# Patient Record
Sex: Female | Born: 1955 | Race: Black or African American | Hispanic: No | Smoking: Former smoker
Health system: Southern US, Community
[De-identification: ages and names within clinical notes are randomized; demographics above are authoritative.]

## PROBLEM LIST (undated history)

## (undated) DIAGNOSIS — Z9289 Personal history of other medical treatment: Secondary | ICD-10-CM

## (undated) DIAGNOSIS — H269 Unspecified cataract: Secondary | ICD-10-CM

## (undated) DIAGNOSIS — Z803 Family history of malignant neoplasm of breast: Secondary | ICD-10-CM

## (undated) DIAGNOSIS — I10 Essential (primary) hypertension: Secondary | ICD-10-CM

## (undated) DIAGNOSIS — Z972 Presence of dental prosthetic device (complete) (partial): Secondary | ICD-10-CM

## (undated) DIAGNOSIS — H409 Unspecified glaucoma: Secondary | ICD-10-CM

## (undated) DIAGNOSIS — Z923 Personal history of irradiation: Secondary | ICD-10-CM

## (undated) DIAGNOSIS — D649 Anemia, unspecified: Secondary | ICD-10-CM

## (undated) DIAGNOSIS — E7800 Pure hypercholesterolemia, unspecified: Secondary | ICD-10-CM

## (undated) DIAGNOSIS — D352 Benign neoplasm of pituitary gland: Secondary | ICD-10-CM

## (undated) DIAGNOSIS — Z9221 Personal history of antineoplastic chemotherapy: Secondary | ICD-10-CM

## (undated) DIAGNOSIS — C50919 Malignant neoplasm of unspecified site of unspecified female breast: Secondary | ICD-10-CM

## (undated) DIAGNOSIS — Z973 Presence of spectacles and contact lenses: Secondary | ICD-10-CM

## (undated) HISTORY — PX: TONSILLECTOMY: SUR1361

## (undated) HISTORY — DX: Malignant neoplasm of unspecified site of unspecified female breast: C50.919

## (undated) HISTORY — PX: MULTIPLE TOOTH EXTRACTIONS: SHX2053

## (undated) HISTORY — DX: Personal history of irradiation: Z92.3

## (undated) HISTORY — DX: Family history of malignant neoplasm of breast: Z80.3

## (undated) HISTORY — DX: Pure hypercholesterolemia, unspecified: E78.00

## (undated) HISTORY — PX: WISDOM TOOTH EXTRACTION: SHX21

## (undated) HISTORY — PX: PORT-A-CATH REMOVAL: SHX5289

## (undated) HISTORY — PX: BREAST EXCISIONAL BIOPSY: SUR124

## (undated) HISTORY — DX: Essential (primary) hypertension: I10

---
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
## 1991-09-09 HISTORY — PX: ABDOMINAL HYSTERECTOMY: SHX81

## 1999-08-16 ENCOUNTER — Encounter (INDEPENDENT_AMBULATORY_CARE_PROVIDER_SITE_OTHER): Payer: Self-pay | Admitting: *Deleted

## 1999-08-16 ENCOUNTER — Ambulatory Visit (HOSPITAL_BASED_OUTPATIENT_CLINIC_OR_DEPARTMENT_OTHER): Admission: RE | Admit: 1999-08-16 | Discharge: 1999-08-16 | Payer: Self-pay | Admitting: General Surgery

## 2000-03-16 ENCOUNTER — Encounter: Admission: RE | Admit: 2000-03-16 | Discharge: 2000-06-14 | Payer: Self-pay | Admitting: Family Medicine

## 2002-12-23 ENCOUNTER — Encounter: Admission: RE | Admit: 2002-12-23 | Discharge: 2002-12-23 | Payer: Self-pay | Admitting: Family Medicine

## 2002-12-23 ENCOUNTER — Encounter: Payer: Self-pay | Admitting: Family Medicine

## 2003-01-09 ENCOUNTER — Encounter: Admission: RE | Admit: 2003-01-09 | Discharge: 2003-01-09 | Payer: Self-pay | Admitting: Family Medicine

## 2003-01-09 ENCOUNTER — Encounter: Payer: Self-pay | Admitting: Family Medicine

## 2004-04-05 ENCOUNTER — Encounter: Admission: RE | Admit: 2004-04-05 | Discharge: 2004-04-05 | Payer: Self-pay | Admitting: Family Medicine

## 2004-09-08 HISTORY — PX: TRANSPHENOIDAL PITUITARY RESECTION: SHX2572

## 2005-06-26 ENCOUNTER — Encounter: Payer: Self-pay | Admitting: *Deleted

## 2005-06-27 ENCOUNTER — Encounter: Payer: Self-pay | Admitting: *Deleted

## 2005-09-05 ENCOUNTER — Encounter (INDEPENDENT_AMBULATORY_CARE_PROVIDER_SITE_OTHER): Payer: Self-pay | Admitting: *Deleted

## 2005-09-05 ENCOUNTER — Inpatient Hospital Stay (HOSPITAL_COMMUNITY): Admission: RE | Admit: 2005-09-05 | Discharge: 2005-09-10 | Payer: Self-pay | Admitting: Neurosurgery

## 2005-12-09 ENCOUNTER — Emergency Department (HOSPITAL_COMMUNITY): Admission: EM | Admit: 2005-12-09 | Discharge: 2005-12-10 | Payer: Self-pay | Admitting: Emergency Medicine

## 2007-06-02 ENCOUNTER — Encounter: Admission: RE | Admit: 2007-06-02 | Discharge: 2007-06-02 | Payer: Self-pay | Admitting: Family Medicine

## 2008-07-31 ENCOUNTER — Encounter: Admission: RE | Admit: 2008-07-31 | Discharge: 2008-07-31 | Payer: Self-pay | Admitting: Family Medicine

## 2009-08-07 ENCOUNTER — Encounter: Admission: RE | Admit: 2009-08-07 | Discharge: 2009-08-07 | Payer: Self-pay | Admitting: Family Medicine

## 2009-09-20 ENCOUNTER — Encounter: Admission: RE | Admit: 2009-09-20 | Discharge: 2009-09-20 | Payer: Self-pay | Admitting: Family Medicine

## 2010-08-08 ENCOUNTER — Encounter: Admission: RE | Admit: 2010-08-08 | Discharge: 2010-08-08 | Payer: Self-pay | Admitting: Family Medicine

## 2010-09-29 ENCOUNTER — Encounter: Payer: Self-pay | Admitting: Neurosurgery

## 2010-10-28 ENCOUNTER — Encounter: Payer: Self-pay | Admitting: Cardiovascular Disease

## 2010-10-28 DIAGNOSIS — E119 Type 2 diabetes mellitus without complications: Secondary | ICD-10-CM | POA: Insufficient documentation

## 2010-10-28 DIAGNOSIS — E785 Hyperlipidemia, unspecified: Secondary | ICD-10-CM | POA: Insufficient documentation

## 2010-10-28 DIAGNOSIS — I10 Essential (primary) hypertension: Secondary | ICD-10-CM | POA: Insufficient documentation

## 2010-12-05 ENCOUNTER — Institutional Professional Consult (permissible substitution): Payer: Self-pay | Admitting: Cardiovascular Disease

## 2011-01-24 NOTE — Op Note (Signed)
[facility]  Patient:    [name]                     MRN: [id number] Proc. Date: 08/16/99 Adm. Date:  [id number] Attending:  Glenna Fellows Tappan                           Operative Report  PREOPERATIVE DIAGNOSIS: Left breast mass.  POSTOPERATIVE DIAGNOSIS: Left breast mass.  PROCEDURE: Excisional biopsy, left breast mass.  SURGEON: Lorne Skeens. Hoxworth, M.D.  ANESTHESIA: Local with IV sedation.  INDICATIONS: Ms. [name] is a 55 year old black female who recently noted a mass n the medial left breast.  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  Excisional biopsy has been recommended and accepted.  The nature of the procedure, its indications and risks of bleeding and infection were discussed and understood preoperatively.  She is now brought to the operating room for this procedure.  DESCRIPTION OF PROCEDURE:  The patient was brought to the operating room and placed in supine position on the operating table.  IV sedation was administered.  The eft breast was sterilely prepped and draped.  Local anesthesia was used to infiltrate the skin and underlying soft tissue.  A curvilinear incision was made directly ver the mass and dissection was carried down through the subcutaneous tissue.  The ass was then completely sharply excised and grossly appeared to be a fibroadenoma.  Hemostasis was obtained with cautery and the wound was closed with a running subcuticular 4-0 monocryl and Steri-Strips.  Sponge, needle and instrument counts were correct.  A dry sterile dressing was applied and the patient was taken to he recovery room in good condition. DD:  08/16/99 TD:  08/18/99 Job: [id number]

## 2011-01-24 NOTE — H&P (Signed)
NAMEMAYLEY, [name]             ACCOUNT NO.:  [id number]   MEDICAL RECORD NO.:  [id number]          PATIENT TYPE:  INP   LOCATION:  2857                         FACILITY:  [facility]   PHYSICIAN:  Payton Doughty, M.D.      DATE OF BIRTH:  [birth year]   DATE OF ADMISSION:  09/05/2005  DATE OF DISCHARGE:                                HISTORY & PHYSICAL   ADMISSION DIAGNOSIS:  Pituitary tumor.   HISTORY:  This is a very nice 55 year old, right-handed black girl,  experiencing some blurriness of the left eye.  Intraocular pressure is up.  An MR demonstrated a fairly large pituitary mass eccentric to the left.  She  has not had an endocrine evaluation.  She did not have any endocrine  complaints.  She is admitted now for a trans-sphenoidal pituitary resection.  Her prolactin was about 35.   PAST MEDICAL HISTORY:  Is otherwise benign.  She is controlling early  diabetes with diet.   MEDICATIONS:  1.  Zestoretic daily.  2.  Lipitor.   ALLERGIES:  No known drug allergies.   PAST SURGICAL HISTORY:  A hysterectomy 10 years ago.   SOCIAL HISTORY:  She does not smoke.  Drinks on a limited social basis.  She  is a Librarian, academic.   FAMILY HISTORY:  Mom is age 29, in good health with hypertension and type 2  diabetes.  Dad is ae 56, with hypertension.   REVIEW OF SYSTEMS:  Remarkable for glasses.   PHYSICAL EXAMINATION:  HEENT:  Within normal limits.  NECK:  She has a reasonable range of motion in her neck.  CHEST:  Clear.  HEART:  A regular rate and rhythm.  ABDOMEN:  Nontender.  No hepatosplenomegaly.  EXTREMITIES:  Without clubbing or cyanosis.  Peripheral pulses are good.  GENITOURINARY:  Exam is deferred.  NEUROLOGIC:  She is awake, alert and oriented and very pleasant.  Her visual  fields are full to confrontational testing.  Pupils equal, round, reactive  to light.  Extraocular movements are intact.  Facial movement and sensation  intact.  Tongue protrudes in the midline.   Shoulder shrug is normal.  Describes some swallowing difficulties.  Motor exam is full with no pronator  drift.  Finger-to-nose-to-finger and finger apposition are performed  satisfactorily.  Gait is normal.   MR shows a somewhat heterogeneously-enhancing mass centered in the sella and  extending to the supra-sellar region off to the left side.   CLINICAL IMPRESSION:  Likely a pituitary tumor.   PLAN:  A trans-sphenoidal resection of the pituitary tumor.  The risks and  benefits of this approach have been discussed with her and she wishes to  proceed.           ______________________________  Payton Doughty, M.D.     MWR/MEDQ  D:  09/05/2005  T:  09/05/2005  Job:  [id number]

## 2011-01-24 NOTE — Op Note (Signed)
[name], [name]             ACCOUNT NO.:  [id number]   MEDICAL RECORD NO.:  [id number]          PATIENT TYPE:  INP   LOCATION:  2857                         FACILITY:  [facility]   PHYSICIAN:  Payton Doughty, M.D.      DATE OF BIRTH:  [birth year]   DATE OF PROCEDURE:  09/05/2005  DATE OF DISCHARGE:                                 OPERATIVE REPORT   PREOPERATIVE DIAGNOSIS:  Pituitary tumor.   POSTOPERATIVE DIAGNOSIS:  Pituitary tumor.   OPERATIVE PROCEDURE:  Trans-sphenoidal pituitary tumor resection.   SURGEON:  Payton Doughty, M.D.   SERVICE:  Neurosurgery.   ANESTHESIA:  General endotracheal anesthesia.   PREPARATION:  Betadine scrub and alcohol wipe.   BODY OF TEXT:  This is a 55 year old black female with a pituitary tumor,  taken to the operating room, smoothly anesthetized and intubated, placed in  the supine position with the head in the [place] head holder.  Dr. Pollyann Kennedy of  ENT performed the trans-nasal approach to the sella and he will dictate that  under separate cover.  The operating microscope was brought in.  The sella  was immediately obvious.  The bone in the sella was removed with a curet.  This exposed the dura of the pituitary.  It was opened after coagulation and  immediately obvious was a very large soft tumor.  It was suctioned without  difficulty.  Numerous pieces were sent for permanent section.  Gentle  curettage along the surface loosened the tumor and it was removed.  The  diaphragmatic sella was seen in apposition and the final bit was removed.  The normal gland was not seen.  The wound was irrigated, hemostasis was  assured, and closed.           ______________________________  Payton Doughty, M.D.     MWR/MEDQ  D:  09/05/2005  T:  09/05/2005  Job:  [id number]

## 2011-01-24 NOTE — Op Note (Signed)
[name], [name]             ACCOUNT NO.:  [id number]   MEDICAL RECORD NO.:  [id number]          PATIENT TYPE:  INP   LOCATION:  2857                         FACILITY:  [facility]   PHYSICIAN:  Jefry H. Pollyann Kennedy, MD     DATE OF BIRTH:  1956-02-11   DATE OF PROCEDURE:  09/05/2005  DATE OF DISCHARGE:                                 OPERATIVE REPORT   PREOPERATIVE DIAGNOSIS:  Pituitary tumor.   POSTOPERATIVE DIAGNOSIS:  Pituitary tumor.   PROCEDURE:  Transsphenoidal approach to the pituitary in conjunction with  Dr. Trey Sailors of neurosurgery for resection of tumor.   HISTORY:  A 55 year old lady who was found recently on imaging to have an  incidental pituitary tumor that was fairly large. Her main complaint at the  time was visual blurriness. Risks, benefits, alternatives and complications  of this portion of the procedure were explained to the patient who seemed to  understand and agreed to surgery.   PROCEDURE:  The patient was taken to the operating room, placed on the  operating table in spine position. Following induction of general  endotracheal anesthesia, the head was positioned with neurosurgical halo  pins and prepped and draped in standard fashion. Oxymetazoline spray was  used preoperatively in nasal cavities. Xylocaine 1% with epinephrine was  infiltrated into the septum and the columella. Afrin soaked pledgets were  placed in the nasal cavities bilaterally. A columellar incision was created  with a #11 scalpel and connected into the left nasal cavity to a  hemitransfixion incision. Mucoperichondrial flap was then developed  posteriorly to the sphenoid rostrum. The bony cartilaginous junction was  divided and the cartilaginous septum was dislocated from the maxillary crest  and brought over to the right side in conjunction with the columella. The  mucosal flap was then developed on the right side of the septal bone. The  entire septal bone was resected back to the space  of the sphenoid. A 4 mm  osteotome was used to open into the center of the sphenoid sinus. Up-biting  and down-biting Kerrison rongeurs were then used to enlarge the sphenoid  sinus to give good visualization of the base of the sella. The intrasinus  septum was deviated severely over to the right side and this was followed  all the way back to the posterior wall of the sinus. The C-arm was brought  in and used to evaluate the adequacy of the exposure which was felt to be  good. Microscope was brought into view and Dr. Channing Mutters performed his portion of  the procedure. Following that, a fragment of septal bone was used to  reconstruct the defect in the posterior wall of the sphenoid. The sphenoid  was then packed with abdominal fat that was harvested through a separate  incision. The septal incisions were reapproximated with 3-0 chromic suture.  The columellar incision was reapproximated with interrupted 6-0 nylon. The  septal flaps were quilted with plain gut 4-0. Nasal cavities were packed  with rolled up Telfa coated with  bacitracin ointment. Pharynx was suctioned of blood and secretions. The  abdominal incision was reapproximated  with subcuticular Vicryl sutures and  then Steri-Strips were applied on Benzoin. The patient was then awakened  from anesthesia, extubated and transferred to recovery in stable condition.      Jefry H. Pollyann Kennedy, MD  Electronically Signed     JHR/MEDQ  D:  09/05/2005  T:  09/05/2005  Job:  [id number]   cc:   Talmadge Coventry, M.D.  Fax: [phone]

## 2011-01-24 NOTE — Discharge Summary (Signed)
[name], KE             ACCOUNT NO.:  [id number]   MEDICAL RECORD NO.:  [id number]          PATIENT TYPE:  INP   LOCATION:  3013                         FACILITY:  [facility]   PHYSICIAN:  Payton Doughty, M.D.      DATE OF BIRTH:  02-May-1956   DATE OF ADMISSION:  09/05/2005  DATE OF DISCHARGE:                                 DISCHARGE SUMMARY   ADMITTING DIAGNOSIS:  Pituitary tumor.   DISCHARGE DIAGNOSIS:  Pituitary tumor.   OPERATIVE PROCEDURE:  Transsphenoidal pituitary tumor resection.   SERVICE:  Neurosurgery.   COMPLICATIONS:  None.   DISCHARGING STATUS:  Alive and well.   The 56 year old right-handed black lady's history and physical is recounted  in the chart. She had a pituitary tumor discovered after intraocular  pressures were observed to be elevated. General exam is intact, neurologic  exam is intact, visual fields are full. She was admitted after ascertaining  normal laboratory values and underwent a transsphenoidal pituitary tumor  resection. Postoperatively she has been hypertensive and was controlled with  labetalol and hydralazine. She got one dose of hydrochlorothiazide which did  result in lowering of her blood pressure. However, because of concerns of  confusing the diuresis with diabetes insipidus, it was stopped. Currently  her blood pressure is slightly elevated. She is awake, alert, and oriented.  Her cranial nerves are intact, vision is full. She is going to be discharged  home on hydrocortisone 10 mg b.i.d., Vicodin on a p.r.n. basis, and  hydrochlorothiazide as well as lisinopril for her blood pressure. She is  supposed to follow up with Dr. Talmage Nap on January 19. She will be back in my  office in about 2 weeks.           ______________________________  Payton Doughty, M.D.     MWR/MEDQ  D:  09/10/2005  T:  09/10/2005  Job:  [id number]

## 2011-08-13 ENCOUNTER — Other Ambulatory Visit: Payer: Self-pay | Admitting: Family Medicine

## 2011-08-13 DIAGNOSIS — Z1231 Encounter for screening mammogram for malignant neoplasm of breast: Secondary | ICD-10-CM

## 2011-08-27 ENCOUNTER — Ambulatory Visit
Admission: RE | Admit: 2011-08-27 | Discharge: 2011-08-27 | Disposition: A | Discharge status: Home / Self Care | Payer: BC Managed Care – PPO | Source: Ambulatory Visit | Attending: Family Medicine | Admitting: Family Medicine

## 2011-08-27 DIAGNOSIS — Z1231 Encounter for screening mammogram for malignant neoplasm of breast: Secondary | ICD-10-CM

## 2011-10-22 DIAGNOSIS — E119 Type 2 diabetes mellitus without complications: Secondary | ICD-10-CM | POA: Insufficient documentation

## 2011-10-22 DIAGNOSIS — E785 Hyperlipidemia, unspecified: Secondary | ICD-10-CM | POA: Insufficient documentation

## 2011-10-22 DIAGNOSIS — I10 Essential (primary) hypertension: Secondary | ICD-10-CM | POA: Insufficient documentation

## 2012-08-26 ENCOUNTER — Other Ambulatory Visit: Payer: Self-pay | Admitting: Family Medicine

## 2012-08-26 DIAGNOSIS — Z1231 Encounter for screening mammogram for malignant neoplasm of breast: Secondary | ICD-10-CM

## 2012-10-01 ENCOUNTER — Ambulatory Visit
Admission: RE | Admit: 2012-10-01 | Discharge: 2012-10-01 | Disposition: A | Discharge status: Home / Self Care | Payer: BC Managed Care – PPO | Source: Ambulatory Visit | Attending: Family Medicine | Admitting: Family Medicine

## 2012-10-01 DIAGNOSIS — Z1231 Encounter for screening mammogram for malignant neoplasm of breast: Secondary | ICD-10-CM

## 2013-09-08 DIAGNOSIS — Z9289 Personal history of other medical treatment: Secondary | ICD-10-CM

## 2013-09-08 HISTORY — DX: Personal history of other medical treatment: Z92.89

## 2013-10-07 ENCOUNTER — Other Ambulatory Visit: Payer: Self-pay

## 2013-10-07 DIAGNOSIS — Z1231 Encounter for screening mammogram for malignant neoplasm of breast: Secondary | ICD-10-CM

## 2013-10-21 ENCOUNTER — Ambulatory Visit: Payer: BC Managed Care – PPO

## 2013-11-04 ENCOUNTER — Ambulatory Visit: Payer: BC Managed Care – PPO

## 2013-11-22 ENCOUNTER — Ambulatory Visit: Payer: BC Managed Care – PPO

## 2013-12-08 ENCOUNTER — Ambulatory Visit
Admission: RE | Admit: 2013-12-08 | Discharge: 2013-12-08 | Disposition: A | Discharge status: Home / Self Care | Payer: BC Managed Care – PPO | Source: Ambulatory Visit

## 2013-12-08 DIAGNOSIS — Z1231 Encounter for screening mammogram for malignant neoplasm of breast: Secondary | ICD-10-CM

## 2013-12-09 ENCOUNTER — Other Ambulatory Visit: Payer: Self-pay | Admitting: Family Medicine

## 2013-12-09 DIAGNOSIS — R928 Other abnormal and inconclusive findings on diagnostic imaging of breast: Secondary | ICD-10-CM

## 2013-12-20 ENCOUNTER — Other Ambulatory Visit: Payer: Self-pay | Admitting: Family Medicine

## 2013-12-20 ENCOUNTER — Ambulatory Visit
Admission: RE | Admit: 2013-12-20 | Discharge: 2013-12-20 | Disposition: A | Discharge status: Home / Self Care | Payer: BC Managed Care – PPO | Source: Ambulatory Visit | Attending: Family Medicine | Admitting: Family Medicine

## 2013-12-20 DIAGNOSIS — R928 Other abnormal and inconclusive findings on diagnostic imaging of breast: Secondary | ICD-10-CM

## 2013-12-29 ENCOUNTER — Other Ambulatory Visit: Payer: Self-pay | Admitting: Family Medicine

## 2013-12-29 ENCOUNTER — Encounter (INDEPENDENT_AMBULATORY_CARE_PROVIDER_SITE_OTHER): Payer: Self-pay

## 2013-12-29 ENCOUNTER — Ambulatory Visit
Admission: RE | Admit: 2013-12-29 | Discharge: 2013-12-29 | Disposition: A | Discharge status: Home / Self Care | Payer: BC Managed Care – PPO | Source: Ambulatory Visit | Attending: Family Medicine | Admitting: Family Medicine

## 2013-12-29 ENCOUNTER — Other Ambulatory Visit: Payer: BC Managed Care – PPO

## 2013-12-29 DIAGNOSIS — C50919 Malignant neoplasm of unspecified site of unspecified female breast: Secondary | ICD-10-CM

## 2013-12-29 DIAGNOSIS — R928 Other abnormal and inconclusive findings on diagnostic imaging of breast: Secondary | ICD-10-CM

## 2013-12-29 HISTORY — PX: BREAST BIOPSY: SHX20

## 2013-12-29 HISTORY — DX: Malignant neoplasm of unspecified site of unspecified female breast: C50.919

## 2013-12-30 ENCOUNTER — Ambulatory Visit
Admission: RE | Admit: 2013-12-30 | Discharge: 2013-12-30 | Disposition: A | Discharge status: Home / Self Care | Payer: BC Managed Care – PPO | Source: Ambulatory Visit | Attending: Family Medicine | Admitting: Family Medicine

## 2013-12-30 DIAGNOSIS — R928 Other abnormal and inconclusive findings on diagnostic imaging of breast: Secondary | ICD-10-CM

## 2014-01-10 ENCOUNTER — Encounter (INDEPENDENT_AMBULATORY_CARE_PROVIDER_SITE_OTHER): Payer: Self-pay | Admitting: General Surgery

## 2014-01-10 ENCOUNTER — Ambulatory Visit (INDEPENDENT_AMBULATORY_CARE_PROVIDER_SITE_OTHER): Payer: BC Managed Care – PPO | Admitting: General Surgery

## 2014-01-10 VITALS — BP 138/82 | HR 80 | Temp 98.3°F | Resp 16 | Ht 65.0 in | Wt 241.0 lb

## 2014-01-10 DIAGNOSIS — C50519 Malignant neoplasm of lower-outer quadrant of unspecified female breast: Secondary | ICD-10-CM

## 2014-01-10 DIAGNOSIS — C50511 Malignant neoplasm of lower-outer quadrant of right female breast: Secondary | ICD-10-CM

## 2014-01-10 NOTE — Progress Notes (Signed)
Patient ID: [name], female   DOB: 27-Oct-1955, 58 y.o.   MRN: [id number]  Chief Complaint  Patient presents with  . eval rigth breast    HPI [name] is a 58 y.o. female.  Referred by Dr Lovey Newcomer HPI 51 yof who is has no prior breast history and underwent routine screening mammogram with right breast abnormality. She has also undergone ultrasound now and there is a 1.4 x 1.3 x 1.2 cm mass with no right axillary adenopathy.  She has undergone biopsy that shows grade III invasive ductal carcinoma, triple negative.  She reports no breast complaints.  She has family history in her mother at age 42 and an aunt in her 52s.    Past Medical History  Diagnosis Date  . HTN (hypertension)   . Diabetes mellitus   . High cholesterol     Past Surgical History  Procedure Laterality Date  . Abdominal hysterectomy    . Breast biopsy      LEFT  . Transphenoidal pituitary resection      Family History  Problem Relation Age of Onset  . Hypertension Mother   . Diabetes type II Mother   . Hypertension Father     Social History History  Substance Use Topics  . Smoking status: Former Smoker    Types: Cigarettes  . Smokeless tobacco: Not on file  . Alcohol Use: Yes    No Known Allergies  Current Outpatient Prescriptions  Medication Sig Dispense Refill  . amLODipine (NORVASC) 5 MG tablet       . Atorvastatin Calcium (LIPITOR PO) Take by mouth.        . glyBURIDE (DIABETA) 5 MG tablet Take 5 mg by mouth 2 (two) times daily.        Marland Kitchen lisinopril-hydrochlorothiazide (PRINZIDE,ZESTORETIC) 20-12.5 MG per tablet       . metFORMIN (GLUCOPHAGE-XR) 500 MG 24 hr tablet        No current facility-administered medications for this visit.    Review of Systems Review of Systems  Constitutional: Negative for fever, chills and unexpected weight change.  HENT: Negative for congestion, hearing loss, sore throat, trouble swallowing and voice change.   Eyes: Negative for visual  disturbance.  Respiratory: Negative for cough and wheezing.   Cardiovascular: Negative for chest pain, palpitations and leg swelling.  Gastrointestinal: Negative for nausea, vomiting, abdominal pain, diarrhea, constipation, blood in stool, abdominal distention and anal bleeding.  Genitourinary: Negative for hematuria, vaginal bleeding and difficulty urinating.  Musculoskeletal: Negative for arthralgias.  Skin: Negative for rash and wound.  Neurological: Negative for seizures, syncope and headaches.  Hematological: Negative for adenopathy. Does not bruise/bleed easily.  Psychiatric/Behavioral: Negative for confusion.    Blood pressure 138/82, pulse 80, temperature 98.3 F (36.8 C), resp. rate 16, height 5\' 5"  (1.651 m), weight 241 lb (109.317 kg).  Physical Exam Physical Exam  Vitals reviewed. Constitutional: She appears well-developed and well-nourished.  HENT:  Head: Normocephalic and atraumatic.  Eyes: No scleral icterus.  Cardiovascular: Normal rate, regular rhythm and normal heart sounds.   Pulmonary/Chest: Effort normal and breath sounds normal. She has no wheezes. She has no rales. Right breast exhibits no inverted nipple, no mass, no nipple discharge, no skin change and no tenderness. Left breast exhibits no inverted nipple, no mass, no nipple discharge, no skin change and no tenderness.    Lymphadenopathy:    She has no cervical adenopathy.    She has no axillary adenopathy.  Right: No supraclavicular adenopathy present.       Left: No supraclavicular adenopathy present.    Data Reviewed EXAM:  DIGITAL DIAGNOSTIC right MAMMOGRAM  ULTRASOUND right BREAST  COMPARISON: Prior exams  ACR Breast Density Category d: The breast tissue is extremely dense,  which lowers the sensitivity of mammography.  FINDINGS:  Additional views confirm the presence of a mildly irregular oval 1.3  cm right breast mass 6 o'clock location. This corresponds to the  questioned finding at  screening mammography.  On physical exam, I palpate no abnormality in the left breast 6  o'clock location.  Ultrasound is performed, showing an irregular shadowing hypoechoic  mass in the right breast 6 o'clock location 8 cm from the nipple  measuring 1.4 x 1.3 x 1.2 cm. No right axillary lymphadenopathy is  identified. This corresponds to the mammographic finding.  IMPRESSION:  Suspicious right breast mass, 6 o'clock location. Ultrasound-guided  biopsy will be scheduled at the patient's convenience and dictated  separately.   Assessment    Clinical stage I TNBC     Plan    Genetic testing, MRI ( I think reasonable given tnbc and possible primary systemic therapy), med onc appt  We discussed the staging and pathophysiology of breast cancer. We discussed all of the different options for treatment for breast cancer including surgery, chemotherapy, radiation therapy, Herceptin, and antiestrogen therapy. We discussed her pathology at length.  We discussed possibility of chemo first.  We discussed pros and cons of breast MR and have decided to proceed. Will also get her to see med onc up front and discuss at conference  We discussed a sentinel lymph node biopsy as she does not appear to having lymph node involvement right now. We discussed the performance of that with injection of radioactive tracer and blue dye. We discussed that she would have an incision underneath her axillary hairline if she undergoes lumpectomy. We discussed that there is a chance of having a positive node with a sentinel lymph node biopsy and we will await the permanent pathology to make any other first further decisions in terms of her treatment. One of these options might be to return to the operating room to perform an axillary lymph node dissection. We discussed up to a 5% risk lifetime of chronic shoulder pain as well as lymphedema associated with a sentinel lymph node biopsy.  We discussed the options for treatment of  the breast cancer which included lumpectomy versus a mastectomy. We discussed the performance of the lumpectomy with seed placement. We discussed a 10% chance of a positive margin requiring reexcision in the operating room. We also discussed that she may need radiation therapy or antiestrogen therapy or both if she undergoes lumpectomy. We discussed the mastectomy and the postoperative care for that as well. We discussed that there is no difference in her survival whether she undergoes lumpectomy with radiation therapy or antiestrogen therapy versus a mastectomy.  I do think she will need chemo so will wait for her to see med onc to discuss port placement.  Will see her back after above.         Rolm Bookbinder 01/10/2014, 3:56 PM

## 2014-01-11 ENCOUNTER — Telehealth: Payer: Self-pay | Admitting: *Deleted

## 2014-01-11 NOTE — Telephone Encounter (Signed)
Called pt and confirmed 01/12/14 genetic appt & 01/18/14 med onc appt.  Mailed before appt letter, welcoming packet & intake form to pt.  Emailed Anderson Malta, Lars Mage & Dr. Donne Hazel at [place] to make them aware.

## 2014-01-12 ENCOUNTER — Ambulatory Visit
Admission: RE | Admit: 2014-01-12 | Discharge: 2014-01-12 | Disposition: A | Discharge status: Home / Self Care | Payer: BC Managed Care – PPO | Source: Ambulatory Visit | Attending: General Surgery | Admitting: General Surgery

## 2014-01-12 ENCOUNTER — Encounter: Payer: Self-pay | Admitting: Genetic Counselor

## 2014-01-12 ENCOUNTER — Other Ambulatory Visit: Payer: BC Managed Care – PPO

## 2014-01-12 ENCOUNTER — Ambulatory Visit (HOSPITAL_BASED_OUTPATIENT_CLINIC_OR_DEPARTMENT_OTHER): Payer: BC Managed Care – PPO | Admitting: Genetic Counselor

## 2014-01-12 DIAGNOSIS — C50919 Malignant neoplasm of unspecified site of unspecified female breast: Secondary | ICD-10-CM

## 2014-01-12 DIAGNOSIS — Z809 Family history of malignant neoplasm, unspecified: Secondary | ICD-10-CM

## 2014-01-12 DIAGNOSIS — C50511 Malignant neoplasm of lower-outer quadrant of right female breast: Secondary | ICD-10-CM

## 2014-01-12 DIAGNOSIS — Z8041 Family history of malignant neoplasm of ovary: Secondary | ICD-10-CM

## 2014-01-12 DIAGNOSIS — Z803 Family history of malignant neoplasm of breast: Secondary | ICD-10-CM | POA: Insufficient documentation

## 2014-01-12 MED ORDER — GADOBENATE DIMEGLUMINE 529 MG/ML IV SOLN
20.0000 mL | Freq: Once | INTRAVENOUS | Status: AC | PRN
  Administered 2014-01-12: 20 mL via INTRAVENOUS

## 2014-01-12 NOTE — Progress Notes (Signed)
Patient Name: [name] Patient Age: 58 y.o. Encounter Date: 01/12/2014  Referring Physician: MATTHEW WAKEFIELD, MD  Primary Care Provider: BADGER,MICHAEL C, MD   Ms. [name], a 58 y.o. female, [facility] due to a personal and family history of breast cancer.  She presents to clinic today to discuss the possibility of a hereditary predisposition to cancer and discuss whether genetic testing is warranted.  HISTORY OF PRESENT ILLNESS: [name] was diagnosed with breast cancer (IDC) recently at the age of 58. The breast tumor was ER/PR/HER2 negative. She states she will likely be receiving chemotherapy in the neoadjuvant setting, but has not yet met with the medical oncologist.  She has no other history of cancer. She reports that she has never had a colonoscopy and has not seen a gynecologist for about 9 years. She had a TAH/BSO at age 36 due to endometriosis.  Past Medical History  Diagnosis Date  . HTN (hypertension)   . Diabetes mellitus   . High cholesterol   . Breast cancer   . Family history of malignant neoplasm of breast     History   Social History  . Marital Status: Widowed    Spouse Name: N/A    Number of Children: N/A  . Years of Education: N/A   Social History Main Topics  . Smoking status: Former Smoker    Types: Cigarettes  . Smokeless tobacco: Not on file  . Alcohol Use: Yes  . Drug Use: No  . Sexual Activity: Not on file   Other Topics Concern  . Not on file   Social History Narrative  . No narrative on file     FAMILY HISTORY:   During the visit, a 4-generation pedigree was obtained. Significant diagnoses include the following:  Family History  Problem Relation Age of Onset  . Hypertension Mother   . Diabetes type II Mother   . Breast cancer Mother     dx ~72; deceased 77  . Hypertension Father   . Breast cancer Maternal Aunt     deceased 42  . Cancer Maternal Uncle     unk. type;  deceased 70s  . Cancer Maternal Uncle     unk. type; deceased late 60s  . Ovarian cancer Cousin 49    daughter of an unaffected mat aunt who is 83    Of note, [name]'s mother was one of 12 children. Of her mother's 3 sisters, only one had breast cancer as noted above. She has numerous cousins and the one with reported ovarian cancer is related through an unaffected maternal aunt. [name]'s father was an only child.  [name]'s ancestry is African American. There is no known Jewish ancestry and no consanguinity.  ASSESSMENT AND PLAN: [name] is a 58 y.o. female with a personal and family history of breast cancer. This history is not highly suggestive of a hereditary predisposition to cancer given her very large family, however, genetic testing is recommended due to her triple negative breast cancer and that her father had no sisters. She also had a TAH/BSO at a young age which may play a role in age of onset for her. We reviewed the characteristics, features and inheritance patterns of hereditary cancer syndromes. We also discussed genetic testing, including the appropriate family members to test, the process of testing, insurance coverage and implications of results.   [name] wished to pursue genetic testing and a blood sample will be   sent to [facility] for analysis of 6 genes (BRCA1, BRCA2, CDH1, PALB2, PTEN and p53). We discussed the implications of a positive, negative and/ or Variant of Uncertain Significance (VUS) result. Results should be available in approximately 2 weeks for 5 of the genes and an additional 2 weeks for PALB2. We will call her with each result and address implications for her as well as address genetic testing for at-risk family members, if needed.    We encouraged [name] to remain in contact with [facility] annually so that we can update the family history and inform her of any changes in cancer genetics and testing that may  be of benefit for this family. Ms.  Owens questions were answered to her satisfaction today.   Thank you for the referral and allowing [place] to share in the care of your patient.   The patient was seen for a total of 30 minutes, greater than 50% of which was spent face-to-face counseling. This patient was discussed with the referring provider who agrees with the above.

## 2014-01-16 ENCOUNTER — Other Ambulatory Visit (INDEPENDENT_AMBULATORY_CARE_PROVIDER_SITE_OTHER): Payer: Self-pay | Admitting: General Surgery

## 2014-01-16 ENCOUNTER — Other Ambulatory Visit: Payer: Self-pay | Admitting: *Deleted

## 2014-01-16 DIAGNOSIS — C50519 Malignant neoplasm of lower-outer quadrant of unspecified female breast: Secondary | ICD-10-CM

## 2014-01-16 DIAGNOSIS — R928 Other abnormal and inconclusive findings on diagnostic imaging of breast: Secondary | ICD-10-CM

## 2014-01-17 ENCOUNTER — Encounter: Payer: Self-pay | Admitting: *Deleted

## 2014-01-17 NOTE — Progress Notes (Signed)
Completed chart, labs entered, added to spreadsheet & placed in Dr. Magrinat's box.  

## 2014-01-18 ENCOUNTER — Other Ambulatory Visit: Payer: BC Managed Care – PPO

## 2014-01-18 ENCOUNTER — Encounter: Payer: Self-pay | Admitting: Oncology

## 2014-01-18 ENCOUNTER — Other Ambulatory Visit: Payer: Self-pay | Admitting: *Deleted

## 2014-01-18 ENCOUNTER — Ambulatory Visit (HOSPITAL_BASED_OUTPATIENT_CLINIC_OR_DEPARTMENT_OTHER): Payer: BC Managed Care – PPO | Admitting: Oncology

## 2014-01-18 ENCOUNTER — Ambulatory Visit: Payer: BC Managed Care – PPO

## 2014-01-18 ENCOUNTER — Other Ambulatory Visit (HOSPITAL_BASED_OUTPATIENT_CLINIC_OR_DEPARTMENT_OTHER): Payer: BC Managed Care – PPO

## 2014-01-18 ENCOUNTER — Telehealth: Payer: Self-pay | Admitting: Oncology

## 2014-01-18 VITALS — BP 215/107 | HR 108 | Temp 98.4°F | Resp 20 | Ht 65.0 in | Wt 242.4 lb

## 2014-01-18 DIAGNOSIS — C50919 Malignant neoplasm of unspecified site of unspecified female breast: Secondary | ICD-10-CM

## 2014-01-18 DIAGNOSIS — C50519 Malignant neoplasm of lower-outer quadrant of unspecified female breast: Secondary | ICD-10-CM

## 2014-01-18 DIAGNOSIS — E7800 Pure hypercholesterolemia, unspecified: Secondary | ICD-10-CM

## 2014-01-18 DIAGNOSIS — Z170 Estrogen receptor positive status [ER+]: Secondary | ICD-10-CM

## 2014-01-18 DIAGNOSIS — I10 Essential (primary) hypertension: Secondary | ICD-10-CM

## 2014-01-18 DIAGNOSIS — E119 Type 2 diabetes mellitus without complications: Secondary | ICD-10-CM

## 2014-01-18 DIAGNOSIS — C50511 Malignant neoplasm of lower-outer quadrant of right female breast: Secondary | ICD-10-CM

## 2014-01-18 DIAGNOSIS — Z803 Family history of malignant neoplasm of breast: Secondary | ICD-10-CM

## 2014-01-18 DIAGNOSIS — Z87891 Personal history of nicotine dependence: Secondary | ICD-10-CM

## 2014-01-18 LAB — CBC WITH DIFFERENTIAL/PLATELET
BASO%: 0.3 % (ref 0.0–2.0)
Basophils Absolute: 0 10*3/uL (ref 0.0–0.1)
EOS%: 2.2 % (ref 0.0–7.0)
Eosinophils Absolute: 0.2 10*3/uL (ref 0.0–0.5)
HEMATOCRIT: 35 % (ref 34.8–46.6)
HGB: 11 g/dL — ABNORMAL LOW (ref 11.6–15.9)
LYMPH#: 2.1 10*3/uL (ref 0.9–3.3)
LYMPH%: 19.2 % (ref 14.0–49.7)
MCH: 26 pg (ref 25.1–34.0)
MCHC: 31.4 g/dL — ABNORMAL LOW (ref 31.5–36.0)
MCV: 82.7 fL (ref 79.5–101.0)
MONO#: 0.5 10*3/uL (ref 0.1–0.9)
MONO%: 5 % (ref 0.0–14.0)
NEUT#: 7.9 10*3/uL — ABNORMAL HIGH (ref 1.5–6.5)
NEUT%: 73.3 % (ref 38.4–76.8)
Platelets: 261 10*3/uL (ref 145–400)
RBC: 4.23 10*6/uL (ref 3.70–5.45)
RDW: 16.3 % — AB (ref 11.2–14.5)
WBC: 10.8 10*3/uL — ABNORMAL HIGH (ref 3.9–10.3)

## 2014-01-18 LAB — COMPREHENSIVE METABOLIC PANEL (CC13)
ALBUMIN: 3.7 g/dL (ref 3.5–5.0)
ALT: 14 U/L (ref 0–55)
ANION GAP: 13 meq/L — AB (ref 3–11)
AST: 13 U/L (ref 5–34)
Alkaline Phosphatase: 106 U/L (ref 40–150)
BUN: 21.1 mg/dL (ref 7.0–26.0)
CO2: 23 meq/L (ref 22–29)
Calcium: 9.9 mg/dL (ref 8.4–10.4)
Chloride: 105 mEq/L (ref 98–109)
Creatinine: 1.2 mg/dL — ABNORMAL HIGH (ref 0.6–1.1)
GLUCOSE: 141 mg/dL — AB (ref 70–140)
Potassium: 3.9 mEq/L (ref 3.5–5.1)
Sodium: 142 mEq/L (ref 136–145)
Total Bilirubin: 0.35 mg/dL (ref 0.20–1.20)
Total Protein: 7.5 g/dL (ref 6.4–8.3)

## 2014-01-18 NOTE — Progress Notes (Signed)
Checked in new patient with no financial issues at this time-she has not seen the dr. She has appt card, alight info and has not been out of country. She will call me if any issues.

## 2014-01-18 NOTE — Progress Notes (Signed)
Per Dr. Jana Hakim, ultrasound and biopsy for pt scheduled on 5/14 were cancelled with [facility].

## 2014-01-18 NOTE — Telephone Encounter (Signed)
cld pt to adv of the appt time & date °

## 2014-01-18 NOTE — Progress Notes (Signed)
[place]  [phone] [phone]     ID: KARISHMA UNREIN OB: 1955-12-18  MR#: [id number]  IWO#:[id number]  PCP: Chesley Noon, MD GYN:   SU: Rolm Bookbinder OTHER MD:  CHIEF COMPLAINT: "They found cancer in my mammogram.)  BREAST CANCER HISTORY: The patient had routine bilateral screening mammography at the breast Center for 10/28/2013 showing a possible mass in the right breast. Additional views and right breast ultrasonography 12/20/2013 confirmed an irregular 1.3 cm right breast mass at the 6:00 position. This was not palpable. Ultrasound showed an irregular hypoechoic mass in the right breast measuring 1.4 cm. There was no right axillary adenopathy identified.  On 12/29/2013 the patient underwent right breast biopsy, showing ([id number]) and invasive ductal carcinoma, grade 3, estrogen and progesterone receptor negative, HER-2 not amplified with a signals ratio of 1.35 and a copy number per cell of 2.10, and an MIB-1 of 80%.  On 01/12/2014 the patient underwent bilateral breast MRI. This showed the mass in the right breast lower outer quadrant to measure 2.2 cm maximally. There was a 2 cm well circumscribed mass in the medial right breast which has been stable since 2011 him a and a 1 cm oval mass in the upper midline of the right breast, likely an intramammary lymph node, with benign features. A right axillary lymph node with cortical thickening measured 0.9 cm. The fatty hilum was possibly effaced; note Right axilla had been benign by [place] before biopsy. In the left breast there were no findings of concern.  The patient's subsequent history is as detailed below.  INTERVAL HISTORY: Ashlie presents to the breast clinic 01/18/2014 accompanied by her brother then. Her case was also presented at the multidisciplinary breast cancer conference 01/11/2014.  REVIEW OF SYSTEMS: There were no specific symptoms leading to the original mammogram, which  was routinely scheduled. The patient denies unusual headaches, visual changes, nausea, vomiting, stiff neck, dizziness, or gait imbalance. There has been no cough, phlegm production, or pleurisy, no chest pain or pressure, and no change in bowel or bladder habits. The patient denies fever, rash, bleeding, unexplained fatigue or unexplained weight loss. A detailed review of systems was otherwise entirely negative.  PAST MEDICAL HISTORY: Past Medical History  Diagnosis Date  . HTN (hypertension)   . Diabetes mellitus   . High cholesterol   . Breast cancer   . Family history of malignant neoplasm of breast     PAST SURGICAL HISTORY: Past Surgical History  Procedure Laterality Date  . Abdominal hysterectomy    . Breast biopsy      LEFT  . Transphenoidal pituitary resection      FAMILY HISTORY Family History  Problem Relation Age of Onset  . Hypertension Mother   . Diabetes type II Mother   . Breast cancer Mother     dx ~72; deceased 16  . Hypertension Father   . Breast cancer Maternal Aunt     deceased 31  . Cancer Maternal Uncle     unk. type; deceased 55s  . Cancer Maternal Uncle     unk. type; deceased late 3s  . Ovarian cancer Cousin 16    daughter of an unaffected mat aunt who is 15   the patient's father is in his early 43s. The patient's mother died at the age of 63, from complications of breast cancer which had been diagnosed "years before". The patient had 2 brothers, 2 sisters. The only other breast cancer in the family was a  maternal aunt who died from breast cancer at the age of 10. (This means that 2 of 3 sisters, including the patient's mother, had breast cancer, one of them before the age of 49). There is no history of ovarian cancer in the family.  GYNECOLOGIC HISTORY:  Menarche age 72, first live birth age 35. The patient is GX P1. She underwent total abdominal hysterectomy with bilateral salpingo-oophorectomy at age 89. She took hormone replacement for one or 2  months old.  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 The patient is a widow. She lives by herself, with no pets. Her daughter Kenleigh Toback lives in [place], where she works in a Geneticist, molecular. The patient has no grandchildren. She is a Psychologist, forensic    ADVANCED DIRECTIVES: Not in place; advanced directives were discussed with the patient at her initial visit 01/18/2014. She intends to name her daughter as healthcare power of attorney. [name] can be reached at [phone]   HEALTH MAINTENANCE: History  Substance Use Topics  . Smoking status: Former Smoker    Types: Cigarettes  . Smokeless tobacco: Not on file  . Alcohol Use: Yes     Colonoscopy:  PAP: Status post hysterectomy  Bone density: Remote  Lipid panel:  No Known Allergies  Current Outpatient Prescriptions  Medication Sig Dispense Refill  . amLODipine (NORVASC) 5 MG tablet       . Atorvastatin Calcium (LIPITOR PO) Take by mouth.        . glyBURIDE (DIABETA) 5 MG tablet Take 5 mg by mouth 2 (two) times daily.        Marland Kitchen lisinopril-hydrochlorothiazide (PRINZIDE,ZESTORETIC) 20-12.5 MG per tablet       . metFORMIN (GLUCOPHAGE-XR) 500 MG 24 hr tablet        No current facility-administered medications for this visit.    OBJECTIVE: Middle-aged [place] American woman in no acute distress Filed Vitals:   01/18/14 1520  BP: 215/107  Pulse: 108  Temp: 98.4 F (36.9 C)  Resp: 20     Body mass index is 40.34 kg/(m^2).    ECOG FS:0 - Asymptomatic  Ocular: Sclerae unicteric, pupils equal, round and reactive to light Ear-nose-throat: Oropharynx clear and moist Lymphatic: No cervical or supraclavicular adenopathy Lungs no rales or rhonchi, good excursion bilaterally Heart regular rate and rhythm, no murmur  appreciated Abd soft, obese, nontender, positive bowel sounds MSK no focal spinal tenderness, no joint edema Neuro: non-focal, well-oriented, positive affect Breasts: I do not palpate a mass in the right breast there there are no skin or nipple changes of concern. The right axilla is benign. The left breast is unremarkable.   LAB RESULTS:  CMP  No results found for this basename: na, k, cl, co2, glucose, bun, creatinine, calcium, prot, albumin, ast, alt, alkphos, bilitot, gfrnonaa, gfraa    I No results found for this basename: SPEP, UPEP,  kappa and lambda light chains    Lab Results  Component Value Date   WBC 10.8* 01/18/2014   NEUTROABS 7.9* 01/18/2014   HGB 11.0 Repeated and Verified* 01/18/2014   HCT 35.0 01/18/2014   MCV 82.7 01/18/2014   PLT 261 01/18/2014      Chemistry   No results found for this basename: NA, K, CL, CO2, BUN, CREATININE, GLU  No results found for this basename: CALCIUM, ALKPHOS, AST, ALT, BILITOT       No results found for this basename: LABCA2    No components found with this basename: LABCA125    No results found for this basename: INR,  in the last 168 hours  Urinalysis No results found for this basename: colorurine, appearanceur, labspec, phurine, glucoseu, hgbur, bilirubinur, ketonesur, proteinur, urobilinogen, nitrite, leukocytesur    STUDIES: Mr Breast Bilateral W Wo Contrast  01/12/2014   CLINICAL DATA:  Patient is a 58 year old female with newly diagnosed right breast cancer on ultrasound-guided core needle biopsy performed 12/29/2013. She has a family history of breast cancer diagnosed her mother at age 90.  LABS:  BUN 16, creatinine 1.1, GFR greater than 60.  EXAM: BILATERAL BREAST MRI WITH AND WITHOUT CONTRAST  TECHNIQUE: Multiplanar, multisequence MR images of both breasts were obtained prior to and following the intravenous administration of 36ml of MultiHance.  THREE-DIMENSIONAL MR IMAGE RENDERING ON INDEPENDENT WORKSTATION:   Three-dimensional MR images were rendered by post-processing of the original MR data on an independent workstation. The three-dimensional MR images were interpreted, and findings are reported in the following complete MRI report for this study. Three dimensional images were evaluated at the independent DynaCad workstation  COMPARISON:  08/08/2010, 08/27/2011, 10/01/2012, 12/08/2013, and 12/20/2013 mammograms. Right breast ultrasound dated 12/20/2013.  FINDINGS: Breast composition: c:  Heterogeneous fibroglandular tissue  Background parenchymal enhancement: Moderate  Right breast: A heterogeneously enhancing round mass with irregular margins and internal focus of metallic clip artifact is identified in the lower slightly outer right breast at posterior depth consistent with biopsy-proven malignancy. Mild adjacent post biopsy sequelae as identified. This measures approximately 2.2 x 1.9 x 2 cm in maximal diameter (AP by transverse by CC dimension) and demonstrates rapid contrast uptake with washout perfusion kinetics.  A nonenhancing 2 cm diameter oval well-circumscribed mass is identified in the medial right breast at anterior depth. This demonstrates benign MR features and has remained mammographically stable dating back to 2011. A 1 cm diameter well-circumscribed oval mass in the upper midline of the right breast at mid depth demonstrates a likely fatty hilum when imaged in the sagittal projection and demonstrates perfusion characteristics consistent most consistent with intramammary lymph node. A few additional scattered intramammary lymph nodes with benign features are identified.  Left breast: Numerous foci of non mass enhancement with benign MR features. Mild motion creates misregistration and slightly degrades the subtraction sequence of the left breast. A nonenhancing 1.5 cm diameter oval well-circumscribed mass in the medial breast at mid depth demonstrates benign MR features and is consistent  mammographically stable involuting fibroadenoma. In addition, an oval well-circumscribed nonenhancing 1 cm diameter mass in the subareolar breast demonstrates benign MR features and has remained mammographically stable dating back to 2011. Also, a homogeneously enhancing oval well-circumscribed mass measuring 9 mm in diameter is identified in the subareolar breast at anterior to mid depth; this also demonstrates benign MR features and has been mammographically stable dating back to 2011.  Lymph nodes: The left axillary and internal mammary lymph node chains are unremarkable. A right axillary lymph node is identified with focal cortical thickening upwards of 9 mm and possible effacement of the fatty hilum.  Ancillary findings:  None.  IMPRESSION: 1. Biopsy-proven malignancy in the lower slightly outer right breast measures approximately 2.2 x 1.9 x 2 cm. 2. No MR findings of multicentric or contralateral disease. 3. Cortical thickening of a right axillary lymph node. 4. Numerous scattered breast  masses with benign MR features, most of which demonstrate mammographic stability dating back to 2011.  RECOMMENDATION: 1. Continued surgical management of biopsy-proven malignancy in the right breast. 2. Cortical thickening of a right axillary lymph node. This may represent reactive change following image guided biopsy versus nodal disease. If indicated, second-look right axillary ultrasound and possible core needle biopsy may be considered.  BI-RADS CATEGORY  4: Suspicious.   Electronically Signed   By: Andres Shad   On: 01/12/2014 11:55   Mm Digital Diagnostic Unilat R  12/29/2013   CLINICAL DATA:  Patient with suspicious right breast mass.  EXAM: DIAGNOSTIC RIGHT MAMMOGRAM POST ULTRASOUND BIOPSY  COMPARISON:  Previous exams  FINDINGS: Mammographic images were obtained following ultrasound guided biopsy of suspicious right breast mass. Ribbon shaped biopsy marking clip in appropriate position.  IMPRESSION: Appropriate  position biopsy marking clip status post ultrasound-guided core needle biopsy.  Final Assessment: Post Procedure Mammograms for Marker Placement   Electronically Signed   By: Lovey Newcomer M.D.   On: 12/29/2013 10:34   Mm Digital Diagnostic Unilat R  12/20/2013   CLINICAL DATA:  Screening callback for questioned right breast mass  EXAM: DIGITAL DIAGNOSTIC  right MAMMOGRAM  ULTRASOUND right BREAST  COMPARISON:  Prior exams  ACR Breast Density Category d: The breast tissue is extremely dense, which lowers the sensitivity of mammography.  FINDINGS: Additional views confirm the presence of a mildly irregular oval 1.3 cm right breast mass 6 o'clock location. This corresponds to the questioned finding at screening mammography.  On physical exam, I palpate no abnormality in the left breast 6 o'clock location.  Ultrasound is performed, showing an irregular shadowing hypoechoic mass in the right breast 6 o'clock location 8 cm from the nipple measuring 1.4 x 1.3 x 1.2 cm. No right axillary lymphadenopathy is identified. This corresponds to the mammographic finding.  IMPRESSION: Suspicious right breast mass, 6 o'clock location. Ultrasound-guided biopsy will be scheduled at the patient's convenience and dictated separately.  RECOMMENDATION: Right ultrasound-guided core biopsy  I have discussed the findings and recommendations with the patient. Results were also provided in writing at the conclusion of the visit. If applicable, a reminder letter will be sent to the patient regarding the next appointment.  BI-RADS CATEGORY  4: Suspicious abnormality - biopsy should be considered.   Electronically Signed   By: Conchita Paris M.D.   On: 12/20/2013 11:33   Mm Radiologist Eval And Mgmt  12/30/2013   EXAM: ESTABLISHED PATIENT OFFICE VISIT -LEVEL II (10932)  CHIEF COMPLAINT: Patient with recent ultrasound-guided core needle biopsy right breast.  HISTORY OF PRESENT ILLNESS: Recent ultrasound-guided core needle biopsy right breast  mass. Pathology results demonstrate invasive ductal carcinoma. This is concordant with imaging findings.  PHYSICAL EXAMINATION: Biopsy site within the 6 o'clock position right breast inferiorly is unremarkable without significant hematoma formation.  ASSESSMENT AND PLAN: Recently diagnosed right breast invasive ductal carcinoma.  Surgical referral with Dr. Donne Hazel for definitive surgical management.   Electronically Signed   By: Lovey Newcomer M.D.   On: 12/30/2013 13:55   US Breast Ltd Uni Right Inc Axilla  12/20/2013   CLINICAL DATA:  Screening callback for questioned right breast mass  EXAM: DIGITAL DIAGNOSTIC  right MAMMOGRAM  ULTRASOUND right BREAST  COMPARISON:  Prior exams  ACR Breast Density Category d: The breast tissue is extremely dense, which lowers the sensitivity of mammography.  FINDINGS: Additional views confirm the presence of a mildly irregular oval 1.3 cm right breast mass 6  o'clock location. This corresponds to the questioned finding at screening mammography.  On physical exam, I palpate no abnormality in the left breast 6 o'clock location.  Ultrasound is performed, showing an irregular shadowing hypoechoic mass in the right breast 6 o'clock location 8 cm from the nipple measuring 1.4 x 1.3 x 1.2 cm. No right axillary lymphadenopathy is identified. This corresponds to the mammographic finding.  IMPRESSION: Suspicious right breast mass, 6 o'clock location. Ultrasound-guided biopsy will be scheduled at the patient's convenience and dictated separately.  RECOMMENDATION: Right ultrasound-guided core biopsy  I have discussed the findings and recommendations with the patient. Results were also provided in writing at the conclusion of the visit. If applicable, a reminder letter will be sent to the patient regarding the next appointment.  BI-RADS CATEGORY  4: Suspicious abnormality - biopsy should be considered.   Electronically Signed   By: Conchita Paris M.D.   On: 12/20/2013 11:33   [place] Rt Breast  Bx W Loc Dev 1st Lesion Img Bx Spec US Guide  01/03/2014   ADDENDUM REPORT: 01/03/2014 08:06  ADDENDUM: Addendum by Dr. Conchita Paris on 01/03/2014 at 8:05 a.m. There is an inadvertent typographical transcription error in the original report. The breast density category should have correctly read as follows:  ACR breast density category B: Scattered fibroglandular densities.  The report is otherwise correct as originally dictated.   Electronically Signed   By: Conchita Paris M.D.   On: 01/03/2014 08:06   01/03/2014   CLINICAL DATA:  Suspicious right breast mass  EXAM: ULTRASOUND GUIDED RIGHT BREAST CORE NEEDLE BIOPSY  COMPARISON:  Previous exams.  PROCEDURE: I met with the patient and we discussed the procedure of ultrasound-guided biopsy, including benefits and alternatives. We discussed the high likelihood of a successful procedure. We discussed the risks of the procedure including infection, bleeding, tissue injury, clip migration, and inadequate sampling. Informed written consent was given. The usual time-out protocol was performed immediately prior to the procedure.  Using sterile technique and 2% Lidocaine as local anesthetic, under direct ultrasound visualization, a 12 gauge vacuum-assisteddevice was used to perform biopsy of suspicious irregular hypoechoic mass within the right breast 6 o'clock position using a medial approach. At the conclusion of the procedure, a ribbon shaped tissue marker clip was deployed into the biopsy cavity. Follow-up 2-view mammogram was performed and dictated separately.  IMPRESSION: Ultrasound-guided biopsy of suspicious right breast mass 6 o'clock position. No apparent complications.  Electronically Signed: By: Lovey Newcomer M.D. On: 12/29/2013 10:35    ASSESSMENT: 58 y.o. [place] woman status post right breast biopsy 12/29/2013 for a clinical T2 N1, stage IIB invasive ductal carcinoma, grade 3, triple negative, with an MIB-1 of 80%.   sample for genetic testing sent  01/12/2014, results pending  Status post of a pituitary adenoma 09/05/2005, no malignancy identified  PLAN: We spent the better part of today's hour-long appointment discussing the biology of breast cancer in general, and the specifics of the patient's tumor in particular. The patient understands there is no survival advantage comparing mastectomy with lumpectomy plus radiation. At the multidisciplinary breast cancer conference it was felt she would be a good candidate for breast conservation and the plan is for breast conservation surgery with sentinel lymph node sampling. Whether or not she will need radiation will depend on the final pathology from that procedure.  With regard to systemic therapy, we discussed the fact that Winefred's breast cancer would not be expected to respond to anti-estrogens or anti-HER-2 treatment. Accordingly  her only option for systemic treatment is chemotherapy and that is what is planned. We will ask Dr. Donne Hazel to place a port at the time of surgery. The patient also will have an echocardiogram prior to her next visit here. She will come to chemotherapy school to learn more about the possible side effects of her chemotherapy, which will consist of cyclophosphamide and doxorubicin in dose dense fashion x4, with Neulasta support, followed by carboplatin and paclitaxel weekly x12.  The patient had been scheduled for biopsy of the small lymph node in the right axilla that showed mild cortical thickening. The patient questioned whether this procedure was necessary. I discussed that with Dr. Donne Hazel, and both of [place] feel that the patient meets Z-11 criteria and that the lymph node is likely to be reactive secondary to her prior biopsy, as it had not been noted on dedicated ultrasonography of the right axilla prior to biopsy. Sentinel lymph node sampling is adequate and presurgical biopsy is not necessary in this case.   Abigail has a good understanding of the overall plan. She  agrees with it. She knows a goal of treatment in her cases cure. She will call with any problems that may develop before her next visit here.   Chauncey Cruel, MD   01/18/2014 3:26 PM

## 2014-01-19 ENCOUNTER — Other Ambulatory Visit: Payer: BC Managed Care – PPO

## 2014-01-19 ENCOUNTER — Other Ambulatory Visit (INDEPENDENT_AMBULATORY_CARE_PROVIDER_SITE_OTHER): Payer: Self-pay | Admitting: General Surgery

## 2014-01-19 DIAGNOSIS — C50511 Malignant neoplasm of lower-outer quadrant of right female breast: Secondary | ICD-10-CM

## 2014-01-23 ENCOUNTER — Other Ambulatory Visit: Payer: BC Managed Care – PPO

## 2014-01-23 ENCOUNTER — Other Ambulatory Visit (HOSPITAL_COMMUNITY): Payer: BC Managed Care – PPO

## 2014-01-24 ENCOUNTER — Encounter: Payer: Self-pay | Admitting: Genetic Counselor

## 2014-01-24 ENCOUNTER — Telehealth (INDEPENDENT_AMBULATORY_CARE_PROVIDER_SITE_OTHER): Payer: Self-pay

## 2014-01-24 NOTE — Progress Notes (Signed)
Referring Physician: Rolm Bookbinder, MD  [name] was called today to discuss the first of her genetic test results. Please see the Genetics note from her visit on 01/12/14.   GENETIC TESTING: At the time of Ms. [name]'s visit, we recommended she pursue genetic testing of multiple genes. Given the use of this information for surgical management, BRCAplus was ordered first. This test includs sequencing and deletion/duplication analysis of BRCA1, BRCA2, CDH1, PTEN, and p53 genes, and was performed at [facility]. Testing was normal and did not reveal a mutation in these genes. Testing for mutations in PALB2 is pending. Once results are obtained, [name] will be called again.    Steele Berg, MS, [place]  Certified Genetic Counseor  phone: [phone]  [email or web address]

## 2014-01-24 NOTE — Telephone Encounter (Signed)
LMOM for pt to call me. I want to talk to her see how she is doing before her sx date 02/06/14. I want to make sure she doesn't need another appt with Dr Donne Hazel before sx date.

## 2014-01-25 NOTE — Telephone Encounter (Signed)
Pt returned my call. I wanted to check on her to make sure she was doing ok and make sure she was fine with the surgery date being in a couple of weeks. The pt said that the surgery date for 02/06/14 is perfect for her b/c she is able to go to the beach this weekend. The pt is doing well and the pt really doesn't have anymore questions or concerns. I will pass the message to Dr Donne Hazel.

## 2014-01-26 ENCOUNTER — Ambulatory Visit (HOSPITAL_COMMUNITY)
Admission: RE | Admit: 2014-01-26 | Discharge: 2014-01-26 | Disposition: A | Discharge status: Home / Self Care | Payer: BC Managed Care – PPO | Source: Ambulatory Visit | Attending: Oncology | Admitting: Oncology

## 2014-01-26 DIAGNOSIS — Z01810 Encounter for preprocedural cardiovascular examination: Secondary | ICD-10-CM | POA: Insufficient documentation

## 2014-01-26 DIAGNOSIS — C50511 Malignant neoplasm of lower-outer quadrant of right female breast: Secondary | ICD-10-CM

## 2014-01-26 DIAGNOSIS — Z803 Family history of malignant neoplasm of breast: Secondary | ICD-10-CM

## 2014-01-26 DIAGNOSIS — E7800 Pure hypercholesterolemia, unspecified: Secondary | ICD-10-CM

## 2014-01-26 DIAGNOSIS — I10 Essential (primary) hypertension: Secondary | ICD-10-CM | POA: Insufficient documentation

## 2014-01-26 DIAGNOSIS — E119 Type 2 diabetes mellitus without complications: Secondary | ICD-10-CM | POA: Insufficient documentation

## 2014-01-26 DIAGNOSIS — E785 Hyperlipidemia, unspecified: Secondary | ICD-10-CM | POA: Insufficient documentation

## 2014-01-26 NOTE — Progress Notes (Signed)
Echocardiogram 2D Echocardiogram has been performed.  [name] [name] 01/26/2014, 9:17 AM

## 2014-01-31 ENCOUNTER — Encounter (HOSPITAL_BASED_OUTPATIENT_CLINIC_OR_DEPARTMENT_OTHER): Payer: Self-pay | Admitting: *Deleted

## 2014-01-31 NOTE — Progress Notes (Signed)
01/31/14 1615  OBSTRUCTIVE SLEEP APNEA  Have you ever been diagnosed with sleep apnea through a sleep study? No  Do you snore loudly (loud enough to be heard through closed doors)?  1  Do you often feel tired, fatigued, or sleepy during the daytime? 0  Has anyone observed you stop breathing during your sleep? 0  Do you have, or are you being treated for high blood pressure? 1  BMI more than 35 kg/m2? 1  Age over 56 years old? 1  Neck circumference greater than 40 cm/16 inches? 1  Gender: 0  Obstructive Sleep Apnea Score 5  Score 4 or greater  Results sent to PCP

## 2014-01-31 NOTE — Progress Notes (Signed)
Pt will come in for ccs labs and ekg-will done cbc cmet-since last abn,

## 2014-02-02 ENCOUNTER — Encounter (INDEPENDENT_AMBULATORY_CARE_PROVIDER_SITE_OTHER): Payer: Self-pay

## 2014-02-02 ENCOUNTER — Ambulatory Visit
Admission: RE | Admit: 2014-02-02 | Discharge: 2014-02-02 | Disposition: A | Discharge status: Home / Self Care | Payer: BC Managed Care – PPO | Source: Ambulatory Visit | Attending: General Surgery | Admitting: General Surgery

## 2014-02-02 DIAGNOSIS — C50511 Malignant neoplasm of lower-outer quadrant of right female breast: Secondary | ICD-10-CM

## 2014-02-03 ENCOUNTER — Encounter (HOSPITAL_BASED_OUTPATIENT_CLINIC_OR_DEPARTMENT_OTHER)
Admission: RE | Admit: 2014-02-03 | Discharge: 2014-02-03 | Disposition: A | Discharge status: Home / Self Care | Payer: BC Managed Care – PPO | Source: Ambulatory Visit | Attending: General Surgery | Admitting: General Surgery

## 2014-02-03 ENCOUNTER — Encounter: Payer: Self-pay | Admitting: Genetic Counselor

## 2014-02-03 LAB — COMPREHENSIVE METABOLIC PANEL
ALBUMIN: 4.1 g/dL (ref 3.5–5.2)
ALK PHOS: 114 U/L (ref 39–117)
ALT: 15 U/L (ref 0–35)
AST: 15 U/L (ref 0–37)
BUN: 20 mg/dL (ref 6–23)
CALCIUM: 10 mg/dL (ref 8.4–10.5)
CO2: 24 mEq/L (ref 19–32)
Chloride: 100 mEq/L (ref 96–112)
Creatinine, Ser: 1.16 mg/dL — ABNORMAL HIGH (ref 0.50–1.10)
GFR calc non Af Amer: 52 mL/min — ABNORMAL LOW (ref >90)
GFR, EST AFRICAN AMERICAN: 60 mL/min — AB (ref >90)
GLUCOSE: 105 mg/dL — AB (ref 70–99)
Potassium: 4.4 mEq/L (ref 3.7–5.3)
SODIUM: 141 meq/L (ref 137–147)
TOTAL PROTEIN: 8.5 g/dL — AB (ref 6.0–8.3)
Total Bilirubin: 0.3 mg/dL (ref 0.3–1.2)

## 2014-02-03 LAB — CBC WITH DIFFERENTIAL/PLATELET
Basophils Absolute: 0 10*3/uL (ref 0.0–0.1)
Basophils Relative: 0 % (ref 0–1)
EOS ABS: 0.2 10*3/uL (ref 0.0–0.7)
EOS PCT: 2 % (ref 0–5)
HCT: 37.8 % (ref 36.0–46.0)
Hemoglobin: 11.6 g/dL — ABNORMAL LOW (ref 12.0–15.0)
Lymphocytes Relative: 24 % (ref 12–46)
Lymphs Abs: 2.3 10*3/uL (ref 0.7–4.0)
MCH: 26 pg (ref 26.0–34.0)
MCHC: 30.7 g/dL (ref 30.0–36.0)
MCV: 84.6 fL (ref 78.0–100.0)
MONOS PCT: 6 % (ref 3–12)
Monocytes Absolute: 0.5 10*3/uL (ref 0.1–1.0)
NEUTROS PCT: 68 % (ref 43–77)
Neutro Abs: 6.5 10*3/uL (ref 1.7–7.7)
PLATELETS: 280 10*3/uL (ref 150–400)
RBC: 4.47 MIL/uL (ref 3.87–5.11)
RDW: 16.4 % — ABNORMAL HIGH (ref 11.5–15.5)
WBC: 9.5 10*3/uL (ref 4.0–10.5)

## 2014-02-03 NOTE — Progress Notes (Signed)
Referring Physician: Rolm Bookbinder, MD  Ms. Coin was called today to discuss genetic test results. Please see the Genetics note from her visit on 01/12/14.  GENETIC TESTING: At the time of Ms. Dublin visit, we recommended she pursue genetic testing of multiple genes. She first had BRCAplus (BRCA1, BRCA2, CDH1, PTEN and TP53), followed by PALB2. Testing, which included sequencing and deletion/duplication analysis of these 6 genes, was performed at [facility]. Testing was normal and did not reveal a mutation in any of these genes.   We discussed with Ms. Mohrmann that since the current tests are not perfect, it is possible there may be a gene mutation that current testing cannot detect, but that chance is small.  We also discussed that it is possible that a different genetic factor, which was not tested or has not yet been discovered, is responsible for the cancer diagnoses in the family. There are other genes that are associated with increased cancer risk that can be analyzed. The laboratories that offer such testing look at these additional genes via a larger hereditary cancer gene panel. Should Ms. Moura wish to discuss this additional testing with [place] or pursue such testing, we are happy to coordinate this at any time, but do not feel that she is at significant risk of harboring a mutation in a different gene.     CANCER SCREENING: This result suggests that Ms. Cavins's cancer was most likely not due to an inherited predisposition. Most cancers happen by chance and this negative test, along with details of her family history, suggests that her cancer falls into this category. We, therefore, recommended she continue to follow the cancer screening guidelines provided by her physician. Of note, she initially reported that a maternal cousin had ovarian cousin. This was now corrected to uterine cancer.  FAMILY MEMBERS: Women in her immediage family are at some increased risk of developing  breast cancer, over the general population risk, simply due to the family history. We recommended they have a yearly mammogram beginning at age 57, a yearly clinical breast exam, and perform monthly breast self-exams. A gynecologic exam is recommended yearly. Colon cancer screening is recommended to begin by age 46.  Lastly, we discussed with Ms. Yeats that cancer genetics is a rapidly advancing field and it is possible that new genetic tests will be appropriate for her in the future. We encouraged her to remain in contact with [place] on an annual basis so we can update her personal and family histories, and let her know of advances in cancer genetics that may benefit the family. Our contact number was provided. Ms. Sandles questions were answered to her satisfaction today, and she knows she is welcome to call anytime with additional questions.    Steele Berg, MS, [place] Certified Genetic Counseor phone: [phone] [email or web address]

## 2014-02-04 LAB — CANCER ANTIGEN 27.29: CA 27.29: 51 U/mL — ABNORMAL HIGH (ref 0–39)

## 2014-02-06 ENCOUNTER — Ambulatory Visit (HOSPITAL_COMMUNITY): Payer: BC Managed Care – PPO

## 2014-02-06 ENCOUNTER — Ambulatory Visit (HOSPITAL_BASED_OUTPATIENT_CLINIC_OR_DEPARTMENT_OTHER)
Admission: RE | Admit: 2014-02-06 | Discharge: 2014-02-06 | Disposition: A | Discharge status: Home / Self Care | Payer: BC Managed Care – PPO | Source: Ambulatory Visit | Attending: General Surgery | Admitting: General Surgery

## 2014-02-06 ENCOUNTER — Encounter (HOSPITAL_BASED_OUTPATIENT_CLINIC_OR_DEPARTMENT_OTHER)
Admission: RE | Disposition: A | Discharge status: Home / Self Care | Payer: Self-pay | Source: Ambulatory Visit | Attending: General Surgery

## 2014-02-06 ENCOUNTER — Telehealth (INDEPENDENT_AMBULATORY_CARE_PROVIDER_SITE_OTHER): Payer: Self-pay | Admitting: General Surgery

## 2014-02-06 DIAGNOSIS — C50919 Malignant neoplasm of unspecified site of unspecified female breast: Secondary | ICD-10-CM | POA: Insufficient documentation

## 2014-02-06 HISTORY — DX: Presence of spectacles and contact lenses: Z97.3

## 2014-02-06 HISTORY — DX: Presence of dental prosthetic device (complete) (partial): Z97.2

## 2014-02-06 HISTORY — DX: Unspecified glaucoma: H40.9

## 2014-02-06 SURGERY — RADIOACTIVE SEED GUIDED PARTIAL MASTECTOMY WITH AXILLARY SENTINEL LYMPH NODE BIOPSY AND AXILLARY LYMPH NODE DISSECTION
Anesthesia: General | Site: Breast | Laterality: Right

## 2014-02-06 MED ORDER — FENTANYL CITRATE 0.05 MG/ML IJ SOLN
INTRAMUSCULAR | Status: AC
  Filled 2014-02-06: qty 6

## 2014-02-06 MED ORDER — BUPIVACAINE-EPINEPHRINE (PF) 0.25% -1:200000 IJ SOLN
INTRAMUSCULAR | Status: AC
  Filled 2014-02-06: qty 30

## 2014-02-06 MED ORDER — METHYLENE BLUE 1 % INJ SOLN
INTRAMUSCULAR | Status: AC
  Filled 2014-02-06: qty 10

## 2014-02-06 MED ORDER — SODIUM CHLORIDE 0.9 % IJ SOLN
INTRAMUSCULAR | Status: AC
  Filled 2014-02-06: qty 10

## 2014-02-06 MED ORDER — MIDAZOLAM HCL 2 MG/2ML IJ SOLN
INTRAMUSCULAR | Status: AC
  Filled 2014-02-06: qty 2

## 2014-02-06 MED ORDER — CEFAZOLIN SODIUM-DEXTROSE 2-3 GM-% IV SOLR
INTRAVENOUS | Status: AC
  Filled 2014-02-06: qty 50

## 2014-02-06 SURGICAL SUPPLY — 58 items
APPLIER CLIP 9.375 MED OPEN (MISCELLANEOUS)
BENZOIN TINCTURE PRP APPL 2/3 (GAUZE/BANDAGES/DRESSINGS) IMPLANT
BINDER BREAST LRG (GAUZE/BANDAGES/DRESSINGS) IMPLANT
BINDER BREAST MEDIUM (GAUZE/BANDAGES/DRESSINGS) IMPLANT
BINDER BREAST XLRG (GAUZE/BANDAGES/DRESSINGS) IMPLANT
BINDER BREAST XXLRG (GAUZE/BANDAGES/DRESSINGS) IMPLANT
BLADE 15 SAFETY STRL DISP (BLADE) IMPLANT
CANISTER SUC SOCK COL 7IN (MISCELLANEOUS) IMPLANT
CANISTER SUCT 1200ML W/VALVE (MISCELLANEOUS) IMPLANT
CHLORAPREP W/TINT 26ML (MISCELLANEOUS) IMPLANT
CLIP APPLIE 9.375 MED OPEN (MISCELLANEOUS) IMPLANT
CLOSURE WOUND 1/2 X4 (GAUZE/BANDAGES/DRESSINGS)
COVER MAYO STAND STRL (DRAPES) IMPLANT
COVER PROBE W GEL 5X96 (DRAPES) IMPLANT
COVER TABLE BACK 60X90 (DRAPES) IMPLANT
DECANTER SPIKE VIAL GLASS SM (MISCELLANEOUS) IMPLANT
DERMABOND ADVANCED (GAUZE/BANDAGES/DRESSINGS)
DERMABOND ADVANCED .7 DNX12 (GAUZE/BANDAGES/DRESSINGS) IMPLANT
DEVICE DUBIN W/COMP PLATE 8390 (MISCELLANEOUS) IMPLANT
DRAPE LAPAROSCOPIC ABDOMINAL (DRAPES) IMPLANT
DRAPE PED LAPAROTOMY (DRAPES) IMPLANT
DRSG TEGADERM 4X4.75 (GAUZE/BANDAGES/DRESSINGS) IMPLANT
ELECT COATED BLADE 2.86 ST (ELECTRODE) IMPLANT
ELECT REM PT RETURN 9FT ADLT (ELECTROSURGICAL)
ELECTRODE REM PT RTRN 9FT ADLT (ELECTROSURGICAL) IMPLANT
GLOVE BIO SURGEON STRL SZ7 (GLOVE) IMPLANT
GLOVE BIOGEL PI IND STRL 7.5 (GLOVE) IMPLANT
GLOVE BIOGEL PI INDICATOR 7.5 (GLOVE)
GOWN STRL REUS W/ TWL LRG LVL3 (GOWN DISPOSABLE) IMPLANT
GOWN STRL REUS W/TWL LRG LVL3 (GOWN DISPOSABLE)
KIT MARKER MARGIN INK (KITS) IMPLANT
NEEDLE HYPO 25X1 1.5 SAFETY (NEEDLE) IMPLANT
NS IRRIG 1000ML POUR BTL (IV SOLUTION) IMPLANT
PACK BASIN DAY SURGERY FS (CUSTOM PROCEDURE TRAY) IMPLANT
PENCIL BUTTON HOLSTER BLD 10FT (ELECTRODE) IMPLANT
SHEET MEDIUM DRAPE 40X70 STRL (DRAPES) IMPLANT
SLEEVE SCD COMPRESS KNEE MED (MISCELLANEOUS) IMPLANT
SPONGE GAUZE 4X4 12PLY STER LF (GAUZE/BANDAGES/DRESSINGS) IMPLANT
SPONGE LAP 4X18 X RAY DECT (DISPOSABLE) IMPLANT
STAPLER VISISTAT 35W (STAPLE) IMPLANT
STOCKINETTE IMPERVIOUS LG (DRAPES) IMPLANT
STRIP CLOSURE SKIN 1/2X4 (GAUZE/BANDAGES/DRESSINGS) IMPLANT
SUT ETHILON 2 0 FS 18 (SUTURE) IMPLANT
SUT MNCRL AB 4-0 PS2 18 (SUTURE) IMPLANT
SUT MON AB 4-0 PC3 18 (SUTURE) IMPLANT
SUT MON AB 5-0 PS2 18 (SUTURE) IMPLANT
SUT SILK 2 0 SH (SUTURE) IMPLANT
SUT VIC AB 2-0 SH 27 (SUTURE)
SUT VIC AB 2-0 SH 27XBRD (SUTURE) IMPLANT
SUT VIC AB 3-0 SH 27 (SUTURE)
SUT VIC AB 3-0 SH 27X BRD (SUTURE) IMPLANT
SUT VIC AB 5-0 PS2 18 (SUTURE) IMPLANT
SYR CONTROL 10ML LL (SYRINGE) IMPLANT
TOWEL OR 17X24 6PK STRL BLUE (TOWEL DISPOSABLE) IMPLANT
TOWEL OR NON WOVEN STRL DISP B (DISPOSABLE) IMPLANT
TUBE CONNECTING 20'X1/4 (TUBING)
TUBE CONNECTING 20X1/4 (TUBING) IMPLANT
YANKAUER SUCT BULB TIP NO VENT (SUCTIONS) IMPLANT

## 2014-02-06 NOTE — Progress Notes (Signed)
Pt's BP continues to be elevated despite many rechecks. Dr. Glennon Mac and Dr. Donne Hazel aware and Dr. Donne Hazel has contacted pt's primary physician and is waiting to hear back before pt discharge. Pt aware of situation. Family present.

## 2014-02-06 NOTE — Telephone Encounter (Signed)
Patient bp very elevated today, checked numerous times and still diastolics over 329. In discussion with Dr Glennon Mac of anesthesia we have decided to cancel case.  She will see her pcp Dr Melford Aase today. She has a radioactive seed placed 5/28.  I have notified radiation safety that seed will remain in place for now until safe to operate on patient.

## 2014-02-07 NOTE — Progress Notes (Signed)
Surgery r/s due to hypertension-pcp doubled lisinopril and added ativan-pt to take all htn meds and ativan in am

## 2014-02-08 ENCOUNTER — Encounter (HOSPITAL_BASED_OUTPATIENT_CLINIC_OR_DEPARTMENT_OTHER)
Admission: RE | Disposition: A | Discharge status: Home / Self Care | Payer: Self-pay | Source: Ambulatory Visit | Attending: General Surgery

## 2014-02-08 ENCOUNTER — Encounter (HOSPITAL_COMMUNITY)
Admission: RE | Admit: 2014-02-08 | Discharge: 2014-02-08 | Disposition: A | Discharge status: Home / Self Care | Payer: BC Managed Care – PPO | Source: Ambulatory Visit | Attending: General Surgery | Admitting: General Surgery

## 2014-02-08 ENCOUNTER — Encounter (HOSPITAL_BASED_OUTPATIENT_CLINIC_OR_DEPARTMENT_OTHER): Payer: BC Managed Care – PPO | Admitting: Anesthesiology

## 2014-02-08 ENCOUNTER — Ambulatory Visit (HOSPITAL_BASED_OUTPATIENT_CLINIC_OR_DEPARTMENT_OTHER): Payer: BC Managed Care – PPO | Admitting: Anesthesiology

## 2014-02-08 ENCOUNTER — Ambulatory Visit (HOSPITAL_COMMUNITY): Payer: BC Managed Care – PPO

## 2014-02-08 ENCOUNTER — Encounter (HOSPITAL_BASED_OUTPATIENT_CLINIC_OR_DEPARTMENT_OTHER): Payer: Self-pay | Admitting: Anesthesiology

## 2014-02-08 ENCOUNTER — Ambulatory Visit (HOSPITAL_BASED_OUTPATIENT_CLINIC_OR_DEPARTMENT_OTHER)
Admission: RE | Admit: 2014-02-08 | Discharge: 2014-02-08 | Disposition: A | Discharge status: Home / Self Care | Payer: BC Managed Care – PPO | Source: Ambulatory Visit | Attending: General Surgery | Admitting: General Surgery

## 2014-02-08 ENCOUNTER — Ambulatory Visit
Admit: 2014-02-08 | Discharge: 2014-02-08 | Disposition: A | Discharge status: Home / Self Care | Payer: BC Managed Care – PPO | Attending: General Surgery | Admitting: General Surgery

## 2014-02-08 DIAGNOSIS — I10 Essential (primary) hypertension: Secondary | ICD-10-CM | POA: Insufficient documentation

## 2014-02-08 DIAGNOSIS — Z79899 Other long term (current) drug therapy: Secondary | ICD-10-CM | POA: Insufficient documentation

## 2014-02-08 DIAGNOSIS — E119 Type 2 diabetes mellitus without complications: Secondary | ICD-10-CM | POA: Insufficient documentation

## 2014-02-08 DIAGNOSIS — C50919 Malignant neoplasm of unspecified site of unspecified female breast: Secondary | ICD-10-CM | POA: Insufficient documentation

## 2014-02-08 DIAGNOSIS — F411 Generalized anxiety disorder: Secondary | ICD-10-CM | POA: Insufficient documentation

## 2014-02-08 DIAGNOSIS — C50511 Malignant neoplasm of lower-outer quadrant of right female breast: Secondary | ICD-10-CM

## 2014-02-08 DIAGNOSIS — C50519 Malignant neoplasm of lower-outer quadrant of unspecified female breast: Secondary | ICD-10-CM | POA: Insufficient documentation

## 2014-02-08 DIAGNOSIS — Z171 Estrogen receptor negative status [ER-]: Secondary | ICD-10-CM | POA: Insufficient documentation

## 2014-02-08 DIAGNOSIS — Z87891 Personal history of nicotine dependence: Secondary | ICD-10-CM | POA: Insufficient documentation

## 2014-02-08 DIAGNOSIS — E7800 Pure hypercholesterolemia, unspecified: Secondary | ICD-10-CM | POA: Insufficient documentation

## 2014-02-08 DIAGNOSIS — Z6841 Body Mass Index (BMI) 40.0 and over, adult: Secondary | ICD-10-CM | POA: Insufficient documentation

## 2014-02-08 DIAGNOSIS — E669 Obesity, unspecified: Secondary | ICD-10-CM | POA: Insufficient documentation

## 2014-02-08 HISTORY — PX: BREAST LUMPECTOMY: SHX2

## 2014-02-08 HISTORY — PX: BREAST LUMPECTOMY WITH AXILLARY LYMPH NODE BIOPSY: SHX5593

## 2014-02-08 HISTORY — PX: PORTACATH PLACEMENT: SHX2246

## 2014-02-08 LAB — GLUCOSE, CAPILLARY: GLUCOSE-CAPILLARY: 138 mg/dL — AB (ref 70–99)

## 2014-02-08 SURGERY — RADIOACTIVE SEED GUIDED PARTIAL MASTECTOMY WITH AXILLARY SENTINEL LYMPH NODE BIOPSY
Anesthesia: General | Site: Chest | Laterality: Right

## 2014-02-08 MED ORDER — BUPIVACAINE HCL (PF) 0.25 % IJ SOLN
INTRAMUSCULAR | Status: DC | PRN
  Administered 2014-02-08: 17 mL

## 2014-02-08 MED ORDER — MIDAZOLAM HCL 5 MG/5ML IJ SOLN
INTRAMUSCULAR | Status: DC | PRN
  Administered 2014-02-08: 2 mg via INTRAVENOUS

## 2014-02-08 MED ORDER — CEFAZOLIN SODIUM-DEXTROSE 2-3 GM-% IV SOLR
INTRAVENOUS | Status: AC
  Filled 2014-02-08: qty 50

## 2014-02-08 MED ORDER — BUPIVACAINE-EPINEPHRINE (PF) 0.25% -1:200000 IJ SOLN
INTRAMUSCULAR | Status: AC
  Filled 2014-02-08: qty 30

## 2014-02-08 MED ORDER — HEPARIN (PORCINE) IN NACL 2-0.9 UNIT/ML-% IJ SOLN
INTRAMUSCULAR | Status: AC
  Filled 2014-02-08: qty 500

## 2014-02-08 MED ORDER — BUPIVACAINE HCL (PF) 0.25 % IJ SOLN
INTRAMUSCULAR | Status: DC | PRN
  Administered 2014-02-08: 25 mL

## 2014-02-08 MED ORDER — METHYLENE BLUE 1 % INJ SOLN
INTRAMUSCULAR | Status: AC
  Filled 2014-02-08: qty 10

## 2014-02-08 MED ORDER — CEFAZOLIN SODIUM 1-5 GM-% IV SOLN
INTRAVENOUS | Status: AC
  Filled 2014-02-08: qty 50

## 2014-02-08 MED ORDER — FENTANYL CITRATE 0.05 MG/ML IJ SOLN
50.0000 ug | INTRAMUSCULAR | Status: DC | PRN
  Administered 2014-02-08: 100 ug via INTRAVENOUS
  Administered 2014-02-08: 50 ug via INTRAVENOUS

## 2014-02-08 MED ORDER — OXYCODONE HCL 5 MG PO TABS
5.0000 mg | ORAL_TABLET | Freq: Once | ORAL | Status: AC | PRN
  Administered 2014-02-08: 5 mg via ORAL

## 2014-02-08 MED ORDER — SODIUM CHLORIDE 0.9 % IJ SOLN
INTRAMUSCULAR | Status: AC
  Filled 2014-02-08: qty 10

## 2014-02-08 MED ORDER — MIDAZOLAM HCL 2 MG/2ML IJ SOLN
INTRAMUSCULAR | Status: AC
  Filled 2014-02-08: qty 2

## 2014-02-08 MED ORDER — OXYCODONE-ACETAMINOPHEN 10-325 MG PO TABS: 1.0000 | ORAL_TABLET | Freq: Four times a day (QID) | ORAL | Status: DC | PRN

## 2014-02-08 MED ORDER — HEPARIN SOD (PORK) LOCK FLUSH 100 UNIT/ML IV SOLN
INTRAVENOUS | Status: DC | PRN
  Administered 2014-02-08: 450 [IU] via INTRAVENOUS

## 2014-02-08 MED ORDER — OXYCODONE HCL 5 MG/5ML PO SOLN: 5.0000 mg | Freq: Once | ORAL | Status: AC | PRN

## 2014-02-08 MED ORDER — BUPIVACAINE HCL (PF) 0.25 % IJ SOLN
INTRAMUSCULAR | Status: AC
  Filled 2014-02-08: qty 30

## 2014-02-08 MED ORDER — FENTANYL CITRATE 0.05 MG/ML IJ SOLN
INTRAMUSCULAR | Status: DC | PRN
  Administered 2014-02-08: 75 ug via INTRAVENOUS
  Administered 2014-02-08: 25 ug via INTRAVENOUS
  Administered 2014-02-08: 100 ug via INTRAVENOUS

## 2014-02-08 MED ORDER — TECHNETIUM TC 99M SULFUR COLLOID FILTERED
1.0000 | Freq: Once | INTRAVENOUS | Status: AC | PRN
  Administered 2014-02-08: 1 via INTRADERMAL

## 2014-02-08 MED ORDER — FENTANYL CITRATE 0.05 MG/ML IJ SOLN
INTRAMUSCULAR | Status: AC
  Filled 2014-02-08: qty 2

## 2014-02-08 MED ORDER — HYDROMORPHONE HCL PF 1 MG/ML IJ SOLN
0.2500 mg | INTRAMUSCULAR | Status: DC | PRN
  Administered 2014-02-08: 0.5 mg via INTRAVENOUS

## 2014-02-08 MED ORDER — MIDAZOLAM HCL 2 MG/2ML IJ SOLN
1.0000 mg | INTRAMUSCULAR | Status: DC | PRN
Start: 2014-02-08 — End: 2014-02-08
  Administered 2014-02-08: 2 mg via INTRAVENOUS

## 2014-02-08 MED ORDER — FENTANYL CITRATE 0.05 MG/ML IJ SOLN
INTRAMUSCULAR | Status: AC
  Filled 2014-02-08: qty 6

## 2014-02-08 MED ORDER — OXYCODONE HCL 5 MG PO TABS
ORAL_TABLET | ORAL | Status: AC
  Filled 2014-02-08: qty 1

## 2014-02-08 MED ORDER — ONDANSETRON HCL 4 MG/2ML IJ SOLN
INTRAMUSCULAR | Status: DC | PRN
Start: 2014-02-08 — End: 2014-02-08
  Administered 2014-02-08: 4 mg via INTRAVENOUS

## 2014-02-08 MED ORDER — HEPARIN SOD (PORK) LOCK FLUSH 100 UNIT/ML IV SOLN
INTRAVENOUS | Status: AC
  Filled 2014-02-08: qty 5

## 2014-02-08 MED ORDER — DEXAMETHASONE SODIUM PHOSPHATE 10 MG/ML IJ SOLN
INTRAMUSCULAR | Status: DC | PRN
  Administered 2014-02-08: 4 mg via INTRAVENOUS

## 2014-02-08 MED ORDER — CEFAZOLIN SODIUM-DEXTROSE 2-3 GM-% IV SOLR
2.0000 g | INTRAVENOUS | Status: AC
  Administered 2014-02-08: 2 g via INTRAVENOUS

## 2014-02-08 MED ORDER — HYDROMORPHONE HCL PF 1 MG/ML IJ SOLN
INTRAMUSCULAR | Status: AC
  Filled 2014-02-08: qty 1

## 2014-02-08 MED ORDER — LACTATED RINGERS IV SOLN
INTRAVENOUS | Status: DC
  Administered 2014-02-08: 13:00:00 via INTRAVENOUS

## 2014-02-08 MED ORDER — HEPARIN (PORCINE) IN NACL 2-0.9 UNIT/ML-% IJ SOLN
INTRAMUSCULAR | Status: DC | PRN
  Administered 2014-02-08: 1 via INTRAVENOUS

## 2014-02-08 MED ORDER — LIDOCAINE HCL (CARDIAC) 20 MG/ML IV SOLN
INTRAVENOUS | Status: DC | PRN
  Administered 2014-02-08: 40 mg via INTRAVENOUS

## 2014-02-08 MED ORDER — METOCLOPRAMIDE HCL 5 MG/ML IJ SOLN: 10.0000 mg | Freq: Once | INTRAMUSCULAR | Status: DC | PRN

## 2014-02-08 MED ORDER — PROPOFOL INFUSION 10 MG/ML OPTIME
INTRAVENOUS | Status: DC | PRN
  Administered 2014-02-08: 200 mL via INTRAVENOUS

## 2014-02-08 SURGICAL SUPPLY — 76 items
APPLIER CLIP 9.375 MED OPEN (MISCELLANEOUS) ×4
BAG DECANTER FOR FLEXI CONT (MISCELLANEOUS) ×4 IMPLANT
BENZOIN TINCTURE PRP APPL 2/3 (GAUZE/BANDAGES/DRESSINGS) ×4 IMPLANT
BINDER BREAST 3XL (BIND) ×4 IMPLANT
BINDER BREAST LRG (GAUZE/BANDAGES/DRESSINGS) IMPLANT
BINDER BREAST MEDIUM (GAUZE/BANDAGES/DRESSINGS) IMPLANT
BINDER BREAST XLRG (GAUZE/BANDAGES/DRESSINGS) IMPLANT
BINDER BREAST XXLRG (GAUZE/BANDAGES/DRESSINGS) IMPLANT
BLADE 11 SAFETY STRL DISP (BLADE) IMPLANT
BLADE 15 SAFETY STRL DISP (BLADE) IMPLANT
BLADE SURG 11 STRL SS (BLADE) ×4 IMPLANT
BLADE SURG 15 STRL LF DISP TIS (BLADE) ×2 IMPLANT
BLADE SURG 15 STRL SS (BLADE) ×2
CANISTER SUC SOCK COL 7IN (MISCELLANEOUS) IMPLANT
CANISTER SUCT 1200ML W/VALVE (MISCELLANEOUS) ×4 IMPLANT
CHLORAPREP W/TINT 26ML (MISCELLANEOUS) ×8 IMPLANT
CLIP APPLIE 9.375 MED OPEN (MISCELLANEOUS) ×2 IMPLANT
CLOSURE WOUND 1/2 X4 (GAUZE/BANDAGES/DRESSINGS) ×2
COVER MAYO STAND STRL (DRAPES) ×8 IMPLANT
COVER PROBE W GEL 5X96 (DRAPES) ×4 IMPLANT
COVER TABLE BACK 60X90 (DRAPES) ×4 IMPLANT
DECANTER SPIKE VIAL GLASS SM (MISCELLANEOUS) IMPLANT
DERMABOND ADVANCED (GAUZE/BANDAGES/DRESSINGS) ×4
DERMABOND ADVANCED .7 DNX12 (GAUZE/BANDAGES/DRESSINGS) ×4 IMPLANT
DEVICE DUBIN W/COMP PLATE 8390 (MISCELLANEOUS) ×4 IMPLANT
DRAPE C-ARM 42X72 X-RAY (DRAPES) ×4 IMPLANT
DRAPE LAPAROSCOPIC ABDOMINAL (DRAPES) ×8 IMPLANT
DRSG TEGADERM 4X4.75 (GAUZE/BANDAGES/DRESSINGS) ×4 IMPLANT
ELECT COATED BLADE 2.86 ST (ELECTRODE) ×8 IMPLANT
ELECT REM PT RETURN 9FT ADLT (ELECTROSURGICAL) ×4
ELECTRODE REM PT RTRN 9FT ADLT (ELECTROSURGICAL) ×2 IMPLANT
GLOVE BIO SURGEON STRL SZ7 (GLOVE) ×12 IMPLANT
GLOVE BIOGEL PI IND STRL 7.0 (GLOVE) ×4 IMPLANT
GLOVE BIOGEL PI IND STRL 7.5 (GLOVE) ×4 IMPLANT
GLOVE BIOGEL PI INDICATOR 7.0 (GLOVE) ×4
GLOVE BIOGEL PI INDICATOR 7.5 (GLOVE) ×4
GLOVE ECLIPSE 7.0 STRL STRAW (GLOVE) ×8 IMPLANT
GLOVE SURG SS PI 7.0 STRL IVOR (GLOVE) ×4 IMPLANT
GOWN STRL REUS W/ TWL LRG LVL3 (GOWN DISPOSABLE) ×8 IMPLANT
GOWN STRL REUS W/TWL LRG LVL3 (GOWN DISPOSABLE) ×8
IV KIT MINILOC 20X1 SAFETY (NEEDLE) IMPLANT
KIT MARKER MARGIN INK (KITS) ×4 IMPLANT
KIT PORT POWER 8FR ISP CVUE (Catheter) ×4 IMPLANT
NDL SAFETY ECLIPSE 18X1.5 (NEEDLE) IMPLANT
NEEDLE HYPO 18GX1.5 SHARP (NEEDLE)
NEEDLE HYPO 25X1 1.5 SAFETY (NEEDLE) ×8 IMPLANT
NS IRRIG 1000ML POUR BTL (IV SOLUTION) ×4 IMPLANT
PACK BASIN DAY SURGERY FS (CUSTOM PROCEDURE TRAY) ×4 IMPLANT
PENCIL BUTTON HOLSTER BLD 10FT (ELECTRODE) ×8 IMPLANT
SHEET MEDIUM DRAPE 40X70 STRL (DRAPES) IMPLANT
SLEEVE SCD COMPRESS KNEE MED (MISCELLANEOUS) ×4 IMPLANT
SPONGE GAUZE 4X4 12PLY STER LF (GAUZE/BANDAGES/DRESSINGS) ×4 IMPLANT
SPONGE LAP 4X18 X RAY DECT (DISPOSABLE) ×8 IMPLANT
STAPLER VISISTAT 35W (STAPLE) ×4 IMPLANT
STOCKINETTE IMPERVIOUS LG (DRAPES) IMPLANT
STRIP CLOSURE SKIN 1/2X4 (GAUZE/BANDAGES/DRESSINGS) ×6 IMPLANT
SUT ETHILON 2 0 FS 18 (SUTURE) IMPLANT
SUT MNCRL AB 4-0 PS2 18 (SUTURE) ×8 IMPLANT
SUT MON AB 4-0 PC3 18 (SUTURE) ×4 IMPLANT
SUT MON AB 5-0 PS2 18 (SUTURE) IMPLANT
SUT PROLENE 2 0 SH DA (SUTURE) ×4 IMPLANT
SUT SILK 2 0 SH (SUTURE) IMPLANT
SUT SILK 2 0 TIES 17X18 (SUTURE)
SUT SILK 2-0 18XBRD TIE BLK (SUTURE) IMPLANT
SUT VIC AB 2-0 SH 27 (SUTURE) ×4
SUT VIC AB 2-0 SH 27XBRD (SUTURE) ×4 IMPLANT
SUT VIC AB 3-0 SH 27 (SUTURE) ×4
SUT VIC AB 3-0 SH 27X BRD (SUTURE) ×4 IMPLANT
SUT VIC AB 5-0 PS2 18 (SUTURE) IMPLANT
SYR 5ML LUER SLIP (SYRINGE) ×4 IMPLANT
SYR CONTROL 10ML LL (SYRINGE) ×8 IMPLANT
TOWEL OR 17X24 6PK STRL BLUE (TOWEL DISPOSABLE) ×12 IMPLANT
TOWEL OR NON WOVEN STRL DISP B (DISPOSABLE) ×4 IMPLANT
TUBE CONNECTING 20'X1/4 (TUBING) ×1
TUBE CONNECTING 20X1/4 (TUBING) ×3 IMPLANT
YANKAUER SUCT BULB TIP NO VENT (SUCTIONS) ×4 IMPLANT

## 2014-02-08 NOTE — Discharge Instructions (Signed)
[place] Office Phone Number [phone]  BREAST BIOPSY/LUMPECTOMY: POST OP INSTRUCTIONS  Always review your discharge instruction sheet given to you by the facility where your surgery was performed.  IF YOU HAVE DISABILITY OR FAMILY LEAVE FORMS, YOU MUST BRING THEM TO THE OFFICE FOR PROCESSING.  DO NOT GIVE THEM TO YOUR DOCTOR.  1. A prescription for pain medication may be given to you upon discharge.  Take your pain medication as prescribed, if needed.  If narcotic pain medicine is not needed, then you may take acetaminophen (Tylenol), naprosyn (Alleve) or ibuprofen (Advil) as needed. 2. Take your usually prescribed medications unless otherwise directed 3. If you need a refill on your pain medication, please contact your pharmacy.  They will contact our office to request authorization.  Prescriptions will not be filled after 5pm or on week-ends. 4. You should eat very light the first 24 hours after surgery, such as soup, crackers, pudding, etc.  Resume your normal diet the day after surgery. 5. Most patients will experience some swelling and bruising in the breast.  Ice packs and a good support bra will help.  Wear the breast binder provided or a sports bra for 72 hours day and night.  After that wear a sports bra during the day until you return to the office. Swelling and bruising can take several days to resolve.  6. It is common to experience some constipation if taking pain medication after surgery.  Increasing fluid intake and taking a stool softener will usually help or prevent this problem from occurring.  A mild laxative (Milk of Magnesia or Miralax) should be taken according to package directions if there are no bowel movements after 48 hours. 7. Unless discharge instructions indicate otherwise, you may remove your bandages 48 hours after surgery and you may shower at that time.  You may have steri-strips (small skin tapes) in place directly over the incision.  These  strips should be left on the skin for 7-10 days and will come off on their own.  If your surgeon used skin glue on the incision, you may shower in 24 hours.  The glue will flake off over the next 2-3 weeks.  Any sutures or staples will be removed at the office during your follow-up visit. 8. ACTIVITIES:  You may resume regular daily activities (gradually increasing) beginning the next day.  Wearing a good support bra or sports bra minimizes pain and swelling.  You may have sexual intercourse when it is comfortable. a. You may drive when you no longer are taking prescription pain medication, you can comfortably wear a seatbelt, and you can safely maneuver your car and apply brakes. b. RETURN TO WORK:  ______________________________________________________________________________________ 9. You should see your doctor in the office for a follow-up appointment approximately two weeks after your surgery.  Your doctors nurse will typically make your follow-up appointment when she calls you with your pathology report.  Expect your pathology report 3-4 business days after your surgery.  You may call to check if you do not hear from [place] after three days. 10. OTHER INSTRUCTIONS: _______________________________________________________________________________________________ _____________________________________________________________________________________________________________________________________ _____________________________________________________________________________________________________________________________________ _____________________________________________________________________________________________________________________________________  WHEN TO CALL DR WAKEFIELD: 1. Fever over 101.0 2. Nausea and/or vomiting. 3. Extreme swelling or bruising. 4. Continued bleeding from incision. 5. Increased pain, redness, or drainage from the incision.  The clinic staff is available to answer  your questions during regular business hours.  Please dont hesitate to call and ask to speak to one of the nurses for clinical concerns.  If you have a medical emergency, go to the nearest emergency room or call 911.  A surgeon from [facility], [facility], [facility] is always on call at the hospital.  For further questions, please visit [email or web address]      PORT-A-CATH: POST OP INSTRUCTIONS  Always review your discharge instruction sheet given to you by the facility where your surgery was performed.   1. A prescription for pain medication may be given to you upon discharge. Take your pain medication as prescribed, if needed. If narcotic pain medicine is not needed, then you make take acetaminophen (Tylenol) or ibuprofen (Advil) as needed.  2. Take your usually prescribed medications unless otherwise directed. 3. If you need a refill on your pain medication, please contact our office. All narcotic pain medicine now requires a paper prescription.  Phoned in and fax refills are no longer allowed by law.  Prescriptions will not be filled after 5 pm or on weekends.  4. You should follow a light diet for the remainder of the day after your procedure. 5. Most patients will experience some mild swelling and/or bruising in the area of the incision. It may take several days to resolve. 6. It is common to experience some constipation if taking pain medication after surgery. Increasing fluid intake and taking a stool softener (such as Colace) will usually help or prevent this problem from occurring. A mild laxative (Milk of Magnesia or Miralax) should be taken according to package directions if there are no bowel movements after 48 hours.  7. Unless discharge instructions indicate otherwise, you may remove your bandages 48 hours after surgery, and you may shower at that time. You may have steri-strips (small white skin tapes) in place directly over the incision.  These strips should be left on the skin  for 7-10 days.  If your surgeon used Dermabond (skin glue) on the incision, you may shower in 24 hours.  The glue will flake off over the next 2-3 weeks.  8. If your port is left accessed at the end of surgery (needle left in port), the dressing cannot get wet and should only by changed by a healthcare professional. When the port is no longer accessed (when the needle has been removed), follow step 7.   9. ACTIVITIES:  Limit activity involving your arms for the next 72 hours. Do no strenuous exercise or activity for 1 week. You may drive when you are no longer taking prescription pain medication, you can comfortably wear a seatbelt, and you can maneuver your car. 10.You may need to see your doctor in the office for a follow-up appointment.  Please       check with your doctor.  11.When you receive a new Port-a-Cath, you will get a product guide and        ID card.  Please keep them in case you need them.  WHEN TO CALL YOUR DOCTOR [phone]): 1. Fever over 101.0 2. Chills 3. Continued bleeding from incision 4. Increased redness and tenderness at the site 5. Shortness of breath, difficulty breathing   The clinic staff is available to answer your questions during regular business hours. Please dont hesitate to call and ask to speak to one of the nurses or medical assistants for clinical concerns. If you have a medical emergency, go to the nearest emergency room or call 911.  A surgeon from [facility] is always on call at the hospital.     For further information, please visit [email or web address]   Regional  Anesthesia Blocks  1. Numbness or the inability to move the "blocked" extremity may last from 3-48 hours after placement. The length of time depends on the medication injected and your individual response to the medication. If the numbness is not going away after 48 hours, call your surgeon.  2. The extremity that is blocked will need to be protected until the  numbness is gone and the  Strength has returned. Because you cannot feel it, you will need to take extra care to avoid injury. Because it may be weak, you may have difficulty moving it or using it. You may not know what position it is in without looking at it while the block is in effect.  3. For blocks in the legs and feet, returning to weight bearing and walking needs to be done carefully. You will need to wait until the numbness is entirely gone and the strength has returned. You should be able to move your leg and foot normally before you try and bear weight or walk. You will need someone to be with you when you first try to ensure you do not fall and possibly risk injury.  4. Bruising and tenderness at the needle site are common side effects and will resolve in a few days.  5. Persistent numbness or new problems with movement should be communicated to the surgeon or the [place] [phone] [place] [phone]).     Post Anesthesia Home Care Instructions  Activity: Get plenty of rest for the remainder of the day. A responsible adult should stay with you for 24 hours following the procedure.  For the next 24 hours, DO NOT: -Drive a car -Paediatric nurse -Drink alcoholic beverages -Take any medication unless instructed by your physician -Make any legal decisions or sign important papers.  Meals: Start with liquid foods such as gelatin or soup. Progress to regular foods as tolerated. Avoid greasy, spicy, heavy foods. If nausea and/or vomiting occur, drink only clear liquids until the nausea and/or vomiting subsides. Call your physician if vomiting continues.  Special Instructions/Symptoms: Your throat may feel dry or sore from the anesthesia or the breathing tube placed in your throat during surgery. If this causes discomfort, gargle with warm salt water. The discomfort should disappear within 24 hours.

## 2014-02-08 NOTE — Anesthesia Postprocedure Evaluation (Signed)
Anesthesia Post Note  Patient: [name]  Procedure(s) Performed: Procedure(s) (LRB): RIGHT RADIOACTIVE SEED GUIDED PARTIAL LUMPECTOMY WITH  RIGHT AXILLARY SENTINEL LYMPH NODE BIOPSY (Right) INSERTION PORT-A-CATH (Left)  Anesthesia type: General  Patient location: PACU  Post pain: Pain level controlled  Post assessment: Patient's Cardiovascular Status Stable  Last Vitals:  Filed Vitals:   02/08/14 1615  BP: 180/94  Pulse: 76  Temp:   Resp: 15    Post vital signs: Reviewed and stable  Level of consciousness: alert  Complications: No apparent anesthesia complications

## 2014-02-08 NOTE — Progress Notes (Signed)
Assisted Dr. Frederick with right, ultrasound guided, pectoralis block. Side rails up, monitors on throughout procedure. See vital signs in flow sheet. Tolerated Procedure well. 

## 2014-02-08 NOTE — H&P (View-Only) (Signed)
Patient ID: [name], female   DOB: [birth year], 58 y.o.   MRN: [id number]  Chief Complaint  Patient presents with  . eval rigth breast    HPI [name] is a 58 y.o. female.  Referred by Dr Lovey Newcomer HPI 50 yof who is has no prior breast history and underwent routine screening mammogram with right breast abnormality. She has also undergone ultrasound now and there is a 1.4 x 1.3 x 1.2 cm mass with no right axillary adenopathy.  She has undergone biopsy that shows grade III invasive ductal carcinoma, triple negative.  She reports no breast complaints.  She has family history in her mother at age 39 and an aunt in her 58s.    Past Medical History  Diagnosis Date  . HTN (hypertension)   . Diabetes mellitus   . High cholesterol     Past Surgical History  Procedure Laterality Date  . Abdominal hysterectomy    . Breast biopsy      LEFT  . Transphenoidal pituitary resection      Family History  Problem Relation Age of Onset  . Hypertension Mother   . Diabetes type II Mother   . Hypertension Father     Social History History  Substance Use Topics  . Smoking status: Former Smoker    Types: Cigarettes  . Smokeless tobacco: Not on file  . Alcohol Use: Yes    No Known Allergies  Current Outpatient Prescriptions  Medication Sig Dispense Refill  . amLODipine (NORVASC) 5 MG tablet       . Atorvastatin Calcium (LIPITOR PO) Take by mouth.        . glyBURIDE (DIABETA) 5 MG tablet Take 5 mg by mouth 2 (two) times daily.        Marland Kitchen lisinopril-hydrochlorothiazide (PRINZIDE,ZESTORETIC) 20-12.5 MG per tablet       . metFORMIN (GLUCOPHAGE-XR) 500 MG 24 hr tablet        No current facility-administered medications for this visit.    Review of Systems Review of Systems  Constitutional: Negative for fever, chills and unexpected weight change.  HENT: Negative for congestion, hearing loss, sore throat, trouble swallowing and voice change.   Eyes: Negative for visual  disturbance.  Respiratory: Negative for cough and wheezing.   Cardiovascular: Negative for chest pain, palpitations and leg swelling.  Gastrointestinal: Negative for nausea, vomiting, abdominal pain, diarrhea, constipation, blood in stool, abdominal distention and anal bleeding.  Genitourinary: Negative for hematuria, vaginal bleeding and difficulty urinating.  Musculoskeletal: Negative for arthralgias.  Skin: Negative for rash and wound.  Neurological: Negative for seizures, syncope and headaches.  Hematological: Negative for adenopathy. Does not bruise/bleed easily.  Psychiatric/Behavioral: Negative for confusion.    Blood pressure 138/82, pulse 80, temperature 98.3 F (36.8 C), resp. rate 16, height 5\' 5"  (1.651 m), weight 241 lb (109.317 kg).  Physical Exam Physical Exam  Vitals reviewed. Constitutional: She appears well-developed and well-nourished.  HENT:  Head: Normocephalic and atraumatic.  Eyes: No scleral icterus.  Cardiovascular: Normal rate, regular rhythm and normal heart sounds.   Pulmonary/Chest: Effort normal and breath sounds normal. She has no wheezes. She has no rales. Right breast exhibits no inverted nipple, no mass, no nipple discharge, no skin change and no tenderness. Left breast exhibits no inverted nipple, no mass, no nipple discharge, no skin change and no tenderness.    Lymphadenopathy:    She has no cervical adenopathy.    She has no axillary adenopathy.  Right: No supraclavicular adenopathy present.       Left: No supraclavicular adenopathy present.    Data Reviewed EXAM:  DIGITAL DIAGNOSTIC right MAMMOGRAM  ULTRASOUND right BREAST  COMPARISON: Prior exams  ACR Breast Density Category d: The breast tissue is extremely dense,  which lowers the sensitivity of mammography.  FINDINGS:  Additional views confirm the presence of a mildly irregular oval 1.3  cm right breast mass 6 o'clock location. This corresponds to the  questioned finding at  screening mammography.  On physical exam, I palpate no abnormality in the left breast 6  o'clock location.  Ultrasound is performed, showing an irregular shadowing hypoechoic  mass in the right breast 6 o'clock location 8 cm from the nipple  measuring 1.4 x 1.3 x 1.2 cm. No right axillary lymphadenopathy is  identified. This corresponds to the mammographic finding.  IMPRESSION:  Suspicious right breast mass, 6 o'clock location. Ultrasound-guided  biopsy will be scheduled at the patient's convenience and dictated  separately.   Assessment    Clinical stage I TNBC     Plan    Genetic testing, MRI ( I think reasonable given tnbc and possible primary systemic therapy), med onc appt  We discussed the staging and pathophysiology of breast cancer. We discussed all of the different options for treatment for breast cancer including surgery, chemotherapy, radiation therapy, Herceptin, and antiestrogen therapy. We discussed her pathology at length.  We discussed possibility of chemo first.  We discussed pros and cons of breast MR and have decided to proceed. Will also get her to see med onc up front and discuss at conference  We discussed a sentinel lymph node biopsy as she does not appear to having lymph node involvement right now. We discussed the performance of that with injection of radioactive tracer and blue dye. We discussed that she would have an incision underneath her axillary hairline if she undergoes lumpectomy. We discussed that there is a chance of having a positive node with a sentinel lymph node biopsy and we will await the permanent pathology to make any other first further decisions in terms of her treatment. One of these options might be to return to the operating room to perform an axillary lymph node dissection. We discussed up to a 5% risk lifetime of chronic shoulder pain as well as lymphedema associated with a sentinel lymph node biopsy.  We discussed the options for treatment of  the breast cancer which included lumpectomy versus a mastectomy. We discussed the performance of the lumpectomy with seed placement. We discussed a 10% chance of a positive margin requiring reexcision in the operating room. We also discussed that she may need radiation therapy or antiestrogen therapy or both if she undergoes lumpectomy. We discussed the mastectomy and the postoperative care for that as well. We discussed that there is no difference in her survival whether she undergoes lumpectomy with radiation therapy or antiestrogen therapy versus a mastectomy.  I do think she will need chemo so will wait for her to see med onc to discuss port placement.  Will see her back after above.         Rolm Bookbinder 01/10/2014, 3:56 PM

## 2014-02-08 NOTE — Anesthesia Procedure Notes (Addendum)
Anesthesia Regional Block:  Pectoralis block  Pre-Anesthetic Checklist: ,, timeout performed, Correct Patient, Correct Site, Correct Laterality, Correct Procedure, Correct Position, site marked, Risks and benefits discussed,  Surgical consent,  Pre-op evaluation,  At surgeon's request and post-op pain management  Laterality: Right  Prep: chloraprep       Needles:   Needle Type: Other     Needle Length: 9cm 9 cm Needle Gauge: 21 and 21 G    Additional Needles:  Procedures: ultrasound guided (picture in chart) Pectoralis block Narrative:  Start time: 02/08/2014 12:49 PM End time: 02/08/2014 12:55 PM Injection made incrementally with aspirations every 5 mL.  Performed by: Personally    Procedure Name: LMA Insertion Date/Time: 02/08/2014 1:52 PM Performed by: Wanita Chamberlain Pre-anesthesia Checklist: Patient identified, Timeout performed, Emergency Drugs available, Suction available and Patient being monitored Patient Re-evaluated:Patient Re-evaluated prior to inductionOxygen Delivery Method: Circle system utilized Preoxygenation: Pre-oxygenation with 100% oxygen Intubation Type: IV induction Ventilation: Mask ventilation without difficulty LMA: LMA inserted LMA Size: 4.0 Number of attempts: 1 Placement Confirmation: breath sounds checked- equal and bilateral Tube secured with: Tape Dental Injury: Teeth and Oropharynx as per pre-operative assessment  Comments: Dentures w/o change from pre-op

## 2014-02-08 NOTE — Interval H&P Note (Signed)
History and Physical Interval Note:  02/08/2014 1:24 PM  [name]  has presented today for surgery, with the diagnosis of right breast cancer  The various methods of treatment have been discussed with the patient and family. After consideration of risks, benefits and other options for treatment, the patient has consented to  Procedure(s): RIGHT RADIOACTIVE SEED GUIDED PARTIAL LUMPECTOMY WITH  RIGHT AXILLARY SENTINEL LYMPH NODE BIOPSY (Right) INSERTION PORT-A-CATH (N/A) as a surgical intervention .  The patient's history has been reviewed, patient examined, no change in status, stable for surgery.  I have reviewed the patient's chart and labs.  Questions were answered to the patient's satisfaction.     Rolm Bookbinder

## 2014-02-08 NOTE — Op Note (Signed)
Preoperative diagnosis: Clinical stage II right breast cancer  Postoperative diagnosis: Same as above  Procedure:  #1 right breast radioactive seed guided lumpectomy  #2 right axillary sentinel lymph node biopsy  #3 left subclavian powerport insertion Surgeon: Dr. Serita Grammes  Anesthesia: Gen. With pectoral block  Estimated blood loss: Minimal  Drains: None  Specimens:  #1 right breast tissue marked with paint  #2 right axillary sentinel lymph node #1 with a count of 425 #3 right axillary tissue Sponge and needle count was correct at completion  Disposition to recovery stable   Indications: This is a 14 yof who had a palpable right breast mass biopsied showing invasive ductal carcinoma. She has been seen in our multidisciplinary clinic and we have decided to proceed with lumpectomy, sentinel node biopsy and port placement.  Procedure: The patient first had a radioactive seed placed in the lesion. I had mammograms in the operating room. After informed consent was obtained she was then injected with technetium in a standard periareolar fashion. She underwent a pectoral block by Dr. Albertina Parr. She was given 2 g of cefazolin. Sequential compression devices were on her legs. She was then placed under general anesthesia without complication. Her right breast was prepped and draped in the standard sterile surgical fashion. A surgical timeout was then performed.I I identified the location of the radioactive seed with the neoprobe. I then made an elliptical incision in the lower portion of the right breast. I then used the neoprobe to guide the excision of the seed and the surrounding tissue with an attempt to get a clear margin. I then removed this. I did a mammogram which confirmed removal of the clip and the seed. This was confirmed by radiology. The seed was present in the specimen by the neoprobe also. This was then sent to pathology. Grossly Dr. Lyndon Code the pathologist thought that my margins were  all clear. I then placed clips in each position around the cavity. 2 clips were placed deep. I then closed the cavity with 2-0 Vicryl. The dermis was closed with 3-0 Vicryl and the skin with 4-0 Monocryl. Dermabond and Steri-Strips were applied. The area was infiltrated with Marcaine.  I was then able to identify the location of the sentinel node. I made a 3 cm incision below the axillary hairline. I carried this through the axillary fascia. I then identified a hot node with the counts as listed above. The background radioactivity was 0. These were passed off the table. Hemostasis was obtained. I then closed the axillary fascia with 2-0 Vicryl. The remainder of the incision was closed with 3-0 Vicryl, 4-0 Monocryl, benzoin and Steri-Strips.  I was able to access her subclavian vein on the first pass. I then placed the wire. This was confirmed to be in position by fluoroscopy. I then made a pocket below this overlying the pectoralis fascia. I then tunneled the line between the sites. I dilated the tract and then placed the peel-away sheath. The line was placed into the peel-away sheath and pulled back into the distal cava.I then attached the port. I secured the port in 2 places with 2-0 Prolene suture. The port flushed easily and aspirated blood. It was packed with heparin. This was then closed with 3-0 Vicryl, 4-0 Monocryl, and Dermabond. She tolerated this well was extubated and transferred to recovery stable.

## 2014-02-08 NOTE — Anesthesia Preprocedure Evaluation (Addendum)
Anesthesia Evaluation  Patient identified by MRN, date of birth, ID band Patient awake    Reviewed: Allergy & Precautions, H&P , NPO status , Patient's Chart, lab work & pertinent test results, reviewed documented beta blocker date and time   Airway Mallampati: II TM Distance: >3 FB Neck ROM: full    Dental  (+) Dental Advisory Given, Teeth Intact, Partial Lower, Upper Dentures   Pulmonary neg pulmonary ROS, former smoker,  breath sounds clear to auscultation  Pulmonary exam normal       Cardiovascular hypertension, On Medications and Pt. on medications Rhythm:regular Rate:Normal     Neuro/Psych Anxiety negative neurological ROS  negative psych ROS   GI/Hepatic negative GI ROS, Neg liver ROS,   Endo/Other  diabetes, Type 2, Oral Hypoglycemic AgentsMorbid obesity  Renal/GU negative Renal ROS  negative genitourinary   Musculoskeletal negative musculoskeletal ROS (+)   Abdominal Normal abdominal exam  (+)   Peds  Hematology negative hematology ROS (+)   Anesthesia Other Findings See surgeon's H&P   Pt requests dentures to remain in place. Dental advisory given  Reproductive/Obstetrics negative OB ROS                       Anesthesia Physical Anesthesia Plan  ASA: III  Anesthesia Plan: General   Post-op Pain Management:    Induction: Intravenous  Airway Management Planned: LMA  Additional Equipment:   Intra-op Plan:   Post-operative Plan:   Informed Consent: I have reviewed the patients History and Physical, chart, labs and discussed the procedure including the risks, benefits and alternatives for the proposed anesthesia with the patient or authorized representative who has indicated his/her understanding and acceptance.   Dental Advisory Given  Plan Discussed with: CRNA and Surgeon  Anesthesia Plan Comments:         Anesthesia Quick Evaluation

## 2014-02-08 NOTE — Progress Notes (Signed)
Nuc med injection done by radiology staff. Pt given fentanyl for sedation and VS remained stable throughout (see doc flowsheets). Family called to bedside (updated/emot support provided)

## 2014-02-08 NOTE — Transfer of Care (Signed)
Immediate Anesthesia Transfer of Care Note  Patient: [name]  Procedure(s) Performed: Procedure(s): RIGHT RADIOACTIVE SEED GUIDED PARTIAL LUMPECTOMY WITH  RIGHT AXILLARY SENTINEL LYMPH NODE BIOPSY (Right) INSERTION PORT-A-CATH (Left)  Patient Location: PACU  Anesthesia Type:General  Level of Consciousness: awake, sedated and patient cooperative  Airway & Oxygen Therapy: Patient Spontanous Breathing and Patient connected to face mask oxygen  Post-op Assessment: Report given to PACU RN and Post -op Vital signs reviewed and stable  Post vital signs: Reviewed and stable  Complications: No apparent anesthesia complications

## 2014-02-09 ENCOUNTER — Encounter (HOSPITAL_BASED_OUTPATIENT_CLINIC_OR_DEPARTMENT_OTHER): Payer: Self-pay | Admitting: General Surgery

## 2014-02-09 LAB — GLUCOSE, CAPILLARY: GLUCOSE-CAPILLARY: 135 mg/dL — AB (ref 70–99)

## 2014-02-17 ENCOUNTER — Telehealth: Payer: Self-pay | Admitting: *Deleted

## 2014-02-17 ENCOUNTER — Other Ambulatory Visit: Payer: Self-pay | Admitting: *Deleted

## 2014-02-17 ENCOUNTER — Telehealth: Payer: Self-pay | Admitting: Oncology

## 2014-02-17 ENCOUNTER — Ambulatory Visit (HOSPITAL_BASED_OUTPATIENT_CLINIC_OR_DEPARTMENT_OTHER): Payer: BC Managed Care – PPO | Admitting: Oncology

## 2014-02-17 VITALS — BP 178/124 | HR 93 | Temp 98.1°F | Resp 18 | Ht 65.0 in | Wt 248.8 lb

## 2014-02-17 DIAGNOSIS — C50919 Malignant neoplasm of unspecified site of unspecified female breast: Secondary | ICD-10-CM

## 2014-02-17 DIAGNOSIS — C50511 Malignant neoplasm of lower-outer quadrant of right female breast: Secondary | ICD-10-CM

## 2014-02-17 DIAGNOSIS — Z171 Estrogen receptor negative status [ER-]: Secondary | ICD-10-CM

## 2014-02-17 DIAGNOSIS — E119 Type 2 diabetes mellitus without complications: Secondary | ICD-10-CM

## 2014-02-17 MED ORDER — LIDOCAINE-PRILOCAINE 2.5-2.5 % EX CREA: 1.0000 "application " | TOPICAL_CREAM | CUTANEOUS | Status: DC | PRN

## 2014-02-17 MED ORDER — LORAZEPAM 0.5 MG PO TABS: 0.5000 mg | ORAL_TABLET | Freq: Every evening | ORAL | Status: DC | PRN

## 2014-02-17 MED ORDER — PROCHLORPERAZINE MALEATE 10 MG PO TABS
10.0000 mg | ORAL_TABLET | Freq: Four times a day (QID) | ORAL | Status: DC | PRN
Start: 2014-02-17 — End: 2014-04-27

## 2014-02-17 MED ORDER — DEXAMETHASONE 4 MG PO TABS: ORAL_TABLET | ORAL | Status: DC

## 2014-02-17 NOTE — Telephone Encounter (Signed)
Per staff message and POF I have scheduled appts. Advised scheduler of appts. JMW  

## 2014-02-17 NOTE — Progress Notes (Signed)
[facility]  [phone] [phone]     ID: [name] OB: [birth year]  MR#: [id number]  IPJ#:[id number]  PCP: Penny Noon, MD GYN:   SU: [name] OTHER MD:  CHIEF COMPLAINT: Triple negative breast cancer CURRENT TREATMENT: Adjuvant chemotherapy  BREAST CANCER HISTORY: From the original intake note 01/18/2014:  The patient had routine bilateral screening mammography at the breast Center for 10/28/2013 showing a possible mass in the right breast. Additional views and right breast ultrasonography 12/20/2013 confirmed an irregular 1.3 cm right breast mass at the 6:00 position. This was not palpable. Ultrasound showed an irregular hypoechoic mass in the right breast measuring 1.4 cm. There was no right axillary adenopathy identified.  On 12/29/2013 the patient underwent right breast biopsy, showing ([id number]) and invasive ductal carcinoma, grade 3, estrogen and progesterone receptor negative, HER-2 not amplified with a signals ratio of 1.35 and a copy number per cell of 2.10, and an MIB-1 of 80%.  On 01/12/2014 the patient underwent bilateral breast MRI. This showed the mass in the right breast lower outer quadrant to measure 2.2 cm maximally. There was a 2 cm well circumscribed mass in the medial right breast which has been stable since 2011 him a and a 1 cm oval mass in the upper midline of the right breast, likely an intramammary lymph node, with benign features. A right axillary lymph node with cortical thickening measured 0.9 cm. The fatty hilum was possibly effaced; note Right axilla had been benign by [place] before biopsy. In the left breast there were no findings of concern.  The patient's subsequent history is as detailed below.  INTERVAL HISTORY: [name] returns today for followup of her triple negative breast cancer. Since her last visit here she underwent right lumpectomy and sentinel lymph node sampling 02/08/2014. The final  pathology from this procedure ([id number]) showed a 2.1 cm invasive ductal carcinoma, grade 3, with the single sentinel lymph node negative. Margins were negative. Repeat prognostic panel was again triple negative, with the HER-2 signals ratio being 1.15 and the number per cell 1.95. A port was placed at the same time as the procedure.  REVIEW OF SYSTEMS:  She did well with the surgery, but has been very anxious and her blood pressure has been "skyhigh". Her blood pressure medications have been adjusted by her primary care physician. She feels a great deal of stress, both personal and financial. She is not going to be able to work on her second job. She is planning to continue to work at her main job, which is a 9-55 days a week. She understands she will need to take some time off to get her treatments and for followup. She is worried that she is binge eating because of her nervousness and she is picking up again and 18 pounds since she had managed to lose before all this started. She is not taking her diabetes as regularly as she should, she says. Otherwise a detailed review of systems today was stable  PAST MEDICAL HISTORY: Past Medical History  Diagnosis Date  . HTN (hypertension)   . Diabetes mellitus   . High cholesterol   . Breast cancer   . Family history of malignant neoplasm of breast   . Glaucoma   . Wears glasses   . Wears dentures     full top-partial bottom    PAST SURGICAL HISTORY: Past Surgical History  Procedure Laterality Date  . Transphenoidal pituitary resection  2006  . Abdominal  hysterectomy  1993  . Breast biopsy  2000    LEFT  . Tonsillectomy    . Portacath placement Left 02/08/2014    Procedure: INSERTION PORT-A-CATH;  Surgeon: Penny Bookbinder, MD;  Location: [facility];  Service: General;  Laterality: Left;    FAMILY HISTORY Family History  Problem Relation Age of Onset  . Hypertension Mother   . Diabetes type II Mother   . Breast cancer  Mother     dx ~69; deceased 74  . Hypertension Father   . Breast cancer Maternal Aunt     deceased 66  . Cancer Maternal Uncle     unk. type; deceased 74s  . Cancer Maternal Uncle     unk. type; deceased late 13s  . Uterine cancer Cousin 52    daughter of an unaffected mat aunt who is 43   the patient's father is in his early 5s. The patient's mother died at the age of 16, from complications of breast cancer which had been diagnosed "years before". The patient had 2 brothers, 2 sisters. The only other breast cancer in the family was a maternal aunt who died from breast cancer at the age of 48. (This means that 2 of 3 sisters, including the patient's mother, had breast cancer, one of them before the age of 37). There is no history of ovarian cancer in the family.  GYNECOLOGIC HISTORY:  Menarche age 81, first live birth age 45. The patient is GX P1. She underwent total abdominal hysterectomy with bilateral salpingo-oophorectomy at age 33. She took hormone replacement for one or 2 months old.  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 The patient is a widow. She lives by herself, with no pets. Her daughter [name] lives in [place], where she works in a Geneticist, molecular. The patient has no grandchildren. She is a Psychologist, forensic    ADVANCED DIRECTIVES: Not in place; advanced directives were discussed with the patient at her initial visit 01/18/2014. She intends to name her daughter as healthcare power of attorney. [name] can be reached at [phone]   HEALTH MAINTENANCE: History  Substance Use Topics  . Smoking status: Former Smoker    Types: Cigarettes    Quit date: 01/31/1994  . Smokeless tobacco: Not on file  . Alcohol Use: Yes     Comment: occ      Colonoscopy:  PAP: Status post hysterectomy  Bone density: Remote  Lipid panel:  No Known Allergies  Current Outpatient Prescriptions  Medication Sig Dispense Refill  . amLODipine (NORVASC) 5 MG tablet Take 5 mg by mouth daily.       . Atorvastatin Calcium (LIPITOR PO) Take 40 mg by mouth daily.       Marland Kitchen dexamethasone (DECADRON) 0.1 % ophthalmic solution Place 1 drop into both eyes daily.      Marland Kitchen glyBURIDE (DIABETA) 5 MG tablet Take 5 mg by mouth daily with breakfast.       . latanoprost (XALATAN) 0.005 % ophthalmic solution Place 1 drop into both eyes at bedtime.      Marland Kitchen lisinopril-hydrochlorothiazide (PRINZIDE,ZESTORETIC) 20-12.5 MG per tablet Take 2 tablets by mouth daily.       Marland Kitchen LORazepam (ATIVAN) 0.5 MG tablet Take 0.5 mg by mouth every 6 (six) hours as  needed for anxiety.      . metFORMIN (GLUCOPHAGE-XR) 500 MG 24 hr tablet Take 500 mg by mouth 2 (two) times daily.       Marland Kitchen oxyCODONE-acetaminophen (PERCOCET) 10-325 MG per tablet Take 1 tablet by mouth every 6 (six) hours as needed for pain.  20 tablet  0  . tiZANidine (ZANAFLEX) 4 MG tablet 4 mg at bedtime. Usually 1/2 tab qhs       No current facility-administered medications for this visit.    OBJECTIVE: Middle-aged [place] American woman who appears stated age  58 Vitals:   02/17/14 1316  BP: 178/124  Pulse: 93  Temp:   Resp: 18     Body mass index is 41.4 kg/(m^2).    ECOG FS:1 - Symptomatic but completely ambulatory  Ocular: Sclerae unicteric,  EOMs intact  Ear-nose-throat: Oropharynx clear and moist Lymphatic: No cervical or supraclavicular adenopathy Lungs no rales or rhonchi Heart regular rate and rhythm Abd soft, obese, nontender, positive bowel sounds MSK no focal spinal tenderness, no upper extremity edema  Neuro: non-focal, well-oriented,  appropriate  affect Breasts:  The right breast is status post lumpectomy throughout inframammary incision. The cosmetic effect is excellent. The wounds are healing  nicely, with no dehiscence, erythema, swelling, or unusual tenderness. The right axilla is benign. The left breast is unremarkable.   LAB RESULTS:  CMP     Component Value Date/Time   NA 141 02/03/2014 1400   NA 142 01/18/2014 1506    I No results found for this basename: SPEP,  UPEP,   kappa and lambda light chains    Lab Results  Component Value Date   WBC 9.5 02/03/2014   NEUTROABS 6.5 02/03/2014   HGB 11.6* 02/03/2014   HCT 37.8 02/03/2014   MCV 84.6 02/03/2014   PLT 280 02/03/2014      Chemistry      Component Value Date/Time   NA 141 02/03/2014 1400   NA 142 01/18/2014 1506      Component Value Date/Time   CALCIUM 10.0 02/03/2014 1400   CALCIUM 9.9 01/18/2014 1506       Lab Results  Component Value Date   LABCA2 51* 02/03/2014    No components found with this basename: [id number]    No results found for this basename: INR,  in the last 168 hours  Urinalysis No results found for this basename: colorurine,  appearanceur,  labspec,  phurine,  glucoseu,  hgbur,  bilirubinur,  ketonesur,  proteinur,  urobilinogen,  nitrite,  leukocytesur    STUDIES: Nm Sentinel Node Inj-no Rpt (breast)  02/08/2014   CLINICAL DATA: right axillary sentinel node biopsy   Sulfur colloid was injected intradermally by the nuclear medicine  technologist for breast cancer sentinel node localization.    Mm Breast Surgical Specimen  02/08/2014   CLINICAL DATA:  Right lumpectomy for invasive mammary carcinoma following radioactive seed localization.  EXAM: SPECIMEN RADIOGRAPH OF THE RIGHT BREAST  COMPARISON:  Previous exam(s)  FINDINGS: Status post excision of the right breast. The radioactive seed, mass and biopsy marker clip are present and are marked for pathology.  IMPRESSION: Specimen radiograph of the right breast.   Electronically Signed   By: Enrique Sack M.D.   On: 02/08/2014 14:54   Dg Chest Port 1 View  02/08/2014   CLINICAL DATA:  Breast carcinoma and status post Port-A-Cath placement.   EXAM: PORTABLE CHEST - 1 VIEW  COMPARISON:  09/02/2005  FINDINGS: Left subclavian Port-A-Cath has been placed with  the catheter tip in the upper SVC. No pneumothorax. The heart is mildly enlarged. No pleural fluid. Bibasilar atelectasis present.  IMPRESSION: No pneumothorax or other acute findings after Port-A-Cath placement. The catheter tip is in the upper SVC.   Electronically Signed   By: Aletta Edouard M.D.   On: 02/08/2014 16:23   Dg Fluoro Guide Cv Line-no Report  02/08/2014   CLINICAL DATA: Port Insertion [facility] OR 8   FLOURO GUIDE CV LINE  Fluoroscopy was utilized by the requesting physician.  No radiographic  interpretation.    Mm Rt Plc Breast Loc Dev   1st Lesion  Inc Mammo Guide  02/02/2014   CLINICAL DATA:  Patient with recent diagnosis right breast invasive carcinoma.  EXAM: MAMMOGRAPHIC GUIDED RADIOACTIVE SEED LOCALIZATION OF THE RIGHT BREAST  COMPARISON:  Previous exam(s)  FINDINGS: Patient presents for radioactive seed localization prior to right breast lumpectomy. I met with the patient and we discussed the procedure of seed localization including benefits and alternatives. We discussed the high likelihood of a successful procedure. We discussed the risks of the procedure including infection, bleeding, tissue injury and further surgery. We discussed the low dose of radioactivity involved in the procedure. Informed, written consent was given.  The usual time-out protocol was performed immediately prior to the procedure.  Using mammographic guidance, sterile technique, 2% lidocaine and an I-125 radioactive seed, biopsy marking clip in right breast mass was localized using a lateral approach. The follow-up mammogram images confirm the seed in the expected location and are marked for Dr. Donne Hazel.  Follow-up survey of the patient confirms presents of radioactive seed, biopsy marking clip and mass.  Order number of I-125 seed:  [id number].  Dose of I-125 seed:  0.250 mCi.  The patient tolerated the  procedure well and was released from the Breast Center. She was given instructions regarding seed removal.  IMPRESSION: Radioactive seed localization right breast. No apparent complications.   Electronically Signed   By: Lovey Newcomer M.D.   On: 02/02/2014 15:11   ASSESSMENT: 58 y.o. BRCA negative [place] woman status post right breast biopsy 12/29/2013 for a clinical T2 N1, stage IIB invasive ductal carcinoma, grade 3, triple negative, with an MIB-1 of 80%.  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 She understands because her cancer was triple negative the only systemic treatment available to her as chemotherapy. We are using the combination of dose dense Adriamycin and Cytoxan with Neulasta support x4, followed by weekly paclitaxel and carboplatin x12. Today we discussed the possible toxicities, side effects and complications of these agents, as well as the possible benefits.  She has a radiated port in place. Her ejection fraction is normal. She is not exercising regularly but I encouraged her to start a walking program, even if only 10 minutes twice a day early and late in the day.  We then went over her supportive medicines and I entered all her antinausea medicines and gave her a prescription for the ones  that could not be mailed in. I also gave her a map of how to take these medications and went over it with her in  detail.  Finally we have scheduled her appointments all the way through August 6, which is when she will see me again to discuss the start of the second part of her chemotherapy, namely the carboplatin/Taxol weekly treatments.  Rosebud has a good understanding of the overall plan. She agrees with it. She knows the goal of treatment in her case is cure She will call with any problems that may develop before the next visit here.   Chauncey Cruel, MD   02/17/2014 1:23 PM

## 2014-02-17 NOTE — Telephone Encounter (Signed)
per pof to sch pt appt-per GM ok to sch in PM-sent emaikl to MW to sch trmts-sent staff message to [place] in reg to 3 appts on pof-adv pt Iwould call with trmt times

## 2014-02-20 NOTE — Addendum Note (Signed)
Addended by: Laureen Abrahams on: 02/20/2014 06:14 PM   Modules accepted: Medications

## 2014-02-21 ENCOUNTER — Other Ambulatory Visit: Payer: Self-pay | Admitting: *Deleted

## 2014-02-21 ENCOUNTER — Encounter (INDEPENDENT_AMBULATORY_CARE_PROVIDER_SITE_OTHER): Payer: BC Managed Care – PPO | Admitting: General Surgery

## 2014-02-22 ENCOUNTER — Telehealth: Payer: Self-pay | Admitting: Oncology

## 2014-02-22 NOTE — Telephone Encounter (Signed)
cld & left pt message for time & date of trmts, appt, lab-adv pt I would mail copy of sch

## 2014-02-27 ENCOUNTER — Telehealth: Payer: Self-pay | Admitting: Hematology and Oncology

## 2014-02-27 ENCOUNTER — Telehealth: Payer: Self-pay

## 2014-02-27 NOTE — Telephone Encounter (Signed)
per pt cld to see what time appt was on 6/25-adv pt 2:00 pm. Pt understood-adv cld 7 left message and mailed copy of sch-pt stated had not recv

## 2014-02-27 NOTE — Telephone Encounter (Signed)
Returned pt call to verify EMLA cream plcmt.  Let pt know EMLA cream needs 1-1.5 hours prior to infusion to work and gave her instructions for application.  Pt states she will probably bring it with her so nurse can verify she is doing it right.  Let pt know that would be acceptable.

## 2014-02-28 ENCOUNTER — Other Ambulatory Visit: Payer: Self-pay | Admitting: Physician Assistant

## 2014-02-28 ENCOUNTER — Other Ambulatory Visit: Payer: Self-pay | Admitting: *Deleted

## 2014-02-28 DIAGNOSIS — C50511 Malignant neoplasm of lower-outer quadrant of right female breast: Secondary | ICD-10-CM

## 2014-03-02 ENCOUNTER — Ambulatory Visit (HOSPITAL_BASED_OUTPATIENT_CLINIC_OR_DEPARTMENT_OTHER): Payer: BC Managed Care – PPO

## 2014-03-02 ENCOUNTER — Other Ambulatory Visit: Payer: Self-pay | Admitting: Hematology and Oncology

## 2014-03-02 ENCOUNTER — Other Ambulatory Visit (HOSPITAL_BASED_OUTPATIENT_CLINIC_OR_DEPARTMENT_OTHER): Payer: BC Managed Care – PPO

## 2014-03-02 ENCOUNTER — Ambulatory Visit (HOSPITAL_BASED_OUTPATIENT_CLINIC_OR_DEPARTMENT_OTHER): Payer: BC Managed Care – PPO | Admitting: Hematology and Oncology

## 2014-03-02 VITALS — BP 123/62 | HR 81

## 2014-03-02 VITALS — BP 190/94 | HR 112 | Temp 98.5°F | Resp 18 | Ht 65.0 in | Wt 250.2 lb

## 2014-03-02 DIAGNOSIS — C50919 Malignant neoplasm of unspecified site of unspecified female breast: Secondary | ICD-10-CM

## 2014-03-02 DIAGNOSIS — Z5111 Encounter for antineoplastic chemotherapy: Secondary | ICD-10-CM

## 2014-03-02 DIAGNOSIS — C50911 Malignant neoplasm of unspecified site of right female breast: Secondary | ICD-10-CM

## 2014-03-02 DIAGNOSIS — C50511 Malignant neoplasm of lower-outer quadrant of right female breast: Secondary | ICD-10-CM

## 2014-03-02 DIAGNOSIS — E119 Type 2 diabetes mellitus without complications: Secondary | ICD-10-CM

## 2014-03-02 DIAGNOSIS — I10 Essential (primary) hypertension: Secondary | ICD-10-CM

## 2014-03-02 DIAGNOSIS — D649 Anemia, unspecified: Secondary | ICD-10-CM

## 2014-03-02 LAB — COMPREHENSIVE METABOLIC PANEL (CC13)
ALBUMIN: 3.8 g/dL (ref 3.5–5.0)
ALK PHOS: 133 U/L (ref 40–150)
ALT: 15 U/L (ref 0–55)
AST: 12 U/L (ref 5–34)
Anion Gap: 12 mEq/L — ABNORMAL HIGH (ref 3–11)
BUN: 18.9 mg/dL (ref 7.0–26.0)
CO2: 24 mEq/L (ref 22–29)
Calcium: 10 mg/dL (ref 8.4–10.4)
Chloride: 103 mEq/L (ref 98–109)
Creatinine: 1.3 mg/dL — ABNORMAL HIGH (ref 0.6–1.1)
Glucose: 182 mg/dl — ABNORMAL HIGH (ref 70–140)
POTASSIUM: 4.4 meq/L (ref 3.5–5.1)
Sodium: 139 mEq/L (ref 136–145)
Total Bilirubin: 0.43 mg/dL (ref 0.20–1.20)
Total Protein: 7.7 g/dL (ref 6.4–8.3)

## 2014-03-02 LAB — CBC WITH DIFFERENTIAL/PLATELET
BASO%: 0.1 % (ref 0.0–2.0)
Basophils Absolute: 0 10*3/uL (ref 0.0–0.1)
EOS%: 3.4 % (ref 0.0–7.0)
Eosinophils Absolute: 0.3 10*3/uL (ref 0.0–0.5)
HEMATOCRIT: 34.3 % — AB (ref 34.8–46.6)
HGB: 10.8 g/dL — ABNORMAL LOW (ref 11.6–15.9)
LYMPH%: 30.4 % (ref 14.0–49.7)
MCH: 26 pg (ref 25.1–34.0)
MCHC: 31.5 g/dL (ref 31.5–36.0)
MCV: 82.7 fL (ref 79.5–101.0)
MONO#: 0.5 10*3/uL (ref 0.1–0.9)
MONO%: 5.9 % (ref 0.0–14.0)
NEUT#: 5.5 10*3/uL (ref 1.5–6.5)
NEUT%: 60.2 % (ref 38.4–76.8)
NRBC: 0 % (ref 0–0)
Platelets: 261 10*3/uL (ref 145–400)
RBC: 4.15 10*6/uL (ref 3.70–5.45)
RDW: 16.1 % — ABNORMAL HIGH (ref 11.2–14.5)
WBC: 9.2 10*3/uL (ref 3.9–10.3)
lymph#: 2.8 10*3/uL (ref 0.9–3.3)

## 2014-03-02 MED ORDER — LORAZEPAM 2 MG/ML IJ SOLN
INTRAMUSCULAR | Status: AC
  Filled 2014-03-02: qty 1

## 2014-03-02 MED ORDER — CLONIDINE HCL 0.1 MG PO TABS
ORAL_TABLET | ORAL | Status: AC
  Filled 2014-03-02: qty 2

## 2014-03-02 MED ORDER — PALONOSETRON HCL INJECTION 0.25 MG/5ML
INTRAVENOUS | Status: AC
  Filled 2014-03-02: qty 5

## 2014-03-02 MED ORDER — DEXAMETHASONE SODIUM PHOSPHATE 20 MG/5ML IJ SOLN
INTRAMUSCULAR | Status: AC
  Filled 2014-03-02: qty 5

## 2014-03-02 MED ORDER — SODIUM CHLORIDE 0.9 % IV SOLN
Freq: Once | INTRAVENOUS | Status: AC
  Administered 2014-03-02: 16:00:00 via INTRAVENOUS

## 2014-03-02 MED ORDER — HEPARIN SOD (PORK) LOCK FLUSH 100 UNIT/ML IV SOLN
500.0000 [IU] | Freq: Once | INTRAVENOUS | Status: AC | PRN
  Administered 2014-03-02: 500 [IU]
  Filled 2014-03-02: qty 5

## 2014-03-02 MED ORDER — SODIUM CHLORIDE 0.9 % IV SOLN
150.0000 mg | Freq: Once | INTRAVENOUS | Status: AC
  Administered 2014-03-02: 150 mg via INTRAVENOUS
  Filled 2014-03-02: qty 5

## 2014-03-02 MED ORDER — PALONOSETRON HCL INJECTION 0.25 MG/5ML
0.2500 mg | Freq: Once | INTRAVENOUS | Status: AC
  Administered 2014-03-02: 0.25 mg via INTRAVENOUS

## 2014-03-02 MED ORDER — DOXORUBICIN HCL CHEMO IV INJECTION 2 MG/ML
60.0000 mg/m2 | Freq: Once | INTRAVENOUS | Status: AC
  Administered 2014-03-02: 136 mg via INTRAVENOUS
  Filled 2014-03-02: qty 68

## 2014-03-02 MED ORDER — DEXAMETHASONE SODIUM PHOSPHATE 20 MG/5ML IJ SOLN
12.0000 mg | Freq: Once | INTRAMUSCULAR | Status: AC
  Administered 2014-03-02: 12 mg via INTRAVENOUS

## 2014-03-02 MED ORDER — LORAZEPAM 2 MG/ML IJ SOLN
0.5000 mg | Freq: Once | INTRAMUSCULAR | Status: AC
  Administered 2014-03-02: 0.5 mg via INTRAVENOUS

## 2014-03-02 MED ORDER — CLONIDINE HCL 0.1 MG PO TABS
0.2000 mg | ORAL_TABLET | Freq: Once | ORAL | Status: AC
  Administered 2014-03-02: 0.2 mg via ORAL

## 2014-03-02 MED ORDER — ONDANSETRON HCL 8 MG PO TABS: 8.0000 mg | ORAL_TABLET | Freq: Three times a day (TID) | ORAL | Status: DC | PRN

## 2014-03-02 MED ORDER — SODIUM CHLORIDE 0.9 % IJ SOLN
10.0000 mL | INTRAMUSCULAR | Status: DC | PRN
  Administered 2014-03-02: 10 mL
  Filled 2014-03-02: qty 10

## 2014-03-02 MED ORDER — SODIUM CHLORIDE 0.9 % IV SOLN
600.0000 mg/m2 | Freq: Once | INTRAVENOUS | Status: AC
  Administered 2014-03-02: 1360 mg via INTRAVENOUS
  Filled 2014-03-02: qty 68

## 2014-03-02 NOTE — Progress Notes (Signed)
1535: Dr. Earnest Conroy notified of pt's BP after 0.2 mg of Clonidine of 182/85. Per MD okay to proceed with treatment.  Verbal order received to give 0.5 mg ativan IVP now for pt's anxiety.

## 2014-03-02 NOTE — Progress Notes (Signed)
[place]  [phone] [phone]) [phone]     ID: [name] OB: [birth year]  MR#: [id number]  XLK#:[id number]  PCP: Chesley Noon, MD GYN:   SU: Rolm Bookbinder OTHER MD:  CHIEF COMPLAINT: Triple negative breast cancer  CURRENT TREATMENT: Adjuvant chemotherapy  BREAST CANCER HISTORY: From the original intake note 01/18/2014:  The patient had routine bilateral screening mammography at the breast Center for 10/28/2013 showing a possible mass in the right breast. Additional views and right breast ultrasonography 12/20/2013 confirmed an irregular 1.3 cm right breast mass at the 6:00 position. This was not palpable. Ultrasound showed an irregular hypoechoic mass in the right breast measuring 1.4 cm. There was no right axillary adenopathy identified.  On 12/29/2013 the patient underwent right breast biopsy, showing ([id number]) and invasive ductal carcinoma, grade 3, estrogen and progesterone receptor negative, HER-2 not amplified with a signals ratio of 1.35 and a copy number per cell of 2.10, and an MIB-1 of 80%.  On 01/12/2014 the patient underwent bilateral breast MRI. This showed the mass in the right breast lower outer quadrant to measure 2.2 cm maximally. There was a 2 cm well circumscribed mass in the medial right breast which has been stable since 2011 him a and a 1 cm oval mass in the upper midline of the right breast, likely an intramammary lymph node, with benign features. A right axillary lymph node with cortical thickening measured 0.9 cm. The fatty hilum was possibly effaced; note Right axilla had been benign by [place] before biopsy. In the left breast there were no findings of concern.  The patient's subsequent history is as detailed below.  INTERVAL HISTORY: [name] returns today for for initiation of cycle 1 chemotherapy with dose dense Adriamycin and Cytoxan for her triple negative breast cancer . She feels anxiety thinking about the  chemotherapy. She denies any shortness of breath, chest pain, palpitations, blood in the stool or blood in the urine. She says she has nausea and vomiting medications. She says that she has been eating chips a lot which is more salt. She says lately she has been eating more and gained little bit of weight. Her blood pressure initially was 199/112 mmHg. Repeat blood pressures are 200/100 mm of mercury. She says that she is on blood pressure medication and she took  As scheduled today  REVIEW OF SYSTEMS: A detailed 10 point review of systems is been assessed and pertinent symptoms as mentioned in interval history    PAST MEDICAL HISTORY: Past Medical History  Diagnosis Date  . HTN (hypertension)   . Diabetes mellitus   . High cholesterol   . Breast cancer   . Family history of malignant neoplasm of breast   . Glaucoma   . Wears glasses   . Wears dentures     full top-partial bottom    PAST SURGICAL HISTORY: Past Surgical History  Procedure Laterality Date  . Transphenoidal pituitary resection  2006  . Abdominal hysterectomy  1993  . Breast biopsy  2000    LEFT  . Tonsillectomy    . Portacath placement Left 02/08/2014    Procedure: INSERTION PORT-A-CATH;  Surgeon: Rolm Bookbinder, MD;  Location: [facility];  Service: General;  Laterality: Left;    FAMILY HISTORY Family History  Problem Relation Age of Onset  . Hypertension Mother   . Diabetes type II Mother   . Breast cancer Mother     dx ~48; deceased 64  . Hypertension Father   .  Breast cancer Maternal Aunt     deceased 17  . Cancer Maternal Uncle     unk. type; deceased 3s  . Cancer Maternal Uncle     unk. type; deceased late 49s  . Uterine cancer Cousin 67    daughter of an unaffected mat aunt who is 63   the patient's father is in his early 40s. The patient's mother died at the age of 73, from complications of breast cancer which had been diagnosed "years before". The patient had 2 brothers, 2  sisters. The only other breast cancer in the family was a maternal aunt who died from breast cancer at the age of 24. (This means that 2 of 3 sisters, including the patient's mother, had breast cancer, one of them before the age of 35). There is no history of ovarian cancer in the family.  GYNECOLOGIC HISTORY:  Menarche age 8, first live birth age 27. The patient is GX P1. She underwent total abdominal hysterectomy with bilateral salpingo-oophorectomy at age 40. She took hormone replacement for one or 2 months old.  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 The patient is a widow. She lives by herself, with no pets. Her daughter [name] lives in [place], where she works in a Geneticist, molecular. The patient has no grandchildren. She is a Psychologist, forensic    ADVANCED DIRECTIVES: Not in place; advanced directives were discussed with the patient at her initial visit 01/18/2014. She intends to name her daughter as healthcare power of attorney. [name] can be reached at [phone]   HEALTH MAINTENANCE: History  Substance Use Topics  . Smoking status: Former Smoker    Types: Cigarettes    Quit date: 01/31/1994  . Smokeless tobacco: Not on file  . Alcohol Use: Yes     Comment: occ     Colonoscopy:  PAP: Status post hysterectomy  Bone density: Remote  Lipid panel:  No Known Allergies  Current Outpatient Prescriptions  Medication Sig Dispense Refill  . amLODipine (NORVASC) 5 MG tablet Take 5 mg by mouth daily.       . Atorvastatin Calcium (LIPITOR PO) Take 40 mg by mouth daily.       Marland Kitchen dexamethasone (DECADRON) 0.1 % ophthalmic solution Place 1 drop into both eyes daily.      Marland Kitchen dexamethasone (DECADRON) 4 MG tablet Take 2 tablets by mouth once a day on the day after  chemotherapy and then take 2 tablets two times a day for 2 days. Take with food.  30 tablet  1  . glyBURIDE (DIABETA) 5 MG tablet Take 5 mg by mouth daily with breakfast.       . latanoprost (XALATAN) 0.005 % ophthalmic solution Place 1 drop into both eyes at bedtime.      . lidocaine-prilocaine (EMLA) cream Apply 1 application topically as needed. Apply over port site 1-2 hours before chemo, then cover with plastic wrap  30 g  0  . lisinopril-hydrochlorothiazide (PRINZIDE,ZESTORETIC) 20-12.5 MG per tablet Take 2 tablets by mouth daily.       Marland Kitchen LORazepam (ATIVAN) 0.5 MG tablet Take 1 tablet (0.5 mg total) by mouth at bedtime as needed (Nausea or vomiting).  30 tablet  0  . metFORMIN (GLUCOPHAGE-XR) 500 MG 24 hr tablet Take 500 mg by  mouth 2 (two) times daily.       Marland Kitchen oxyCODONE-acetaminophen (PERCOCET) 10-325 MG per tablet Take 1 tablet by mouth every 6 (six) hours as needed for pain.  20 tablet  0  . prochlorperazine (COMPAZINE) 10 MG tablet Take 1 tablet (10 mg total) by mouth every 6 (six) hours as needed (Nausea or vomiting).  30 tablet  1  . tiZANidine (ZANAFLEX) 4 MG tablet 4 mg at bedtime. Usually 1/2 tab qhs       No current facility-administered medications for this visit.    OBJECTIVE: Middle-aged [place] American woman who appears stated age. She is obese not in acute distress  Filed Vitals:   03/02/14 1428  BP: 199/112  Pulse: 112  Temp: 98.5 F (36.9 C)  Resp: 18     Body mass index is 41.64 kg/(m^2).    ECOG FS:1 - Symptomatic but completely ambulatory  HEENT PERRLA, sclerae anicteric, conjunctiva no blood, neck supple no JVD no thyromegaly, head atraumatic normocephalic Ear-nose-throat: Oropharynx clear and moist Lymphatic: No cervical or supraclavicular adenopathy Lungs no rales or rhonchi Heart regular rate and rhythm Abd soft, obese, nontender, positive bowel sounds MSK no focal spinal tenderness, no upper extremity edema  Neuro: non-focal, well-oriented,  appropriate   affect Breasts:  The right breast is status post lumpectomy,  in good healing stage. The left breast is unremarkable. No bilateral axillary lymphadenopathy noted Skin: intact, no skin lesions noted  LAB RESULTS:  CMP     Component Value Date/Time   NA 139 03/02/2014 1413   NA 141 02/03/2014 1400    I No results found for this basename: SPEP,  UPEP,   kappa and lambda light chains    Lab Results  Component Value Date   WBC 9.2 03/02/2014   NEUTROABS 5.5 03/02/2014   HGB 10.8* 03/02/2014   HCT 34.3* 03/02/2014   MCV 82.7 03/02/2014   PLT 261 03/02/2014      Chemistry      Component Value Date/Time   NA 139 03/02/2014 1413   NA 141 02/03/2014 1400      Component Value Date/Time   CALCIUM 10.0 03/02/2014 1413   CALCIUM 10.0 02/03/2014 1400       Lab Results  Component Value Date   LABCA2 51* 02/03/2014    No components found with this basename: [id number]    No results found for this basename: INR,  in the last 168 hours  Urinalysis No results found for this basename: colorurine,  appearanceur,  labspec,  phurine,  glucoseu,  hgbur,  bilirubinur,  ketonesur,  proteinur,  urobilinogen,  nitrite,  leukocytesur    STUDIES: Nm Sentinel Node Inj-no Rpt (breast)  02/08/2014   CLINICAL DATA: right axillary sentinel node biopsy   Sulfur colloid was injected intradermally by the nuclear medicine  technologist for breast cancer sentinel node localization.    Mm Breast Surgical Specimen  02/08/2014   CLINICAL DATA:  Right lumpectomy for invasive mammary carcinoma following radioactive seed localization.  EXAM: SPECIMEN RADIOGRAPH OF THE RIGHT BREAST  COMPARISON:  Previous exam(s)  FINDINGS: Status post excision of the right breast. The radioactive seed, mass and biopsy marker clip are present and are marked for pathology.  IMPRESSION: Specimen radiograph of the right breast.   Electronically Signed   By: Enrique Sack M.D.   On: 02/08/2014 14:54   Dg Chest Port 1 View  02/08/2014    CLINICAL DATA:  Breast carcinoma and status post Port-A-Cath placement.  EXAM: PORTABLE  CHEST - 1 VIEW  COMPARISON:  09/02/2005  FINDINGS: Left subclavian Port-A-Cath has been placed with the catheter tip in the upper SVC. No pneumothorax. The heart is mildly enlarged. No pleural fluid. Bibasilar atelectasis present.  IMPRESSION: No pneumothorax or other acute findings after Port-A-Cath placement. The catheter tip is in the upper SVC.   Electronically Signed   By: Irish Lack M.D.   On: 02/08/2014 16:23   Dg Fluoro Guide Cv Line-no Report  02/08/2014   CLINICAL DATA: Port Insertion [facility] OR 8   FLOURO GUIDE CV LINE  Fluoroscopy was utilized by the requesting physician.  No radiographic  interpretation.    Mm Rt Plc Breast Loc Dev   1st Lesion  Inc Mammo Guide  02/02/2014   CLINICAL DATA:  Patient with recent diagnosis right breast invasive carcinoma.  EXAM: MAMMOGRAPHIC GUIDED RADIOACTIVE SEED LOCALIZATION OF THE RIGHT BREAST  COMPARISON:  Previous exam(s)  FINDINGS: Patient presents for radioactive seed localization prior to right breast lumpectomy. I met with the patient and we discussed the procedure of seed localization including benefits and alternatives. We discussed the high likelihood of a successful procedure. We discussed the risks of the procedure including infection, bleeding, tissue injury and further surgery. We discussed the low dose of radioactivity involved in the procedure. Informed, written consent was given.  The usual time-out protocol was performed immediately prior to the procedure.  Using mammographic guidance, sterile technique, 2% lidocaine and an I-125 radioactive seed, biopsy marking clip in right breast mass was localized using a lateral approach. The follow-up mammogram images confirm the seed in the expected location and are marked for Dr. Dwain Sarna.  Follow-up survey of the patient confirms presents of radioactive seed, biopsy marking clip and mass.  Order number of I-125  seed:  [id number].  Dose of I-125 seed:  0.250 mCi.  The patient tolerated the procedure well and was released from the [facility]. She was given instructions regarding seed removal.  IMPRESSION: Radioactive seed localization right breast. No apparent complications.   Electronically Signed   By: Annia Belt M.D.   On: 02/02/2014 15:11   ASSESSMENT: 58 y.o. BRCA negative [place] woman status post right breast biopsy 12/29/2013 for a clinical T2 N1, stage IIB invasive ductal carcinoma, grade 3, triple negative, with an MIB-1 of 80%.  (1) status post right lumpectomy and sentinel lymph node sampling 02/08/2014 for a pT2 pN0, stage IIA invasive ductal carcinoma, grade 3, repeat prognostic panel again triple negative  (2) to start adjuvant chemotherapy 03/02/2014, initially doxorubicin and cyclophosphamide in dose dense fashion x4 with Neulasta day 2, to be followed by carboplatin and paclitaxel weekly x12  (3) adjuvant radiation to follow chemotherapy  (4) status post remote TAH BSO   (5) Status post removal of a pituitary adenoma 09/05/2005, no malignancy identified  (6) Uncontrolled hypertension  (7) Anemia  PLAN: Thekla feels anxiety about her chemotherapy today. Her  blood pressures are uncontrolled even though she is regularly taking her blood pressures medication. Elevation in blood pressures are partly contributed by high salt intake and also anxiety. I have given clonidine 0.2 mg by mouth x1 dose. Her blood pressures decreased to 182/34mmhg followed by 122/68 which are normalized.  I have asked the patient to decrease the salt intake and  to take Ativan 0.5 mg every 12 hours as needed for anxiety. Asked the patient to follow up with primary care physician as soon as possible for adjustment of blood pressure medications. We'll initiate cycle 1  chemotherapy with dose dense AC today followed by Neulasta injection tomorrow. I have once again reiterated the benefits and side effects of  chemotherapy. Patient understood the same and like to proceed with the chemotherapy. She has nausea and vomiting medications prescribed during last visit  In view of uncontrolled hypertension and also diabetes will avoid dexamethasone tablets at this time and will prescribe Zofran 8 mg by mouth every 8 hours as needed for nausea and vomiting   She'll follow up in 1 week for CBC and differential and CMP and for review of chemotoxicities   Aicha has a good understanding of the overall plan. She agrees with it. She will call with any problems that may develop before the next visit here.  Total time spent: 76minutes   Wilmon Arms, MD Medical oncology    03/02/2014 3:01 PM

## 2014-03-02 NOTE — Progress Notes (Signed)
Patient tolerated first time adriamycin and cytoxan without any problems or complications. Patient given copy of AVS and discharged home. Cindi Carbon, RN

## 2014-03-02 NOTE — Patient Instructions (Signed)
[facility] Discharge Instructions for Patients Receiving Chemotherapy  Today you received the following chemotherapy agents Adriamycin and Cytoxan.  To help prevent nausea and vomiting after your treatment, we encourage you to take your nausea medication as prescribed.   If you develop nausea and vomiting that is not controlled by your nausea medication, call the clinic.   BELOW ARE SYMPTOMS THAT SHOULD BE REPORTED IMMEDIATELY:  *FEVER GREATER THAN 100.5 F  *CHILLS WITH OR WITHOUT FEVER  NAUSEA AND VOMITING THAT IS NOT CONTROLLED WITH YOUR NAUSEA MEDICATION  *UNUSUAL SHORTNESS OF BREATH  *UNUSUAL BRUISING OR BLEEDING  TENDERNESS IN MOUTH AND THROAT WITH OR WITHOUT PRESENCE OF ULCERS  *URINARY PROBLEMS  *BOWEL PROBLEMS  UNUSUAL RASH Items with * indicate a potential emergency and should be followed up as soon as possible.  Feel free to call the clinic you have any questions or concerns. The clinic phone number is [phone].   Doxorubicin injection What is this medicine? DOXORUBICIN (dox oh ROO bi sin) is a chemotherapy drug. It is used to treat many kinds of cancer like Hodgkin's disease, leukemia, non-Hodgkin's lymphoma, neuroblastoma, sarcoma, and Wilms' tumor. It is also used to treat bladder cancer, breast cancer, lung cancer, ovarian cancer, stomach cancer, and thyroid cancer. This medicine may be used for other purposes; ask your health care provider or pharmacist if you have questions. COMMON BRAND NAME(S): Adriamycin, Adriamycin PFS, Adriamycin RDF, Rubex What should I tell my health care provider before I take this medicine? They need to know if you have any of these conditions: -blood disorders -heart disease, recent heart attack -infection (especially a virus infection such as chickenpox, cold sores, or herpes) -irregular heartbeat -liver disease -recent or ongoing radiation therapy -an unusual or allergic reaction to doxorubicin, other  chemotherapy agents, other medicines, foods, dyes, or preservatives -pregnant or trying to get pregnant -breast-feeding How should I use this medicine? This drug is given as an infusion into a vein. It is administered in a hospital or clinic by a specially trained health care professional. If you have pain, swelling, burning or any unusual feeling around the site of your injection, tell your health care professional right away. Talk to your pediatrician regarding the use of this medicine in children. Special care may be needed. Overdosage: If you think you have taken too much of this medicine contact a poison control center or emergency room at once. NOTE: This medicine is only for you. Do not share this medicine with others. What if I miss a dose? It is important not to miss your dose. Call your doctor or health care professional if you are unable to keep an appointment. What may interact with this medicine? Do not take this medicine with any of the following medications: -cisapride -droperidol -halofantrine -pimozide -zidovudine This medicine may also interact with the following medications: -chloroquine -chlorpromazine -clarithromycin -cyclophosphamide -cyclosporine -erythromycin -medicines for depression, anxiety, or psychotic disturbances -medicines for irregular heart beat like amiodarone, bepridil, dofetilide, encainide, flecainide, propafenone, quinidine -medicines for seizures like ethotoin, fosphenytoin, phenytoin -medicines for nausea, vomiting like dolasetron, ondansetron, palonosetron -medicines to increase blood counts like filgrastim, pegfilgrastim, sargramostim -methadone -methotrexate -pentamidine -progesterone -vaccines -verapamil Talk to your doctor or health care professional before taking any of these medicines: -acetaminophen -aspirin -ibuprofen -ketoprofen -naproxen This list may not describe all possible interactions. Give your health care provider a  list of all the medicines, herbs, non-prescription drugs, or dietary supplements you use. Also tell them if you smoke, drink alcohol,  or use illegal drugs. Some items may interact with your medicine. What should I watch for while using this medicine? Your condition will be monitored carefully while you are receiving this medicine. You will need important blood work done while you are taking this medicine. This drug may make you feel generally unwell. This is not uncommon, as chemotherapy can affect healthy cells as well as cancer cells. Report any side effects. Continue your course of treatment even though you feel ill unless your doctor tells you to stop. Your urine may turn red for a few days after your dose. This is not blood. If your urine is dark or brown, call your doctor. In some cases, you may be given additional medicines to help with side effects. Follow all directions for their use. Call your doctor or health care professional for advice if you get a fever, chills or sore throat, or other symptoms of a cold or flu. Do not treat yourself. This drug decreases your body's ability to fight infections. Try to avoid being around people who are sick. This medicine may increase your risk to bruise or bleed. Call your doctor or health care professional if you notice any unusual bleeding. Be careful brushing and flossing your teeth or using a toothpick because you may get an infection or bleed more easily. If you have any dental work done, tell your dentist you are receiving this medicine. Avoid taking products that contain aspirin, acetaminophen, ibuprofen, naproxen, or ketoprofen unless instructed by your doctor. These medicines may hide a fever. Men and women of childbearing age should use effective birth control methods while using taking this medicine. Do not become pregnant while taking this medicine. There is a potential for serious side effects to an unborn child. Talk to your health care  professional or pharmacist for more information. Do not breast-feed an infant while taking this medicine. Do not let others touch your urine or other body fluids for 5 days after each treatment with this medicine. Caregivers should wear latex gloves to avoid touching body fluids during this time. There is a maximum amount of this medicine you should receive throughout your life. The amount depends on the medical condition being treated and your overall health. Your doctor will watch how much of this medicine you receive in your lifetime. Tell your doctor if you have taken this medicine before. What side effects may I notice from receiving this medicine? Side effects that you should report to your doctor or health care professional as soon as possible: -allergic reactions like skin rash, itching or hives, swelling of the face, lips, or tongue -low blood counts - this medicine may decrease the number of white blood cells, red blood cells and platelets. You may be at increased risk for infections and bleeding. -signs of infection - fever or chills, cough, sore throat, pain or difficulty passing urine -signs of decreased platelets or bleeding - bruising, pinpoint red spots on the skin, black, tarry stools, blood in the urine -signs of decreased red blood cells - unusually weak or tired, fainting spells, lightheadedness -breathing problems -chest pain -fast, irregular heartbeat -mouth sores -nausea, vomiting -pain, swelling, redness at site where injected -pain, tingling, numbness in the hands or feet -swelling of ankles, feet, or hands -unusual bleeding or bruising Side effects that usually do not require medical attention (report to your doctor or health care professional if they continue or are bothersome): -diarrhea -facial flushing -hair loss -loss of appetite -missed menstrual periods -nail discoloration or  damage -red or watery eyes -red colored urine -stomach upset This list may not  describe all possible side effects. Call your doctor for medical advice about side effects. You may report side effects to FDA at 1-800-FDA-1088. Where should I keep my medicine? This drug is given in a hospital or clinic and will not be stored at home. NOTE: This sheet is a summary. It may not cover all possible information. If you have questions about this medicine, talk to your doctor, pharmacist, or health care provider.  2015, [employer]vier/Gold Standard. (2012-12-21 09:54:34)    Cyclophosphamide injection What is this medicine? CYCLOPHOSPHAMIDE (sye kloe FOSS fa mide) is a chemotherapy drug. It slows the growth of cancer cells. This medicine is used to treat many types of cancer like lymphoma, myeloma, leukemia, breast cancer, and ovarian cancer, to name a few. This medicine may be used for other purposes; ask your health care provider or pharmacist if you have questions. COMMON BRAND NAME(S): Cytoxan, Neosar What should I tell my health care provider before I take this medicine? They need to know if you have any of these conditions: -blood disorders -history of other chemotherapy -infection -kidney disease -liver disease -recent or ongoing radiation therapy -tumors in the bone marrow -an unusual or allergic reaction to cyclophosphamide, other chemotherapy, other medicines, foods, dyes, or preservatives -pregnant or trying to get pregnant -breast-feeding How should I use this medicine? This drug is usually given as an injection into a vein or muscle or by infusion into a vein. It is administered in a hospital or clinic by a specially trained health care professional. Talk to your pediatrician regarding the use of this medicine in children. Special care may be needed. Overdosage: If you think you have taken too much of this medicine contact a poison control center or emergency room at once. NOTE: This medicine is only for you. Do not share this medicine with others. What if I miss a  dose? It is important not to miss your dose. Call your doctor or health care professional if you are unable to keep an appointment. What may interact with this medicine? This medicine may interact with the following medications: -amiodarone -amphotericin B -azathioprine -certain antiviral medicines for HIV or AIDS such as protease inhibitors (e.g., indinavir, ritonavir) and zidovudine -certain blood pressure medications such as benazepril, captopril, enalapril, fosinopril, lisinopril, moexipril, monopril, perindopril, quinapril, ramipril, trandolapril -certain cancer medications such as anthracyclines (e.g., daunorubicin, doxorubicin), busulfan, cytarabine, paclitaxel, pentostatin, tamoxifen, trastuzumab -certain diuretics such as chlorothiazide, chlorthalidone, hydrochlorothiazide, indapamide, metolazone -certain medicines that treat or prevent blood clots like warfarin -certain muscle relaxants such as succinylcholine -cyclosporine -etanercept -indomethacin -medicines to increase blood counts like filgrastim, pegfilgrastim, sargramostim -medicines used as general anesthesia -metronidazole -natalizumab This list may not describe all possible interactions. Give your health care provider a list of all the medicines, herbs, non-prescription drugs, or dietary supplements you use. Also tell them if you smoke, drink alcohol, or use illegal drugs. Some items may interact with your medicine. What should I watch for while using this medicine? Visit your doctor for checks on your progress. This drug may make you feel generally unwell. This is not uncommon, as chemotherapy can affect healthy cells as well as cancer cells. Report any side effects. Continue your course of treatment even though you feel ill unless your doctor tells you to stop. Drink water or other fluids as directed. Urinate often, even at night. In some cases, you may be given additional medicines to help with side  effects. Follow all  directions for their use. Call your doctor or health care professional for advice if you get a fever, chills or sore throat, or other symptoms of a cold or flu. Do not treat yourself. This drug decreases your body's ability to fight infections. Try to avoid being around people who are sick. This medicine may increase your risk to bruise or bleed. Call your doctor or health care professional if you notice any unusual bleeding. Be careful brushing and flossing your teeth or using a toothpick because you may get an infection or bleed more easily. If you have any dental work done, tell your dentist you are receiving this medicine. You may get drowsy or dizzy. Do not drive, use machinery, or do anything that needs mental alertness until you know how this medicine affects you. Do not become pregnant while taking this medicine or for 1 year after stopping it. Women should inform their doctor if they wish to become pregnant or think they might be pregnant. Men should not father a child while taking this medicine and for 4 months after stopping it. There is a potential for serious side effects to an unborn child. Talk to your health care professional or pharmacist for more information. Do not breast-feed an infant while taking this medicine. This medicine may interfere with the ability to have a child. This medicine has caused ovarian failure in some women. This medicine has caused reduced sperm counts in some men. You should talk with your doctor or health care professional if you are concerned about your fertility. If you are going to have surgery, tell your doctor or health care professional that you have taken this medicine. What side effects may I notice from receiving this medicine? Side effects that you should report to your doctor or health care professional as soon as possible: -allergic reactions like skin rash, itching or hives, swelling of the face, lips, or tongue -low blood counts - this medicine may  decrease the number of white blood cells, red blood cells and platelets. You may be at increased risk for infections and bleeding. -signs of infection - fever or chills, cough, sore throat, pain or difficulty passing urine -signs of decreased platelets or bleeding - bruising, pinpoint red spots on the skin, black, tarry stools, blood in the urine -signs of decreased red blood cells - unusually weak or tired, fainting spells, lightheadedness -breathing problems -dark urine -dizziness -palpitations -swelling of the ankles, feet, hands -trouble passing urine or change in the amount of urine -weight gain -yellowing of the eyes or skin Side effects that usually do not require medical attention (report to your doctor or health care professional if they continue or are bothersome): -changes in nail or skin color -hair loss -missed menstrual periods -mouth sores -nausea, vomiting This list may not describe all possible side effects. Call your doctor for medical advice about side effects. You may report side effects to FDA at 1-800-FDA-1088. Where should I keep my medicine? This drug is given in a hospital or clinic and will not be stored at home. NOTE: This sheet is a summary. It may not cover all possible information. If you have questions about this medicine, talk to your doctor, pharmacist, or health care provider.  2015, [employer]/Gold Standard. (2012-07-09 16:22:58)         Hypertension Hypertension, commonly called high blood pressure, is when the force of blood pumping through your arteries is too strong. Your arteries are the blood vessels that carry blood  from your heart throughout your body. A blood pressure reading consists of a higher number over a lower number, such as 110/72. The higher number (systolic) is the pressure inside your arteries when your heart pumps. The lower number (diastolic) is the pressure inside your arteries when your heart relaxes. Ideally you want your  blood pressure below 120/80. Hypertension forces your heart to work harder to pump blood. Your arteries may become narrow or stiff. Having hypertension puts you at risk for heart disease, stroke, and other problems.  RISK FACTORS Some risk factors for high blood pressure are controllable. Others are not.  Risk factors you cannot control include:   Race. You may be at higher risk if you are African American.  Age. Risk increases with age.  Gender. Men are at higher risk than women before age 68 years. After age 42, women are at higher risk than men. Risk factors you can control include:  Not getting enough exercise or physical activity.  Being overweight.  Getting too much fat, sugar, calories, or salt in your diet.  Drinking too much alcohol. SIGNS AND SYMPTOMS Hypertension does not usually cause signs or symptoms. Extremely high blood pressure (hypertensive crisis) may cause headache, anxiety, shortness of breath, and nosebleed. DIAGNOSIS  To check if you have hypertension, your health care provider will measure your blood pressure while you are seated, with your arm held at the level of your heart. It should be measured at least twice using the same arm. Certain conditions can cause a difference in blood pressure between your right and left arms. A blood pressure reading that is higher than normal on one occasion does not mean that you need treatment. If one blood pressure reading is high, ask your health care provider about having it checked again. TREATMENT  Treating high blood pressure includes making lifestyle changes and possibly taking medication. Living a healthy lifestyle can help lower high blood pressure. You may need to change some of your habits. Lifestyle changes may include:  Following the DASH diet. This diet is high in fruits, vegetables, and whole grains. It is low in salt, red meat, and added sugars.  Getting at least 2 1/2 hours of brisk physical activity every  week.  Losing weight if necessary.  Not smoking.  Limiting alcoholic beverages.  Learning ways to reduce stress. If lifestyle changes are not enough to get your blood pressure under control, your health care provider may prescribe medicine. You may need to take more than one. Work closely with your health care provider to understand the risks and benefits. HOME CARE INSTRUCTIONS  Have your blood pressure rechecked as directed by your health care provider.   Only take medicine as directed by your health care provider. Follow the directions carefully. Blood pressure medicines must be taken as prescribed. The medicine does not work as well when you skip doses. Skipping doses also puts you at risk for problems.   Do not smoke.   Monitor your blood pressure at home as directed by your health care provider. SEEK MEDICAL CARE IF:   You think you are having a reaction to medicines taken.  You have recurrent headaches or feel dizzy.  You have swelling in your ankles.  You have trouble with your vision. SEEK IMMEDIATE MEDICAL CARE IF:  You develop a severe headache or confusion.  You have unusual weakness, numbness, or feel faint.  You have severe chest or abdominal pain.  You vomit repeatedly.  You have trouble breathing. MAKE  SURE YOU:   Understand these instructions.  Will watch your condition.  Will get help right away if you are not doing well or get worse. Document Released: 08/25/2005 Document Revised: 08/30/2013 Document Reviewed: 06/17/2013 [facility] Patient Information 2015 [place], [place]. This information is not intended to replace advice given to you by your health care provider. Make sure you discuss any questions you have with your health care provider.

## 2014-03-03 ENCOUNTER — Ambulatory Visit (HOSPITAL_BASED_OUTPATIENT_CLINIC_OR_DEPARTMENT_OTHER): Payer: BC Managed Care – PPO

## 2014-03-03 ENCOUNTER — Telehealth: Payer: Self-pay | Admitting: *Deleted

## 2014-03-03 VITALS — BP 162/89 | HR 99 | Temp 97.8°F

## 2014-03-03 DIAGNOSIS — Z5189 Encounter for other specified aftercare: Secondary | ICD-10-CM

## 2014-03-03 DIAGNOSIS — C50511 Malignant neoplasm of lower-outer quadrant of right female breast: Secondary | ICD-10-CM

## 2014-03-03 DIAGNOSIS — C50919 Malignant neoplasm of unspecified site of unspecified female breast: Secondary | ICD-10-CM

## 2014-03-03 MED ORDER — PEGFILGRASTIM INJECTION 6 MG/0.6ML
6.0000 mg | Freq: Once | SUBCUTANEOUS | Status: AC
  Administered 2014-03-03: 6 mg via SUBCUTANEOUS
  Filled 2014-03-03: qty 0.6

## 2014-03-03 NOTE — Telephone Encounter (Signed)
Called [name] for chemotherapy F/U.  Patient is doing well.  Denies n/v.  Denies any new side effects or symptoms.  Bowel and bladder is functioning well.  Eating and drinking well and I instructed to drink 64 oz minimum daily or at least the day before, of and after treatment.  Denies questions at this time and encouraged to call if needed.  Reviewed how to call after hours in the case of an emergency.

## 2014-03-03 NOTE — Telephone Encounter (Signed)
NO PROBLEMS OR CONCERNS AT THIS TIME. PT. WILL CALL THIS OFFICE IF THE NEED ARISES.

## 2014-03-03 NOTE — Patient Instructions (Signed)

## 2014-03-03 NOTE — Telephone Encounter (Signed)
Message copied by Cherylynn Ridges on Fri Mar 03, 2014  4:07 PM ------      Message from: Oliver Hum      Created: Thu Mar 02, 2014  4:07 PM      Regarding: First chemo follow up call      Contact: [phone]       First time Adriamycin and Cytoxan. Dr. Jana Hakim. ------

## 2014-03-04 ENCOUNTER — Other Ambulatory Visit: Payer: Self-pay | Admitting: Oncology

## 2014-03-07 ENCOUNTER — Encounter (INDEPENDENT_AMBULATORY_CARE_PROVIDER_SITE_OTHER): Payer: Self-pay | Admitting: General Surgery

## 2014-03-07 ENCOUNTER — Ambulatory Visit (INDEPENDENT_AMBULATORY_CARE_PROVIDER_SITE_OTHER): Payer: BC Managed Care – PPO | Admitting: General Surgery

## 2014-03-07 VITALS — BP 200/88 | HR 84 | Resp 18 | Ht 65.5 in | Wt 249.0 lb

## 2014-03-07 DIAGNOSIS — Z09 Encounter for follow-up examination after completed treatment for conditions other than malignant neoplasm: Secondary | ICD-10-CM

## 2014-03-07 DIAGNOSIS — C50919 Malignant neoplasm of unspecified site of unspecified female breast: Secondary | ICD-10-CM

## 2014-03-07 DIAGNOSIS — C50911 Malignant neoplasm of unspecified site of right female breast: Secondary | ICD-10-CM

## 2014-03-07 NOTE — Addendum Note (Signed)
Addended by: Illene Regulus on: 03/07/2014 02:25 PM   Modules accepted: Orders

## 2014-03-07 NOTE — Progress Notes (Addendum)
Subjective:     Patient ID: [name], female   DOB: March 02, 1956, 58 y.o.   MRN: [id number]  HPI This is a 58 six-year-old female who had elevated blood pressure or coronary surgery he moved but eventually she underwent a right breast lumpectomy and sentinel lymph node biopsy with placement of a Port-A-Cath at the same time. Her blood pressure is 200/88 today. She has no symptoms referable to this. 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 A single sentinel lymph node is negative. This is a triple negative for tumor. She has gotten her first cycle of chemotherapy and is doing fairly well with this.  Review of Systems     Objective:   Physical Exam Port site with stitch exposed that I removed, no infection Healing right breast and axillary incisions without infection    Assessment:     Stage II right tnbc     Plan:     I will refer her to see physical therapy. She'll return to full activity. She is going to complete her chemotherapy and radiation I'll plan on seeing her back in 6 months.      She is also going to call Dr Melford Aase today to get in to see him again for her blood pressure.

## 2014-03-08 ENCOUNTER — Other Ambulatory Visit: Payer: Self-pay | Admitting: *Deleted

## 2014-03-08 ENCOUNTER — Ambulatory Visit (HOSPITAL_BASED_OUTPATIENT_CLINIC_OR_DEPARTMENT_OTHER): Payer: BC Managed Care – PPO | Admitting: Oncology

## 2014-03-08 ENCOUNTER — Other Ambulatory Visit (HOSPITAL_BASED_OUTPATIENT_CLINIC_OR_DEPARTMENT_OTHER): Payer: BC Managed Care – PPO

## 2014-03-08 ENCOUNTER — Telehealth: Payer: Self-pay | Admitting: *Deleted

## 2014-03-08 ENCOUNTER — Telehealth: Payer: Self-pay | Admitting: Adult Health

## 2014-03-08 ENCOUNTER — Encounter: Payer: Self-pay | Admitting: Oncology

## 2014-03-08 VITALS — BP 190/106 | HR 114 | Temp 99.0°F | Resp 18 | Ht 65.0 in | Wt 250.1 lb

## 2014-03-08 DIAGNOSIS — R030 Elevated blood-pressure reading, without diagnosis of hypertension: Secondary | ICD-10-CM

## 2014-03-08 DIAGNOSIS — D702 Other drug-induced agranulocytosis: Secondary | ICD-10-CM

## 2014-03-08 DIAGNOSIS — Z6841 Body Mass Index (BMI) 40.0 and over, adult: Secondary | ICD-10-CM

## 2014-03-08 DIAGNOSIS — E119 Type 2 diabetes mellitus without complications: Secondary | ICD-10-CM

## 2014-03-08 DIAGNOSIS — C50919 Malignant neoplasm of unspecified site of unspecified female breast: Secondary | ICD-10-CM

## 2014-03-08 DIAGNOSIS — C50511 Malignant neoplasm of lower-outer quadrant of right female breast: Secondary | ICD-10-CM

## 2014-03-08 DIAGNOSIS — C50911 Malignant neoplasm of unspecified site of right female breast: Secondary | ICD-10-CM

## 2014-03-08 DIAGNOSIS — Z171 Estrogen receptor negative status [ER-]: Secondary | ICD-10-CM

## 2014-03-08 LAB — CBC WITH DIFFERENTIAL/PLATELET
BASO%: 1 % (ref 0.0–2.0)
Basophils Absolute: 0 10*3/uL (ref 0.0–0.1)
EOS%: 11.6 % — ABNORMAL HIGH (ref 0.0–7.0)
Eosinophils Absolute: 0.2 10*3/uL (ref 0.0–0.5)
HCT: 32 % — ABNORMAL LOW (ref 34.8–46.6)
HGB: 10.2 g/dL — ABNORMAL LOW (ref 11.6–15.9)
LYMPH%: 47.8 % (ref 14.0–49.7)
MCH: 26.3 pg (ref 25.1–34.0)
MCHC: 31.8 g/dL (ref 31.5–36.0)
MCV: 82.8 fL (ref 79.5–101.0)
MONO#: 0.1 10*3/uL (ref 0.1–0.9)
MONO%: 4.3 % (ref 0.0–14.0)
NEUT#: 0.7 10*3/uL — ABNORMAL LOW (ref 1.5–6.5)
NEUT%: 35.3 % — ABNORMAL LOW (ref 38.4–76.8)
PLATELETS: 205 10*3/uL (ref 145–400)
RBC: 3.86 10*6/uL (ref 3.70–5.45)
RDW: 16.6 % — ABNORMAL HIGH (ref 11.2–14.5)
WBC: 2 10*3/uL — AB (ref 3.9–10.3)
lymph#: 0.9 10*3/uL (ref 0.9–3.3)

## 2014-03-08 LAB — COMPREHENSIVE METABOLIC PANEL (CC13)
ALK PHOS: 144 U/L (ref 40–150)
ALT: 15 U/L (ref 0–55)
ANION GAP: 12 meq/L — AB (ref 3–11)
AST: 12 U/L (ref 5–34)
Albumin: 3.7 g/dL (ref 3.5–5.0)
BILIRUBIN TOTAL: 0.44 mg/dL (ref 0.20–1.20)
BUN: 27.5 mg/dL — ABNORMAL HIGH (ref 7.0–26.0)
CO2: 23 mEq/L (ref 22–29)
CREATININE: 1.3 mg/dL — AB (ref 0.6–1.1)
Calcium: 9.5 mg/dL (ref 8.4–10.4)
Chloride: 103 mEq/L (ref 98–109)
Glucose: 230 mg/dl — ABNORMAL HIGH (ref 70–140)
Potassium: 4.9 mEq/L (ref 3.5–5.1)
Sodium: 138 mEq/L (ref 136–145)
TOTAL PROTEIN: 7.1 g/dL (ref 6.4–8.3)

## 2014-03-08 MED ORDER — CIPROFLOXACIN HCL 250 MG PO TABS: 250.0000 mg | ORAL_TABLET | Freq: Two times a day (BID) | ORAL | Status: DC

## 2014-03-08 NOTE — Telephone Encounter (Signed)
Per staff message and POF I have scheduled appts. Advised scheduler of appts. JMW  

## 2014-03-08 NOTE — Progress Notes (Signed)
OFFICE PROGRESS NOTE   03/08/2014   Physicians: G.Magrinat, Anastasia Pall (PCP), M.Wakefield  INTERVAL HISTORY:  Patient is seen, alone for visit and for first time by this MD/ in Dr Magrinat's absence, receiving adjuvant dose dense chemotherapy for T2N0 triple negative right breast cancer. She had cycle 1 dose dense adriamycin cytoxan on 03-02-14 with neulasta on 03-03-14 and is neutropenic today (day 7 cycle 1). She has had slight clear rhinorrhea today, but no fever and no other symptoms of infection. She otherwise did well with the first chemotherapy thus far, with no nausea (did use antiemetics as directed after chemo), slight constipation resolved with miralax, no marked fatigue and no extreme aches with neulasta. She is diabetic but is not checking blood sugars at home. She is to see PCP Dr Melford Aase with [facility] on 03-15-14 re elevated blood pressures (phone [phone], fax [phone]); she is on norvasc 5 mg + lisinopril HCTZ 20-12.5 daily since prior to start of chemotherapy. 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  She has PAC.  ONCOLOGIC HISTORY The patient had routine bilateral screening mammography at the [facility] 10/28/2013 showing a possible mass in the right breast. Additional views and right breast ultrasonography 12/20/2013 confirmed an irregular 1.3 cm right breast mass at 6:00,  not palpable. Ultrasound showed an irregular hypoechoic mass in the right breast measuring 1.4 cm. There was no right axillary adenopathy identified.  On 12/29/2013 the patient underwent right breast biopsy, showing ([id number]) and invasive ductal carcinoma, grade 3, estrogen and progesterone receptor negative, HER-2 not amplified with a signals ratio of 1.35 and a copy number per cell of 2.10, and an MIB-1 of 80%.  On 01/12/2014 the patient underwent bilateral breast MRI. This showed the mass in the right breast lower outer quadrant  to measure 2.2 cm maximally. There was a 2 cm well circumscribed mass in the medial right breast which has been stable since 2011 him a and a 1 cm oval mass in the upper midline of the right breast, likely an intramammary lymph node, with benign features. A right axillary lymph node with cortical thickening measured 0.9 cm. The fatty hilum was possibly effaced; note Right axilla had been benign by [place] before biopsy. In the left breast there were no findings of concern. She had right lumpectomy and sentinel lymph node sampling 02/08/2014. The final pathology  ([id number]) showed a 2.1 cm invasive ductal carcinoma, grade 3, with the single sentinel lymph node negative. Margins were negative. Repeat prognostic panel was again triple negative, with the HER-2 signals ratio being 1.15 and the number per cell 1.95. A port was placed at the same time as the procedure. Echocardiogram 01-26-14 had EF 50-55% with normal wall motion. She began adjuvant chemotherapy with dose dense AC on 03-02-14, with neulasta on 03-03-14. She was neutropenic by day 7 cycle 1.  Review of systems as above, also: Appetite and taste ok. No problems with PAC. Able to sleep. No SOB or cough. No new or different pain. No bleeding. No LE swelling.  Remainder of 10 point Review of Systems negative.  Objective:  Vital signs in last 24 hours:  BP 190/106  Pulse 114  Temp(Src) 99 F (37.2 C) (Oral)  Resp 18  Ht $R'5\' 5"'XC$  (1.651 m)  Wt 250 lb 1.6 oz (113.445 kg)  BMI 41.62 kg/m2  Alert, oriented and appropriate. Ambulatory without difficulty. Very pleasant and talkative, looks comfortable, neatly  dressed. No alopecia  HEENT:PERRL, sclerae not icteric. Oral mucosa moist without lesions, posterior pharynx clear. Full dentures.Nasal turbinates not boggy.Neck supple. No JVD.  Lymphatics:no cervical,suraclavicular, axillary  adenopathy Resp: clear to auscultation bilaterally and normal percussion bilaterally Cardio: regular rate and rhythm.  No gallop. GI: abdomen obese, soft, nontender, not distended, no mass or organomegaly. Normally active bowel sounds.  Musculoskeletal/ Extremities: without pitting edema, cords, tenderness Neuro: nonfocal. PSYCH: mood and affect appropriate Skin without rash, ecchymosis, petechiae Breasts: did not undress for repeat breast exam now Portacath-without erythema or tenderness  Lab Results:  Results for orders placed in visit on 03/08/14  CBC WITH DIFFERENTIAL      Result Value Ref Range   WBC 2.0 (*) 3.9 - 10.3 10e3/uL   NEUT# 0.7 (*) 1.5 - 6.5 10e3/uL   HGB 10.2 (*) 11.6 - 15.9 g/dL   HCT 32.0 (*) 34.8 - 46.6 %   Platelets 205  145 - 400 10e3/uL   MCV 82.8  79.5 - 101.0 fL   MCH 26.3  25.1 - 34.0 pg   MCHC 31.8  31.5 - 36.0 g/dL   RBC 3.86  3.70 - 5.45 10e6/uL   RDW 16.6 (*) 11.2 - 14.5 %   lymph# 0.9  0.9 - 3.3 10e3/uL   MONO# 0.1  0.1 - 0.9 10e3/uL   Eosinophils Absolute 0.2  0.0 - 0.5 10e3/uL   Basophils Absolute 0.0  0.0 - 0.1 10e3/uL   NEUT% 35.3 (*) 38.4 - 76.8 %   LYMPH% 47.8  14.0 - 49.7 %   MONO% 4.3  0.0 - 14.0 %   EOS% 11.6 (*) 0.0 - 7.0 %   BASO% 1.0  0.0 - 2.0 %  COMPREHENSIVE METABOLIC PANEL (WU98)      Result Value Ref Range   Sodium 138  136 - 145 mEq/L   Potassium 4.9  3.5 - 5.1 mEq/L   Chloride 103  98 - 109 mEq/L   CO2 23  22 - 29 mEq/L   Glucose 230 (*) 70 - 140 mg/dl   BUN 27.5 (*) 7.0 - 26.0 mg/dL   Creatinine 1.3 (*) 0.6 - 1.1 mg/dL   Total Bilirubin 0.44  0.20 - 1.20 mg/dL   Alkaline Phosphatase 144  40 - 150 U/L   AST 12  5 - 34 U/L   ALT 15  0 - 55 U/L   Total Protein 7.1  6.4 - 8.3 g/dL   Albumin 3.7  3.5 - 5.0 g/dL   Calcium 9.5  8.4 - 10.4 mg/dL   Anion Gap 12 (*) 3 - 11 mEq/L     Studies/Results:  No results found.  Medications: I have reviewed the patient's current medications. WIll use cipro 250 mg bid until [place] out of neutropenic range, sent to [facility] to begin today.  DISCUSSION: reviewed CBC as above and  instructed on neutropenic precautions, reviewed also by RN. Will repeat CBC on 03-13-14, RN to let her know then if counts recovered and if ok to stop cipro.  Encouraged her to check blood sugars especially for few days after each chemo, as steroids may well cause significant elevation in glucose.  I have spoken with RN at Dr Fayrene Fearing office, who was to relay message to APP as Dr Melford Aase away today, re elevated blood pressures, as high as 190/106 with first chemo. That office was to be back in touch with this MD if they preferred I adjust medication now ahead of her visit to Dr  Badger on 03-15-14, or would be in contact directly with patient if they want to give recommendations now.  Assessment/Plan: 1.T2N0 grade 3 triple negative invasive ductal carcinoma of right breast: post right lumpectomy with single sentinel node 02-08-14 and cycle 1 adjuvant dose dense AC given 03-02-14 with neulasta on 03-03-14. Neutropenic but not febrile today, plan as above. Will recheck CBC on 03-13-14 and stop cipro then if [place] >=1.0. She will see APP with repeat labs and cycle 2 AC on 03-16-14 as long as counts have recovered. 2.Diabetes on metformin: glucose 230 today, no Hgb A1c in this system. To see PCP next week. Strongly enourage following blood sugars at home especially with steroids used around each chemo treatment; she may need another glucometer and should discuss this with PCP. 3.elevated blood pressures: Particularly concerning during chemotherapy administration. I spoke directly with primary care office during her visit today, as above. 4.obesity, BMI 41.7 5. Abdominal hysterectomy 1993 6.pituitary resection 2006 7.PAC in    Patient followed discussion well, understood instructions and is in agreement with plan above. Time spent 30 min including >50% counseling and coordination of care.   Gordy Levan, MD   03/08/2014, 4:48 PM

## 2014-03-08 NOTE — Telephone Encounter (Signed)
per pof to sch appt&7 trmt for 7/23-MW added-gave pt copy of updated sch

## 2014-03-11 ENCOUNTER — Other Ambulatory Visit: Payer: Self-pay | Admitting: Oncology

## 2014-03-11 DIAGNOSIS — D701 Agranulocytosis secondary to cancer chemotherapy: Secondary | ICD-10-CM

## 2014-03-11 DIAGNOSIS — T451X5A Adverse effect of antineoplastic and immunosuppressive drugs, initial encounter: Principal | ICD-10-CM

## 2014-03-13 ENCOUNTER — Other Ambulatory Visit (HOSPITAL_BASED_OUTPATIENT_CLINIC_OR_DEPARTMENT_OTHER): Payer: BC Managed Care – PPO

## 2014-03-13 ENCOUNTER — Telehealth: Payer: Self-pay | Admitting: *Deleted

## 2014-03-13 ENCOUNTER — Telehealth: Payer: Self-pay

## 2014-03-13 DIAGNOSIS — C50511 Malignant neoplasm of lower-outer quadrant of right female breast: Secondary | ICD-10-CM

## 2014-03-13 DIAGNOSIS — C50919 Malignant neoplasm of unspecified site of unspecified female breast: Secondary | ICD-10-CM

## 2014-03-13 LAB — CBC WITH DIFFERENTIAL/PLATELET
BASO%: 0.7 % (ref 0.0–2.0)
BASOS ABS: 0.1 10*3/uL (ref 0.0–0.1)
EOS%: 1.1 % (ref 0.0–7.0)
Eosinophils Absolute: 0.1 10*3/uL (ref 0.0–0.5)
HEMATOCRIT: 31.3 % — AB (ref 34.8–46.6)
HEMOGLOBIN: 9.8 g/dL — AB (ref 11.6–15.9)
LYMPH#: 1.4 10*3/uL (ref 0.9–3.3)
LYMPH%: 18.7 % (ref 14.0–49.7)
MCH: 25.8 pg (ref 25.1–34.0)
MCHC: 31.3 g/dL — AB (ref 31.5–36.0)
MCV: 82.4 fL (ref 79.5–101.0)
MONO#: 0.8 10*3/uL (ref 0.1–0.9)
MONO%: 10.9 % (ref 0.0–14.0)
NEUT#: 5.2 10*3/uL (ref 1.5–6.5)
NEUT%: 68.6 % (ref 38.4–76.8)
PLATELETS: 173 10*3/uL (ref 145–400)
RBC: 3.8 10*6/uL (ref 3.70–5.45)
RDW: 15.9 % — ABNORMAL HIGH (ref 11.2–14.5)
WBC: 7.6 10*3/uL (ref 3.9–10.3)

## 2014-03-13 LAB — COMPREHENSIVE METABOLIC PANEL (CC13)
ALT: 20 U/L (ref 0–55)
AST: 17 U/L (ref 5–34)
Albumin: 3.8 g/dL (ref 3.5–5.0)
Alkaline Phosphatase: 124 U/L (ref 40–150)
Anion Gap: 11 meq/L (ref 3–11)
BUN: 20.3 mg/dL (ref 7.0–26.0)
CO2: 21 meq/L — ABNORMAL LOW (ref 22–29)
Calcium: 9.5 mg/dL (ref 8.4–10.4)
Chloride: 103 meq/L (ref 98–109)
Creatinine: 1.3 mg/dL — ABNORMAL HIGH (ref 0.6–1.1)
Glucose: 212 mg/dL — ABNORMAL HIGH (ref 70–140)
Potassium: 4.3 meq/L (ref 3.5–5.1)
Sodium: 135 meq/L — ABNORMAL LOW (ref 136–145)
Total Bilirubin: <0.2 mg/dL (ref 0.20–1.20)
Total Protein: 7.4 g/dL (ref 6.4–8.3)

## 2014-03-13 NOTE — Telephone Encounter (Signed)
No entry 

## 2014-03-13 NOTE — Telephone Encounter (Signed)
Left a message for [name] that her neutrophils are good today at 5.2 so Dr. Marko Plume said that she can stop the Cipro  Antibiotic.  Keep appointment with Enzo Montgomery on 03-16-14 as scheduled.

## 2014-03-15 ENCOUNTER — Other Ambulatory Visit: Payer: Self-pay | Admitting: *Deleted

## 2014-03-15 DIAGNOSIS — C50511 Malignant neoplasm of lower-outer quadrant of right female breast: Secondary | ICD-10-CM

## 2014-03-16 ENCOUNTER — Ambulatory Visit (HOSPITAL_BASED_OUTPATIENT_CLINIC_OR_DEPARTMENT_OTHER): Payer: BC Managed Care – PPO | Admitting: Adult Health

## 2014-03-16 ENCOUNTER — Encounter: Payer: Self-pay | Admitting: Adult Health

## 2014-03-16 ENCOUNTER — Other Ambulatory Visit: Payer: Self-pay | Admitting: Oncology

## 2014-03-16 ENCOUNTER — Other Ambulatory Visit (HOSPITAL_BASED_OUTPATIENT_CLINIC_OR_DEPARTMENT_OTHER): Payer: BC Managed Care – PPO

## 2014-03-16 ENCOUNTER — Ambulatory Visit (HOSPITAL_BASED_OUTPATIENT_CLINIC_OR_DEPARTMENT_OTHER): Payer: BC Managed Care – PPO

## 2014-03-16 VITALS — BP 156/90 | HR 109 | Temp 98.4°F | Resp 18 | Ht 65.0 in | Wt 250.8 lb

## 2014-03-16 DIAGNOSIS — C50919 Malignant neoplasm of unspecified site of unspecified female breast: Secondary | ICD-10-CM

## 2014-03-16 DIAGNOSIS — C50511 Malignant neoplasm of lower-outer quadrant of right female breast: Secondary | ICD-10-CM

## 2014-03-16 DIAGNOSIS — Z5111 Encounter for antineoplastic chemotherapy: Secondary | ICD-10-CM

## 2014-03-16 DIAGNOSIS — I10 Essential (primary) hypertension: Secondary | ICD-10-CM

## 2014-03-16 DIAGNOSIS — D649 Anemia, unspecified: Secondary | ICD-10-CM

## 2014-03-16 DIAGNOSIS — Z171 Estrogen receptor negative status [ER-]: Secondary | ICD-10-CM

## 2014-03-16 LAB — COMPREHENSIVE METABOLIC PANEL (CC13)
ALBUMIN: 3.9 g/dL (ref 3.5–5.0)
ALK PHOS: 150 U/L (ref 40–150)
ALT: 22 U/L (ref 0–55)
AST: 13 U/L (ref 5–34)
Anion Gap: 13 mEq/L — ABNORMAL HIGH (ref 3–11)
BUN: 24.9 mg/dL (ref 7.0–26.0)
CO2: 20 mEq/L — ABNORMAL LOW (ref 22–29)
Calcium: 9.7 mg/dL (ref 8.4–10.4)
Chloride: 105 mEq/L (ref 98–109)
Creatinine: 1.4 mg/dL — ABNORMAL HIGH (ref 0.6–1.1)
GLUCOSE: 288 mg/dL — AB (ref 70–140)
Potassium: 4.8 mEq/L (ref 3.5–5.1)
SODIUM: 139 meq/L (ref 136–145)
TOTAL PROTEIN: 7.7 g/dL (ref 6.4–8.3)
Total Bilirubin: <0.2 mg/dL (ref 0.20–1.20)

## 2014-03-16 LAB — CBC WITH DIFFERENTIAL/PLATELET
BASO%: 0.4 % (ref 0.0–2.0)
Basophils Absolute: 0 10*3/uL (ref 0.0–0.1)
EOS%: 0.5 % (ref 0.0–7.0)
Eosinophils Absolute: 0.1 10*3/uL (ref 0.0–0.5)
HCT: 33.1 % — ABNORMAL LOW (ref 34.8–46.6)
HGB: 10.4 g/dL — ABNORMAL LOW (ref 11.6–15.9)
LYMPH%: 15 % (ref 14.0–49.7)
MCH: 26.1 pg (ref 25.1–34.0)
MCHC: 31.4 g/dL — ABNORMAL LOW (ref 31.5–36.0)
MCV: 83.2 fL (ref 79.5–101.0)
MONO#: 1.1 10*3/uL — ABNORMAL HIGH (ref 0.1–0.9)
MONO%: 10.8 % (ref 0.0–14.0)
NEUT#: 7.5 10*3/uL — ABNORMAL HIGH (ref 1.5–6.5)
NEUT%: 73.3 % (ref 38.4–76.8)
Platelets: 223 10*3/uL (ref 145–400)
RBC: 3.98 10*6/uL (ref 3.70–5.45)
RDW: 16.3 % — AB (ref 11.2–14.5)
WBC: 10.2 10*3/uL (ref 3.9–10.3)
lymph#: 1.5 10*3/uL (ref 0.9–3.3)

## 2014-03-16 MED ORDER — SODIUM CHLORIDE 0.9 % IV SOLN
600.0000 mg/m2 | Freq: Once | INTRAVENOUS | Status: AC
  Administered 2014-03-16: 1360 mg via INTRAVENOUS
  Filled 2014-03-16: qty 68

## 2014-03-16 MED ORDER — SODIUM CHLORIDE 0.9 % IJ SOLN
10.0000 mL | INTRAMUSCULAR | Status: DC | PRN
  Administered 2014-03-16: 10 mL
  Filled 2014-03-16: qty 10

## 2014-03-16 MED ORDER — DEXAMETHASONE SODIUM PHOSPHATE 20 MG/5ML IJ SOLN
12.0000 mg | Freq: Once | INTRAMUSCULAR | Status: AC
  Administered 2014-03-16: 12 mg via INTRAVENOUS

## 2014-03-16 MED ORDER — SODIUM CHLORIDE 0.9 % IV SOLN
Freq: Once | INTRAVENOUS | Status: AC
  Administered 2014-03-16: 16:00:00 via INTRAVENOUS

## 2014-03-16 MED ORDER — PALONOSETRON HCL INJECTION 0.25 MG/5ML
0.2500 mg | Freq: Once | INTRAVENOUS | Status: AC
  Administered 2014-03-16: 0.25 mg via INTRAVENOUS

## 2014-03-16 MED ORDER — HEPARIN SOD (PORK) LOCK FLUSH 100 UNIT/ML IV SOLN
500.0000 [IU] | Freq: Once | INTRAVENOUS | Status: AC | PRN
  Administered 2014-03-16: 500 [IU]
  Filled 2014-03-16: qty 5

## 2014-03-16 MED ORDER — SODIUM CHLORIDE 0.9 % IV SOLN
150.0000 mg | Freq: Once | INTRAVENOUS | Status: AC
  Administered 2014-03-16: 150 mg via INTRAVENOUS
  Filled 2014-03-16: qty 5

## 2014-03-16 MED ORDER — DOXORUBICIN HCL CHEMO IV INJECTION 2 MG/ML
60.0000 mg/m2 | Freq: Once | INTRAVENOUS | Status: AC
  Administered 2014-03-16: 136 mg via INTRAVENOUS
  Filled 2014-03-16: qty 68

## 2014-03-16 MED ORDER — DEXAMETHASONE SODIUM PHOSPHATE 20 MG/5ML IJ SOLN
INTRAMUSCULAR | Status: AC
  Filled 2014-03-16: qty 5

## 2014-03-16 MED ORDER — PALONOSETRON HCL INJECTION 0.25 MG/5ML
INTRAVENOUS | Status: AC
  Filled 2014-03-16: qty 5

## 2014-03-16 NOTE — Progress Notes (Signed)
[facility]  Telephone:(336) [phone] Fax:(336) [phone]     ID: [name] OB: June 19, 1956  MR#: [id number]  KDX#:[id number]  PCP: Chesley Noon, MD GYN:   SU: Rolm Bookbinder OTHER MD:  CHIEF COMPLAINT: Triple negative breast cancer  CURRENT TREATMENT: Adjuvant chemotherapy  BREAST CANCER HISTORY: From the original intake note 01/18/2014:  The patient had routine bilateral screening mammography at the breast Center for 10/28/2013 showing a possible mass in the right breast. Additional views and right breast ultrasonography 12/20/2013 confirmed an irregular 1.3 cm right breast mass at the 6:00 position. This was not palpable. Ultrasound showed an irregular hypoechoic mass in the right breast measuring 1.4 cm. There was no right axillary adenopathy identified.  On 12/29/2013 the patient underwent right breast biopsy, showing ([id number]) and invasive ductal carcinoma, grade 3, estrogen and progesterone receptor negative, HER-2 not amplified with a signals ratio of 1.35 and a copy number per cell of 2.10, and an MIB-1 of 80%.  On 01/12/2014 the patient underwent bilateral breast MRI. This showed the mass in the right breast lower outer quadrant to measure 2.2 cm maximally. There was a 2 cm well circumscribed mass in the medial right breast which has been stable since 2011 him a and a 1 cm oval mass in the upper midline of the right breast, likely an intramammary lymph node, with benign features. A right axillary lymph node with cortical thickening measured 0.9 cm. The fatty hilum was possibly effaced; note Right axilla had been benign by [place] before biopsy. In the left breast there were no findings of concern.  The patient's subsequent history is as detailed below.  INTERVAL HISTORY: [name] is here for cycle 2 of Doxorubicin and Cyclophosphamide.  She receives this treatment on day 1 of a 14 day cycle with Neulasta given on day 2 for granulocyte support.    She is  doing very well today.  She denies fevers, chills, nausea, vomiting, constipation, diarrhea, numbness/tingling, pain, or any further concerns.    REVIEW OF SYSTEMS: A detailed 10 point review of systems is been assessed and pertinent symptoms as mentioned in interval history    PAST MEDICAL HISTORY: Past Medical History  Diagnosis Date  . HTN (hypertension)   . Diabetes mellitus   . High cholesterol   . Breast cancer   . Family history of malignant neoplasm of breast   . Glaucoma   . Wears glasses   . Wears dentures     full top-partial bottom    PAST SURGICAL HISTORY: Past Surgical History  Procedure Laterality Date  . Transphenoidal pituitary resection  2006  . Abdominal hysterectomy  1993  . Breast biopsy  2000    LEFT  . Tonsillectomy    . Portacath placement Left 02/08/2014    Procedure: INSERTION PORT-A-CATH;  Surgeon: Rolm Bookbinder, MD;  Location: [facility];  Service: General;  Laterality: Left;    FAMILY HISTORY Family History  Problem Relation Age of Onset  . Hypertension Mother   . Diabetes type II Mother   . Breast cancer Mother     dx ~37; deceased 53  . Hypertension Father   . Breast cancer Maternal Aunt     deceased 51  . Cancer Maternal Uncle     unk. type; deceased 42s  . Cancer Maternal Uncle     unk. type; deceased late 59s  . Uterine cancer Cousin 32    daughter of an unaffected mat aunt who is 30  the patient's father is in his early 95s. The patient's mother died at the age of 81, from complications of breast cancer which had been diagnosed "years before". The patient had 2 brothers, 2 sisters. The only other breast cancer in the family was a maternal aunt who died from breast cancer at the age of 21. (This means that 2 of 3 sisters, including the patient's mother, had breast cancer, one of them before the age of 53). There is no history of ovarian cancer in the family.  GYNECOLOGIC HISTORY:  Menarche age 40, first live birth  age 64. The patient is GX P1. She underwent total abdominal hysterectomy with bilateral salpingo-oophorectomy at age 52. She took hormone replacement for one or 2 months old.  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 The patient is a widow. She lives by herself, with no pets. Her daughter [name] lives in [place], where she works in a Geneticist, molecular. The patient has no grandchildren. She is a Psychologist, forensic    ADVANCED DIRECTIVES: Not in place; advanced directives were discussed with the patient at her initial visit 01/18/2014. She intends to name her daughter as healthcare power of attorney. [name] can be reached at [phone]   HEALTH MAINTENANCE: History  Substance Use Topics  . Smoking status: Former Smoker    Types: Cigarettes    Quit date: 01/31/1994  . Smokeless tobacco: Not on file  . Alcohol Use: Yes     Comment: occ     Colonoscopy:  PAP: Status post hysterectomy  Bone density: Remote  Lipid panel:  No Known Allergies  Current Outpatient Prescriptions  Medication Sig Dispense Refill  . amLODipine (NORVASC) 10 MG tablet TAKE ONE TABLET BY MOUTH ONCE DAILY      . Atorvastatin Calcium (LIPITOR PO) Take 40 mg by mouth daily.       Marland Kitchen dexamethasone (DECADRON) 0.1 % ophthalmic solution Place 1 drop into both eyes daily.      Marland Kitchen glyBURIDE (DIABETA) 5 MG tablet Take 5 mg by mouth daily with breakfast.       . latanoprost (XALATAN) 0.005 % ophthalmic solution Place 1 drop into both eyes at bedtime.      . lidocaine-prilocaine (EMLA) cream Apply 1 application topically as needed. Apply over port site 1-2 hours before chemo, then cover with plastic wrap  30 g  0  . lisinopril-hydrochlorothiazide (PRINZIDE,ZESTORETIC) 20-12.5 MG per tablet Take 2 tablets by  mouth daily.       . metFORMIN (GLUCOPHAGE-XR) 500 MG 24 hr tablet Take 500 mg by mouth 2 (two) times daily.       Marland Kitchen tiZANidine (ZANAFLEX) 4 MG tablet 4 mg at bedtime. Usually 1/2 tab qhs      . ciprofloxacin (CIPRO) 250 MG tablet Take 1 tablet (250 mg total) by mouth 2 (two) times daily.  14 tablet  0  . LORazepam (ATIVAN) 0.5 MG tablet Take 1 tablet (0.5 mg total) by mouth at bedtime as needed (Nausea or vomiting).  30 tablet  0  . ondansetron (ZOFRAN) 8 MG tablet Take 1 tablet (8 mg total) by mouth every 8 (eight) hours as needed for nausea or vomiting.  60 tablet  2  . oxyCODONE-acetaminophen (PERCOCET) 10-325 MG per tablet Take 1 tablet by mouth every  6 (six) hours as needed for pain.  20 tablet  0  . prochlorperazine (COMPAZINE) 10 MG tablet Take 1 tablet (10 mg total) by mouth every 6 (six) hours as needed (Nausea or vomiting).  30 tablet  1   No current facility-administered medications for this visit.    OBJECTIVE: Middle-aged [place] American woman who appears stated age. She is obese not in acute distress  Filed Vitals:   03/16/14 1358  BP: 156/90  Pulse: 109  Temp: 98.4 F (36.9 C)  Resp: 18     Body mass index is 41.74 kg/(m^2).    ECOG FS:1 - Symptomatic but completely ambulatory  HEENT PERRLA, sclerae anicteric, conjunctiva no blood, neck supple no JVD no thyromegaly, head atraumatic normocephalic Ear-nose-throat: Oropharynx clear and moist Lymphatic: No cervical or supraclavicular adenopathy Lungs no rales or rhonchi Heart regular rate and rhythm Abd soft, obese, nontender, positive bowel sounds MSK no focal spinal tenderness, no upper extremity edema  Neuro: non-focal, well-oriented,  appropriate  affect Breasts:  The right breast is status post lumpectomy,  in good healing stage. The left breast is unremarkable. No bilateral axillary lymphadenopathy noted Skin: intact, no skin lesions noted  LAB RESULTS:  CMP     Component Value Date/Time   NA 139 03/16/2014  1342   NA 141 02/03/2014 1400    I No results found for this basename: SPEP,  UPEP,   kappa and lambda light chains    Lab Results  Component Value Date   WBC 10.2 03/16/2014   NEUTROABS 7.5* 03/16/2014   HGB 10.4* 03/16/2014   HCT 33.1* 03/16/2014   MCV 83.2 03/16/2014   PLT 223 03/16/2014      Chemistry      Component Value Date/Time   NA 139 03/16/2014 1342   NA 141 02/03/2014 1400      Component Value Date/Time   CALCIUM 9.7 03/16/2014 1342   CALCIUM 10.0 02/03/2014 1400       Lab Results  Component Value Date   LABCA2 51* 02/03/2014    No components found with this basename: [id number]    No results found for this basename: INR,  in the last 168 hours  Urinalysis No results found for this basename: colorurine,  appearanceur,  labspec,  phurine,  glucoseu,  hgbur,  bilirubinur,  ketonesur,  proteinur,  urobilinogen,  nitrite,  leukocytesur    STUDIES: Nm Sentinel Node Inj-no Rpt (breast)  02/08/2014   CLINICAL DATA: right axillary sentinel node biopsy   Sulfur colloid was injected intradermally by the nuclear medicine  technologist for breast cancer sentinel node localization.    Mm Breast Surgical Specimen  02/08/2014   CLINICAL DATA:  Right lumpectomy for invasive mammary carcinoma following radioactive seed localization.  EXAM: SPECIMEN RADIOGRAPH OF THE RIGHT BREAST  COMPARISON:  Previous exam(s)  FINDINGS: Status post excision of the right breast. The radioactive seed, mass and biopsy marker clip are present and are marked for pathology.  IMPRESSION: Specimen radiograph of the right breast.   Electronically Signed   By: Enrique Sack M.D.   On: 02/08/2014 14:54   Dg Chest Port 1 View  02/08/2014   CLINICAL DATA:  Breast carcinoma and status post Port-A-Cath placement.  EXAM: PORTABLE CHEST - 1 VIEW  COMPARISON:  09/02/2005  FINDINGS: Left subclavian Port-A-Cath has been placed with the catheter tip in the upper SVC. No pneumothorax. The heart is mildly enlarged. No pleural fluid.  Bibasilar atelectasis present.  IMPRESSION: No pneumothorax or other  acute findings after Port-A-Cath placement. The catheter tip is in the upper SVC.   Electronically Signed   By: Aletta Edouard M.D.   On: 02/08/2014 16:23   Dg Fluoro Guide Cv Line-no Report  02/08/2014   CLINICAL DATA: Port Insertion [facility] OR 8   FLOURO GUIDE CV LINE  Fluoroscopy was utilized by the requesting physician.  No radiographic  interpretation.    Mm Rt Plc Breast Loc Dev   1st Lesion  Inc Mammo Guide  02/02/2014   CLINICAL DATA:  Patient with recent diagnosis right breast invasive carcinoma.  EXAM: MAMMOGRAPHIC GUIDED RADIOACTIVE SEED LOCALIZATION OF THE RIGHT BREAST  COMPARISON:  Previous exam(s)  FINDINGS: Patient presents for radioactive seed localization prior to right breast lumpectomy. I met with the patient and we discussed the procedure of seed localization including benefits and alternatives. We discussed the high likelihood of a successful procedure. We discussed the risks of the procedure including infection, bleeding, tissue injury and further surgery. We discussed the low dose of radioactivity involved in the procedure. Informed, written consent was given.  The usual time-out protocol was performed immediately prior to the procedure.  Using mammographic guidance, sterile technique, 2% lidocaine and an I-125 radioactive seed, biopsy marking clip in right breast mass was localized using a lateral approach. The follow-up mammogram images confirm the seed in the expected location and are marked for Dr. Donne Hazel.  Follow-up survey of the patient confirms presents of radioactive seed, biopsy marking clip and mass.  Order number of I-125 seed:  [id number].  Dose of I-125 seed:  0.250 mCi.  The patient tolerated the procedure well and was released from the [facility]. She was given instructions regarding seed removal.  IMPRESSION: Radioactive seed localization right breast. No apparent complications.   Electronically Signed    By: Lovey Newcomer M.D.   On: 02/02/2014 15:11   ASSESSMENT: 58 y.o. BRCA negative [place] woman status post right breast biopsy 12/29/2013 for a clinical T2 N1, stage IIB invasive ductal carcinoma, grade 3, triple negative, with an MIB-1 of 80%.  (1) status post right lumpectomy and sentinel lymph node sampling 02/08/2014 for a pT2 pN0, stage IIA invasive ductal carcinoma, grade 3, repeat prognostic panel again triple negative  (2) to start adjuvant chemotherapy 03/02/2014, initially doxorubicin and cyclophosphamide in dose dense fashion x4 with Neulasta day 2, to be followed by carboplatin and paclitaxel weekly x12  (3) adjuvant radiation to follow chemotherapy  (4) status post remote TAH BSO   (5) Status post removal of a pituitary adenoma 09/05/2005, no malignancy identified  (6) Uncontrolled hypertension  (7) Anemia  PLAN:  Laiana is doing well today.  Her labs are stable, she does have a blood glucose of 288.  I reviewed these labs with her.  She has no way to check her blood sugar following treatment.  She is tolerating treatment well and will proceed with cycle 2 day 1 of Doxorubicin and Cyclophosphamide today.    She was recommended to take Compazine PRN for nausea, and to avoid Dexamethasone orally.  She will keep a record of her blood sugars once getting her log and will bring them to her next appointment.    [name] will return tomorrow for Neulasta and in one week for labs and evaluation.   She knows to call us in the interim for any questions or concerns.  We can certainly see her sooner if needed.  I spent 25 minutes counseling the patient face to face.  The total  time spent in the appointment was 30 minutes.  Minette Headland, [place] [phone] 03/18/2014 9:12 AM

## 2014-03-16 NOTE — Patient Instructions (Signed)
[facility] Discharge Instructions for Patients Receiving Chemotherapy  Today you received the following chemotherapy agents   adriamycin & Cytoxan  To help prevent nausea and vomiting after your treatment, we encourage you to take your nausea medication.  If you develop nausea and vomiting that is not controlled by your nausea medication, call the clinic.   BELOW ARE SYMPTOMS THAT SHOULD BE REPORTED IMMEDIATELY:  *FEVER GREATER THAN 100.5 F  *CHILLS WITH OR WITHOUT FEVER  NAUSEA AND VOMITING THAT IS NOT CONTROLLED WITH YOUR NAUSEA MEDICATION  *UNUSUAL SHORTNESS OF BREATH  *UNUSUAL BRUISING OR BLEEDING  TENDERNESS IN MOUTH AND THROAT WITH OR WITHOUT PRESENCE OF ULCERS  *URINARY PROBLEMS  *BOWEL PROBLEMS  UNUSUAL RASH Items with * indicate a potential emergency and should be followed up as soon as possible.  Feel free to call the clinic you have any questions or concerns. The clinic phone number is [phone].

## 2014-03-16 NOTE — Progress Notes (Signed)
Called pt's PCP to get a Rx for a glucose monitor and strips so pt can test blood glucose on a regular basis since she is receiving dexamethasone with chemotherapy treatments. Today's blood sugar with labs were 288. Pt's PCP sent Rx for monitor and strips to pt's pharmacy.

## 2014-03-17 ENCOUNTER — Ambulatory Visit (HOSPITAL_BASED_OUTPATIENT_CLINIC_OR_DEPARTMENT_OTHER): Payer: BC Managed Care – PPO

## 2014-03-17 VITALS — BP 174/84 | HR 93 | Temp 98.3°F

## 2014-03-17 DIAGNOSIS — C50511 Malignant neoplasm of lower-outer quadrant of right female breast: Secondary | ICD-10-CM

## 2014-03-17 DIAGNOSIS — Z5189 Encounter for other specified aftercare: Secondary | ICD-10-CM

## 2014-03-17 DIAGNOSIS — C50919 Malignant neoplasm of unspecified site of unspecified female breast: Secondary | ICD-10-CM

## 2014-03-17 MED ORDER — PEGFILGRASTIM INJECTION 6 MG/0.6ML
6.0000 mg | Freq: Once | SUBCUTANEOUS | Status: AC
  Administered 2014-03-17: 6 mg via SUBCUTANEOUS
  Filled 2014-03-17: qty 0.6

## 2014-03-23 ENCOUNTER — Other Ambulatory Visit (HOSPITAL_BASED_OUTPATIENT_CLINIC_OR_DEPARTMENT_OTHER): Payer: BC Managed Care – PPO

## 2014-03-23 ENCOUNTER — Ambulatory Visit (HOSPITAL_BASED_OUTPATIENT_CLINIC_OR_DEPARTMENT_OTHER): Payer: BC Managed Care – PPO | Admitting: Adult Health

## 2014-03-23 VITALS — BP 142/80 | HR 101 | Temp 98.7°F | Resp 20 | Ht 65.0 in | Wt 250.5 lb

## 2014-03-23 DIAGNOSIS — C50919 Malignant neoplasm of unspecified site of unspecified female breast: Secondary | ICD-10-CM

## 2014-03-23 DIAGNOSIS — D649 Anemia, unspecified: Secondary | ICD-10-CM

## 2014-03-23 DIAGNOSIS — C50511 Malignant neoplasm of lower-outer quadrant of right female breast: Secondary | ICD-10-CM

## 2014-03-23 DIAGNOSIS — K089 Disorder of teeth and supporting structures, unspecified: Secondary | ICD-10-CM

## 2014-03-23 DIAGNOSIS — D702 Other drug-induced agranulocytosis: Secondary | ICD-10-CM

## 2014-03-23 DIAGNOSIS — Z171 Estrogen receptor negative status [ER-]: Secondary | ICD-10-CM

## 2014-03-23 DIAGNOSIS — I10 Essential (primary) hypertension: Secondary | ICD-10-CM

## 2014-03-23 LAB — COMPREHENSIVE METABOLIC PANEL (CC13)
ALK PHOS: 157 U/L — AB (ref 40–150)
ALT: 17 U/L (ref 0–55)
AST: 11 U/L (ref 5–34)
Albumin: 3.7 g/dL (ref 3.5–5.0)
Anion Gap: 10 mEq/L (ref 3–11)
BILIRUBIN TOTAL: 0.26 mg/dL (ref 0.20–1.20)
BUN: 37.8 mg/dL — AB (ref 7.0–26.0)
CALCIUM: 9.7 mg/dL (ref 8.4–10.4)
CO2: 20 mEq/L — ABNORMAL LOW (ref 22–29)
CREATININE: 1.4 mg/dL — AB (ref 0.6–1.1)
Chloride: 103 mEq/L (ref 98–109)
Glucose: 284 mg/dl — ABNORMAL HIGH (ref 70–140)
Potassium: 4.7 mEq/L (ref 3.5–5.1)
Sodium: 132 mEq/L — ABNORMAL LOW (ref 136–145)
Total Protein: 7.3 g/dL (ref 6.4–8.3)

## 2014-03-23 LAB — CBC WITH DIFFERENTIAL/PLATELET
BASO%: 2.5 % — ABNORMAL HIGH (ref 0.0–2.0)
Basophils Absolute: 0 10*3/uL (ref 0.0–0.1)
EOS%: 2.5 % (ref 0.0–7.0)
Eosinophils Absolute: 0 10*3/uL (ref 0.0–0.5)
HCT: 29.7 % — ABNORMAL LOW (ref 34.8–46.6)
HGB: 9.6 g/dL — ABNORMAL LOW (ref 11.6–15.9)
LYMPH%: 68.4 % — AB (ref 14.0–49.7)
MCH: 25.7 pg (ref 25.1–34.0)
MCHC: 32.3 g/dL (ref 31.5–36.0)
MCV: 79.6 fL (ref 79.5–101.0)
MONO#: 0.1 10*3/uL (ref 0.1–0.9)
MONO%: 10.1 % (ref 0.0–14.0)
NEUT#: 0.1 10*3/uL — CL (ref 1.5–6.5)
NEUT%: 16.5 % — AB (ref 38.4–76.8)
Platelets: 240 10*3/uL (ref 145–400)
RBC: 3.73 10*6/uL (ref 3.70–5.45)
RDW: 15.7 % — ABNORMAL HIGH (ref 11.2–14.5)
WBC: 0.8 10*3/uL — AB (ref 3.9–10.3)
lymph#: 0.5 10*3/uL — ABNORMAL LOW (ref 0.9–3.3)
nRBC: 0 % (ref 0–0)

## 2014-03-23 MED ORDER — CIPROFLOXACIN HCL 250 MG PO TABS: 250.0000 mg | ORAL_TABLET | Freq: Two times a day (BID) | ORAL | Status: DC

## 2014-03-23 NOTE — Progress Notes (Signed)
[place]  [phone] [phone]     ID: [name] OB: [birth year]  MR#: [id number]  VEH#:[id number]  PCP: Chesley Noon, MD GYN:   SU: Rolm Bookbinder OTHER MD:  CHIEF COMPLAINT: Triple negative breast cancer  CURRENT TREATMENT: Adjuvant chemotherapy  BREAST CANCER HISTORY: From the original intake note 01/18/2014:  The patient had routine bilateral screening mammography at the breast Center for 10/28/2013 showing a possible mass in the right breast. Additional views and right breast ultrasonography 12/20/2013 confirmed an irregular 1.3 cm right breast mass at the 6:00 position. This was not palpable. Ultrasound showed an irregular hypoechoic mass in the right breast measuring 1.4 cm. There was no right axillary adenopathy identified.  On 12/29/2013 the patient underwent right breast biopsy, showing ([id number]) and invasive ductal carcinoma, grade 3, estrogen and progesterone receptor negative, HER-2 not amplified with a signals ratio of 1.35 and a copy number per cell of 2.10, and an MIB-1 of 80%.  On 01/12/2014 the patient underwent bilateral breast MRI. This showed the mass in the right breast lower outer quadrant to measure 2.2 cm maximally. There was a 2 cm well circumscribed mass in the medial right breast which has been stable since 2011 him a and a 1 cm oval mass in the upper midline of the right breast, likely an intramammary lymph node, with benign features. A right axillary lymph node with cortical thickening measured 0.9 cm. The fatty hilum was possibly effaced; note Right axilla had been benign by [place] before biopsy. In the left breast there were no findings of concern.  The patient's subsequent history is as detailed below.  INTERVAL HISTORY: [name] is here for evaluation after receiving her second cycle of adjuvant Doxorubicin and cyclophosphamide.  She receives this treatment on day 1 of a 14 day cycle with Neulasta given  on day 2 for granulocyte support.    [name] is doing well today.  She is neutropenic, but tolerated her treatment well.  She was slightly more fatigued with the second cycle than with the first and is starting to slowly improve.  She is starting to notice hyperpigmentation of her nail bed and that her skin is drier than usual.  She does have some tooth pain and is going to her dentist to be evaluate, she denies any jaw swelling, or drainage from the area.  She denies fevers, chills, nasuea, vomiting, constipation, diarrhea, numbness/tingling, or any further concerns.    REVIEW OF SYSTEMS: A detailed 10 point review of systems is been assessed and pertinent symptoms as mentioned in interval history    PAST MEDICAL HISTORY: Past Medical History  Diagnosis Date  . HTN (hypertension)   . Diabetes mellitus   . High cholesterol   . Breast cancer   . Family history of malignant neoplasm of breast   . Glaucoma   . Wears glasses   . Wears dentures     full top-partial bottom    PAST SURGICAL HISTORY: Past Surgical History  Procedure Laterality Date  . Transphenoidal pituitary resection  2006  . Abdominal hysterectomy  1993  . Breast biopsy  2000    LEFT  . Tonsillectomy    . Portacath placement Left 02/08/2014    Procedure: INSERTION PORT-A-CATH;  Surgeon: Rolm Bookbinder, MD;  Location: [facility];  Service: General;  Laterality: Left;    FAMILY HISTORY Family History  Problem Relation Age of Onset  . Hypertension Mother   . Diabetes type II Mother   .  Breast cancer Mother     dx ~82; deceased 106  . Hypertension Father   . Breast cancer Maternal Aunt     deceased 52  . Cancer Maternal Uncle     unk. type; deceased 39s  . Cancer Maternal Uncle     unk. type; deceased late 76s  . Uterine cancer Cousin 65    daughter of an unaffected mat aunt who is 65   the patient's father is in his early 1s. The patient's mother died at the age of 39, from complications of  breast cancer which had been diagnosed "years before". The patient had 2 brothers, 2 sisters. The only other breast cancer in the family was a maternal aunt who died from breast cancer at the age of 37. (This means that 2 of 3 sisters, including the patient's mother, had breast cancer, one of them before the age of 93). There is no history of ovarian cancer in the family.  GYNECOLOGIC HISTORY:  Menarche age 58, first live birth age 58. The patient is GX P1. She underwent total abdominal hysterectomy with bilateral salpingo-oophorectomy at age 58. She took hormone replacement for one or 2 months old.  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 The patient is a widow. She lives by herself, with no pets. Her daughter [name] lives in [place], where she works in a Geneticist, molecular. The patient has no grandchildren. She is a Psychologist, forensic    ADVANCED DIRECTIVES: Not in place; advanced directives were discussed with the patient at her initial visit 01/18/2014. She intends to name her daughter as healthcare power of attorney. [name] can be reached at [phone]   HEALTH MAINTENANCE: History  Substance Use Topics  . Smoking status: Former Smoker    Types: Cigarettes    Quit date: 01/31/1994  . Smokeless tobacco: Not on file  . Alcohol Use: Yes     Comment: occ     Colonoscopy:  PAP: Status post hysterectomy  Bone density: Remote  Lipid panel:  No Known Allergies  Current Outpatient Prescriptions  Medication Sig Dispense Refill  . amLODipine (NORVASC) 10 MG tablet TAKE ONE TABLET BY MOUTH ONCE DAILY      . Atorvastatin Calcium (LIPITOR PO) Take 40 mg by mouth daily.       Marland Kitchen dexamethasone (DECADRON) 0.1 % ophthalmic solution Place 1 drop into both eyes daily.      Marland Kitchen  glyBURIDE (DIABETA) 5 MG tablet Take 5 mg by mouth daily with breakfast.       . latanoprost (XALATAN) 0.005 % ophthalmic solution Place 1 drop into both eyes at bedtime.      Marland Kitchen lisinopril-hydrochlorothiazide (PRINZIDE,ZESTORETIC) 20-12.5 MG per tablet Take 2 tablets by mouth daily.       . metFORMIN (GLUCOPHAGE-XR) 500 MG 24 hr tablet Take 500 mg by mouth 2 (two) times daily.       . ondansetron (ZOFRAN) 8 MG tablet Take 1 tablet (8 mg total) by mouth every 8 (eight) hours as needed for nausea or vomiting.  60 tablet  2  . tiZANidine (ZANAFLEX) 4 MG tablet 4 mg at bedtime.       . ciprofloxacin (CIPRO) 250 MG tablet Take 1 tablet (250 mg total) by mouth 2 (two) times daily.  14 tablet  0  . lidocaine-prilocaine (EMLA) cream Apply 1 application topically as needed. Apply over port site 1-2 hours before chemo, then cover with plastic wrap  30 g  0  . LORazepam (ATIVAN) 0.5 MG tablet Take 1 tablet (0.5 mg total) by mouth at bedtime as needed (Nausea or vomiting).  30 tablet  0  . oxyCODONE-acetaminophen (PERCOCET) 10-325 MG per tablet Take 1 tablet by mouth every 6 (six) hours as needed for pain.  20 tablet  0  . prochlorperazine (COMPAZINE) 10 MG tablet Take 1 tablet (10 mg total) by mouth every 6 (six) hours as needed (Nausea or vomiting).  30 tablet  1   No current facility-administered medications for this visit.    OBJECTIVE: Middle-aged [place] American woman who appears stated age. She is obese not in acute distress  Filed Vitals:   03/23/14 1411  BP: 142/80  Pulse:   Temp:   Resp:      Body mass index is 41.69 kg/(m^2).    ECOG FS:1 - Symptomatic but completely ambulatory  GENERAL: Patient is a well appearing female in no acute distress HEENT:  Sclerae anicteric.  Oropharynx clear and moist. No ulcerations or evidence of oropharyngeal candidiasis. Neck is supple.  NODES:  No cervical, supraclavicular, or axillary lymphadenopathy palpated.  BREAST EXAM:  Deferred. LUNGS:  Clear to  auscultation bilaterally.  No wheezes or rhonchi. HEART:  Regular rate and rhythm. No murmur appreciated. ABDOMEN:  Soft, nontender.  Positive, normoactive bowel sounds. No organomegaly palpated. MSK:  No focal spinal tenderness to palpation. Full range of motion bilaterally in the upper extremities. EXTREMITIES:  No peripheral edema.   SKIN:  Skin is slightly dry, at the base of her fingernails she does have some hyperpigmentation, but the nails do not appear brittle NEURO:  Nonfocal. Well oriented.  Appropriate affect.    LAB RESULTS:  CMP     Component Value Date/Time   NA 132* 03/23/2014 1318   NA 141 02/03/2014 1400    I No results found for this basename: SPEP,  UPEP,   kappa and lambda light chains    Lab Results  Component Value Date   WBC 0.8* 03/23/2014   NEUTROABS 0.1* 03/23/2014   HGB 9.6* 03/23/2014   HCT 29.7* 03/23/2014   MCV 79.6 03/23/2014   PLT 240 03/23/2014      Chemistry      Component Value Date/Time   NA 132* 03/23/2014 1318   NA 141 02/03/2014 1400      Component Value Date/Time   CALCIUM 9.7 03/23/2014 1318   CALCIUM 10.0 02/03/2014 1400       Lab Results  Component Value Date   LABCA2 51* 02/03/2014    No components found with this basename: [id number]    No results found for this basename: INR,  in the last 168 hours  Urinalysis No results found for this basename: colorurine,  appearanceur,  labspec,  phurine,  glucoseu,  hgbur,  bilirubinur,  ketonesur,  proteinur,  urobilinogen,  nitrite,  leukocytesur    STUDIES: Nm Sentinel Node Inj-no Rpt (breast)  02/08/2014   CLINICAL DATA: right axillary sentinel node biopsy   Sulfur colloid was injected intradermally by the nuclear medicine  technologist for breast cancer sentinel node localization.    Mm Breast Surgical Specimen  02/08/2014   CLINICAL DATA:  Right lumpectomy for invasive mammary carcinoma following radioactive seed localization.  EXAM: SPECIMEN RADIOGRAPH OF THE RIGHT BREAST   COMPARISON:  Previous exam(s)  FINDINGS: Status  post excision of the right breast. The radioactive seed, mass and biopsy marker clip are present and are marked for pathology.  IMPRESSION: Specimen radiograph of the right breast.   Electronically Signed   By: Enrique Sack M.D.   On: 02/08/2014 14:54   Dg Chest Port 1 View  02/08/2014   CLINICAL DATA:  Breast carcinoma and status post Port-A-Cath placement.  EXAM: PORTABLE CHEST - 1 VIEW  COMPARISON:  09/02/2005  FINDINGS: Left subclavian Port-A-Cath has been placed with the catheter tip in the upper SVC. No pneumothorax. The heart is mildly enlarged. No pleural fluid. Bibasilar atelectasis present.  IMPRESSION: No pneumothorax or other acute findings after Port-A-Cath placement. The catheter tip is in the upper SVC.   Electronically Signed   By: Aletta Edouard M.D.   On: 02/08/2014 16:23   Dg Fluoro Guide Cv Line-no Report  02/08/2014   CLINICAL DATA: Port Insertion [facility] OR 8   FLOURO GUIDE CV LINE  Fluoroscopy was utilized by the requesting physician.  No radiographic  interpretation.    Mm Rt Plc Breast Loc Dev   1st Lesion  Inc Mammo Guide  02/02/2014   CLINICAL DATA:  Patient with recent diagnosis right breast invasive carcinoma.  EXAM: MAMMOGRAPHIC GUIDED RADIOACTIVE SEED LOCALIZATION OF THE RIGHT BREAST  COMPARISON:  Previous exam(s)  FINDINGS: Patient presents for radioactive seed localization prior to right breast lumpectomy. I met with the patient and we discussed the procedure of seed localization including benefits and alternatives. We discussed the high likelihood of a successful procedure. We discussed the risks of the procedure including infection, bleeding, tissue injury and further surgery. We discussed the low dose of radioactivity involved in the procedure. Informed, written consent was given.  The usual time-out protocol was performed immediately prior to the procedure.  Using mammographic guidance, sterile technique, 2% lidocaine and an  I-125 radioactive seed, biopsy marking clip in right breast mass was localized using a lateral approach. The follow-up mammogram images confirm the seed in the expected location and are marked for Dr. Donne Hazel.  Follow-up survey of the patient confirms presents of radioactive seed, biopsy marking clip and mass.  Order number of I-125 seed:  [id number].  Dose of I-125 seed:  0.250 mCi.  The patient tolerated the procedure well and was released from the [facility]. She was given instructions regarding seed removal.  IMPRESSION: Radioactive seed localization right breast. No apparent complications.   Electronically Signed   By: Lovey Newcomer M.D.   On: 02/02/2014 15:11   ASSESSMENT: 58 y.o. BRCA negative [place] woman status post right breast biopsy 12/29/2013 for a clinical T2 N1, stage IIB invasive ductal carcinoma, grade 3, triple negative, with an MIB-1 of 80%.  (1) status post right lumpectomy and sentinel lymph node sampling 02/08/2014 for a pT2 pN0, stage IIA invasive ductal carcinoma, grade 3, repeat prognostic panel again triple negative  (2) to start adjuvant chemotherapy 03/02/2014, initially doxorubicin and cyclophosphamide in dose dense fashion x4 with Neulasta day 2, to be followed by carboplatin and paclitaxel weekly x12  (3) adjuvant radiation to follow chemotherapy  (4) status post remote TAH BSO   (5) Status post removal of a pituitary adenoma 09/05/2005, no malignancy identified  (6) Uncontrolled hypertension  (7) Anemia  PLAN:  [name] is doing well today.  She tolerated Doxorubicin and cylophosphamide  Relatively well.  She is subsequently neutropenic from treatment.  She will start taking Cipro 229m po bid and I reviewed neutroopenic instructions with her in  detail.    I reviewed her blood glucose with her and it is 284.  We reviewed her hyperglycemia in detail.  She does have a glucometer however has been unable to get it to work.  I requested she get this fixed or call  her PCP so we can have a record of her at home blood glucoses.  She only receives Dexamethasone on day 1, however this may need to be slightly altered. I reviewed the ADA diet with her along with recommended exercising when possible.  She may need one of her diabetic medications increased.    In regards to her tooth, she will be evaluated by her dentist.  Should they indicate she has any abscess I instructed her to call our office, so that I could prescribe Augmentin.    For dry skin I recommended she use Cetaphil lotion, and for her nail hyperpigmentation I recommended she apply tea tree oil daily.     Johna will return in one week for labs, evaluation, and cycle 3 of doxorubicin and Cylophosphamide.  She knows to call us in the interim for any questions or concerns.  We can certainly see her sooner if needed.  I spent 25 minutes counseling the patient face to face.  The total time spent in the appointment was 30 minutes.  Minette Headland, [place] [phone] 03/23/2014 2:27 PM

## 2014-03-23 NOTE — Patient Instructions (Signed)
Patient Neutropenia Instruction Sheet  Diagnosis: Breast Cancer      Treating Physician: Dr. Jana Hakim  Treatment: 1. Type of chemotherapy: Doxorubicin and Cyclophosphamide 2. Date of last treatment: 03/16/14  Last Blood Counts: Lab Results  Component Value Date   WBC 0.8* 03/23/2014   HGB 9.6* 03/23/2014   HCT 29.7* 03/23/2014   MCV 79.6 03/23/2014   PLT 240 03/23/2014   ANC 100     Prophylactic Antibiotics: Cipro 250 mg by mouth twice a day Instructions: 1. Monitor temperature and call if fever  greater than 100.5, chills, shaking chills (rigors) 2. Call Physician on-call at [phone] 3. Give him/her symptoms and list of medications that you are taking and your last blood count.  Apply Cetaphil lotion to your skin daily.  Also, tea tree oil to your fingernails and toenails.    Diets for Diabetes, Food Labeling Look at food labels to help you decide how much of a product you can eat. You will want to check the amount of total carbohydrate in a serving to see how the food fits into your meal plan. In the list of ingredients, the ingredient present in the largest amount by weight must be listed first, followed by the other ingredients in descending order. STANDARD OF IDENTITY Most products have a list of ingredients. However, foods that the Food and Drug Administration (FDA) has given a standard of identity do not need a list of ingredients. A standard of identity means that a food must contain certain ingredients if it is called a particular name. Examples are mayonnaise, peanut butter, ketchup, jelly, and cheese. LABELING TERMS There are many terms found on food labels. Some of these terms have specific definitions. Some terms are regulated by the FDA, and the FDA has clearly specified how they can be used. Others are not regulated or well-defined and can be misleading and confusing. SPECIFICALLY DEFINED TERMS Nutritive Sweetener.  A sweetener that contains calories,such as table  sugar or honey. Nonnutritive Sweetener.  A sweetener with few or no calories,such as saccharin, aspartame, sucralose, and cyclamate. LABELING TERMS REGULATED BY THE FDA Free.  The product contains only a tiny or small amount of fat, cholesterol, sodium, sugar, or calories. For example, a "fat-free" product will contain less than 0.5 g of fat per serving. Low.  A food described as "low" in fat, saturated fat, cholesterol, sodium, or calories could be eaten fairly often without exceeding dietary guidelines. For example, "low in fat" means no more than 3 g of fat per serving. Lean.  "Lean" and "extra lean" are U.S. Department of Agriculture Scientist, research (physical sciences)) terms for use on meat and poultry products. "Lean" means the product contains less than 10 g of fat, 4 g of saturated fat, and 95 mg of cholesterol per serving. "Lean" is not as low in fat as a product labeled "low." Extra Lean.  "Extra lean" means the product contains less than 5 g of fat, 2 g of saturated fat, and 95 mg of cholesterol per serving. While "extra lean" has less fat than "lean," it is still higher in fat than a product labeled "low." Reduced, Less, Fewer.  A diet product that contains 25% less of a nutrient or calories than the regular version. For example, hot dogs might be labeled "25% less fat than our regular hot dogs." Light/Lite.  A diet product that contains  fewer calories or  the fat of the original. For example, "light in sodium" means a product with  the usual sodium. More.  One serving contains at least 10% more of the daily value of a vitamin, mineral, or fiber than usual. Good Source Of.  One serving contains 10% to 19% of the daily value for a particular vitamin, mineral, or fiber. Excellent Source Of.  One serving contains 20% or more of the daily value for a particular nutrient. Other terms used might be "high in" or "rich in." Enriched or Fortified.  The product contains added vitamins, minerals, or protein.  Nutrition labeling must be used on enriched or fortified foods. Imitation.  The product has been altered so that it is lower in protein, vitamins, or minerals than the usual food,such as imitation peanut butter. Total Fat.  The number listed is the total of all fat found in a serving of the product. Under total fat, food labels must list saturated fat and trans fat, which are associated with raising bad cholesterol and an increased risk of heart blood vessel disease. Saturated Fat.  Mainly fats from animal-based sources. Some examples are red meat, cheese, cream, whole milk, and coconut oil. Trans Fat.  Found in some fried snack foods, packaged foods, and fried restaurant foods. It is recommended you eat as close to 0 g of trans fat as possible, since it raises bad cholesterol and lowers good cholesterol. Polyunsaturated and Monounsaturated Fats.  More healthful fats. These fats are from plant sources. Total Carbohydrate.  The number of carbohydrate grams in a serving of the product. Under total carbohydrate are listed the other carbohydrate sources, such as dietary fiber and sugars. Dietary Fiber.  A carbohydrate from plant sources. Sugars.  Sugars listed on the label contain all naturally occurring sugars as well as added sugars. LABELING TERMS NOT REGULATED BY THE FDA Sugarless.  Table sugar (sucrose) has not been added. However, the manufacturer may use another form of sugar in place of sucrose to sweeten the product. For example, sugar alcohols are used to sweeten foods. Sugar alcohols are a form of sugar but are not table sugar. If a product contains sugar alcohols in place of sucrose, it can still be labeled "sugarless." Low Salt, Salt-Free, Unsalted, No Salt, No Salt Added, Without Added Salt.  Food that is usually processed with salt has been made without salt. However, the food may contain sodium-containing additives, such as preservatives, leavening agents, or  flavorings. Natural.  This term has no legal meaning. Organic.  Foods that are certified as organic have been inspected and approved by the USDA to ensure they are produced without pesticides, fertilizers containing synthetic ingredients, bioengineering, or ionizing radiation. Document Released: 08/28/2003 Document Revised: 11/17/2011 Document Reviewed: 03/15/2009 [facility] Patient Information 2015 [place], [place]. This information is not intended to replace advice given to you by your health care provider. Make sure you discuss any questions you have with your health care provider.  Diabetes Mellitus and Food It is important for you to manage your blood sugar (glucose) level. Your blood glucose level can be greatly affected by what you eat. Eating healthier foods in the appropriate amounts throughout the day at about the same time each day will help you control your blood glucose level. It can also help slow or prevent worsening of your diabetes mellitus. Healthy eating may even help you improve the level of your blood pressure and reach or maintain a healthy weight.  HOW CAN FOOD AFFECT ME? Carbohydrates Carbohydrates affect your blood glucose level more than any other type of food. Your dietitian will help you determine how many carbohydrates to eat  at each meal and teach you how to count carbohydrates. Counting carbohydrates is important to keep your blood glucose at a healthy level, especially if you are using insulin or taking certain medicines for diabetes mellitus. Alcohol Alcohol can cause sudden decreases in blood glucose (hypoglycemia), especially if you use insulin or take certain medicines for diabetes mellitus. Hypoglycemia can be a life-threatening condition. Symptoms of hypoglycemia (sleepiness, dizziness, and disorientation) are similar to symptoms of having too much alcohol.  If your health care provider has given you approval to drink alcohol, do so in moderation and use the  following guidelines:  Women should not have more than one drink per day, and men should not have more than two drinks per day. One drink is equal to:  12 oz of beer.  5 oz of wine.  1 oz of hard liquor.  Do not drink on an empty stomach.  Keep yourself hydrated. Have water, diet soda, or unsweetened iced tea.  Regular soda, juice, and other mixers might contain a lot of carbohydrates and should be counted. WHAT FOODS ARE NOT RECOMMENDED? As you make food choices, it is important to remember that all foods are not the same. Some foods have fewer nutrients per serving than other foods, even though they might have the same number of calories or carbohydrates. It is difficult to get your body what it needs when you eat foods with fewer nutrients. Examples of foods that you should avoid that are high in calories and carbohydrates but low in nutrients include:  Trans fats (most processed foods list trans fats on the Nutrition Facts label).  Regular soda.  Juice.  Candy.  Sweets, such as cake, pie, doughnuts, and cookies.  Fried foods. WHAT FOODS CAN I EAT? Have nutrient-rich foods, which will nourish your body and keep you healthy. The food you should eat also will depend on several factors, including:  The calories you need.  The medicines you take.  Your weight.  Your blood glucose level.  Your blood pressure level.  Your cholesterol level. You also should eat a variety of foods, including:  Protein, such as meat, poultry, fish, tofu, nuts, and seeds (lean animal proteins are best).  Fruits.  Vegetables.  Dairy products, such as milk, cheese, and yogurt (low fat is best).  Breads, grains, pasta, cereal, rice, and beans.  Fats such as olive oil, trans fat-free margarine, canola oil, avocado, and olives. DOES EVERYONE WITH DIABETES MELLITUS HAVE THE SAME MEAL PLAN? Because every person with diabetes mellitus is different, there is not one meal plan that works for  everyone. It is very important that you meet with a dietitian who will help you create a meal plan that is just right for you. Document Released: 05/22/2005 Document Revised: 08/30/2013 Document Reviewed: 07/22/2013 [facility] Patient Information 2015 [place], [place]. This information is not intended to replace advice given to you by your health care provider. Make sure you discuss any questions you have with your health care provider.

## 2014-03-24 ENCOUNTER — Telehealth: Payer: Self-pay | Admitting: *Deleted

## 2014-03-24 NOTE — Telephone Encounter (Signed)
Returned pt's phone call to inform her a Rx for Augmentin will be sent to [facility] to treat tooth abscess per Charlestine Massed, NP. Message to be forwarded to Charlestine Massed, NP.

## 2014-03-25 ENCOUNTER — Encounter: Payer: Self-pay | Admitting: Adult Health

## 2014-03-30 ENCOUNTER — Ambulatory Visit (HOSPITAL_BASED_OUTPATIENT_CLINIC_OR_DEPARTMENT_OTHER): Payer: BC Managed Care – PPO | Admitting: Adult Health

## 2014-03-30 ENCOUNTER — Other Ambulatory Visit: Payer: Self-pay | Admitting: Oncology

## 2014-03-30 ENCOUNTER — Other Ambulatory Visit (HOSPITAL_BASED_OUTPATIENT_CLINIC_OR_DEPARTMENT_OTHER): Payer: BC Managed Care – PPO

## 2014-03-30 ENCOUNTER — Ambulatory Visit (HOSPITAL_BASED_OUTPATIENT_CLINIC_OR_DEPARTMENT_OTHER): Payer: BC Managed Care – PPO

## 2014-03-30 ENCOUNTER — Encounter: Payer: Self-pay | Admitting: Adult Health

## 2014-03-30 VITALS — BP 174/84 | HR 94 | Temp 98.3°F | Resp 18 | Ht 65.0 in | Wt 250.0 lb

## 2014-03-30 DIAGNOSIS — Z171 Estrogen receptor negative status [ER-]: Secondary | ICD-10-CM

## 2014-03-30 DIAGNOSIS — I10 Essential (primary) hypertension: Secondary | ICD-10-CM

## 2014-03-30 DIAGNOSIS — C50511 Malignant neoplasm of lower-outer quadrant of right female breast: Secondary | ICD-10-CM

## 2014-03-30 DIAGNOSIS — D649 Anemia, unspecified: Secondary | ICD-10-CM

## 2014-03-30 DIAGNOSIS — C50919 Malignant neoplasm of unspecified site of unspecified female breast: Secondary | ICD-10-CM

## 2014-03-30 DIAGNOSIS — Z5111 Encounter for antineoplastic chemotherapy: Secondary | ICD-10-CM

## 2014-03-30 LAB — COMPREHENSIVE METABOLIC PANEL (CC13)
ALK PHOS: 168 U/L — AB (ref 40–150)
ALT: 21 U/L (ref 0–55)
AST: 18 U/L (ref 5–34)
Albumin: 3.6 g/dL (ref 3.5–5.0)
Anion Gap: 10 mEq/L (ref 3–11)
BUN: 15.8 mg/dL (ref 7.0–26.0)
CO2: 22 mEq/L (ref 22–29)
Calcium: 9.2 mg/dL (ref 8.4–10.4)
Chloride: 107 mEq/L (ref 98–109)
Creatinine: 1.2 mg/dL — ABNORMAL HIGH (ref 0.6–1.1)
Glucose: 240 mg/dl — ABNORMAL HIGH (ref 70–140)
Potassium: 4.4 mEq/L (ref 3.5–5.1)
SODIUM: 139 meq/L (ref 136–145)
TOTAL PROTEIN: 7 g/dL (ref 6.4–8.3)
Total Bilirubin: 0.23 mg/dL (ref 0.20–1.20)

## 2014-03-30 LAB — CBC WITH DIFFERENTIAL/PLATELET
BASO%: 0.5 % (ref 0.0–2.0)
Basophils Absolute: 0.1 10*3/uL (ref 0.0–0.1)
EOS%: 0.2 % (ref 0.0–7.0)
Eosinophils Absolute: 0 10*3/uL (ref 0.0–0.5)
HCT: 29.1 % — ABNORMAL LOW (ref 34.8–46.6)
HGB: 9 g/dL — ABNORMAL LOW (ref 11.6–15.9)
LYMPH%: 9.1 % — ABNORMAL LOW (ref 14.0–49.7)
MCH: 25.4 pg (ref 25.1–34.0)
MCHC: 30.8 g/dL — AB (ref 31.5–36.0)
MCV: 82.5 fL (ref 79.5–101.0)
MONO#: 1.4 10*3/uL — AB (ref 0.1–0.9)
MONO%: 11.6 % (ref 0.0–14.0)
NEUT#: 9.5 10*3/uL — ABNORMAL HIGH (ref 1.5–6.5)
NEUT%: 78.6 % — ABNORMAL HIGH (ref 38.4–76.8)
PLATELETS: 151 10*3/uL (ref 145–400)
RBC: 3.53 10*6/uL — ABNORMAL LOW (ref 3.70–5.45)
RDW: 16.6 % — ABNORMAL HIGH (ref 11.2–14.5)
WBC: 12.1 10*3/uL — AB (ref 3.9–10.3)
lymph#: 1.1 10*3/uL (ref 0.9–3.3)

## 2014-03-30 MED ORDER — HEPARIN SOD (PORK) LOCK FLUSH 100 UNIT/ML IV SOLN
500.0000 [IU] | Freq: Once | INTRAVENOUS | Status: AC | PRN
  Administered 2014-03-30: 500 [IU]
  Filled 2014-03-30: qty 5

## 2014-03-30 MED ORDER — PALONOSETRON HCL INJECTION 0.25 MG/5ML
INTRAVENOUS | Status: AC
  Filled 2014-03-30: qty 5

## 2014-03-30 MED ORDER — DOXORUBICIN HCL CHEMO IV INJECTION 2 MG/ML
60.0000 mg/m2 | Freq: Once | INTRAVENOUS | Status: AC
  Administered 2014-03-30: 136 mg via INTRAVENOUS
  Filled 2014-03-30: qty 68

## 2014-03-30 MED ORDER — PROCHLORPERAZINE EDISYLATE 5 MG/ML IJ SOLN: 10.0000 mg | Freq: Four times a day (QID) | INTRAMUSCULAR | Status: DC | PRN

## 2014-03-30 MED ORDER — PALONOSETRON HCL INJECTION 0.25 MG/5ML
0.2500 mg | Freq: Once | INTRAVENOUS | Status: AC
  Administered 2014-03-30: 0.25 mg via INTRAVENOUS

## 2014-03-30 MED ORDER — SODIUM CHLORIDE 0.9 % IV SOLN
150.0000 mg | Freq: Once | INTRAVENOUS | Status: AC
  Administered 2014-03-30: 150 mg via INTRAVENOUS
  Filled 2014-03-30: qty 5

## 2014-03-30 MED ORDER — SODIUM CHLORIDE 0.9 % IJ SOLN
10.0000 mL | INTRAMUSCULAR | Status: DC | PRN
  Administered 2014-03-30: 10 mL
  Filled 2014-03-30: qty 10

## 2014-03-30 MED ORDER — SODIUM CHLORIDE 0.9 % IV SOLN
600.0000 mg/m2 | Freq: Once | INTRAVENOUS | Status: AC
  Administered 2014-03-30: 1360 mg via INTRAVENOUS
  Filled 2014-03-30: qty 68

## 2014-03-30 MED ORDER — SODIUM CHLORIDE 0.9 % IV SOLN
Freq: Once | INTRAVENOUS | Status: AC
  Administered 2014-03-30: 15:00:00 via INTRAVENOUS

## 2014-03-30 NOTE — Patient Instructions (Signed)
[facility] Discharge Instructions for Patients Receiving Chemotherapy  Today you received the following chemotherapy agents doxorubicin/cytoxan.    To help prevent nausea and vomiting after your treatment, we encourage you to take your nausea medication as directed   If you develop nausea and vomiting that is not controlled by your nausea medication, call the clinic.   BELOW ARE SYMPTOMS THAT SHOULD BE REPORTED IMMEDIATELY:  *FEVER GREATER THAN 100.5 F  *CHILLS WITH OR WITHOUT FEVER  NAUSEA AND VOMITING THAT IS NOT CONTROLLED WITH YOUR NAUSEA MEDICATION  *UNUSUAL SHORTNESS OF BREATH  *UNUSUAL BRUISING OR BLEEDING  TENDERNESS IN MOUTH AND THROAT WITH OR WITHOUT PRESENCE OF ULCERS  *URINARY PROBLEMS  *BOWEL PROBLEMS  UNUSUAL RASH Items with * indicate a potential emergency and should be followed up as soon as possible.  Feel free to call the clinic you have any questions or concerns. The clinic phone number is [phone].  

## 2014-03-30 NOTE — Progress Notes (Signed)
[facility]  Telephone:([phone]) [phone] Fax:(336) [phone]     ID: [id number] HOUPT OB: November 12, 1955  MR#: [id number]  MZR#:[id number]  PCP: Eartha Inch, MD GYN:   SU: Emelia Loron OTHER MD:  CHIEF COMPLAINT: Triple negative breast cancer  CURRENT TREATMENT: Adjuvant chemotherapy  BREAST CANCER HISTORY: From the original intake note 01/18/2014:  The patient had routine bilateral screening mammography at the breast Center for 10/28/2013 showing a possible mass in the right breast. Additional views and right breast ultrasonography 12/20/2013 confirmed an irregular 1.3 cm right breast mass at the 6:00 position. This was not palpable. Ultrasound showed an irregular hypoechoic mass in the right breast measuring 1.4 cm. There was no right axillary adenopathy identified.  On 12/29/2013 the patient underwent right breast biopsy, showing ([id number]) and invasive ductal carcinoma, grade 3, estrogen and progesterone receptor negative, HER-2 not amplified with a signals ratio of 1.35 and a copy number per cell of 2.10, and an MIB-1 of 80%.  On 01/12/2014 the patient underwent bilateral breast MRI. This showed the mass in the right breast lower outer quadrant to measure 2.2 cm maximally. There was a 2 cm well circumscribed mass in the medial right breast which has been stable since 2011 him a and a 1 cm oval mass in the upper midline of the right breast, likely an intramammary lymph node, with benign features. A right axillary lymph node with cortical thickening measured 0.9 cm. The fatty hilum was possibly effaced; note Right axilla had been benign by [place] before biopsy. In the left breast there were no findings of concern.  The patient's subsequent history is as detailed below.  INTERVAL HISTORY: Deajah is here for evaluation after receiving her second cycle of adjuvant Doxorubicin and cyclophosphamide.  She receives this treatment on day 1 of a 14 day cycle with Neulasta given  on day 2 for granulocyte support.    Annaleah is doing well today. She is here for follow up and evaluation prior to receiving her third cycle of Doxorubicin and Cyclophosphamide.  She has not yet gotten lotion for her dry skin or tea tree oil for her fingernails.  She has yet to have gotten her glucometer fixed.  She is taking her oral anti-hyperglycemics as prescribed by her PCP.  She denies fevers, chills, nausea, vomiting, constipation, diarrhea, numbness/tingling, or any further concerns.    REVIEW OF SYSTEMS: A detailed 10 point review of systems is been assessed and pertinent symptoms as mentioned in interval history    PAST MEDICAL HISTORY: Past Medical History  Diagnosis Date  . HTN (hypertension)   . Diabetes mellitus   . High cholesterol   . Breast cancer   . Family history of malignant neoplasm of breast   . Glaucoma   . Wears glasses   . Wears dentures     full top-partial bottom    PAST SURGICAL HISTORY: Past Surgical History  Procedure Laterality Date  . Transphenoidal pituitary resection  2006  . Abdominal hysterectomy  1993  . Breast biopsy  2000    LEFT  . Tonsillectomy    . Portacath placement Left 02/08/2014    Procedure: INSERTION PORT-A-CATH;  Surgeon: Emelia Loron, MD;  Location: [facility];  Service: General;  Laterality: Left;    FAMILY HISTORY Family History  Problem Relation Age of Onset  . Hypertension Mother   . Diabetes type II Mother   . Breast cancer Mother     dx ~36; deceased 56  .  Hypertension Father   . Breast cancer Maternal Aunt     deceased 46  . Cancer Maternal Uncle     unk. type; deceased 47s  . Cancer Maternal Uncle     unk. type; deceased late 45s  . Uterine cancer Cousin 56    daughter of an unaffected mat aunt who is 27   the patient's father is in his early 58s. The patient's mother died at the age of 56, from complications of breast cancer which had been diagnosed "years before". The patient had 2  brothers, 2 sisters. The only other breast cancer in the family was a maternal aunt who died from breast cancer at the age of 85. (This means that 2 of 3 sisters, including the patient's mother, had breast cancer, one of them before the age of 74). There is no history of ovarian cancer in the family.  GYNECOLOGIC HISTORY:  Menarche age 41, first live birth age 37. The patient is GX P1. She underwent total abdominal hysterectomy with bilateral salpingo-oophorectomy at age 31. She took hormone replacement for one or 2 months old.  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 The patient is a widow. She lives by herself, with no pets. Her daughter Kialee Kham lives in [place], where she works in a Geneticist, molecular. The patient has no grandchildren. She is a Psychologist, forensic    ADVANCED DIRECTIVES: Not in place; advanced directives were discussed with the patient at her initial visit 01/18/2014. She intends to name her daughter as healthcare power of attorney. [name] can be reached at [phone]   HEALTH MAINTENANCE: History  Substance Use Topics  . Smoking status: Former Smoker    Types: Cigarettes    Quit date: 01/31/1994  . Smokeless tobacco: Not on file  . Alcohol Use: Yes     Comment: occ     Colonoscopy:  PAP: Status post hysterectomy  Bone density: Remote  Lipid panel:  No Known Allergies  Current Outpatient Prescriptions  Medication Sig Dispense Refill  . amLODipine (NORVASC) 10 MG tablet TAKE ONE TABLET BY MOUTH ONCE DAILY      . Atorvastatin Calcium (LIPITOR PO) Take 40 mg by mouth daily.       . ciprofloxacin (CIPRO) 250 MG tablet Take 1 tablet (250 mg total) by mouth 2 (two) times daily.  14 tablet  0  . dexamethasone (DECADRON) 0.1 % ophthalmic solution Place 1  drop into both eyes daily.      Marland Kitchen glyBURIDE (DIABETA) 5 MG tablet Take 5 mg by mouth daily with breakfast.       . latanoprost (XALATAN) 0.005 % ophthalmic solution Place 1 drop into both eyes at bedtime.      . lidocaine-prilocaine (EMLA) cream Apply 1 application topically as needed. Apply over port site 1-2 hours before chemo, then cover with plastic wrap  30 g  0  . lisinopril-hydrochlorothiazide (PRINZIDE,ZESTORETIC) 20-12.5 MG per tablet Take 2 tablets by mouth daily.       Marland Kitchen LORazepam (ATIVAN) 0.5 MG tablet Take 1 tablet (0.5 mg total) by mouth at bedtime as needed (Nausea or vomiting).  30 tablet  0  . metFORMIN (GLUCOPHAGE-XR) 500 MG 24 hr tablet Take 500 mg by mouth 2 (two) times daily.       Marland Kitchen  ondansetron (ZOFRAN) 8 MG tablet Take 1 tablet (8 mg total) by mouth every 8 (eight) hours as needed for nausea or vomiting.  60 tablet  2  . tiZANidine (ZANAFLEX) 4 MG tablet 4 mg at bedtime.       Marland Kitchen oxyCODONE-acetaminophen (PERCOCET) 10-325 MG per tablet Take 1 tablet by mouth every 6 (six) hours as needed for pain.  20 tablet  0  . prochlorperazine (COMPAZINE) 10 MG tablet Take 1 tablet (10 mg total) by mouth every 6 (six) hours as needed (Nausea or vomiting).  30 tablet  1   No current facility-administered medications for this visit.    OBJECTIVE: Middle-aged [place] American woman who appears stated age. She is obese not in acute distress  Filed Vitals:   03/30/14 1341  BP: 174/84  Pulse: 94  Temp: 98.3 F (36.8 C)  Resp: 18     Body mass index is 41.6 kg/(m^2).    ECOG FS:1 - Symptomatic but completely ambulatory  GENERAL: Patient is a well appearing female in no acute distress HEENT:  Sclerae anicteric.  Oropharynx clear and moist. No ulcerations or evidence of oropharyngeal candidiasis. Neck is supple.  NODES:  No cervical, supraclavicular, or axillary lymphadenopathy palpated.  BREAST EXAM:  Deferred. LUNGS:  Clear to auscultation bilaterally.  No wheezes or rhonchi. HEART:   Regular rate and rhythm. No murmur appreciated. ABDOMEN:  Soft, nontender.  Positive, normoactive bowel sounds. No organomegaly palpated. MSK:  No focal spinal tenderness to palpation. Full range of motion bilaterally in the upper extremities. EXTREMITIES:  No peripheral edema.   SKIN:  Skin is slightly dry, at the base of her fingernails she does have some hyperpigmentation, but the nails do not appear brittle NEURO:  Nonfocal. Well oriented.  Appropriate affect.    LAB RESULTS:  CMP     Component Value Date/Time   NA 132* 03/23/2014 1318   NA 141 02/03/2014 1400    I No results found for this basename: SPEP,  UPEP,   kappa and lambda light chains    Lab Results  Component Value Date   WBC 12.1* 03/30/2014   NEUTROABS 9.5* 03/30/2014   HGB 9.0* 03/30/2014   HCT 29.1* 03/30/2014   MCV 82.5 03/30/2014   PLT 151 03/30/2014      Chemistry      Component Value Date/Time   NA 132* 03/23/2014 1318   NA 141 02/03/2014 1400      Component Value Date/Time   CALCIUM 9.7 03/23/2014 1318   CALCIUM 10.0 02/03/2014 1400       Lab Results  Component Value Date   LABCA2 51* 02/03/2014    No components found with this basename: [id number]    No results found for this basename: INR,  in the last 168 hours  Urinalysis No results found for this basename: colorurine,  appearanceur,  labspec,  phurine,  glucoseu,  hgbur,  bilirubinur,  ketonesur,  proteinur,  urobilinogen,  nitrite,  leukocytesur    STUDIES: Nm Sentinel Node Inj-no Rpt (breast)  02/08/2014   CLINICAL DATA: right axillary sentinel node biopsy   Sulfur colloid was injected intradermally by the nuclear medicine  technologist for breast cancer sentinel node localization.    Mm Breast Surgical Specimen  02/08/2014   CLINICAL DATA:  Right lumpectomy for invasive mammary carcinoma following radioactive seed localization.  EXAM: SPECIMEN RADIOGRAPH OF THE RIGHT BREAST  COMPARISON:  Previous exam(s)  FINDINGS: Status post excision of  the right breast. The radioactive seed, mass  and biopsy marker clip are present and are marked for pathology.  IMPRESSION: Specimen radiograph of the right breast.   Electronically Signed   By: Enrique Sack M.D.   On: 02/08/2014 14:54   Dg Chest Port 1 View  02/08/2014   CLINICAL DATA:  Breast carcinoma and status post Port-A-Cath placement.  EXAM: PORTABLE CHEST - 1 VIEW  COMPARISON:  09/02/2005  FINDINGS: Left subclavian Port-A-Cath has been placed with the catheter tip in the upper SVC. No pneumothorax. The heart is mildly enlarged. No pleural fluid. Bibasilar atelectasis present.  IMPRESSION: No pneumothorax or other acute findings after Port-A-Cath placement. The catheter tip is in the upper SVC.   Electronically Signed   By: Aletta Edouard M.D.   On: 02/08/2014 16:23   Dg Fluoro Guide Cv Line-no Report  02/08/2014   CLINICAL DATA: Port Insertion [facility] OR 8   FLOURO GUIDE CV LINE  Fluoroscopy was utilized by the requesting physician.  No radiographic  interpretation.    Mm Rt Plc Breast Loc Dev   1st Lesion  Inc Mammo Guide  02/02/2014   CLINICAL DATA:  Patient with recent diagnosis right breast invasive carcinoma.  EXAM: MAMMOGRAPHIC GUIDED RADIOACTIVE SEED LOCALIZATION OF THE RIGHT BREAST  COMPARISON:  Previous exam(s)  FINDINGS: Patient presents for radioactive seed localization prior to right breast lumpectomy. I met with the patient and we discussed the procedure of seed localization including benefits and alternatives. We discussed the high likelihood of a successful procedure. We discussed the risks of the procedure including infection, bleeding, tissue injury and further surgery. We discussed the low dose of radioactivity involved in the procedure. Informed, written consent was given.  The usual time-out protocol was performed immediately prior to the procedure.  Using mammographic guidance, sterile technique, 2% lidocaine and an I-125 radioactive seed, biopsy marking clip in right breast mass was  localized using a lateral approach. The follow-up mammogram images confirm the seed in the expected location and are marked for Dr. Donne Hazel.  Follow-up survey of the patient confirms presents of radioactive seed, biopsy marking clip and mass.  Order number of I-125 seed:  [id number].  Dose of I-125 seed:  0.250 mCi.  The patient tolerated the procedure well and was released from the [facility]. She was given instructions regarding seed removal.  IMPRESSION: Radioactive seed localization right breast. No apparent complications.   Electronically Signed   By: Lovey Newcomer M.D.   On: 02/02/2014 15:11   ASSESSMENT: 58 y.o. BRCA negative [place] woman status post right breast biopsy 12/29/2013 for a clinical T2 N1, stage IIB invasive ductal carcinoma, grade 3, triple negative, with an MIB-1 of 80%.  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  Her labs are stable and I reviewed them with her in detail.  She has not yet gotten a working glucometer and we have discussed this with her PCPs office.  However, she will not receive Dexamethasone prior to her chemotherapy today,  and I reminded her not to take Dexamethasone following treatment as well.  She tells me that she will go and get the tea tree oil and cetaphil lotion this week.    Vinaya will proceed with cycle 3 of Doxorubicin and Cyclophosphamide today.  She will return  tomorrow for Neulasta and in one week for labs and evaluation of chemotoxicities. She knows to call us in the interim for any questions or concerns.  We can certainly see her sooner if needed.  This plan was reviewed in detail with Dr. Jana Hakim.  I spent 25 minutes counseling the patient face to face.  The total time spent in the appointment was 30 minutes.  Minette Headland, [place] [phone] 03/30/2014 2:01 PM

## 2014-03-31 ENCOUNTER — Ambulatory Visit (HOSPITAL_BASED_OUTPATIENT_CLINIC_OR_DEPARTMENT_OTHER): Payer: BC Managed Care – PPO

## 2014-03-31 VITALS — BP 179/82 | HR 105

## 2014-03-31 DIAGNOSIS — C50511 Malignant neoplasm of lower-outer quadrant of right female breast: Secondary | ICD-10-CM

## 2014-03-31 DIAGNOSIS — Z5189 Encounter for other specified aftercare: Secondary | ICD-10-CM

## 2014-03-31 DIAGNOSIS — C50919 Malignant neoplasm of unspecified site of unspecified female breast: Secondary | ICD-10-CM

## 2014-03-31 MED ORDER — PEGFILGRASTIM INJECTION 6 MG/0.6ML
6.0000 mg | Freq: Once | SUBCUTANEOUS | Status: AC
  Administered 2014-03-31: 6 mg via SUBCUTANEOUS
  Filled 2014-03-31: qty 0.6

## 2014-04-05 ENCOUNTER — Other Ambulatory Visit: Payer: Self-pay | Admitting: *Deleted

## 2014-04-05 ENCOUNTER — Telehealth: Payer: Self-pay | Admitting: Adult Health

## 2014-04-05 DIAGNOSIS — C50511 Malignant neoplasm of lower-outer quadrant of right female breast: Secondary | ICD-10-CM

## 2014-04-05 NOTE — Telephone Encounter (Signed)
per LC to r/s pt to 7/31-pt agreed to come @ 8:45-labs @ 8:15. pT WANTED THE TIME FOR NEXT TRMT-ADV 8/6-1:15 LABS-cld Christian in trmt room-will sch trmt and call pt. Adv pt and pt understood

## 2014-04-06 ENCOUNTER — Ambulatory Visit: Payer: BC Managed Care – PPO | Admitting: Adult Health

## 2014-04-06 ENCOUNTER — Other Ambulatory Visit: Payer: BC Managed Care – PPO

## 2014-04-07 ENCOUNTER — Ambulatory Visit (HOSPITAL_BASED_OUTPATIENT_CLINIC_OR_DEPARTMENT_OTHER): Payer: BC Managed Care – PPO

## 2014-04-07 ENCOUNTER — Ambulatory Visit (HOSPITAL_BASED_OUTPATIENT_CLINIC_OR_DEPARTMENT_OTHER): Payer: BC Managed Care – PPO | Admitting: Adult Health

## 2014-04-07 ENCOUNTER — Encounter: Payer: Self-pay | Admitting: Adult Health

## 2014-04-07 ENCOUNTER — Telehealth: Payer: Self-pay | Admitting: *Deleted

## 2014-04-07 ENCOUNTER — Other Ambulatory Visit (HOSPITAL_BASED_OUTPATIENT_CLINIC_OR_DEPARTMENT_OTHER): Payer: BC Managed Care – PPO

## 2014-04-07 ENCOUNTER — Telehealth: Payer: Self-pay | Admitting: Adult Health

## 2014-04-07 VITALS — BP 151/83 | HR 102 | Temp 99.4°F | Resp 18 | Ht 65.0 in | Wt 248.9 lb

## 2014-04-07 VITALS — BP 136/78 | HR 96

## 2014-04-07 DIAGNOSIS — C50511 Malignant neoplasm of lower-outer quadrant of right female breast: Secondary | ICD-10-CM

## 2014-04-07 DIAGNOSIS — D702 Other drug-induced agranulocytosis: Secondary | ICD-10-CM

## 2014-04-07 DIAGNOSIS — B3731 Acute candidiasis of vulva and vagina: Secondary | ICD-10-CM

## 2014-04-07 DIAGNOSIS — C50919 Malignant neoplasm of unspecified site of unspecified female breast: Secondary | ICD-10-CM

## 2014-04-07 DIAGNOSIS — R7309 Other abnormal glucose: Secondary | ICD-10-CM

## 2014-04-07 DIAGNOSIS — R110 Nausea: Secondary | ICD-10-CM

## 2014-04-07 DIAGNOSIS — D649 Anemia, unspecified: Secondary | ICD-10-CM

## 2014-04-07 DIAGNOSIS — Z803 Family history of malignant neoplasm of breast: Secondary | ICD-10-CM

## 2014-04-07 DIAGNOSIS — R739 Hyperglycemia, unspecified: Secondary | ICD-10-CM

## 2014-04-07 DIAGNOSIS — B373 Candidiasis of vulva and vagina: Secondary | ICD-10-CM

## 2014-04-07 DIAGNOSIS — Z171 Estrogen receptor negative status [ER-]: Secondary | ICD-10-CM

## 2014-04-07 DIAGNOSIS — D709 Neutropenia, unspecified: Secondary | ICD-10-CM

## 2014-04-07 DIAGNOSIS — I10 Essential (primary) hypertension: Secondary | ICD-10-CM

## 2014-04-07 LAB — WHOLE BLOOD GLUCOSE
Glucose: 353 mg/dL — ABNORMAL HIGH (ref 70–100)
HRS PC: 1 Hours

## 2014-04-07 LAB — CBC WITH DIFFERENTIAL/PLATELET
BASO%: 2.4 % — ABNORMAL HIGH (ref 0.0–2.0)
BASOS ABS: 0 10*3/uL (ref 0.0–0.1)
EOS ABS: 0 10*3/uL (ref 0.0–0.5)
EOS%: 2.4 % (ref 0.0–7.0)
HEMATOCRIT: 25.9 % — AB (ref 34.8–46.6)
HEMOGLOBIN: 8.4 g/dL — AB (ref 11.6–15.9)
LYMPH#: 0.3 10*3/uL — AB (ref 0.9–3.3)
LYMPH%: 66.7 % — ABNORMAL HIGH (ref 14.0–49.7)
MCH: 25.8 pg (ref 25.1–34.0)
MCHC: 32.4 g/dL (ref 31.5–36.0)
MCV: 79.7 fL (ref 79.5–101.0)
MONO#: 0.1 10*3/uL (ref 0.1–0.9)
MONO%: 26.2 % — ABNORMAL HIGH (ref 0.0–14.0)
NEUT#: 0 10*3/uL — CL (ref 1.5–6.5)
NEUT%: 2.3 % — ABNORMAL LOW (ref 38.4–76.8)
Platelets: 148 10*3/uL (ref 145–400)
RBC: 3.25 10*6/uL — ABNORMAL LOW (ref 3.70–5.45)
RDW: 15.7 % — AB (ref 11.2–14.5)
WBC: 0.4 10*3/uL — CL (ref 3.9–10.3)
nRBC: 0 % (ref 0–0)

## 2014-04-07 LAB — COMPREHENSIVE METABOLIC PANEL (CC13)
ALBUMIN: 3.4 g/dL — AB (ref 3.5–5.0)
ALK PHOS: 143 U/L (ref 40–150)
ALT: 10 U/L (ref 0–55)
ANION GAP: 11 meq/L (ref 3–11)
AST: 8 U/L (ref 5–34)
BUN: 24.6 mg/dL (ref 7.0–26.0)
CALCIUM: 9.5 mg/dL (ref 8.4–10.4)
CO2: 20 meq/L — AB (ref 22–29)
Chloride: 103 mEq/L (ref 98–109)
Creatinine: 1.3 mg/dL — ABNORMAL HIGH (ref 0.6–1.1)
GLUCOSE: 348 mg/dL — AB (ref 70–140)
POTASSIUM: 4.5 meq/L (ref 3.5–5.1)
Sodium: 134 mEq/L — ABNORMAL LOW (ref 136–145)
Total Bilirubin: 0.31 mg/dL (ref 0.20–1.20)
Total Protein: 7 g/dL (ref 6.4–8.3)

## 2014-04-07 MED ORDER — INSULIN REGULAR HUMAN 100 UNIT/ML IJ SOLN
6.0000 [IU] | Freq: Once | INTRAMUSCULAR | Status: AC
Start: 2014-04-07 — End: 2014-04-07
  Administered 2014-04-07: 6 [IU] via SUBCUTANEOUS
  Filled 2014-04-07: qty 0.06

## 2014-04-07 MED ORDER — FLUCONAZOLE 200 MG PO TABS: 200.0000 mg | ORAL_TABLET | Freq: Every day | ORAL | Status: DC

## 2014-04-07 MED ORDER — CIPROFLOXACIN HCL 250 MG PO TABS: 250.0000 mg | ORAL_TABLET | Freq: Two times a day (BID) | ORAL | Status: DC

## 2014-04-07 MED ORDER — SODIUM CHLORIDE 0.9 % IV SOLN
Freq: Once | INTRAVENOUS | Status: AC
  Administered 2014-04-07: 10:00:00 via INTRAVENOUS

## 2014-04-07 NOTE — Patient Instructions (Signed)
Neutropenia Neutropenia is a condition that occurs when the level of a certain type of white blood cell (neutrophil) in your body becomes lower than normal. Neutrophils are made in the bone marrow and fight infections. These cells protect against bacteria and viruses. The fewer neutrophils you have, and the longer your body remains without them, the greater your risk of getting a severe infection becomes. CAUSES  The cause of neutropenia may be hard to determine. However, it is usually due to 3 main problems:   Decreased production of neutrophils. This may be due to:  Certain medicines such as chemotherapy.  Genetic problems.  Cancer.  Radiation treatments.  Vitamin deficiency.  Some pesticides.  Increased destruction of neutrophils. This may be due to:  Overwhelming infections.  Hemolytic anemia. This is when the body destroys its own blood cells.  Chemotherapy.  Neutrophils moving to areas of the body where they cannot fight infections. This may be due to:  Dialysis procedures.  Conditions where the spleen becomes enlarged. Neutrophils are held in the spleen and are not available to the rest of the body.  Overwhelming infections. The neutrophils are held in the area of the infection and are not available to the rest of the body. SYMPTOMS  There are no specific symptoms of neutropenia. The lack of neutrophils can result in an infection, and an infection can cause various problems. DIAGNOSIS  Diagnosis is made by a blood test. A complete blood count is performed. The normal level of neutrophils in human blood differs with age and race. Infants have lower counts than older children and adults. African Americans have lower counts than Caucasians or Asians. The average adult level is 1500 cells/mm3 of blood. Neutrophil counts are interpreted as follows:  Greater than 1000 cells/mm3 gives normal protection against infection.  500 to 1000 cells/mm3 gives an increased risk for  infection.  200 to 500 cells/mm3 is a greater risk for severe infection.  Lower than 200 cells/mm3 is a marked risk of infection. This may require hospitalization and treatment with antibiotic medicines. TREATMENT  Treatment depends on the underlying cause, severity, and presence of infections or symptoms. It also depends on your health. Your caregiver will discuss the treatment plan with you. Mild cases are often easily treated and have a good outcome. Preventative measures may also be started to limit your risk of infections. Treatment can include:  Taking antibiotics.  Stopping medicines that are known to cause neutropenia.  Correcting nutritional deficiencies by eating green vegetables to supply folic acid and taking vitamin B supplements.  Stopping exposure to pesticides if your neutropenia is related to pesticide exposure.  Taking a blood growth factor called sargramostim, pegfilgrastim, or filgrastim if you are undergoing chemotherapy for cancer. This stimulates white blood cell production.  Removal of the spleen if you have Felty's syndrome and have repeated infections. HOME CARE INSTRUCTIONS   Follow your caregiver's instructions about when you need to have blood work done.  Wash your hands often. Make sure others who come in contact with you also wash their hands.  Wash raw fruits and vegetables before eating them. They can carry bacteria and fungi.  Avoid people with colds or spreadable (contagious) diseases (chickenpox, herpes zoster, influenza).  Avoid large crowds.  Avoid construction areas. The dust can release fungus into the air.  Be cautious around children in daycare or school environments.  Take care of your respiratory system by coughing and deep breathing.  Bathe daily.  Protect your skin from cuts and   burns.  Do not work in the garden or with flowers and plants.  Care for the mouth before and after meals by brushing with a soft toothbrush. If you have  mucositis, do not use mouthwash. Mouthwash contains alcohol and can dry out the mouth even more.  Clean the area between the genitals and the anus (perineal area) after urination and bowel movements. Women need to wipe from front to back.  Use a water soluble lubricant during sexual intercourse and practice good hygiene after. Do not have intercourse if you are severely neutropenic. Check with your caregiver for guidelines.  Exercise daily as tolerated.  Avoid people who were vaccinated with a live vaccine in the past 30 days. You should not receive live vaccines (polio, typhoid).  Do not provide direct care for pets. Avoid animal droppings. Do not clean litter boxes and bird cages.  Do not share food utensils.  Do not use tampons, enemas, or rectal suppositories unless directed by your caregiver.  Use an electric razor to remove hair.  Wash your hands after handling magazines, letters, and newspapers. SEEK IMMEDIATE MEDICAL CARE IF:   You have a fever.  You have chills or start to shake.  You feel nauseous or vomit.  You develop mouth sores.  You develop aches and pains.  You have redness and swelling around open wounds.  Your skin is warm to the touch.  You have pus coming from your wounds.  You develop swollen lymph nodes.  You feel weak or fatigued.  You develop red streaks on the skin. MAKE SURE YOU:  Understand these instructions.  Will watch your condition.  Will get help right away if you are not doing well or get worse. Document Released: 02/14/2002 Document Revised: 11/17/2011 Document Reviewed: 03/14/2011 Exit[employer] Patient Information 2015 [employer]. This information is not intended to replace advice given to you by your health care provider. Make sure you discuss any questions you have with your health care provider.  

## 2014-04-07 NOTE — Progress Notes (Signed)
[name]  Telephone:([phone]) [phone] Fax:(336) [phone]     ID: [name] OB: 1956-02-11  MR#: [id number]  MHD#:[id number]  PCP: Penny Noon, MD GYN:   SU: [name] OTHER MD:  CHIEF COMPLAINT: Triple negative breast cancer  CURRENT TREATMENT: Adjuvant chemotherapy  BREAST CANCER HISTORY: From the original intake note 01/18/2014:  The patient had routine bilateral screening mammography at the breast Center for 10/28/2013 showing a possible mass in the right breast. Additional views and right breast ultrasonography 12/20/2013 confirmed an irregular 1.3 cm right breast mass at the 6:00 position. This was not palpable. Ultrasound showed an irregular hypoechoic mass in the right breast measuring 1.4 cm. There was no right axillary adenopathy identified.  On 12/29/2013 the patient underwent right breast biopsy, showing ([id number]) and invasive ductal carcinoma, grade 3, estrogen and progesterone receptor negative, HER-2 not amplified with a signals ratio of 1.35 and a copy number per cell of 2.10, and an MIB-1 of 80%.  On 01/12/2014 the patient underwent bilateral breast MRI. This showed the mass in the right breast lower outer quadrant to measure 2.2 cm maximally. There was a 2 cm well circumscribed mass in the medial right breast which has been stable since 2011 him a and a 1 cm oval mass in the upper midline of the right breast, likely an intramammary lymph node, with benign features. A right axillary lymph node with cortical thickening measured 0.9 cm. The fatty hilum was possibly effaced; note Right axilla had been benign by [place] before biopsy. In the left breast there were no findings of concern.  The patient's subsequent history is as detailed below.  INTERVAL HISTORY: [name] is here for evaluation after receiving her second cycle of adjuvant Doxorubicin and cyclophosphamide.  She receives this treatment on day 1 of a 14 day cycle with Neulasta given  on day 2 for granulocyte support.    [name] is here today for evaluation after receiving her third cycle of Doxorubicin and Cyclophosphamide.  She is currently cycle 3 day 8 of treatment.  She is feeling increasingly nauseated today.  She has been taking Compazine as needed.  She hasn't taken it every day, maybe every other day.  She has been on Augmentin for a possible dental abscess.  She has been tolerating it well, however feels like she is getting vaginal candida due to this.  She denies fevers, chills, vomiting, constipation, diarrhea, numbness/tingling, skin changes, or any further concerns.    REVIEW OF SYSTEMS: A detailed 10 point review of systems is been assessed and pertinent symptoms as mentioned in interval history    PAST MEDICAL HISTORY: Past Medical History  Diagnosis Date  . HTN (hypertension)   . Diabetes mellitus   . High cholesterol   . Breast cancer   . Family history of malignant neoplasm of breast   . Glaucoma   . Wears glasses   . Wears dentures     full top-partial bottom    PAST SURGICAL HISTORY: Past Surgical History  Procedure Laterality Date  . Transphenoidal pituitary resection  2006  . Abdominal hysterectomy  1993  . Breast biopsy  2000    LEFT  . Tonsillectomy    . Portacath placement Left 02/08/2014    Procedure: INSERTION PORT-A-CATH;  Surgeon: Penny Bookbinder, MD;  Location: [facility];  Service: General;  Laterality: Left;    FAMILY HISTORY Family History  Problem Relation Age of Onset  . Hypertension Mother   . Diabetes type  II Mother   . Breast cancer Mother     dx ~80; deceased 71  . Hypertension Father   . Breast cancer Maternal Aunt     deceased 83  . Cancer Maternal Uncle     unk. type; deceased 19s  . Cancer Maternal Uncle     unk. type; deceased late 66s  . Uterine cancer Cousin 91    daughter of an unaffected mat aunt who is 37   the patient's father is in his early 56s. The patient's mother died at the age  of 71, from complications of breast cancer which had been diagnosed "years before". The patient had 2 brothers, 2 sisters. The only other breast cancer in the family was a maternal aunt who died from breast cancer at the age of 23. (This means that 2 of 3 sisters, including the patient's mother, had breast cancer, one of them before the age of 20). There is no history of ovarian cancer in the family.  GYNECOLOGIC HISTORY:  Menarche age 77, first live birth age 65. The patient is GX P1. She underwent total abdominal hysterectomy with bilateral salpingo-oophorectomy at age 69. She took hormone replacement for one or 2 months old.  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 The patient is a widow. She lives by herself, with no pets. Her daughter [name] lives in [place], where she works in a Geneticist, molecular. The patient has no grandchildren. She is a Psychologist, forensic    ADVANCED DIRECTIVES: Not in place; advanced directives were discussed with the patient at her initial visit 01/18/2014. She intends to name her daughter as healthcare power of attorney. [name] can be reached at [phone]   HEALTH MAINTENANCE: History  Substance Use Topics  . Smoking status: Former Smoker    Types: Cigarettes    Quit date: 01/31/1994  . Smokeless tobacco: Not on file  . Alcohol Use: Yes     Comment: occ     Colonoscopy:  PAP: Status post hysterectomy  Bone density: Remote  Lipid panel:  No Known Allergies  Current Outpatient Prescriptions  Medication Sig Dispense Refill  . amLODipine (NORVASC) 10 MG tablet TAKE ONE TABLET BY MOUTH ONCE DAILY      . Atorvastatin Calcium (LIPITOR PO) Take 40 mg by mouth daily.       Marland Kitchen dexamethasone (DECADRON) 0.1 % ophthalmic solution Place 1 drop into  both eyes daily.      Marland Kitchen glyBURIDE (DIABETA) 5 MG tablet Take 5 mg by mouth daily with breakfast.       . latanoprost (XALATAN) 0.005 % ophthalmic solution Place 1 drop into both eyes at bedtime.      Marland Kitchen lisinopril-hydrochlorothiazide (PRINZIDE,ZESTORETIC) 20-12.5 MG per tablet Take 2 tablets by mouth daily.       . metFORMIN (GLUCOPHAGE-XR) 500 MG 24 hr tablet Take 500 mg by mouth 2 (two) times daily.       . prochlorperazine (COMPAZINE) 10 MG tablet Take 1 tablet (10 mg total) by mouth every 6 (six) hours as needed (Nausea or vomiting).  30 tablet  1  . tiZANidine (ZANAFLEX) 4 MG tablet 4 mg at bedtime.       . ciprofloxacin (CIPRO) 250 MG tablet Take 1 tablet (250 mg total) by mouth 2 (two) times  daily.  14 tablet  0  . lidocaine-prilocaine (EMLA) cream Apply 1 application topically as needed. Apply over port site 1-2 hours before chemo, then cover with plastic wrap  30 g  0  . LORazepam (ATIVAN) 0.5 MG tablet Take 1 tablet (0.5 mg total) by mouth at bedtime as needed (Nausea or vomiting).  30 tablet  0  . ondansetron (ZOFRAN) 8 MG tablet Take 1 tablet (8 mg total) by mouth every 8 (eight) hours as needed for nausea or vomiting.  60 tablet  2  . oxyCODONE-acetaminophen (PERCOCET) 10-325 MG per tablet Take 1 tablet by mouth every 6 (six) hours as needed for pain.  20 tablet  0   No current facility-administered medications for this visit.    OBJECTIVE: Middle-aged [place] American woman who appears stated age. She is obese not in acute distress  Filed Vitals:   04/07/14 0834  BP: 151/83  Pulse: 102  Temp: 99.4 F (37.4 C)  Resp: 18     Body mass index is 41.42 kg/(m^2).    ECOG FS:1 - Symptomatic but completely ambulatory  GENERAL: Patient is a well appearing female in no acute distress HEENT:  Sclerae anicteric.  Oropharynx clear and moist. No ulcerations or evidence of oropharyngeal candidiasis. Neck is supple.  NODES:  No cervical, supraclavicular, or axillary lymphadenopathy palpated.   BREAST EXAM:  Deferred. LUNGS:  Clear to auscultation bilaterally.  No wheezes or rhonchi. HEART:  Regular rate and rhythm. No murmur appreciated. ABDOMEN:  Soft, nontender.  Positive, normoactive bowel sounds. No organomegaly palpated. MSK:  No focal spinal tenderness to palpation. Full range of motion bilaterally in the upper extremities. EXTREMITIES:  No peripheral edema.   SKIN:  Skin is slightly dry, at the base of her fingernails she does have some hyperpigmentation, but the nails do not appear brittle NEURO:  Nonfocal. Well oriented.  Appropriate affect.    LAB RESULTS:  CMP     Component Value Date/Time   NA 134* 04/07/2014 0815   NA 141 02/03/2014 1400    I No results found for this basename: SPEP,  UPEP,   kappa and lambda light chains    Lab Results  Component Value Date   WBC 0.4* 04/07/2014   NEUTROABS 0.0* 04/07/2014   HGB 8.4* 04/07/2014   HCT 25.9* 04/07/2014   MCV 79.7 04/07/2014   PLT 148 04/07/2014      Chemistry      Component Value Date/Time   NA 134* 04/07/2014 0815   NA 141 02/03/2014 1400      Component Value Date/Time   CALCIUM 9.5 04/07/2014 0815   CALCIUM 10.0 02/03/2014 1400       Lab Results  Component Value Date   LABCA2 51* 02/03/2014    No components found with this basename: [id number]    No results found for this basename: INR,  in the last 168 hours  Urinalysis No results found for this basename: colorurine,  appearanceur,  labspec,  phurine,  glucoseu,  hgbur,  bilirubinur,  ketonesur,  proteinur,  urobilinogen,  nitrite,  leukocytesur    STUDIES: Nm Sentinel Node Inj-no Rpt (breast)  02/08/2014   CLINICAL DATA: right axillary sentinel node biopsy   Sulfur colloid was injected intradermally by the nuclear medicine  technologist for breast cancer sentinel node localization.    Mm Breast Surgical Specimen  02/08/2014   CLINICAL DATA:  Right lumpectomy for invasive mammary carcinoma following radioactive seed localization.  EXAM:  SPECIMEN RADIOGRAPH OF THE RIGHT  BREAST  COMPARISON:  Previous exam(s)  FINDINGS: Status post excision of the right breast. The radioactive seed, mass and biopsy marker clip are present and are marked for pathology.  IMPRESSION: Specimen radiograph of the right breast.   Electronically Signed   By: Enrique Sack M.D.   On: 02/08/2014 14:54   Dg Chest Port 1 View  02/08/2014   CLINICAL DATA:  Breast carcinoma and status post Port-A-Cath placement.  EXAM: PORTABLE CHEST - 1 VIEW  COMPARISON:  09/02/2005  FINDINGS: Left subclavian Port-A-Cath has been placed with the catheter tip in the upper SVC. No pneumothorax. The heart is mildly enlarged. No pleural fluid. Bibasilar atelectasis present.  IMPRESSION: No pneumothorax or other acute findings after Port-A-Cath placement. The catheter tip is in the upper SVC.   Electronically Signed   By: Aletta Edouard M.D.   On: 02/08/2014 16:23   Dg Fluoro Guide Cv Line-no Report  02/08/2014   CLINICAL DATA: Port Insertion [facility] OR 8   FLOURO GUIDE CV LINE  Fluoroscopy was utilized by the requesting physician.  No radiographic  interpretation.    Mm Rt Plc Breast Loc Dev   1st Lesion  Inc Mammo Guide  02/02/2014   CLINICAL DATA:  Patient with recent diagnosis right breast invasive carcinoma.  EXAM: MAMMOGRAPHIC GUIDED RADIOACTIVE SEED LOCALIZATION OF THE RIGHT BREAST  COMPARISON:  Previous exam(s)  FINDINGS: Patient presents for radioactive seed localization prior to right breast lumpectomy. I met with the patient and we discussed the procedure of seed localization including benefits and alternatives. We discussed the high likelihood of a successful procedure. We discussed the risks of the procedure including infection, bleeding, tissue injury and further surgery. We discussed the low dose of radioactivity involved in the procedure. Informed, written consent was given.  The usual time-out protocol was performed immediately prior to the procedure.  Using mammographic guidance,  sterile technique, 2% lidocaine and an I-125 radioactive seed, biopsy marking clip in right breast mass was localized using a lateral approach. The follow-up mammogram images confirm the seed in the expected location and are marked for Dr. Donne Hazel.  Follow-up survey of the patient confirms presents of radioactive seed, biopsy marking clip and mass.  Order number of I-125 seed:  [id number].  Dose of I-125 seed:  0.250 mCi.  The patient tolerated the procedure well and was released from the [facility]. She was given instructions regarding seed removal.  IMPRESSION: Radioactive seed localization right breast. No apparent complications.   Electronically Signed   By: Lovey Newcomer M.D.   On: 02/02/2014 15:11   ASSESSMENT: 58 y.o. BRCA negative [name] woman status post right breast biopsy 12/29/2013 for a clinical T2 N1, stage IIB invasive ductal carcinoma, grade 3, triple negative, with an MIB-1 of 80%.  (1) status post right lumpectomy and sentinel lymph node sampling 02/08/2014 for a pT2 pN0, stage IIA invasive ductal carcinoma, grade 3, repeat prognostic panel again triple negative  (2) to start adjuvant chemotherapy 03/02/2014, initially doxorubicin and cyclophosphamide in dose dense fashion x4 with Neulasta day 2, to be followed by carboplatin and paclitaxel weekly x12  (3) adjuvant radiation to follow chemotherapy  (4) status post remote TAH BSO   (5) Status post removal of a pituitary adenoma 09/05/2005, no malignancy identified  (6) Uncontrolled hypertension  (7) Anemia  PLAN:  [name] is doing well today.  She is subsequently neutropenic following chemotherapy.  I prescribed Cipro BID for her to take for the next 7 days.  I gave  her detailed neutropenic instructions in her AVS and reviewed these with her in detail.  Since her [place] is 0.0, Dr. Jana Hakim will dose reduce cycle 4 of Doxorubicin and cyclophosphamide by 15%.    I prescribed Fluconazole daily x 7 days for vaginal candida.     We reviewed that she does not receive Dexamethasone with her treatment and how to take her anti-emetics.  She does want to go out of town after she receives her treatment next week, meets with Dr. Jana Hakim and has her injection.  I told her that this would likely be okay.  I told her we would likely go ahead and prescribe Cipro for her to have on hand while she is out of town and I reviewed her anti-emetic regimen with her in detail.    Her blood glucose is 348 today.  She will receive IV fluids and 6 units of regular insulin.  We again discussed her diet and compliance with her diet.  She has yet to get a working glucometer like we discussed, she is drinking sodas and fruit and not following a diabetic diet, and she also conveys that she missed her oral antihyperglycemics this morning.  I gave her a handout on diabetes and food and we faxed these labs to her PCP.  She is no longer receiving Dexamethasone.  I urged her to drink diet sodas.    [name] will return in one week for labs, evaluation and cycle 4 of Doxorubicin and Cyclophosphamide.  She will see Susanne Borders, NP in conjuction with Dr. Jana Hakim on both 8/6 and 8/20 per GM.  This request was placed on the POF.    I spent 25 minutes counseling the patient face to face.  The total time spent in the appointment was 30 minutes.  Minette Headland, [place] [phone] 04/07/2014 7:28 PM

## 2014-04-07 NOTE — Patient Instructions (Signed)
Patient Neutropenia Instruction Sheet  Diagnosis: Breast Cancer      Treating Physician: Dr. Jana Hakim  Treatment: 1. Type of chemotherapy: Doxorubicin and Cyclophosphamide 2. Date of last treatment: 03/30/14  Last Blood Counts: Lab Results  Component Value Date   WBC 0.4* 04/07/2014   HGB 8.4* 04/07/2014   HCT 25.9* 04/07/2014   MCV 79.7 04/07/2014   PLT 148 04/07/2014  ANC 0     Prophylactic Antibiotics: Cipro 500 mg by mouth twice a day Instructions: 1. Monitor temperature and call if fever  greater than 100.5, chills, shaking chills (rigors) 2. Call Physician on-call at [phone] 3. Give him/her symptoms and list of medications that you are taking and your last blood count.       Diabetes Mellitus and Food It is important for you to manage your blood sugar (glucose) level. Your blood glucose level can be greatly affected by what you eat. Eating healthier foods in the appropriate amounts throughout the day at about the same time each day will help you control your blood glucose level. It can also help slow or prevent worsening of your diabetes mellitus. Healthy eating may even help you improve the level of your blood pressure and reach or maintain a healthy weight.  HOW CAN FOOD AFFECT ME? Carbohydrates Carbohydrates affect your blood glucose level more than any other type of food. Your dietitian will help you determine how many carbohydrates to eat at each meal and teach you how to count carbohydrates. Counting carbohydrates is important to keep your blood glucose at a healthy level, especially if you are using insulin or taking certain medicines for diabetes mellitus. Alcohol Alcohol can cause sudden decreases in blood glucose (hypoglycemia), especially if you use insulin or take certain medicines for diabetes mellitus. Hypoglycemia can be a life-threatening condition. Symptoms of hypoglycemia (sleepiness, dizziness, and disorientation) are similar to symptoms of having too  much alcohol.  If your health care provider has given you approval to drink alcohol, do so in moderation and use the following guidelines:  Women should not have more than one drink per day, and men should not have more than two drinks per day. One drink is equal to:  12 oz of beer.  5 oz of wine.  1 oz of hard liquor.  Do not drink on an empty stomach.  Keep yourself hydrated. Have water, diet soda, or unsweetened iced tea.  Regular soda, juice, and other mixers might contain a lot of carbohydrates and should be counted. WHAT FOODS ARE NOT RECOMMENDED? As you make food choices, it is important to remember that all foods are not the same. Some foods have fewer nutrients per serving than other foods, even though they might have the same number of calories or carbohydrates. It is difficult to get your body what it needs when you eat foods with fewer nutrients. Examples of foods that you should avoid that are high in calories and carbohydrates but low in nutrients include:  Trans fats (most processed foods list trans fats on the Nutrition Facts label).  Regular soda.  Juice.  Candy.  Sweets, such as cake, pie, doughnuts, and cookies.  Fried foods. WHAT FOODS CAN I EAT? Have nutrient-rich foods, which will nourish your body and keep you healthy. The food you should eat also will depend on several factors, including:  The calories you need.  The medicines you take.  Your weight.  Your blood glucose level.  Your blood pressure level.  Your cholesterol level. You also should  eat a variety of foods, including:  Protein, such as meat, poultry, fish, tofu, nuts, and seeds (lean animal proteins are best).  Fruits.  Vegetables.  Dairy products, such as milk, cheese, and yogurt (low fat is best).  Breads, grains, pasta, cereal, rice, and beans.  Fats such as olive oil, trans fat-free margarine, canola oil, avocado, and olives. DOES EVERYONE WITH DIABETES MELLITUS HAVE THE  SAME MEAL PLAN? Because every person with diabetes mellitus is different, there is not one meal plan that works for everyone. It is very important that you meet with a dietitian who will help you create a meal plan that is just right for you. Document Released: 05/22/2005 Document Revised: 08/30/2013 Document Reviewed: 07/22/2013 [facility] Patient Information 2015 [place], [place]. This information is not intended to replace advice given to you by your health care provider. Make sure you discuss any questions you have with your health care provider.

## 2014-04-07 NOTE — Telephone Encounter (Signed)
Per POF staff phone call scheduled appts. Advised schedulers 

## 2014-04-07 NOTE — Telephone Encounter (Signed)
cld & spoke w/pt to adv of upcoming appts-pt understood-stated she will go to [facility] CHART to review sch

## 2014-04-07 NOTE — Progress Notes (Signed)
1105-Repeat CBG 353.  Regular insulin 6 units SQ given at 1017 per order.  Pt states that she did not take her Diabeta and Glucophage this morning.  Lisabeth Register NP notified of repeat CBG.  Order received to discharge pt to home and to instruct her to take her Diabeta and Glucophage as soon as she gets home.  Pt also instructed to pick up Cipro prescription and start taking as soon as possible as well.  Pt verbalizes an understanding on importance of taking Diabeta, Glucophage and Cipro.  Teach back done.  Pt also instructed on importance of keeping up with her glucose at home.  Pt verbalizes an understanding to do so.  Teach back done.  Neutropenic precautions again reviewed with patient.  Teach back done.

## 2014-04-10 ENCOUNTER — Ambulatory Visit: Payer: BC Managed Care – PPO | Attending: General Surgery | Admitting: Physical Therapy

## 2014-04-10 DIAGNOSIS — IMO0001 Reserved for inherently not codable concepts without codable children: Secondary | ICD-10-CM | POA: Diagnosis not present

## 2014-04-10 DIAGNOSIS — M24519 Contracture, unspecified shoulder: Secondary | ICD-10-CM | POA: Insufficient documentation

## 2014-04-11 ENCOUNTER — Telehealth: Payer: Self-pay | Admitting: Adult Health

## 2014-04-11 ENCOUNTER — Other Ambulatory Visit: Payer: Self-pay

## 2014-04-11 NOTE — Telephone Encounter (Signed)
Returned pt call/adv pt of next sch appt & inj dates & times-

## 2014-04-13 ENCOUNTER — Other Ambulatory Visit: Payer: Self-pay | Admitting: Oncology

## 2014-04-13 ENCOUNTER — Other Ambulatory Visit: Payer: BC Managed Care – PPO

## 2014-04-13 ENCOUNTER — Ambulatory Visit: Payer: BC Managed Care – PPO | Admitting: Oncology

## 2014-04-13 ENCOUNTER — Ambulatory Visit (HOSPITAL_BASED_OUTPATIENT_CLINIC_OR_DEPARTMENT_OTHER): Payer: BC Managed Care – PPO | Admitting: Nurse Practitioner

## 2014-04-13 ENCOUNTER — Ambulatory Visit: Payer: BC Managed Care – PPO

## 2014-04-13 ENCOUNTER — Other Ambulatory Visit: Payer: Self-pay | Admitting: *Deleted

## 2014-04-13 ENCOUNTER — Ambulatory Visit (HOSPITAL_BASED_OUTPATIENT_CLINIC_OR_DEPARTMENT_OTHER): Payer: BC Managed Care – PPO

## 2014-04-13 ENCOUNTER — Other Ambulatory Visit (HOSPITAL_BASED_OUTPATIENT_CLINIC_OR_DEPARTMENT_OTHER): Payer: BC Managed Care – PPO

## 2014-04-13 VITALS — BP 160/75 | HR 109 | Temp 98.3°F | Resp 18 | Ht 65.0 in | Wt 251.2 lb

## 2014-04-13 DIAGNOSIS — Z171 Estrogen receptor negative status [ER-]: Secondary | ICD-10-CM

## 2014-04-13 DIAGNOSIS — C50511 Malignant neoplasm of lower-outer quadrant of right female breast: Secondary | ICD-10-CM

## 2014-04-13 DIAGNOSIS — Z5111 Encounter for antineoplastic chemotherapy: Secondary | ICD-10-CM

## 2014-04-13 DIAGNOSIS — E118 Type 2 diabetes mellitus with unspecified complications: Secondary | ICD-10-CM

## 2014-04-13 DIAGNOSIS — I10 Essential (primary) hypertension: Secondary | ICD-10-CM

## 2014-04-13 DIAGNOSIS — D649 Anemia, unspecified: Secondary | ICD-10-CM

## 2014-04-13 DIAGNOSIS — E119 Type 2 diabetes mellitus without complications: Secondary | ICD-10-CM

## 2014-04-13 DIAGNOSIS — C50919 Malignant neoplasm of unspecified site of unspecified female breast: Secondary | ICD-10-CM

## 2014-04-13 DIAGNOSIS — D702 Other drug-induced agranulocytosis: Secondary | ICD-10-CM

## 2014-04-13 LAB — CBC WITH DIFFERENTIAL/PLATELET
BASO%: 0.4 % (ref 0.0–2.0)
Basophils Absolute: 0 10*3/uL (ref 0.0–0.1)
EOS%: 0.2 % (ref 0.0–7.0)
Eosinophils Absolute: 0 10*3/uL (ref 0.0–0.5)
HCT: 27 % — ABNORMAL LOW (ref 34.8–46.6)
HGB: 8.6 g/dL — ABNORMAL LOW (ref 11.6–15.9)
LYMPH%: 6.6 % — AB (ref 14.0–49.7)
MCH: 25.9 pg (ref 25.1–34.0)
MCHC: 31.8 g/dL (ref 31.5–36.0)
MCV: 81.4 fL (ref 79.5–101.0)
MONO#: 1.1 10*3/uL — ABNORMAL HIGH (ref 0.1–0.9)
MONO%: 11.6 % (ref 0.0–14.0)
NEUT#: 8 10*3/uL — ABNORMAL HIGH (ref 1.5–6.5)
NEUT%: 81.2 % — ABNORMAL HIGH (ref 38.4–76.8)
PLATELETS: 282 10*3/uL (ref 145–400)
RBC: 3.31 10*6/uL — ABNORMAL LOW (ref 3.70–5.45)
RDW: 16.4 % — ABNORMAL HIGH (ref 11.2–14.5)
WBC: 9.9 10*3/uL (ref 3.9–10.3)
lymph#: 0.7 10*3/uL — ABNORMAL LOW (ref 0.9–3.3)

## 2014-04-13 LAB — COMPREHENSIVE METABOLIC PANEL (CC13)
ALK PHOS: 165 U/L — AB (ref 40–150)
ALT: 20 U/L (ref 0–55)
AST: 20 U/L (ref 5–34)
Albumin: 3.7 g/dL (ref 3.5–5.0)
Anion Gap: 11 mEq/L (ref 3–11)
BUN: 23.8 mg/dL (ref 7.0–26.0)
CO2: 21 mEq/L — ABNORMAL LOW (ref 22–29)
CREATININE: 1.3 mg/dL — AB (ref 0.6–1.1)
Calcium: 9.5 mg/dL (ref 8.4–10.4)
Chloride: 105 mEq/L (ref 98–109)
Glucose: 183 mg/dl — ABNORMAL HIGH (ref 70–140)
Potassium: 4 mEq/L (ref 3.5–5.1)
SODIUM: 138 meq/L (ref 136–145)
TOTAL PROTEIN: 7.2 g/dL (ref 6.4–8.3)
Total Bilirubin: 0.34 mg/dL (ref 0.20–1.20)

## 2014-04-13 MED ORDER — PALONOSETRON HCL INJECTION 0.25 MG/5ML
0.2500 mg | Freq: Once | INTRAVENOUS | Status: AC
  Administered 2014-04-13: 0.25 mg via INTRAVENOUS

## 2014-04-13 MED ORDER — PROCHLORPERAZINE EDISYLATE 5 MG/ML IJ SOLN
10.0000 mg | Freq: Once | INTRAMUSCULAR | Status: AC
  Administered 2014-04-13: 10 mg via INTRAVENOUS

## 2014-04-13 MED ORDER — SODIUM CHLORIDE 0.9 % IV SOLN
Freq: Once | INTRAVENOUS | Status: AC
  Administered 2014-04-13: 15:00:00 via INTRAVENOUS

## 2014-04-13 MED ORDER — CIPROFLOXACIN HCL 250 MG PO TABS: 250.0000 mg | ORAL_TABLET | Freq: Two times a day (BID) | ORAL | Status: DC

## 2014-04-13 MED ORDER — SODIUM CHLORIDE 0.9 % IJ SOLN
10.0000 mL | INTRAMUSCULAR | Status: DC | PRN
  Administered 2014-04-13: 10 mL
  Filled 2014-04-13: qty 10

## 2014-04-13 MED ORDER — HEPARIN SOD (PORK) LOCK FLUSH 100 UNIT/ML IV SOLN
500.0000 [IU] | Freq: Once | INTRAVENOUS | Status: AC | PRN
  Administered 2014-04-13: 500 [IU]
  Filled 2014-04-13: qty 5

## 2014-04-13 MED ORDER — DOXORUBICIN HCL CHEMO IV INJECTION 2 MG/ML
51.0000 mg/m2 | Freq: Once | INTRAVENOUS | Status: AC
  Administered 2014-04-13: 116 mg via INTRAVENOUS
  Filled 2014-04-13: qty 58

## 2014-04-13 MED ORDER — PALONOSETRON HCL INJECTION 0.25 MG/5ML
INTRAVENOUS | Status: AC
  Filled 2014-04-13: qty 5

## 2014-04-13 MED ORDER — PROCHLORPERAZINE EDISYLATE 5 MG/ML IJ SOLN
INTRAMUSCULAR | Status: AC
  Filled 2014-04-13: qty 2

## 2014-04-13 MED ORDER — SODIUM CHLORIDE 0.9 % IV SOLN
150.0000 mg | Freq: Once | INTRAVENOUS | Status: AC
  Administered 2014-04-13: 150 mg via INTRAVENOUS
  Filled 2014-04-13: qty 5

## 2014-04-13 MED ORDER — SODIUM CHLORIDE 0.9 % IV SOLN
510.0000 mg/m2 | Freq: Once | INTRAVENOUS | Status: AC
  Administered 2014-04-13: 1160 mg via INTRAVENOUS
  Filled 2014-04-13: qty 58

## 2014-04-13 NOTE — Progress Notes (Signed)
[facility]  [phone] Fax:([phone]     ID: Beryle Lathe OB: [birth year]  MR#: [id number]  FMZ#:[id number]  PCP: Eartha Inch, MD GYN:   SU: Emelia Loron OTHER MD:  CHIEF COMPLAINT: Triple negative breast cancer  CURRENT TREATMENT: Adjuvant chemotherapy  BREAST CANCER HISTORY: From the original intake note 01/18/2014:  The patient had routine bilateral screening mammography at the breast Center for 10/28/2013 showing a possible mass in the right breast. Additional views and right breast ultrasonography 12/20/2013 confirmed an irregular 1.3 cm right breast mass at the 6:00 position. This was not palpable. Ultrasound showed an irregular hypoechoic mass in the right breast measuring 1.4 cm. There was no right axillary adenopathy identified.  On 12/29/2013 the patient underwent right breast biopsy, showing ([id number]) and invasive ductal carcinoma, grade 3, estrogen and progesterone receptor negative, HER-2 not amplified with a signals ratio of 1.35 and a copy number per cell of 2.10, and an MIB-1 of 80%.  On 01/12/2014 the patient underwent bilateral breast MRI. This showed the mass in the right breast lower outer quadrant to measure 2.2 cm maximally. There was a 2 cm well circumscribed mass in the medial right breast which has been stable since 2011 him a and a 1 cm oval mass in the upper midline of the right breast, likely an intramammary lymph node, with benign features. A right axillary lymph node with cortical thickening measured 0.9 cm. The fatty hilum was possibly effaced; note Right axilla had been benign by [place] before biopsy. In the left breast there were no findings of concern.  The patient's subsequent history is as detailed below.  INTERVAL HISTORY: Siedah is here for follow up of her breast cancer. Today is day 1, cycle 4 of 4 of doxorubicin and cyclophosphamide with neulasta given on day 2. She has done well so far with these  treatments, besides elevated blood glucoses. She required IV fluids and insulin last week and her dexamethasone doses were removed from her home med regimen and from the pretreat list with chemo. The patient states she has also made changes to diet and has cut out as many processed foods as possible. Last week her neutrophil count was very low and she was given a round of Cipro to take as a prophylactic measure. Additionally, she complained of vaginal yeast and was prescribed diflucan. She ended up not needing this dose of diflucan because the issue resolved on its own within 24 hrs.   REVIEW OF SYSTEMS: Wynelle denies fever, chills, vomiting, diarrhea, and peripheral neuropathy. She had mild nausea on day 2 and 3, but admits she had not been taking her prochlorperazine and ondansetron as recommended on the home antiemetic sheet. Her appetite is unaffected at this time. A detailed review of systems is otherwise negative.     PAST MEDICAL HISTORY: Past Medical History  Diagnosis Date  . HTN (hypertension)   . Diabetes mellitus   . High cholesterol   . Breast cancer   . Family history of malignant neoplasm of breast   . Glaucoma   . Wears glasses   . Wears dentures     full top-partial bottom    PAST SURGICAL HISTORY: Past Surgical History  Procedure Laterality Date  . Transphenoidal pituitary resection  2006  . Abdominal hysterectomy  1993  . Breast biopsy  2000    LEFT  . Tonsillectomy    . Portacath placement Left 02/08/2014    Procedure: INSERTION PORT-A-CATH;  Surgeon: Molli Hazard  Donne Hazel, MD;  Location: [place];  Service: General;  Laterality: Left;    FAMILY HISTORY Family History  Problem Relation Age of Onset  . Hypertension Mother   . Diabetes type II Mother   . Breast cancer Mother     dx ~65; deceased 48  . Hypertension Father   . Breast cancer Maternal Aunt     deceased 50  . Cancer Maternal Uncle     unk. type; deceased 50s  . Cancer Maternal Uncle      unk. type; deceased late 62s  . Uterine cancer Cousin 11    daughter of an unaffected mat aunt who is 31   the patient's father is in his early 28s. The patient's mother died at the age of 53, from complications of breast cancer which had been diagnosed "years before". The patient had 2 brothers, 2 sisters. The only other breast cancer in the family was a maternal aunt who died from breast cancer at the age of 33. (This means that 2 of 3 sisters, including the patient's mother, had breast cancer, one of them before the age of 52). There is no history of ovarian cancer in the family.  GYNECOLOGIC HISTORY:  Menarche age 71, first live birth age 61. The patient is GX P1. She underwent total abdominal hysterectomy with bilateral salpingo-oophorectomy at age 74. She took hormone replacement for one or 2 months old.  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 The patient is a widow. She lives by herself, with no pets. Her daughter Cherokee Boccio lives in [place], where she works in a Geneticist, molecular. The patient has no grandchildren. She is a Psychologist, forensic    ADVANCED DIRECTIVES: Not in place; advanced directives were discussed with the patient at her initial visit 01/18/2014. She intends to name her daughter as healthcare power of attorney. [name] can be reached at [phone]   HEALTH MAINTENANCE: History  Substance Use Topics  . Smoking status: Former Smoker    Types: Cigarettes    Quit date: 01/31/1994  . Smokeless tobacco: Not on file  . Alcohol Use: Yes     Comment: occ     Colonoscopy:  PAP: Status post hysterectomy  Bone density: Remote  Lipid panel:  No Known Allergies  Current Outpatient Prescriptions  Medication Sig Dispense Refill  . amLODipine (NORVASC) 10  MG tablet TAKE ONE TABLET BY MOUTH ONCE DAILY      . Atorvastatin Calcium (LIPITOR PO) Take 40 mg by mouth daily.       . ciprofloxacin (CIPRO) 250 MG tablet Take 1 tablet (250 mg total) by mouth 2 (two) times daily.  14 tablet  0  . dexamethasone (DECADRON) 0.1 % ophthalmic solution Place 1 drop into both eyes daily.      Marland Kitchen glyBURIDE (DIABETA) 5 MG tablet Take 5 mg by mouth daily with breakfast.       . latanoprost (XALATAN) 0.005 % ophthalmic solution Place 1 drop into both eyes at bedtime.      . lidocaine-prilocaine (EMLA) cream Apply 1 application topically as needed. Apply over port site 1-2 hours before chemo, then cover with plastic wrap  30 g  0  . lisinopril-hydrochlorothiazide (PRINZIDE,ZESTORETIC) 20-12.5 MG per tablet Take 2 tablets by mouth daily.       Marland Kitchen  LORazepam (ATIVAN) 0.5 MG tablet Take 1 tablet (0.5 mg total) by mouth at bedtime as needed (Nausea or vomiting).  30 tablet  0  . metFORMIN (GLUCOPHAGE-XR) 500 MG 24 hr tablet Take 500 mg by mouth 2 (two) times daily.       . ondansetron (ZOFRAN) 8 MG tablet Take 1 tablet (8 mg total) by mouth every 8 (eight) hours as needed for nausea or vomiting.  60 tablet  2  . tiZANidine (ZANAFLEX) 4 MG tablet 4 mg at bedtime.       . fluconazole (DIFLUCAN) 200 MG tablet Take 1 tablet (200 mg total) by mouth daily.  7 tablet  3  . oxyCODONE-acetaminophen (PERCOCET) 10-325 MG per tablet Take 1 tablet by mouth every 6 (six) hours as needed for pain.  20 tablet  0  . prochlorperazine (COMPAZINE) 10 MG tablet Take 1 tablet (10 mg total) by mouth every 6 (six) hours as needed (Nausea or vomiting).  30 tablet  1   No current facility-administered medications for this visit.   Facility-Administered Medications Ordered in Other Visits  Medication Dose Route Frequency Provider Last Rate Last Dose  . cyclophosphamide (CYTOXAN) 1,360 mg in sodium chloride 0.9 % 250 mL chemo infusion  600 mg/m2 (Treatment Plan Actual) Intravenous Once Chauncey Cruel,  MD      . DOXOrubicin (ADRIAMYCIN) chemo injection 136 mg  60 mg/m2 (Treatment Plan Actual) Intravenous Once Chauncey Cruel, MD      . fosaprepitant (EMEND) 150 mg in sodium chloride 0.9 % 145 mL IVPB  150 mg Intravenous Once Chauncey Cruel, MD      . heparin lock flush 100 unit/mL  500 Units Intracatheter Once PRN Chauncey Cruel, MD      . sodium chloride 0.9 % injection 10 mL  10 mL Intracatheter PRN Chauncey Cruel, MD        OBJECTIVE: Middle-aged [place] American woman who appears stated age. She is obese not in acute distress  Filed Vitals:   04/13/14 1423  BP: 160/75  Pulse: 109  Temp: 98.3 F (36.8 C)  Resp: 18     Body mass index is 41.8 kg/(m^2).    ECOG FS:1 - Symptomatic but completely ambulatory  Skin: warm, dry, nails painted at this time, unable to assess. HEENT: sclerae anicteric, conjunctivae pink, oropharynx clear. No thrush or mucositis. Tongue has several spots of hyperpigmentation Lymph Nodes: No cervical, axillary, or supraclavicular lymphadenopathy  Lungs: clear to auscultation bilaterally, no rales, wheezes, or rhonci  Heart: regular rate and rhythm  Abdomen: round, soft, non tender, positive bowel sounds  Musculoskeletal: No focal spinal tenderness, no peripheral edema  Neuro: non focal, well oriented, positive affect  Breast: deferred   LAB RESULTS:  CMP  I No results found for this basename: SPEP,  UPEP,   kappa and lambda light chains    Lab Results  Component Value Date   WBC 9.9 04/13/2014   NEUTROABS 8.0* 04/13/2014   HGB 8.6* 04/13/2014   HCT 27.0* 04/13/2014   MCV 81.4 04/13/2014   PLT 282 04/13/2014      Chemistry      Component Value Date/Time   NA 138 04/13/2014 1404   NA 141 02/03/2014 1400   K 4.0 04/13/2014 1404   K 4.4 02/03/2014 1400   CL 100 02/03/2014 1400   CO2 21* 04/13/2014 1404   CO2 24 02/03/2014 1400   BUN 23.8 04/13/2014 1404   BUN 20 02/03/2014 1400  CREATININE 1.3* 04/13/2014 1404   CREATININE 1.16* 02/03/2014 1400   GLU  353* 04/07/2014 1113      Component Value Date/Time   CALCIUM 9.5 04/13/2014 1404   CALCIUM 10.0 02/03/2014 1400   ALKPHOS 165* 04/13/2014 1404   ALKPHOS 114 02/03/2014 1400   AST 20 04/13/2014 1404   AST 15 02/03/2014 1400   ALT 20 04/13/2014 1404   ALT 15 02/03/2014 1400   BILITOT 0.34 04/13/2014 1404   BILITOT 0.3 02/03/2014 1400       Lab Results  Component Value Date   LABCA2 51* 02/03/2014    No components found with this basename: [id number]    No results found for this basename: INR,  in the last 168 hours  Urinalysis No results found for this basename: colorurine,  appearanceur,  labspec,  phurine,  glucoseu,  hgbur,  bilirubinur,  ketonesur,  proteinur,  urobilinogen,  nitrite,  leukocytesur    STUDIES: Nm Sentinel Node Inj-no Rpt (breast)  02/08/2014   CLINICAL DATA: right axillary sentinel node biopsy   Sulfur colloid was injected intradermally by the nuclear medicine  technologist for breast cancer sentinel node localization.    Mm Breast Surgical Specimen  02/08/2014   CLINICAL DATA:  Right lumpectomy for invasive mammary carcinoma following radioactive seed localization.  EXAM: SPECIMEN RADIOGRAPH OF THE RIGHT BREAST  COMPARISON:  Previous exam(s)  FINDINGS: Status post excision of the right breast. The radioactive seed, mass and biopsy marker clip are present and are marked for pathology.  IMPRESSION: Specimen radiograph of the right breast.   Electronically Signed   By: Enrique Sack M.D.   On: 02/08/2014 14:54   Dg Chest Port 1 View  02/08/2014   CLINICAL DATA:  Breast carcinoma and status post Port-A-Cath placement.  EXAM: PORTABLE CHEST - 1 VIEW  COMPARISON:  09/02/2005  FINDINGS: Left subclavian Port-A-Cath has been placed with the catheter tip in the upper SVC. No pneumothorax. The heart is mildly enlarged. No pleural fluid. Bibasilar atelectasis present.  IMPRESSION: No pneumothorax or other acute findings after Port-A-Cath placement. The catheter tip is in the upper SVC.    Electronically Signed   By: Aletta Edouard M.D.   On: 02/08/2014 16:23   Dg Fluoro Guide Cv Line-no Report  02/08/2014   CLINICAL DATA: Port Insertion [facility] OR 8   FLOURO GUIDE CV LINE  Fluoroscopy was utilized by the requesting physician.  No radiographic  interpretation.    Mm Rt Plc Breast Loc Dev   1st Lesion  Inc Mammo Guide  02/02/2014   CLINICAL DATA:  Patient with recent diagnosis right breast invasive carcinoma.  EXAM: MAMMOGRAPHIC GUIDED RADIOACTIVE SEED LOCALIZATION OF THE RIGHT BREAST  COMPARISON:  Previous exam(s)  FINDINGS: Patient presents for radioactive seed localization prior to right breast lumpectomy. I met with the patient and we discussed the procedure of seed localization including benefits and alternatives. We discussed the high likelihood of a successful procedure. We discussed the risks of the procedure including infection, bleeding, tissue injury and further surgery. We discussed the low dose of radioactivity involved in the procedure. Informed, written consent was given.  The usual time-out protocol was performed immediately prior to the procedure.  Using mammographic guidance, sterile technique, 2% lidocaine and an I-125 radioactive seed, biopsy marking clip in right breast mass was localized using a lateral approach. The follow-up mammogram images confirm the seed in the expected location and are marked for Dr. Donne Hazel.  Follow-up survey of the patient confirms presents  of radioactive seed, biopsy marking clip and mass.  Order number of I-125 seed:  [id number].  Dose of I-125 seed:  0.250 mCi.  The patient tolerated the procedure well and was released from the Breast Center. She was given instructions regarding seed removal.  IMPRESSION: Radioactive seed localization right breast. No apparent complications.   Electronically Signed   By: Lovey Newcomer M.D.   On: 02/02/2014 15:11   ASSESSMENT: 58 y.o. BRCA negative [place] woman status post right breast biopsy 12/29/2013 for a  clinical T2 N1, stage IIB invasive ductal carcinoma, grade 3, triple negative, with an MIB-1 of 80%.  (1) status post right lumpectomy and sentinel lymph node sampling 02/08/2014 for a pT2 pN0, stage IIA invasive ductal carcinoma, grade 3, repeat prognostic panel again triple negative  (2) to start adjuvant chemotherapy 03/02/2014, initially doxorubicin and cyclophosphamide in dose dense fashion x4 with Neulasta day 2, to be followed by carboplatin and paclitaxel weekly x12  (3) adjuvant radiation to follow chemotherapy  (4) status post remote TAH BSO   (5) Status post removal of a pituitary adenoma 09/05/2005, no malignancy identified  (6) Uncontrolled hypertension  (7) Anemia  PLAN: Dreamer is doing well with her treatments. Her labwork was reviewed in detail. Her blood glucose dropped to 183 today. She has been referred to nutrition/diabetic counseling, further teaching, and possibly help obtaining a working glucometer.    Margree's ANC was much improved. We reviewed her anti-emetic schedule and she ensured me she will do a better job of following this. We will proceed with Day 1 cycle 4 of 4 today.   The patient will be headed out of town next week. A prescription for cipro x 5 days to start next Thursday will be sent to her pharmacy as we will not be able to monitor her [place].   In 2 weeks she will begin carboplatin and paclitaxel weekly x12 weeks. Dr. Jana Hakim was present and explained to the patient the most important side effects to watch out for, especially peripheral neuropathy.  Yuki understands and agrees with this plan. She has been encouraged to call with any issues that arise before her next visit here.   Marcelino Duster, NP  04/13/2014 3:26 PM

## 2014-04-13 NOTE — Patient Instructions (Signed)
[facility] Discharge Instructions for Patients Receiving Chemotherapy  Today you received the following chemotherapy agents Adriamycin/Cytoxan To help prevent nausea and vomiting after your treatment, we encourage you to take your nausea medication as prescribed. If you develop nausea and vomiting that is not controlled by your nausea medication, call the clinic.   BELOW ARE SYMPTOMS THAT SHOULD BE REPORTED IMMEDIATELY:  *FEVER GREATER THAN 100.5 F  *CHILLS WITH OR WITHOUT FEVER  NAUSEA AND VOMITING THAT IS NOT CONTROLLED WITH YOUR NAUSEA MEDICATION  *UNUSUAL SHORTNESS OF BREATH  *UNUSUAL BRUISING OR BLEEDING  TENDERNESS IN MOUTH AND THROAT WITH OR WITHOUT PRESENCE OF ULCERS  *URINARY PROBLEMS  *BOWEL PROBLEMS  UNUSUAL RASH Items with * indicate a potential emergency and should be followed up as soon as possible.  Feel free to call the clinic you have any questions or concerns. The clinic phone number is [phone].    

## 2014-04-14 ENCOUNTER — Ambulatory Visit: Payer: BC Managed Care – PPO

## 2014-04-14 ENCOUNTER — Encounter: Payer: Self-pay | Admitting: Nurse Practitioner

## 2014-04-14 ENCOUNTER — Other Ambulatory Visit: Payer: BC Managed Care – PPO

## 2014-04-14 ENCOUNTER — Ambulatory Visit (HOSPITAL_BASED_OUTPATIENT_CLINIC_OR_DEPARTMENT_OTHER): Payer: BC Managed Care – PPO

## 2014-04-14 ENCOUNTER — Ambulatory Visit: Payer: BC Managed Care – PPO | Admitting: Oncology

## 2014-04-14 VITALS — BP 169/69 | HR 106 | Temp 98.2°F

## 2014-04-14 DIAGNOSIS — C50511 Malignant neoplasm of lower-outer quadrant of right female breast: Secondary | ICD-10-CM

## 2014-04-14 DIAGNOSIS — C50919 Malignant neoplasm of unspecified site of unspecified female breast: Secondary | ICD-10-CM

## 2014-04-14 DIAGNOSIS — Z5189 Encounter for other specified aftercare: Secondary | ICD-10-CM

## 2014-04-14 MED ORDER — PEGFILGRASTIM INJECTION 6 MG/0.6ML
6.0000 mg | Freq: Once | SUBCUTANEOUS | Status: AC
  Administered 2014-04-14: 6 mg via SUBCUTANEOUS
  Filled 2014-04-14: qty 0.6

## 2014-04-27 ENCOUNTER — Other Ambulatory Visit (HOSPITAL_BASED_OUTPATIENT_CLINIC_OR_DEPARTMENT_OTHER): Payer: BC Managed Care – PPO

## 2014-04-27 ENCOUNTER — Ambulatory Visit: Payer: BC Managed Care – PPO

## 2014-04-27 ENCOUNTER — Telehealth: Payer: Self-pay | Admitting: Nurse Practitioner

## 2014-04-27 ENCOUNTER — Ambulatory Visit (HOSPITAL_BASED_OUTPATIENT_CLINIC_OR_DEPARTMENT_OTHER): Payer: BC Managed Care – PPO | Admitting: Nurse Practitioner

## 2014-04-27 ENCOUNTER — Encounter: Payer: Self-pay | Admitting: Nurse Practitioner

## 2014-04-27 ENCOUNTER — Ambulatory Visit: Payer: BC Managed Care – PPO | Admitting: Nurse Practitioner

## 2014-04-27 ENCOUNTER — Ambulatory Visit (HOSPITAL_COMMUNITY)
Admission: RE | Admit: 2014-04-27 | Discharge: 2014-04-27 | Disposition: A | Discharge status: Home / Self Care | Payer: BC Managed Care – PPO | Source: Ambulatory Visit | Attending: Oncology | Admitting: Oncology

## 2014-04-27 VITALS — BP 172/80 | HR 112 | Temp 98.4°F | Resp 20 | Ht 65.0 in | Wt 252.7 lb

## 2014-04-27 DIAGNOSIS — T451X5A Adverse effect of antineoplastic and immunosuppressive drugs, initial encounter: Secondary | ICD-10-CM

## 2014-04-27 DIAGNOSIS — D6481 Anemia due to antineoplastic chemotherapy: Secondary | ICD-10-CM

## 2014-04-27 DIAGNOSIS — D649 Anemia, unspecified: Secondary | ICD-10-CM

## 2014-04-27 DIAGNOSIS — C50511 Malignant neoplasm of lower-outer quadrant of right female breast: Secondary | ICD-10-CM

## 2014-04-27 DIAGNOSIS — C50919 Malignant neoplasm of unspecified site of unspecified female breast: Secondary | ICD-10-CM

## 2014-04-27 DIAGNOSIS — C50519 Malignant neoplasm of lower-outer quadrant of unspecified female breast: Secondary | ICD-10-CM | POA: Diagnosis not present

## 2014-04-27 DIAGNOSIS — I10 Essential (primary) hypertension: Secondary | ICD-10-CM

## 2014-04-27 DIAGNOSIS — E118 Type 2 diabetes mellitus with unspecified complications: Secondary | ICD-10-CM

## 2014-04-27 LAB — COMPREHENSIVE METABOLIC PANEL (CC13)
ALT: 16 U/L (ref 0–55)
AST: 15 U/L (ref 5–34)
Albumin: 3.8 g/dL (ref 3.5–5.0)
Alkaline Phosphatase: 181 U/L — ABNORMAL HIGH (ref 40–150)
Anion Gap: 12 mEq/L — ABNORMAL HIGH (ref 3–11)
BUN: 20.9 mg/dL (ref 7.0–26.0)
CALCIUM: 9.3 mg/dL (ref 8.4–10.4)
CHLORIDE: 107 meq/L (ref 98–109)
CO2: 19 mEq/L — ABNORMAL LOW (ref 22–29)
CREATININE: 1.3 mg/dL — AB (ref 0.6–1.1)
Glucose: 304 mg/dl — ABNORMAL HIGH (ref 70–140)
Potassium: 4.2 mEq/L (ref 3.5–5.1)
Sodium: 137 mEq/L (ref 136–145)
Total Protein: 7.2 g/dL (ref 6.4–8.3)

## 2014-04-27 LAB — CBC WITH DIFFERENTIAL/PLATELET
BASO%: 0.5 % (ref 0.0–2.0)
Basophils Absolute: 0.1 10*3/uL (ref 0.0–0.1)
EOS%: 0.2 % (ref 0.0–7.0)
Eosinophils Absolute: 0 10*3/uL (ref 0.0–0.5)
HEMATOCRIT: 24.3 % — AB (ref 34.8–46.6)
HGB: 7.5 g/dL — ABNORMAL LOW (ref 11.6–15.9)
LYMPH%: 5.1 % — ABNORMAL LOW (ref 14.0–49.7)
MCH: 25.6 pg (ref 25.1–34.0)
MCHC: 31 g/dL — ABNORMAL LOW (ref 31.5–36.0)
MCV: 82.6 fL (ref 79.5–101.0)
MONO#: 1.4 10*3/uL — ABNORMAL HIGH (ref 0.1–0.9)
MONO%: 11.4 % (ref 0.0–14.0)
NEUT#: 10.5 10*3/uL — ABNORMAL HIGH (ref 1.5–6.5)
NEUT%: 82.8 % — AB (ref 38.4–76.8)
Platelets: 164 10*3/uL (ref 145–400)
RBC: 2.94 10*6/uL — ABNORMAL LOW (ref 3.70–5.45)
RDW: 17.6 % — ABNORMAL HIGH (ref 11.2–14.5)
WBC: 12.7 10*3/uL — ABNORMAL HIGH (ref 3.9–10.3)
lymph#: 0.6 10*3/uL — ABNORMAL LOW (ref 0.9–3.3)

## 2014-04-27 LAB — ABO/RH: ABO/RH(D): A POS

## 2014-04-27 LAB — PREPARE RBC (CROSSMATCH)

## 2014-04-27 NOTE — Telephone Encounter (Signed)
, °

## 2014-04-27 NOTE — Progress Notes (Signed)
[facility]  Telephone:(336) [phone] [phone]     ID: [name] OB: [birth year]  MR#: [id number]  OZY#:[id number]  PCP: Chesley Noon, MD GYN:   SU: Rolm Bookbinder OTHER MD:  CHIEF COMPLAINT: Triple negative breast cancer  CURRENT TREATMENT: Adjuvant chemotherapy  BREAST CANCER HISTORY: From the original intake note 01/18/2014:  The patient had routine bilateral screening mammography at the breast Center for 10/28/2013 showing a possible mass in the right breast. Additional views and right breast ultrasonography 12/20/2013 confirmed an irregular 1.3 cm right breast mass at the 6:00 position. This was not palpable. Ultrasound showed an irregular hypoechoic mass in the right breast measuring 1.4 cm. There was no right axillary adenopathy identified.  On 12/29/2013 the patient underwent right breast biopsy, showing ([id number]) and invasive ductal carcinoma, grade 3, estrogen and progesterone receptor negative, HER-2 not amplified with a signals ratio of 1.35 and a copy number per cell of 2.10, and an MIB-1 of 80%.  On 01/12/2014 the patient underwent bilateral breast MRI. This showed the mass in the right breast lower outer quadrant to measure 2.2 cm maximally. There was a 2 cm well circumscribed mass in the medial right breast which has been stable since 2011 him a and a 1 cm oval mass in the upper midline of the right breast, likely an intramammary lymph node, with benign features. A right axillary lymph node with cortical thickening measured 0.9 cm. The fatty hilum was possibly effaced; note Right axilla had been benign by [place] before biopsy. In the left breast there were no findings of concern.  The patient's subsequent history is as detailed below.  INTERVAL HISTORY: [name] is here for follow up of her breast cancer. Today is day 1, cycle 1 of 12 weekly planned paclitaxel doses. She was on vacation last week during the nadir from her final dose of  cyclophosphamide and doxorubicin.  REVIEW OF SYSTEMS: [name] denies fever, chills, vomiting, diarrhea, constipation, mouth sores, and peripheral neuropathy. She is a little fatigued, but no more than usual. She denies shortness of breath, cough, chest pain, and weakness. A detailed review of systems is otherwise negative.   PAST MEDICAL HISTORY: Past Medical History  Diagnosis Date  . HTN (hypertension)   . Diabetes mellitus   . High cholesterol   . Breast cancer   . Family history of malignant neoplasm of breast   . Glaucoma   . Wears glasses   . Wears dentures     full top-partial bottom    PAST SURGICAL HISTORY: Past Surgical History  Procedure Laterality Date  . Transphenoidal pituitary resection  2006  . Abdominal hysterectomy  1993  . Breast biopsy  2000    LEFT  . Tonsillectomy    . Portacath placement Left 02/08/2014    Procedure: INSERTION PORT-A-CATH;  Surgeon: Rolm Bookbinder, MD;  Location: [facility];  Service: General;  Laterality: Left;    FAMILY HISTORY Family History  Problem Relation Age of Onset  . Hypertension Mother   . Diabetes type II Mother   . Breast cancer Mother     dx ~6; deceased 34  . Hypertension Father   . Breast cancer Maternal Aunt     deceased 36  . Cancer Maternal Uncle     unk. type; deceased 60s  . Cancer Maternal Uncle     unk. type; deceased late 87s  . Uterine cancer Cousin 57    daughter of an unaffected mat aunt who is  67   the patient's father is in his early 9s. The patient's mother died at the age of 80, from complications of breast cancer which had been diagnosed "years before". The patient had 2 brothers, 2 sisters. The only other breast cancer in the family was a maternal aunt who died from breast cancer at the age of 81. (This means that 2 of 3 sisters, including the patient's mother, had breast cancer, one of them before the age of 30). There is no history of ovarian cancer in the  family.  GYNECOLOGIC HISTORY:  Menarche age 37, first live birth age 20. The patient is GX P1. She underwent total abdominal hysterectomy with bilateral salpingo-oophorectomy at age 46. She took hormone replacement for one or 2 months old.  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 The patient is a widow. She lives by herself, with no pets. Her daughter [name] lives in [place], where she works in a Geneticist, molecular. The patient has no grandchildren. She is a Psychologist, forensic    ADVANCED DIRECTIVES: Not in place; advanced directives were discussed with the patient at her initial visit 01/18/2014. She intends to name her daughter as healthcare power of attorney. [name] can be reached at [phone]   HEALTH MAINTENANCE: History  Substance Use Topics  . Smoking status: Former Smoker    Types: Cigarettes    Quit date: 01/31/1994  . Smokeless tobacco: Not on file  . Alcohol Use: Yes     Comment: occ     Colonoscopy:  PAP: Status post hysterectomy  Bone density: Remote  Lipid panel:  No Known Allergies  Current Outpatient Prescriptions  Medication Sig Dispense Refill  . amLODipine (NORVASC) 10 MG tablet TAKE ONE TABLET BY MOUTH ONCE DAILY      . Atorvastatin Calcium (LIPITOR PO) Take 40 mg by mouth daily.       . ciprofloxacin (CIPRO) 250 MG tablet Take 1 tablet (250 mg total) by mouth 2 (two) times daily.  10 tablet  0  . dexamethasone (DECADRON) 0.1 % ophthalmic solution Place 1 drop into both eyes daily.      . fluconazole (DIFLUCAN) 200 MG tablet Take 1 tablet (200 mg total) by mouth daily.  7 tablet  3  . glyBURIDE (DIABETA) 5 MG tablet Take 5 mg by mouth daily with breakfast.       . latanoprost (XALATAN) 0.005 % ophthalmic solution Place 1 drop into both eyes  at bedtime.      Marland Kitchen lisinopril-hydrochlorothiazide (PRINZIDE,ZESTORETIC) 20-12.5 MG per tablet Take 2 tablets by mouth daily.       Marland Kitchen LORazepam (ATIVAN) 0.5 MG tablet Take 1 tablet (0.5 mg total) by mouth at bedtime as needed (Nausea or vomiting).  30 tablet  0  . metFORMIN (GLUCOPHAGE-XR) 500 MG 24 hr tablet Take 500 mg by mouth 2 (two) times daily.       . ondansetron (ZOFRAN) 8 MG tablet Take 1 tablet (8 mg total) by mouth every 8 (eight) hours as needed for nausea or vomiting.  60 tablet  2  . oxyCODONE-acetaminophen (PERCOCET) 10-325 MG per tablet Take 1 tablet by mouth every 6 (six) hours as needed for pain.  20 tablet  0  . tiZANidine (ZANAFLEX) 4 MG tablet 4 mg at bedtime.  No current facility-administered medications for this visit.    OBJECTIVE: Middle-aged [place] American woman who appears stated age. She is obese not in acute distress  Filed Vitals:   04/27/14 1444  BP: 172/80  Pulse: 112  Temp: 98.4 F (36.9 C)  Resp: 20     Body mass index is 42.05 kg/(m^2).    ECOG FS:1 - Symptomatic but completely ambulatory  Sclerae unicteric, pupils equal and reactive Oropharynx clear and moist-- no thrush No cervical or supraclavicular adenopathy Lungs no rales or rhonchi Heart regular rate and rhythm Abd soft, nontender, positive bowel sounds MSK no focal spinal tenderness, no upper extremity lymphedema Neuro: nonfocal, well oriented, appropriate affect Breasts: deferred  LAB RESULTS:  CMP  I No results found for this basename: SPEP,  UPEP,   kappa and lambda light chains    Lab Results  Component Value Date   WBC 12.7* 04/27/2014   NEUTROABS 10.5* 04/27/2014   HGB 7.5* 04/27/2014   HCT 24.3* 04/27/2014   MCV 82.6 04/27/2014   PLT 164 04/27/2014      Chemistry      Component Value Date/Time   NA 137 04/27/2014 1348   NA 141 02/03/2014 1400   K 4.2 04/27/2014 1348   K 4.4 02/03/2014 1400   CL 100 02/03/2014 1400   CO2 19* 04/27/2014 1348   CO2 24 02/03/2014 1400    BUN 20.9 04/27/2014 1348   BUN 20 02/03/2014 1400   CREATININE 1.3* 04/27/2014 1348   CREATININE 1.16* 02/03/2014 1400   GLU 353* 04/07/2014 1113      Component Value Date/Time   CALCIUM 9.3 04/27/2014 1348   CALCIUM 10.0 02/03/2014 1400   ALKPHOS 181* 04/27/2014 1348   ALKPHOS 114 02/03/2014 1400   AST 15 04/27/2014 1348   AST 15 02/03/2014 1400   ALT 16 04/27/2014 1348   ALT 15 02/03/2014 1400   BILITOT <0.20 04/27/2014 1348   BILITOT 0.3 02/03/2014 1400       Lab Results  Component Value Date   LABCA2 51* 02/03/2014    No components found with this basename: LABCA125    No results found for this basename: INR,  in the last 168 hours  Urinalysis No results found for this basename: colorurine,  appearanceur,  labspec,  phurine,  glucoseu,  hgbur,  bilirubinur,  ketonesur,  proteinur,  urobilinogen,  nitrite,  leukocytesur    STUDIES: No results found.  ASSESSMENT: 58 y.o. BRCA negative [place] woman status post right breast biopsy 12/29/2013 for a clinical T2 N1, stage IIB invasive ductal carcinoma, grade 3, triple negative, with an MIB-1 of 80%.  (1) status post right lumpectomy and sentinel lymph node sampling 02/08/2014 for a pT2 pN0, stage IIA invasive ductal carcinoma, grade 3, repeat prognostic panel again triple negative  (2) to start adjuvant chemotherapy 03/02/2014, initially doxorubicin and cyclophosphamide in dose dense fashion x4 with Neulasta day 2, to be followed by carboplatin and paclitaxel weekly x12  (3) adjuvant radiation to follow chemotherapy  (4) status post remote TAH BSO   (5) Status post removal of a pituitary adenoma 09/05/2005, no malignancy identified  (6) Uncontrolled hypertension  (7) Anemia  PLAN: Lyvonne did not have labwork done last week during her nadir period because she was on vacation. This week she is particularly anemic with a hgb of 7.5 and will need a blood transfusion. We will hold the start of her paclitaxel for 1 week, and  administer blood tomorrow instead. Her blood glucose was  again elevated at 304 today. She has slacked on her diet and still doesn't have a glucometer at home. I will refer her to nutrition/diabetic counseling for further teaching and help with obtaining a glucometer.   We will resume day 1, cycle 1 of paclitaxel next Friday. She will return then for labs and an office visit. Manessa understands and agrees with this plan. She has been encouraged to call with any issues that may arise before her next visit here.   Marcelino Duster, NP  04/27/2014 3:51 PM

## 2014-04-28 ENCOUNTER — Other Ambulatory Visit: Payer: Self-pay | Admitting: *Deleted

## 2014-04-28 ENCOUNTER — Ambulatory Visit (HOSPITAL_BASED_OUTPATIENT_CLINIC_OR_DEPARTMENT_OTHER): Payer: BC Managed Care – PPO

## 2014-04-28 VITALS — BP 165/90 | HR 92 | Temp 97.7°F | Resp 18

## 2014-04-28 DIAGNOSIS — D6481 Anemia due to antineoplastic chemotherapy: Secondary | ICD-10-CM

## 2014-04-28 DIAGNOSIS — D649 Anemia, unspecified: Secondary | ICD-10-CM

## 2014-04-28 DIAGNOSIS — C50511 Malignant neoplasm of lower-outer quadrant of right female breast: Secondary | ICD-10-CM

## 2014-04-28 DIAGNOSIS — T451X5A Adverse effect of antineoplastic and immunosuppressive drugs, initial encounter: Secondary | ICD-10-CM

## 2014-04-28 MED ORDER — SODIUM CHLORIDE 0.9 % IJ SOLN
10.0000 mL | INTRAMUSCULAR | Status: AC | PRN
  Administered 2014-04-28: 10 mL
  Filled 2014-04-28: qty 10

## 2014-04-28 MED ORDER — SODIUM CHLORIDE 0.9 % IV SOLN
250.0000 mL | Freq: Once | INTRAVENOUS | Status: AC
  Administered 2014-04-28: 250 mL via INTRAVENOUS

## 2014-04-28 MED ORDER — HEPARIN SOD (PORK) LOCK FLUSH 100 UNIT/ML IV SOLN
250.0000 [IU] | INTRAVENOUS | Status: AC | PRN
  Administered 2014-04-28: 250 [IU]
  Filled 2014-04-28: qty 5

## 2014-04-28 NOTE — Patient Instructions (Signed)

## 2014-04-29 LAB — TYPE AND SCREEN
ABO/RH(D): A POS
ANTIBODY SCREEN: NEGATIVE
UNIT DIVISION: 0
Unit division: 0

## 2014-05-03 ENCOUNTER — Telehealth: Payer: Self-pay | Admitting: *Deleted

## 2014-05-03 NOTE — Telephone Encounter (Signed)
Per staff message I have moved all appts to later in the day

## 2014-05-04 ENCOUNTER — Telehealth: Payer: Self-pay | Admitting: Adult Health

## 2014-05-04 ENCOUNTER — Ambulatory Visit: Payer: BC Managed Care – PPO | Admitting: Nurse Practitioner

## 2014-05-04 ENCOUNTER — Other Ambulatory Visit: Payer: Self-pay | Admitting: Oncology

## 2014-05-04 ENCOUNTER — Other Ambulatory Visit: Payer: BC Managed Care – PPO

## 2014-05-04 ENCOUNTER — Other Ambulatory Visit: Payer: Self-pay | Admitting: Nurse Practitioner

## 2014-05-04 DIAGNOSIS — C50511 Malignant neoplasm of lower-outer quadrant of right female breast: Secondary | ICD-10-CM

## 2014-05-04 MED ORDER — DEXAMETHASONE 4 MG PO TABS: 8.0000 mg | ORAL_TABLET | Freq: Two times a day (BID) | ORAL | Status: DC

## 2014-05-04 MED ORDER — LORAZEPAM 0.5 MG PO TABS: 0.5000 mg | ORAL_TABLET | Freq: Every evening | ORAL | Status: DC | PRN

## 2014-05-04 MED ORDER — PROCHLORPERAZINE MALEATE 10 MG PO TABS
10.0000 mg | ORAL_TABLET | Freq: Four times a day (QID) | ORAL | Status: DC | PRN
Start: 2014-05-04 — End: 2014-05-05

## 2014-05-04 MED ORDER — ONDANSETRON HCL 8 MG PO TABS: 8.0000 mg | ORAL_TABLET | Freq: Two times a day (BID) | ORAL | Status: DC

## 2014-05-04 NOTE — Telephone Encounter (Signed)
per pof to sch pt appt-sent MW email-cld & spoke to pt and gace appt times & dates-pt understood

## 2014-05-05 ENCOUNTER — Other Ambulatory Visit (HOSPITAL_BASED_OUTPATIENT_CLINIC_OR_DEPARTMENT_OTHER): Payer: BC Managed Care – PPO

## 2014-05-05 ENCOUNTER — Encounter: Payer: BC Managed Care – PPO | Admitting: Nutrition

## 2014-05-05 ENCOUNTER — Other Ambulatory Visit: Payer: BC Managed Care – PPO

## 2014-05-05 ENCOUNTER — Ambulatory Visit (HOSPITAL_BASED_OUTPATIENT_CLINIC_OR_DEPARTMENT_OTHER): Payer: BC Managed Care – PPO

## 2014-05-05 ENCOUNTER — Other Ambulatory Visit: Payer: Self-pay | Admitting: Oncology

## 2014-05-05 ENCOUNTER — Encounter: Payer: Self-pay | Admitting: Adult Health

## 2014-05-05 ENCOUNTER — Ambulatory Visit (HOSPITAL_BASED_OUTPATIENT_CLINIC_OR_DEPARTMENT_OTHER): Payer: BC Managed Care – PPO | Admitting: Adult Health

## 2014-05-05 VITALS — BP 173/76 | HR 91 | Temp 98.4°F | Resp 18 | Ht 65.0 in | Wt 252.3 lb

## 2014-05-05 DIAGNOSIS — T451X5A Adverse effect of antineoplastic and immunosuppressive drugs, initial encounter: Secondary | ICD-10-CM

## 2014-05-05 DIAGNOSIS — D6481 Anemia due to antineoplastic chemotherapy: Secondary | ICD-10-CM

## 2014-05-05 DIAGNOSIS — Z171 Estrogen receptor negative status [ER-]: Secondary | ICD-10-CM

## 2014-05-05 DIAGNOSIS — C50919 Malignant neoplasm of unspecified site of unspecified female breast: Secondary | ICD-10-CM

## 2014-05-05 DIAGNOSIS — C50511 Malignant neoplasm of lower-outer quadrant of right female breast: Secondary | ICD-10-CM

## 2014-05-05 DIAGNOSIS — Z5111 Encounter for antineoplastic chemotherapy: Secondary | ICD-10-CM

## 2014-05-05 DIAGNOSIS — I10 Essential (primary) hypertension: Secondary | ICD-10-CM

## 2014-05-05 LAB — COMPREHENSIVE METABOLIC PANEL (CC13)
ALBUMIN: 3.7 g/dL (ref 3.5–5.0)
ALT: 28 U/L (ref 0–55)
AST: 19 U/L (ref 5–34)
Alkaline Phosphatase: 162 U/L — ABNORMAL HIGH (ref 40–150)
Anion Gap: 11 mEq/L (ref 3–11)
BUN: 24 mg/dL (ref 7.0–26.0)
CHLORIDE: 102 meq/L (ref 98–109)
CO2: 21 mEq/L — ABNORMAL LOW (ref 22–29)
Calcium: 9.4 mg/dL (ref 8.4–10.4)
Creatinine: 1.4 mg/dL — ABNORMAL HIGH (ref 0.6–1.1)
Glucose: 342 mg/dl — ABNORMAL HIGH (ref 70–140)
POTASSIUM: 4.2 meq/L (ref 3.5–5.1)
Sodium: 133 mEq/L — ABNORMAL LOW (ref 136–145)
Total Bilirubin: 0.52 mg/dL (ref 0.20–1.20)
Total Protein: 7.4 g/dL (ref 6.4–8.3)

## 2014-05-05 LAB — CBC WITH DIFFERENTIAL/PLATELET
BASO%: 0.3 % (ref 0.0–2.0)
Basophils Absolute: 0 10*3/uL (ref 0.0–0.1)
EOS%: 0.5 % (ref 0.0–7.0)
Eosinophils Absolute: 0 10*3/uL (ref 0.0–0.5)
HCT: 31.2 % — ABNORMAL LOW (ref 34.8–46.6)
HGB: 9.9 g/dL — ABNORMAL LOW (ref 11.6–15.9)
LYMPH%: 6 % — AB (ref 14.0–49.7)
MCH: 26.6 pg (ref 25.1–34.0)
MCHC: 31.7 g/dL (ref 31.5–36.0)
MCV: 83.9 fL (ref 79.5–101.0)
MONO#: 1.1 10*3/uL — ABNORMAL HIGH (ref 0.1–0.9)
MONO%: 13.9 % (ref 0.0–14.0)
NEUT#: 6.2 10*3/uL (ref 1.5–6.5)
NEUT%: 79.3 % — ABNORMAL HIGH (ref 38.4–76.8)
PLATELETS: 227 10*3/uL (ref 145–400)
RBC: 3.72 10*6/uL (ref 3.70–5.45)
RDW: 18.2 % — ABNORMAL HIGH (ref 11.2–14.5)
WBC: 7.8 10*3/uL (ref 3.9–10.3)
lymph#: 0.5 10*3/uL — ABNORMAL LOW (ref 0.9–3.3)

## 2014-05-05 MED ORDER — SODIUM CHLORIDE 0.9 % IJ SOLN
10.0000 mL | INTRAMUSCULAR | Status: DC | PRN
  Administered 2014-05-05: 10 mL
  Filled 2014-05-05: qty 10

## 2014-05-05 MED ORDER — ONDANSETRON 16 MG/50ML IVPB (CHCC)
INTRAVENOUS | Status: AC
  Filled 2014-05-05: qty 16

## 2014-05-05 MED ORDER — DIPHENHYDRAMINE HCL 50 MG/ML IJ SOLN
25.0000 mg | Freq: Once | INTRAMUSCULAR | Status: AC
  Administered 2014-05-05: 25 mg via INTRAVENOUS

## 2014-05-05 MED ORDER — SODIUM CHLORIDE 0.9 % IV SOLN
212.0000 mg | Freq: Once | INTRAVENOUS | Status: AC
  Administered 2014-05-05: 210 mg via INTRAVENOUS
  Filled 2014-05-05: qty 21

## 2014-05-05 MED ORDER — PACLITAXEL PROTEIN-BOUND CHEMO INJECTION 100 MG
125.0000 mg/m2 | Freq: Once | INTRAVENOUS | Status: AC
  Administered 2014-05-05: 275 mg via INTRAVENOUS
  Filled 2014-05-05: qty 55

## 2014-05-05 MED ORDER — SODIUM CHLORIDE 0.9 % IV SOLN
Freq: Once | INTRAVENOUS | Status: AC
  Administered 2014-05-05: 13:00:00 via INTRAVENOUS

## 2014-05-05 MED ORDER — DIPHENHYDRAMINE HCL 50 MG/ML IJ SOLN
INTRAMUSCULAR | Status: AC
  Filled 2014-05-05: qty 1

## 2014-05-05 MED ORDER — HEPARIN SOD (PORK) LOCK FLUSH 100 UNIT/ML IV SOLN
500.0000 [IU] | Freq: Once | INTRAVENOUS | Status: AC | PRN
  Administered 2014-05-05: 500 [IU]
  Filled 2014-05-05: qty 5

## 2014-05-05 MED ORDER — ONDANSETRON 16 MG/50ML IVPB (CHCC)
16.0000 mg | Freq: Once | INTRAVENOUS | Status: AC
  Administered 2014-05-05: 16 mg via INTRAVENOUS

## 2014-05-05 NOTE — Progress Notes (Signed)
[facility]  Telephone:(336) [phone] Fax:(336) [phone]     ID: [name] OB: [birth year]  MR#: [id number]  HBZ#:[id number]  PCP: Penny Noon, MD GYN:   SU: [name] OTHER MD:  CHIEF COMPLAINT: Triple negative breast cancer  CURRENT TREATMENT: Adjuvant chemotherapy  BREAST CANCER HISTORY: From the original intake note 01/18/2014:  The patient had routine bilateral screening mammography at the breast Center for 10/28/2013 showing a possible mass in the right breast. Additional views and right breast ultrasonography 12/20/2013 confirmed an irregular 1.3 cm right breast mass at the 6:00 position. This was not palpable. Ultrasound showed an irregular hypoechoic mass in the right breast measuring 1.4 cm. There was no right axillary adenopathy identified.  On 12/29/2013 the patient underwent right breast biopsy, showing ([id number]) and invasive ductal carcinoma, grade 3, estrogen and progesterone receptor negative, HER-2 not amplified with a signals ratio of 1.35 and a copy number per cell of 2.10, and an MIB-1 of 80%.  On 01/12/2014 the patient underwent bilateral breast MRI. This showed the mass in the right breast lower outer quadrant to measure 2.2 cm maximally. There was a 2 cm well circumscribed mass in the medial right breast which has been stable since 2011 him a and a 1 cm oval mass in the upper midline of the right breast, likely an intramammary lymph node, with benign features. A right axillary lymph node with cortical thickening measured 0.9 cm. The fatty hilum was possibly effaced; note Right axilla had been benign by [place] before biopsy. In the left breast there were no findings of concern.  The patient's subsequent history is as detailed below.  INTERVAL HISTORY: [name] is here for follow up of her breast cancer. Today is day 1, cycle 1 of 12 weekly planned paclitaxel and Carboplatin. She was supposed to receive her first cycle of  Paclitaxel/Carboplatin last week, however she did have anemia and received two units of packed red blood cells.  She tells me that she didn't realize how bad she felt until after she received the blood and had great energy.  She does have a glucometer to pick up at her pharmacy.  She is going to check her glucose BID, and she does take Glyburide and Metformin for this.  She is taking her medications as prescribed.  She denies any fevers, chills, nausea, vomiting, constipation, diarrhea, numbness/tingling, skin changes or any further concerns.   REVIEW OF SYSTEMS: A 10 point review of systems was conducted and is otherwise negative except for what is noted above.      PAST MEDICAL HISTORY: Past Medical History  Diagnosis Date  . HTN (hypertension)   . Diabetes mellitus   . High cholesterol   . Breast cancer   . Family history of malignant neoplasm of breast   . Glaucoma   . Wears glasses   . Wears dentures     full top-partial bottom    PAST SURGICAL HISTORY: Past Surgical History  Procedure Laterality Date  . Transphenoidal pituitary resection  2006  . Abdominal hysterectomy  1993  . Breast biopsy  2000    LEFT  . Tonsillectomy    . Portacath placement Left 02/08/2014    Procedure: INSERTION PORT-A-CATH;  Surgeon: Penny Bookbinder, MD;  Location: [facility];  Service: General;  Laterality: Left;    FAMILY HISTORY Family History  Problem Relation Age of Onset  . Hypertension Mother   . Diabetes type II Mother   . Breast cancer Mother  dx ~72; deceased 67  . Hypertension Father   . Breast cancer Maternal Aunt     deceased 49  . Cancer Maternal Uncle     unk. type; deceased 69s  . Cancer Maternal Uncle     unk. type; deceased late 10s  . Uterine cancer Cousin 19    daughter of an unaffected mat aunt who is 65   the patient's father is in his early 18s. The patient's mother died at the age of 61, from complications of breast cancer which had been  diagnosed "years before". The patient had 2 brothers, 2 sisters. The only other breast cancer in the family was a maternal aunt who died from breast cancer at the age of 18. (This means that 2 of 3 sisters, including the patient's mother, had breast cancer, one of them before the age of 21). There is no history of ovarian cancer in the family.  GYNECOLOGIC HISTORY:  Menarche age 52, first live birth age 72. The patient is GX P1. She underwent total abdominal hysterectomy with bilateral salpingo-oophorectomy at age 80. She took hormone replacement for one or 2 months old.  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 The patient is a widow. She lives by herself, with no pets. Her daughter [name] lives in [place], where she works in a Geneticist, molecular. The patient has no grandchildren. She is a Psychologist, forensic    ADVANCED DIRECTIVES: Not in place; advanced directives were discussed with the patient at her initial visit 01/18/2014. She intends to name her daughter as healthcare power of attorney. [name] can be reached at [phone]   HEALTH MAINTENANCE: History  Substance Use Topics  . Smoking status: Former Smoker    Types: Cigarettes    Quit date: 01/31/1994  . Smokeless tobacco: Not on file  . Alcohol Use: Yes     Comment: occ     Colonoscopy:  PAP: Status post hysterectomy  Bone density: Remote  Lipid panel:  No Known Allergies  Current Outpatient Prescriptions  Medication Sig Dispense Refill  . amLODipine (NORVASC) 10 MG tablet TAKE ONE TABLET BY MOUTH ONCE DAILY      . Atorvastatin Calcium (LIPITOR PO) Take 40 mg by mouth daily.       Marland Kitchen dexamethasone (DECADRON) 0.1 % ophthalmic solution Place 1 drop into both eyes daily.      Marland Kitchen glyBURIDE (DIABETA) 5 MG tablet  Take 5 mg by mouth daily with breakfast.       . latanoprost (XALATAN) 0.005 % ophthalmic solution Place 1 drop into both eyes at bedtime.      Marland Kitchen lisinopril-hydrochlorothiazide (PRINZIDE,ZESTORETIC) 20-12.5 MG per tablet Take 2 tablets by mouth daily.       . metFORMIN (GLUCOPHAGE-XR) 500 MG 24 hr tablet Take 500 mg by mouth 2 (two) times daily.       . ondansetron (ZOFRAN) 8 MG tablet Take 1 tablet (8 mg total) by mouth every 8 (eight) hours as needed for nausea or vomiting.  60 tablet  2  . tiZANidine (ZANAFLEX) 4 MG tablet 4 mg at bedtime.       . fluconazole (DIFLUCAN) 200 MG tablet Take 1 tablet (200 mg total) by mouth daily.  7 tablet  3  . glucose monitoring kit (FREESTYLE) monitoring kit  One Touch Ultra machine and strips, patient tests her blood sugars bid      . LORazepam (ATIVAN) 0.5 MG tablet Take 1 tablet (0.5 mg total) by mouth at bedtime as needed (Nausea or vomiting).  30 tablet  0  . oxyCODONE-acetaminophen (PERCOCET) 10-325 MG per tablet Take 1 tablet by mouth every 6 (six) hours as needed for pain.  20 tablet  0   No current facility-administered medications for this visit.   Facility-Administered Medications Ordered in Other Visits  Medication Dose Route Frequency Provider Last Rate Last Dose  . sodium chloride 0.9 % injection 10 mL  10 mL Intracatheter PRN Chauncey Cruel, MD   10 mL at 05/05/14 1527    OBJECTIVE: Middle-aged [place] American woman who appears stated age. She is obese not in acute distress  Filed Vitals:   05/05/14 1034  BP: 173/76  Pulse: 91  Temp: 98.4 F (36.9 C)  Resp: 18     Body mass index is 41.99 kg/(m^2).     GENERAL: Patient is a well appearing female in no acute distress HEENT:  Sclerae anicteric.  Oropharynx clear and moist. No ulcerations or evidence of oropharyngeal candidiasis. Neck is supple.  NODES:  No cervical, supraclavicular, or axillary lymphadenopathy palpated.  BREAST EXAM:  Deferred. LUNGS:  Clear to auscultation  bilaterally.  No wheezes or rhonchi. HEART:  Regular rate and rhythm. No murmur appreciated. ABDOMEN:  Soft, nontender.  Positive, normoactive bowel sounds. No organomegaly palpated. MSK:  No focal spinal tenderness to palpation. Full range of motion bilaterally in the upper extremities. EXTREMITIES:  No peripheral edema.   SKIN:  Clear with no obvious rashes or skin changes. No nail dyscrasia. NEURO:  Nonfocal. Well oriented.  Appropriate affect. ECOG FS:1 - Symptomatic but completely ambulatory    LAB RESULTS:  CMP  I No results found for this basename: SPEP,  UPEP,   kappa and lambda light chains    Lab Results  Component Value Date   WBC 7.8 05/05/2014   NEUTROABS 6.2 05/05/2014   HGB 9.9* 05/05/2014   HCT 31.2* 05/05/2014   MCV 83.9 05/05/2014   PLT 227 05/05/2014      Chemistry      Component Value Date/Time   NA 133* 05/05/2014 1003   NA 141 02/03/2014 1400   K 4.2 05/05/2014 1003   K 4.4 02/03/2014 1400   CL 100 02/03/2014 1400   CO2 21* 05/05/2014 1003   CO2 24 02/03/2014 1400   BUN 24.0 05/05/2014 1003   BUN 20 02/03/2014 1400   CREATININE 1.4* 05/05/2014 1003   CREATININE 1.16* 02/03/2014 1400   GLU 353* 04/07/2014 1113      Component Value Date/Time   CALCIUM 9.4 05/05/2014 1003   CALCIUM 10.0 02/03/2014 1400   ALKPHOS 162* 05/05/2014 1003   ALKPHOS 114 02/03/2014 1400   AST 19 05/05/2014 1003   AST 15 02/03/2014 1400   ALT 28 05/05/2014 1003   ALT 15 02/03/2014 1400   BILITOT 0.52 05/05/2014 1003   BILITOT 0.3 02/03/2014 1400       Lab Results  Component Value Date   LABCA2 51* 02/03/2014    No components found with this basename: LABCA125    No results found for this basename: INR,  in the last 168 hours  Urinalysis No results found for this basename: colorurine,  appearanceur,  labspec,  phurine,  glucoseu,  hgbur,  bilirubinur,  ketonesur,  proteinur,  urobilinogen,  nitrite,  leukocytesur  STUDIES: No results found.  ASSESSMENT: 58 y.o. BRCA negative  [place] woman status post right breast biopsy 12/29/2013 for a clinical T2 N1, stage IIB invasive ductal carcinoma, grade 3, triple negative, with an MIB-1 of 80%.  (1) status post right lumpectomy and sentinel lymph node sampling 02/08/2014 for a pT2 pN0, stage IIA invasive ductal carcinoma, grade 3, repeat prognostic panel again triple negative  (2) to start adjuvant chemotherapy 03/02/2014, initially doxorubicin and cyclophosphamide in dose dense fashion x4 with Neulasta day 2, to be followed by carboplatin and paclitaxel weekly x12  (3) adjuvant radiation to follow chemotherapy  (4) status post remote TAH BSO   (5) Status post removal of a pituitary adenoma 09/05/2005, no malignancy identified  (6) Uncontrolled hypertension  (7) Anemia  PLAN:  [name] is doing well today.  I reviewed her labs with her.  Her anemia due to treatment, though present is much improved.  Her hemoglobin is 9.9 today where it was 7.5 last week. Her blood sugar is 342.  She hasn't received dexamethasone in about 3 weeks.  She has uncontrolled diabetes and I am concerned about giving her taxol today with dexamethasone before hand and what that will do to her glucose.  I discussed this concern with Dr. Jana Hakim and her chemotherapy was changed to Abraxane and Carboplatin and she will receive this today.  I reviewed this with her in detail and gave her detailed information about Abraxane in her AVS.  She will follow up with her PCP about her diabetes and is going to pick up her glucometer today at her pharmacy.  I encouraged exercise and she will f/u with Dory Peru in nutrition today to discuss this concern as well.    [name] will return in one week for labs, evaluation, and chemotherapy.     [name] understands and agrees with this plan. She has been encouraged to call with any issues that may arise before her next visit here.   I spent 25 minutes counseling the patient face to face.  The total time spent in the  appointment was 30 minutes.  Minette Headland, [place] [phone] 05/05/2014 3:59 PM

## 2014-05-05 NOTE — Patient Instructions (Signed)

## 2014-05-05 NOTE — Patient Instructions (Signed)
[facility] Discharge Instructions for Patients Receiving Chemotherapy  Today you received the following chemotherapy agents Abraxane/Carboplatin.  To help prevent nausea and vomiting after your treatment, we encourage you to take your nausea medication as prescribed.   If you develop nausea and vomiting that is not controlled by your nausea medication, call the clinic.   BELOW ARE SYMPTOMS THAT SHOULD BE REPORTED IMMEDIATELY:  *FEVER GREATER THAN 100.5 F  *CHILLS WITH OR WITHOUT FEVER  NAUSEA AND VOMITING THAT IS NOT CONTROLLED WITH YOUR NAUSEA MEDICATION  *UNUSUAL SHORTNESS OF BREATH  *UNUSUAL BRUISING OR BLEEDING  TENDERNESS IN MOUTH AND THROAT WITH OR WITHOUT PRESENCE OF ULCERS  *URINARY PROBLEMS  *BOWEL PROBLEMS  UNUSUAL RASH Items with * indicate a potential emergency and should be followed up as soon as possible.  Feel free to call the clinic you have any questions or concerns. The clinic phone number is [phone].    

## 2014-05-08 ENCOUNTER — Telehealth: Payer: Self-pay | Admitting: *Deleted

## 2014-05-08 NOTE — Telephone Encounter (Signed)
Patient called asking for glucose meter ordered to be transferred from [facility] to [place] to use her funds.  Noted this is a "historical" entry per medication list.  Called [facility] on [address] who report dr. Melford Aase order the glucometer.  Instructed the use of funds are for things ordered by this office.

## 2014-05-09 ENCOUNTER — Telehealth: Payer: Self-pay | Admitting: *Deleted

## 2014-05-09 NOTE — Telephone Encounter (Signed)
Called pt on cell phone for post chemo follow up.  Left message on voice mail requesting a call back.  

## 2014-05-12 ENCOUNTER — Other Ambulatory Visit: Payer: Self-pay | Admitting: Oncology

## 2014-05-12 ENCOUNTER — Encounter: Payer: Self-pay | Admitting: Adult Health

## 2014-05-12 ENCOUNTER — Ambulatory Visit: Payer: BC Managed Care – PPO

## 2014-05-12 ENCOUNTER — Other Ambulatory Visit (HOSPITAL_BASED_OUTPATIENT_CLINIC_OR_DEPARTMENT_OTHER): Payer: BC Managed Care – PPO

## 2014-05-12 ENCOUNTER — Ambulatory Visit (HOSPITAL_BASED_OUTPATIENT_CLINIC_OR_DEPARTMENT_OTHER): Payer: BC Managed Care – PPO

## 2014-05-12 ENCOUNTER — Ambulatory Visit (HOSPITAL_BASED_OUTPATIENT_CLINIC_OR_DEPARTMENT_OTHER): Payer: BC Managed Care – PPO | Admitting: Adult Health

## 2014-05-12 VITALS — BP 174/90 | HR 107 | Temp 98.3°F | Resp 18 | Ht 65.0 in | Wt 250.0 lb

## 2014-05-12 VITALS — BP 133/69 | HR 92

## 2014-05-12 DIAGNOSIS — I10 Essential (primary) hypertension: Secondary | ICD-10-CM

## 2014-05-12 DIAGNOSIS — D649 Anemia, unspecified: Secondary | ICD-10-CM

## 2014-05-12 DIAGNOSIS — Z5111 Encounter for antineoplastic chemotherapy: Secondary | ICD-10-CM

## 2014-05-12 DIAGNOSIS — C50919 Malignant neoplasm of unspecified site of unspecified female breast: Secondary | ICD-10-CM

## 2014-05-12 DIAGNOSIS — C50511 Malignant neoplasm of lower-outer quadrant of right female breast: Secondary | ICD-10-CM

## 2014-05-12 DIAGNOSIS — E119 Type 2 diabetes mellitus without complications: Secondary | ICD-10-CM

## 2014-05-12 LAB — CBC WITH DIFFERENTIAL/PLATELET
BASO%: 0.6 % (ref 0.0–2.0)
Basophils Absolute: 0 10*3/uL (ref 0.0–0.1)
EOS%: 1.2 % (ref 0.0–7.0)
Eosinophils Absolute: 0.1 10*3/uL (ref 0.0–0.5)
HEMATOCRIT: 28.5 % — AB (ref 34.8–46.6)
HGB: 9.2 g/dL — ABNORMAL LOW (ref 11.6–15.9)
LYMPH#: 0.7 10*3/uL — AB (ref 0.9–3.3)
LYMPH%: 13.3 % — ABNORMAL LOW (ref 14.0–49.7)
MCH: 27.1 pg (ref 25.1–34.0)
MCHC: 32.3 g/dL (ref 31.5–36.0)
MCV: 83.8 fL (ref 79.5–101.0)
MONO#: 0.4 10*3/uL (ref 0.1–0.9)
MONO%: 7.8 % (ref 0.0–14.0)
NEUT#: 3.8 10*3/uL (ref 1.5–6.5)
NEUT%: 77.1 % — AB (ref 38.4–76.8)
Platelets: 231 10*3/uL (ref 145–400)
RBC: 3.4 10*6/uL — ABNORMAL LOW (ref 3.70–5.45)
RDW: 17.8 % — ABNORMAL HIGH (ref 11.2–14.5)
WBC: 5 10*3/uL (ref 3.9–10.3)

## 2014-05-12 LAB — COMPREHENSIVE METABOLIC PANEL (CC13)
ALT: 66 U/L — AB (ref 0–55)
AST: 37 U/L — AB (ref 5–34)
Albumin: 3.8 g/dL (ref 3.5–5.0)
Alkaline Phosphatase: 133 U/L (ref 40–150)
Anion Gap: 12 mEq/L — ABNORMAL HIGH (ref 3–11)
BUN: 19.3 mg/dL (ref 7.0–26.0)
CHLORIDE: 106 meq/L (ref 98–109)
CO2: 20 mEq/L — ABNORMAL LOW (ref 22–29)
CREATININE: 1.2 mg/dL — AB (ref 0.6–1.1)
Calcium: 9.6 mg/dL (ref 8.4–10.4)
Glucose: 195 mg/dl — ABNORMAL HIGH (ref 70–140)
Potassium: 4.7 mEq/L (ref 3.5–5.1)
SODIUM: 137 meq/L (ref 136–145)
TOTAL PROTEIN: 7.2 g/dL (ref 6.4–8.3)
Total Bilirubin: 0.35 mg/dL (ref 0.20–1.20)

## 2014-05-12 MED ORDER — DIPHENHYDRAMINE HCL 50 MG/ML IJ SOLN
25.0000 mg | Freq: Once | INTRAMUSCULAR | Status: AC
  Administered 2014-05-12: 25 mg via INTRAVENOUS

## 2014-05-12 MED ORDER — CLONIDINE HCL 0.1 MG PO TABS
0.2000 mg | ORAL_TABLET | Freq: Once | ORAL | Status: AC
  Administered 2014-05-12: 0.2 mg via ORAL

## 2014-05-12 MED ORDER — CLONIDINE HCL 0.1 MG PO TABS
ORAL_TABLET | ORAL | Status: AC
  Filled 2014-05-12: qty 2

## 2014-05-12 MED ORDER — DIPHENHYDRAMINE HCL 50 MG/ML IJ SOLN
INTRAMUSCULAR | Status: AC
Start: 2014-05-12 — End: 2014-05-12
  Filled 2014-05-12: qty 1

## 2014-05-12 MED ORDER — SODIUM CHLORIDE 0.9 % IV SOLN
239.0000 mg | Freq: Once | INTRAVENOUS | Status: AC
  Administered 2014-05-12: 240 mg via INTRAVENOUS
  Filled 2014-05-12: qty 24

## 2014-05-12 MED ORDER — PACLITAXEL PROTEIN-BOUND CHEMO INJECTION 100 MG
125.0000 mg/m2 | Freq: Once | INTRAVENOUS | Status: AC
  Administered 2014-05-12: 275 mg via INTRAVENOUS
  Filled 2014-05-12: qty 55

## 2014-05-12 MED ORDER — HEPARIN SOD (PORK) LOCK FLUSH 100 UNIT/ML IV SOLN
500.0000 [IU] | Freq: Once | INTRAVENOUS | Status: AC | PRN
  Administered 2014-05-12: 500 [IU]
  Filled 2014-05-12: qty 5

## 2014-05-12 MED ORDER — ONDANSETRON 16 MG/50ML IVPB (CHCC)
16.0000 mg | Freq: Once | INTRAVENOUS | Status: AC
  Administered 2014-05-12: 16 mg via INTRAVENOUS

## 2014-05-12 MED ORDER — SODIUM CHLORIDE 0.9 % IJ SOLN
10.0000 mL | INTRAMUSCULAR | Status: DC | PRN
  Administered 2014-05-12: 10 mL
  Filled 2014-05-12: qty 10

## 2014-05-12 MED ORDER — SODIUM CHLORIDE 0.9 % IV SOLN
Freq: Once | INTRAVENOUS | Status: AC
  Administered 2014-05-12: 15:00:00 via INTRAVENOUS

## 2014-05-12 MED ORDER — ONDANSETRON 16 MG/50ML IVPB (CHCC)
INTRAVENOUS | Status: AC
  Filled 2014-05-12: qty 16

## 2014-05-12 NOTE — Progress Notes (Signed)
[facility]  [phone] [phone]) [phone]     ID: [name] OB: 08/26/1956  MR#: [id number]  TDV#:[id number]  PCP: Chesley Noon, MD GYN:   SU: Rolm Bookbinder OTHER MD:  CHIEF COMPLAINT: Triple negative breast cancer  CURRENT TREATMENT: Adjuvant chemotherapy  BREAST CANCER HISTORY: From the original intake note 01/18/2014:  The patient had routine bilateral screening mammography at the breast Center for 10/28/2013 showing a possible mass in the right breast. Additional views and right breast ultrasonography 12/20/2013 confirmed an irregular 1.3 cm right breast mass at the 6:00 position. This was not palpable. Ultrasound showed an irregular hypoechoic mass in the right breast measuring 1.4 cm. There was no right axillary adenopathy identified.  On 12/29/2013 the patient underwent right breast biopsy, showing ([id number]) and invasive ductal carcinoma, grade 3, estrogen and progesterone receptor negative, HER-2 not amplified with a signals ratio of 1.35 and a copy number per cell of 2.10, and an MIB-1 of 80%.  On 01/12/2014 the patient underwent bilateral breast MRI. This showed the mass in the right breast lower outer quadrant to measure 2.2 cm maximally. There was a 2 cm well circumscribed mass in the medial right breast which has been stable since 2011 him a and a 1 cm oval mass in the upper midline of the right breast, likely an intramammary lymph node, with benign features. A right axillary lymph node with cortical thickening measured 0.9 cm. The fatty hilum was possibly effaced; note Right axilla had been benign by [place] before biopsy. In the left breast there were no findings of concern.  The patient's subsequent history is as detailed below.  INTERVAL HISTORY: [name] is here for follow up of her breast cancer. Today is day 1, cycle 2 of 12 weekly planned Abraxane and Carboplatin.  [name] is doing well today.  She had some mild cramping in her  calves. She did walk for 30 minutes twice this week which is an improvement from prior.  She was happy to not have a day of extreme fatigue following chemotherapy.  She denies fevers, chills, nausea, vomiting, constipation, diarrhea, neuropathy, skin changes, or any further concerns.    REVIEW OF SYSTEMS: A 10 point review of systems was conducted and is otherwise negative except for what is noted above.      PAST MEDICAL HISTORY: Past Medical History  Diagnosis Date  . HTN (hypertension)   . Diabetes mellitus   . High cholesterol   . Breast cancer   . Family history of malignant neoplasm of breast   . Glaucoma   . Wears glasses   . Wears dentures     full top-partial bottom    PAST SURGICAL HISTORY: Past Surgical History  Procedure Laterality Date  . Transphenoidal pituitary resection  2006  . Abdominal hysterectomy  1993  . Breast biopsy  2000    LEFT  . Tonsillectomy    . Portacath placement Left 02/08/2014    Procedure: INSERTION PORT-A-CATH;  Surgeon: Rolm Bookbinder, MD;  Location: [facility];  Service: General;  Laterality: Left;    FAMILY HISTORY Family History  Problem Relation Age of Onset  . Hypertension Mother   . Diabetes type II Mother   . Breast cancer Mother     dx ~97; deceased 55  . Hypertension Father   . Breast cancer Maternal Aunt     deceased 45  . Cancer Maternal Uncle     unk. type; deceased 26s  . Cancer Maternal  Uncle     unk. type; deceased late 75s  . Uterine cancer Cousin 77    daughter of an unaffected mat aunt who is 16   the patient's father is in his early 68s. The patient's mother died at the age of 7, from complications of breast cancer which had been diagnosed "years before". The patient had 2 brothers, 2 sisters. The only other breast cancer in the family was a maternal aunt who died from breast cancer at the age of 12. (This means that 2 of 3 sisters, including the patient's mother, had breast cancer, one of them  before the age of 52). There is no history of ovarian cancer in the family.  GYNECOLOGIC HISTORY:  Menarche age 45, first live birth age 56. The patient is GX P1. She underwent total abdominal hysterectomy with bilateral salpingo-oophorectomy at age 54. She took hormone replacement for one or 2 months old.  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 The patient is a widow. She lives by herself, with no pets. Her daughter [name] lives in [place], where she works in a Geneticist, molecular. The patient has no grandchildren. She is a Psychologist, forensic    ADVANCED DIRECTIVES: Not in place; advanced directives were discussed with the patient at her initial visit 01/18/2014. She intends to name her daughter as healthcare power of attorney. [name] can be reached at [phone]   HEALTH MAINTENANCE: History  Substance Use Topics  . Smoking status: Former Smoker    Types: Cigarettes    Quit date: 01/31/1994  . Smokeless tobacco: Not on file  . Alcohol Use: Yes     Comment: occ     Colonoscopy:  PAP: Status post hysterectomy  Bone density: Remote  Lipid panel:  No Known Allergies  Current Outpatient Prescriptions  Medication Sig Dispense Refill  . amLODipine (NORVASC) 10 MG tablet TAKE ONE TABLET BY MOUTH ONCE DAILY      . Atorvastatin Calcium (LIPITOR PO) Take 40 mg by mouth daily.       Marland Kitchen dexamethasone (DECADRON) 0.1 % ophthalmic solution Place 1 drop into both eyes daily.      Marland Kitchen glyBURIDE (DIABETA) 5 MG tablet Take 5 mg by mouth daily with breakfast.       . latanoprost (XALATAN) 0.005 % ophthalmic solution Place 1 drop into both eyes at bedtime.      Marland Kitchen lisinopril-hydrochlorothiazide (PRINZIDE,ZESTORETIC) 20-12.5 MG per tablet Take 2 tablets by mouth daily.       .  metFORMIN (GLUCOPHAGE-XR) 500 MG 24 hr tablet Take 500 mg by mouth 2 (two) times daily.       Marland Kitchen tiZANidine (ZANAFLEX) 4 MG tablet 4 mg at bedtime.       Marland Kitchen glucose monitoring kit (FREESTYLE) monitoring kit One Touch Ultra machine and strips, patient tests her blood sugars bid      . LORazepam (ATIVAN) 0.5 MG tablet Take 1 tablet (0.5 mg total) by mouth at bedtime as needed (Nausea or vomiting).  30 tablet  0  . ondansetron (ZOFRAN) 8 MG tablet Take 1 tablet (8 mg total) by mouth every 8 (eight) hours as needed for nausea or vomiting.  60 tablet  2  . oxyCODONE-acetaminophen (PERCOCET) 10-325 MG per tablet Take 1 tablet by mouth every 6 (six)  hours as needed for pain.  20 tablet  0   No current facility-administered medications for this visit.    OBJECTIVE: Middle-aged [place] American woman who appears stated age. She is obese not in acute distress  Filed Vitals:   05/12/14 1354  BP: 174/90  Pulse: 107  Temp: 98.3 F (36.8 C)  Resp: 18     Body mass index is 41.6 kg/(m^2).     GENERAL: Patient is a well appearing female in no acute distress HEENT:  Sclerae anicteric.  Oropharynx clear and moist. No ulcerations or evidence of oropharyngeal candidiasis. Neck is supple.  NODES:  No cervical, supraclavicular, or axillary lymphadenopathy palpated.  BREAST EXAM:  Deferred. LUNGS:  Clear to auscultation bilaterally.  No wheezes or rhonchi. HEART:  Regular rate and rhythm. No murmur appreciated. ABDOMEN:  Soft, nontender.  Positive, normoactive bowel sounds. No organomegaly palpated. MSK:  No focal spinal tenderness to palpation. Full range of motion bilaterally in the upper extremities. EXTREMITIES:  No peripheral edema.   SKIN:  Clear with no obvious rashes or skin changes. No nail dyscrasia. NEURO:  Nonfocal. Well oriented.  Appropriate affect. ECOG FS:1 - Symptomatic but completely ambulatory    LAB RESULTS:  CMP  I No results found for this basename: SPEP,  UPEP,   kappa and lambda  light chains    Lab Results  Component Value Date   WBC 5.0 05/12/2014   NEUTROABS 3.8 05/12/2014   HGB 9.2* 05/12/2014   HCT 28.5* 05/12/2014   MCV 83.8 05/12/2014   PLT 231 05/12/2014      Chemistry      Component Value Date/Time   NA 137 05/12/2014 1334   NA 141 02/03/2014 1400   K 4.7 05/12/2014 1334   K 4.4 02/03/2014 1400   CL 100 02/03/2014 1400   CO2 20* 05/12/2014 1334   CO2 24 02/03/2014 1400   BUN 19.3 05/12/2014 1334   BUN 20 02/03/2014 1400   CREATININE 1.2* 05/12/2014 1334   CREATININE 1.16* 02/03/2014 1400   GLU 353* 04/07/2014 1113      Component Value Date/Time   CALCIUM 9.6 05/12/2014 1334   CALCIUM 10.0 02/03/2014 1400   ALKPHOS 133 05/12/2014 1334   ALKPHOS 114 02/03/2014 1400   AST 37* 05/12/2014 1334   AST 15 02/03/2014 1400   ALT 66* 05/12/2014 1334   ALT 15 02/03/2014 1400   BILITOT 0.35 05/12/2014 1334   BILITOT 0.3 02/03/2014 1400       Lab Results  Component Value Date   LABCA2 51* 02/03/2014    No components found with this basename: LABCA125    No results found for this basename: INR,  in the last 168 hours  Urinalysis No results found for this basename: colorurine,  appearanceur,  labspec,  phurine,  glucoseu,  hgbur,  bilirubinur,  ketonesur,  proteinur,  urobilinogen,  nitrite,  leukocytesur    STUDIES: No results found.  ASSESSMENT: 58 y.o. BRCA negative [place] woman status post right breast biopsy 12/29/2013 for a clinical T2 N1, stage IIB invasive ductal carcinoma, grade 3, triple negative, with an MIB-1 of 80%.  (1) status post right lumpectomy and sentinel lymph node sampling 02/08/2014 for a pT2 pN0, stage IIA invasive ductal carcinoma, grade 3, repeat prognostic panel again triple negative  (2) to start adjuvant chemotherapy 03/02/2014, initially doxorubicin and cyclophosphamide in dose dense fashion x4 with Neulasta day 2, to be followed by carboplatin and paclitaxel weekly x12  (3) adjuvant radiation to follow  chemotherapy  (4) status post remote  TAH BSO   (5) Status post removal of a pituitary adenoma 09/05/2005, no malignancy identified  (6) Uncontrolled hypertension  (7) Anemia  PLAN:  Leilanni is doing well today.  Her labs are stable and she will proceed with her second cycle of Abraxane and Carboplatin.  I reviewed her labs with her in detail.    She continues to have treatment related anemia that we will continue to monitor.  She received blood last 2 weeks ago.    Her blood pressure is very elevated today.  I re-checked it in the office and it was 180/90 manually.  I gave her a one time dose of Clonidine 0.2 mg and called Jan RN, her nurse in the treatment room.  She will re-check her blood pressure and page me with the results. [name] has a f/u appointment with her PCP next week and will discuss her elevated BP with them.  I requested she keep a log of her blood pressures and see them.    Tacora's blood sugar is in the mid to high 100s today and she tells me that she is picking up her glucometer today.  She has f/u with her PCP next week to re-evaluate this.    Amelia will return in one week for labs, evaluation, and chemotherapy.    Oliviana understands and agrees with this plan. She has been encouraged to call with any issues that may arise before her next visit here.   I spent 25 minutes counseling the patient face to face.  The total time spent in the appointment was 30 minutes.  [name], [place] [phone] 05/12/2014 2:16 PM

## 2014-05-12 NOTE — Patient Instructions (Signed)
[facility] Discharge Instructions for Patients Receiving Chemotherapy  Today you received the following chemotherapy agents Abraxane/Carboplatin.  To help prevent nausea and vomiting after your treatment, we encourage you to take your nausea medication as prescribed.   If you develop nausea and vomiting that is not controlled by your nausea medication, call the clinic.   BELOW ARE SYMPTOMS THAT SHOULD BE REPORTED IMMEDIATELY:  *FEVER GREATER THAN 100.5 F  *CHILLS WITH OR WITHOUT FEVER  NAUSEA AND VOMITING THAT IS NOT CONTROLLED WITH YOUR NAUSEA MEDICATION  *UNUSUAL SHORTNESS OF BREATH  *UNUSUAL BRUISING OR BLEEDING  TENDERNESS IN MOUTH AND THROAT WITH OR WITHOUT PRESENCE OF ULCERS  *URINARY PROBLEMS  *BOWEL PROBLEMS  UNUSUAL RASH Items with * indicate a potential emergency and should be followed up as soon as possible.  Feel free to call the clinic you have any questions or concerns. The clinic phone number is [phone].    

## 2014-05-12 NOTE — Patient Instructions (Signed)
Clonidine tablets What is this medicine? CLONIDINE (KLOE ni deen) is used to treat high blood pressure. This medicine may be used for other purposes; ask your health care provider or pharmacist if you have questions. COMMON BRAND NAME(S): Catapres What should I tell my health care provider before I take this medicine? They need to know if you have any of these conditions: -kidney disease -an unusual or allergic reaction to clonidine, other medicines, foods, dyes, or preservatives -pregnant or trying to get pregnant -breast-feeding How should I use this medicine? Take this medicine by mouth with a glass of water. Follow the directions on the prescription label. Take your doses at regular intervals. Do not take your medicine more often than directed. Do not suddenly stop taking this medicine. You must gradually reduce the dose or you may get a dangerous increase in blood pressure. Ask your doctor or health care professional for advice. Talk to your pediatrician regarding the use of this medicine in children. Special care may be needed. Overdosage: If you think you have taken too much of this medicine contact a poison control center or emergency room at once. NOTE: This medicine is only for you. Do not share this medicine with others. What if I miss a dose? If you miss a dose, take it as soon as you can. If it is almost time for your next dose, take only that dose. Do not take double or extra doses. What may interact with this medicine? Do not take this medicine with any of the following medications: -MAOIs like Carbex, Eldepryl, Marplan, Nardil, and Parnate This medicine may also interact with the following medications: -barbiturate medicines for inducing sleep or treating seizures like phenobarbital -certain medicines for blood pressure, heart disease, irregular heart beat -certain medicines for depression, anxiety, or psychotic disturbances -prescription pain medicines This list may not  describe all possible interactions. Give your health care provider a list of all the medicines, herbs, non-prescription drugs, or dietary supplements you use. Also tell them if you smoke, drink alcohol, or use illegal drugs. Some items may interact with your medicine. What should I watch for while using this medicine? Visit your doctor or health care professional for regular checks on your progress. Check your heart rate and blood pressure regularly while you are taking this medicine. Ask your doctor or health care professional what your heart rate should be and when you should contact him or her. You may get drowsy or dizzy. Do not drive, use machinery, or do anything that needs mental alertness until you know how this medicine affects you. To avoid dizzy or fainting spells, do not stand or sit up quickly, especially if you are an older person. Alcohol can make you more drowsy and dizzy. Avoid alcoholic drinks. Your mouth may get dry. Chewing sugarless gum or sucking hard candy, and drinking plenty of water will help. Do not treat yourself for coughs, colds, or pain while you are taking this medicine without asking your doctor or health care professional for advice. Some ingredients may increase your blood pressure. If you are going to have surgery tell your doctor or health care professional that you are taking this medicine. What side effects may I notice from receiving this medicine? Side effects that you should report to your doctor or health care professional as soon as possible: -allergic reactions like skin rash, itching or hives, swelling of the face, lips, or tongue -anxiety, nervousness -chest pain -depression -fast, irregular heartbeat -swelling of feet or legs -unusually   weak or tired Side effects that usually do not require medical attention (report to your doctor or health care professional if they continue or are bothersome): -change in sex drive or  performance -constipation -headache This list may not describe all possible side effects. Call your doctor for medical advice about side effects. You may report side effects to FDA at 1-800-FDA-1088. Where should I keep my medicine? Keep out of the reach of children. Store at room temperature between 15 and 30 degrees C (59 and 86 degrees F). Protect from light. Keep container tightly closed. Throw away any unused medicine after the expiration date. NOTE: This sheet is a summary. It may not cover all possible information. If you have questions about this medicine, talk to your doctor, pharmacist, or health care provider.  2015, [employer]vier/Gold Standard. (2011-02-19 13:01:28)  

## 2014-05-19 ENCOUNTER — Ambulatory Visit (HOSPITAL_BASED_OUTPATIENT_CLINIC_OR_DEPARTMENT_OTHER): Payer: BC Managed Care – PPO | Admitting: Adult Health

## 2014-05-19 ENCOUNTER — Ambulatory Visit (HOSPITAL_BASED_OUTPATIENT_CLINIC_OR_DEPARTMENT_OTHER): Payer: BC Managed Care – PPO

## 2014-05-19 ENCOUNTER — Encounter: Payer: Self-pay | Admitting: Adult Health

## 2014-05-19 ENCOUNTER — Other Ambulatory Visit (HOSPITAL_BASED_OUTPATIENT_CLINIC_OR_DEPARTMENT_OTHER): Payer: BC Managed Care – PPO

## 2014-05-19 ENCOUNTER — Ambulatory Visit: Payer: BC Managed Care – PPO

## 2014-05-19 VITALS — BP 168/84 | HR 98 | Temp 98.6°F | Resp 20 | Ht 65.0 in | Wt 252.0 lb

## 2014-05-19 DIAGNOSIS — I10 Essential (primary) hypertension: Secondary | ICD-10-CM

## 2014-05-19 DIAGNOSIS — E118 Type 2 diabetes mellitus with unspecified complications: Secondary | ICD-10-CM

## 2014-05-19 DIAGNOSIS — C50511 Malignant neoplasm of lower-outer quadrant of right female breast: Secondary | ICD-10-CM

## 2014-05-19 DIAGNOSIS — D649 Anemia, unspecified: Secondary | ICD-10-CM

## 2014-05-19 DIAGNOSIS — C50919 Malignant neoplasm of unspecified site of unspecified female breast: Secondary | ICD-10-CM

## 2014-05-19 DIAGNOSIS — Z171 Estrogen receptor negative status [ER-]: Secondary | ICD-10-CM

## 2014-05-19 DIAGNOSIS — D6481 Anemia due to antineoplastic chemotherapy: Secondary | ICD-10-CM

## 2014-05-19 DIAGNOSIS — Z5111 Encounter for antineoplastic chemotherapy: Secondary | ICD-10-CM

## 2014-05-19 DIAGNOSIS — T451X5A Adverse effect of antineoplastic and immunosuppressive drugs, initial encounter: Secondary | ICD-10-CM

## 2014-05-19 LAB — COMPREHENSIVE METABOLIC PANEL (CC13)
ALT: 35 U/L (ref 0–55)
ANION GAP: 11 meq/L (ref 3–11)
AST: 25 U/L (ref 5–34)
Albumin: 3.9 g/dL (ref 3.5–5.0)
Alkaline Phosphatase: 125 U/L (ref 40–150)
BUN: 27.1 mg/dL — ABNORMAL HIGH (ref 7.0–26.0)
CO2: 20 meq/L — AB (ref 22–29)
Calcium: 9.2 mg/dL (ref 8.4–10.4)
Chloride: 106 mEq/L (ref 98–109)
Creatinine: 1.3 mg/dL — ABNORMAL HIGH (ref 0.6–1.1)
GLUCOSE: 197 mg/dL — AB (ref 70–140)
Potassium: 4.4 mEq/L (ref 3.5–5.1)
SODIUM: 137 meq/L (ref 136–145)
Total Bilirubin: 0.42 mg/dL (ref 0.20–1.20)
Total Protein: 7.3 g/dL (ref 6.4–8.3)

## 2014-05-19 LAB — CBC WITH DIFFERENTIAL/PLATELET
BASO%: 1 % (ref 0.0–2.0)
Basophils Absolute: 0 10*3/uL (ref 0.0–0.1)
EOS ABS: 0.1 10*3/uL (ref 0.0–0.5)
EOS%: 2.1 % (ref 0.0–7.0)
HEMATOCRIT: 27.1 % — AB (ref 34.8–46.6)
HEMOGLOBIN: 8.5 g/dL — AB (ref 11.6–15.9)
LYMPH%: 12.8 % — ABNORMAL LOW (ref 14.0–49.7)
MCH: 26.4 pg (ref 25.1–34.0)
MCHC: 31.3 g/dL — ABNORMAL LOW (ref 31.5–36.0)
MCV: 84.3 fL (ref 79.5–101.0)
MONO#: 0.3 10*3/uL (ref 0.1–0.9)
MONO%: 7.7 % (ref 0.0–14.0)
NEUT%: 76.4 % (ref 38.4–76.8)
NEUTROS ABS: 2.7 10*3/uL (ref 1.5–6.5)
PLATELETS: 224 10*3/uL (ref 145–400)
RBC: 3.21 10*6/uL — AB (ref 3.70–5.45)
RDW: 18.3 % — ABNORMAL HIGH (ref 11.2–14.5)
WBC: 3.6 10*3/uL — ABNORMAL LOW (ref 3.9–10.3)
lymph#: 0.5 10*3/uL — ABNORMAL LOW (ref 0.9–3.3)

## 2014-05-19 MED ORDER — CARBOPLATIN CHEMO INJECTION 450 MG/45ML
239.0000 mg | Freq: Once | INTRAVENOUS | Status: AC
  Administered 2014-05-19: 240 mg via INTRAVENOUS
  Filled 2014-05-19: qty 24

## 2014-05-19 MED ORDER — ONDANSETRON 16 MG/50ML IVPB (CHCC)
INTRAVENOUS | Status: AC
  Filled 2014-05-19: qty 16

## 2014-05-19 MED ORDER — DIPHENHYDRAMINE HCL 50 MG/ML IJ SOLN
25.0000 mg | Freq: Once | INTRAMUSCULAR | Status: AC
  Administered 2014-05-19: 25 mg via INTRAVENOUS

## 2014-05-19 MED ORDER — DIPHENHYDRAMINE HCL 50 MG/ML IJ SOLN
INTRAMUSCULAR | Status: AC
  Filled 2014-05-19: qty 1

## 2014-05-19 MED ORDER — HEPARIN SOD (PORK) LOCK FLUSH 100 UNIT/ML IV SOLN
500.0000 [IU] | Freq: Once | INTRAVENOUS | Status: AC | PRN
  Administered 2014-05-19: 500 [IU]
  Filled 2014-05-19: qty 5

## 2014-05-19 MED ORDER — SODIUM CHLORIDE 0.9 % IV SOLN
Freq: Once | INTRAVENOUS | Status: AC
  Administered 2014-05-19: 14:00:00 via INTRAVENOUS

## 2014-05-19 MED ORDER — PACLITAXEL PROTEIN-BOUND CHEMO INJECTION 100 MG
125.0000 mg/m2 | Freq: Once | INTRAVENOUS | Status: AC
Start: 2014-05-19 — End: 2014-05-19
  Administered 2014-05-19: 275 mg via INTRAVENOUS
  Filled 2014-05-19: qty 55

## 2014-05-19 MED ORDER — SODIUM CHLORIDE 0.9 % IJ SOLN
10.0000 mL | INTRAMUSCULAR | Status: DC | PRN
  Administered 2014-05-19: 10 mL
  Filled 2014-05-19: qty 10

## 2014-05-19 MED ORDER — ONDANSETRON 16 MG/50ML IVPB (CHCC)
16.0000 mg | Freq: Once | INTRAVENOUS | Status: AC
  Administered 2014-05-19: 16 mg via INTRAVENOUS

## 2014-05-19 NOTE — Progress Notes (Signed)
[place]  Telephone:(336) [phone] Fax:(336) [phone]     ID: [name] OB: 02-May-1956  MR#: [id number]  DZH#:[id number]  PCP: Chesley Noon, MD GYN:   SU: Rolm Bookbinder OTHER MD:  CHIEF COMPLAINT: Triple negative breast cancer  CURRENT TREATMENT: Adjuvant chemotherapy  BREAST CANCER HISTORY: From the original intake note 01/18/2014:  The patient had routine bilateral screening mammography at the breast Center for 10/28/2013 showing a possible mass in the right breast. Additional views and right breast ultrasonography 12/20/2013 confirmed an irregular 1.3 cm right breast mass at the 6:00 position. This was not palpable. Ultrasound showed an irregular hypoechoic mass in the right breast measuring 1.4 cm. There was no right axillary adenopathy identified.  On 12/29/2013 the patient underwent right breast biopsy, showing ([id number]) and invasive ductal carcinoma, grade 3, estrogen and progesterone receptor negative, HER-2 not amplified with a signals ratio of 1.35 and a copy number per cell of 2.10, and an MIB-1 of 80%.  On 01/12/2014 the patient underwent bilateral breast MRI. This showed the mass in the right breast lower outer quadrant to measure 2.2 cm maximally. There was a 2 cm well circumscribed mass in the medial right breast which has been stable since 2011 him a and a 1 cm oval mass in the upper midline of the right breast, likely an intramammary lymph node, with benign features. A right axillary lymph node with cortical thickening measured 0.9 cm. The fatty hilum was possibly effaced; note Right axilla had been benign by [place] before biopsy. In the left breast there were no findings of concern.  The patient's subsequent history is as detailed below.  INTERVAL HISTORY: [name] is here for follow up of her breast cancer. Today is day 1, cycle 3 of 12 weekly planned Abraxane and Carboplatin.  Kiondra is doing well today.  She did have some mild diarrhea  for a day that has now resolved.  She does have occasional tingling in her fingertips and toes. This is intermittent and mild.  She did get nauseated the third day after treatment this week.  She otherwise is feeling well and denies fevers, chills, nausea, vomiting,constipation, diarrhea, or any further concerns.    REVIEW OF SYSTEMS: A 10 point review of systems was conducted and is otherwise negative except for what is noted above.      PAST MEDICAL HISTORY: Past Medical History  Diagnosis Date  . HTN (hypertension)   . Diabetes mellitus   . High cholesterol   . Breast cancer   . Family history of malignant neoplasm of breast   . Glaucoma   . Wears glasses   . Wears dentures     full top-partial bottom    PAST SURGICAL HISTORY: Past Surgical History  Procedure Laterality Date  . Transphenoidal pituitary resection  2006  . Abdominal hysterectomy  1993  . Breast biopsy  2000    LEFT  . Tonsillectomy    . Portacath placement Left 02/08/2014    Procedure: INSERTION PORT-A-CATH;  Surgeon: Rolm Bookbinder, MD;  Location: [facility];  Service: General;  Laterality: Left;    FAMILY HISTORY Family History  Problem Relation Age of Onset  . Hypertension Mother   . Diabetes type II Mother   . Breast cancer Mother     dx ~56; deceased 33  . Hypertension Father   . Breast cancer Maternal Aunt     deceased 56  . Cancer Maternal Uncle     unk. type; deceased  58s  . Cancer Maternal Uncle     unk. type; deceased late 19s  . Uterine cancer Cousin 56    daughter of an unaffected mat aunt who is 78   the patient's father is in his early 81s. The patient's mother died at the age of 76, from complications of breast cancer which had been diagnosed "years before". The patient had 2 brothers, 2 sisters. The only other breast cancer in the family was a maternal aunt who died from breast cancer at the age of 13. (This means that 2 of 3 sisters, including the patient's mother,  had breast cancer, one of them before the age of 44). There is no history of ovarian cancer in the family.  GYNECOLOGIC HISTORY:  Menarche age 4, first live birth age 58. The patient is GX P1. She underwent total abdominal hysterectomy with bilateral salpingo-oophorectomy at age 58. She took hormone replacement for one or 2 months old.  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 The patient is a widow. She lives by herself, with no pets. Her daughter Sheilah Rayos lives in [place], where she works in a Geneticist, molecular. The patient has no grandchildren. She is a Psychologist, forensic   ADVANCED DIRECTIVES: Not in place; advanced directives were discussed with the patient at her initial visit 01/18/2014. She intends to name her daughter as healthcare power of attorney. [name] can be reached at [phone]   HEALTH MAINTENANCE: History  Substance Use Topics  . Smoking status: Former Smoker    Types: Cigarettes    Quit date: 01/31/1994  . Smokeless tobacco: Not on file  . Alcohol Use: Yes     Comment: occ     Colonoscopy:  PAP: Status post hysterectomy  Bone density: Remote  Lipid panel:  No Known Allergies  Current Outpatient Prescriptions  Medication Sig Dispense Refill  . amLODipine (NORVASC) 10 MG tablet TAKE ONE TABLET BY MOUTH ONCE DAILY      . Atorvastatin Calcium (LIPITOR PO) Take 40 mg by mouth daily.       Marland Kitchen dexamethasone (DECADRON) 0.1 % ophthalmic solution Place 1 drop into both eyes daily.      Marland Kitchen glyBURIDE (DIABETA) 5 MG tablet Take 5 mg by mouth daily with breakfast.       . latanoprost (XALATAN) 0.005 % ophthalmic solution Place 1 drop into both eyes at bedtime.      Marland Kitchen lisinopril-hydrochlorothiazide (PRINZIDE,ZESTORETIC) 20-12.5 MG per tablet Take 2 tablets by  mouth daily.       Marland Kitchen LORazepam (ATIVAN) 0.5 MG tablet Take 1 tablet (0.5 mg total) by mouth at bedtime as needed (Nausea or vomiting).  30 tablet  0  . metFORMIN (GLUCOPHAGE-XR) 500 MG 24 hr tablet Take 500 mg by mouth 2 (two) times daily.       . ondansetron (ZOFRAN) 8 MG tablet Take 1 tablet (8 mg total) by mouth every 8 (eight) hours as needed for nausea or vomiting.  60 tablet  2  . tiZANidine (ZANAFLEX) 4 MG tablet 4 mg at bedtime.       Marland Kitchen glucose monitoring kit (FREESTYLE) monitoring kit One Touch Ultra machine and strips, patient tests her blood sugars bid      . oxyCODONE-acetaminophen (PERCOCET) 10-325 MG per tablet Take 1 tablet by  mouth every 6 (six) hours as needed for pain.  20 tablet  0   No current facility-administered medications for this visit.    OBJECTIVE: Middle-aged [place] American woman who appears stated age. She is obese not in acute distress  Filed Vitals:   05/19/14 1323  BP: 168/84  Pulse: 98  Temp: 98.6 F (37 C)  Resp: 20     Body mass index is 41.93 kg/(m^2).     GENERAL: Patient is a well appearing female in no acute distress HEENT:  Sclerae anicteric.  Oropharynx clear and moist. No ulcerations or evidence of oropharyngeal candidiasis. Neck is supple.  NODES:  No cervical, supraclavicular, or axillary lymphadenopathy palpated.  BREAST EXAM:  Deferred. LUNGS:  Clear to auscultation bilaterally.  No wheezes or rhonchi. HEART:  Regular rate and rhythm. No murmur appreciated. ABDOMEN:  Soft, nontender.  Positive, normoactive bowel sounds. No organomegaly palpated. MSK:  No focal spinal tenderness to palpation. Full range of motion bilaterally in the upper extremities. EXTREMITIES:  No peripheral edema.   SKIN:  Clear with no obvious rashes or skin changes. No nail dyscrasia. NEURO:  Nonfocal. Well oriented.  Appropriate affect. ECOG FS:1 - Symptomatic but completely ambulatory    LAB RESULTS:  CMP  I No results found for this basename: SPEP,   UPEP,   kappa and lambda light chains    Lab Results  Component Value Date   WBC 3.6* 05/19/2014   NEUTROABS 2.7 05/19/2014   HGB 8.5* 05/19/2014   HCT 27.1* 05/19/2014   MCV 84.3 05/19/2014   PLT 224 05/19/2014      Chemistry      Component Value Date/Time   NA 137 05/19/2014 1302   NA 141 02/03/2014 1400   K 4.4 05/19/2014 1302   K 4.4 02/03/2014 1400   CL 100 02/03/2014 1400   CO2 20* 05/19/2014 1302   CO2 24 02/03/2014 1400   BUN 27.1* 05/19/2014 1302   BUN 20 02/03/2014 1400   CREATININE 1.3* 05/19/2014 1302   CREATININE 1.16* 02/03/2014 1400   GLU 353* 04/07/2014 1113      Component Value Date/Time   CALCIUM 9.2 05/19/2014 1302   CALCIUM 10.0 02/03/2014 1400   ALKPHOS 125 05/19/2014 1302   ALKPHOS 114 02/03/2014 1400   AST 25 05/19/2014 1302   AST 15 02/03/2014 1400   ALT 35 05/19/2014 1302   ALT 15 02/03/2014 1400   BILITOT 0.42 05/19/2014 1302   BILITOT 0.3 02/03/2014 1400       Lab Results  Component Value Date   LABCA2 51* 02/03/2014    No components found with this basename: LABCA125    No results found for this basename: INR,  in the last 168 hours  Urinalysis No results found for this basename: colorurine,  appearanceur,  labspec,  phurine,  glucoseu,  hgbur,  bilirubinur,  ketonesur,  proteinur,  urobilinogen,  nitrite,  leukocytesur    STUDIES: No results found.  ASSESSMENT: 58 y.o. BRCA negative [place] woman status post right breast biopsy 12/29/2013 for a clinical T2 N1, stage IIB invasive ductal carcinoma, grade 3, triple negative, with an MIB-1 of 80%.  (1) status post right lumpectomy and sentinel lymph node sampling 02/08/2014 for a pT2 pN0, stage IIA invasive ductal carcinoma, grade 3, repeat prognostic panel again triple negative  (2) to start adjuvant chemotherapy 03/02/2014, initially doxorubicin and cyclophosphamide in dose dense fashion x4 with Neulasta day 2, to be followed by carboplatin and paclitaxel weekly x12  (3)  adjuvant radiation to follow  chemotherapy  (4) status post remote TAH BSO   (5) Status post removal of a pituitary adenoma 09/05/2005, no malignancy identified  (6) Uncontrolled hypertension  (7) Anemia  PLAN:  Jacyln is doing well today.  Her CBC is stable and she will proceed with her third cycle of Abraxane and Carboplatin.  I reviewed her labs with her in detail.    A CMP is pending.  She continues to have treatment related anemia that we will continue to monitor.  She received blood last 3 weeks ago.    Her blood pressure is 168/84.  She says that she went to her PCP yesterday who said that her blood pressure was okay and no medication changes are needed. Her BP yesterday in the office was 130/80s  Kaelyn's blood sugar has not yet returned today.  She saw her PCP yesterday and they have made changes in her blood glucoses and she says that she will get her meter this week.    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  Laney will take Ondansetron daily after chemotherapy to help prevent the nausea like she experienced last week.    Briggette will return in one week for labs, evaluation, and chemotherapy.    Alizay understands and agrees with this plan. She has been encouraged to call with any issues that may arise before her next visit here.   I spent 25 minutes counseling the patient face to face.  The total time spent in the appointment was 30 minutes.  Minette Headland, [place] [phone] 05/19/2014 1:48 PM

## 2014-05-19 NOTE — Patient Instructions (Signed)
[facility] Discharge Instructions for Patients Receiving Chemotherapy  Today you received the following chemotherapy agents Abraxane and Carboplatin  To help prevent nausea and vomiting after your treatment, we encourage you to take your nausea medication as prescribed.  If you develop nausea and vomiting that is not controlled by your nausea medication, call the clinic.   BELOW ARE SYMPTOMS THAT SHOULD BE REPORTED IMMEDIATELY:  *FEVER GREATER THAN 100.5 F  *CHILLS WITH OR WITHOUT FEVER  NAUSEA AND VOMITING THAT IS NOT CONTROLLED WITH YOUR NAUSEA MEDICATION  *UNUSUAL SHORTNESS OF BREATH  *UNUSUAL BRUISING OR BLEEDING  TENDERNESS IN MOUTH AND THROAT WITH OR WITHOUT PRESENCE OF ULCERS  *URINARY PROBLEMS  *BOWEL PROBLEMS  UNUSUAL RASH Items with * indicate a potential emergency and should be followed up as soon as possible.  Feel free to call the clinic you have any questions or concerns. The clinic phone number is [phone].    

## 2014-05-26 ENCOUNTER — Other Ambulatory Visit: Payer: Self-pay | Admitting: Hematology and Oncology

## 2014-05-26 ENCOUNTER — Ambulatory Visit: Payer: BC Managed Care – PPO

## 2014-05-26 ENCOUNTER — Telehealth: Payer: Self-pay | Admitting: Oncology

## 2014-05-26 ENCOUNTER — Ambulatory Visit (HOSPITAL_BASED_OUTPATIENT_CLINIC_OR_DEPARTMENT_OTHER): Payer: BC Managed Care – PPO

## 2014-05-26 ENCOUNTER — Ambulatory Visit (HOSPITAL_BASED_OUTPATIENT_CLINIC_OR_DEPARTMENT_OTHER): Payer: BC Managed Care – PPO | Admitting: Nurse Practitioner

## 2014-05-26 ENCOUNTER — Encounter: Payer: Self-pay | Admitting: Nurse Practitioner

## 2014-05-26 ENCOUNTER — Other Ambulatory Visit (HOSPITAL_BASED_OUTPATIENT_CLINIC_OR_DEPARTMENT_OTHER): Payer: BC Managed Care – PPO

## 2014-05-26 VITALS — BP 160/80 | HR 108 | Temp 98.3°F | Resp 18 | Ht 65.0 in | Wt 251.0 lb

## 2014-05-26 DIAGNOSIS — Z5111 Encounter for antineoplastic chemotherapy: Secondary | ICD-10-CM

## 2014-05-26 DIAGNOSIS — C50919 Malignant neoplasm of unspecified site of unspecified female breast: Secondary | ICD-10-CM

## 2014-05-26 DIAGNOSIS — C50511 Malignant neoplasm of lower-outer quadrant of right female breast: Secondary | ICD-10-CM

## 2014-05-26 DIAGNOSIS — D6481 Anemia due to antineoplastic chemotherapy: Secondary | ICD-10-CM

## 2014-05-26 DIAGNOSIS — I10 Essential (primary) hypertension: Secondary | ICD-10-CM

## 2014-05-26 DIAGNOSIS — T451X5A Adverse effect of antineoplastic and immunosuppressive drugs, initial encounter: Secondary | ICD-10-CM

## 2014-05-26 DIAGNOSIS — R110 Nausea: Secondary | ICD-10-CM

## 2014-05-26 DIAGNOSIS — R197 Diarrhea, unspecified: Secondary | ICD-10-CM | POA: Insufficient documentation

## 2014-05-26 DIAGNOSIS — E118 Type 2 diabetes mellitus with unspecified complications: Secondary | ICD-10-CM

## 2014-05-26 DIAGNOSIS — G5790 Unspecified mononeuropathy of unspecified lower limb: Secondary | ICD-10-CM

## 2014-05-26 LAB — COMPREHENSIVE METABOLIC PANEL (CC13)
ALBUMIN: 3.9 g/dL (ref 3.5–5.0)
ALT: 33 U/L (ref 0–55)
ANION GAP: 11 meq/L (ref 3–11)
AST: 26 U/L (ref 5–34)
Alkaline Phosphatase: 125 U/L (ref 40–150)
BILIRUBIN TOTAL: 0.48 mg/dL (ref 0.20–1.20)
BUN: 20.6 mg/dL (ref 7.0–26.0)
CO2: 18 meq/L — AB (ref 22–29)
Calcium: 9.1 mg/dL (ref 8.4–10.4)
Chloride: 107 mEq/L (ref 98–109)
Creatinine: 1.2 mg/dL — ABNORMAL HIGH (ref 0.6–1.1)
GLUCOSE: 118 mg/dL (ref 70–140)
POTASSIUM: 3.9 meq/L (ref 3.5–5.1)
SODIUM: 136 meq/L (ref 136–145)
Total Protein: 7.3 g/dL (ref 6.4–8.3)

## 2014-05-26 LAB — CBC WITH DIFFERENTIAL/PLATELET
BASO%: 1 % (ref 0.0–2.0)
Basophils Absolute: 0 10*3/uL (ref 0.0–0.1)
EOS%: 0.9 % (ref 0.0–7.0)
Eosinophils Absolute: 0 10*3/uL (ref 0.0–0.5)
HEMATOCRIT: 25 % — AB (ref 34.8–46.6)
HGB: 8.1 g/dL — ABNORMAL LOW (ref 11.6–15.9)
LYMPH#: 0.4 10*3/uL — AB (ref 0.9–3.3)
LYMPH%: 12.4 % — AB (ref 14.0–49.7)
MCH: 27 pg (ref 25.1–34.0)
MCHC: 32.3 g/dL (ref 31.5–36.0)
MCV: 83.7 fL (ref 79.5–101.0)
MONO#: 0.2 10*3/uL (ref 0.1–0.9)
MONO%: 5.9 % (ref 0.0–14.0)
NEUT#: 2.5 10*3/uL (ref 1.5–6.5)
NEUT%: 79.8 % — AB (ref 38.4–76.8)
PLATELETS: 172 10*3/uL (ref 145–400)
RBC: 2.99 10*6/uL — ABNORMAL LOW (ref 3.70–5.45)
RDW: 18.2 % — ABNORMAL HIGH (ref 11.2–14.5)
WBC: 3.1 10*3/uL — ABNORMAL LOW (ref 3.9–10.3)

## 2014-05-26 MED ORDER — DIPHENHYDRAMINE HCL 50 MG/ML IJ SOLN
25.0000 mg | Freq: Once | INTRAMUSCULAR | Status: AC
Start: 2014-05-26 — End: 2014-05-26
  Administered 2014-05-26: 25 mg via INTRAVENOUS

## 2014-05-26 MED ORDER — PACLITAXEL PROTEIN-BOUND CHEMO INJECTION 100 MG
125.0000 mg/m2 | Freq: Once | INTRAVENOUS | Status: AC
  Administered 2014-05-26: 275 mg via INTRAVENOUS
  Filled 2014-05-26: qty 55

## 2014-05-26 MED ORDER — ONDANSETRON 16 MG/50ML IVPB (CHCC)
INTRAVENOUS | Status: AC
  Filled 2014-05-26: qty 16

## 2014-05-26 MED ORDER — SODIUM CHLORIDE 0.9 % IJ SOLN
10.0000 mL | INTRAMUSCULAR | Status: DC | PRN
  Administered 2014-05-26: 10 mL
  Filled 2014-05-26: qty 10

## 2014-05-26 MED ORDER — SODIUM CHLORIDE 0.9 % IV SOLN
239.0000 mg | Freq: Once | INTRAVENOUS | Status: AC
  Administered 2014-05-26: 240 mg via INTRAVENOUS
  Filled 2014-05-26: qty 24

## 2014-05-26 MED ORDER — DIPHENHYDRAMINE HCL 50 MG/ML IJ SOLN
INTRAMUSCULAR | Status: AC
  Filled 2014-05-26: qty 1

## 2014-05-26 MED ORDER — HEPARIN SOD (PORK) LOCK FLUSH 100 UNIT/ML IV SOLN
500.0000 [IU] | Freq: Once | INTRAVENOUS | Status: AC | PRN
  Administered 2014-05-26: 500 [IU]
  Filled 2014-05-26: qty 5

## 2014-05-26 MED ORDER — SODIUM CHLORIDE 0.9 % IV SOLN
Freq: Once | INTRAVENOUS | Status: AC
  Administered 2014-05-26: 15:00:00 via INTRAVENOUS

## 2014-05-26 MED ORDER — ONDANSETRON 16 MG/50ML IVPB (CHCC)
16.0000 mg | Freq: Once | INTRAVENOUS | Status: AC
  Administered 2014-05-26: 16 mg via INTRAVENOUS

## 2014-05-26 NOTE — Progress Notes (Signed)
Pt did not pick up glucometer from Pharmacy that we had her primary doctor to order for her after several episodes of elevated blood sugars. The pharmacy has now sent it back b/c pt didn't pick it up after 3 weeks. Pt tells me she has a glucometer but needs a battery. I have talked to pt about the importance of checking her blood sugars for the past 2 months. I will continue to educate pt about this. Blood glucose levels good today (118).

## 2014-05-26 NOTE — Telephone Encounter (Signed)
appts complete as requested per 9/18 pof. pt given schedule while in inf.

## 2014-05-26 NOTE — Progress Notes (Signed)
[place]  Telephone:(336) [phone] Fax:(336) [phone]     ID: [name] OB: 1956/04/26  MR#: [id number]  NID#:[id number]  PCP: Penny Noon, MD GYN:   SU: Rolm Bookbinder OTHER MD:  CHIEF COMPLAINT: Triple negative breast cancer  CURRENT TREATMENT: Adjuvant chemotherapy  BREAST CANCER HISTORY: From the original intake note 01/18/2014:  The patient had routine bilateral screening mammography at the breast Center for 10/28/2013 showing a possible mass in the right breast. Additional views and right breast ultrasonography 12/20/2013 confirmed an irregular 1.3 cm right breast mass at the 6:00 position. This was not palpable. Ultrasound showed an irregular hypoechoic mass in the right breast measuring 1.4 cm. There was no right axillary adenopathy identified.  On 12/29/2013 the patient underwent right breast biopsy, showing ([id number]) and invasive ductal carcinoma, grade 3, estrogen and progesterone receptor negative, HER-2 not amplified with a signals ratio of 1.35 and a copy number per cell of 2.10, and an MIB-1 of 80%.  On 01/12/2014 the patient underwent bilateral breast MRI. This showed the mass in the right breast lower outer quadrant to measure 2.2 cm maximally. There was a 2 cm well circumscribed mass in the medial right breast which has been stable since 2011 him a and a 1 cm oval mass in the upper midline of the right breast, likely an intramammary lymph node, with benign features. A right axillary lymph node with cortical thickening measured 0.9 cm. The fatty hilum was possibly effaced; note Right axilla had been benign by [place] before biopsy. In the left breast there were no findings of concern.  The patient's subsequent history is as detailed below.  INTERVAL HISTORY: [name] is here for follow up of her breast cancer. Today is day 1, cycle 4 of 12 weekly planned Abraxane and Carboplatin. [name] states that she is tolerating her treatments well.  She  tends to get nauseated on days 3 and 4, but this is handled by prochlorperazine and ondansetron. The neuropathy in her her fingers has subsided, but she still feels it intermittently in her feet. She has had diarrhea up to 3 times per day and has been using imodium, but it has not been very helpful. Otherwise, she has no pain, fevers, chills, rash, or mouth sores.   REVIEW OF SYSTEMS: A 10 point review of systems was conducted and is otherwise negative except for what is noted above.     PAST MEDICAL HISTORY: Past Medical History  Diagnosis Date  . HTN (hypertension)   . Diabetes mellitus   . High cholesterol   . Breast cancer   . Family history of malignant neoplasm of breast   . Glaucoma   . Wears glasses   . Wears dentures     full top-partial bottom    PAST SURGICAL HISTORY: Past Surgical History  Procedure Laterality Date  . Transphenoidal pituitary resection  2006  . Abdominal hysterectomy  1993  . Breast biopsy  2000    LEFT  . Tonsillectomy    . Portacath placement Left 02/08/2014    Procedure: INSERTION PORT-A-CATH;  Surgeon: Rolm Bookbinder, MD;  Location: [facility];  Service: General;  Laterality: Left;    FAMILY HISTORY Family History  Problem Relation Age of Onset  . Hypertension Mother   . Diabetes type II Mother   . Breast cancer Mother     dx ~59; deceased 67  . Hypertension Father   . Breast cancer Maternal Aunt     deceased 64  .  Cancer Maternal Uncle     unk. type; deceased 44s  . Cancer Maternal Uncle     unk. type; deceased late 61s  . Uterine cancer Cousin 40    daughter of an unaffected mat aunt who is 73   the patient's father is in his early 74s. The patient's mother died at the age of 13, from complications of breast cancer which had been diagnosed "years before". The patient had 2 brothers, 2 sisters. The only other breast cancer in the family was a maternal aunt who died from breast cancer at the age of 36. (This means that 2  of 3 sisters, including the patient's mother, had breast cancer, one of them before the age of 37). There is no history of ovarian cancer in the family.  GYNECOLOGIC HISTORY:  Menarche age 52, first live birth age 77. The patient is GX P1. She underwent total abdominal hysterectomy with bilateral salpingo-oophorectomy at age 23. She took hormone replacement for one or 2 months old.  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 The patient is a widow. She lives by herself, with no pets. Her daughter [name] lives in [place], where she works in a Geneticist, molecular. The patient has no grandchildren. She is a Psychologist, forensic   ADVANCED DIRECTIVES: Not in place; advanced directives were discussed with the patient at her initial visit 01/18/2014. She intends to name her daughter as healthcare power of attorney. [name] can be reached at [phone]   HEALTH MAINTENANCE: History  Substance Use Topics  . Smoking status: Former Smoker    Types: Cigarettes    Quit date: 01/31/1994  . Smokeless tobacco: Not on file  . Alcohol Use: Yes     Comment: occ     Colonoscopy:  PAP: Status post hysterectomy  Bone density: Remote  Lipid panel:  No Known Allergies  Current Outpatient Prescriptions  Medication Sig Dispense Refill  . amLODipine (NORVASC) 10 MG tablet TAKE ONE TABLET BY MOUTH ONCE DAILY      . Atorvastatin Calcium (LIPITOR PO) Take 40 mg by mouth daily.       Marland Kitchen dexamethasone (DECADRON) 0.1 % ophthalmic solution Place 1 drop into both eyes daily.      Marland Kitchen glucose monitoring kit (FREESTYLE) monitoring kit One Touch Ultra machine and strips, patient tests her blood sugars bid      . glyBURIDE (DIABETA) 5 MG tablet Take 5 mg by mouth daily with breakfast.       . latanoprost  (XALATAN) 0.005 % ophthalmic solution Place 1 drop into both eyes at bedtime.      Marland Kitchen lisinopril-hydrochlorothiazide (PRINZIDE,ZESTORETIC) 20-12.5 MG per tablet Take 2 tablets by mouth daily.       Marland Kitchen LORazepam (ATIVAN) 0.5 MG tablet Take 1 tablet (0.5 mg total) by mouth at bedtime as needed (Nausea or vomiting).  30 tablet  0  . metFORMIN (GLUCOPHAGE-XR) 500 MG 24 hr tablet Take 500 mg by mouth 2 (two) times daily.       . ondansetron (ZOFRAN) 8 MG tablet Take 1 tablet (8 mg total) by mouth every 8 (eight) hours as needed for nausea or vomiting.  60 tablet  2  . tiZANidine (ZANAFLEX) 4 MG tablet 4 mg at bedtime.       Marland Kitchen  oxyCODONE-acetaminophen (PERCOCET) 10-325 MG per tablet Take 1 tablet by mouth every 6 (six) hours as needed for pain.  20 tablet  0   No current facility-administered medications for this visit.    OBJECTIVE: Middle-aged [place] American woman who appears stated age. She is obese not in acute distress  Filed Vitals:   05/26/14 1345  BP: 160/80  Pulse: 108  Temp: 98.3 F (36.8 C)  Resp: 18     Body mass index is 41.77 kg/(m^2).     GENERAL: Patient is a well appearing female in no acute distress Skin: warm, dry  HEENT: sclerae anicteric, conjunctivae pink, oropharynx clear. No thrush or mucositis.  Lymph Nodes: No cervical or supraclavicular lymphadenopathy  Lungs: clear to auscultation bilaterally, no rales, wheezes, or rhonci  Heart: regular rate and rhythm  Abdomen: round, soft, non tender, positive bowel sounds  Musculoskeletal: No focal spinal tenderness, no peripheral edema  Neuro: non focal, well oriented, positive affect  Breasts: deferred  ECOG FS:1 - Symptomatic but completely ambulatory   LAB RESULTS:  CMP  I No results found for this basename: SPEP,  UPEP,   kappa and lambda light chains    Lab Results  Component Value Date   WBC 3.1* 05/26/2014   NEUTROABS 2.5 05/26/2014   HGB 8.1* 05/26/2014   HCT 25.0* 05/26/2014   MCV 83.7 05/26/2014   PLT  172 05/26/2014      Chemistry      Component Value Date/Time   NA 136 05/26/2014 1254   NA 141 02/03/2014 1400   K 3.9 05/26/2014 1254   K 4.4 02/03/2014 1400   CL 100 02/03/2014 1400   CO2 18* 05/26/2014 1254   CO2 24 02/03/2014 1400   BUN 20.6 05/26/2014 1254   BUN 20 02/03/2014 1400   CREATININE 1.2* 05/26/2014 1254   CREATININE 1.16* 02/03/2014 1400   GLU 353* 04/07/2014 1113      Component Value Date/Time   CALCIUM 9.1 05/26/2014 1254   CALCIUM 10.0 02/03/2014 1400   ALKPHOS 125 05/26/2014 1254   ALKPHOS 114 02/03/2014 1400   AST 26 05/26/2014 1254   AST 15 02/03/2014 1400   ALT 33 05/26/2014 1254   ALT 15 02/03/2014 1400   BILITOT 0.48 05/26/2014 1254   BILITOT 0.3 02/03/2014 1400       Lab Results  Component Value Date   LABCA2 51* 02/03/2014    No components found with this basename: LABCA125    No results found for this basename: INR,  in the last 168 hours  Urinalysis No results found for this basename: colorurine,  appearanceur,  labspec,  phurine,  glucoseu,  hgbur,  bilirubinur,  ketonesur,  proteinur,  urobilinogen,  nitrite,  leukocytesur    STUDIES: No results found.  ASSESSMENT: 58 y.o. BRCA negative  woman status post right breast biopsy 12/29/2013 for a clinical T2 N1, stage IIB invasive ductal carcinoma, grade 3, triple negative, with an MIB-1 of 80%.  (1) status post right lumpectomy and sentinel lymph node sampling 02/08/2014 for a pT2 pN0, stage IIA invasive ductal carcinoma, grade 3, repeat prognostic panel again triple negative  (2) to start adjuvant chemotherapy 03/02/2014, initially doxorubicin and cyclophosphamide in dose dense fashion x4 with Neulasta day 2, to be followed by carboplatin and paclitaxel weekly x12  (3) adjuvant radiation to follow chemotherapy  (4) status post remote TAH BSO   (5) Status post removal of a pituitary adenoma 09/05/2005, no malignancy identified  (6) Uncontrolled hypertension  (7)  Anemia  PLAN: [name] is  doing well today. [name]'s blood pressure continues to be elevated during visits to this office, but she states that during her PCP visit her blood pressure was in the 130s/80s and that no med changes were needed. We will continue to monitor this measure. I counciled her again on her anti-emetic options for post treatment nausea. The labs were reviewed in detail with her. Her blood glucose is much improved, and she states that she has made a consistent effort in changing her diet for the better. She was not able to pick up the blood glucose monitor that was made available by her PCP in time, but states that she will work on finding a battery to the monitor that she already owns.   She continues to have chemotherapy induced anemia, but she is asymptomatic, denying SOB, fatigue, or dizziness. I consulted with Dr. Lindi Adie and he agrees that she could continue with treatment today. She will return on Monday to have a CBC redrawn in case a blood transfusion will be necessary before her next dose of chemo.   [name] will return in one week for labs, an office visit, and the start of cycle 4. She understands and agrees with this plan. She has been encouraged to call if any issues arise before her next visit here.   Marcelino Duster, [place] [phone] 05/26/2014 3:17 PM

## 2014-05-26 NOTE — Patient Instructions (Signed)
[facility] Discharge Instructions for Patients Receiving Chemotherapy  Today you received the following chemotherapy agents  Abraxane and Carboplatin  To help prevent nausea and vomiting after your treatment, we encourage you to take your nausea medication  Zofran as directed If you develop nausea and vomiting that is not controlled by your nausea medication, call the clinic.   BELOW ARE SYMPTOMS THAT SHOULD BE REPORTED IMMEDIATELY:  *FEVER GREATER THAN 100.5 F  *CHILLS WITH OR WITHOUT FEVER  NAUSEA AND VOMITING THAT IS NOT CONTROLLED WITH YOUR NAUSEA MEDICATION  *UNUSUAL SHORTNESS OF BREATH  *UNUSUAL BRUISING OR BLEEDING  TENDERNESS IN MOUTH AND THROAT WITH OR WITHOUT PRESENCE OF ULCERS  *URINARY PROBLEMS  *BOWEL PROBLEMS  UNUSUAL RASH Items with * indicate a potential emergency and should be followed up as soon as possible.  Feel free to call the clinic you have any questions or concerns. The clinic phone number is [phone].    

## 2014-05-26 NOTE — Telephone Encounter (Deleted)
per 9/18 pof - no avail w/LC 9/25 or 9/24

## 2014-05-29 ENCOUNTER — Ambulatory Visit (HOSPITAL_COMMUNITY)
Admission: RE | Admit: 2014-05-29 | Discharge: 2014-05-29 | Disposition: A | Discharge status: Home / Self Care | Payer: BC Managed Care – PPO | Source: Ambulatory Visit | Attending: Oncology | Admitting: Oncology

## 2014-05-29 ENCOUNTER — Telehealth: Payer: Self-pay | Admitting: *Deleted

## 2014-05-29 ENCOUNTER — Other Ambulatory Visit (HOSPITAL_BASED_OUTPATIENT_CLINIC_OR_DEPARTMENT_OTHER): Payer: BC Managed Care – PPO

## 2014-05-29 ENCOUNTER — Other Ambulatory Visit: Payer: Self-pay | Admitting: Nurse Practitioner

## 2014-05-29 DIAGNOSIS — T451X5A Adverse effect of antineoplastic and immunosuppressive drugs, initial encounter: Principal | ICD-10-CM

## 2014-05-29 DIAGNOSIS — D6481 Anemia due to antineoplastic chemotherapy: Secondary | ICD-10-CM | POA: Diagnosis present

## 2014-05-29 DIAGNOSIS — C50919 Malignant neoplasm of unspecified site of unspecified female breast: Secondary | ICD-10-CM

## 2014-05-29 LAB — CBC WITH DIFFERENTIAL/PLATELET
BASO%: 0.4 % (ref 0.0–2.0)
BASOS ABS: 0 10*3/uL (ref 0.0–0.1)
EOS%: 0.4 % (ref 0.0–7.0)
Eosinophils Absolute: 0 10*3/uL (ref 0.0–0.5)
HCT: 23.3 % — ABNORMAL LOW (ref 34.8–46.6)
HEMOGLOBIN: 7.5 g/dL — AB (ref 11.6–15.9)
LYMPH#: 0.5 10*3/uL — AB (ref 0.9–3.3)
LYMPH%: 19.2 % (ref 14.0–49.7)
MCH: 26.7 pg (ref 25.1–34.0)
MCHC: 32.2 g/dL (ref 31.5–36.0)
MCV: 82.9 fL (ref 79.5–101.0)
MONO#: 0.2 10*3/uL (ref 0.1–0.9)
MONO%: 6.4 % (ref 0.0–14.0)
NEUT#: 1.7 10*3/uL (ref 1.5–6.5)
NEUT%: 73.6 % (ref 38.4–76.8)
Platelets: 111 10*3/uL — ABNORMAL LOW (ref 145–400)
RBC: 2.81 10*6/uL — ABNORMAL LOW (ref 3.70–5.45)
RDW: 18.3 % — ABNORMAL HIGH (ref 11.2–14.5)
WBC: 2.3 10*3/uL — AB (ref 3.9–10.3)
nRBC: 0 % (ref 0–0)

## 2014-05-29 LAB — PREPARE RBC (CROSSMATCH)

## 2014-05-29 LAB — HOLD TUBE, BLOOD BANK

## 2014-05-29 NOTE — Telephone Encounter (Signed)
Message copied by Harmon Pier on Mon May 29, 2014  6:17 PM ------      Message from: Marcelino Duster      Created: Mon May 29, 2014  2:19 PM       Please call patient and alert her that her anemia is worse. She will need an infusion appointment for 2 units PRBCs. I am placing the order now and creating a POF now. ------

## 2014-05-29 NOTE — Telephone Encounter (Signed)
After many calls , pt has decided she can come tomorrow 05/30/14 @ 9:00a for blood transfusion as scheduled. She was able to move a prior work appt. Message to be forwarded to Susanne Borders, NP.

## 2014-05-30 ENCOUNTER — Ambulatory Visit (HOSPITAL_BASED_OUTPATIENT_CLINIC_OR_DEPARTMENT_OTHER): Payer: BC Managed Care – PPO

## 2014-05-30 VITALS — BP 161/92 | HR 90 | Temp 98.7°F | Resp 16

## 2014-05-30 DIAGNOSIS — D6481 Anemia due to antineoplastic chemotherapy: Secondary | ICD-10-CM | POA: Diagnosis not present

## 2014-05-30 DIAGNOSIS — T451X5A Adverse effect of antineoplastic and immunosuppressive drugs, initial encounter: Principal | ICD-10-CM

## 2014-05-30 DIAGNOSIS — D649 Anemia, unspecified: Secondary | ICD-10-CM

## 2014-05-30 LAB — PREPARE RBC (CROSSMATCH)

## 2014-05-30 MED ORDER — SODIUM CHLORIDE 0.9 % IJ SOLN
10.0000 mL | INTRAMUSCULAR | Status: AC | PRN
  Administered 2014-05-30: 10 mL
  Filled 2014-05-30: qty 10

## 2014-05-30 MED ORDER — SODIUM CHLORIDE 0.9 % IV SOLN
250.0000 mL | Freq: Once | INTRAVENOUS | Status: AC
  Administered 2014-05-30: 250 mL via INTRAVENOUS

## 2014-05-30 MED ORDER — HEPARIN SOD (PORK) LOCK FLUSH 100 UNIT/ML IV SOLN
250.0000 [IU] | INTRAVENOUS | Status: DC | PRN
  Filled 2014-05-30: qty 5

## 2014-05-30 MED ORDER — HEPARIN SOD (PORK) LOCK FLUSH 100 UNIT/ML IV SOLN
500.0000 [IU] | Freq: Every day | INTRAVENOUS | Status: AC | PRN
Start: 2014-05-30 — End: 2014-05-30
  Administered 2014-05-30: 500 [IU]
  Filled 2014-05-30: qty 5

## 2014-05-30 NOTE — Patient Instructions (Signed)

## 2014-05-31 LAB — TYPE AND SCREEN
ABO/RH(D): A POS
Antibody Screen: NEGATIVE
UNIT DIVISION: 0
Unit division: 0

## 2014-06-01 ENCOUNTER — Other Ambulatory Visit (HOSPITAL_BASED_OUTPATIENT_CLINIC_OR_DEPARTMENT_OTHER): Payer: BC Managed Care – PPO

## 2014-06-01 ENCOUNTER — Ambulatory Visit (HOSPITAL_BASED_OUTPATIENT_CLINIC_OR_DEPARTMENT_OTHER): Payer: BC Managed Care – PPO | Admitting: Nurse Practitioner

## 2014-06-01 ENCOUNTER — Encounter: Payer: Self-pay | Admitting: Nurse Practitioner

## 2014-06-01 VITALS — BP 160/86 | HR 91 | Temp 98.7°F | Resp 18 | Ht 65.0 in | Wt 249.2 lb

## 2014-06-01 DIAGNOSIS — D649 Anemia, unspecified: Secondary | ICD-10-CM

## 2014-06-01 DIAGNOSIS — C50511 Malignant neoplasm of lower-outer quadrant of right female breast: Secondary | ICD-10-CM

## 2014-06-01 DIAGNOSIS — C50919 Malignant neoplasm of unspecified site of unspecified female breast: Secondary | ICD-10-CM

## 2014-06-01 DIAGNOSIS — D6481 Anemia due to antineoplastic chemotherapy: Secondary | ICD-10-CM

## 2014-06-01 DIAGNOSIS — D696 Thrombocytopenia, unspecified: Secondary | ICD-10-CM

## 2014-06-01 DIAGNOSIS — I10 Essential (primary) hypertension: Secondary | ICD-10-CM

## 2014-06-01 DIAGNOSIS — T451X5A Adverse effect of antineoplastic and immunosuppressive drugs, initial encounter: Secondary | ICD-10-CM

## 2014-06-01 DIAGNOSIS — Z171 Estrogen receptor negative status [ER-]: Secondary | ICD-10-CM

## 2014-06-01 LAB — CBC WITH DIFFERENTIAL/PLATELET
BASO%: 0.4 % (ref 0.0–2.0)
Basophils Absolute: 0 10*3/uL (ref 0.0–0.1)
EOS%: 0.9 % (ref 0.0–7.0)
Eosinophils Absolute: 0 10*3/uL (ref 0.0–0.5)
HCT: 29.8 % — ABNORMAL LOW (ref 34.8–46.6)
HGB: 10 g/dL — ABNORMAL LOW (ref 11.6–15.9)
LYMPH%: 15 % (ref 14.0–49.7)
MCH: 27.5 pg (ref 25.1–34.0)
MCHC: 33.6 g/dL (ref 31.5–36.0)
MCV: 82.1 fL (ref 79.5–101.0)
MONO#: 0.1 10*3/uL (ref 0.1–0.9)
MONO%: 4.4 % (ref 0.0–14.0)
NEUT#: 1.8 10*3/uL (ref 1.5–6.5)
NEUT%: 79.3 % — ABNORMAL HIGH (ref 38.4–76.8)
NRBC: 1 % — AB (ref 0–0)
PLATELETS: 64 10*3/uL — AB (ref 145–400)
RBC: 3.63 10*6/uL — AB (ref 3.70–5.45)
RDW: 17.3 % — ABNORMAL HIGH (ref 11.2–14.5)
WBC: 2.3 10*3/uL — ABNORMAL LOW (ref 3.9–10.3)
lymph#: 0.3 10*3/uL — ABNORMAL LOW (ref 0.9–3.3)

## 2014-06-01 LAB — COMPREHENSIVE METABOLIC PANEL (CC13)
ALK PHOS: 132 U/L (ref 40–150)
ALT: 26 U/L (ref 0–55)
AST: 19 U/L (ref 5–34)
Albumin: 3.9 g/dL (ref 3.5–5.0)
Anion Gap: 10 mEq/L (ref 3–11)
BILIRUBIN TOTAL: 0.8 mg/dL (ref 0.20–1.20)
BUN: 18.9 mg/dL (ref 7.0–26.0)
CO2: 21 mEq/L — ABNORMAL LOW (ref 22–29)
CREATININE: 1.1 mg/dL (ref 0.6–1.1)
Calcium: 9.2 mg/dL (ref 8.4–10.4)
Chloride: 108 mEq/L (ref 98–109)
Glucose: 170 mg/dl — ABNORMAL HIGH (ref 70–140)
Potassium: 4.6 mEq/L (ref 3.5–5.1)
Sodium: 139 mEq/L (ref 136–145)
Total Protein: 7.4 g/dL (ref 6.4–8.3)

## 2014-06-01 NOTE — Progress Notes (Signed)
[name]  Telephone:([phone]) [phone] Fax:([phone]) [phone]     ID: [name] OB: 06-11-1956  MR#: [id number]  DEY#:[id number]  PCP: Chesley Noon, MD GYN:   SU: Rolm Bookbinder OTHER MD:  CHIEF COMPLAINT: Triple negative breast cancer  CURRENT TREATMENT: Adjuvant chemotherapy  BREAST CANCER HISTORY: From the original intake note 01/18/2014:  The patient had routine bilateral screening mammography at the breast Center for 10/28/2013 showing a possible mass in the right breast. Additional views and right breast ultrasonography 12/20/2013 confirmed an irregular 1.3 cm right breast mass at the 6:00 position. This was not palpable. Ultrasound showed an irregular hypoechoic mass in the right breast measuring 1.4 cm. There was no right axillary adenopathy identified.  On 12/29/2013 the patient underwent right breast biopsy, showing ([id number]) and invasive ductal carcinoma, grade 3, estrogen and progesterone receptor negative, HER-2 not amplified with a signals ratio of 1.35 and a copy number per cell of 2.10, and an MIB-1 of 80%.  On 01/12/2014 the patient underwent bilateral breast MRI. This showed the mass in the right breast lower outer quadrant to measure 2.2 cm maximally. There was a 2 cm well circumscribed mass in the medial right breast which has been stable since 2011 him a and a 1 cm oval mass in the upper midline of the right breast, likely an intramammary lymph node, with benign features. A right axillary lymph node with cortical thickening measured 0.9 cm. The fatty hilum was possibly effaced; note Right axilla had been benign by [place] before biopsy. In the left breast there were no findings of concern.  The patient's subsequent history is as detailed below.  INTERVAL HISTORY: [name] is here for follow up of her breast cancer. Today is day 1, cycle 5 of 12 weekly planned Abraxane and Carboplatin. The interval history is significant for 2 units of PRBCs given  this Wednesday. Labs were checked earlier in the week based off of the notion that she would indeed be symptomatically anemic after her last dose of chemo when her hemoglobin was already 8.1. The transfusion went well with no complications.   REVIEW OF SYSTEMS: [name] denies fevers or chills. She had some nausea on days 3 and 4, but this is handled by prochlorperazine and ondansetron. Her diarrhea is minimal, but does occur after most treatments. 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 She has no shortness of breath, chest pain, palpitations, cough, or fatigue. A detailed review of systems is otherwise negative.    PAST MEDICAL HISTORY: Past Medical History  Diagnosis Date  . HTN (hypertension)   . Diabetes mellitus   . High cholesterol   . Breast cancer   . Family history of malignant neoplasm of breast   . Glaucoma   . Wears glasses   . Wears dentures     full top-partial bottom    PAST SURGICAL HISTORY: Past Surgical History  Procedure Laterality Date  . Transphenoidal pituitary resection  2006  . Abdominal hysterectomy  1993  . Breast biopsy  2000    LEFT  . Tonsillectomy    . Portacath placement Left 02/08/2014    Procedure: INSERTION PORT-A-CATH;  Surgeon: Rolm Bookbinder, MD;  Location: [facility];  Service: General;  Laterality: Left;    FAMILY HISTORY Family History  Problem Relation Age of Onset  . Hypertension Mother   . Diabetes type II Mother   . Breast cancer Mother  dx ~72; deceased 83  . Hypertension Father   . Breast cancer Maternal Aunt     deceased 91  . Cancer Maternal Uncle     unk. type; deceased 43s  . Cancer Maternal Uncle     unk. type; deceased late 28s  . Uterine cancer Cousin 26    daughter of an unaffected mat aunt who is 32   the patient's father is in his early 42s. The patient's mother died at the age of 20, from complications of breast cancer which had been diagnosed  "years before". The patient had 2 brothers, 2 sisters. The only other breast cancer in the family was a maternal aunt who died from breast cancer at the age of 89. (This means that 2 of 3 sisters, including the patient's mother, had breast cancer, one of them before the age of 13). There is no history of ovarian cancer in the family.  GYNECOLOGIC HISTORY:  Menarche age 37, first live birth age 16. The patient is GX P1. She underwent total abdominal hysterectomy with bilateral salpingo-oophorectomy at age 30. She took hormone replacement for one or 2 months old.  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 The patient is a widow. She lives by herself, with no pets. Her daughter [name] lives in [place], where she works in a Geneticist, molecular. The patient has no grandchildren. She is a Psychologist, forensic   ADVANCED DIRECTIVES: Not in place; advanced directives were discussed with the patient at her initial visit 01/18/2014. She intends to name her daughter as healthcare power of attorney. [name] can be reached at [phone]   HEALTH MAINTENANCE: History  Substance Use Topics  . Smoking status: Former Smoker    Types: Cigarettes    Quit date: 01/31/1994  . Smokeless tobacco: Not on file  . Alcohol Use: Yes     Comment: occ     Colonoscopy:  PAP: Status post hysterectomy  Bone density: Remote  Lipid panel:  No Known Allergies  Current Outpatient Prescriptions  Medication Sig Dispense Refill  . ALPRAZolam (XANAX) 0.5 MG tablet Take 0.5 mg by mouth.      Marland Kitchen amLODipine (NORVASC) 10 MG tablet TAKE ONE TABLET BY MOUTH ONCE DAILY      . Atorvastatin Calcium (LIPITOR PO) Take 40 mg by mouth daily.       Marland Kitchen dexamethasone (DECADRON) 0.1 % ophthalmic solution Place 1 drop into both  eyes daily.      Marland Kitchen dexamethasone (DECADRON) 0.1 % ophthalmic solution Apply to eye.      Marland Kitchen glucose monitoring kit (FREESTYLE) monitoring kit One Touch Ultra machine and strips, patient tests her blood sugars bid      . glyBURIDE (DIABETA) 5 MG tablet Take 5 mg by mouth daily with breakfast.       . glyBURIDE (DIABETA) 5 MG tablet TAKE ONE TABLET BY MOUTH BID as directed      . latanoprost (XALATAN) 0.005 % ophthalmic solution Place 1 drop into both eyes at bedtime.      . lidocaine-prilocaine (EMLA) cream Apply topically.      Marland Kitchen lisinopril-hydrochlorothiazide (PRINZIDE,ZESTORETIC) 20-12.5 MG per tablet Take 2 tablets by mouth daily.       Marland Kitchen LORazepam (ATIVAN) 0.5 MG tablet Take 1 tablet (0.5 mg total) by  mouth at bedtime as needed (Nausea or vomiting).  30 tablet  0  . metFORMIN (GLUCOPHAGE-XR) 500 MG 24 hr tablet Take 500 mg by mouth 2 (two) times daily.       . metFORMIN (GLUCOPHAGE-XR) 500 MG 24 hr tablet TAKE TWO TABLETS BY MOUTH ONCE DAILY      . ondansetron (ZOFRAN) 8 MG tablet Take 1 tablet (8 mg total) by mouth every 8 (eight) hours as needed for nausea or vomiting.  60 tablet  2  . ONE TOUCH ULTRA TEST test strip       . oxyCODONE-acetaminophen (PERCOCET) 10-325 MG per tablet Take 1 tablet by mouth every 6 (six) hours as needed for pain.  20 tablet  0  . tiZANidine (ZANAFLEX) 4 MG tablet 4 mg at bedtime.        No current facility-administered medications for this visit.    OBJECTIVE: Middle-aged [place] American woman who appears stated age. She is obese not in acute distress  Filed Vitals:   06/01/14 1525  BP: 160/86  Pulse: 91  Temp: 98.7 F (37.1 C)  Resp: 18     Body mass index is 41.47 kg/(m^2).    Skin warm, dry, the hyperpigmentation to her palms is clearing Sclerae unicteric, pupils equal and reactive Oropharynx clear and moist-- no thrush No cervical or supraclavicular adenopathy Lungs no rales or rhonchi Heart regular rate and rhythm Abd soft, nontender,  positive bowel sounds MSK no focal spinal tenderness, no upper extremity lymphedema Neuro: nonfocal, well oriented, appropriate affect Breasts: deferred  ECOG FS:1 - Symptomatic but completely ambulatory   LAB RESULTS:  CMP  I No results found for this basename: SPEP,  UPEP,   kappa and lambda light chains    Lab Results  Component Value Date   WBC 2.3* 06/01/2014   NEUTROABS 1.8 06/01/2014   HGB 10.0* 06/01/2014   HCT 29.8* 06/01/2014   MCV 82.1 06/01/2014   PLT 64* 06/01/2014      Chemistry      Component Value Date/Time   NA 139 06/01/2014 1450   NA 141 02/03/2014 1400   K 4.6 06/01/2014 1450   K 4.4 02/03/2014 1400   CL 100 02/03/2014 1400   CO2 21* 06/01/2014 1450   CO2 24 02/03/2014 1400   BUN 18.9 06/01/2014 1450   BUN 20 02/03/2014 1400   CREATININE 1.1 06/01/2014 1450   CREATININE 1.16* 02/03/2014 1400   GLU 353* 04/07/2014 1113      Component Value Date/Time   CALCIUM 9.2 06/01/2014 1450   CALCIUM 10.0 02/03/2014 1400   ALKPHOS 132 06/01/2014 1450   ALKPHOS 114 02/03/2014 1400   AST 19 06/01/2014 1450   AST 15 02/03/2014 1400   ALT 26 06/01/2014 1450   ALT 15 02/03/2014 1400   BILITOT 0.80 06/01/2014 1450   BILITOT 0.3 02/03/2014 1400       Lab Results  Component Value Date   LABCA2 51* 02/03/2014    No components found with this basename: LABCA125    No results found for this basename: INR,  in the last 168 hours  Urinalysis No results found for this basename: colorurine,  appearanceur,  labspec,  phurine,  glucoseu,  hgbur,  bilirubinur,  ketonesur,  proteinur,  urobilinogen,  nitrite,  leukocytesur    STUDIES: No results found.  ASSESSMENT: 58 y.o. BRCA negative [place] woman status post right breast biopsy 12/29/2013 for a clinical T2 N1, stage IIB invasive ductal carcinoma, grade 3, triple negative, with  an MIB-1 of 80%.  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 However, after reviewing the labs in detail, there was a drop in her platelet count to 56 today. Dr. Jana Hakim was made aware that we are holding her chemo today, and that next week a 15% dose reduction in her carboplatin will be needed. Her hbg rose to 10.0 after the blood transfusion. We will continue to monitor her anemia.   Her blood pressure continues to be elevated. But again, [place] says that outside of the [place] she is typically 130s/80s. She will return next week for labs and the restart of cycle 5 of abraxane and carboplatin next week.  She understands and agrees with this plan. She has been encouraged to call if any issues arise before her next visit here.   Marcelino Duster, [place] [phone] 06/01/2014 3:58 PM

## 2014-06-02 ENCOUNTER — Ambulatory Visit: Payer: BC Managed Care – PPO

## 2014-06-09 ENCOUNTER — Ambulatory Visit (HOSPITAL_BASED_OUTPATIENT_CLINIC_OR_DEPARTMENT_OTHER): Payer: BC Managed Care – PPO | Admitting: Adult Health

## 2014-06-09 ENCOUNTER — Other Ambulatory Visit (HOSPITAL_BASED_OUTPATIENT_CLINIC_OR_DEPARTMENT_OTHER): Payer: BC Managed Care – PPO

## 2014-06-09 ENCOUNTER — Ambulatory Visit (HOSPITAL_BASED_OUTPATIENT_CLINIC_OR_DEPARTMENT_OTHER): Payer: BC Managed Care – PPO

## 2014-06-09 ENCOUNTER — Encounter: Payer: Self-pay | Admitting: Adult Health

## 2014-06-09 ENCOUNTER — Telehealth: Payer: Self-pay | Admitting: Oncology

## 2014-06-09 ENCOUNTER — Ambulatory Visit: Payer: BC Managed Care – PPO

## 2014-06-09 VITALS — BP 161/87 | HR 113 | Temp 98.6°F | Resp 18 | Ht 65.0 in | Wt 250.9 lb

## 2014-06-09 DIAGNOSIS — C50511 Malignant neoplasm of lower-outer quadrant of right female breast: Secondary | ICD-10-CM

## 2014-06-09 DIAGNOSIS — C649 Malignant neoplasm of unspecified kidney, except renal pelvis: Secondary | ICD-10-CM

## 2014-06-09 DIAGNOSIS — I10 Essential (primary) hypertension: Secondary | ICD-10-CM

## 2014-06-09 DIAGNOSIS — C50811 Malignant neoplasm of overlapping sites of right female breast: Secondary | ICD-10-CM

## 2014-06-09 DIAGNOSIS — Z171 Estrogen receptor negative status [ER-]: Secondary | ICD-10-CM

## 2014-06-09 DIAGNOSIS — Z08 Encounter for follow-up examination after completed treatment for malignant neoplasm: Secondary | ICD-10-CM

## 2014-06-09 LAB — CBC WITH DIFFERENTIAL/PLATELET
BASO%: 0 % (ref 0.0–2.0)
Basophils Absolute: 0 10*3/uL (ref 0.0–0.1)
EOS%: 1 % (ref 0.0–7.0)
Eosinophils Absolute: 0 10*3/uL (ref 0.0–0.5)
HEMATOCRIT: 27.5 % — AB (ref 34.8–46.6)
HEMOGLOBIN: 9.1 g/dL — AB (ref 11.6–15.9)
LYMPH#: 0.6 10*3/uL — AB (ref 0.9–3.3)
LYMPH%: 28.2 % (ref 14.0–49.7)
MCH: 27.7 pg (ref 25.1–34.0)
MCHC: 33.1 g/dL (ref 31.5–36.0)
MCV: 83.8 fL (ref 79.5–101.0)
MONO#: 0.2 10*3/uL (ref 0.1–0.9)
MONO%: 9.7 % (ref 0.0–14.0)
NEUT#: 1.2 10*3/uL — ABNORMAL LOW (ref 1.5–6.5)
NEUT%: 61.1 % (ref 38.4–76.8)
PLATELETS: 54 10*3/uL — AB (ref 145–400)
RBC: 3.28 10*6/uL — ABNORMAL LOW (ref 3.70–5.45)
RDW: 17.3 % — ABNORMAL HIGH (ref 11.2–14.5)
WBC: 2 10*3/uL — ABNORMAL LOW (ref 3.9–10.3)

## 2014-06-09 LAB — COMPREHENSIVE METABOLIC PANEL (CC13)
ALT: 23 U/L (ref 0–55)
ANION GAP: 11 meq/L (ref 3–11)
AST: 17 U/L (ref 5–34)
Albumin: 3.8 g/dL (ref 3.5–5.0)
Alkaline Phosphatase: 133 U/L (ref 40–150)
BUN: 25.4 mg/dL (ref 7.0–26.0)
CHLORIDE: 108 meq/L (ref 98–109)
CO2: 20 mEq/L — ABNORMAL LOW (ref 22–29)
Calcium: 9.8 mg/dL (ref 8.4–10.4)
Creatinine: 1.3 mg/dL — ABNORMAL HIGH (ref 0.6–1.1)
Glucose: 129 mg/dl (ref 70–140)
Potassium: 4.2 mEq/L (ref 3.5–5.1)
Sodium: 139 mEq/L (ref 136–145)
Total Bilirubin: 0.62 mg/dL (ref 0.20–1.20)
Total Protein: 7.4 g/dL (ref 6.4–8.3)

## 2014-06-09 MED ORDER — TBO-FILGRASTIM 480 MCG/0.8ML ~~LOC~~ SOSY
480.0000 ug | PREFILLED_SYRINGE | Freq: Once | SUBCUTANEOUS | Status: AC
  Administered 2014-06-09: 480 ug via SUBCUTANEOUS
  Filled 2014-06-09: qty 0.8

## 2014-06-09 MED ORDER — FILGRASTIM 480 MCG/0.8ML IJ SOLN
480.0000 ug | Freq: Once | INTRAMUSCULAR | Status: DC
  Filled 2014-06-09: qty 0.8

## 2014-06-09 NOTE — Patient Instructions (Signed)
Filgrastim, G-CSF injection (Neupogen) What is this medicine? FILGRASTIM, G-CSF (fil GRA stim) is a granulocyte colony-stimulating factor that stimulates the growth of neutrophils, a type of white blood cell (WBC) important in the body's fight against infection. It is used to reduce the incidence of fever and infection in patients with certain types of cancer who are receiving chemotherapy that affects the bone marrow, to stimulate blood cell production for removal of WBCs from the body prior to a bone marrow transplantation, to reduce the incidence of fever and infection in patients who have severe chronic neutropenia, and to improve survival outcomes following high-dose radiation exposure that is toxic to the bone marrow. This medicine may be used for other purposes; ask your health care provider or pharmacist if you have questions. COMMON BRAND NAME(S): Neupogen What should I tell my health care provider before I take this medicine? They need to know if you have any of these conditions: -latex allergy -ongoing radiation therapy -sickle cell disease -an unusual or allergic reaction to filgrastim, pegfilgrastim, other medicines, foods, dyes, or preservatives -pregnant or trying to get pregnant -breast-feeding How should I use this medicine? This medicine is for injection under the skin. If you get this medicine at home, you will be taught how to prepare and give this medicine. Refer to the Instructions for Use that come with your medication packaging. Use exactly as directed. Take your medicine at regular intervals. Do not take your medicine more often than directed. It is important that you put your used needles and syringes in a special sharps container. Do not put them in a trash can. If you do not have a sharps container, call your pharmacist or healthcare provider to get one. Talk to your pediatrician regarding the use of this medicine in children. While this drug may be prescribed for children  as young as 7 months for selected conditions, precautions do apply. Overdosage: If you think you have taken too much of this medicine contact a poison control center or emergency room at once. NOTE: This medicine is only for you. Do not share this medicine with others. What if I miss a dose? It is important not to miss your dose. Call your doctor or health care professional if you miss a dose. What may interact with this medicine? This medicine may interact with the following medications: -medicines that may cause a release of neutrophils, such as lithium This list may not describe all possible interactions. Give your health care provider a list of all the medicines, herbs, non-prescription drugs, or dietary supplements you use. Also tell them if you smoke, drink alcohol, or use illegal drugs. Some items may interact with your medicine. What should I watch for while using this medicine? You may need blood work done while you are taking this medicine. What side effects may I notice from receiving this medicine? Side effects that you should report to your doctor or health care professional as soon as possible: -allergic reactions like skin rash, itching or hives, swelling of the face, lips, or tongue -dizziness or feeling faint -fever -pain, redness, or irritation at site where injected -pinpoint red spots on the skin -shortness of breath or breathing problems -stomach or side pain, or pain at the shoulder -swelling -tiredness -trouble passing urine -unusual bleeding or bruising Side effects that usually do not require medical attention (report to your doctor or health care professional if they continue or are bothersome): -bone pain -headache -muscle pain This list may not describe all possible  side effects. Call your doctor for medical advice about side effects. You may report side effects to FDA at 1-800-FDA-1088. Where should I keep my medicine? Keep out of the reach of  children. Store in a refrigerator between 2 and 8 degrees C (36 and 46 degrees F). Do not freeze. Keep in carton to protect from light. Throw away this medicine if vials or syringes are left out of the refrigerator for more than 24 hours. Throw away any unused medicine after the expiration date. NOTE: This sheet is a summary. It may not cover all possible information. If you have questions about this medicine, talk to your doctor, pharmacist, or health care provider.  2015, [employer]vier/[employer] Standard. (2013-12-09 17:00:01)

## 2014-06-09 NOTE — Progress Notes (Signed)
[facility]  Telephone:([phone]) [phone] Fax:(336) [phone]     ID: [name] OB: 13-Sep-1955  MR#: [id number]  EXH#:[id number]  PCP: Chesley Noon, MD GYN:   SU: Rolm Bookbinder OTHER MD:  CHIEF COMPLAINT: Triple negative breast cancer  CURRENT TREATMENT: Adjuvant chemotherapy  BREAST CANCER HISTORY: From the original intake note 01/18/2014:  The patient had routine bilateral screening mammography at the breast Center for 10/28/2013 showing a possible mass in the right breast. Additional views and right breast ultrasonography 12/20/2013 confirmed an irregular 1.3 cm right breast mass at the 6:00 position. This was not palpable. Ultrasound showed an irregular hypoechoic mass in the right breast measuring 1.4 cm. There was no right axillary adenopathy identified.  On 12/29/2013 the patient underwent right breast biopsy, showing ([id number]) and invasive ductal carcinoma, grade 3, estrogen and progesterone receptor negative, HER-2 not amplified with a signals ratio of 1.35 and a copy number per cell of 2.10, and an MIB-1 of 80%.  On 01/12/2014 the patient underwent bilateral breast MRI. This showed the mass in the right breast lower outer quadrant to measure 2.2 cm maximally. There was a 2 cm well circumscribed mass in the medial right breast which has been stable since 2011 him a and a 1 cm oval mass in the upper midline of the right breast, likely an intramammary lymph node, with benign features. A right axillary lymph node with cortical thickening measured 0.9 cm. The fatty hilum was possibly effaced; note Right axilla had been benign by [place] before biopsy. In the left breast there were no findings of concern.  The patient's subsequent history is as detailed below.  INTERVAL HISTORY: [name] is here today for evaluation prior to receiving her weekly Abraxane and Carboplatin.  She is feeling great today.  She does have some mild neuropathy that continues to  improve.  She denies fevers, chills, nausea, vomiting, constipation, diarrhea, easy bruising/bleeding or any concerns.    REVIEW OF SYSTEMS: A 10 point review of systems was conducted and is otherwise negative except for what is noted above.      PAST MEDICAL HISTORY: Past Medical History  Diagnosis Date  . HTN (hypertension)   . Diabetes mellitus   . High cholesterol   . Breast cancer   . Family history of malignant neoplasm of breast   . Glaucoma   . Wears glasses   . Wears dentures     full top-partial bottom    PAST SURGICAL HISTORY: Past Surgical History  Procedure Laterality Date  . Transphenoidal pituitary resection  2006  . Abdominal hysterectomy  1993  . Breast biopsy  2000    LEFT  . Tonsillectomy    . Portacath placement Left 02/08/2014    Procedure: INSERTION PORT-A-CATH;  Surgeon: Rolm Bookbinder, MD;  Location: [facility];  Service: General;  Laterality: Left;    FAMILY HISTORY Family History  Problem Relation Age of Onset  . Hypertension Mother   . Diabetes type II Mother   . Breast cancer Mother     dx ~37; deceased 63  . Hypertension Father   . Breast cancer Maternal Aunt     deceased 77  . Cancer Maternal Uncle     unk. type; deceased 37s  . Cancer Maternal Uncle     unk. type; deceased late 23s  . Uterine cancer Cousin 65    daughter of an unaffected mat aunt who is 75   the patient's father is in his early  12s. The patient's mother died at the age of 19, from complications of breast cancer which had been diagnosed "years before". The patient had 2 brothers, 2 sisters. The only other breast cancer in the family was a maternal aunt who died from breast cancer at the age of 51. (This means that 2 of 3 sisters, including the patient's mother, had breast cancer, one of them before the age of 79). There is no history of ovarian cancer in the family.  GYNECOLOGIC HISTORY:  Menarche age 65, first live birth age 41. The patient is GX P1.  She underwent total abdominal hysterectomy with bilateral salpingo-oophorectomy at age 14. She took hormone replacement for one or 2 months old.  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 The patient is a widow. She lives by herself, with no pets. Her daughter [name] lives in [place], where she works in a Geneticist, molecular. The patient has no grandchildren. She is a Psychologist, forensic   ADVANCED DIRECTIVES: Not in place; advanced directives were discussed with the patient at her initial visit 01/18/2014. She intends to name her daughter as healthcare power of attorney. [name] can be reached at [phone]   HEALTH MAINTENANCE: History  Substance Use Topics  . Smoking status: Former Smoker    Types: Cigarettes    Quit date: 01/31/1994  . Smokeless tobacco: Not on file  . Alcohol Use: Yes     Comment: occ     Colonoscopy:  PAP: Status post hysterectomy  Bone density: Remote  Lipid panel:  No Known Allergies  Current Outpatient Prescriptions  Medication Sig Dispense Refill  . ALPRAZolam (XANAX) 0.5 MG tablet Take 0.5 mg by mouth.      Marland Kitchen amLODipine (NORVASC) 10 MG tablet TAKE ONE TABLET BY MOUTH ONCE DAILY      . Atorvastatin Calcium (LIPITOR PO) Take 40 mg by mouth daily.       Marland Kitchen dexamethasone (DECADRON) 0.1 % ophthalmic solution Place 1 drop into both eyes daily.      Marland Kitchen glucose monitoring kit (FREESTYLE) monitoring kit One Touch Ultra machine and strips, patient tests her blood sugars bid      . glyBURIDE (DIABETA) 5 MG tablet Take 5 mg by mouth daily with breakfast.       . latanoprost (XALATAN) 0.005 % ophthalmic solution Place 1 drop into both eyes at bedtime.      Marland Kitchen lisinopril-hydrochlorothiazide (PRINZIDE,ZESTORETIC) 20-12.5 MG per tablet Take 2 tablets by mouth  daily.       . metFORMIN (GLUCOPHAGE-XR) 500 MG 24 hr tablet Take 500 mg by mouth 2 (two) times daily.       . ONE TOUCH ULTRA TEST test strip       . tiZANidine (ZANAFLEX) 4 MG tablet 4 mg at bedtime.       . lidocaine-prilocaine (EMLA) cream Apply topically.      Marland Kitchen LORazepam (ATIVAN) 0.5 MG tablet Take 1 tablet (0.5 mg total) by mouth at bedtime as needed (Nausea or vomiting).  30 tablet  0  . ondansetron (ZOFRAN) 8 MG tablet Take 1 tablet (8 mg total) by mouth every 8 (eight) hours as needed for nausea or vomiting.  60 tablet  2  . oxyCODONE-acetaminophen (PERCOCET) 10-325 MG per tablet Take 1 tablet by mouth every  6 (six) hours as needed for pain.  20 tablet  0   No current facility-administered medications for this visit.    OBJECTIVE: Middle-aged [place] American woman who appears stated age. She is obese not in acute distress  Filed Vitals:   06/09/14 1335  BP: 161/87  Pulse: 113  Temp: 98.6 F (37 C)  Resp: 18     Body mass index is 41.75 kg/(m^2).    GENERAL: Patient is a well appearing female in no acute distress HEENT:  Sclerae anicteric.  Oropharynx clear and moist. No ulcerations or evidence of oropharyngeal candidiasis. Neck is supple.  NODES:  No cervical, supraclavicular, or axillary lymphadenopathy palpated.  BREAST EXAM:  Deferred. LUNGS:  Clear to auscultation bilaterally.  No wheezes or rhonchi. HEART:  Regular rate and rhythm. No murmur appreciated. ABDOMEN:  Soft, nontender.  Positive, normoactive bowel sounds. No organomegaly palpated. MSK:  No focal spinal tenderness to palpation. Full range of motion bilaterally in the upper extremities. EXTREMITIES:  No peripheral edema.   SKIN:  Clear with no obvious rashes or skin changes. No nail dyscrasia. NEURO:  Nonfocal. Well oriented.  Appropriate affect.    ECOG FS:1 - Symptomatic but completely ambulatory   LAB RESULTS:  CMP  I No results found for this basename: SPEP,  UPEP,   kappa and lambda light  chains    Lab Results  Component Value Date   WBC 2.0* 06/09/2014   NEUTROABS 1.2* 06/09/2014   HGB 9.1* 06/09/2014   HCT 27.5* 06/09/2014   MCV 83.8 06/09/2014   PLT 54* 06/09/2014      Chemistry      Component Value Date/Time   NA 139 06/01/2014 1450   NA 141 02/03/2014 1400   K 4.6 06/01/2014 1450   K 4.4 02/03/2014 1400   CL 100 02/03/2014 1400   CO2 21* 06/01/2014 1450   CO2 24 02/03/2014 1400   BUN 18.9 06/01/2014 1450   BUN 20 02/03/2014 1400   CREATININE 1.1 06/01/2014 1450   CREATININE 1.16* 02/03/2014 1400   GLU 353* 04/07/2014 1113      Component Value Date/Time   CALCIUM 9.2 06/01/2014 1450   CALCIUM 10.0 02/03/2014 1400   ALKPHOS 132 06/01/2014 1450   ALKPHOS 114 02/03/2014 1400   AST 19 06/01/2014 1450   AST 15 02/03/2014 1400   ALT 26 06/01/2014 1450   ALT 15 02/03/2014 1400   BILITOT 0.80 06/01/2014 1450   BILITOT 0.3 02/03/2014 1400       Lab Results  Component Value Date   LABCA2 51* 02/03/2014    No components found with this basename: LABCA125    No results found for this basename: INR,  in the last 168 hours  Urinalysis No results found for this basename: colorurine,  appearanceur,  labspec,  phurine,  glucoseu,  hgbur,  bilirubinur,  ketonesur,  proteinur,  urobilinogen,  nitrite,  leukocytesur    STUDIES: No results found.  ASSESSMENT: 58 y.o. BRCA negative [place] woman status post right breast biopsy 12/29/2013 for a clinical T2 N1, stage IIB invasive ductal carcinoma, grade 3, triple negative, with an MIB-1 of 80%.  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  I reviewed her lab work with her in detail.  She is slightly neutropenic and thormbocytopenic due to chemotherapy.  She will receive Granix x 2 days at 480 mcg, and hopefully her labs will have recovered by next week.  She is discouraged but knows that she cannot receive treatment until her labs fully recover.  I gave her detailed information about the granix.    When she does receive Carboplatin again, Dr. Jana Hakim has ordered a 15% dose reduction to prevent further cytopenias.  [place] will return tomorrow for Granix and in one week for labs and evaluation, along with possible chemotherapy.   She knows to call us in the interim for any questions or concerns.  We can certainly see her sooner if needed.  I spent 25 minutes counseling the patient face to face.  The total time spent in the appointment was 30 minutes.  Minette Headland, [place] [phone]   06/09/2014 1:55 PM

## 2014-06-09 NOTE — Patient Instructions (Signed)
Filgrastim, G-CSF injection  What is this medicine?  FILGRASTIM, G-CSF (fil GRA stim) is a granulocyte colony-stimulating factor that stimulates the growth of neutrophils, a type of white blood cell (WBC) important in the body's fight against infection. It is used to reduce the incidence of fever and infection in patients with certain types of cancer who are receiving chemotherapy that affects the bone marrow, to stimulate blood cell production for removal of WBCs from the body prior to a bone marrow transplantation, to reduce the incidence of fever and infection in patients who have severe chronic neutropenia, and to improve survival outcomes following high-dose radiation exposure that is toxic to the bone marrow.  This medicine may be used for other purposes; ask your health care provider or pharmacist if you have questions.  COMMON BRAND NAME(S): Neupogen  What should I tell my health care provider before I take this medicine?  They need to know if you have any of these conditions:  -latex allergy  -ongoing radiation therapy  -sickle cell disease  -an unusual or allergic reaction to filgrastim, pegfilgrastim, other medicines, foods, dyes, or preservatives  -pregnant or trying to get pregnant  -breast-feeding  How should I use this medicine?  This medicine is for injection under the skin. If you get this medicine at home, you will be taught how to prepare and give this medicine. Refer to the Instructions for Use that come with your medication packaging. Use exactly as directed. Take your medicine at regular intervals. Do not take your medicine more often than directed.  It is important that you put your used needles and syringes in a special sharps container. Do not put them in a trash can. If you do not have a sharps container, call your pharmacist or healthcare provider to get one.  Talk to your pediatrician regarding the use of this medicine in children. While this drug may be prescribed for children as young  as 7 months for selected conditions, precautions do apply.  Overdosage: If you think you have taken too much of this medicine contact a poison control center or emergency room at once.  NOTE: This medicine is only for you. Do not share this medicine with others.  What if I miss a dose?  It is important not to miss your dose. Call your doctor or health care professional if you miss a dose.  What may interact with this medicine?  This medicine may interact with the following medications:  -medicines that may cause a release of neutrophils, such as lithium  This list may not describe all possible interactions. Give your health care provider a list of all the medicines, herbs, non-prescription drugs, or dietary supplements you use. Also tell them if you smoke, drink alcohol, or use illegal drugs. Some items may interact with your medicine.  What should I watch for while using this medicine?  You may need blood work done while you are taking this medicine.  What side effects may I notice from receiving this medicine?  Side effects that you should report to your doctor or health care professional as soon as possible:  -allergic reactions like skin rash, itching or hives, swelling of the face, lips, or tongue  -dizziness or feeling faint  -fever  -pain, redness, or irritation at site where injected  -pinpoint red spots on the skin  -shortness of breath or breathing problems  -stomach or side pain, or pain at the shoulder  -swelling  -tiredness  -trouble passing urine  -unusual   bleeding or bruising  Side effects that usually do not require medical attention (report to your doctor or health care professional if they continue or are bothersome):  -bone pain  -headache  -muscle pain  This list may not describe all possible side effects. Call your doctor for medical advice about side effects. You may report side effects to FDA at 1-800-FDA-1088.  Where should I keep my medicine?  Keep out of the reach of children.  Store in a  refrigerator between 2 and 8 degrees C (36 and 46 degrees F). Do not freeze. Keep in carton to protect from light. Throw away this medicine if vials or syringes are left out of the refrigerator for more than 24 hours. Throw away any unused medicine after the expiration date.  NOTE: This sheet is a summary. It may not cover all possible information. If you have questions about this medicine, talk to your doctor, pharmacist, or health care provider.   2015, [employer]vier/[employer] Standard. (2013-12-09 17:00:01)

## 2014-06-09 NOTE — Telephone Encounter (Signed)
added inj appt for 10/3 and pt given new scehdule - all other remain the same.

## 2014-06-09 NOTE — Progress Notes (Signed)
Per Ria Comment NP, neupogen only today, no chemo. Voices understanding to return tomorrow for neupogen injection.

## 2014-06-10 ENCOUNTER — Ambulatory Visit (HOSPITAL_BASED_OUTPATIENT_CLINIC_OR_DEPARTMENT_OTHER): Payer: BC Managed Care – PPO

## 2014-06-10 VITALS — BP 155/82 | HR 114 | Temp 98.6°F

## 2014-06-10 DIAGNOSIS — C50811 Malignant neoplasm of overlapping sites of right female breast: Secondary | ICD-10-CM

## 2014-06-10 DIAGNOSIS — Z5189 Encounter for other specified aftercare: Secondary | ICD-10-CM

## 2014-06-10 DIAGNOSIS — C50511 Malignant neoplasm of lower-outer quadrant of right female breast: Secondary | ICD-10-CM

## 2014-06-10 MED ORDER — FILGRASTIM 480 MCG/0.8ML IJ SOLN
480.0000 ug | Freq: Once | INTRAMUSCULAR | Status: AC
  Administered 2014-06-10: 480 ug via SUBCUTANEOUS

## 2014-06-16 ENCOUNTER — Other Ambulatory Visit: Payer: Self-pay | Admitting: *Deleted

## 2014-06-16 ENCOUNTER — Encounter: Payer: Self-pay | Admitting: Adult Health

## 2014-06-16 ENCOUNTER — Other Ambulatory Visit (HOSPITAL_BASED_OUTPATIENT_CLINIC_OR_DEPARTMENT_OTHER): Payer: BC Managed Care – PPO

## 2014-06-16 ENCOUNTER — Ambulatory Visit: Payer: BC Managed Care – PPO

## 2014-06-16 ENCOUNTER — Ambulatory Visit (HOSPITAL_BASED_OUTPATIENT_CLINIC_OR_DEPARTMENT_OTHER): Payer: BC Managed Care – PPO

## 2014-06-16 ENCOUNTER — Ambulatory Visit (HOSPITAL_BASED_OUTPATIENT_CLINIC_OR_DEPARTMENT_OTHER): Payer: BC Managed Care – PPO | Admitting: Adult Health

## 2014-06-16 VITALS — BP 146/83 | HR 115 | Temp 98.6°F | Resp 18 | Ht 65.0 in | Wt 250.6 lb

## 2014-06-16 DIAGNOSIS — C50811 Malignant neoplasm of overlapping sites of right female breast: Secondary | ICD-10-CM

## 2014-06-16 DIAGNOSIS — D649 Anemia, unspecified: Secondary | ICD-10-CM

## 2014-06-16 DIAGNOSIS — C50511 Malignant neoplasm of lower-outer quadrant of right female breast: Secondary | ICD-10-CM

## 2014-06-16 DIAGNOSIS — G629 Polyneuropathy, unspecified: Secondary | ICD-10-CM

## 2014-06-16 DIAGNOSIS — Z171 Estrogen receptor negative status [ER-]: Secondary | ICD-10-CM

## 2014-06-16 DIAGNOSIS — I10 Essential (primary) hypertension: Secondary | ICD-10-CM

## 2014-06-16 DIAGNOSIS — Z5111 Encounter for antineoplastic chemotherapy: Secondary | ICD-10-CM

## 2014-06-16 LAB — COMPREHENSIVE METABOLIC PANEL (CC13)
ALBUMIN: 3.9 g/dL (ref 3.5–5.0)
ALT: 33 U/L (ref 0–55)
AST: 27 U/L (ref 5–34)
Alkaline Phosphatase: 139 U/L (ref 40–150)
Anion Gap: 10 mEq/L (ref 3–11)
BUN: 22.1 mg/dL (ref 7.0–26.0)
CALCIUM: 9.5 mg/dL (ref 8.4–10.4)
CHLORIDE: 105 meq/L (ref 98–109)
CO2: 22 meq/L (ref 22–29)
Creatinine: 1.3 mg/dL — ABNORMAL HIGH (ref 0.6–1.1)
GLUCOSE: 164 mg/dL — AB (ref 70–140)
POTASSIUM: 4.3 meq/L (ref 3.5–5.1)
Sodium: 138 mEq/L (ref 136–145)
Total Bilirubin: 0.49 mg/dL (ref 0.20–1.20)
Total Protein: 7.6 g/dL (ref 6.4–8.3)

## 2014-06-16 LAB — CBC WITH DIFFERENTIAL/PLATELET
BASO%: 0.6 % (ref 0.0–2.0)
BASOS ABS: 0 10*3/uL (ref 0.0–0.1)
EOS%: 0.5 % (ref 0.0–7.0)
Eosinophils Absolute: 0 10*3/uL (ref 0.0–0.5)
HCT: 27.1 % — ABNORMAL LOW (ref 34.8–46.6)
HGB: 8.7 g/dL — ABNORMAL LOW (ref 11.6–15.9)
LYMPH#: 0.6 10*3/uL — AB (ref 0.9–3.3)
LYMPH%: 14.9 % (ref 14.0–49.7)
MCH: 27.4 pg (ref 25.1–34.0)
MCHC: 32 g/dL (ref 31.5–36.0)
MCV: 85.7 fL (ref 79.5–101.0)
MONO#: 0.8 10*3/uL (ref 0.1–0.9)
MONO%: 19.3 % — ABNORMAL HIGH (ref 0.0–14.0)
NEUT%: 64.7 % (ref 38.4–76.8)
NEUTROS ABS: 2.7 10*3/uL (ref 1.5–6.5)
Platelets: 170 10*3/uL (ref 145–400)
RBC: 3.17 10*6/uL — ABNORMAL LOW (ref 3.70–5.45)
RDW: 18.8 % — ABNORMAL HIGH (ref 11.2–14.5)
WBC: 4.1 10*3/uL (ref 3.9–10.3)

## 2014-06-16 MED ORDER — ONDANSETRON 16 MG/50ML IVPB (CHCC)
INTRAVENOUS | Status: AC
  Filled 2014-06-16: qty 16

## 2014-06-16 MED ORDER — SODIUM CHLORIDE 0.9 % IV SOLN
Freq: Once | INTRAVENOUS | Status: AC
  Administered 2014-06-16: 14:00:00 via INTRAVENOUS

## 2014-06-16 MED ORDER — PACLITAXEL PROTEIN-BOUND CHEMO INJECTION 100 MG
125.0000 mg/m2 | Freq: Once | INTRAVENOUS | Status: AC
  Administered 2014-06-16: 275 mg via INTRAVENOUS
  Filled 2014-06-16: qty 55

## 2014-06-16 MED ORDER — DIPHENHYDRAMINE HCL 50 MG/ML IJ SOLN
INTRAMUSCULAR | Status: AC
  Filled 2014-06-16: qty 1

## 2014-06-16 MED ORDER — SODIUM CHLORIDE 0.9 % IJ SOLN
10.0000 mL | INTRAMUSCULAR | Status: DC | PRN
  Administered 2014-06-16: 10 mL
  Filled 2014-06-16: qty 10

## 2014-06-16 MED ORDER — HEPARIN SOD (PORK) LOCK FLUSH 100 UNIT/ML IV SOLN
500.0000 [IU] | Freq: Once | INTRAVENOUS | Status: AC | PRN
  Administered 2014-06-16: 500 [IU]
  Filled 2014-06-16: qty 5

## 2014-06-16 MED ORDER — ONDANSETRON 16 MG/50ML IVPB (CHCC)
16.0000 mg | Freq: Once | INTRAVENOUS | Status: AC
  Administered 2014-06-16: 16 mg via INTRAVENOUS

## 2014-06-16 MED ORDER — SODIUM CHLORIDE 0.9 % IV SOLN
179.6800 mg | Freq: Once | INTRAVENOUS | Status: AC
  Administered 2014-06-16: 180 mg via INTRAVENOUS
  Filled 2014-06-16: qty 18

## 2014-06-16 MED ORDER — DIPHENHYDRAMINE HCL 50 MG/ML IJ SOLN
25.0000 mg | Freq: Once | INTRAMUSCULAR | Status: AC
  Administered 2014-06-16: 25 mg via INTRAVENOUS

## 2014-06-16 NOTE — Progress Notes (Signed)
[place]  Telephone:(336) [phone] Fax:(336) [phone]     ID: [name] OB: Nov 25, 1955  MR#: [id number]  PRF#:[id number]  PCP: Chesley Noon, MD GYN:   SU: Rolm Bookbinder OTHER MD:  CHIEF COMPLAINT: Triple negative breast cancer  CURRENT TREATMENT: Adjuvant chemotherapy  BREAST CANCER HISTORY: From the original intake note 01/18/2014:  The patient had routine bilateral screening mammography at the breast Center for 10/28/2013 showing a possible mass in the right breast. Additional views and right breast ultrasonography 12/20/2013 confirmed an irregular 1.3 cm right breast mass at the 6:00 position. This was not palpable. Ultrasound showed an irregular hypoechoic mass in the right breast measuring 1.4 cm. There was no right axillary adenopathy identified.  On 12/29/2013 the patient underwent right breast biopsy, showing ([id number]) and invasive ductal carcinoma, grade 3, estrogen and progesterone receptor negative, HER-2 not amplified with a signals ratio of 1.35 and a copy number per cell of 2.10, and an MIB-1 of 80%.  On 01/12/2014 the patient underwent bilateral breast MRI. This showed the mass in the right breast lower outer quadrant to measure 2.2 cm maximally. There was a 2 cm well circumscribed mass in the medial right breast which has been stable since 2011 him a and a 1 cm oval mass in the upper midline of the right breast, likely an intramammary lymph node, with benign features. A right axillary lymph node with cortical thickening measured 0.9 cm. The fatty hilum was possibly effaced; note Right axilla had been benign by [place] before biopsy. In the left breast there were no findings of concern.  The patient's subsequent history is as detailed below.  INTERVAL HISTORY: [name] is here today for evaluation prior to receiving her weekly Abraxane and Carboplatin.  She is doing well today.  She says her neuroopathy is improving.  She denies fevers,  chills, nausea, vomiting, constipation, diarrhea, skin changes, or nail changes.   REVIEW OF SYSTEMS: A 10 point review of systems was conducted and is otherwise negative except for what is noted above.      PAST MEDICAL HISTORY: Past Medical History  Diagnosis Date  . HTN (hypertension)   . Diabetes mellitus   . High cholesterol   . Breast cancer   . Family history of malignant neoplasm of breast   . Glaucoma   . Wears glasses   . Wears dentures     full top-partial bottom    PAST SURGICAL HISTORY: Past Surgical History  Procedure Laterality Date  . Transphenoidal pituitary resection  2006  . Abdominal hysterectomy  1993  . Breast biopsy  2000    LEFT  . Tonsillectomy    . Portacath placement Left 02/08/2014    Procedure: INSERTION PORT-A-CATH;  Surgeon: Rolm Bookbinder, MD;  Location: [facility];  Service: General;  Laterality: Left;    FAMILY HISTORY Family History  Problem Relation Age of Onset  . Hypertension Mother   . Diabetes type II Mother   . Breast cancer Mother     dx ~62; deceased 27  . Hypertension Father   . Breast cancer Maternal Aunt     deceased 75  . Cancer Maternal Uncle     unk. type; deceased 3s  . Cancer Maternal Uncle     unk. type; deceased late 75s  . Uterine cancer Cousin 86    daughter of an unaffected mat aunt who is 62   the patient's father is in his early 70s. The patient's mother died  at the age of 52, from complications of breast cancer which had been diagnosed "years before". The patient had 2 brothers, 2 sisters. The only other breast cancer in the family was a maternal aunt who died from breast cancer at the age of 9. (This means that 2 of 3 sisters, including the patient's mother, had breast cancer, one of them before the age of 16). There is no history of ovarian cancer in the family.  GYNECOLOGIC HISTORY:  Menarche age 9, first live birth age 17. The patient is GX P1. She underwent total abdominal  hysterectomy with bilateral salpingo-oophorectomy at age 58. She took hormone replacement for one or 2 months old.  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 The patient is a widow. She lives by herself, with no pets. Her daughter [name] lives in [place], where she works in a Geneticist, molecular. The patient has no grandchildren. She is a Psychologist, forensic   ADVANCED DIRECTIVES: Not in place; advanced directives were discussed with the patient at her initial visit 01/18/2014. She intends to name her daughter as healthcare power of attorney. [name] can be reached at [phone]   HEALTH MAINTENANCE: History  Substance Use Topics  . Smoking status: Former Smoker    Types: Cigarettes    Quit date: 01/31/1994  . Smokeless tobacco: Not on file  . Alcohol Use: Yes     Comment: occ     Colonoscopy:  PAP: Status post hysterectomy  Bone density: Remote  Lipid panel:  No Known Allergies  Current Outpatient Prescriptions  Medication Sig Dispense Refill  . ALPRAZolam (XANAX) 0.5 MG tablet Take 0.5 mg by mouth.      Marland Kitchen amLODipine (NORVASC) 10 MG tablet TAKE ONE TABLET BY MOUTH ONCE DAILY      . Atorvastatin Calcium (LIPITOR PO) Take 40 mg by mouth daily.       Marland Kitchen dexamethasone (DECADRON) 0.1 % ophthalmic solution Place 1 drop into both eyes daily.      Marland Kitchen glucose monitoring kit (FREESTYLE) monitoring kit One Touch Ultra machine and strips, patient tests her blood sugars bid      . glyBURIDE (DIABETA) 5 MG tablet Take 5 mg by mouth daily with breakfast.       . latanoprost (XALATAN) 0.005 % ophthalmic solution Place 1 drop into both eyes at bedtime.      . lidocaine-prilocaine (EMLA) cream Apply topically.      Marland Kitchen lisinopril-hydrochlorothiazide (PRINZIDE,ZESTORETIC) 20-12.5 MG per  tablet Take 2 tablets by mouth daily.       Marland Kitchen LORazepam (ATIVAN) 0.5 MG tablet Take 1 tablet (0.5 mg total) by mouth at bedtime as needed (Nausea or vomiting).  30 tablet  0  . metFORMIN (GLUCOPHAGE-XR) 500 MG 24 hr tablet Take 500 mg by mouth 2 (two) times daily.       . ondansetron (ZOFRAN) 8 MG tablet Take 1 tablet (8 mg total) by mouth every 8 (eight) hours as needed for nausea or vomiting.  60 tablet  2  . ONE TOUCH ULTRA TEST test strip       . oxyCODONE-acetaminophen (PERCOCET) 10-325 MG per tablet Take 1 tablet by mouth every 6 (six) hours as needed for pain.  20 tablet  0  . tiZANidine (ZANAFLEX) 4 MG tablet 4 mg  at bedtime.        No current facility-administered medications for this visit.    OBJECTIVE: Middle-aged [place] American woman who appears stated age. She is obese not in acute distress  Filed Vitals:   06/16/14 1320  BP: 146/83  Pulse: 115  Temp: 98.6 F (37 C)  Resp: 18     Body mass index is 41.7 kg/(m^2).    GENERAL: Patient is a well appearing female in no acute distress HEENT:  Sclerae anicteric.  Oropharynx clear and moist. No ulcerations or evidence of oropharyngeal candidiasis. Neck is supple.  NODES:  No cervical, supraclavicular, or axillary lymphadenopathy palpated.  BREAST EXAM:  Deferred. LUNGS:  Clear to auscultation bilaterally.  No wheezes or rhonchi. HEART:  Regular rate and rhythm. No murmur appreciated. ABDOMEN:  Soft, nontender.  Positive, normoactive bowel sounds. No organomegaly palpated. MSK:  No focal spinal tenderness to palpation. Full range of motion bilaterally in the upper extremities. EXTREMITIES:  No peripheral edema.   SKIN:  Clear with no obvious rashes or skin changes. No nail dyscrasia. NEURO:  Nonfocal. Well oriented.  Appropriate affect.    ECOG FS:1 - Symptomatic but completely ambulatory   LAB RESULTS:  CMP  I No results found for this basename: SPEP,  UPEP,   kappa and lambda light chains    Lab Results    Component Value Date   WBC 4.1 06/16/2014   NEUTROABS 2.7 06/16/2014   HGB 8.7* 06/16/2014   HCT 27.1* 06/16/2014   MCV 85.7 06/16/2014   PLT 170 06/16/2014      Chemistry      Component Value Date/Time   NA 139 06/09/2014 1329   NA 141 02/03/2014 1400   K 4.2 06/09/2014 1329   K 4.4 02/03/2014 1400   CL 100 02/03/2014 1400   CO2 20* 06/09/2014 1329   CO2 24 02/03/2014 1400   BUN 25.4 06/09/2014 1329   BUN 20 02/03/2014 1400   CREATININE 1.3* 06/09/2014 1329   CREATININE 1.16* 02/03/2014 1400   GLU 353* 04/07/2014 1113      Component Value Date/Time   CALCIUM 9.8 06/09/2014 1329   CALCIUM 10.0 02/03/2014 1400   ALKPHOS 133 06/09/2014 1329   ALKPHOS 114 02/03/2014 1400   AST 17 06/09/2014 1329   AST 15 02/03/2014 1400   ALT 23 06/09/2014 1329   ALT 15 02/03/2014 1400   BILITOT 0.62 06/09/2014 1329   BILITOT 0.3 02/03/2014 1400       Lab Results  Component Value Date   LABCA2 51* 02/03/2014    No components found with this basename: LABCA125    No results found for this basename: INR,  in the last 168 hours  Urinalysis No results found for this basename: colorurine,  appearanceur,  labspec,  phurine,  glucoseu,  hgbur,  bilirubinur,  ketonesur,  proteinur,  urobilinogen,  nitrite,  leukocytesur    STUDIES: No results found.  ASSESSMENT: 58 y.o. BRCA negative [place] woman status post right breast biopsy 12/29/2013 for a clinical T2 N1, stage IIB invasive ductal carcinoma, grade 3, triple negative, with an MIB-1 of 80%.  (1) status post right lumpectomy and sentinel lymph node sampling 02/08/2014 for a pT2 pN0, stage IIA invasive ductal carcinoma, grade 3, repeat prognostic panel again triple negative  (2) to start adjuvant chemotherapy 03/02/2014, initially doxorubicin and cyclophosphamide in dose dense fashion x4 with Neulasta day 2, to be followed by carboplatin and Abraxane weekly x12,  (3) adjuvant radiation to follow chemotherapy  (  4) status post remote TAH BSO   (5)  Status post removal of a pituitary adenoma 09/05/2005, no malignancy identified  (6) Uncontrolled hypertension  (7) Anemia - last requiring blood transfusion on 05/30/14  PLAN:  Elanora is doing well today.  I reviewed her lab work with her.  Her hemoglobin is slightly decreased and this is due to chemotherapy.  We will continue to monitor this.  She does not need a transfusion at this point.  Her WBC and thrombocytopenia has resolved and she can proceed with chemotherapy.  Her carboplatin will be dose reduced by 15% per Dr. Jana Hakim due to her treatment related cytopenias.  She will not require any granix or neupogen injections following her treatment.    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 We will follow it closely.    Corrin will return in 1 week for labs and evaluation for Abraxane and carboplatin.      She knows to call us in the interim for any questions or concerns.  We can certainly see her sooner if needed.  I spent 25 minutes counseling the patient face to face.  The total time spent in the appointment was 30 minutes.  Minette Headland, [place] [phone]   06/16/2014 1:21 PM

## 2014-06-16 NOTE — Patient Instructions (Signed)
[facility] Discharge Instructions for Patients Receiving Chemotherapy  Today you received the following chemotherapy agents Abraxane/Carboplatin.   To help prevent nausea and vomiting after your treatment, we encourage you to take your nausea medication as directed.    If you develop nausea and vomiting that is not controlled by your nausea medication, call the clinic.   BELOW ARE SYMPTOMS THAT SHOULD BE REPORTED IMMEDIATELY:  *FEVER GREATER THAN 100.5 F  *CHILLS WITH OR WITHOUT FEVER  NAUSEA AND VOMITING THAT IS NOT CONTROLLED WITH YOUR NAUSEA MEDICATION  *UNUSUAL SHORTNESS OF BREATH  *UNUSUAL BRUISING OR BLEEDING  TENDERNESS IN MOUTH AND THROAT WITH OR WITHOUT PRESENCE OF ULCERS  *URINARY PROBLEMS  *BOWEL PROBLEMS  UNUSUAL RASH Items with * indicate a potential emergency and should be followed up as soon as possible.  Feel free to call the clinic you have any questions or concerns. The clinic phone number is [phone].

## 2014-06-23 ENCOUNTER — Other Ambulatory Visit: Payer: Self-pay | Admitting: Oncology

## 2014-06-23 ENCOUNTER — Other Ambulatory Visit (HOSPITAL_BASED_OUTPATIENT_CLINIC_OR_DEPARTMENT_OTHER): Payer: BC Managed Care – PPO

## 2014-06-23 ENCOUNTER — Ambulatory Visit (HOSPITAL_COMMUNITY)
Admission: RE | Admit: 2014-06-23 | Discharge: 2014-06-23 | Disposition: A | Discharge status: Home / Self Care | Payer: BC Managed Care – PPO | Source: Ambulatory Visit | Attending: Oncology | Admitting: Oncology

## 2014-06-23 ENCOUNTER — Ambulatory Visit (HOSPITAL_BASED_OUTPATIENT_CLINIC_OR_DEPARTMENT_OTHER): Payer: BC Managed Care – PPO

## 2014-06-23 ENCOUNTER — Ambulatory Visit: Payer: BC Managed Care – PPO

## 2014-06-23 ENCOUNTER — Encounter: Payer: Self-pay | Admitting: Adult Health

## 2014-06-23 ENCOUNTER — Ambulatory Visit (HOSPITAL_BASED_OUTPATIENT_CLINIC_OR_DEPARTMENT_OTHER): Payer: BC Managed Care – PPO | Admitting: Adult Health

## 2014-06-23 VITALS — BP 143/85 | HR 113 | Temp 98.6°F | Resp 20 | Ht 65.0 in | Wt 249.4 lb

## 2014-06-23 DIAGNOSIS — C50511 Malignant neoplasm of lower-outer quadrant of right female breast: Secondary | ICD-10-CM | POA: Insufficient documentation

## 2014-06-23 DIAGNOSIS — C50811 Malignant neoplasm of overlapping sites of right female breast: Secondary | ICD-10-CM

## 2014-06-23 DIAGNOSIS — Z5111 Encounter for antineoplastic chemotherapy: Secondary | ICD-10-CM

## 2014-06-23 DIAGNOSIS — I10 Essential (primary) hypertension: Secondary | ICD-10-CM

## 2014-06-23 DIAGNOSIS — D6481 Anemia due to antineoplastic chemotherapy: Secondary | ICD-10-CM

## 2014-06-23 DIAGNOSIS — D649 Anemia, unspecified: Secondary | ICD-10-CM | POA: Insufficient documentation

## 2014-06-23 LAB — COMPREHENSIVE METABOLIC PANEL (CC13)
ALT: 32 U/L (ref 0–55)
AST: 23 U/L (ref 5–34)
Albumin: 3.7 g/dL (ref 3.5–5.0)
Alkaline Phosphatase: 133 U/L (ref 40–150)
Anion Gap: 12 mEq/L — ABNORMAL HIGH (ref 3–11)
BILIRUBIN TOTAL: 0.38 mg/dL (ref 0.20–1.20)
BUN: 24.7 mg/dL (ref 7.0–26.0)
CHLORIDE: 109 meq/L (ref 98–109)
CO2: 19 meq/L — AB (ref 22–29)
Calcium: 9.5 mg/dL (ref 8.4–10.4)
Creatinine: 1.2 mg/dL — ABNORMAL HIGH (ref 0.6–1.1)
Glucose: 134 mg/dl (ref 70–140)
Potassium: 4.7 mEq/L (ref 3.5–5.1)
SODIUM: 139 meq/L (ref 136–145)
Total Protein: 7.2 g/dL (ref 6.4–8.3)

## 2014-06-23 LAB — CBC WITH DIFFERENTIAL/PLATELET
BASO%: 0.4 % (ref 0.0–2.0)
Basophils Absolute: 0 10*3/uL (ref 0.0–0.1)
EOS%: 0.7 % (ref 0.0–7.0)
Eosinophils Absolute: 0 10*3/uL (ref 0.0–0.5)
HCT: 24.2 % — ABNORMAL LOW (ref 34.8–46.6)
HGB: 7.8 g/dL — ABNORMAL LOW (ref 11.6–15.9)
LYMPH%: 20.8 % (ref 14.0–49.7)
MCH: 27.3 pg (ref 25.1–34.0)
MCHC: 32.2 g/dL (ref 31.5–36.0)
MCV: 84.6 fL (ref 79.5–101.0)
MONO#: 0.4 10*3/uL (ref 0.1–0.9)
MONO%: 12.4 % (ref 0.0–14.0)
NEUT#: 1.9 10*3/uL (ref 1.5–6.5)
NEUT%: 65.7 % (ref 38.4–76.8)
NRBC: 1 % — AB (ref 0–0)
Platelets: 276 10*3/uL (ref 145–400)
RBC: 2.86 10*6/uL — AB (ref 3.70–5.45)
RDW: 18.9 % — AB (ref 11.2–14.5)
WBC: 2.8 10*3/uL — AB (ref 3.9–10.3)
lymph#: 0.6 10*3/uL — ABNORMAL LOW (ref 0.9–3.3)

## 2014-06-23 MED ORDER — HEPARIN SOD (PORK) LOCK FLUSH 100 UNIT/ML IV SOLN
500.0000 [IU] | Freq: Once | INTRAVENOUS | Status: AC | PRN
  Administered 2014-06-23: 500 [IU]
  Filled 2014-06-23: qty 5

## 2014-06-23 MED ORDER — ONDANSETRON 16 MG/50ML IVPB (CHCC)
16.0000 mg | Freq: Once | INTRAVENOUS | Status: AC
  Administered 2014-06-23: 16 mg via INTRAVENOUS

## 2014-06-23 MED ORDER — PACLITAXEL PROTEIN-BOUND CHEMO INJECTION 100 MG
125.0000 mg/m2 | Freq: Once | INTRAVENOUS | Status: AC
  Administered 2014-06-23: 275 mg via INTRAVENOUS
  Filled 2014-06-23: qty 55

## 2014-06-23 MED ORDER — SODIUM CHLORIDE 0.9 % IJ SOLN
10.0000 mL | INTRAMUSCULAR | Status: DC | PRN
  Administered 2014-06-23: 10 mL
  Filled 2014-06-23: qty 10

## 2014-06-23 MED ORDER — DIPHENHYDRAMINE HCL 50 MG/ML IJ SOLN
INTRAMUSCULAR | Status: AC
  Filled 2014-06-23: qty 1

## 2014-06-23 MED ORDER — ONDANSETRON 16 MG/50ML IVPB (CHCC)
INTRAVENOUS | Status: AC
  Filled 2014-06-23: qty 16

## 2014-06-23 MED ORDER — SODIUM CHLORIDE 0.9 % IV SOLN
Freq: Once | INTRAVENOUS | Status: AC
  Administered 2014-06-23: 15:00:00 via INTRAVENOUS

## 2014-06-23 MED ORDER — SODIUM CHLORIDE 0.9 % IV SOLN
180.0000 mg | Freq: Once | INTRAVENOUS | Status: AC
  Administered 2014-06-23: 180 mg via INTRAVENOUS
  Filled 2014-06-23: qty 18

## 2014-06-23 MED ORDER — DIPHENHYDRAMINE HCL 50 MG/ML IJ SOLN
25.0000 mg | Freq: Once | INTRAMUSCULAR | Status: AC
  Administered 2014-06-23: 25 mg via INTRAVENOUS

## 2014-06-23 NOTE — Progress Notes (Signed)
[place]  Telephone:(336) [phone] Fax:(336) [phone]     ID: [name] OB: 08-Jan-1956  MR#: [id number]  BWI#:[id number]  PCP: Penny Noon, Owens GYN:   SU: [name] OTHER Owens:  CHIEF COMPLAINT: Triple negative breast cancer  CURRENT TREATMENT: Adjuvant chemotherapy  BREAST CANCER HISTORY: From the original intake note 01/18/2014:  The patient had routine bilateral screening mammography at the breast Center for 10/28/2013 showing a possible mass in the right breast. Additional views and right breast ultrasonography 12/20/2013 confirmed an irregular 1.3 cm right breast mass at the 6:00 position. This was not palpable. Ultrasound showed an irregular hypoechoic mass in the right breast measuring 1.4 cm. There was no right axillary adenopathy identified.  On 12/29/2013 the patient underwent right breast biopsy, showing ([id number]) and invasive ductal carcinoma, grade 3, estrogen and progesterone receptor negative, HER-2 not amplified with a signals ratio of 1.35 and a copy number per cell of 2.10, and an MIB-1 of 80%.  On 01/12/2014 the patient underwent bilateral breast MRI. This showed the mass in the right breast lower outer quadrant to measure 2.2 cm maximally. There was a 2 cm well circumscribed mass in the medial right breast which has been stable since 2011 him a and a 1 cm oval mass in the upper midline of the right breast, likely an intramammary lymph node, with benign features. A right axillary lymph node with cortical thickening measured 0.9 cm. The fatty hilum was possibly effaced; note Right axilla had been benign by [place] before biopsy. In the left breast there were no findings of concern.  The patient's subsequent history is as detailed below.  INTERVAL HISTORY: [name] is here today for evaluation prior to receiving her weekly Abraxane and Carboplatin.  She continues to do well today.  She denies fevers, chills, nausea, vomiting,  constipation, diarrhea, shortness of breath, chest pain, palpitations.  Her neuropathy continues, and is stable and unchanged from prior.  She has no motor deficits.    REVIEW OF SYSTEMS: A 10 point review of systems was conducted and is otherwise negative except for what is noted above.      PAST MEDICAL HISTORY: Past Medical History  Diagnosis Date  . HTN (hypertension)   . Diabetes mellitus   . High cholesterol   . Breast cancer   . Family history of malignant neoplasm of breast   . Glaucoma   . Wears glasses   . Wears dentures     full top-partial bottom    PAST SURGICAL HISTORY: Past Surgical History  Procedure Laterality Date  . Transphenoidal pituitary resection  2006  . Abdominal hysterectomy  1993  . Breast biopsy  2000    LEFT  . Tonsillectomy    . Portacath placement Left 02/08/2014    Procedure: INSERTION PORT-A-CATH;  Surgeon: [name];  Location: [facility];  Service: General;  Laterality: Left;    FAMILY HISTORY Family History  Problem Relation Age of Onset  . Hypertension Mother   . Diabetes type II Mother   . Breast cancer Mother     dx ~18; deceased 53  . Hypertension Father   . Breast cancer Maternal Aunt     deceased 41  . Cancer Maternal Uncle     unk. type; deceased 31s  . Cancer Maternal Uncle     unk. type; deceased late 87s  . Uterine cancer Cousin 53    daughter of an unaffected mat aunt who is 27  the patient's father is in his early 84s. The patient's mother died at the age of 42, from complications of breast cancer which had been diagnosed "years before". The patient had 2 brothers, 2 sisters. The only other breast cancer in the family was a maternal aunt who died from breast cancer at the age of 89. (This means that 2 of 3 sisters, including the patient's mother, had breast cancer, one of them before the age of 66). There is no history of ovarian cancer in the family.  GYNECOLOGIC HISTORY:  Menarche age 60,  first live birth age 21. The patient is GX P1. She underwent total abdominal hysterectomy with bilateral salpingo-oophorectomy at age 12. She took hormone replacement for one or 2 months old.  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 The patient is a widow. She lives by herself, with no pets. Her daughter [name] lives in [place], where she works in a Geneticist, molecular. The patient has no grandchildren. She is a Psychologist, forensic   ADVANCED DIRECTIVES: Not in place; advanced directives were discussed with the patient at her initial visit 01/18/2014. She intends to name her daughter as healthcare power of attorney. [name] can be reached at [phone]   HEALTH MAINTENANCE: History  Substance Use Topics  . Smoking status: Former Smoker    Types: Cigarettes    Quit date: 01/31/1994  . Smokeless tobacco: Not on file  . Alcohol Use: Yes     Comment: occ     Colonoscopy:  PAP: Status post hysterectomy  Bone density: Remote  Lipid panel:  No Known Allergies  Current Outpatient Prescriptions  Medication Sig Dispense Refill  . ALPRAZolam (XANAX) 0.5 MG tablet Take 0.5 mg by mouth.      Marland Kitchen amLODipine (NORVASC) 10 MG tablet TAKE ONE TABLET BY MOUTH ONCE DAILY      . Atorvastatin Calcium (LIPITOR PO) Take 40 mg by mouth daily.       Marland Kitchen dexamethasone (DECADRON) 0.1 % ophthalmic solution Place 1 drop into both eyes daily.      Marland Kitchen glucose monitoring kit (FREESTYLE) monitoring kit One Touch Ultra machine and strips, patient tests her blood sugars bid      . glyBURIDE (DIABETA) 5 MG tablet Take 5 mg by mouth daily with breakfast.       . latanoprost (XALATAN) 0.005 % ophthalmic solution Place 1 drop into both eyes at bedtime.      . lidocaine-prilocaine (EMLA) cream Apply topically.       Marland Kitchen lisinopril-hydrochlorothiazide (PRINZIDE,ZESTORETIC) 20-12.5 MG per tablet Take 2 tablets by mouth daily.       . metFORMIN (GLUCOPHAGE-XR) 500 MG 24 hr tablet Take 500 mg by mouth 2 (two) times daily.       . ondansetron (ZOFRAN) 8 MG tablet Take 1 tablet (8 mg total) by mouth every 8 (eight) hours as needed for nausea or vomiting.  60 tablet  2  . ONE TOUCH ULTRA TEST test strip       . tiZANidine (ZANAFLEX) 4 MG tablet 4 mg at bedtime.       Marland Kitchen LORazepam (ATIVAN) 0.5 MG tablet Take 1 tablet (0.5 mg total) by mouth at bedtime as needed (Nausea or vomiting).  30 tablet  0  . oxyCODONE-acetaminophen (PERCOCET) 10-325 MG per  tablet Take 1 tablet by mouth every 6 (six) hours as needed for pain.  20 tablet  0   No current facility-administered medications for this visit.    OBJECTIVE: Middle-aged [place] American woman who appears stated age. She is obese not in acute distress  Filed Vitals:   06/23/14 1347  BP: 143/85  Pulse: 113  Temp: 98.6 F (37 C)  Resp: 20     Body mass index is 41.5 kg/(m^2).    GENERAL: Patient is a well appearing female in no acute distress HEENT:  Sclerae anicteric.  Oropharynx clear and moist. No ulcerations or evidence of oropharyngeal candidiasis. Neck is supple.  NODES:  No cervical, supraclavicular, or axillary lymphadenopathy palpated.  BREAST EXAM:  Deferred. LUNGS:  Clear to auscultation bilaterally.  No wheezes or rhonchi. HEART:  Regular rate and rhythm. No murmur appreciated. ABDOMEN:  Soft, nontender.  Positive, normoactive bowel sounds. No organomegaly palpated. MSK:  No focal spinal tenderness to palpation. Full range of motion bilaterally in the upper extremities. EXTREMITIES:  No peripheral edema.   SKIN:  Clear with no obvious rashes or skin changes. No nail dyscrasia. NEURO:  Nonfocal. Well oriented.  Appropriate affect. ECOG FS:1 - Symptomatic but completely ambulatory   LAB RESULTS:  CMP  I No results found for this basename:  SPEP,  UPEP,   kappa and lambda light chains    Lab Results  Component Value Date   WBC 2.8* 06/23/2014   NEUTROABS 1.9 06/23/2014   HGB 7.8* 06/23/2014   HCT 24.2* 06/23/2014   MCV 84.6 06/23/2014   PLT 276 06/23/2014      Chemistry      Component Value Date/Time   NA 139 06/23/2014 1336   NA 141 02/03/2014 1400   K 4.7 06/23/2014 1336   K 4.4 02/03/2014 1400   CL 100 02/03/2014 1400   CO2 19* 06/23/2014 1336   CO2 24 02/03/2014 1400   BUN 24.7 06/23/2014 1336   BUN 20 02/03/2014 1400   CREATININE 1.2* 06/23/2014 1336   CREATININE 1.16* 02/03/2014 1400   GLU 353* 04/07/2014 1113      Component Value Date/Time   CALCIUM 9.5 06/23/2014 1336   CALCIUM 10.0 02/03/2014 1400   ALKPHOS 133 06/23/2014 1336   ALKPHOS 114 02/03/2014 1400   AST 23 06/23/2014 1336   AST 15 02/03/2014 1400   ALT 32 06/23/2014 1336   ALT 15 02/03/2014 1400   BILITOT 0.38 06/23/2014 1336   BILITOT 0.3 02/03/2014 1400       Lab Results  Component Value Date   LABCA2 51* 02/03/2014    No components found with this basename: LABCA125    No results found for this basename: INR,  in the last 168 hours  Urinalysis No results found for this basename: colorurine,  appearanceur,  labspec,  phurine,  glucoseu,  hgbur,  bilirubinur,  ketonesur,  proteinur,  urobilinogen,  nitrite,  leukocytesur    STUDIES: No results found.  ASSESSMENT: 58 y.o. BRCA negative [place] woman status post right breast biopsy 12/29/2013 for a clinical T2 N1, stage IIB invasive ductal carcinoma, grade 3, triple negative, with an MIB-1 of 80%.  (1) status post right lumpectomy and sentinel lymph node sampling 02/08/2014 for a pT2 pN0, stage IIA invasive ductal carcinoma, grade 3, repeat prognostic panel again triple negative  (2) to start adjuvant chemotherapy 03/02/2014, initially doxorubicin and cyclophosphamide in dose dense fashion x4 with Neulasta day 2, to be followed by carboplatin and Abraxane weekly  x12,  (3)  adjuvant radiation to follow chemotherapy  (4) status post remote TAH BSO   (5) Status post removal of a pituitary adenoma 09/05/2005, no malignancy identified  (6) Uncontrolled hypertension  (7) Anemia - last requiring blood transfusion on 06/24/14  PLAN:  Glynis is doing well today.  Her lab work is stable for the most part.  She is anemic due to chemotherapy.  She will proceed with Abraxane and Carboplatin, however, she will return tomorrow and receive 2 units of PRBCs.  She is in agreement with this plan.  I reviewed the risk involved with blood transfusion along with the benefits.    Delania will return tomorrow for a blood transfusion and in one week for labs and chemotherapy.  She knows to call us in the interim for any questions or concerns.  We can certainly see her sooner if needed.  I spent 25 minutes counseling the patient face to face.  The total time spent in the appointment was 30 minutes.  Minette Headland, [place] [phone] 06/23/2014 2:57 PM

## 2014-06-23 NOTE — Patient Instructions (Signed)
[facility] Discharge Instructions for Patients Receiving Chemotherapy  Today you received the following chemotherapy agents abraxane/carboplatin  To help prevent nausea and vomiting after your treatment, we encourage you to take your nausea medication as directed   If you develop nausea and vomiting that is not controlled by your nausea medication, call the clinic.   BELOW ARE SYMPTOMS THAT SHOULD BE REPORTED IMMEDIATELY:  *FEVER GREATER THAN 100.5 F  *CHILLS WITH OR WITHOUT FEVER  NAUSEA AND VOMITING THAT IS NOT CONTROLLED WITH YOUR NAUSEA MEDICATION  *UNUSUAL SHORTNESS OF BREATH  *UNUSUAL BRUISING OR BLEEDING  TENDERNESS IN MOUTH AND THROAT WITH OR WITHOUT PRESENCE OF ULCERS  *URINARY PROBLEMS  *BOWEL PROBLEMS  UNUSUAL RASH Items with * indicate a potential emergency and should be followed up as soon as possible.  Feel free to call the clinic you have any questions or concerns. The clinic phone number is [phone].  

## 2014-06-23 NOTE — Progress Notes (Signed)
Ok to treat today Per Lisabeth Register, NP.  Pt to get blood transfusion tomorrow

## 2014-06-24 ENCOUNTER — Ambulatory Visit (HOSPITAL_BASED_OUTPATIENT_CLINIC_OR_DEPARTMENT_OTHER): Payer: BC Managed Care – PPO

## 2014-06-24 VITALS — BP 127/75 | HR 88 | Temp 97.3°F | Resp 18

## 2014-06-24 DIAGNOSIS — D649 Anemia, unspecified: Secondary | ICD-10-CM

## 2014-06-24 LAB — PREPARE RBC (CROSSMATCH)

## 2014-06-24 MED ORDER — ACETAMINOPHEN 325 MG PO TABS
650.0000 mg | ORAL_TABLET | Freq: Once | ORAL | Status: AC
  Administered 2014-06-24: 650 mg via ORAL

## 2014-06-24 MED ORDER — SODIUM CHLORIDE 0.9 % IJ SOLN
10.0000 mL | INTRAMUSCULAR | Status: AC | PRN
  Administered 2014-06-24: 10 mL
  Filled 2014-06-24: qty 10

## 2014-06-24 MED ORDER — HEPARIN SOD (PORK) LOCK FLUSH 100 UNIT/ML IV SOLN
250.0000 [IU] | INTRAVENOUS | Status: DC | PRN
  Filled 2014-06-24: qty 5

## 2014-06-24 MED ORDER — DIPHENHYDRAMINE HCL 25 MG PO CAPS
25.0000 mg | ORAL_CAPSULE | Freq: Once | ORAL | Status: AC
  Administered 2014-06-24: 25 mg via ORAL

## 2014-06-24 MED ORDER — ACETAMINOPHEN 325 MG PO TABS
ORAL_TABLET | ORAL | Status: AC
  Filled 2014-06-24: qty 2

## 2014-06-24 MED ORDER — SODIUM CHLORIDE 0.9 % IJ SOLN
3.0000 mL | INTRAMUSCULAR | Status: DC | PRN
  Filled 2014-06-24: qty 10

## 2014-06-24 MED ORDER — DIPHENHYDRAMINE HCL 25 MG PO CAPS
ORAL_CAPSULE | ORAL | Status: AC
  Filled 2014-06-24: qty 1

## 2014-06-25 LAB — TYPE AND SCREEN
ABO/RH(D): A POS
Antibody Screen: NEGATIVE
UNIT DIVISION: 0
Unit division: 0

## 2014-06-29 ENCOUNTER — Other Ambulatory Visit (HOSPITAL_BASED_OUTPATIENT_CLINIC_OR_DEPARTMENT_OTHER): Payer: BC Managed Care – PPO

## 2014-06-29 ENCOUNTER — Ambulatory Visit (HOSPITAL_BASED_OUTPATIENT_CLINIC_OR_DEPARTMENT_OTHER): Payer: BC Managed Care – PPO | Admitting: Nurse Practitioner

## 2014-06-29 VITALS — BP 162/92 | HR 106 | Temp 98.4°F | Resp 18 | Ht 65.0 in | Wt 251.6 lb

## 2014-06-29 DIAGNOSIS — C50811 Malignant neoplasm of overlapping sites of right female breast: Secondary | ICD-10-CM

## 2014-06-29 DIAGNOSIS — D649 Anemia, unspecified: Secondary | ICD-10-CM

## 2014-06-29 DIAGNOSIS — D6481 Anemia due to antineoplastic chemotherapy: Secondary | ICD-10-CM

## 2014-06-29 DIAGNOSIS — C50511 Malignant neoplasm of lower-outer quadrant of right female breast: Secondary | ICD-10-CM

## 2014-06-29 DIAGNOSIS — Z171 Estrogen receptor negative status [ER-]: Secondary | ICD-10-CM

## 2014-06-29 DIAGNOSIS — I10 Essential (primary) hypertension: Secondary | ICD-10-CM

## 2014-06-29 DIAGNOSIS — T451X5A Adverse effect of antineoplastic and immunosuppressive drugs, initial encounter: Secondary | ICD-10-CM

## 2014-06-29 LAB — CBC WITH DIFFERENTIAL/PLATELET
BASO%: 0.4 % (ref 0.0–2.0)
Basophils Absolute: 0 10*3/uL (ref 0.0–0.1)
EOS%: 0.3 % (ref 0.0–7.0)
Eosinophils Absolute: 0 10*3/uL (ref 0.0–0.5)
HCT: 31.5 % — ABNORMAL LOW (ref 34.8–46.6)
HGB: 10.3 g/dL — ABNORMAL LOW (ref 11.6–15.9)
LYMPH#: 0.4 10*3/uL — AB (ref 0.9–3.3)
LYMPH%: 14.6 % (ref 14.0–49.7)
MCH: 28.3 pg (ref 25.1–34.0)
MCHC: 32.5 g/dL (ref 31.5–36.0)
MCV: 86.8 fL (ref 79.5–101.0)
MONO#: 0.3 10*3/uL (ref 0.1–0.9)
MONO%: 13.1 % (ref 0.0–14.0)
NEUT%: 71.6 % (ref 38.4–76.8)
NEUTROS ABS: 1.9 10*3/uL (ref 1.5–6.5)
Platelets: 237 10*3/uL (ref 145–400)
RBC: 3.63 10*6/uL — AB (ref 3.70–5.45)
RDW: 17.6 % — ABNORMAL HIGH (ref 11.2–14.5)
WBC: 2.6 10*3/uL — ABNORMAL LOW (ref 3.9–10.3)

## 2014-06-29 NOTE — Progress Notes (Signed)
[facility]  [phone] [phone]     ID: [name] OB: 1956/05/15  MR#: [id number]  SLH#:[id number]  PCP: Penny Noon, MD GYN:   SU: [name] OTHER MD:  CHIEF COMPLAINT: Triple negative breast cancer  CURRENT TREATMENT: Adjuvant chemotherapy  BREAST CANCER HISTORY: From the original intake note 01/18/2014:  The patient had routine bilateral screening mammography at the breast Center for 10/28/2013 showing a possible mass in the right breast. Additional views and right breast ultrasonography 12/20/2013 confirmed an irregular 1.3 cm right breast mass at the 6:00 position. This was not palpable. Ultrasound showed an irregular hypoechoic mass in the right breast measuring 1.4 cm. There was no right axillary adenopathy identified.  On 12/29/2013 the patient underwent right breast biopsy, showing ([id number]) and invasive ductal carcinoma, grade 3, estrogen and progesterone receptor negative, HER-2 not amplified with a signals ratio of 1.35 and a copy number per cell of 2.10, and an MIB-1 of 80%.  On 01/12/2014 the patient underwent bilateral breast MRI. This showed the mass in the right breast lower outer quadrant to measure 2.2 cm maximally. There was a 2 cm well circumscribed mass in the medial right breast which has been stable since 2011 him a and a 1 cm oval mass in the upper midline of the right breast, likely an intramammary lymph node, with benign features. A right axillary lymph node with cortical thickening measured 0.9 cm. The fatty hilum was possibly effaced; note Right axilla had been benign by [place] before biopsy. In the left breast there were no findings of concern.  The patient's subsequent history is as detailed below.  INTERVAL HISTORY: [name] returns today for follow up of her breast cancer. She is due for cycle 7 of abraxane and carboplatin tomorrow. She received 2 units of blood last week, otherwise the interval  history is unremarkable. [name] continues to do well with no complaints. She has been mindful of her diet, and her blood glucoses are much improved. She denies fevers, chills, nausea, vomiting, constipation, diarrhea, shortness of breath, chest pain, or palpitations. 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    REVIEW OF SYSTEMS: A detailed review of systems is otherwise noncontributory, except where noted above.     PAST MEDICAL HISTORY: Past Medical History  Diagnosis Date  . HTN (hypertension)   . Diabetes mellitus   . High cholesterol   . Breast cancer   . Family history of malignant neoplasm of breast   . Glaucoma   . Wears glasses   . Wears dentures     full top-partial bottom    PAST SURGICAL HISTORY: Past Surgical History  Procedure Laterality Date  . Transphenoidal pituitary resection  2006  . Abdominal hysterectomy  1993  . Breast biopsy  2000    LEFT  . Tonsillectomy    . Portacath placement Left 02/08/2014    Procedure: INSERTION PORT-A-CATH;  Surgeon: Penny Bookbinder, MD;  Location: [facility];  Service: General;  Laterality: Left;    FAMILY HISTORY Family History  Problem Relation Age of Onset  . Hypertension Mother   . Diabetes type II Mother   . Breast cancer Mother     dx ~23; deceased 82  . Hypertension Father   . Breast cancer Maternal Aunt     deceased 11  . Cancer Maternal Uncle     unk. type; deceased 23s  . Cancer Maternal Uncle     unk.  type; deceased late 27s  . Uterine cancer Cousin 87    daughter of an unaffected mat aunt who is 25   the patient's father is in his early 37s. The patient's mother died at the age of 10, from complications of breast cancer which had been diagnosed "years before". The patient had 2 brothers, 2 sisters. The only other breast cancer in the family was a maternal aunt who died from breast cancer at the age of 38. (This means that 2 of 3 sisters, including the  patient's mother, had breast cancer, one of them before the age of 34). There is no history of ovarian cancer in the family.  GYNECOLOGIC HISTORY:  Menarche age 21, first live birth age 31. The patient is GX P1. She underwent total abdominal hysterectomy with bilateral salpingo-oophorectomy at age 37. She took hormone replacement for one or 2 months old.  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 The patient is a widow. She lives by herself, with no pets. Her daughter [name] lives in [place], where she works in a Geneticist, molecular. The patient has no grandchildren. She is a Psychologist, forensic   ADVANCED DIRECTIVES: Not in place; advanced directives were discussed with the patient at her initial visit 01/18/2014. She intends to name her daughter as healthcare power of attorney. [name] can be reached at [phone]   HEALTH MAINTENANCE: History  Substance Use Topics  . Smoking status: Former Smoker    Types: Cigarettes    Quit date: 01/31/1994  . Smokeless tobacco: Not on file  . Alcohol Use: Yes     Comment: occ     Colonoscopy:  PAP: Status post hysterectomy  Bone density: Remote  Lipid panel:  No Known Allergies  Current Outpatient Prescriptions  Medication Sig Dispense Refill  . ALPRAZolam (XANAX) 0.5 MG tablet Take 0.5 mg by mouth.      Marland Kitchen amLODipine (NORVASC) 10 MG tablet TAKE ONE TABLET BY MOUTH ONCE DAILY      . Atorvastatin Calcium (LIPITOR PO) Take 40 mg by mouth daily.       Marland Kitchen dexamethasone (DECADRON) 0.1 % ophthalmic solution Place 1 drop into both eyes daily.      Marland Kitchen glucose monitoring kit (FREESTYLE) monitoring kit One Touch Ultra machine and strips, patient tests her blood sugars bid      . glyBURIDE (DIABETA) 5 MG tablet Take 5 mg by mouth daily with  breakfast.       . latanoprost (XALATAN) 0.005 % ophthalmic solution Place 1 drop into both eyes at bedtime.      . lidocaine-prilocaine (EMLA) cream Apply topically.      Marland Kitchen lisinopril-hydrochlorothiazide (PRINZIDE,ZESTORETIC) 20-12.5 MG per tablet Take 2 tablets by mouth daily.       Marland Kitchen LORazepam (ATIVAN) 0.5 MG tablet Take 1 tablet (0.5 mg total) by mouth at bedtime as needed (Nausea or vomiting).  30 tablet  0  . metFORMIN (GLUCOPHAGE-XR) 500 MG 24 hr tablet Take 500 mg by mouth 2 (two) times daily.       . ondansetron (ZOFRAN) 8 MG tablet Take 1 tablet (8 mg total) by mouth every 8 (eight) hours as needed for nausea or vomiting.  60 tablet  2  . ONE TOUCH ULTRA TEST test strip       .  oxyCODONE-acetaminophen (PERCOCET) 10-325 MG per tablet Take 1 tablet by mouth every 6 (six) hours as needed for pain.  20 tablet  0  . tiZANidine (ZANAFLEX) 4 MG tablet 4 mg at bedtime.        No current facility-administered medications for this visit.    OBJECTIVE: Middle-aged [place] American woman who appears stated age. She is obese not in acute distress  Filed Vitals:   06/29/14 1526  BP: 162/92  Pulse: 106  Temp: 98.4 F (36.9 C)  Resp: 18     Body mass index is 41.87 kg/(m^2).    Skin: warm, dry, hyperpigmented palms  HEENT: sclerae anicteric, conjunctivae pink, oropharynx clear. No thrush or mucositis.  Lymph Nodes: No cervical or supraclavicular lymphadenopathy  Lungs: clear to auscultation bilaterally, no rales, wheezes, or rhonci  Heart: regular rate and rhythm  Abdomen: round, soft, non tender, positive bowel sounds  Musculoskeletal: No focal spinal tenderness, no peripheral edema  Neuro: non focal, well oriented, positive affect  Breasts: deferred  ECOG FS:1 - Symptomatic but completely ambulatory   LAB RESULTS:  CMP  I No results found for this basename: SPEP,  UPEP,   kappa and lambda light chains    Lab Results  Component Value Date   WBC 2.6* 06/29/2014   NEUTROABS  1.9 06/29/2014   HGB 10.3* 06/29/2014   HCT 31.5* 06/29/2014   MCV 86.8 06/29/2014   PLT 237 06/29/2014      Chemistry      Component Value Date/Time   NA 139 06/23/2014 1336   NA 141 02/03/2014 1400   K 4.7 06/23/2014 1336   K 4.4 02/03/2014 1400   CL 100 02/03/2014 1400   CO2 19* 06/23/2014 1336   CO2 24 02/03/2014 1400   BUN 24.7 06/23/2014 1336   BUN 20 02/03/2014 1400   CREATININE 1.2* 06/23/2014 1336   CREATININE 1.16* 02/03/2014 1400   GLU 353* 04/07/2014 1113      Component Value Date/Time   CALCIUM 9.5 06/23/2014 1336   CALCIUM 10.0 02/03/2014 1400   ALKPHOS 133 06/23/2014 1336   ALKPHOS 114 02/03/2014 1400   AST 23 06/23/2014 1336   AST 15 02/03/2014 1400   ALT 32 06/23/2014 1336   ALT 15 02/03/2014 1400   BILITOT 0.38 06/23/2014 1336   BILITOT 0.3 02/03/2014 1400       Lab Results  Component Value Date   LABCA2 51* 02/03/2014    No components found with this basename: LABCA125    No results found for this basename: INR,  in the last 168 hours  Urinalysis No results found for this basename: colorurine,  appearanceur,  labspec,  phurine,  glucoseu,  hgbur,  bilirubinur,  ketonesur,  proteinur,  urobilinogen,  nitrite,  leukocytesur    STUDIES: No results found.  ASSESSMENT: 58 y.o. BRCA negative [place] woman status post right breast biopsy 12/29/2013 for a clinical T2 N1, stage IIB invasive ductal carcinoma, grade 3, triple negative, with an MIB-1 of 80%.  (1) status post right lumpectomy and sentinel lymph node sampling 02/08/2014 for a pT2 pN0, stage IIA invasive ductal carcinoma, grade 3, repeat prognostic panel again triple negative  (2) to start adjuvant chemotherapy 03/02/2014, initially doxorubicin and cyclophosphamide in dose dense fashion x4 with Neulasta day 2, to be followed by carboplatin and Abraxane weekly x12,  (3) adjuvant radiation to follow chemotherapy  (4) status post remote TAH BSO   (5) Status post removal of a pituitary adenoma  09/05/2005, no malignancy  identified  (6) Uncontrolled hypertension  (7) Anemia - last requiring blood transfusion on 06/24/14  PLAN: Elliott looks and feels well today. The labs were reviewed in detail and were stable. Since the blood transfusion, her hgb is up to 10.3. We will continue to monitor this value.   Simi will proceed with cycle 7 of abraxane and carboplatin tomorrow. She will return next week for labs, evaluation, and the start of cycle 8. She understands and agrees with this plan. She knows the goal of treatment in her case is cure. She has been encouraged to call with any issues that might arise before her next visit here.   Marcelino Duster, [place] [phone] 06/29/2014 3:37 PM

## 2014-06-30 ENCOUNTER — Encounter: Payer: Self-pay | Admitting: Nurse Practitioner

## 2014-06-30 ENCOUNTER — Ambulatory Visit: Payer: BC Managed Care – PPO

## 2014-06-30 ENCOUNTER — Ambulatory Visit (HOSPITAL_BASED_OUTPATIENT_CLINIC_OR_DEPARTMENT_OTHER): Payer: BC Managed Care – PPO

## 2014-06-30 VITALS — BP 145/83 | HR 89 | Temp 98.2°F | Resp 18

## 2014-06-30 DIAGNOSIS — C50811 Malignant neoplasm of overlapping sites of right female breast: Secondary | ICD-10-CM

## 2014-06-30 DIAGNOSIS — C50511 Malignant neoplasm of lower-outer quadrant of right female breast: Secondary | ICD-10-CM

## 2014-06-30 DIAGNOSIS — Z5111 Encounter for antineoplastic chemotherapy: Secondary | ICD-10-CM

## 2014-06-30 LAB — COMPREHENSIVE METABOLIC PANEL (CC13)
ALBUMIN: 3.6 g/dL (ref 3.5–5.0)
ALK PHOS: 128 U/L (ref 40–150)
ALT: 42 U/L (ref 0–55)
AST: 27 U/L (ref 5–34)
Anion Gap: 9 mEq/L (ref 3–11)
BUN: 20.9 mg/dL (ref 7.0–26.0)
CHLORIDE: 110 meq/L — AB (ref 98–109)
CO2: 21 mEq/L — ABNORMAL LOW (ref 22–29)
Calcium: 9.9 mg/dL (ref 8.4–10.4)
Creatinine: 1.1 mg/dL (ref 0.6–1.1)
Glucose: 115 mg/dl (ref 70–140)
Potassium: 4.3 mEq/L (ref 3.5–5.1)
SODIUM: 140 meq/L (ref 136–145)
Total Bilirubin: 0.44 mg/dL (ref 0.20–1.20)
Total Protein: 7.1 g/dL (ref 6.4–8.3)

## 2014-06-30 MED ORDER — DIPHENHYDRAMINE HCL 50 MG/ML IJ SOLN
25.0000 mg | Freq: Once | INTRAMUSCULAR | Status: AC
  Administered 2014-06-30: 25 mg via INTRAVENOUS

## 2014-06-30 MED ORDER — SODIUM CHLORIDE 0.9 % IV SOLN
200.0000 mg | Freq: Once | INTRAVENOUS | Status: AC
  Administered 2014-06-30: 200 mg via INTRAVENOUS
  Filled 2014-06-30: qty 20

## 2014-06-30 MED ORDER — DIPHENHYDRAMINE HCL 50 MG/ML IJ SOLN
INTRAMUSCULAR | Status: AC
  Filled 2014-06-30: qty 1

## 2014-06-30 MED ORDER — ONDANSETRON 16 MG/50ML IVPB (CHCC)
16.0000 mg | Freq: Once | INTRAVENOUS | Status: AC
  Administered 2014-06-30: 16 mg via INTRAVENOUS

## 2014-06-30 MED ORDER — HEPARIN SOD (PORK) LOCK FLUSH 100 UNIT/ML IV SOLN
500.0000 [IU] | Freq: Once | INTRAVENOUS | Status: AC | PRN
Start: 2014-06-30 — End: 2014-06-30
  Administered 2014-06-30: 500 [IU]
  Filled 2014-06-30: qty 5

## 2014-06-30 MED ORDER — PACLITAXEL PROTEIN-BOUND CHEMO INJECTION 100 MG
125.0000 mg/m2 | Freq: Once | INTRAVENOUS | Status: AC
  Administered 2014-06-30: 275 mg via INTRAVENOUS
  Filled 2014-06-30: qty 55

## 2014-06-30 MED ORDER — ONDANSETRON 16 MG/50ML IVPB (CHCC)
INTRAVENOUS | Status: AC
Start: 2014-06-30 — End: 2014-06-30
  Filled 2014-06-30: qty 16

## 2014-06-30 MED ORDER — SODIUM CHLORIDE 0.9 % IV SOLN
Freq: Once | INTRAVENOUS | Status: AC
  Administered 2014-06-30: 14:00:00 via INTRAVENOUS

## 2014-06-30 MED ORDER — SODIUM CHLORIDE 0.9 % IJ SOLN
10.0000 mL | INTRAMUSCULAR | Status: DC | PRN
  Administered 2014-06-30: 10 mL
  Filled 2014-06-30: qty 10

## 2014-06-30 NOTE — Patient Instructions (Signed)
[facility] Discharge Instructions for Patients Receiving Chemotherapy  Today you received the following chemotherapy agents Abraxane and Carboplatin.   To help prevent nausea and vomiting after your treatment, we encourage you to take your nausea medication.   If you develop nausea and vomiting that is not controlled by your nausea medication, call the clinic.   BELOW ARE SYMPTOMS THAT SHOULD BE REPORTED IMMEDIATELY:  *FEVER GREATER THAN 100.5 F  *CHILLS WITH OR WITHOUT FEVER  NAUSEA AND VOMITING THAT IS NOT CONTROLLED WITH YOUR NAUSEA MEDICATION  *UNUSUAL SHORTNESS OF BREATH  *UNUSUAL BRUISING OR BLEEDING  TENDERNESS IN MOUTH AND THROAT WITH OR WITHOUT PRESENCE OF ULCERS  *URINARY PROBLEMS  *BOWEL PROBLEMS  UNUSUAL RASH Items with * indicate a potential emergency and should be followed up as soon as possible.  Feel free to call the clinic you have any questions or concerns. The clinic phone number is [phone].    

## 2014-07-04 ENCOUNTER — Telehealth: Payer: Self-pay | Admitting: *Deleted

## 2014-07-04 ENCOUNTER — Other Ambulatory Visit: Payer: Self-pay | Admitting: Adult Health

## 2014-07-04 DIAGNOSIS — G629 Polyneuropathy, unspecified: Secondary | ICD-10-CM

## 2014-07-04 MED ORDER — GABAPENTIN 100 MG PO CAPS: 100.0000 mg | ORAL_CAPSULE | Freq: Three times a day (TID) | ORAL | Status: DC

## 2014-07-04 NOTE — Telephone Encounter (Signed)
Returned pt's phone call concerning increased tingling to both feet. Pt said this past weekend she was involved in a lot of activities for homecoming and over did it. She has been resting mostly and staying off her feet.Informed NP about pt's complaint,she Rx Gabapentin 100 mg, Disp-90, Refill-0 to pt's pharmacy. Communicated with pt the instructions how to take medication. Pt verbalized understanding. No further concerns. Message to be forwarded to Charlestine Massed, NP.

## 2014-07-07 ENCOUNTER — Ambulatory Visit: Payer: BC Managed Care – PPO

## 2014-07-07 ENCOUNTER — Ambulatory Visit (HOSPITAL_BASED_OUTPATIENT_CLINIC_OR_DEPARTMENT_OTHER): Payer: BC Managed Care – PPO | Admitting: Adult Health

## 2014-07-07 ENCOUNTER — Other Ambulatory Visit (HOSPITAL_BASED_OUTPATIENT_CLINIC_OR_DEPARTMENT_OTHER): Payer: BC Managed Care – PPO

## 2014-07-07 ENCOUNTER — Encounter: Payer: Self-pay | Admitting: Adult Health

## 2014-07-07 VITALS — BP 127/71 | HR 105 | Temp 98.1°F | Resp 20 | Ht 65.0 in | Wt 248.8 lb

## 2014-07-07 DIAGNOSIS — Z171 Estrogen receptor negative status [ER-]: Secondary | ICD-10-CM

## 2014-07-07 DIAGNOSIS — C50511 Malignant neoplasm of lower-outer quadrant of right female breast: Secondary | ICD-10-CM

## 2014-07-07 DIAGNOSIS — E119 Type 2 diabetes mellitus without complications: Secondary | ICD-10-CM

## 2014-07-07 DIAGNOSIS — I10 Essential (primary) hypertension: Secondary | ICD-10-CM

## 2014-07-07 LAB — COMPREHENSIVE METABOLIC PANEL (CC13)
ALBUMIN: 3.8 g/dL (ref 3.5–5.0)
ALT: 32 U/L (ref 0–55)
ANION GAP: 9 meq/L (ref 3–11)
AST: 24 U/L (ref 5–34)
Alkaline Phosphatase: 116 U/L (ref 40–150)
BUN: 25.2 mg/dL (ref 7.0–26.0)
CALCIUM: 9 mg/dL (ref 8.4–10.4)
CHLORIDE: 110 meq/L — AB (ref 98–109)
CO2: 18 meq/L — AB (ref 22–29)
Creatinine: 1.2 mg/dL — ABNORMAL HIGH (ref 0.6–1.1)
GLUCOSE: 140 mg/dL (ref 70–140)
POTASSIUM: 4.3 meq/L (ref 3.5–5.1)
Sodium: 137 mEq/L (ref 136–145)
Total Bilirubin: 0.52 mg/dL (ref 0.20–1.20)
Total Protein: 7 g/dL (ref 6.4–8.3)

## 2014-07-07 LAB — CBC WITH DIFFERENTIAL/PLATELET
BASO%: 0.5 % (ref 0.0–2.0)
BASOS ABS: 0 10*3/uL (ref 0.0–0.1)
EOS ABS: 0 10*3/uL (ref 0.0–0.5)
EOS%: 0.3 % (ref 0.0–7.0)
HCT: 29.4 % — ABNORMAL LOW (ref 34.8–46.6)
HEMOGLOBIN: 9.5 g/dL — AB (ref 11.6–15.9)
LYMPH#: 0.4 10*3/uL — AB (ref 0.9–3.3)
LYMPH%: 12.8 % — ABNORMAL LOW (ref 14.0–49.7)
MCH: 27.8 pg (ref 25.1–34.0)
MCHC: 32.2 g/dL (ref 31.5–36.0)
MCV: 86.3 fL (ref 79.5–101.0)
MONO#: 0.3 10*3/uL (ref 0.1–0.9)
MONO%: 7.5 % (ref 0.0–14.0)
NEUT%: 78.9 % — AB (ref 38.4–76.8)
NEUTROS ABS: 2.7 10*3/uL (ref 1.5–6.5)
Platelets: 152 10*3/uL (ref 145–400)
RBC: 3.4 10*6/uL — ABNORMAL LOW (ref 3.70–5.45)
RDW: 17.8 % — AB (ref 11.2–14.5)
WBC: 3.4 10*3/uL — ABNORMAL LOW (ref 3.9–10.3)

## 2014-07-07 NOTE — Progress Notes (Signed)
[facility]  [phone] [phone]     ID: [name] OB: 10/09/1955  MR#: [id number]  CSN#:[id number]  PCP: BADGER,MICHAEL C, MD GYN:   SU: Matthew Wakefield OTHER MD:  CHIEF COMPLAINT: Triple negative breast cancer  CURRENT TREATMENT: Adjuvant chemotherapy  BREAST CANCER HISTORY: From the original intake note 01/18/2014:  The patient had routine bilateral screening mammography at the breast Center for 10/28/2013 showing a possible mass in the right breast. Additional views and right breast ultrasonography 12/20/2013 confirmed an irregular 1.3 cm right breast mass at the 6:00 position. This was not palpable. Ultrasound showed an irregular hypoechoic mass in the right breast measuring 1.4 cm. There was no right axillary adenopathy identified.  On 12/29/2013 the patient underwent right breast biopsy, showing ([id number]) and invasive ductal carcinoma, grade 3, estrogen and progesterone receptor negative, HER-2 not amplified with a signals ratio of 1.35 and a copy number per cell of 2.10, and an MIB-1 of 80%.  On 01/12/2014 the patient underwent bilateral breast MRI. This showed the mass in the right breast lower outer quadrant to measure 2.2 cm maximally. There was a 2 cm well circumscribed mass in the medial right breast which has been stable since 2011 him a and a 1 cm oval mass in the upper midline of the right breast, likely an intramammary lymph node, with benign features. A right axillary lymph node with cortical thickening measured 0.9 cm. The fatty hilum was possibly effaced; note Right axilla had been benign by [place] before biopsy. In the left breast there were no findings of concern.  The patient's subsequent history is as detailed below.  INTERVAL HISTORY: [name] returns today for follow up of her breast cancer. She is due for cycle 8 of abraxane and carboplatin today.  She unfortunately has peripheral neuropathy that has gone from  intermittent to constant this past week.  It was bothering her to the extent of calling our office earlier in the week and I prescribed Gabapentin 100mg TID.  It did immediately help with her fingertips but not with her toes.  She is able to use her fingers and walk, however the constant numbness has remained.  She denies fevers, chills, nasuea, vomiting, constipation, diarrhea, or any further concerns.    REVIEW OF SYSTEMS: A detailed review of systems is otherwise noncontributory, except where noted above.     PAST MEDICAL HISTORY: Past Medical History  Diagnosis Date  . HTN (hypertension)   . Diabetes mellitus   . High cholesterol   . Breast cancer   . Family history of malignant neoplasm of breast   . Glaucoma   . Wears glasses   . Wears dentures     full top-partial bottom    PAST SURGICAL HISTORY: Past Surgical History  Procedure Laterality Date  . Transphenoidal pituitary resection  2006  . Abdominal hysterectomy  1993  . Breast biopsy  2000    LEFT  . Tonsillectomy    . Portacath placement Left 02/08/2014    Procedure: INSERTION PORT-A-CATH;  Surgeon: Matthew Wakefield, MD;  Location: [facility];  Service: General;  Laterality: Left;    FAMILY HISTORY Family History  Problem Relation Age of Onset  . Hypertension Mother   . Diabetes type II Mother   . Breast cancer Mother     dx ~72; deceased 77  . Hypertension Father   . Breast cancer Maternal Aunt     deceased 42  . Cancer Maternal Uncle       unk. type; deceased 83s  . Cancer Maternal Uncle     unk. type; deceased late 59s  . Uterine cancer Cousin 50    daughter of an unaffected mat aunt who is 35   the patient's father is in his early 39s. The patient's mother died at the age of 55, from complications of breast cancer which had been diagnosed "years before". The patient had 2 brothers, 2 sisters. The only other breast cancer in the family was a maternal aunt who died from breast cancer at the age  of 39. (This means that 2 of 3 sisters, including the patient's mother, had breast cancer, one of them before the age of 92). There is no history of ovarian cancer in the family.  GYNECOLOGIC HISTORY:  Menarche age 67, first live birth age 89. The patient is GX P1. She underwent total abdominal hysterectomy with bilateral salpingo-oophorectomy at age 28. She took hormone replacement for one or 2 months old.  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 The patient is a widow. She lives by herself, with no pets. Her daughter Lanita Stammen lives in [place], where she works in a Geneticist, molecular. The patient has no grandchildren. She is a Psychologist, forensic   ADVANCED DIRECTIVES: Not in place; advanced directives were discussed with the patient at her initial visit 01/18/2014. She intends to name her daughter as healthcare power of attorney. [name] can be reached at [phone]   HEALTH MAINTENANCE: History  Substance Use Topics  . Smoking status: Former Smoker    Types: Cigarettes    Quit date: 01/31/1994  . Smokeless tobacco: Not on file  . Alcohol Use: Yes     Comment: occ     Colonoscopy:  PAP: Status post hysterectomy  Bone density: Remote  Lipid panel:  No Known Allergies  Current Outpatient Prescriptions  Medication Sig Dispense Refill  . ALPRAZolam (XANAX) 0.5 MG tablet Take 0.5 mg by mouth.      Marland Kitchen amLODipine (NORVASC) 10 MG tablet TAKE ONE TABLET BY MOUTH ONCE DAILY      . Atorvastatin Calcium (LIPITOR PO) Take 40 mg by mouth daily.       Marland Kitchen dexamethasone (DECADRON) 0.1 % ophthalmic solution Place 1 drop into both eyes daily.      Marland Kitchen gabapentin (NEURONTIN) 100 MG capsule Take 1 capsule (100 mg total) by mouth 3 (three) times daily.  90 capsule  0  . glucose monitoring  kit (FREESTYLE) monitoring kit One Touch Ultra machine and strips, patient tests her blood sugars bid      . glyBURIDE (DIABETA) 5 MG tablet Take 5 mg by mouth daily with breakfast.       . latanoprost (XALATAN) 0.005 % ophthalmic solution Place 1 drop into both eyes at bedtime.      . lidocaine-prilocaine (EMLA) cream Apply topically.      Marland Kitchen lisinopril-hydrochlorothiazide (PRINZIDE,ZESTORETIC) 20-12.5 MG per tablet Take 2 tablets by mouth daily.       Marland Kitchen LORazepam (ATIVAN) 0.5 MG tablet Take 1 tablet (0.5 mg total) by mouth at bedtime as needed (Nausea or vomiting).  30 tablet  0  . metFORMIN (GLUCOPHAGE-XR) 500 MG 24 hr tablet Take 500 mg by mouth 2 (two) times daily.       . ondansetron (ZOFRAN)  8 MG tablet Take 1 tablet (8 mg total) by mouth every 8 (eight) hours as needed for nausea or vomiting.  60 tablet  2  . ONE TOUCH ULTRA TEST test strip       . tiZANidine (ZANAFLEX) 4 MG tablet 4 mg at bedtime.       . oxyCODONE-acetaminophen (PERCOCET) 10-325 MG per tablet Take 1 tablet by mouth every 6 (six) hours as needed for pain.  20 tablet  0   No current facility-administered medications for this visit.    OBJECTIVE:  Filed Vitals:   07/07/14 1312  BP: 127/71  Pulse: 105  Temp: 98.1 F (36.7 C)  Resp: 20     Body mass index is 41.4 kg/(m^2).   GENERAL: Patient is a well appearing female in no acute distress HEENT:  Sclerae anicteric.  Oropharynx clear and moist. No ulcerations or evidence of oropharyngeal candidiasis. Neck is supple.  NODES:  No cervical, supraclavicular, or axillary lymphadenopathy palpated.  BREAST EXAM:  Deferred. LUNGS:  Clear to auscultation bilaterally.  No wheezes or rhonchi. HEART:  Regular rate and rhythm. No murmur appreciated. ABDOMEN:  Soft, nontender.  Positive, normoactive bowel sounds. No organomegaly palpated. MSK:  No focal spinal tenderness to palpation. Full range of motion bilaterally in the upper extremities. EXTREMITIES:  No peripheral edema.     SKIN:  Clear with no obvious rashes or skin changes. No nail dyscrasia. NEURO:  Nonfocal. Well oriented.  Appropriate affect. ECOG FS:1 - Symptomatic but completely ambulatory   LAB RESULTS:  CMP  I No results found for this basename: SPEP,  UPEP,   kappa and lambda light chains    Lab Results  Component Value Date   WBC 3.4* 07/07/2014   NEUTROABS 2.7 07/07/2014   HGB 9.5* 07/07/2014   HCT 29.4* 07/07/2014   MCV 86.3 07/07/2014   PLT 152 07/07/2014      Chemistry      Component Value Date/Time   NA 140 06/29/2014 1514   NA 141 02/03/2014 1400   K 4.3 06/29/2014 1514   K 4.4 02/03/2014 1400   CL 100 02/03/2014 1400   CO2 21* 06/29/2014 1514   CO2 24 02/03/2014 1400   BUN 20.9 06/29/2014 1514   BUN 20 02/03/2014 1400   CREATININE 1.1 06/29/2014 1514   CREATININE 1.16* 02/03/2014 1400   GLU 353* 04/07/2014 1113      Component Value Date/Time   CALCIUM 9.9 06/29/2014 1514   CALCIUM 10.0 02/03/2014 1400   ALKPHOS 128 06/29/2014 1514   ALKPHOS 114 02/03/2014 1400   AST 27 06/29/2014 1514   AST 15 02/03/2014 1400   ALT 42 06/29/2014 1514   ALT 15 02/03/2014 1400   BILITOT 0.44 06/29/2014 1514   BILITOT 0.3 02/03/2014 1400       Lab Results  Component Value Date   LABCA2 51* 02/03/2014    No components found with this basename: LABCA125    No results found for this basename: INR,  in the last 168 hours  Urinalysis No results found for this basename: colorurine,  appearanceur,  labspec,  phurine,  glucoseu,  hgbur,  bilirubinur,  ketonesur,  proteinur,  urobilinogen,  nitrite,  leukocytesur    STUDIES: No results found.  ASSESSMENT: 58 y.o. BRCA negative [place] woman status post right breast biopsy 12/29/2013 for a clinical T2 N1, stage IIB invasive ductal carcinoma, grade 3, triple negative, with an MIB-1 of 80%.  (1) status post right lumpectomy and sentinel lymph node   sampling 02/08/2014 for a pT2 pN0, stage IIA invasive ductal carcinoma, grade 3, repeat  prognostic panel again triple negative  (2) to start adjuvant chemotherapy 03/02/2014, initially doxorubicin and cyclophosphamide in dose dense fashion x4 with Neulasta day 2, to be followed by carboplatin and Abraxane weekly x12,  (3) adjuvant radiation to follow chemotherapy  (4) status post remote TAH BSO   (5) Status post removal of a pituitary adenoma 09/05/2005, no malignancy identified  (6) Uncontrolled hypertension  (7) Anemia - last requiring blood transfusion on 06/24/14  PLAN:  [name] is doing well today.  She does have peripheral neuropathy that is now constant.  She is tolerating the Gabapetnin 1104m TID I prescribed earlier this week for her rather well.  Due to this she will not receive chemotherapy today.  I will review with Dr. MJana Hakim and we will see her next week to discuss her therapy.     She understands and agrees with this plan. She knows the goal of treatment in her case is cure. She has been encouraged to call with any issues that might arise before her next visit here.   I spent 15 minutes counseling the patient face to face.  The total time spent in the appointment was 30 minutes.   LMinette Headland [phone] 07/07/2014 1:32 PM

## 2014-07-14 ENCOUNTER — Ambulatory Visit: Payer: BC Managed Care – PPO

## 2014-07-14 ENCOUNTER — Encounter: Payer: Self-pay | Admitting: Adult Health

## 2014-07-14 ENCOUNTER — Telehealth: Payer: Self-pay | Admitting: Oncology

## 2014-07-14 ENCOUNTER — Ambulatory Visit (HOSPITAL_BASED_OUTPATIENT_CLINIC_OR_DEPARTMENT_OTHER): Payer: BC Managed Care – PPO | Admitting: Adult Health

## 2014-07-14 ENCOUNTER — Other Ambulatory Visit (HOSPITAL_BASED_OUTPATIENT_CLINIC_OR_DEPARTMENT_OTHER): Payer: BC Managed Care – PPO

## 2014-07-14 VITALS — BP 142/85 | HR 103 | Temp 98.6°F | Resp 18 | Ht 65.0 in | Wt 253.0 lb

## 2014-07-14 DIAGNOSIS — I10 Essential (primary) hypertension: Secondary | ICD-10-CM

## 2014-07-14 DIAGNOSIS — C50511 Malignant neoplasm of lower-outer quadrant of right female breast: Secondary | ICD-10-CM

## 2014-07-14 DIAGNOSIS — Z171 Estrogen receptor negative status [ER-]: Secondary | ICD-10-CM

## 2014-07-14 DIAGNOSIS — C50811 Malignant neoplasm of overlapping sites of right female breast: Secondary | ICD-10-CM

## 2014-07-14 DIAGNOSIS — G629 Polyneuropathy, unspecified: Secondary | ICD-10-CM

## 2014-07-14 DIAGNOSIS — D649 Anemia, unspecified: Secondary | ICD-10-CM

## 2014-07-14 LAB — COMPREHENSIVE METABOLIC PANEL (CC13)
ALBUMIN: 3.8 g/dL (ref 3.5–5.0)
ALT: 27 U/L (ref 0–55)
AST: 18 U/L (ref 5–34)
Alkaline Phosphatase: 125 U/L (ref 40–150)
Anion Gap: 11 mEq/L (ref 3–11)
BUN: 22.6 mg/dL (ref 7.0–26.0)
CALCIUM: 8.9 mg/dL (ref 8.4–10.4)
CHLORIDE: 107 meq/L (ref 98–109)
CO2: 22 mEq/L (ref 22–29)
Creatinine: 1.3 mg/dL — ABNORMAL HIGH (ref 0.6–1.1)
GLUCOSE: 110 mg/dL (ref 70–140)
POTASSIUM: 4.4 meq/L (ref 3.5–5.1)
SODIUM: 140 meq/L (ref 136–145)
TOTAL PROTEIN: 7.2 g/dL (ref 6.4–8.3)
Total Bilirubin: 0.58 mg/dL (ref 0.20–1.20)

## 2014-07-14 LAB — CBC WITH DIFFERENTIAL/PLATELET
BASO%: 0.4 % (ref 0.0–2.0)
BASOS ABS: 0 10*3/uL (ref 0.0–0.1)
EOS ABS: 0 10*3/uL (ref 0.0–0.5)
EOS%: 0.5 % (ref 0.0–7.0)
HCT: 26.6 % — ABNORMAL LOW (ref 34.8–46.6)
HEMOGLOBIN: 8.6 g/dL — AB (ref 11.6–15.9)
LYMPH#: 0.5 10*3/uL — AB (ref 0.9–3.3)
LYMPH%: 11.4 % — ABNORMAL LOW (ref 14.0–49.7)
MCH: 28.1 pg (ref 25.1–34.0)
MCHC: 32.4 g/dL (ref 31.5–36.0)
MCV: 86.8 fL (ref 79.5–101.0)
MONO#: 0.6 10*3/uL (ref 0.1–0.9)
MONO%: 12 % (ref 0.0–14.0)
NEUT#: 3.6 10*3/uL (ref 1.5–6.5)
NEUT%: 75.7 % (ref 38.4–76.8)
Platelets: 93 10*3/uL — ABNORMAL LOW (ref 145–400)
RBC: 3.06 10*6/uL — AB (ref 3.70–5.45)
RDW: 17.5 % — ABNORMAL HIGH (ref 11.2–14.5)
WBC: 4.8 10*3/uL (ref 3.9–10.3)

## 2014-07-14 NOTE — Telephone Encounter (Signed)
[facility] APPT SCHEDULE FOR NOV. PER 11/6 POF F/U W/GM IN 2WS +/- A FEW DAYS - CX ALL OTHER APPTS

## 2014-07-14 NOTE — Progress Notes (Signed)
[name]  Telephone:(336) [phone] Fax:(336) [phone]     ID: RICCI PAFF OB: 16-Sep-1955  MR#: [id number]  WYO#:[id number]  PCP: Chesley Noon, MD GYN:   SU: Rolm Bookbinder OTHER MD:  CHIEF COMPLAINT: Triple negative breast cancer  CURRENT TREATMENT: Adjuvant chemotherapy  BREAST CANCER HISTORY: From the original intake note 01/18/2014:  The patient had routine bilateral screening mammography at the breast Center for 10/28/2013 showing a possible mass in the right breast. Additional views and right breast ultrasonography 12/20/2013 confirmed an irregular 1.3 cm right breast mass at the 6:00 position. This was not palpable. Ultrasound showed an irregular hypoechoic mass in the right breast measuring 1.4 cm. There was no right axillary adenopathy identified.  On 12/29/2013 the patient underwent right breast biopsy, showing ([id number]) and invasive ductal carcinoma, grade 3, estrogen and progesterone receptor negative, HER-2 not amplified with a signals ratio of 1.35 and a copy number per cell of 2.10, and an MIB-1 of 80%.  On 01/12/2014 the patient underwent bilateral breast MRI. This showed the mass in the right breast lower outer quadrant to measure 2.2 cm maximally. There was a 2 cm well circumscribed mass in the medial right breast which has been stable since 2011 him a and a 1 cm oval mass in the upper midline of the right breast, likely an intramammary lymph node, with benign features. A right axillary lymph node with cortical thickening measured 0.9 cm. The fatty hilum was possibly effaced; note Right axilla had been benign by [place] before biopsy. In the left breast there were no findings of concern.  The patient's subsequent history is as detailed below.  INTERVAL HISTORY: [name] returns today for follow up of her breast cancer. She is due for cycle 8 of abraxane and carboplatin today.  She did not receive treatment last week due to neuropathy.  At first  the neuropathy in her fingertips and toes was improved on Gabapentin.  This week it is unchanged.  It remains constant, but she doesn't have any motor changes due to this.  She denies fevers, chills, nausea, vomiting, constipation, skin change or further concerns.   REVIEW OF SYSTEMS: A detailed review of systems is otherwise noncontributory, except where noted above.     PAST MEDICAL HISTORY: Past Medical History  Diagnosis Date  . HTN (hypertension)   . Diabetes mellitus   . High cholesterol   . Breast cancer   . Family history of malignant neoplasm of breast   . Glaucoma   . Wears glasses   . Wears dentures     full top-partial bottom    PAST SURGICAL HISTORY: Past Surgical History  Procedure Laterality Date  . Transphenoidal pituitary resection  2006  . Abdominal hysterectomy  1993  . Breast biopsy  2000    LEFT  . Tonsillectomy    . Portacath placement Left 02/08/2014    Procedure: INSERTION PORT-A-CATH;  Surgeon: Rolm Bookbinder, MD;  Location: [facility];  Service: General;  Laterality: Left;    FAMILY HISTORY Family History  Problem Relation Age of Onset  . Hypertension Mother   . Diabetes type II Mother   . Breast cancer Mother     dx ~53; deceased 77  . Hypertension Father   . Breast cancer Maternal Aunt     deceased 55  . Cancer Maternal Uncle     unk. type; deceased 17s  . Cancer Maternal Uncle     unk. type; deceased late 19s  .  Uterine cancer Cousin 19    daughter of an unaffected mat aunt who is 7   the patient's father is in his early 39s. The patient's mother died at the age of 67, from complications of breast cancer which had been diagnosed "years before". The patient had 2 brothers, 2 sisters. The only other breast cancer in the family was a maternal aunt who died from breast cancer at the age of 25. (This means that 2 of 3 sisters, including the patient's mother, had breast cancer, one of them before the age of 20). There is no  history of ovarian cancer in the family.  GYNECOLOGIC HISTORY:  Menarche age 22, first live birth age 3. The patient is GX P1. She underwent total abdominal hysterectomy with bilateral salpingo-oophorectomy at age 19. She took hormone replacement for one or 2 months old.  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 The patient is a widow. She lives by herself, with no pets. Her daughter [name] lives in [place], where she works in a Geneticist, molecular. The patient has no grandchildren. She is a Psychologist, forensic   ADVANCED DIRECTIVES: Not in place; advanced directives were discussed with the patient at her initial visit 01/18/2014. She intends to name her daughter as healthcare power of attorney. [name] can be reached at [phone]   HEALTH MAINTENANCE: History  Substance Use Topics  . Smoking status: Former Smoker    Types: Cigarettes    Quit date: 01/31/1994  . Smokeless tobacco: Not on file  . Alcohol Use: Yes     Comment: occ     Colonoscopy:  PAP: Status post hysterectomy  Bone density: Remote  Lipid panel:  No Known Allergies  Current Outpatient Prescriptions  Medication Sig Dispense Refill  . ALPRAZolam (XANAX) 0.5 MG tablet Take 0.5 mg by mouth.    Marland Kitchen amLODipine (NORVASC) 10 MG tablet TAKE ONE TABLET BY MOUTH ONCE DAILY    . Atorvastatin Calcium (LIPITOR PO) Take 40 mg by mouth daily.     Marland Kitchen dexamethasone (DECADRON) 0.1 % ophthalmic solution Place 1 drop into both eyes daily.    Marland Kitchen gabapentin (NEURONTIN) 100 MG capsule Take 1 capsule (100 mg total) by mouth 3 (three) times daily. 90 capsule 0  . glucose monitoring kit (FREESTYLE) monitoring kit One Touch Ultra machine and strips, patient tests her blood sugars bid    . glyBURIDE (DIABETA) 5 MG tablet Take 5  mg by mouth daily with breakfast.     . latanoprost (XALATAN) 0.005 % ophthalmic solution Place 1 drop into both eyes at bedtime.    . lidocaine-prilocaine (EMLA) cream Apply topically.    Marland Kitchen lisinopril-hydrochlorothiazide (PRINZIDE,ZESTORETIC) 20-12.5 MG per tablet Take 2 tablets by mouth daily.     Marland Kitchen LORazepam (ATIVAN) 0.5 MG tablet Take 1 tablet (0.5 mg total) by mouth at bedtime as needed (Nausea or vomiting). 30 tablet 0  . metFORMIN (GLUCOPHAGE-XR) 500 MG 24 hr tablet Take 500 mg by mouth 2 (two) times daily.     . ondansetron (ZOFRAN) 8 MG tablet Take 1 tablet (8 mg total) by mouth every 8 (eight) hours as needed for nausea or vomiting. 60 tablet 2  . ONE TOUCH ULTRA TEST test strip     . oxyCODONE-acetaminophen (PERCOCET) 10-325 MG per tablet Take  1 tablet by mouth every 6 (six) hours as needed for pain. 20 tablet 0  . tiZANidine (ZANAFLEX) 4 MG tablet 4 mg at bedtime.      No current facility-administered medications for this visit.    OBJECTIVE:  Filed Vitals:   07/14/14 1318  BP: 142/85  Pulse: 103  Temp: 98.6 F (37 C)  Resp: 18     Body mass index is 42.1 kg/(m^2).   GENERAL: Patient is a well appearing female in no acute distress HEENT:  Sclerae anicteric.  Oropharynx clear and moist. No ulcerations or evidence of oropharyngeal candidiasis. Neck is supple.  NODES:  No cervical, supraclavicular, or axillary lymphadenopathy palpated.  BREAST EXAM:  Deferred. LUNGS:  Clear to auscultation bilaterally.  No wheezes or rhonchi. HEART:  Regular rate and rhythm. No murmur appreciated. ABDOMEN:  Soft, nontender.  Positive, normoactive bowel sounds. No organomegaly palpated. MSK:  No focal spinal tenderness to palpation. Full range of motion bilaterally in the upper extremities. EXTREMITIES:  No peripheral edema.   SKIN:  Clear with no obvious rashes or skin changes. No nail dyscrasia. NEURO:  Nonfocal. Well oriented.  Appropriate affect. ECOG FS:1 - Symptomatic but completely  ambulatory   LAB RESULTS:  CMP  I No results found for: SPEP  Lab Results  Component Value Date   WBC 3.4* 07/07/2014   NEUTROABS 2.7 07/07/2014   HGB 9.5* 07/07/2014   HCT 29.4* 07/07/2014   MCV 86.3 07/07/2014   PLT 152 07/07/2014      Chemistry      Component Value Date/Time   NA 137 07/07/2014 1303   NA 141 02/03/2014 1400   K 4.3 07/07/2014 1303   K 4.4 02/03/2014 1400   CL 100 02/03/2014 1400   CO2 18* 07/07/2014 1303   CO2 24 02/03/2014 1400   BUN 25.2 07/07/2014 1303   BUN 20 02/03/2014 1400   CREATININE 1.2* 07/07/2014 1303   CREATININE 1.16* 02/03/2014 1400   GLU 353* 04/07/2014 1113      Component Value Date/Time   CALCIUM 9.0 07/07/2014 1303   CALCIUM 10.0 02/03/2014 1400   ALKPHOS 116 07/07/2014 1303   ALKPHOS 114 02/03/2014 1400   AST 24 07/07/2014 1303   AST 15 02/03/2014 1400   ALT 32 07/07/2014 1303   ALT 15 02/03/2014 1400   BILITOT 0.52 07/07/2014 1303   BILITOT 0.3 02/03/2014 1400       Lab Results  Component Value Date   LABCA2 51* 02/03/2014    No components found for: [id number]  No results for input(s): INR in the last 168 hours.  Urinalysis No results found for: COLORURINE  STUDIES: No results found.  ASSESSMENT: 58 y.o. BRCA negative [place] woman status post right breast biopsy 12/29/2013 for a clinical T2 N1, stage IIB invasive ductal carcinoma, grade 3, triple negative, with an MIB-1 of 80%.  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  Due to her neuropathy, she will not receive any further chemotherapy.  She will return in 2 weeks for labs and evaluation by Dr. MJana Hakim    The above plan was reviewed with Dr. MJana Hakimin detail.     She understands and agrees with this plan. She knows the goal of treatment in her case is cure. She has been encouraged to call with any issues that might arise before her next visit here.   I spent 15 minutes counseling the patient face to face.  The total time spent in the appointment was 30 minutes.   LMinette Headland [phone]/02/2014 1:22 PM

## 2014-07-21 ENCOUNTER — Ambulatory Visit: Payer: BC Managed Care – PPO | Admitting: Adult Health

## 2014-07-21 ENCOUNTER — Other Ambulatory Visit: Payer: BC Managed Care – PPO

## 2014-07-21 ENCOUNTER — Ambulatory Visit: Payer: BC Managed Care – PPO

## 2014-07-26 ENCOUNTER — Other Ambulatory Visit (HOSPITAL_BASED_OUTPATIENT_CLINIC_OR_DEPARTMENT_OTHER): Payer: BC Managed Care – PPO

## 2014-07-26 ENCOUNTER — Ambulatory Visit (HOSPITAL_BASED_OUTPATIENT_CLINIC_OR_DEPARTMENT_OTHER): Payer: BC Managed Care – PPO | Admitting: Oncology

## 2014-07-26 VITALS — BP 141/76 | HR 105 | Temp 98.5°F | Resp 18 | Ht 65.0 in | Wt 251.3 lb

## 2014-07-26 DIAGNOSIS — C50811 Malignant neoplasm of overlapping sites of right female breast: Secondary | ICD-10-CM

## 2014-07-26 DIAGNOSIS — D649 Anemia, unspecified: Secondary | ICD-10-CM

## 2014-07-26 DIAGNOSIS — C50511 Malignant neoplasm of lower-outer quadrant of right female breast: Secondary | ICD-10-CM

## 2014-07-26 DIAGNOSIS — T451X5A Adverse effect of antineoplastic and immunosuppressive drugs, initial encounter: Secondary | ICD-10-CM

## 2014-07-26 DIAGNOSIS — D6481 Anemia due to antineoplastic chemotherapy: Secondary | ICD-10-CM

## 2014-07-26 LAB — CBC WITH DIFFERENTIAL/PLATELET
BASO%: 0.2 % (ref 0.0–2.0)
Basophils Absolute: 0 10e3/uL (ref 0.0–0.1)
EOS%: 2.1 % (ref 0.0–7.0)
Eosinophils Absolute: 0.1 10e3/uL (ref 0.0–0.5)
HCT: 23.2 % — ABNORMAL LOW (ref 34.8–46.6)
HGB: 7.6 g/dL — ABNORMAL LOW (ref 11.6–15.9)
LYMPH%: 19.3 % (ref 14.0–49.7)
MCH: 29 pg (ref 25.1–34.0)
MCHC: 32.8 g/dL (ref 31.5–36.0)
MCV: 88.5 fL (ref 79.5–101.0)
MONO#: 0.3 10e3/uL (ref 0.1–0.9)
MONO%: 7 % (ref 0.0–14.0)
NEUT#: 3.1 10e3/uL (ref 1.5–6.5)
NEUT%: 71.4 % (ref 38.4–76.8)
Platelets: 74 10e3/uL — ABNORMAL LOW (ref 145–400)
RBC: 2.62 10e6/uL — ABNORMAL LOW (ref 3.70–5.45)
RDW: 19.1 % — ABNORMAL HIGH (ref 11.2–14.5)
WBC: 4.3 10e3/uL (ref 3.9–10.3)
lymph#: 0.8 10e3/uL — ABNORMAL LOW (ref 0.9–3.3)

## 2014-07-26 LAB — COMPREHENSIVE METABOLIC PANEL (CC13)
ALT: 22 U/L (ref 0–55)
AST: 22 U/L (ref 5–34)
Albumin: 3.8 g/dL (ref 3.5–5.0)
Alkaline Phosphatase: 128 U/L (ref 40–150)
Anion Gap: 12 mEq/L — ABNORMAL HIGH (ref 3–11)
BUN: 22.3 mg/dL (ref 7.0–26.0)
CALCIUM: 9.6 mg/dL (ref 8.4–10.4)
CHLORIDE: 107 meq/L (ref 98–109)
CO2: 22 mEq/L (ref 22–29)
Creatinine: 1.5 mg/dL — ABNORMAL HIGH (ref 0.6–1.1)
Glucose: 100 mg/dl (ref 70–140)
Potassium: 4.5 mEq/L (ref 3.5–5.1)
Sodium: 142 mEq/L (ref 136–145)
Total Bilirubin: 0.6 mg/dL (ref 0.20–1.20)
Total Protein: 7.5 g/dL (ref 6.4–8.3)

## 2014-07-26 NOTE — Progress Notes (Signed)
[facility]  Telephone:(336) [phone] Fax:(336) [phone]     ID: [name] OB: 12/25/1955  MR#: [id number]  PYK#:[id number]  PCP: Penny Noon, MD GYN:   SU: [name] OTHER MD:  CHIEF COMPLAINT: Triple negative breast cancer  CURRENT TREATMENT: awaiting adjuvant radiation therapy  BREAST CANCER HISTORY: From the original intake note 01/18/2014:  The patient had routine bilateral screening mammography at the breast Center for 10/28/2013 showing a possible mass in the right breast. Additional views and right breast ultrasonography 12/20/2013 confirmed an irregular 1.3 cm right breast mass at the 6:00 position. This was not palpable. Ultrasound showed an irregular hypoechoic mass in the right breast measuring 1.4 cm. There was no right axillary adenopathy identified.  On 12/29/2013 the patient underwent right breast biopsy, showing ([id number]) and invasive ductal carcinoma, grade 3, estrogen and progesterone receptor negative, HER-2 not amplified with a signals ratio of 1.35 and a copy number per cell of 2.10, and an MIB-1 of 80%.  On 01/12/2014 the patient underwent bilateral breast MRI. This showed the mass in the right breast lower outer quadrant to measure 2.2 cm maximally. There was a 2 cm well circumscribed mass in the medial right breast which has been stable since 2011 him a and a 1 cm oval mass in the upper midline of the right breast, likely an intramammary lymph node, with benign features. A right axillary lymph node with cortical thickening measured 0.9 cm. The fatty hilum was possibly effaced; note Right axilla had been benign by [place] before biopsy. In the left breast there were no findings of concern.  The patient's subsequent history is as detailed below.  INTERVAL HISTORY: [name] returns today for follow up of her breast cancer. She had her last carboplatin/Abraxane dose on 06/30/2014. That was her 7 of 12 planned doses. The remaining doses  had to be canceled because she started to develop significant neuropathy in her fingers and toes. That is now better although not completely gone. It is grade 1. She is taking gabapentin which is helping. The dose was recently increased to 3 times a day.  REVIEW OF SYSTEMS: She tolerated the chemotherapy "better than I expected", but had more trouble actually with the weekly treatments and the earlier dose dense once. She has developed a rash over the left forearm, a little bit over the right forearm as well, but no swelling. She tells me her energy is not "fine", she never lost her sense of taste, has a good appetite, and normal bowel and bladder function. Hot flashes are rare. Vaginal dryness is not an issue. A detailed review of systems today was otherwise stable    PAST MEDICAL HISTORY: Past Medical History  Diagnosis Date  . HTN (hypertension)   . Diabetes mellitus   . High cholesterol   . Breast cancer   . Family history of malignant neoplasm of breast   . Glaucoma   . Wears glasses   . Wears dentures     full top-partial bottom    PAST SURGICAL HISTORY: Past Surgical History  Procedure Laterality Date  . Transphenoidal pituitary resection  2006  . Abdominal hysterectomy  1993  . Breast biopsy  2000    LEFT  . Tonsillectomy    . Portacath placement Left 02/08/2014    Procedure: INSERTION PORT-A-CATH;  Surgeon: Penny Bookbinder, MD;  Location: [facility];  Service: General;  Laterality: Left;    FAMILY HISTORY Family History  Problem Relation Age of Onset  .  Hypertension Mother   . Diabetes type II Mother   . Breast cancer Mother     dx ~73; deceased 60  . Hypertension Father   . Breast cancer Maternal Aunt     deceased 63  . Cancer Maternal Uncle     unk. type; deceased 37s  . Cancer Maternal Uncle     unk. type; deceased late 9s  . Uterine cancer Cousin 90    daughter of an unaffected mat aunt who is 33   the patient's father is in his early 56s.  The patient's mother died at the age of 60, from complications of breast cancer which had been diagnosed "years before". The patient had 2 brothers, 2 sisters. The only other breast cancer in the family was a maternal aunt who died from breast cancer at the age of 81. (This means that 2 of 3 sisters, including the patient's mother, had breast cancer, one of them before the age of 25). There is no history of ovarian cancer in the family.  GYNECOLOGIC HISTORY:  Menarche age 39, first live birth age 43. The patient is GX P1. She underwent total abdominal hysterectomy with bilateral salpingo-oophorectomy at age 47. She took hormone replacement for one or 2 months old.  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 The patient is a widow. She lives by herself, with no pets. Her daughter [name] lives in [place], where she works in a Geneticist, molecular. The patient has no grandchildren. She is a Psychologist, forensic   ADVANCED DIRECTIVES: Not in place; advanced directives were discussed with the patient at her initial visit 01/18/2014. She intends to name her daughter as healthcare power of attorney. [name] can be reached at [phone]   HEALTH MAINTENANCE: History  Substance Use Topics  . Smoking status: Former Smoker    Types: Cigarettes    Quit date: 01/31/1994  . Smokeless tobacco: Not on file  . Alcohol Use: Yes     Comment: occ     Colonoscopy:  PAP: Status post hysterectomy  Bone density: Remote  Lipid panel:  No Known Allergies  Current Outpatient Prescriptions  Medication Sig Dispense Refill  . ALPRAZolam (XANAX) 0.5 MG tablet Take 0.5 mg by mouth.    Marland Kitchen amLODipine (NORVASC) 10 MG tablet TAKE ONE TABLET BY MOUTH ONCE DAILY    . Atorvastatin Calcium (LIPITOR PO) Take 40 mg  by mouth daily.     Marland Kitchen dexamethasone (DECADRON) 0.1 % ophthalmic solution Place 1 drop into both eyes daily.    Marland Kitchen gabapentin (NEURONTIN) 100 MG capsule Take 1 capsule (100 mg total) by mouth 3 (three) times daily. 90 capsule 0  . glucose monitoring kit (FREESTYLE) monitoring kit One Touch Ultra machine and strips, patient tests her blood sugars bid    . glyBURIDE (DIABETA) 5 MG tablet Take 5 mg by mouth daily with breakfast.     . latanoprost (XALATAN) 0.005 % ophthalmic solution Place 1 drop into both eyes at bedtime.    . lidocaine-prilocaine (EMLA) cream Apply topically.    Marland Kitchen lisinopril-hydrochlorothiazide (PRINZIDE,ZESTORETIC) 20-12.5 MG per tablet Take 2 tablets by mouth daily.     Marland Kitchen LORazepam (ATIVAN) 0.5 MG tablet Take 1 tablet (0.5 mg total) by mouth at bedtime as needed (Nausea or vomiting). 30 tablet 0  .  metFORMIN (GLUCOPHAGE-XR) 500 MG 24 hr tablet Take 500 mg by mouth 2 (two) times daily.     . ondansetron (ZOFRAN) 8 MG tablet Take 1 tablet (8 mg total) by mouth every 8 (eight) hours as needed for nausea or vomiting. 60 tablet 2  . ONE TOUCH ULTRA TEST test strip     . oxyCODONE-acetaminophen (PERCOCET) 10-325 MG per tablet Take 1 tablet by mouth every 6 (six) hours as needed for pain. 20 tablet 0  . tiZANidine (ZANAFLEX) 4 MG tablet 4 mg at bedtime.      No current facility-administered medications for this visit.    OBJECTIVE:  Middle-aged African-American woman who appears younger than stated age 57 Vitals:   07/26/14 1550  BP: 141/76  Pulse: 105  Temp: 98.5 F (36.9 C)  Resp: 18    ECOG FS:1 - Symptomatic but completely ambulatory   Sclerae unicteric, pupils round and equal Oropharynx clear and moist No cervical or supraclavicular adenopathy Lungs no rales or rhonchi Heart regular rate and rhythm Abd soft, obese,nontender, positive bowel sounds MSK no focal spinal tenderness, no upper extremity lymphedema Neuro: nonfocal, well oriented, positive affect Breasts:  The right breast is status post lumpectomy. There is minimal distortion. There is no evidence of local recurrence. The right axilla is benign. The left breast is unremarkable Skin: There is some hyperpigmentation in particular over the dorsum of the hands. There is some roughness and scaling in the left dorsal forearms, minimally so on the right side. "Spots on the tongue" (hyperpigmentation) are clearing.   LAB RESULTS:  CMP  I No results found for: SPEP  Lab Results  Component Value Date   WBC 4.3 07/26/2014   NEUTROABS 3.1 07/26/2014   HGB 7.6* 07/26/2014   HCT 23.2* 07/26/2014   MCV 88.5 07/26/2014   PLT 74* 07/26/2014      Chemistry      Component Value Date/Time   NA 140 07/14/2014 1306   NA 141 02/03/2014 1400   K 4.4 07/14/2014 1306   K 4.4 02/03/2014 1400   CL 100 02/03/2014 1400   CO2 22 07/14/2014 1306   CO2 24 02/03/2014 1400   BUN 22.6 07/14/2014 1306   BUN 20 02/03/2014 1400   CREATININE 1.3* 07/14/2014 1306   CREATININE 1.16* 02/03/2014 1400   GLU 353* 04/07/2014 1113      Component Value Date/Time   CALCIUM 8.9 07/14/2014 1306   CALCIUM 10.0 02/03/2014 1400   ALKPHOS 125 07/14/2014 1306   ALKPHOS 114 02/03/2014 1400   AST 18 07/14/2014 1306   AST 15 02/03/2014 1400   ALT 27 07/14/2014 1306   ALT 15 02/03/2014 1400   BILITOT 0.58 07/14/2014 1306   BILITOT 0.3 02/03/2014 1400       Lab Results  Component Value Date   LABCA2 51* 02/03/2014    No components found for: [id number]  No results for input(s): INR in the last 168 hours.  Urinalysis No results found for: COLORURINE  STUDIES: No results found.  ASSESSMENT: 58 y.o. BRCA negative [place] woman status post right breast biopsy 12/29/2013 for a clinical T2 N1, stage IIB invasive ductal carcinoma, grade 3, triple negative, with an MIB-1 of 80%.  (1) status post right lumpectomy and sentinel lymph node sampling 02/08/2014 for a pT2 pN0, stage IIA invasive ductal carcinoma, grade 3,  repeat prognostic panel again triple negative  (2) adjuvant chemotherapy with doxorubicin and cyclophosphamide in dose dense fashion x4 completed 04/13/2014,  followed by carboplatin and  Abraxane weekly x7 completed 06/30/2014  (a) additional 5 planned cycles cancelled because of neuropathy  (3) adjuvant radiation to follow chemotherapy  (4) status post remote TAH BSO   (5) Status post removal of a pituitary adenoma 09/05/2005, no malignancy identified  (6) moderately controlled hypertension  (7) Anemia - last requiring blood transfusion on 06/24/14  PLAN: Crissa work hard to get through her chemotherapy and got through it thankfully with no major complications. She does have some residual neuropathy. This is grade 1, but hopefully will continue to improve or the next several months. The hyperpigmentation of the skin will continue to clear, although that can take a significant amount of time.  Nest step is radiation. Because her cousin, Lattie Haw comes 18, is being treated by Dr. Isidore Moos, she would like to be evaluated by the same radiation oncologist and I have placed that request today. She understands radiation is a form of local treatment and complements the prior surgery. I expect she will be done sometime in January or early February. Accordingly she will return to see me in March.  Elley has a good understanding of the overall plan. She agrees with it. She knows the goal of treatment in her case is cure. She will call with any problems that may develop before her next visit here.  Chauncey Cruel, MD  07/26/2014 4:04 PM

## 2014-08-10 ENCOUNTER — Encounter: Payer: Self-pay | Admitting: Radiation Oncology

## 2014-08-10 NOTE — Progress Notes (Signed)
Location of Breast Cancer: right lower outer  Histology per Pathology Report:  02/08/14 Diagnosis 1. Breast, lumpectomy, Right - INVASIVE DUCTAL CARCINOMA, MSBR GRADE III, 2.1 CM. - MARGINS NOT INVOLVED. - CLOSEST MARGIN INFERIOR AT 0.3 CM. 2. Lymph node, sentinel, biopsy, Right axillary - BENIGN ADIPOSE TISSUE.  NO LYMPHOID TISSUE OR MALIGNANCY. 3. Lymph node, sentinel, biopsy, Additional right axillary tissue - ONE BENIGN LYMPH NODE (0/1).  12/29/13 Diagnosis Breast, right, needle core biopsy, 6:00 - INVASIVE DUCTAL CARCINOMA.  Receptor Status: ER(0%), PR (0%), Her2-neu (-)  Did patient present with symptoms or was this found on screening mammography?: screening mammogram  Past/Anticipated interventions by surgeon, if any: Dr Donne Hazel-  lumpectomy with node sampling  Past/Anticipated interventions by medical oncology, if any: Chemotherapy Dr Jana Hakim:  adjuvant chemotherapy with doxorubicin and cyclophosphamide in dose dense fashion x4 completed 04/13/2014, followed by carboplatin and Abraxane weekly x7 completed 06/30/2014.  additional 5 planned cycles cancelled because of neuropathy  (3) adjuvant radiation to follow chemotherapy  Lymphedema issues, if any:    no  Pain issues, if any:   no  SAFETY ISSUES:  Prior radiation? no  Pacemaker/ICD? no  Possible current pregnancy? no  Is the patient on methotrexate? no  Current Complaints / other details:  Widow, Radio broadcast assistant, worked part-time in [employer], 1 daughter menarche age 29, first live birth age 50, P11, TAH-BSO age 10, HRT x 1-2 mos     Jacobo Forest, Verneita Griffes, RN 08/10/2014,2:39 PM

## 2014-08-11 ENCOUNTER — Ambulatory Visit
Admission: RE | Admit: 2014-08-11 | Discharge: 2014-08-11 | Disposition: A | Discharge status: Home / Self Care | Payer: BLUE CROSS/BLUE SHIELD | Source: Ambulatory Visit | Attending: Radiation Oncology | Admitting: Radiation Oncology

## 2014-08-11 ENCOUNTER — Encounter: Payer: Self-pay | Admitting: Radiation Oncology

## 2014-08-11 VITALS — BP 141/73 | HR 103 | Temp 99.1°F | Resp 20 | Ht 65.0 in | Wt 250.0 lb

## 2014-08-11 DIAGNOSIS — C50511 Malignant neoplasm of lower-outer quadrant of right female breast: Secondary | ICD-10-CM

## 2014-08-11 NOTE — Progress Notes (Signed)
Please see the Nurse Progress Note in the MD Initial Consult Encounter for this patient. 

## 2014-08-11 NOTE — Progress Notes (Signed)
  Radiation Oncology         (336) [phone] ________________________________  Name: [name] MRN: [id number]  Date: 08/11/2014  DOB: [birth year]  SIMULATION AND TREATMENT PLANNING NOTE  Outpatient  DIAGNOSIS:     ICD-9-CM ICD-10-CM   1. Breast cancer of lower-outer quadrant of right female breast 174.5 C50.511     NARRATIVE:  The patient was brought to the [facility].  Identity was confirmed.  All relevant records and images related to the planned course of therapy were reviewed.  The patient freely provided informed written consent to proceed with treatment after reviewing the details related to the planned course of therapy. The consent form was witnessed and verified by the simulation staff.    Then, the patient was set-up in a stable reproducible  prone position for radiation therapy on prone breast board with guaze at inframammary fold.  CT images were obtained.  Surface markings were placed.  The CT images were loaded into the planning software.    TREATMENT PLANNING NOTE: Treatment planning then occurred.  The radiation prescription was entered and confirmed.    A total of 2 medically necessary complex treatment devices were fabricated and supervised by me. 2 tangents with MLCs to block heart and lungs.  I have requested : 3D Simulation  I have requested a DVH of the following structures: heart, lung, lump cav.    The patient will receive 50.4 Gy in 28 fractions to the right breast with tangents. This will be followed by a boost of 10 Gy in 5 fractions.  Optical Surface Tracking Plan:  Since intensity modulated radiotherapy (IMRT) and 3D conformal radiation treatment methods are predicated on accurate and precise positioning for treatment, intrafraction motion monitoring is medically necessary to ensure accurate and safe treatment delivery. The ability to quantify intrafraction motion without excessive ionizing radiation dose can only be performed with optical  surface tracking. Accordingly, surface imaging offers the opportunity to obtain 3D measurements of patient position throughout IMRT and 3D treatments without excessive radiation exposure. I am ordering optical surface tracking to be used for this patient's upcoming course of radiotherapy.  ________________________________   Reference:  Ursula Alert, J, et al. Surface imaging-based analysis of intrafraction motion for breast radiotherapy patients.Journal of [place], n. 6, nov. 2014. ISSN [id number].  Available at: <http://www.jacmp.org/index.php/jacmp/article/view/4957>.     -----------------------------------  Eppie Gibson, MD

## 2014-08-11 NOTE — Progress Notes (Signed)
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 Breast cancer of lower-outer quadrant of right female breast 174.5 C50.511     HISTORY OF PRESENT ILLNESS::[name] is a 58 y.o. female who presented with right breast abnormality on screening mammogram. Bx of right breast showed IDC. MRI of breasts showed borderline questionable R axillary node, but this was not biopsied. She has a family history for breast cancer per her mother and aunt, but her genetic testing was negative.  Lumpectomy and SLN in June revealed triple negative grade III 2.1cm IDC tumor, margins negative by 0.3cm, SLN negative.  She completed chemotherapy (carboplatin and abraxane) in the end of Oct - cycles cut short due to neuropathy. 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  PREVIOUS RADIATION THERAPY: No  PAST MEDICAL HISTORY:  has a past medical history of HTN (hypertension); Diabetes mellitus; High cholesterol; Family history of malignant neoplasm of breast; Glaucoma; Wears glasses; Wears dentures; and Breast cancer (12/29/13).    PAST SURGICAL HISTORY: Past Surgical History  Procedure Laterality Date  . Transphenoidal pituitary resection  2006  . Breast biopsy  2000    LEFT  . Tonsillectomy    . Portacath placement Left 02/08/2014    Procedure: INSERTION PORT-A-CATH;  Surgeon: Rolm Bookbinder, MD;  Location: [facility];  Service: General;  Laterality: Left;  . Breast lumpectomy with axillary lymph node biopsy Right 02/08/14    invasive ductal  . Abdominal hysterectomy  1993   endometriosis    FAMILY HISTORY: family history includes Breast cancer in her maternal aunt and mother; Cancer in her maternal uncle and maternal uncle; Diabetes type II in her mother; Hypertension in her father and mother; Uterine cancer (age of onset: 30) in her cousin.  SOCIAL HISTORY:  reports that she quit smoking about 20 years ago. Her smoking use included Cigarettes. She smoked 0.00 packs per day. She does not have any smokeless tobacco history on file. She reports that she drinks alcohol. She reports that she does not use illicit drugs.  ALLERGIES: Review of patient's allergies indicates no known allergies.  MEDICATIONS:  Current Outpatient Prescriptions  Medication Sig Dispense Refill  . amLODipine (NORVASC) 10 MG tablet TAKE ONE TABLET BY MOUTH ONCE DAILY    . Atorvastatin Calcium (LIPITOR PO) Take 40 mg by mouth daily.     Marland Kitchen dexamethasone (DECADRON) 0.1 % ophthalmic solution Place 1 drop into both eyes daily.    Marland Kitchen gabapentin (NEURONTIN) 300 MG capsule Take 300 mg by mouth.    Marland Kitchen glucose monitoring kit (FREESTYLE) monitoring kit One Touch Ultra machine and strips, patient tests her blood sugars bid    . glyBURIDE (DIABETA) 5 MG tablet Take 5 mg by mouth daily with breakfast.     . lidocaine-prilocaine (EMLA) cream Apply topically.    Marland Kitchen lisinopril-hydrochlorothiazide (PRINZIDE,ZESTORETIC) 20-12.5 MG per tablet Take 2 tablets by mouth daily.     Marland Kitchen LORazepam (ATIVAN) 0.5 MG tablet Take 1 tablet (0.5 mg total) by mouth at bedtime as needed (Nausea or vomiting). 30 tablet 0  . metFORMIN (GLUCOPHAGE-XR)  500 MG 24 hr tablet Take 500 mg by mouth 2 (two) times daily.     . ondansetron (ZOFRAN) 8 MG tablet Take 1 tablet (8 mg total) by mouth every 8 (eight) hours as needed for nausea or vomiting. 60 tablet 2  . ONE TOUCH ULTRA TEST test strip     . tiZANidine (ZANAFLEX) 4 MG tablet 4 mg at bedtime.      No current facility-administered medications for this encounter.    REVIEW OF  SYSTEMS:  Notable for that above.   PHYSICAL EXAM:  height is $RemoveB'5\' 5"'ylWdmLcj$  (1.651 m) and weight is 250 lb (113.399 kg). Her oral temperature is 99.1 F (37.3 C). Her blood pressure is 141/73 and her pulse is 103. Her respiration is 20.   General: Alert and oriented, in no acute distress HEENT: Head is normocephalic. Extraocular movements are intact.  Neck: Neck is supple, no palpable cervical or supraclavicular lymphadenopathy. Heart: Regular in rate and rhythm with no murmurs, rubs, or gallops. Chest: Clear to auscultation bilaterally, with no rhonchi, wheezes, or rales. Abdomen: Soft, nontender, nondistended, with no rigidity or guarding. Extremities: No cyanosis or edema. Lymphatics: see Neck/breast Exam Skin: No concerning lesions. Musculoskeletal: symmetric strength and muscle tone throughout. Neurologic: Cranial nerves II through XII are grossly intact. No obvious focalities. Speech is fluent. Coordination is intact. Psychiatric: Judgment and insight are intact. Affect is appropriate. Breasts:  No lesions palpated in breasts or axillary regions bilaterally. Right breast lumpectomy scar is subtle at 6:00 and well healed   ECOG = 0  0 - Asymptomatic (Fully active, able to carry on all predisease activities without restriction)  1 - Symptomatic but completely ambulatory (Restricted in physically strenuous activity but ambulatory and able to carry out work of a light or sedentary nature. For example, light housework, office work)  2 - Symptomatic, <50% in bed during the day (Ambulatory and capable of all self care but unable to carry out any work activities. Up and about more than 50% of waking hours)  3 - Symptomatic, >50% in bed, but not bedbound (Capable of only limited self-care, confined to bed or chair 50% or more of waking hours)  4 - Bedbound (Completely disabled. Cannot carry on any self-care. Totally confined to bed or chair)  5 - Death   Eustace Pen MM, Creech RH, Tormey DC, et al.  [phone]). "Toxicity and response criteria of the [facility]". [place] Oncol. 5 (6): 649-55   LABORATORY DATA:  Lab Results  Component Value Date   WBC 4.3 07/26/2014   HGB 7.6* 07/26/2014   HCT 23.2* 07/26/2014   MCV 88.5 07/26/2014   PLT 74* 07/26/2014   CMP     Component Value Date/Time   NA 142 07/26/2014 1548   NA 141 02/03/2014 1400   K 4.5 07/26/2014 1548   K 4.4 02/03/2014 1400   CL 100 02/03/2014 1400   CO2 22 07/26/2014 1548   CO2 24 02/03/2014 1400   GLUCOSE 100 07/26/2014 1548   GLUCOSE 105* 02/03/2014 1400   BUN 22.3 07/26/2014 1548   BUN 20 02/03/2014 1400   CREATININE 1.5* 07/26/2014 1548   CREATININE 1.16* 02/03/2014 1400   CALCIUM 9.6 07/26/2014 1548   CALCIUM 10.0 02/03/2014 1400   PROT 7.5 07/26/2014 1548   PROT 8.5* 02/03/2014 1400   ALBUMIN 3.8 07/26/2014 1548   ALBUMIN 4.1 02/03/2014 1400   AST 22 07/26/2014 1548   AST 15 02/03/2014 1400   ALT 22 07/26/2014  1548   ALT 15 02/03/2014 1400   ALKPHOS 128 07/26/2014 1548   ALKPHOS 114 02/03/2014 1400   BILITOT 0.60 07/26/2014 1548   BILITOT 0.3 02/03/2014 1400   GFRNONAA 52* 02/03/2014 1400   GFRAA 60* 02/03/2014 1400         RADIOGRAPHY: No results found.    IMPRESSION/PLAN: pathologic T2N0M0 right breast triple negative grade III IDC.  It was a pleasure meeting the patient today. We discussed the risks, benefits, and side effects of radiotherapy to her right breast, which should decrease chance of local regional recurrence by 2/3. We discussed that radiation would take approximately 6-7 weeks to complete. I will simulate her today, in the prone position, given her large breast size. We spoke about side effects including skin irritation, cosmetic effects and fatigue as well as much less common late effects including lung irritation. We spoke about the latest technology that is used to minimize the risk of late effects for breast cancer patients undergoing radiotherapy. No  guarantees of treatment were given. The patient is enthusiastic about proceeding with treatment. I look forward to participating in the patient's care. __________________________________________   Eppie Gibson, MD

## 2014-08-11 NOTE — Addendum Note (Signed)
Encounter addended by: Andria Rhein, RN on: 08/11/2014 10:33 AM<BR>     Documentation filed: Flowsheet VN, Charges VN

## 2014-08-11 NOTE — Addendum Note (Signed)
Encounter addended by: Eppie Gibson, MD on: 08/11/2014  9:46 AM<BR>     Documentation filed: Follow-up Section, LOS Section

## 2014-08-14 DIAGNOSIS — C50511 Malignant neoplasm of lower-outer quadrant of right female breast: Secondary | ICD-10-CM | POA: Diagnosis not present

## 2014-08-15 DIAGNOSIS — C50511 Malignant neoplasm of lower-outer quadrant of right female breast: Secondary | ICD-10-CM | POA: Diagnosis not present

## 2014-08-17 ENCOUNTER — Ambulatory Visit
Admission: RE | Admit: 2014-08-17 | Discharge: 2014-08-17 | Disposition: A | Discharge status: Home / Self Care | Payer: BLUE CROSS/BLUE SHIELD | Source: Ambulatory Visit | Attending: Radiation Oncology | Admitting: Radiation Oncology

## 2014-08-17 DIAGNOSIS — C50511 Malignant neoplasm of lower-outer quadrant of right female breast: Secondary | ICD-10-CM

## 2014-08-18 ENCOUNTER — Encounter: Payer: Self-pay | Admitting: Radiation Oncology

## 2014-08-18 DIAGNOSIS — C50511 Malignant neoplasm of lower-outer quadrant of right female breast: Secondary | ICD-10-CM | POA: Diagnosis not present

## 2014-08-18 NOTE — Progress Notes (Signed)
Simulation Verification Note   ICD-9-CM ICD-10-CM   1. Breast cancer of lower-outer quadrant of right female breast 174.5 C50.511    Outpatient  The patient was brought to the treatment unit and placed in the planned treatment position. The clinical setup was verified. Then port films were obtained and uploaded to the radiation oncology medical record software.  The treatment beams were carefully compared against the planned radiation fields. The position location and shape of the radiation fields was reviewed. They targeted volume of tissue appears to be appropriately covered by the radiation beams. Organs at risk appear to be excluded as planned.  Based on my personal review, I approved the simulation verification. The patient's treatment will proceed as planned.  -----------------------------------  Eppie Gibson, MD

## 2014-08-19 ENCOUNTER — Telehealth: Payer: Self-pay | Admitting: Oncology

## 2014-08-19 NOTE — Telephone Encounter (Signed)
s.w. pt and advised on appt changed from 3.16 to 3.18 per [facility]....pt ok and aware

## 2014-08-21 ENCOUNTER — Ambulatory Visit: Payer: BLUE CROSS/BLUE SHIELD

## 2014-08-22 ENCOUNTER — Ambulatory Visit
Admission: RE | Admit: 2014-08-22 | Discharge: 2014-08-22 | Disposition: A | Discharge status: Home / Self Care | Payer: BLUE CROSS/BLUE SHIELD | Source: Ambulatory Visit | Attending: Radiation Oncology | Admitting: Radiation Oncology

## 2014-08-22 ENCOUNTER — Ambulatory Visit: Payer: BLUE CROSS/BLUE SHIELD

## 2014-08-22 DIAGNOSIS — C50511 Malignant neoplasm of lower-outer quadrant of right female breast: Secondary | ICD-10-CM | POA: Diagnosis not present

## 2014-08-23 ENCOUNTER — Ambulatory Visit
Admission: RE | Admit: 2014-08-23 | Discharge: 2014-08-23 | Disposition: A | Discharge status: Home / Self Care | Payer: BLUE CROSS/BLUE SHIELD | Source: Ambulatory Visit | Attending: Radiation Oncology | Admitting: Radiation Oncology

## 2014-08-23 DIAGNOSIS — C50511 Malignant neoplasm of lower-outer quadrant of right female breast: Secondary | ICD-10-CM | POA: Diagnosis not present

## 2014-08-24 ENCOUNTER — Ambulatory Visit
Admission: RE | Admit: 2014-08-24 | Discharge: 2014-08-24 | Disposition: A | Discharge status: Home / Self Care | Payer: BLUE CROSS/BLUE SHIELD | Source: Ambulatory Visit | Attending: Radiation Oncology | Admitting: Radiation Oncology

## 2014-08-24 DIAGNOSIS — C50511 Malignant neoplasm of lower-outer quadrant of right female breast: Secondary | ICD-10-CM | POA: Diagnosis not present

## 2014-08-25 ENCOUNTER — Ambulatory Visit
Admission: RE | Admit: 2014-08-25 | Discharge: 2014-08-25 | Disposition: A | Discharge status: Home / Self Care | Payer: BLUE CROSS/BLUE SHIELD | Source: Ambulatory Visit | Attending: Radiation Oncology | Admitting: Radiation Oncology

## 2014-08-25 DIAGNOSIS — C50511 Malignant neoplasm of lower-outer quadrant of right female breast: Secondary | ICD-10-CM | POA: Diagnosis not present

## 2014-08-28 ENCOUNTER — Ambulatory Visit
Admission: RE | Admit: 2014-08-28 | Discharge: 2014-08-28 | Disposition: A | Discharge status: Home / Self Care | Payer: BC Managed Care – PPO | Source: Ambulatory Visit | Attending: Radiation Oncology | Admitting: Radiation Oncology

## 2014-08-28 ENCOUNTER — Encounter: Payer: Self-pay | Admitting: Radiation Oncology

## 2014-08-28 ENCOUNTER — Ambulatory Visit
Admission: RE | Admit: 2014-08-28 | Discharge: 2014-08-28 | Disposition: A | Discharge status: Home / Self Care | Payer: BLUE CROSS/BLUE SHIELD | Source: Ambulatory Visit | Attending: Radiation Oncology | Admitting: Radiation Oncology

## 2014-08-28 VITALS — BP 147/85 | HR 98 | Temp 98.7°F | Resp 10 | Wt 248.2 lb

## 2014-08-28 DIAGNOSIS — C50511 Malignant neoplasm of lower-outer quadrant of right female breast: Secondary | ICD-10-CM | POA: Diagnosis present

## 2014-08-28 DIAGNOSIS — R200 Anesthesia of skin: Secondary | ICD-10-CM | POA: Insufficient documentation

## 2014-08-28 MED ORDER — RADIAPLEXRX EX GEL
Freq: Once | CUTANEOUS | Status: AC
  Administered 2014-08-28: 11:00:00 via TOPICAL

## 2014-08-28 MED ORDER — ALRA NON-METALLIC DEODORANT (RAD-ONC)
1.0000 "application " | Freq: Once | TOPICAL | Status: AC
  Administered 2014-08-28: 1 via TOPICAL

## 2014-08-28 NOTE — Addendum Note (Signed)
Encounter addended by: Jenene Slicker, RN on: 08/28/2014  2:49 PM<BR>     Documentation filed: Demographics Visit

## 2014-08-28 NOTE — Progress Notes (Signed)
SKIN CARE DURING RADIATION TREATMENT-BREAST  RECOMMENDATIONS: ? Use unscented soap (Dove) ? When showering it is fine for water to touch the area, but please avoid direct spray on the treatment field.  Also, wash inside and around the marked area ? When drying gently blot the area ? Avoid using lotions, oils or powders as well as products with alcohol DEODORANT: ? [employer] [place] ([place], [place]) ? [place] [employer] multimedia- 100% natural mineral salts and purified water ([employer], [facility]) ? Naturally Fresh Crystal Deodorant ([employer], [facility]) ? We also have deodorant available at nursing ? A combination of equal amounts of baking soda and corn starch.  Mix together and powder puff on OPTIONAL: ? Moisturizer o You may be given Radiaplex Gel (provided by nursing) to use. Apply twice daily, once after treatment and then again prior to bedtime o Your Radiation Oncologist may suggest other skin care products.  ? PLEASE DO NOT APPLY ANYTHING TO THE TREATMENT AREA SKIN WITH 4 HOURS  PRIOR TO RADIATION

## 2014-08-28 NOTE — Addendum Note (Signed)
Encounter addended by: Jenene Slicker, RN on: 08/28/2014 11:26 AM<BR>     Documentation filed: Notes Section

## 2014-08-28 NOTE — Addendum Note (Signed)
Encounter addended by: Jenene Slicker, RN on: 08/28/2014 11:21 AM<BR>     Documentation filed: Dx [employer], Inpatient MAR, Orders

## 2014-08-28 NOTE — Progress Notes (Signed)
   Weekly Management Note:  outpatient    ICD-9-CM ICD-10-CM   1. Breast cancer of lower-outer quadrant of right female breast 174.5 C50.511     Current Dose:  9 Gy  Projected Dose: 60.4 Gy   Narrative:  The patient presents for routine under treatment assessment.  CBCT/MVCT images/Port film x-rays were reviewed.  The chart was checked. Minimal skin hyperpigmentation  Physical Findings:  weight is 248 lb 3.2 oz (112.583 kg). Her oral temperature is 98.7 F (37.1 C). Her blood pressure is 147/85 and her pulse is 98. Her respiration is 10 and oxygen saturation is 100%.  right breast minimal hypierpigmentation  Impression:  The patient is tolerating radiotherapy.  Plan:  Continue radiotherapy as planned.    ________________________________   Eppie Gibson, M.D.

## 2014-08-28 NOTE — Progress Notes (Signed)
She is currently in no pain. Pt complains of Fatigue.  Pt right breast warm dry and intact, no edema.  Reports numbness and tingling in fingers and toes.

## 2014-08-28 NOTE — Progress Notes (Signed)
Pt here for patient teaching.  Radiation and You booklet given with areas of pertinence flagged and highlighted.  Reviewed fatigue, hair loss to treatment area, skin changes/care. Radiaplex cream and Alra deodorant given with instruction. Pt able to give teach back of how frequently to apply cream, not to apply cream within 4 hrs of treatment, to avoid shaving axilla and to pat skin. Pt demonstrated understanding of information given and will contact nursing with any questions or concerns.

## 2014-08-29 ENCOUNTER — Ambulatory Visit
Admission: RE | Admit: 2014-08-29 | Discharge: 2014-08-29 | Disposition: A | Discharge status: Home / Self Care | Payer: BLUE CROSS/BLUE SHIELD | Source: Ambulatory Visit | Attending: Radiation Oncology | Admitting: Radiation Oncology

## 2014-08-29 DIAGNOSIS — C50511 Malignant neoplasm of lower-outer quadrant of right female breast: Secondary | ICD-10-CM | POA: Diagnosis not present

## 2014-08-30 ENCOUNTER — Ambulatory Visit
Admission: RE | Admit: 2014-08-30 | Discharge: 2014-08-30 | Disposition: A | Discharge status: Home / Self Care | Payer: BLUE CROSS/BLUE SHIELD | Source: Ambulatory Visit | Attending: Radiation Oncology | Admitting: Radiation Oncology

## 2014-08-30 DIAGNOSIS — C50511 Malignant neoplasm of lower-outer quadrant of right female breast: Secondary | ICD-10-CM | POA: Diagnosis not present

## 2014-08-31 ENCOUNTER — Ambulatory Visit
Admission: RE | Admit: 2014-08-31 | Discharge: 2014-08-31 | Disposition: A | Discharge status: Home / Self Care | Payer: BLUE CROSS/BLUE SHIELD | Source: Ambulatory Visit | Attending: Radiation Oncology | Admitting: Radiation Oncology

## 2014-08-31 DIAGNOSIS — C50511 Malignant neoplasm of lower-outer quadrant of right female breast: Secondary | ICD-10-CM | POA: Diagnosis not present

## 2014-09-04 ENCOUNTER — Ambulatory Visit
Admission: RE | Admit: 2014-09-04 | Discharge: 2014-09-04 | Disposition: A | Discharge status: Home / Self Care | Payer: BLUE CROSS/BLUE SHIELD | Source: Ambulatory Visit | Attending: Radiation Oncology | Admitting: Radiation Oncology

## 2014-09-04 ENCOUNTER — Ambulatory Visit
Admission: RE | Admit: 2014-09-04 | Discharge: 2014-09-04 | Disposition: A | Discharge status: Home / Self Care | Payer: BC Managed Care – PPO | Source: Ambulatory Visit | Attending: Radiation Oncology | Admitting: Radiation Oncology

## 2014-09-04 ENCOUNTER — Ambulatory Visit: Payer: BLUE CROSS/BLUE SHIELD

## 2014-09-04 DIAGNOSIS — C50511 Malignant neoplasm of lower-outer quadrant of right female breast: Secondary | ICD-10-CM

## 2014-09-04 NOTE — Progress Notes (Signed)
Weekly Management Note:  Site: Right breast Current Dose:  1800  cGy Projected Dose: 5040  cGy  Narrative: The patient is seen today for routine under treatment assessment. CBCT/MVCT images/port films were reviewed. The chart was reviewed.   She is without complaints today.  She uses Radioplex gel twice a day.  Physical Examination: There were no vitals filed for this visit..  Weight:  .  There are no significant skin changes along the right breast.  Impression: Tolerating radiation therapy well.  Plan: Continue radiation therapy as planned.

## 2014-09-04 NOTE — Progress Notes (Signed)
Weekly rad txs right breast 9/ completed, slight tanning,skinintact, radiaplex gel bid, no c/o pain, appetite good, no fatigue 1:16 PM

## 2014-09-05 ENCOUNTER — Ambulatory Visit
Admission: RE | Admit: 2014-09-05 | Discharge: 2014-09-05 | Disposition: A | Discharge status: Home / Self Care | Payer: BLUE CROSS/BLUE SHIELD | Source: Ambulatory Visit | Attending: Radiation Oncology | Admitting: Radiation Oncology

## 2014-09-05 DIAGNOSIS — C50511 Malignant neoplasm of lower-outer quadrant of right female breast: Secondary | ICD-10-CM | POA: Diagnosis not present

## 2014-09-06 ENCOUNTER — Ambulatory Visit
Admission: RE | Admit: 2014-09-06 | Discharge: 2014-09-06 | Disposition: A | Discharge status: Home / Self Care | Payer: BLUE CROSS/BLUE SHIELD | Source: Ambulatory Visit | Attending: Radiation Oncology | Admitting: Radiation Oncology

## 2014-09-06 DIAGNOSIS — C50511 Malignant neoplasm of lower-outer quadrant of right female breast: Secondary | ICD-10-CM | POA: Diagnosis not present

## 2014-09-07 ENCOUNTER — Ambulatory Visit
Admission: RE | Admit: 2014-09-07 | Discharge: 2014-09-07 | Disposition: A | Discharge status: Home / Self Care | Payer: BLUE CROSS/BLUE SHIELD | Source: Ambulatory Visit | Attending: Radiation Oncology | Admitting: Radiation Oncology

## 2014-09-07 DIAGNOSIS — C50511 Malignant neoplasm of lower-outer quadrant of right female breast: Secondary | ICD-10-CM | POA: Diagnosis not present

## 2014-09-08 DIAGNOSIS — C50511 Malignant neoplasm of lower-outer quadrant of right female breast: Secondary | ICD-10-CM | POA: Diagnosis present

## 2014-09-11 ENCOUNTER — Ambulatory Visit
Admission: RE | Admit: 2014-09-11 | Discharge: 2014-09-11 | Disposition: A | Discharge status: Home / Self Care | Payer: BLUE CROSS/BLUE SHIELD | Source: Ambulatory Visit | Attending: Radiation Oncology | Admitting: Radiation Oncology

## 2014-09-11 ENCOUNTER — Telehealth: Payer: Self-pay

## 2014-09-11 ENCOUNTER — Other Ambulatory Visit: Payer: Self-pay | Admitting: *Deleted

## 2014-09-11 ENCOUNTER — Encounter: Payer: Self-pay | Admitting: Radiation Oncology

## 2014-09-11 VITALS — BP 124/80 | HR 89 | Temp 98.1°F | Resp 20 | Wt 247.2 lb

## 2014-09-11 DIAGNOSIS — C50511 Malignant neoplasm of lower-outer quadrant of right female breast: Secondary | ICD-10-CM | POA: Diagnosis not present

## 2014-09-11 MED ORDER — GABAPENTIN 300 MG PO CAPS: 300.0000 mg | ORAL_CAPSULE | Freq: Three times a day (TID) | ORAL | Status: DC

## 2014-09-11 NOTE — Progress Notes (Signed)
   Weekly Management Note:  outpatient    ICD-9-CM ICD-10-CM   1. Breast cancer of lower-outer quadrant of right female breast 174.5 C50.511     Current Dose: 23.4 Gy  Projected Dose: 60.4 Gy   Narrative:  The patient presents for routine under treatment assessment.  CBCT/MVCT images/Port film x-rays were reviewed.  The chart was checked. Mild increase in skin hyperpigmentation  Physical Findings:  weight is 247 lb 3.2 oz (112.129 kg). Her oral temperature is 98.1 F (36.7 C). Her blood pressure is 124/80 and her pulse is 89. Her respiration is 20.  right breast minimal hyperpigmentation, skin intact  Impression:  The patient is tolerating radiotherapy.  Plan:  Continue radiotherapy as planned.    ________________________________   Eppie Gibson, M.D.

## 2014-09-11 NOTE — Progress Notes (Signed)
Patient denies pain, fatigue, loss of appetite. She states her right nipple is darkening slightly. She is applying radiaplex to right breast.

## 2014-09-11 NOTE — Telephone Encounter (Signed)
Message left for patient to pick up gabapentin script from [place] sent in my Dr.Magrinat's office.

## 2014-09-12 ENCOUNTER — Ambulatory Visit
Admission: RE | Admit: 2014-09-12 | Discharge: 2014-09-12 | Disposition: A | Discharge status: Home / Self Care | Payer: BLUE CROSS/BLUE SHIELD | Source: Ambulatory Visit | Attending: Radiation Oncology | Admitting: Radiation Oncology

## 2014-09-12 DIAGNOSIS — C50511 Malignant neoplasm of lower-outer quadrant of right female breast: Secondary | ICD-10-CM | POA: Diagnosis not present

## 2014-09-13 ENCOUNTER — Ambulatory Visit
Admission: RE | Admit: 2014-09-13 | Discharge: 2014-09-13 | Disposition: A | Discharge status: Home / Self Care | Payer: BLUE CROSS/BLUE SHIELD | Source: Ambulatory Visit | Attending: Radiation Oncology | Admitting: Radiation Oncology

## 2014-09-13 DIAGNOSIS — C50511 Malignant neoplasm of lower-outer quadrant of right female breast: Secondary | ICD-10-CM | POA: Diagnosis not present

## 2014-09-14 ENCOUNTER — Ambulatory Visit
Admission: RE | Admit: 2014-09-14 | Discharge: 2014-09-14 | Disposition: A | Discharge status: Home / Self Care | Payer: BLUE CROSS/BLUE SHIELD | Source: Ambulatory Visit | Attending: Radiation Oncology | Admitting: Radiation Oncology

## 2014-09-14 DIAGNOSIS — C50511 Malignant neoplasm of lower-outer quadrant of right female breast: Secondary | ICD-10-CM | POA: Diagnosis not present

## 2014-09-15 ENCOUNTER — Ambulatory Visit
Admission: RE | Admit: 2014-09-15 | Discharge: 2014-09-15 | Disposition: A | Discharge status: Home / Self Care | Payer: BLUE CROSS/BLUE SHIELD | Source: Ambulatory Visit | Attending: Radiation Oncology | Admitting: Radiation Oncology

## 2014-09-15 ENCOUNTER — Encounter: Payer: Self-pay | Admitting: Radiation Oncology

## 2014-09-15 DIAGNOSIS — C50511 Malignant neoplasm of lower-outer quadrant of right female breast: Secondary | ICD-10-CM | POA: Diagnosis not present

## 2014-09-18 ENCOUNTER — Ambulatory Visit
Admission: RE | Admit: 2014-09-18 | Discharge: 2014-09-18 | Disposition: A | Discharge status: Home / Self Care | Payer: BLUE CROSS/BLUE SHIELD | Source: Ambulatory Visit | Attending: Radiation Oncology | Admitting: Radiation Oncology

## 2014-09-18 VITALS — BP 140/83 | HR 98 | Temp 98.0°F | Wt 249.0 lb

## 2014-09-18 DIAGNOSIS — C50511 Malignant neoplasm of lower-outer quadrant of right female breast: Secondary | ICD-10-CM

## 2014-09-18 NOTE — Progress Notes (Signed)
Weekly assessment of radiation to right breast.Completed 18 of 28 fractions.Skin with marked hyperpigmentation without peeling.Denies pain or fatigue.Continue application of radiaplex twice daily.

## 2014-09-18 NOTE — Progress Notes (Signed)
   Weekly Management Note:  Outpatient    ICD-9-CM ICD-10-CM   1. Breast cancer of lower-outer quadrant of right female breast 174.5 C50.511     Current Dose:  32.4 Gy  Projected Dose: 60.4 Gy   Narrative:  The patient presents for routine under treatment assessment.  CBCT/MVCT images/Port film x-rays were reviewed.  The chart was checked. Weekly assessment of radiation to right breast.Completed 18 fractions.Skin with marked hyperpigmentation without peeling.Denies pain or fatigue.Continue application of radiaplex twice daily.  Physical Findings:  weight is 249 lb (112.946 kg). Her temperature is 98 F (36.7 C). Her blood pressure is 140/83 and her pulse is 98.  skin intact, darkened over right breast  Impression:  The patient is tolerating radiotherapy.  Plan:  Continue radiotherapy as planned.    ________________________________   Eppie Gibson, M.D.

## 2014-09-19 ENCOUNTER — Ambulatory Visit
Admission: RE | Admit: 2014-09-19 | Discharge: 2014-09-19 | Disposition: A | Discharge status: Home / Self Care | Payer: BLUE CROSS/BLUE SHIELD | Source: Ambulatory Visit | Attending: Radiation Oncology | Admitting: Radiation Oncology

## 2014-09-19 DIAGNOSIS — C50511 Malignant neoplasm of lower-outer quadrant of right female breast: Secondary | ICD-10-CM | POA: Diagnosis not present

## 2014-09-20 ENCOUNTER — Ambulatory Visit
Admission: RE | Admit: 2014-09-20 | Discharge: 2014-09-20 | Disposition: A | Discharge status: Home / Self Care | Payer: BLUE CROSS/BLUE SHIELD | Source: Ambulatory Visit | Attending: Radiation Oncology | Admitting: Radiation Oncology

## 2014-09-20 DIAGNOSIS — C50511 Malignant neoplasm of lower-outer quadrant of right female breast: Secondary | ICD-10-CM | POA: Diagnosis not present

## 2014-09-21 ENCOUNTER — Encounter (HOSPITAL_BASED_OUTPATIENT_CLINIC_OR_DEPARTMENT_OTHER): Payer: Self-pay | Admitting: General Surgery

## 2014-09-21 ENCOUNTER — Ambulatory Visit
Admission: RE | Admit: 2014-09-21 | Discharge: 2014-09-21 | Disposition: A | Discharge status: Home / Self Care | Payer: BLUE CROSS/BLUE SHIELD | Source: Ambulatory Visit | Attending: Radiation Oncology | Admitting: Radiation Oncology

## 2014-09-21 DIAGNOSIS — C50511 Malignant neoplasm of lower-outer quadrant of right female breast: Secondary | ICD-10-CM | POA: Diagnosis not present

## 2014-09-22 ENCOUNTER — Ambulatory Visit
Admission: RE | Admit: 2014-09-22 | Discharge: 2014-09-22 | Disposition: A | Discharge status: Home / Self Care | Payer: BLUE CROSS/BLUE SHIELD | Source: Ambulatory Visit | Attending: Radiation Oncology | Admitting: Radiation Oncology

## 2014-09-22 DIAGNOSIS — C50511 Malignant neoplasm of lower-outer quadrant of right female breast: Secondary | ICD-10-CM | POA: Diagnosis not present

## 2014-09-25 ENCOUNTER — Ambulatory Visit
Admission: RE | Admit: 2014-09-25 | Discharge: 2014-09-25 | Disposition: A | Discharge status: Home / Self Care | Payer: BLUE CROSS/BLUE SHIELD | Source: Ambulatory Visit | Attending: Radiation Oncology | Admitting: Radiation Oncology

## 2014-09-25 ENCOUNTER — Encounter: Payer: Self-pay | Admitting: Radiation Oncology

## 2014-09-25 VITALS — BP 139/88 | HR 105 | Temp 98.7°F | Resp 20 | Wt 253.2 lb

## 2014-09-25 DIAGNOSIS — C50511 Malignant neoplasm of lower-outer quadrant of right female breast: Secondary | ICD-10-CM

## 2014-09-25 NOTE — Progress Notes (Signed)
   Weekly Management Note:  Outpatient    ICD-9-CM ICD-10-CM   1. Breast cancer of lower-outer quadrant of right female breast 174.5 C50.511     Current Dose: 41.4 Gy  Projected Dose: 60.4 Gy   Narrative:  The patient presents for routine under treatment assessment.  CBCT/MVCT images/Port film x-rays were reviewed.  The chart was checked.Patient denies pain but states her right nipple is "sore (2-3/10 severity)". She is applying Radiaplex to right breast for darkening of skin, no desquamation. She denies fatigue, loss of appetite.  Physical Findings:  weight is 253 lb 3.2 oz (114.851 kg). Her oral temperature is 98.7 F (37.1 C). Her blood pressure is 139/88 and her pulse is 105. Her respiration is 20.  skin intact, darkened over right breast; right nipple- dry skin.  Impression:  The patient is tolerating radiotherapy.  Plan:  Continue radiotherapy as planned.    ________________________________   Eppie Gibson, M.D.

## 2014-09-25 NOTE — Progress Notes (Signed)
Patient denies pain but states her right nipple is "sore". She is applying Radiaplex to right breast for darkening of skin, no desquamation. She denies fatigue, loss of appetite.

## 2014-09-26 ENCOUNTER — Ambulatory Visit
Admission: RE | Admit: 2014-09-26 | Discharge: 2014-09-26 | Disposition: A | Discharge status: Home / Self Care | Payer: BLUE CROSS/BLUE SHIELD | Source: Ambulatory Visit | Attending: Radiation Oncology | Admitting: Radiation Oncology

## 2014-09-26 ENCOUNTER — Telehealth: Payer: Self-pay | Admitting: *Deleted

## 2014-09-26 DIAGNOSIS — C50511 Malignant neoplasm of lower-outer quadrant of right female breast: Secondary | ICD-10-CM | POA: Diagnosis not present

## 2014-09-26 NOTE — Telephone Encounter (Signed)
Per Dr Isidore Moos, called patient and informed her she may take either Aleve or Ibuprofen for her pain. Also informed her Dr Isidore Moos would like to see her following treatment tomorrow to evaluate. [name] states when she got to work after treatment this morning she applied Radiaplex and took Aleve 2 tabs. She states her pain "is gone". She states she will continue with Aleve and doesn't feel she needs to be seen tomorrow. Informed her this RN will let Dr Isidore Moos know, and reminded her that she may be seen at any time. Patient verbalized appreciation and understanding.

## 2014-09-27 ENCOUNTER — Ambulatory Visit
Admission: RE | Admit: 2014-09-27 | Discharge: 2014-09-27 | Disposition: A | Discharge status: Home / Self Care | Payer: BLUE CROSS/BLUE SHIELD | Source: Ambulatory Visit | Attending: Radiation Oncology | Admitting: Radiation Oncology

## 2014-09-27 ENCOUNTER — Telehealth: Payer: Self-pay | Admitting: *Deleted

## 2014-09-27 DIAGNOSIS — C50511 Malignant neoplasm of lower-outer quadrant of right female breast: Secondary | ICD-10-CM | POA: Diagnosis not present

## 2014-09-27 NOTE — Telephone Encounter (Signed)
CALLED PATIENT TO ASK ABOUT COMING IN EARLY ON 10-02-14 TO SEE DR. [place]., LVM FOR A RETURN CALL

## 2014-09-27 NOTE — Progress Notes (Signed)
Spoke with patient prior to tx today. She states she continues to take Aleve with good pain relief. Informed her this RN will let Dr Isidore Moos know.

## 2014-09-28 ENCOUNTER — Ambulatory Visit
Admission: RE | Admit: 2014-09-28 | Discharge: 2014-09-28 | Disposition: A | Discharge status: Home / Self Care | Payer: BLUE CROSS/BLUE SHIELD | Source: Ambulatory Visit | Attending: Radiation Oncology | Admitting: Radiation Oncology

## 2014-09-28 DIAGNOSIS — C50511 Malignant neoplasm of lower-outer quadrant of right female breast: Secondary | ICD-10-CM | POA: Diagnosis not present

## 2014-09-29 ENCOUNTER — Ambulatory Visit: Payer: BLUE CROSS/BLUE SHIELD

## 2014-10-02 ENCOUNTER — Ambulatory Visit: Payer: BLUE CROSS/BLUE SHIELD

## 2014-10-02 ENCOUNTER — Ambulatory Visit
Admission: RE | Admit: 2014-10-02 | Payer: BLUE CROSS/BLUE SHIELD | Source: Ambulatory Visit | Admitting: Radiation Oncology

## 2014-10-03 ENCOUNTER — Ambulatory Visit: Payer: BLUE CROSS/BLUE SHIELD

## 2014-10-03 ENCOUNTER — Ambulatory Visit
Admission: RE | Admit: 2014-10-03 | Discharge: 2014-10-03 | Disposition: A | Discharge status: Home / Self Care | Payer: BLUE CROSS/BLUE SHIELD | Source: Ambulatory Visit | Attending: Radiation Oncology | Admitting: Radiation Oncology

## 2014-10-03 DIAGNOSIS — C50511 Malignant neoplasm of lower-outer quadrant of right female breast: Secondary | ICD-10-CM | POA: Diagnosis not present

## 2014-10-04 ENCOUNTER — Ambulatory Visit: Payer: BLUE CROSS/BLUE SHIELD

## 2014-10-04 ENCOUNTER — Encounter: Payer: Self-pay | Admitting: Radiation Oncology

## 2014-10-04 ENCOUNTER — Ambulatory Visit
Admission: RE | Admit: 2014-10-04 | Discharge: 2014-10-04 | Disposition: A | Discharge status: Home / Self Care | Payer: BLUE CROSS/BLUE SHIELD | Source: Ambulatory Visit | Attending: Radiation Oncology | Admitting: Radiation Oncology

## 2014-10-04 DIAGNOSIS — C50511 Malignant neoplasm of lower-outer quadrant of right female breast: Secondary | ICD-10-CM | POA: Diagnosis present

## 2014-10-04 DIAGNOSIS — L599 Disorder of the skin and subcutaneous tissue related to radiation, unspecified: Secondary | ICD-10-CM | POA: Diagnosis not present

## 2014-10-04 MED ORDER — SILVER SULFADIAZINE 1 % EX CREA
TOPICAL_CREAM | Freq: Two times a day (BID) | CUTANEOUS | Status: DC
  Administered 2014-10-04: 11:00:00 via TOPICAL

## 2014-10-04 MED ORDER — BIAFINE EX EMUL
Freq: Two times a day (BID) | CUTANEOUS | Status: DC
  Administered 2014-10-04: 11:00:00 via TOPICAL

## 2014-10-04 NOTE — Progress Notes (Signed)
   Weekly Management Note:  Outpatient    ICD-9-CM ICD-10-CM   1. Breast cancer of lower-outer quadrant of right female breast 174.5 C50.511 topical emolient (BIAFINE) emulsion     silver sulfADIAZINE (SILVADENE) 1 % cream    Current Dose: 50.4Gy  Projected Dose: 60.4 Gy   Narrative:  The patient presents for routine under treatment assessment.  CBCT/MVCT images/Port film x-rays were reviewed.  The chart was checked. Increased skin irritation   Physical Findings:  weight is 253 lb 8 oz (114.987 kg). Her oral temperature is 98.6 F (37 C). Her blood pressure is 117/80 and her pulse is 93. Her respiration is 20.  skin  darkened over right breast; right nipple- flaking, dry skin.  Patch of moist desquamation at inframammary fold   Impression:  The patient is tolerating radiotherapy.  Plan:  Continue radiotherapy as planned.   Biafine diffusely over breast for dry skin; silvadene in moist area.    ________________________________   Eppie Gibson, M.D.

## 2014-10-04 NOTE — Progress Notes (Signed)
Patient reports skin irritation under breast and some itching in nipple area. Moist desquamation under right breast, dry desquamation over nipple and darkening of entire breast treatment area. Gave her Biafine for itching, advised she may apply antibiotic ointment underneath right breast and then Biafine over that as well as over remaining treatment area. Patient has mild pain under breast but states "it is better than yesterday". She is taking Aleve daily. She denies fatigue, loss of appetite.

## 2014-10-05 ENCOUNTER — Ambulatory Visit
Admission: RE | Admit: 2014-10-05 | Discharge: 2014-10-05 | Disposition: A | Discharge status: Home / Self Care | Payer: BLUE CROSS/BLUE SHIELD | Source: Ambulatory Visit | Attending: Radiation Oncology | Admitting: Radiation Oncology

## 2014-10-05 ENCOUNTER — Ambulatory Visit: Payer: BLUE CROSS/BLUE SHIELD

## 2014-10-05 DIAGNOSIS — C50511 Malignant neoplasm of lower-outer quadrant of right female breast: Secondary | ICD-10-CM | POA: Diagnosis not present

## 2014-10-06 ENCOUNTER — Ambulatory Visit: Payer: BLUE CROSS/BLUE SHIELD

## 2014-10-06 ENCOUNTER — Ambulatory Visit
Admission: RE | Admit: 2014-10-06 | Discharge: 2014-10-06 | Disposition: A | Discharge status: Home / Self Care | Payer: BLUE CROSS/BLUE SHIELD | Source: Ambulatory Visit | Attending: Radiation Oncology | Admitting: Radiation Oncology

## 2014-10-06 DIAGNOSIS — C50511 Malignant neoplasm of lower-outer quadrant of right female breast: Secondary | ICD-10-CM | POA: Diagnosis not present

## 2014-10-09 ENCOUNTER — Ambulatory Visit: Payer: BLUE CROSS/BLUE SHIELD

## 2014-10-09 ENCOUNTER — Ambulatory Visit
Admission: RE | Admit: 2014-10-09 | Discharge: 2014-10-09 | Disposition: A | Discharge status: Home / Self Care | Payer: BLUE CROSS/BLUE SHIELD | Source: Ambulatory Visit | Attending: Radiation Oncology | Admitting: Radiation Oncology

## 2014-10-09 DIAGNOSIS — C50511 Malignant neoplasm of lower-outer quadrant of right female breast: Secondary | ICD-10-CM | POA: Diagnosis not present

## 2014-10-10 ENCOUNTER — Ambulatory Visit
Admission: RE | Admit: 2014-10-10 | Discharge: 2014-10-10 | Disposition: A | Discharge status: Home / Self Care | Payer: BLUE CROSS/BLUE SHIELD | Source: Ambulatory Visit | Attending: Radiation Oncology | Admitting: Radiation Oncology

## 2014-10-10 ENCOUNTER — Ambulatory Visit: Payer: BLUE CROSS/BLUE SHIELD

## 2014-10-10 DIAGNOSIS — C50511 Malignant neoplasm of lower-outer quadrant of right female breast: Secondary | ICD-10-CM | POA: Diagnosis not present

## 2014-10-11 ENCOUNTER — Ambulatory Visit: Payer: BLUE CROSS/BLUE SHIELD

## 2014-10-11 ENCOUNTER — Encounter: Payer: Self-pay | Admitting: Radiation Oncology

## 2014-10-11 ENCOUNTER — Ambulatory Visit
Admission: RE | Admit: 2014-10-11 | Discharge: 2014-10-11 | Disposition: A | Discharge status: Home / Self Care | Payer: BLUE CROSS/BLUE SHIELD | Source: Ambulatory Visit | Attending: Radiation Oncology | Admitting: Radiation Oncology

## 2014-10-11 VITALS — BP 149/83 | HR 91 | Temp 98.1°F | Resp 12 | Wt 249.9 lb

## 2014-10-11 DIAGNOSIS — C50511 Malignant neoplasm of lower-outer quadrant of right female breast: Secondary | ICD-10-CM | POA: Diagnosis not present

## 2014-10-11 NOTE — Progress Notes (Signed)
   Weekly Management Note:  Outpatient    ICD-9-CM ICD-10-CM   1. Breast cancer of lower-outer quadrant of right female breast 174.5 C50.511     Current Dose: 60.4Gy  Projected Dose: 60.4 Gy   Narrative:  The patient presents for routine under treatment assessment.  CBCT/MVCT images/Port film x-rays were reviewed.  The chart was checked. Skin better.  She is currently in no pain.  Pt right breast- Dryness, Hyperpigmentation, Pruritus, breast tenderness and dry desquamation.  Pt denies edema. Pt continues to apply Biafine and Silvadene as directed.    Physical Findings:  weight is 249 lb 14.4 oz (113.354 kg). Her oral temperature is 98.1 F (36.7 C). Her blood pressure is 149/83 and her pulse is 91. Her respiration is 12 and oxygen saturation is 100%.  skin  darkened over right breast; right nipple- flaking, dry skin.  Patch of moist desquamation at inframammary fold has almost totally resolved   Impression:  The patient has tolerated radiotherapy.  Plan: f/u in 44mo ________________________________   Eppie Gibson, M.D.

## 2014-10-11 NOTE — Progress Notes (Signed)
She is currently in no pain.  Pt right breast- Dryness, Hyperpigmentation, Pruritus, breast tenderness and dry desquamation.  Pt denies edema. Pt continues to apply Biafine and Silvadene as directed. BP 149/83 mmHg  Pulse 91  Temp(Src) 98.1 F (36.7 C) (Oral)  Resp 12  Wt 249 lb 14.4 oz (113.354 kg)  SpO2 100%

## 2014-10-12 ENCOUNTER — Ambulatory Visit: Payer: BLUE CROSS/BLUE SHIELD

## 2014-10-16 NOTE — Progress Notes (Signed)
  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  Plan: The patient has completed radiation treatment. The patient will return to radiation oncology clinic for routine followup in one month. I advised them to call or return sooner if they have any questions or concerns related to their recovery or treatment.  -----------------------------------  Eppie Gibson, MD

## 2014-10-20 ENCOUNTER — Telehealth: Payer: Self-pay | Admitting: Oncology

## 2014-10-20 NOTE — Telephone Encounter (Signed)
, °

## 2014-11-01 NOTE — Progress Notes (Addendum)
Photon Boost Complex Emergency planning/management officer Note  Date of Service 09-25-14  Diagnosis: R Breast Cancer Outpatient  The patient's CT images from her simulation were reviewed to plan her boost treatment to her right breast  lumpectomy cavity.  The boost to the lumpectomy cavity will be delivered with 2 photon fields using MLCs for custom blocks again heart and lungs, with 6 and 10 MV photon energy.  This constitutes 2 complex treatment devices. Isodose plan was reviewed and approved. 10 Gy in 5 fractions prescribed.  -----------------------------------  Eppie Gibson, MD

## 2014-11-02 ENCOUNTER — Encounter: Payer: Self-pay | Admitting: *Deleted

## 2014-11-10 ENCOUNTER — Ambulatory Visit
Admission: RE | Admit: 2014-11-10 | Discharge: 2014-11-10 | Disposition: A | Discharge status: Home / Self Care | Payer: BLUE CROSS/BLUE SHIELD | Source: Ambulatory Visit | Attending: Radiation Oncology | Admitting: Radiation Oncology

## 2014-11-10 ENCOUNTER — Encounter: Payer: Self-pay | Admitting: Radiation Oncology

## 2014-11-10 VITALS — BP 156/77 | HR 88 | Temp 98.2°F | Resp 20 | Wt 256.0 lb

## 2014-11-10 DIAGNOSIS — C50511 Malignant neoplasm of lower-outer quadrant of right female breast: Secondary | ICD-10-CM

## 2014-11-10 NOTE — Progress Notes (Signed)
Patient denies pain, loss of appetite, states fatigue has resolved for the most part. She is no longer applying Silvadene or Biafine to right breast. She has some darkening of skin of right breast remaining, no desquamation.

## 2014-11-10 NOTE — Progress Notes (Signed)
  Radiation Oncology         [phone] ________________________________  Name: [name] MRN: [id number]  Date: 11/10/2014  DOB: Oct 27, 1955  Follow-Up Visit Note  Outpatient  CC: Chesley Noon, MD  Magrinat, Virgie Dad, MD  Diagnosis and Prior Radiotherapy: No diagnosis found.   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  Patient denies pain, loss of appetite, states fatigue has resolved for the most part. She is no longer applying Silvadene or Biafine to right breast but using baby oil. She has some darkening of skin of right breast remaining, no desquamation.                        ALLERGIES:  has No Known Allergies.  Meds: Current Outpatient Prescriptions  Medication Sig Dispense Refill  . amLODipine (NORVASC) 10 MG tablet TAKE ONE TABLET BY MOUTH ONCE DAILY    . Atorvastatin Calcium (LIPITOR PO) Take 40 mg by mouth daily.     Marland Kitchen dexamethasone (DECADRON) 0.1 % ophthalmic solution Place 1 drop into both eyes daily.    Marland Kitchen gabapentin (NEURONTIN) 300 MG capsule Take 1 capsule (300 mg total) by mouth 3 (three) times daily. 90 capsule 6  . glucose monitoring kit (FREESTYLE) monitoring kit One Touch Ultra machine and strips, patient tests her blood sugars bid    . glyBURIDE (DIABETA) 5 MG tablet Take 5 mg by mouth daily with breakfast.     . lidocaine-prilocaine (EMLA) cream Apply topically.    Marland Kitchen lisinopril-hydrochlorothiazide (PRINZIDE,ZESTORETIC) 20-12.5 MG per tablet Take 2 tablets by mouth daily.     Marland Kitchen LORazepam (ATIVAN) 0.5 MG tablet Take 1 tablet (0.5 mg total) by mouth at bedtime as needed (Nausea or vomiting). 30 tablet 0  . metFORMIN (GLUCOPHAGE-XR) 500 MG 24 hr tablet Take 500 mg by mouth 2  (two) times daily.     . ondansetron (ZOFRAN) 8 MG tablet Take 1 tablet (8 mg total) by mouth every 8 (eight) hours as needed for nausea or vomiting. 60 tablet 2  . ONE TOUCH ULTRA TEST test strip     . tiZANidine (ZANAFLEX) 4 MG tablet 4 mg at bedtime.      No current facility-administered medications for this encounter.    Physical Findings: The patient is in no acute distress. Patient is alert and oriented.  weight is 256 lb (116.121 kg). Her temperature is 98.2 F (36.8 C). Her blood pressure is 156/77 and her pulse is 88. Her respiration is 20. .   Modest residual hyperpigmentation over right breast. Skin is healing nicely   Lab Findings: Lab Results  Component Value Date   WBC 4.3 07/26/2014   HGB 7.6* 07/26/2014   HCT 23.2* 07/26/2014   MCV 88.5 07/26/2014   PLT 74* 07/26/2014    Radiographic Findings: No results found.  Impression/Plan:  I encouraged her to continue with yearly mammography and followup with medical oncology. I will see her back on an as-needed basis. I have encouraged her to call if she has any issues or concerns in the future. I wished her the very best.   _____________________________________   Eppie Gibson, MD

## 2014-11-15 ENCOUNTER — Other Ambulatory Visit (HOSPITAL_BASED_OUTPATIENT_CLINIC_OR_DEPARTMENT_OTHER): Payer: BLUE CROSS/BLUE SHIELD

## 2014-11-15 DIAGNOSIS — C50511 Malignant neoplasm of lower-outer quadrant of right female breast: Secondary | ICD-10-CM

## 2014-11-15 DIAGNOSIS — C50811 Malignant neoplasm of overlapping sites of right female breast: Secondary | ICD-10-CM

## 2014-11-15 DIAGNOSIS — D649 Anemia, unspecified: Secondary | ICD-10-CM

## 2014-11-15 LAB — COMPREHENSIVE METABOLIC PANEL (CC13)
ALT: 46 U/L (ref 0–55)
AST: 29 U/L (ref 5–34)
Albumin: 3.8 g/dL (ref 3.5–5.0)
Alkaline Phosphatase: 124 U/L (ref 40–150)
Anion Gap: 11 mEq/L (ref 3–11)
BUN: 22.4 mg/dL (ref 7.0–26.0)
CO2: 21 meq/L — AB (ref 22–29)
Calcium: 9.5 mg/dL (ref 8.4–10.4)
Chloride: 105 mEq/L (ref 98–109)
Creatinine: 1.2 mg/dL — ABNORMAL HIGH (ref 0.6–1.1)
EGFR: 58 mL/min/{1.73_m2} — ABNORMAL LOW (ref >90)
GLUCOSE: 156 mg/dL — AB (ref 70–140)
Potassium: 4.8 mEq/L (ref 3.5–5.1)
Sodium: 138 mEq/L (ref 136–145)
TOTAL PROTEIN: 7.6 g/dL (ref 6.4–8.3)
Total Bilirubin: 0.26 mg/dL (ref 0.20–1.20)

## 2014-11-15 LAB — CBC WITH DIFFERENTIAL/PLATELET
BASO%: 0.4 % (ref 0.0–2.0)
BASOS ABS: 0 10*3/uL (ref 0.0–0.1)
EOS ABS: 0.1 10*3/uL (ref 0.0–0.5)
EOS%: 2.8 % (ref 0.0–7.0)
HEMATOCRIT: 32.2 % — AB (ref 34.8–46.6)
HGB: 9.8 g/dL — ABNORMAL LOW (ref 11.6–15.9)
LYMPH#: 0.9 10*3/uL (ref 0.9–3.3)
LYMPH%: 18.9 % (ref 14.0–49.7)
MCH: 27.8 pg (ref 25.1–34.0)
MCHC: 30.3 g/dL — AB (ref 31.5–36.0)
MCV: 91.8 fL (ref 79.5–101.0)
MONO#: 0.5 10*3/uL (ref 0.1–0.9)
MONO%: 11.1 % (ref 0.0–14.0)
NEUT#: 3.2 10*3/uL (ref 1.5–6.5)
NEUT%: 66.8 % (ref 38.4–76.8)
PLATELETS: 249 10*3/uL (ref 145–400)
RBC: 3.51 10*6/uL — ABNORMAL LOW (ref 3.70–5.45)
RDW: 16.5 % — ABNORMAL HIGH (ref 11.2–14.5)
WBC: 4.8 10*3/uL (ref 3.9–10.3)

## 2014-11-22 ENCOUNTER — Ambulatory Visit: Payer: BC Managed Care – PPO | Admitting: Oncology

## 2014-11-24 ENCOUNTER — Telehealth: Payer: Self-pay | Admitting: Oncology

## 2014-11-24 ENCOUNTER — Telehealth: Payer: Self-pay | Admitting: Adult Health

## 2014-11-24 ENCOUNTER — Ambulatory Visit (HOSPITAL_BASED_OUTPATIENT_CLINIC_OR_DEPARTMENT_OTHER): Payer: BLUE CROSS/BLUE SHIELD | Admitting: Oncology

## 2014-11-24 VITALS — BP 158/78 | HR 89 | Temp 98.3°F | Resp 18 | Ht 65.0 in | Wt 257.4 lb

## 2014-11-24 DIAGNOSIS — C50811 Malignant neoplasm of overlapping sites of right female breast: Secondary | ICD-10-CM

## 2014-11-24 DIAGNOSIS — D649 Anemia, unspecified: Secondary | ICD-10-CM

## 2014-11-24 DIAGNOSIS — D630 Anemia in neoplastic disease: Secondary | ICD-10-CM

## 2014-11-24 DIAGNOSIS — E119 Type 2 diabetes mellitus without complications: Secondary | ICD-10-CM

## 2014-11-24 DIAGNOSIS — C50511 Malignant neoplasm of lower-outer quadrant of right female breast: Secondary | ICD-10-CM

## 2014-11-24 DIAGNOSIS — Z171 Estrogen receptor negative status [ER-]: Secondary | ICD-10-CM

## 2014-11-24 NOTE — Telephone Encounter (Signed)
Gave pt avs report and appt for sept. Patient also given mammo appointment for 4/6.

## 2014-11-24 NOTE — Telephone Encounter (Signed)
I left [name] a message to return my call to schedule her Survivorship Clinic visit with me, as requested by Bary Castilla, Breast Nurse Navigator.  I gave [name] my direct office number and encouraged her to return my call with available dates/times so that we could schedule her appointment.  I look forward to participating in her care.   Mike Craze, NP [facility] [phone]

## 2014-11-24 NOTE — Progress Notes (Signed)
[facility]  [phone] [phone]     ID: [name] OB: 23-Sep-1955  MR#: [id number]  HUT#:[id number]  PCP: Chesley Noon, MD GYN:   SU: Rolm Bookbinder OTHER MD: Eppie Gibson  CHIEF COMPLAINT: Triple negative breast cancer  CURRENT TREATMENT: observation  BREAST CANCER HISTORY: From the original intake note 01/18/2014:  The patient had routine bilateral screening mammography at the breast Center for 10/28/2013 showing a possible mass in the right breast. Additional views and right breast ultrasonography 12/20/2013 confirmed an irregular 1.3 cm right breast mass at the 6:00 position. This was not palpable. Ultrasound showed an irregular hypoechoic mass in the right breast measuring 1.4 cm. There was no right axillary adenopathy identified.  On 12/29/2013 the patient underwent right breast biopsy, showing ([id number]) and invasive ductal carcinoma, grade 3, estrogen and progesterone receptor negative, HER-2 not amplified with a signals ratio of 1.35 and a copy number per cell of 2.10, and an MIB-1 of 80%.  On 01/12/2014 the patient underwent bilateral breast MRI. This showed the mass in the right breast lower outer quadrant to measure 2.2 cm maximally. There was a 2 cm well circumscribed mass in the medial right breast which has been stable since 2011 him a and a 1 cm oval mass in the upper midline of the right breast, likely an intramammary lymph node, with benign features. A right axillary lymph node with cortical thickening measured 0.9 cm. The fatty hilum was possibly effaced; note Right axilla had been benign by [place] before biopsy. In the left breast there were no findings of concern.  The patient's subsequent history is as detailed below.  INTERVAL HISTORY: [name] returns today for follow up of her breast cancer. Since her last visit with me she completed her radiation treatments. She did "fine". She did have some peeling under her  breast and some fatigue, but she never stopped working and she maintained an exercise program chiefly walking.  REVIEW OF SYSTEMS: She still has some tingling and minimal numbness in her fingertips and toetips. There is no pain associated with this. She is able to do all her activities of daily living and she knows were her feet are as she walks, with no stumbling or gait imbalance. She tells me her sugars are "well-controlled". A detailed review of systems today was otherwise stable.   PAST MEDICAL HISTORY: Past Medical History  Diagnosis Date  . HTN (hypertension)   . Diabetes mellitus   . High cholesterol   . Family history of malignant neoplasm of breast   . Glaucoma   . Wears glasses   . Wears dentures     full top-partial bottom  . Breast cancer 12/29/13    right 6:00 o'clock, lower outer  . History of radiation therapy 08/12/14- 10/11/14    right breast /50.4 Gy/28 fx, right breast boost/ 10 Gy/ 5 fx    PAST SURGICAL HISTORY: Past Surgical History  Procedure Laterality Date  . Transphenoidal pituitary resection  2006  . Breast biopsy  2000    LEFT  . Tonsillectomy    . Portacath placement Left 02/08/2014    Procedure: INSERTION PORT-A-CATH;  Surgeon: Rolm Bookbinder, MD;  Location: [facility];  Service: General;  Laterality: Left;  . Breast lumpectomy with axillary lymph node biopsy Right 02/08/14    invasive ductal  . Abdominal hysterectomy  1993    endometriosis    FAMILY HISTORY Family History  Problem Relation Age of Onset  .  Hypertension Mother   . Diabetes type II Mother   . Breast cancer Mother     dx ~68; deceased 99  . Hypertension Father   . Breast cancer Maternal Aunt     deceased 2  . Cancer Maternal Uncle     unk. type; deceased 38s  . Cancer Maternal Uncle     unk. type; deceased late 59s  . Uterine cancer Cousin 39    daughter of an unaffected mat aunt who is 29   the patient's father is in his early 104s. The patient's mother died  at the age of 46, from complications of breast cancer which had been diagnosed "years before". The patient had 2 brothers, 2 sisters. The only other breast cancer in the family was a maternal aunt who died from breast cancer at the age of 59. (This means that 2 of 3 sisters, including the patient's mother, had breast cancer, one of them before the age of 64). There is no history of ovarian cancer in the family.  GYNECOLOGIC HISTORY:  Menarche age 67, first live birth age 51. The patient is GX P1. She underwent total abdominal hysterectomy with bilateral salpingo-oophorectomy at age 76. She took hormone replacement for one or 2 months old.  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 The patient is a widow. She lives by herself, with no pets. Her daughter [name] lives in [place], where she works in a Geneticist, molecular. The patient has no grandchildren. She is a Psychologist, forensic   ADVANCED DIRECTIVES: Not in place; advanced directives were discussed with the patient at her initial visit 01/18/2014. She intends to name her daughter as healthcare power of attorney. [name] can be reached at [phone]   HEALTH MAINTENANCE: History  Substance Use Topics  . Smoking status: Former Smoker    Types: Cigarettes    Quit date: 01/31/1994  . Smokeless tobacco: Not on file  . Alcohol Use: Yes     Comment: occ     Colonoscopy:  PAP: Status post hysterectomy  Bone density: Remote  Lipid panel:  No Known Allergies  Current Outpatient Prescriptions  Medication Sig Dispense Refill  . amLODipine (NORVASC) 10 MG tablet TAKE ONE TABLET BY MOUTH ONCE DAILY    . Atorvastatin Calcium (LIPITOR PO) Take 40 mg by mouth daily.     Marland Kitchen dexamethasone (DECADRON) 0.1 % ophthalmic solution Place 1 drop  into both eyes daily.    Marland Kitchen gabapentin (NEURONTIN) 300 MG capsule Take 1 capsule (300 mg total) by mouth 3 (three) times daily. 90 capsule 6  . glucose monitoring kit (FREESTYLE) monitoring kit One Touch Ultra machine and strips, patient tests her blood sugars bid    . glyBURIDE (DIABETA) 5 MG tablet Take 5 mg by mouth daily with breakfast.     . lidocaine-prilocaine (EMLA) cream Apply topically.    Marland Kitchen lisinopril-hydrochlorothiazide (PRINZIDE,ZESTORETIC) 20-12.5 MG per tablet Take 2 tablets by mouth daily.     Marland Kitchen LORazepam (ATIVAN) 0.5 MG tablet Take 1 tablet (0.5 mg total) by mouth at bedtime as needed (Nausea or vomiting). 30 tablet 0  . metFORMIN (GLUCOPHAGE-XR) 500 MG 24 hr tablet Take 500 mg by mouth 2 (two) times daily.     . ondansetron (ZOFRAN) 8 MG tablet Take 1 tablet (8  mg total) by mouth every 8 (eight) hours as needed for nausea or vomiting. 60 tablet 2  . ONE TOUCH ULTRA TEST test strip     . tiZANidine (ZANAFLEX) 4 MG tablet 4 mg at bedtime.      No current facility-administered medications for this visit.    OBJECTIVE:  Middle-aged African-American woman in no acute distress Filed Vitals:   11/24/14 1445  BP: 158/78  Pulse: 89  Temp: 98.3 F (36.8 C)  Resp: 18    ECOG FS:1 - Symptomatic but completely ambulatory   Sclerae unicteric, pupils equal and reactive Oropharynx clear and moist-- no thrush or other lesions No cervical or supraclavicular adenopathy Lungs no rales or rhonchi Heart regular rate and rhythm Abd soft, nontender, positive bowel sounds MSK no focal spinal tenderness, no upper extremity lymphedema Neuro: nonfocal, well oriented, appropriate affect Breasts: The right breast is status post lumpectomy and radiation. There is some hyperpigmentation, but no residual desquamation. There is no evidence of local recurrence. The right axilla is benign. Left breast is unremarkable.    LAB RESULTS:  CMP  I No results found for: SPEP  Lab Results    Component Value Date   WBC 4.8 11/15/2014   NEUTROABS 3.2 11/15/2014   HGB 9.8* 11/15/2014   HCT 32.2* 11/15/2014   MCV 91.8 11/15/2014   PLT 249 11/15/2014      Chemistry      Component Value Date/Time   NA 138 11/15/2014 1309   NA 141 02/03/2014 1400   K 4.8 11/15/2014 1309   K 4.4 02/03/2014 1400   CL 100 02/03/2014 1400   CO2 21* 11/15/2014 1309   CO2 24 02/03/2014 1400   BUN 22.4 11/15/2014 1309   BUN 20 02/03/2014 1400   CREATININE 1.2* 11/15/2014 1309   CREATININE 1.16* 02/03/2014 1400   GLU 353* 04/07/2014 1113      Component Value Date/Time   CALCIUM 9.5 11/15/2014 1309   CALCIUM 10.0 02/03/2014 1400   ALKPHOS 124 11/15/2014 1309   ALKPHOS 114 02/03/2014 1400   AST 29 11/15/2014 1309   AST 15 02/03/2014 1400   ALT 46 11/15/2014 1309   ALT 15 02/03/2014 1400   BILITOT 0.26 11/15/2014 1309   BILITOT 0.3 02/03/2014 1400       Lab Results  Component Value Date   LABCA2 51* 02/03/2014    No components found for: QMGNO037  No results for input(s): INR in the last 168 hours.  Urinalysis No results found for: COLORURINE  STUDIES: No results found.  ASSESSMENT: 59 y.o. BRCA negative [place] woman status post right breast biopsy 12/29/2013 for a clinical T2 N1, stage IIB invasive ductal carcinoma, grade 3, triple negative, with an MIB-1 of 80%.  (1) status post right lumpectomy and sentinel lymph node sampling 02/08/2014 for a pT2 pN0, stage IIA invasive ductal carcinoma, grade 3, repeat prognostic panel again triple negative  (2) adjuvant chemotherapy with doxorubicin and cyclophosphamide in dose dense fashion x4 completed 04/13/2014,  followed by carboplatin and Abraxane weekly x7 completed 06/30/2014  (a) additional 5 planned cycles cancelled because of neuropathy  (3) adjuvant radiation completed 10/11/2014  1) Right breast /50.4Gy in 28 Fractions  2) Right breast boost / 10 Gy in 5 fractions  (4) status post remote TAH BSO   (5) Status post  removal of a pituitary adenoma 09/05/2005, no malignancy identified  (6) moderately controlled hypertension  (7) Anemia - last requiring blood transfusion on 06/24/14  PLAN: [name]  has  completed her active treatment for breast cancer and what the head is observation for the next 5 years. She a ready has an appointment with Dr. Melford Aase in April and Dr. Donne Hazel in May. She will return to see [place] in August. We can then continue to "tag team her" with Dr. Donne Hazel every 3 months for the next year, after which we will each see her on a once a year basis until she completes her follow-up.  [name] has a good understanding of the overall plan. She agrees with it. She knows the goal of treatment in her case is cure. She will call with any problems that may develop before her next visit here.  Chauncey Cruel, MD  11/24/2014 2:47 PM

## 2014-11-27 ENCOUNTER — Telehealth: Payer: Self-pay | Admitting: Adult Health

## 2014-11-27 NOTE — Telephone Encounter (Signed)
I received a voicemail from [name] returning my call to schedule her Survivorship Clinic appointment.   I returned her call and spoke with her. She is now scheduled to come see me on Tuesday, 12/19/14 for her initial visit to the Survivorship Clinic.  She expressed her excitement and interest in pursuing the [employer] fitness program, as well as healthy eating/cooking options she could pursue as a cancer survivor.   I offered her reassurance that we would be able to help with her concerns and offer her some resources.  I look forward to participating in her care.   Mike Craze, NP [place] [phone]

## 2014-12-13 ENCOUNTER — Ambulatory Visit
Admission: RE | Admit: 2014-12-13 | Discharge: 2014-12-13 | Disposition: A | Discharge status: Home / Self Care | Payer: BLUE CROSS/BLUE SHIELD | Source: Ambulatory Visit | Attending: Oncology | Admitting: Oncology

## 2014-12-13 DIAGNOSIS — E119 Type 2 diabetes mellitus without complications: Secondary | ICD-10-CM

## 2014-12-13 DIAGNOSIS — C50511 Malignant neoplasm of lower-outer quadrant of right female breast: Secondary | ICD-10-CM

## 2014-12-13 DIAGNOSIS — D630 Anemia in neoplastic disease: Secondary | ICD-10-CM

## 2014-12-19 ENCOUNTER — Telehealth: Payer: Self-pay | Admitting: Adult Health

## 2014-12-19 ENCOUNTER — Ambulatory Visit: Payer: BLUE CROSS/BLUE SHIELD | Admitting: Adult Health

## 2014-12-19 NOTE — Telephone Encounter (Signed)
[name] called me to cancel her Survivorship Clinic appt scheduled for 12pm today.  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  I offered to reschedule, but she prefers to call me back when she is available.  She told me that she is scheduled to have her port removed in May and would like to see me sometime after that procedure is completed.  Therefore, I will not reschedule her appt at this time and will wait for her to contact me to schedule at her convenience.  I offered understanding and encouraged her to call me for any questions or concerns she may have before she returns to the cancer center.   Mike Craze, NP [employer] [phone]

## 2015-01-18 ENCOUNTER — Ambulatory Visit (HOSPITAL_BASED_OUTPATIENT_CLINIC_OR_DEPARTMENT_OTHER): Admit: 2015-01-18 | Payer: Self-pay | Admitting: General Surgery

## 2015-01-18 ENCOUNTER — Encounter (HOSPITAL_BASED_OUTPATIENT_CLINIC_OR_DEPARTMENT_OTHER): Payer: Self-pay

## 2015-01-18 SURGERY — REMOVAL PORT-A-CATH
Anesthesia: Choice

## 2015-01-22 ENCOUNTER — Telehealth: Payer: Self-pay | Admitting: Adult Health

## 2015-01-22 ENCOUNTER — Telehealth: Payer: Self-pay | Admitting: *Deleted

## 2015-01-22 NOTE — Telephone Encounter (Signed)
TC from patient requesting antibiotic for a 'sore tooth'. Denies swelling in area or drainage. Denies fever. She has not scheduled an appointment with dentist yet. Encouraged her to do that. She has finished her treatments here @ [facility].

## 2015-01-22 NOTE — Telephone Encounter (Signed)
Ms. Noyce called to reschedule her Survivorship Clinic appointment with me.  She is now scheduled to see me on Monday, 01/29/15 at 3:30pm.  I gave her instructions on where to check in for this appointment.  I also encouraged her to call me via my direct office number for any questions or concerns before her appointment.  I look forward to participating in her care.   Mike Craze, NP [place] [phone]

## 2015-01-22 NOTE — Telephone Encounter (Signed)
This RN returned call to pt and obtained identified VM.  Message left informing pt best to contact a dentist per concern- or proceed to an urgent care facility.  This RN's name and return number given.

## 2015-01-29 ENCOUNTER — Ambulatory Visit (HOSPITAL_BASED_OUTPATIENT_CLINIC_OR_DEPARTMENT_OTHER): Payer: BLUE CROSS/BLUE SHIELD | Admitting: Adult Health

## 2015-01-29 VITALS — BP 152/84 | HR 94 | Temp 98.8°F | Resp 16

## 2015-01-29 DIAGNOSIS — C50511 Malignant neoplasm of lower-outer quadrant of right female breast: Secondary | ICD-10-CM | POA: Diagnosis not present

## 2015-01-29 DIAGNOSIS — Z171 Estrogen receptor negative status [ER-]: Secondary | ICD-10-CM

## 2015-01-30 ENCOUNTER — Encounter: Payer: Self-pay | Admitting: Adult Health

## 2015-01-30 NOTE — Progress Notes (Signed)
CLINIC:  Cancer Survivorship   REASON FOR VISIT:  Routine follow-up post-treatment for a recent history of breast cancer.  BRIEF ONCOLOGIC HISTORY:    Breast cancer of lower-outer quadrant of right female breast   12/20/2013 Breast US Right breast irregular shadowing hypoechoic mass at 6 o'clock location measuring 1.4 x 1.3 x 1.2 cm. No right axillary lymphadenopathy noted.     12/20/2013 Mammogram Recalled from screening mammogram for possible right breast mass.  Diagnostic mammo confirms presence of mildly irregular oval 1.3 cm right breast mass at 6 o'clock location.  Palpable on physical exam.  Nothing suspicious in left breast.    12/29/2013 Initial Biopsy Right breast needle core biopsy revealing grade 3 IDC. ER neg./PR neg. /HER2 neg. (ratio 1.35). Ki67 is 80%.    01/10/2014 Initial Diagnosis Breast cancer of lower-outer quadrant of right female breast   01/12/2014 Procedure Genetic testing negative. Genes analyzed included: BRCA1, BRCA2, CDH1, PTEN, TP53, and PALB2.    01/12/2014 Breast MRI Right breast, LOQ with 2.2 x 1.9 x 2 cm mass.  No MR findings of multicentric or contralateral disease.  Cortical thickening noted in right axillary LN. Numerous scattered breast masses with benign MR features.    01/26/2014 Echocardiogram Pre-chemo EF: 50-55%.    02/08/2014 Definitive Surgery Right lumpectomy with SLNB Donne Hazel): Grade 3 IDC, spanning 2.1 cm.  Negative margins.  0/1 right axilla SLN positive. Prognostic profile repeated and again was ER neg./PR neg./ HER2 neg. (ratio 1.15).    02/08/2014 Pathologic Stage pT2pN0; Stage IIA   03/02/2014 - 04/13/2014 Adjuvant Chemotherapy Dose-dense AC x 4 (Magrinat).   05/05/2014 - 06/30/2014 Adjuvant Chemotherapy Only completed 7/12 planned treatments with Carbo/Abraxane. Planned treatment was weekly Carboplatin & Abraxane x 12.  Cycle #5, Carbo dose-reduced by 15% due to cytopenias.  Chemo stopped after cycle #7 due to peripheral neuropathy.  (Magrinat)   08/22/2014 - 10/11/2014 Radiation Therapy Adjuvant RT completed Isidore Moos). Right breast: total dose 50.4 Gy over 28 fractions.  Right breast boost: total dose 10 Gy over 5 fractions.    01/29/2015 Survivorship Survivorship Care Plan given to patient and reviewed with her in person.      INTERVAL HISTORY:  [name] presents to the [facility] today for our initial meeting to review her survivorship care plan detailing her treatment course for breast cancer, as well as monitoring long-term side effects of that treatment, education regarding health maintenance, screening, and overall wellness and health promotion.     Overall, [name] reports feeling quite well since completing her radiation therapy approximately 3 month ago.  She endorses some pain in her right knee (chronic issue) and peripheral neuropathy.  The peripheral neuropathy is constant, which some activities making the pain worse than others.  She currently takes gabapentin $RemoveBeforeDE'300mg'VumQcdLHOPKuzrM$  TID, but states that she isn't sure if it is helping enough.  She tells me that she has previously talked about this with her PCP, but [name] didn't want to adjust the medication at that time.  She is considering either increasing the dose or how often she takes the gabapentin and would like to discuss that further with me today. She is also very interested in learning more about healthy eating/cooking and weight loss strategies as a cancer survivor.  She endorses that her "spirits"/mood have been very positive and she denies any anxiety or depression. She has wonderful support in her family and friends.    REVIEW OF SYSTEMS:  General: Denies fever, chills, unintentional weight loss, or  generalized fatigue.  Cardiac: Denies palpitations, chest pain, and lower extremity edema.  Respiratory: Denies cough, shortness of breath, and dyspnea on exertion.  GI: Denies abdominal pain, constipation, diarrhea, nausea, or vomiting.  GU: Denies dysuria,  hematuria, vaginal bleeding, vaginal discharge, or vaginal dryness.  Musculoskeletal: Chronic right knee pain.   Neuro: Denies headache or recent falls. Endorses peripheral neuropathy as above. Skin: Denies rash, pruritis, or open wounds.  Breast: Denies any new nodularity, masses, tenderness, nipple changes, or nipple discharge.  Psych: Denies depression, anxiety, insomnia, or memory loss.   A 14-point review of systems was completed and was negative, except as noted above.   ONCOLOGY TREATMENT TEAM:  1. Surgeon:  Dr. Donne [facility]el at [facility] 2. Medical Oncologist: Dr. Jana Hakim  3. Radiation Oncologist: Dr. Isidore Moos    PAST MEDICAL/SURGICAL HISTORY:  Past Medical History  Diagnosis Date  . HTN (hypertension)   . Diabetes mellitus   . High cholesterol   . Family history of malignant neoplasm of breast   . Glaucoma   . Wears glasses   . Wears dentures     full top-partial bottom  . Breast cancer 12/29/13    right 6:00 o'clock, lower outer  . History of radiation therapy 08/12/14- 10/11/14    right breast /50.4 Gy/28 fx, right breast boost/ 10 Gy/ 5 fx   Past Surgical History  Procedure Laterality Date  . Transphenoidal pituitary resection  2006  . Breast biopsy  2000    LEFT  . Tonsillectomy    . Portacath placement Left 02/08/2014    Procedure: INSERTION PORT-A-CATH;  Surgeon: Rolm Bookbinder, MD;  Location: [place];  Service: General;  Laterality: Left;  . Breast lumpectomy with axillary lymph node biopsy Right 02/08/14    invasive ductal  . Abdominal hysterectomy  1993    endometriosis     ALLERGIES:  No Known Allergies   CURRENT MEDICATIONS:  Current Outpatient Prescriptions on File Prior to Visit  Medication Sig Dispense Refill  . amLODipine (NORVASC) 10 MG tablet TAKE ONE TABLET BY MOUTH ONCE DAILY    . Atorvastatin Calcium (LIPITOR PO) Take 40 mg by mouth daily.     Marland Kitchen glucose monitoring kit (FREESTYLE) monitoring kit One Touch Ultra  machine and strips, patient tests her blood sugars bid    . glyBURIDE (DIABETA) 5 MG tablet Take 5 mg by mouth daily with breakfast.     . lisinopril-hydrochlorothiazide (PRINZIDE,ZESTORETIC) 20-12.5 MG per tablet Take 2 tablets by mouth daily.     . metFORMIN (GLUCOPHAGE-XR) 500 MG 24 hr tablet Take 500 mg by mouth 2 (two) times daily.     . ONE TOUCH ULTRA TEST test strip     . [DISCONTINUED] gabapentin (NEURONTIN) 300 MG capsule Take 1 capsule (300 mg total) by mouth 3 (three) times daily. 90 capsule 6   No current facility-administered medications on file prior to visit.     ONCOLOGIC FAMILY HISTORY:  Family History  Problem Relation Age of Onset  . Hypertension Mother   . Diabetes type II Mother   . Breast cancer Mother     dx ~55; deceased 63  . Hypertension Father   . Breast cancer Maternal Aunt     deceased 31  . Cancer Maternal Uncle     unk. type; deceased 69s  . Cancer Maternal Uncle     unk. type; deceased late 72s  . Uterine cancer Cousin 20    daughter of an unaffected mat aunt who is  23    SOCIAL HISTORY:  TONYE TANCREDI is widowed and lives alone in [place], [place].  She has 1 daughter who lives in [place].  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  She denies any current tobacco, alcohol, or illicit drug use.     PHYSICAL EXAMINATION:  Vital Signs:   Filed Vitals:   01/29/15 1615  BP: 152/84  Pulse: 94  Temp: 98.8 F (37.1 C)  Resp: 16   General: Well-nourished, well-appearing female in no acute distress.  She is unaccompanied today.   HEENT: Head is atraumatic and normocephalic.  Pupils equal and reactive to light and accomodation. Conjunctivae clear without exudate.  Sclerae anicteric. Oral mucosa is pink, moist, and intact without lesions.  Oropharynx is pink without lesions or erythema.  Lymph: No cervical, supraclavicular, infraclavicular, or axillary lymphadenopathy noted on  palpation.  Cardiovascular: Regular rate and rhythm without murmurs, rubs, or gallops. Respiratory: Clear to auscultation bilaterally. Chest expansion symmetric without accessory muscle use on inspiration or expiration.  GI: Abdomen soft and round. No tenderness to palpation. Bowel sounds normoactive in 4 quadrants. No hepatosplenomegaly.   GU: Deferred.  Musculoskeletal: Muscle strength 5/5 in all extremities.  Full ROM noted in all extremities.  Neuro: No focal deficits. Steady gait.  Psych: Mood and affect normal and appropriate for situation.  Extremities: No edema, cyanosis, or clubbing.  Skin: Warm and dry. No open lesions noted.   LABORATORY DATA:  None for this visit.  DIAGNOSTIC IMAGING:  None for this visit.     ASSESSMENT AND PLAN:   1. History of breast cancer:  [name] will follow-up with her medical oncologist, Gentry Fitz, NP/Dr. Magrinat in 04/2015 with history and physical exam per surveillance protocol.  A comprehensive survivorship care plan and treatment summary was reviewed with the patient today detailing her breast cancer diagnosis, treatment course, potential late/long-term effects of treatment, appropriate follow-up care with recommendations for the future, and patient education resources.  A copy of this summary, along with a letter will be sent to the patient's primary care provider via mail/fax/In Basket message after today's visit.  [name] is welcome to return to the Survivorship Clinic in the future, as needed; no follow-up will be scheduled at this time.    2. Peripheral neuropathy: [name]'s chemotherapy regimen included Abraxane, which is one of the chemotherapy agents that is known to have the potential to cause long-standing or permanent peripheral neuropathy.  Her Abraxane regimen was actually stopped after 7 of the planned 12 doses due to complaints of neuropathy.  [name] also has a history of diabetes, which could also be  contributing to her symptom burden.  She describes the discomfort as constant and the gabapentin $RemoveBefore'300mg'GhlAXYPNupBog$  TID offers some relief, but not complete relief.  She has good motor dexterity of her fingers, but has some challenges with light touch sensation and grasping with her hands by her report.  Her feet are also affected and she is concerned that as she increases her physical activity, she will have more neuropathic pain.  We discussed the use of gabapentin at both increased frequency and increased dosing.  [name] tells me that she has recently refilled her prescription and would like to keep the dosage the same, so as not to waste the pills she has.  We discussed increasing the gabapentin $RemoveBefore'300mg'YeLGRlxYRGden$  to 4x/day to see if this extra dose offers any additional relief.  She agrees  with this plan and will call me when she needs a refill to see if this increase in frequency has helped her symptoms at all.  If it has, then I will refill her gabapentin at the same dose with QID dosing.  If she tells me that she does not have improved symptoms, then we discussed going up on the dose and/or switching drugs to something like pregabalin as potential options.  She agrees with the current plan to try the gabapentin $RemoveBefore'300mg'mAIwdrFjRamEm$  QID to see if this helps and if she is able to tolerate this with regards to feeling sleepy/groggy during the day.  She verbalized understanding of this plan and agrees with it.    3. Healthy weight management: [name] is very interested in healthy eating and weight loss as a cancer survivor.  I offered her enrollment in the [employer] fitness program for survivors and she is very interested in participating. I have placed a referral for [employer]'s director to contact the patient with more details.  We also discussed the importance of eating fresh fruits and vegetables, lean meats, and participating in physical activity to help with weight loss. I gave her several handouts to help with food choices  including "The Nutrition Rainbow", which details specific foods that offer antioxidant properties for survivors, as well as "Nutrition for Breast Cancer Survivors" from the Academy of Nutrition & Dietetics.  She also received a booklet from the [employer] with guidelines for physical activity and nutrition.  We discussed how her diabetes management will be important in her diet and exercise planning to maintain her safety with regards to blood sugar levels.  I offered her a formal dietician consult at the Diabetes/Nutrition office, but she declines at this time.  Therefore, I have asked our oncology Registered Dietician to mail [name] some additional resources she may find helpful in making good food choices and healthy meal planning.   4. Bone health:  Given [name] age and history of breast cancer, she is at risk for bone demineralization.  She will be eligible for a DEXA scan at age 32, or earlier if clinically indicated.  In the meantime, she was encouraged to increase her consumption of foods rich in calcium, as well as increase her weight-bearing activities.  She was given education on specific activities to promote bone health.  5. Cancer screening:  Due to [name]'s history and her age, she should receive screening for skin cancers, colon cancer, and gynecologic cancers.  The information and recommendations are listed on the patient's comprehensive care plan/treatment summary and were reviewed in detail with the patient.    6. Health maintenance and wellness promotion: [name] was again encouraged to consume 5-7 servings of fruits and vegetables per day. She was also encouraged to engage in moderate to vigorous exercise for 30 minutes per day most days of the week. She was instructed to limit her alcohol consumption and continue to abstain from tobacco use.   7. Support services/counseling: It is not uncommon for this period of the patient's cancer care trajectory  to be one of many emotions and stressors. I encouraged [name] to participate in the next session of our "Finding Your New Normal" [facility]) support group series, where cancer survivors explore how their lives have changed as a result of cancer and best ways to cope.  I have gotten her registered for the next [facility] class that will be held in the Fall.  [name] was encouraged to  take advantage of our many other support services programs, support groups, and/or counseling in coping with her new life as a cancer survivor after completing anti-cancer treatment.  She was offered support today through active listening and expressive supportive counseling.  She was given information regarding our available services and encouraged to contact me with any questions or for help enrolling in any of our support group/programs.    A total of 45 minutes of face-to-face time was spent with this patient with greater than 50% of that time in counseling and care-coordination.   Mike Craze, NP [place] [phone]   Note: PRIMARY CARE PROVIDER Chesley Noon, [place] [phone]

## 2015-01-31 ENCOUNTER — Encounter: Payer: Self-pay | Admitting: Nutrition

## 2015-01-31 NOTE — Progress Notes (Signed)
Received request to mail patient additional information on healthy diet and weight loss strategies. Provided patient with multiple resources and web sites where she could customize a weight loss plan based on her dietary choices and activity level. Patient declined an appointment with a Dietitian at [facility] for weight loss counseling.

## 2015-03-05 ENCOUNTER — Telehealth: Payer: Self-pay | Admitting: *Deleted

## 2015-03-05 NOTE — Telephone Encounter (Signed)
On 03-05-15 fax medical records to [facility] it was consult note, sim & planning note, end of tx note, follow up note.

## 2015-03-21 ENCOUNTER — Telehealth: Payer: Self-pay | Admitting: Adult Health

## 2015-03-21 NOTE — Telephone Encounter (Signed)
I received a message from Johnnye Lana, [place] that Ms. Fialkowski was trying to reach me.  I called and left Ms. Templin a voicemail and asked her to return my call when she could and left my direct office number.   Mike Craze, NP [place] [phone]

## 2015-04-17 ENCOUNTER — Encounter: Payer: Self-pay | Admitting: Nurse Practitioner

## 2015-04-19 ENCOUNTER — Telehealth: Payer: Self-pay | Admitting: Nurse Practitioner

## 2015-04-19 ENCOUNTER — Other Ambulatory Visit: Payer: Self-pay | Admitting: Adult Health

## 2015-04-19 ENCOUNTER — Encounter: Payer: Self-pay | Admitting: Adult Health

## 2015-04-19 DIAGNOSIS — C50511 Malignant neoplasm of lower-outer quadrant of right female breast: Secondary | ICD-10-CM

## 2015-04-19 DIAGNOSIS — G629 Polyneuropathy, unspecified: Secondary | ICD-10-CM

## 2015-04-19 MED ORDER — PREGABALIN 50 MG PO CAPS: 50.0000 mg | ORAL_CAPSULE | Freq: Two times a day (BID) | ORAL | Status: DC

## 2015-04-19 NOTE — Telephone Encounter (Signed)
Returned Advertising account executive. Confirmed appointment moved from 08/18 to 08/24.

## 2015-04-19 NOTE — Progress Notes (Signed)
I received a phone call from Ms. [name] letting me know that she feels that her peripheral neuropathy is not getting any better and she is still feeling the numbness and pain.  At our office visit back on 01/29/15, we discussed increasing gabapentin 300mg  from TID to QID.  Ms. [name] tells me that she was not able to increase the frequency because the medicine made her feel too sleepy.  She said, "I take one pill and I'm so drowsy, so I haven't been taking a lot of it."    We discussed stopping the gabapentin and starting Lyrica at low-dose instead.  We discussed common side effects of Lyrica, including drowsiness and dizziness, particularly on initiation of the medication.  I discussed with PharmD and they concurred that starting on low-dose and titrating to the minimum effective dose is the best strategy.  Therefore, I ordered Lyrica 50mg  BID.  I encouraged Ms. [name] not to take the first dose of the medication until she was home safely this evening and not to drive until she knows about the medication is going to make her feel. I expressed my concern for her safety.  I encouraged her to call me in the next few days and let me know how she is feeling.   I printed out the prescription, along with the patient education info sheet about Lyrica at the front desk of the cancer center and she will pick up the prescription later today.  She has my direct number and I encouraged her to call me anytime with questions or concerns.  We will want to see her back in a few weeks to f/u on her symptoms with this new medication.   Mike Craze, NP [place] [phone]

## 2015-04-20 ENCOUNTER — Other Ambulatory Visit: Payer: Self-pay | Admitting: Adult Health

## 2015-04-20 ENCOUNTER — Telehealth: Payer: Self-pay | Admitting: Adult Health

## 2015-04-20 ENCOUNTER — Encounter: Payer: Self-pay | Admitting: Oncology

## 2015-04-20 DIAGNOSIS — G629 Polyneuropathy, unspecified: Secondary | ICD-10-CM

## 2015-04-20 DIAGNOSIS — C50511 Malignant neoplasm of lower-outer quadrant of right female breast: Secondary | ICD-10-CM

## 2015-04-20 MED ORDER — GABAPENTIN 300 MG PO CAPS: 300.0000 mg | ORAL_CAPSULE | Freq: Every day | ORAL | Status: DC

## 2015-04-20 MED ORDER — GABAPENTIN 100 MG PO CAPS: 100.0000 mg | ORAL_CAPSULE | Freq: Three times a day (TID) | ORAL | Status: DC

## 2015-04-20 NOTE — Telephone Encounter (Signed)
I received notification today that [name] was unable to have the Lyrica filled due to her insurance. Therefore, I spoke with her on the phone today and we discussed a few other options.   Since the gabapentin 300mg  makes her drowsy, I suggested that we try 100mg  TID for her to take during the day. My hope is that this change may offer some relief, but I explained that it likely would not completely alleviate her symptoms. Then, we discussed taking the 300mg  at bedtime. I also discussed the possibility of sending her for physical therapy. She prefers to try the new medication regimen with gabapentin at this time.   I e-prescribed 1 month's worth of the gabapentin 100mg  TID and gabapentin 300mg  QHS for [name]. I educated her on the changes in the dosing and frequency and she expressed verbal understanding. I encouraged her to give me a call within the next week or so to let me know how she is feeling.   She expressed appreciation for my call.   Mike Craze, NP [place] [phone]

## 2015-04-20 NOTE — Progress Notes (Signed)
I sent message to heather to see if there is an alternate for lyrica for this patient. Her insurance will not cover

## 2015-04-20 NOTE — Progress Notes (Signed)
I received notification today that [name] was unable to have the Lyrica filled due to her insurance.  Therefore, I spoke with her on the phone today and we discussed a few other options.    Since the gabapentin 300mg  makes her drowsy, I suggested that we try 100mg  TID for her to take during the day.  My hope is that this change may offer some relief, but I explained that it likely would not completely alleviate her symptoms.  Then, we discussed taking the 300mg  at bedtime.   I also discussed the possibility of sending her for physical therapy.  She prefers to try the new medication regimen at this time.    I e-prescribed 1 month's worth of the gabapentin 100mg  TID and gabapentin 300mg  QHS for [name]. I educated her on the changes in the dosing and frequency and she expressed verbal understanding.  I encouraged her to give me a call within the next week or so to let me know how she is feeling.    She expressed appreciation for my call.   Mike Craze, NP [place] [phone]

## 2015-04-23 ENCOUNTER — Other Ambulatory Visit: Payer: Self-pay | Admitting: Adult Health

## 2015-04-26 ENCOUNTER — Ambulatory Visit: Payer: BLUE CROSS/BLUE SHIELD | Admitting: Nurse Practitioner

## 2015-05-02 ENCOUNTER — Ambulatory Visit (HOSPITAL_BASED_OUTPATIENT_CLINIC_OR_DEPARTMENT_OTHER): Payer: BLUE CROSS/BLUE SHIELD | Admitting: Nurse Practitioner

## 2015-05-02 VITALS — BP 152/79 | HR 93 | Temp 98.3°F | Resp 20 | Wt 255.5 lb

## 2015-05-02 DIAGNOSIS — Z171 Estrogen receptor negative status [ER-]: Secondary | ICD-10-CM | POA: Diagnosis not present

## 2015-05-02 DIAGNOSIS — C50511 Malignant neoplasm of lower-outer quadrant of right female breast: Secondary | ICD-10-CM | POA: Diagnosis not present

## 2015-05-02 DIAGNOSIS — D649 Anemia, unspecified: Secondary | ICD-10-CM

## 2015-05-03 ENCOUNTER — Encounter: Payer: Self-pay | Admitting: Nurse Practitioner

## 2015-05-03 NOTE — Progress Notes (Signed)
[place]  [phone] Fax:([phone]     ID: [name] OB: [birth year]  MR#: [id number]  GSU#:[id number]  PCP: Penny Noon, Owens GYN:   SU: [name] OTHER Owens: [name]  CHIEF COMPLAINT: Triple negative breast cancer  CURRENT TREATMENT: observation  BREAST CANCER HISTORY: From the original intake note 01/18/2014:  The patient had routine bilateral screening mammography at the [facility] Center for 10/28/2013 showing a possible mass in the right breast. Additional views and right breast ultrasonography 12/20/2013 confirmed an irregular 1.3 cm right breast mass at the 6:00 position. This was not palpable. Ultrasound showed an irregular hypoechoic mass in the right breast measuring 1.4 cm. There was no right axillary adenopathy identified.  On 12/29/2013 the patient underwent right breast biopsy, showing ([id number]) and invasive ductal carcinoma, grade 3, estrogen and progesterone receptor negative, HER-2 not amplified with a signals ratio of 1.35 and a copy number per cell of 2.10, and an MIB-1 of 80%.  On 01/12/2014 the patient underwent bilateral breast MRI. This showed the mass in the right breast lower outer quadrant to measure 2.2 cm maximally. There was a 2 cm well circumscribed mass in the medial right breast which has been stable since 2011 him a and a 1 cm oval mass in the upper midline of the right breast, likely an intramammary lymph node, with benign features. A right axillary lymph node with cortical thickening measured 0.9 cm. The fatty hilum was possibly effaced; note Right axilla had been benign by [place] before biopsy. In the left breast there were no findings of concern.  The patient's subsequent history is as detailed below.  INTERVAL HISTORY: [name] returns today for follow up of her breast cancer. 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 She was unable to afford lyrica, but the gabapentin that was substituted is helping, especially in her hands the. The interval history is also remarkable for blood sugars in the 400s. Her glucometer at home is broken and she found this out during a visit to another doctor. Her PCP gave her a sample of a new drug (she cannot recall the name) to take in conjunction with the metformin and glyburide, and she already feels better. She has less thirst and her urine no longer smells fruity.   REVIEW OF SYSTEMS: A detailed review of systems is otherwise completely negative, except where noted above.    PAST MEDICAL HISTORY: Past Medical History  Diagnosis Date  . HTN (hypertension)   . Diabetes mellitus   . High cholesterol   . Family history of malignant neoplasm of breast   . Glaucoma   . Wears glasses   . Wears dentures     full top-partial bottom  . Breast cancer 12/29/13    right 6:00 o'clock, lower outer  . History of radiation therapy 08/12/14- 10/11/14    right breast /50.4 Gy/28 fx, right breast boost/ 10 Gy/ 5 fx    PAST SURGICAL HISTORY: Past Surgical History  Procedure Laterality Date  . Transphenoidal pituitary resection  2006  . Breast biopsy  2000    LEFT  . Tonsillectomy    . Portacath placement Left 02/08/2014    Procedure: INSERTION PORT-A-CATH;  Surgeon: [name];  Location: [facility];  Service: General;  Laterality: Left;  . Breast lumpectomy with axillary lymph node biopsy Right 02/08/14    invasive ductal  .  Abdominal hysterectomy  1993    endometriosis    FAMILY HISTORY Family History  Problem Relation Age of Onset  . Hypertension Mother   . Diabetes type II Mother   . Breast cancer Mother     dx ~64; deceased 91  . Hypertension Father   . Breast cancer Maternal Aunt     deceased 5  . Cancer Maternal Uncle     unk. type; deceased 31s  . Cancer Maternal Uncle     unk. type; deceased late 35s  .  Uterine cancer Cousin 30    daughter of an unaffected mat aunt who is 58   the patient's father is in his early 83s. The patient's mother died at the age of 62, from complications of breast cancer which had been diagnosed "years before". The patient had 2 brothers, 2 sisters. The only other breast cancer in the family was a maternal aunt who died from breast cancer at the age of 38. (This means that 2 of 3 sisters, including the patient's mother, had breast cancer, one of them before the age of 60). There is no history of ovarian cancer in the family.  GYNECOLOGIC HISTORY:  Menarche age 3, first live birth age 15. The patient is GX P1. She underwent total abdominal hysterectomy with bilateral salpingo-oophorectomy at age 89. She took hormone replacement for one or 2 months old.  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 The patient is a widow. She lives by herself, with no pets. Her daughter [name] lives in [place], where she works in a Geneticist, molecular. The patient has no grandchildren. She is a Psychologist, forensic   ADVANCED DIRECTIVES: Not in place; advanced directives were discussed with the patient at her initial visit 01/18/2014. She intends to name her daughter as healthcare power of attorney. [name] can be reached at [phone]   HEALTH MAINTENANCE: Social History  Substance Use Topics  . Smoking status: Former Smoker    Types: Cigarettes    Quit date: 01/31/1994  . Smokeless tobacco: Not on file  . Alcohol Use: Yes     Comment: occ     Colonoscopy:  PAP: Status post hysterectomy  Bone density: Remote  Lipid panel:  No Known Allergies  Current Outpatient Prescriptions  Medication Sig Dispense Refill  . amLODipine (NORVASC) 10 MG tablet TAKE ONE TABLET BY  MOUTH ONCE DAILY    . Atorvastatin Calcium (LIPITOR PO) Take 40 mg by mouth daily.     Marland Kitchen gabapentin (NEURONTIN) 100 MG capsule Take 1 capsule (100 mg total) by mouth 3 (three) times daily. at mealtimes. 90 capsule 0  . gabapentin (NEURONTIN) 300 MG capsule Take 1 capsule (300 mg total) by mouth at bedtime. 30 capsule 0  . glucose monitoring kit (FREESTYLE) monitoring kit One Touch Ultra machine and strips, patient tests her blood sugars bid    . glyBURIDE (DIABETA) 5 MG tablet Take 5 mg by mouth daily with breakfast.     . lisinopril-hydrochlorothiazide (PRINZIDE,ZESTORETIC) 20-12.5 MG per tablet Take 2 tablets by mouth daily.     . metFORMIN (GLUCOPHAGE-XR) 500 MG 24 hr tablet Take 500 mg by mouth 2 (two) times daily.     . ONE TOUCH ULTRA TEST test strip     . pregabalin (LYRICA) 50  MG capsule Take 1 capsule (50 mg total) by mouth 2 (two) times daily. 60 capsule 0   No current facility-administered medications for this visit.    OBJECTIVE:  Middle-aged African-American woman in no acute distress Filed Vitals:   05/02/15 1520  BP: 152/79  Pulse: 93  Temp: 98.3 F (36.8 C)  Resp: 20    ECOG FS:1 - Symptomatic but completely ambulatory   Skin: warm, dry  HEENT: sclerae anicteric, conjunctivae pink, oropharynx clear. No thrush or mucositis.  Lymph Nodes: No cervical or supraclavicular lymphadenopathy  Lungs: clear to auscultation bilaterally, no rales, wheezes, or rhonci  Heart: regular rate and rhythm  Abdomen: round, soft, non tender, positive bowel sounds  Musculoskeletal: No focal spinal tenderness, no peripheral edema  Neuro: non focal, well oriented, positive affect  Breasts: right breast status post lumpectomy and radiation. Residual hyperpigmentation noted. No evidence of recurrent disease. Right axilla benign. Left breast unremarkable.   LAB RESULTS:  CMP  I No results found for: SPEP  Lab Results  Component Value Date   WBC 4.8 11/15/2014   NEUTROABS 3.2  11/15/2014   HGB 9.8* 11/15/2014   HCT 32.2* 11/15/2014   MCV 91.8 11/15/2014   PLT 249 11/15/2014      Chemistry      Component Value Date/Time   NA 138 11/15/2014 1309   NA 141 02/03/2014 1400   K 4.8 11/15/2014 1309   K 4.4 02/03/2014 1400   CL 100 02/03/2014 1400   CO2 21* 11/15/2014 1309   CO2 24 02/03/2014 1400   BUN 22.4 11/15/2014 1309   BUN 20 02/03/2014 1400   CREATININE 1.2* 11/15/2014 1309   CREATININE 1.16* 02/03/2014 1400   GLU 353* 04/07/2014 1113      Component Value Date/Time   CALCIUM 9.5 11/15/2014 1309   CALCIUM 10.0 02/03/2014 1400   ALKPHOS 124 11/15/2014 1309   ALKPHOS 114 02/03/2014 1400   AST 29 11/15/2014 1309   AST 15 02/03/2014 1400   ALT 46 11/15/2014 1309   ALT 15 02/03/2014 1400   BILITOT 0.26 11/15/2014 1309   BILITOT 0.3 02/03/2014 1400       Lab Results  Component Value Date   LABCA2 51* 02/03/2014    No components found for: [id number]  No results for input(s): INR in the last 168 hours.  Urinalysis No results found for: COLORURINE  STUDIES: No results found.  EXAM: DIGITAL DIAGNOSTIC BILATERAL MAMMOGRAM WITH 3D TOMOSYNTHESIS AND CAD  COMPARISON: 02/08/2014, 02/02/2014, additional prior studies dating back to 07/31/2008  ACR Breast Density Category c: The breast tissue is heterogeneously dense, which may obscure small masses.  FINDINGS: New postlumpectomy and postradiation changes are noted of the right breast. No suspicious mass, calcifications, or other abnormality is identified within either breast.  Mammographic images were processed with CAD.  IMPRESSION: No mammographic evidence of malignancy.  RECOMMENDATION: Bilateral diagnostic mammogram in 1 year.  I have discussed the findings and recommendations with the patient. Results were also provided in writing at the conclusion of the visit. If applicable, a reminder letter will be sent to the patient regarding the next appointment.  BI-RADS  CATEGORY 2: Benign.   Electronically Signed  By: Pamelia Hoit M.D.  On: 12/13/2014 09:35  ASSESSMENT: 59 y.o. BRCA negative [place] woman status post right breast biopsy 12/29/2013 for a clinical T2 N1, stage IIB invasive ductal carcinoma, grade 3, triple negative, with an MIB-1 of 80%.  (1) status post right lumpectomy and sentinel lymph node sampling  02/08/2014 for a pT2 pN0, stage IIA invasive ductal carcinoma, grade 3, repeat prognostic panel again triple negative  (2) adjuvant chemotherapy with doxorubicin and cyclophosphamide in dose dense fashion x4 completed 04/13/2014,  followed by carboplatin and Abraxane weekly x7 completed 06/30/2014  (a) additional 5 planned cycles cancelled because of neuropathy  (3) adjuvant radiation completed 10/11/2014  1) Right breast /50.4Gy in 28 Fractions  2) Right breast boost / 10 Gy in 5 fractions  (4) status post remote TAH BSO   (5) Status post removal of a pituitary adenoma 09/05/2005, no malignancy identified  (6) moderately controlled hypertension  (7) Anemia - last requiring blood transfusion on 06/24/14  PLAN: Keilly is doing well since she has completed all of her prescribed treatments. The labs were reviewed in detail and were stable. She will cotinue to work closely with her PCP regarding her diabetes. She is heading to a diabetes education class soon, and thinks she may be able to acquire a working glucometer at this event. She states that she has learned her lesson and will pay closer attention to her diet. She will continue on gabapentin for her neuropathy  Margalit will see her surgeon late this fall and follow back up with [place] in 6 months. She understands and agrees with this plan. She has been encouraged to call with any issues that might arise before her next visit here.   Laurie Panda, NP  05/03/2015 8:52 AM

## 2015-05-25 DIAGNOSIS — IMO0002 Reserved for concepts with insufficient information to code with codable children: Secondary | ICD-10-CM | POA: Insufficient documentation

## 2015-05-25 DIAGNOSIS — E1165 Type 2 diabetes mellitus with hyperglycemia: Secondary | ICD-10-CM | POA: Insufficient documentation

## 2015-05-25 DIAGNOSIS — Z6841 Body Mass Index (BMI) 40.0 and over, adult: Secondary | ICD-10-CM | POA: Insufficient documentation

## 2015-06-05 ENCOUNTER — Other Ambulatory Visit: Payer: Self-pay

## 2015-06-05 DIAGNOSIS — G629 Polyneuropathy, unspecified: Secondary | ICD-10-CM

## 2015-06-05 DIAGNOSIS — C50511 Malignant neoplasm of lower-outer quadrant of right female breast: Secondary | ICD-10-CM

## 2015-06-05 MED ORDER — GABAPENTIN 100 MG PO CAPS: 100.0000 mg | ORAL_CAPSULE | Freq: Three times a day (TID) | ORAL | Status: DC

## 2015-06-20 ENCOUNTER — Other Ambulatory Visit: Payer: Self-pay | Admitting: Adult Health

## 2015-06-20 ENCOUNTER — Telehealth: Payer: Self-pay | Admitting: Adult Health

## 2015-06-20 DIAGNOSIS — G6289 Other specified polyneuropathies: Secondary | ICD-10-CM

## 2015-06-20 DIAGNOSIS — C50511 Malignant neoplasm of lower-outer quadrant of right female breast: Secondary | ICD-10-CM

## 2015-06-20 MED ORDER — GABAPENTIN 300 MG PO CAPS: 300.0000 mg | ORAL_CAPSULE | Freq: Every day | ORAL | Status: DC

## 2015-06-20 MED ORDER — GABAPENTIN 100 MG PO CAPS: 100.0000 mg | ORAL_CAPSULE | Freq: Three times a day (TID) | ORAL | Status: DC

## 2015-06-20 NOTE — Telephone Encounter (Signed)
Ms. [name] called to let me know that she needed refills of her gabapentin (300mg  QHS).  She tells me that taking the 100mg  TID and 300mg  QHS of the gabapentin is helping control her peripheral neuropathy symptoms very well.  I let her know that I had submitted refill approvals previously, but she stated that they had not gone through.  Therefore, I re-ordered them today.   I e-prescribed Gabapentin 300 mg QHS #30, with 6 refills, as well as Gabapentin 100 mg TID #90, with 6 refills to [facility] per patient request.  I encouraged her to call me with any additional concerns or questions.  She agreed to this plan.    Mike Craze, NP [place] [phone]

## 2015-07-23 ENCOUNTER — Encounter: Payer: Self-pay | Admitting: Genetic Counselor

## 2015-07-23 DIAGNOSIS — Z1379 Encounter for other screening for genetic and chromosomal anomalies: Secondary | ICD-10-CM | POA: Insufficient documentation

## 2015-09-14 MED FILL — GABAPENTIN 100 MG CAPSULE: 100 | 30 days supply | Qty: 90 | Fill #2

## 2015-09-14 MED FILL — GABAPENTIN 300 MG CAPSULE: 300 | 30 days supply | Qty: 30 | Fill #2

## 2015-10-25 ENCOUNTER — Encounter: Payer: Self-pay | Admitting: Nurse Practitioner

## 2015-10-27 MED FILL — GABAPENTIN 100 MG CAPSULE: 100 | 30 days supply | Qty: 90 | Fill #3

## 2015-10-29 MED FILL — GABAPENTIN 300 MG CAPSULE: 300 | 30 days supply | Qty: 30 | Fill #3

## 2015-11-07 ENCOUNTER — Telehealth: Payer: Self-pay | Admitting: Oncology

## 2015-11-07 ENCOUNTER — Other Ambulatory Visit: Payer: Self-pay | Admitting: *Deleted

## 2015-11-07 NOTE — Telephone Encounter (Signed)
Spoke with patient to confirm appt per 3/1 pof. March 15 @ 845 am

## 2015-11-08 ENCOUNTER — Encounter: Payer: Self-pay | Admitting: Adult Health

## 2015-11-08 NOTE — Progress Notes (Signed)
A birthday card was mailed to the patient today on behalf of the Survivorship Program at [facility].   Gretchen Dawson, NP Survivorship Program [facility] [phone]  

## 2015-11-21 ENCOUNTER — Ambulatory Visit: Payer: BLUE CROSS/BLUE SHIELD | Admitting: Nurse Practitioner

## 2015-12-03 ENCOUNTER — Encounter: Payer: Self-pay | Admitting: Nurse Practitioner

## 2015-12-03 ENCOUNTER — Ambulatory Visit (HOSPITAL_BASED_OUTPATIENT_CLINIC_OR_DEPARTMENT_OTHER): Payer: BLUE CROSS/BLUE SHIELD | Admitting: Nurse Practitioner

## 2015-12-03 ENCOUNTER — Telehealth: Payer: Self-pay | Admitting: Oncology

## 2015-12-03 VITALS — BP 163/84 | HR 102 | Temp 98.6°F | Resp 18 | Wt 259.3 lb

## 2015-12-03 DIAGNOSIS — D649 Anemia, unspecified: Secondary | ICD-10-CM

## 2015-12-03 DIAGNOSIS — Z853 Personal history of malignant neoplasm of breast: Secondary | ICD-10-CM | POA: Diagnosis not present

## 2015-12-03 DIAGNOSIS — C50511 Malignant neoplasm of lower-outer quadrant of right female breast: Secondary | ICD-10-CM

## 2015-12-03 DIAGNOSIS — I10 Essential (primary) hypertension: Secondary | ICD-10-CM | POA: Diagnosis not present

## 2015-12-03 MED FILL — GABAPENTIN 100 MG CAPSULE: 100 | 30 days supply | Qty: 90 | Fill #4

## 2015-12-03 MED FILL — GABAPENTIN 300 MG CAPSULE: 300 | 30 days supply | Qty: 30 | Fill #4

## 2015-12-03 NOTE — Telephone Encounter (Signed)
Gave and printed appt sched and avs for pt for Sept °

## 2015-12-03 NOTE — Progress Notes (Signed)
[name]  Telephone:([phone] Fax:(336) [phone]     ID: [name] OB: [birth year]  MR#: [id number]  WLN#:[id number]  PCP: Penny Noon, MD GYN:   SU: [name] OTHER MD: [name]  CHIEF COMPLAINT: Triple negative breast cancer  CURRENT TREATMENT: observation  BREAST CANCER HISTORY: From the original intake note 01/18/2014:  The patient had routine bilateral screening mammography at the breast Center for 10/28/2013 showing a possible mass in the right breast. Additional views and right breast ultrasonography 12/20/2013 confirmed an irregular 1.3 cm right breast mass at the 6:00 position. This was not palpable. Ultrasound showed an irregular hypoechoic mass in the right breast measuring 1.4 cm. There was no right axillary adenopathy identified.  On 12/29/2013 the patient underwent right breast biopsy, showing ([id number]) and invasive ductal carcinoma, grade 3, estrogen and progesterone receptor negative, HER-2 not amplified with a signals ratio of 1.35 and a copy number per cell of 2.10, and an MIB-1 of 80%.  On 01/12/2014 the patient underwent bilateral breast MRI. This showed the mass in the right breast lower outer quadrant to measure 2.2 cm maximally. There was a 2 cm well circumscribed mass in the medial right breast which has been stable since 2011 him a and a 1 cm oval mass in the upper midline of the right breast, likely an intramammary lymph node, with benign features. A right axillary lymph node with cortical thickening measured 0.9 cm. The fatty hilum was possibly effaced; note Right axilla had been benign by [place] before biopsy. In the left breast there were no findings of concern.  The patient's subsequent history is as detailed below.  INTERVAL HISTORY: [name] returns today for follow up of her breast cancer. She was selected for a breast cancer survivor retreat to the [facility] this weekend. [facility] [employer] [facility] and has hired a Physiological scientist. She is making better diet choices, but is a bit dismayed by the scale. She is in no pain. She takes gabapentin for hot flashes and residual neuropathy from chemotherapy at night. Her blood pressure has been consistently elevated for the past few months, but her blood glucose is under better control.  REVIEW OF SYSTEMS: A detailed review of systems is otherwise completely negative, except where noted above.    PAST MEDICAL HISTORY: Past Medical History  Diagnosis Date  . HTN (hypertension)   . Diabetes mellitus   . High cholesterol   . Family history of malignant neoplasm of breast   . Glaucoma   . Wears glasses   . Wears dentures     full top-partial bottom  . Breast cancer 12/29/13    right 6:00 o'clock, lower outer  . History of radiation therapy 08/12/14- 10/11/14    right breast /50.4 Gy/28 fx, right breast boost/ 10 Gy/ 5 fx    PAST SURGICAL HISTORY: Past Surgical History  Procedure Laterality Date  . Transphenoidal pituitary resection  2006  . Breast biopsy  2000    LEFT  . Tonsillectomy    . Portacath placement Left 02/08/2014    Procedure: INSERTION PORT-A-CATH;  Surgeon: Penny Bookbinder, MD;  Location: [facility];  Service: General;  Laterality: Left;  . Breast lumpectomy with axillary lymph node biopsy Right 02/08/14    invasive ductal  . Abdominal hysterectomy  1993    endometriosis    FAMILY HISTORY Family History  Problem Relation Age of Onset  . Hypertension Mother   .  Diabetes type II Mother   . Breast cancer Mother     dx ~28; deceased 84  . Hypertension Father   . Breast cancer Maternal Aunt     deceased 28  . Cancer Maternal Uncle     unk. type; deceased 34s  . Cancer Maternal Uncle     unk. type; deceased late 48s  . Uterine cancer Cousin 3    daughter of an unaffected mat aunt who is 49   the patient's father is in his early 14s. The patient's mother died at the age of 49, from  complications of breast cancer which had been diagnosed "years before". The patient had 2 brothers, 2 sisters. The only other breast cancer in the family was a maternal aunt who died from breast cancer at the age of 34. (This means that 2 of 3 sisters, including the patient's mother, had breast cancer, one of them before the age of 32). There is no history of ovarian cancer in the family.  GYNECOLOGIC HISTORY:  Menarche age 58, first live birth age 37. The patient is GX P1. She underwent total abdominal hysterectomy with bilateral salpingo-oophorectomy at age 21. She took hormone replacement for one or 2 months old.  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 The patient is a widow. She lives by herself, with no pets. Her daughter [name] lives in [place], where she works in a Geneticist, molecular. The patient has no grandchildren. She is a Psychologist, forensic   ADVANCED DIRECTIVES: Not in place; advanced directives were discussed with the patient at her initial visit 01/18/2014. She intends to name her daughter as healthcare power of attorney. [name] can be reached at [phone]   HEALTH MAINTENANCE: Social History  Substance Use Topics  . Smoking status: Former Smoker    Types: Cigarettes    Quit date: 01/31/1994  . Smokeless tobacco: Not on file  . Alcohol Use: Yes     Comment: occ     Colonoscopy:  PAP: Status post hysterectomy  Bone density: Remote  Lipid panel:  No Known Allergies  Current Outpatient Prescriptions  Medication Sig Dispense Refill  . amLODipine (NORVASC) 10 MG tablet TAKE ONE TABLET BY MOUTH ONCE DAILY    . Atorvastatin Calcium (LIPITOR PO) Take 40 mg by mouth daily.     Marland Kitchen gabapentin (NEURONTIN) 100 MG capsule Take 1 capsule (100 mg total) by mouth 3  (three) times daily. at mealtimes. 90 capsule 6  . gabapentin (NEURONTIN) 300 MG capsule Take 1 capsule (300 mg total) by mouth at bedtime. 30 capsule 6  . glucose monitoring kit (FREESTYLE) monitoring kit One Touch Ultra machine and strips, patient tests her blood sugars bid    . glyBURIDE (DIABETA) 5 MG tablet Take 5 mg by mouth daily with breakfast.     . lisinopril-hydrochlorothiazide (PRINZIDE,ZESTORETIC) 20-12.5 MG per tablet Take 2 tablets by mouth daily.     . metFORMIN (GLUCOPHAGE-XR) 500 MG 24 hr tablet Take 500 mg by mouth 2 (two) times daily.     . ONE TOUCH ULTRA TEST test strip     . Empagliflozin-Linagliptin (GLYXAMBI) 25-5 MG TABS Take by mouth. Reported on 12/03/2015     No current facility-administered medications for this visit.    OBJECTIVE:  Middle-aged African-American woman in  no acute distress Filed Vitals:   12/03/15 1522  BP: 163/84  Pulse: 102  Temp: 98.6 F (37 C)  Resp: 18    ECOG FS:1 - Symptomatic but completely ambulatory   Skin: warm, dry  HEENT: sclerae anicteric, conjunctivae pink, oropharynx clear. No thrush or mucositis.  Lymph Nodes: No cervical or supraclavicular lymphadenopathy  Lungs: clear to auscultation bilaterally, no rales, wheezes, or rhonci  Heart: regular rate and rhythm  Abdomen: round, soft, non tender, positive bowel sounds  Musculoskeletal: No focal spinal tenderness, no peripheral edema  Neuro: non focal, well oriented, positive affect  Breasts: right breast status post lumpectomy and radiation. Residual hyperpigmentation noted. No evidence of recurrent disease. Right axilla benign. Left breast unremarkable.   LAB RESULTS:  CMP  I No results found for: SPEP  Lab Results  Component Value Date   WBC 4.8 11/15/2014   NEUTROABS 3.2 11/15/2014   HGB 9.8* 11/15/2014   HCT 32.2* 11/15/2014   MCV 91.8 11/15/2014   PLT 249 11/15/2014      Chemistry      Component Value Date/Time   NA 138 11/15/2014 1309   NA 141  02/03/2014 1400   K 4.8 11/15/2014 1309   K 4.4 02/03/2014 1400   CL 100 02/03/2014 1400   CO2 21* 11/15/2014 1309   CO2 24 02/03/2014 1400   BUN 22.4 11/15/2014 1309   BUN 20 02/03/2014 1400   CREATININE 1.2* 11/15/2014 1309   CREATININE 1.16* 02/03/2014 1400   GLU 353* 04/07/2014 1113      Component Value Date/Time   CALCIUM 9.5 11/15/2014 1309   CALCIUM 10.0 02/03/2014 1400   ALKPHOS 124 11/15/2014 1309   ALKPHOS 114 02/03/2014 1400   AST 29 11/15/2014 1309   AST 15 02/03/2014 1400   ALT 46 11/15/2014 1309   ALT 15 02/03/2014 1400   BILITOT 0.26 11/15/2014 1309   BILITOT 0.3 02/03/2014 1400       Lab Results  Component Value Date   LABCA2 51* 02/03/2014    No components found for: [id number]  No results for input(s): INR in the last 168 hours.  Urinalysis No results found for: COLORURINE  STUDIES: No results found.  EXAM: DIGITAL DIAGNOSTIC BILATERAL MAMMOGRAM WITH 3D TOMOSYNTHESIS AND CAD  COMPARISON: 02/08/2014, 02/02/2014, additional prior studies dating back to 07/31/2008  ACR Breast Density Category c: The breast tissue is heterogeneously dense, which may obscure small masses.  FINDINGS: New postlumpectomy and postradiation changes are noted of the right breast. No suspicious mass, calcifications, or other abnormality is identified within either breast.  Mammographic images were processed with CAD.  IMPRESSION: No mammographic evidence of malignancy.  RECOMMENDATION: Bilateral diagnostic mammogram in 1 year.  I have discussed the findings and recommendations with the patient. Results were also provided in writing at the conclusion of the visit. If applicable, a reminder letter will be sent to the patient regarding the next appointment.  BI-RADS CATEGORY 2: Benign.   Electronically Signed  By: Pamelia Hoit M.D.  On: 12/13/2014 09:35  ASSESSMENT: 60 y.o. BRCA negative [name] woman status post right breast biopsy 12/29/2013  for a clinical T2 N1, stage IIB invasive ductal carcinoma, grade 3, triple negative, with an MIB-1 of 80%.  (1) status post right lumpectomy and sentinel lymph node sampling 02/08/2014 for a pT2 pN0, stage IIA invasive ductal carcinoma, grade 3, repeat prognostic panel again triple negative  (2) adjuvant chemotherapy with doxorubicin and cyclophosphamide in dose dense fashion x4 completed 04/13/2014,  followed  by carboplatin and Abraxane weekly x7 completed 06/30/2014  (a) additional 5 planned cycles cancelled because of neuropathy  (3) adjuvant radiation completed 10/11/2014  1) Right breast /50.4Gy in 28 Fractions  2) Right breast boost / 10 Gy in 5 fractions  (4) status post remote TAH BSO   (5) Status post removal of a pituitary adenoma 09/05/2005, no malignancy identified  (6) moderately controlled hypertension  (7) Anemia - last requiring blood transfusion on 06/24/14  PLAN: [name] looks amazing. She is in great spirits and is taking great care of herself. She is due for a repeat mammogram in April. So I have placed orders for this to be performed at the [facility].   We discussed her blood pressure. She says that her PCP is aware and was previously on the fence about increasing her antihypertensive meds. She returns to visit him in a few weeks and they will reopen the topic.  [name] will return in 6 months for follow up. She understands and agrees with this plan. She has been encouraged to call with any issues that might arise before her next visit here.    Laurie Panda, NP 12/03/2015 3:59 PM

## 2015-12-14 ENCOUNTER — Ambulatory Visit
Admission: RE | Admit: 2015-12-14 | Discharge: 2015-12-14 | Disposition: A | Discharge status: Home / Self Care | Payer: BLUE CROSS/BLUE SHIELD | Source: Ambulatory Visit | Attending: Nurse Practitioner | Admitting: Nurse Practitioner

## 2015-12-14 DIAGNOSIS — C50511 Malignant neoplasm of lower-outer quadrant of right female breast: Secondary | ICD-10-CM

## 2016-01-15 MED FILL — GABAPENTIN 100 MG CAPSULE: 100 | 30 days supply | Qty: 90 | Fill #5

## 2016-01-15 MED FILL — GABAPENTIN 300 MG CAPSULE: 300 | 30 days supply | Qty: 30 | Fill #5

## 2016-02-28 MED FILL — GABAPENTIN 300 MG CAPSULE: 300 | 30 days supply | Qty: 30 | Fill #6

## 2016-02-28 MED FILL — GABAPENTIN 100 MG CAPSULE: 100 | 30 days supply | Qty: 90 | Fill #6

## 2016-04-03 ENCOUNTER — Other Ambulatory Visit: Payer: Self-pay | Admitting: Adult Health

## 2016-04-03 DIAGNOSIS — C50511 Malignant neoplasm of lower-outer quadrant of right female breast: Secondary | ICD-10-CM

## 2016-04-03 DIAGNOSIS — G6289 Other specified polyneuropathies: Secondary | ICD-10-CM

## 2016-04-03 MED FILL — GABAPENTIN 300 MG CAPSULE: 300 | 30 days supply | Qty: 30 | Fill #0

## 2016-04-03 MED FILL — GABAPENTIN 100 MG CAPSULE: 100 | 30 days supply | Qty: 90 | Fill #0

## 2016-05-09 MED FILL — GABAPENTIN 100 MG CAPSULE: 100 | 30 days supply | Qty: 90 | Fill #1

## 2016-05-09 MED FILL — GABAPENTIN 300 MG CAPSULE: 300 | 30 days supply | Qty: 30 | Fill #1

## 2016-05-26 ENCOUNTER — Telehealth: Payer: Self-pay | Admitting: Oncology

## 2016-05-26 NOTE — Telephone Encounter (Signed)
06/02/2016 Appointment rescheduled to 06/09/2016 per patient request.

## 2016-06-02 ENCOUNTER — Other Ambulatory Visit: Payer: BLUE CROSS/BLUE SHIELD

## 2016-06-02 ENCOUNTER — Ambulatory Visit: Payer: BLUE CROSS/BLUE SHIELD | Admitting: Oncology

## 2016-06-05 ENCOUNTER — Other Ambulatory Visit: Payer: Self-pay | Admitting: *Deleted

## 2016-06-05 DIAGNOSIS — C50511 Malignant neoplasm of lower-outer quadrant of right female breast: Secondary | ICD-10-CM

## 2016-06-09 ENCOUNTER — Other Ambulatory Visit (HOSPITAL_BASED_OUTPATIENT_CLINIC_OR_DEPARTMENT_OTHER): Payer: BLUE CROSS/BLUE SHIELD

## 2016-06-09 ENCOUNTER — Ambulatory Visit (HOSPITAL_BASED_OUTPATIENT_CLINIC_OR_DEPARTMENT_OTHER): Payer: BLUE CROSS/BLUE SHIELD | Admitting: Oncology

## 2016-06-09 VITALS — BP 164/78 | HR 100 | Temp 98.4°F | Resp 18 | Ht 65.0 in | Wt 244.8 lb

## 2016-06-09 DIAGNOSIS — Z170 Estrogen receptor positive status [ER+]: Principal | ICD-10-CM

## 2016-06-09 DIAGNOSIS — Z853 Personal history of malignant neoplasm of breast: Secondary | ICD-10-CM | POA: Diagnosis not present

## 2016-06-09 DIAGNOSIS — D649 Anemia, unspecified: Secondary | ICD-10-CM

## 2016-06-09 DIAGNOSIS — Z171 Estrogen receptor negative status [ER-]: Secondary | ICD-10-CM

## 2016-06-09 DIAGNOSIS — C50511 Malignant neoplasm of lower-outer quadrant of right female breast: Secondary | ICD-10-CM | POA: Insufficient documentation

## 2016-06-09 DIAGNOSIS — E119 Type 2 diabetes mellitus without complications: Secondary | ICD-10-CM

## 2016-06-09 LAB — CBC WITH DIFFERENTIAL/PLATELET
BASO%: 0.2 % (ref 0.0–2.0)
Basophils Absolute: 0 10*3/uL (ref 0.0–0.1)
EOS%: 1.8 % (ref 0.0–7.0)
Eosinophils Absolute: 0.1 10*3/uL (ref 0.0–0.5)
HCT: 34.1 % — ABNORMAL LOW (ref 34.8–46.6)
HGB: 10.7 g/dL — ABNORMAL LOW (ref 11.6–15.9)
LYMPH%: 20 % (ref 14.0–49.7)
MCH: 27.4 pg (ref 25.1–34.0)
MCHC: 31.4 g/dL — AB (ref 31.5–36.0)
MCV: 87.2 fL (ref 79.5–101.0)
MONO#: 0.4 10*3/uL (ref 0.1–0.9)
MONO%: 5.8 % (ref 0.0–14.0)
NEUT%: 72.2 % (ref 38.4–76.8)
NEUTROS ABS: 4.5 10*3/uL (ref 1.5–6.5)
PLATELETS: 200 10*3/uL (ref 145–400)
RBC: 3.91 10*6/uL (ref 3.70–5.45)
RDW: 16.1 % — ABNORMAL HIGH (ref 11.2–14.5)
WBC: 6.3 10*3/uL (ref 3.9–10.3)
lymph#: 1.3 10*3/uL (ref 0.9–3.3)

## 2016-06-09 LAB — COMPREHENSIVE METABOLIC PANEL
ALT: 52 U/L (ref 0–55)
ANION GAP: 10 meq/L (ref 3–11)
AST: 64 U/L — ABNORMAL HIGH (ref 5–34)
Albumin: 3.7 g/dL (ref 3.5–5.0)
Alkaline Phosphatase: 114 U/L (ref 40–150)
BUN: 27.3 mg/dL — ABNORMAL HIGH (ref 7.0–26.0)
CHLORIDE: 105 meq/L (ref 98–109)
CO2: 22 meq/L (ref 22–29)
Calcium: 10 mg/dL (ref 8.4–10.4)
Creatinine: 1.4 mg/dL — ABNORMAL HIGH (ref 0.6–1.1)
EGFR: 47 mL/min/{1.73_m2} — AB (ref >90)
Glucose: 155 mg/dl — ABNORMAL HIGH (ref 70–140)
POTASSIUM: 4.5 meq/L (ref 3.5–5.1)
Sodium: 138 mEq/L (ref 136–145)
Total Bilirubin: 0.35 mg/dL (ref 0.20–1.20)
Total Protein: 8.1 g/dL (ref 6.4–8.3)

## 2016-06-09 NOTE — Progress Notes (Signed)
[name]  Telephone:(336) [phone] Fax:(336) [phone]     ID: [name] OB: [birth year]  MR#: [id number]  OEV#:[id number]  PCP: Penny Noon, MD GYN:   SU: Rolm Bookbinder OTHER MD: [name]  CHIEF COMPLAINT: Triple negative breast cancer  CURRENT TREATMENT: observation  BREAST CANCER HISTORY: From the original intake note 01/18/2014:  The patient had routine bilateral screening mammography at the breast Center for 10/28/2013 showing a possible mass in the right breast. Additional views and right breast ultrasonography 12/20/2013 confirmed an irregular 1.3 cm right breast mass at the 6:00 position. This was not palpable. Ultrasound showed an irregular hypoechoic mass in the right breast measuring 1.4 cm. There was no right axillary adenopathy identified.  On 12/29/2013 the patient underwent right breast biopsy, showing ([id number]) and invasive ductal carcinoma, grade 3, estrogen and progesterone receptor negative, HER-2 not amplified with a signals ratio of 1.35 and a copy number per cell of 2.10, and an MIB-1 of 80%.  On 01/12/2014 the patient underwent bilateral breast MRI. This showed the mass in the right breast lower outer quadrant to measure 2.2 cm maximally. There was a 2 cm well circumscribed mass in the medial right breast which has been stable since 2011 him a and a 1 cm oval mass in the upper midline of the right breast, likely an intramammary lymph node, with benign features. A right axillary lymph node with cortical thickening measured 0.9 cm. The fatty hilum was possibly effaced; note Right axilla had been benign by [place] before biopsy. In the left breast there were no findings of concern.  The patient's subsequent history is as detailed below.  INTERVAL HISTORY: [name] returns today for follow up of her triple negative breast cancer. Since her last visit here she has started on no bands bariatrics solutions program and lobe it. She is losing  weight and enjoying herself. She is motivated and is doing the [employer] again. She is planning to do some water aerobics.  REVIEW OF SYSTEMS: She feels healthy and tells me her blood sugars are improving. 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  A detailed review of systems today was otherwise stable   PAST MEDICAL HISTORY: Past Medical History:  Diagnosis Date  . Breast cancer ([place]) 12/29/13   right 6:00 o'clock, lower outer  . Diabetes mellitus   . Family history of malignant neoplasm of breast   . Glaucoma   . High cholesterol   . History of radiation therapy 08/12/14- 10/11/14   right breast /50.4 Gy/28 fx, right breast boost/ 10 Gy/ 5 fx  . HTN (hypertension)   . Wears dentures    full top-partial bottom  . Wears glasses     PAST SURGICAL HISTORY: Past Surgical History:  Procedure Laterality Date  . ABDOMINAL HYSTERECTOMY  1993   endometriosis  . BREAST BIOPSY  2000   LEFT  . BREAST LUMPECTOMY WITH AXILLARY LYMPH NODE BIOPSY Right 02/08/14   invasive ductal  . PORTACATH PLACEMENT Left 02/08/2014   Procedure: INSERTION PORT-A-CATH;  Surgeon: Rolm Bookbinder, MD;  Location: [facility];  Service: General;  Laterality: Left;  . TONSILLECTOMY    . TRANSPHENOIDAL PITUITARY RESECTION  2006    FAMILY HISTORY Family History  Problem Relation Age of Onset  . Hypertension Mother   . Diabetes type II Mother   . Breast cancer Mother     dx ~19; deceased 27  .  Hypertension Father   . Breast cancer Maternal Aunt     deceased 58  . Cancer Maternal Uncle     unk. type; deceased 42s  . Cancer Maternal Uncle     unk. type; deceased late 76s  . Uterine cancer Cousin 45    daughter of an unaffected mat aunt who is 37   the patient's father is in his early 12s. The patient's mother died at the age of 11, from complications of breast cancer which had been diagnosed "years before". The patient had  2 brothers, 2 sisters. The only other breast cancer in the family was a maternal aunt who died from breast cancer at the age of 21. (This means that 2 of 3 sisters, including the patient's mother, had breast cancer, one of them before the age of 85). There is no history of ovarian cancer in the family.  GYNECOLOGIC HISTORY:  Menarche age 52, first live birth age 36. The patient is GX P1. She underwent total abdominal hysterectomy with bilateral salpingo-oophorectomy at age 64. She took hormone replacement for one or 2 months old.  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 The patient is a widow. She lives by herself, with no pets. Her daughter [name] lives in [place], where she works in a Geneticist, molecular. The patient has no grandchildren. She is a Psychologist, forensic   ADVANCED DIRECTIVES: Not in place; advanced directives were discussed with the patient at her initial visit 01/18/2014. She intends to name her daughter as healthcare power of attorney. [name] can be reached at [phone]   HEALTH MAINTENANCE: Social History  Substance Use Topics  . Smoking status: Former Smoker    Types: Cigarettes    Quit date: 01/31/1994  . Smokeless tobacco: Not on file  . Alcohol use Yes     Comment: occ     Colonoscopy:  PAP: Status post hysterectomy  Bone density: Remote  Lipid panel:  No Known Allergies  Current Outpatient Prescriptions  Medication Sig Dispense Refill  . amLODipine (NORVASC) 10 MG tablet TAKE ONE TABLET BY MOUTH ONCE DAILY    . Atorvastatin Calcium (LIPITOR PO) Take 40 mg by mouth daily.     . Empagliflozin-Linagliptin (GLYXAMBI) 25-5 MG TABS Take by mouth. Reported on 12/03/2015    . gabapentin (NEURONTIN) 100 MG capsule TAKE 1 CAPSULE BY MOUTH 3 TIMES DAILY  AT MEALTIMES. 90 capsule 6  . gabapentin (NEURONTIN) 300 MG capsule TAKE 1 CAPSULE BY MOUTH AT BEDTIME. 30 capsule 6  . glucose monitoring kit (FREESTYLE) monitoring kit One Touch Ultra machine and strips, patient tests her blood sugars bid    . glyBURIDE (DIABETA) 5 MG tablet Take 5 mg by mouth daily with breakfast.     . lisinopril-hydrochlorothiazide (PRINZIDE,ZESTORETIC) 20-12.5 MG per tablet Take 2 tablets by mouth daily.     . metFORMIN (GLUCOPHAGE-XR) 500 MG 24 hr tablet Take 500 mg by mouth 2 (two) times daily.     . ONE TOUCH ULTRA TEST test strip      No current facility-administered medications for this visit.     OBJECTIVE:  Middle-aged African-American womanWho appears well  Vitals:   06/09/16 1357  BP: (!) 164/78  Pulse: 100  Resp: 18  Temp: 98.4 F (36.9 C)  ECOG FS:0 - Asymptomatic   Sclerae unicteric, pupils round and equal Oropharynx clear and moist-- no thrush or other lesions No cervical or supraclavicular adenopathy Lungs no rales or rhonchi Heart regular rate and rhythm Abd soft, obese, nontender, positive bowel sounds MSK no focal spinal tenderness, no upper extremity lymphedema Neuro: nonfocal, well oriented, appropriate affect Breasts: The right breast is status post lumpectomy followed by radiation. There is no evidence of local recurrence. The right axilla is benign. The left breast is unremarkable.  LAB RESULTS:  CMP  I No results found for: SPEP  Lab Results  Component Value Date   WBC 6.3 06/09/2016   NEUTROABS 4.5 06/09/2016   HGB 10.7 (L) 06/09/2016   HCT 34.1 (L) 06/09/2016   MCV 87.2 06/09/2016   PLT 200 06/09/2016      Chemistry      Component Value Date/Time   NA 138 11/15/2014 1309   K 4.8 11/15/2014 1309   CL 100 02/03/2014 1400   CO2 21 (L) 11/15/2014 1309   BUN 22.4 11/15/2014 1309   CREATININE 1.2 (H) 11/15/2014 1309   GLU 353 (H) 04/07/2014 1113      Component Value Date/Time   CALCIUM 9.5 11/15/2014 1309    ALKPHOS 124 11/15/2014 1309   AST 29 11/15/2014 1309   ALT 46 11/15/2014 1309   BILITOT 0.26 11/15/2014 1309       Lab Results  Component Value Date   LABCA2 51 (H) 02/03/2014    No components found for: [id number]  No results for input(s): INR in the last 168 hours.  Urinalysis No results found for: COLORURINE  STUDIES: CLINICAL DATA:  History of right breast cancer in 2015 status post breast conservation therapy.  EXAM: 2D DIGITAL DIAGNOSTIC BILATERAL MAMMOGRAM WITH CAD AND ADJUNCT TOMO  COMPARISON:  Previous exam(s).  ACR Breast Density Category c: The breast tissue is heterogeneously dense, which may obscure small masses.  FINDINGS: There are stable postsurgical changes within the right breast. There are no new dominant masses, suspicious calcifications or secondary signs of malignancy within either breast.  Mammographic images were processed with CAD.  IMPRESSION: No evidence of malignancy within either breast. Postsurgical changes within the right breast.  RECOMMENDATION: Bilateral diagnostic mammogram in 1 year.  I have discussed the findings and recommendations with the patient. Results were also provided in writing at the conclusion of the visit. If applicable, a reminder letter will be sent to the patient regarding the next appointment.  BI-RADS CATEGORY  2: Benign.   Electronically Signed   By: Franki Cabot M.D.   On: 12/14/2015 10:02   ASSESSMENT: 60 y.o. BRCA negative [place] woman status post right breast lower outer quadrant  biopsy 12/29/2013 for a clinical T2 N1, stage IIB invasive ductal carcinoma, grade 3, triple negative, with an MIB-1 of 80%.  (1) status post right lumpectomy and sentinel lymph node sampling 02/08/2014 for a pT2 pN0, stage IIA invasive ductal carcinoma, grade 3, repeat prognostic panel again triple negative  (2) adjuvant chemotherapy with doxorubicin and cyclophosphamide in dose dense fashion x4  completed 04/13/2014,  followed by carboplatin and Abraxane weekly x7 completed 06/30/2014  (a) additional 5 planned cycles cancelled because of neuropathy  (3) adjuvant radiation completed 10/11/2014  1) Right breast /50.4Gy in 28 Fractions  2) Right breast boost / 10 Gy in 5 fractions  (4) status post remote TAH BSO   (5) Status post removal of a pituitary adenoma 09/05/2005, no malignancy identified  (6) moderately controlled  hypertension  (7) Anemia - last requiring blood transfusion on 06/24/14  PLAN: Kseniya Is now 2 years out from her definitive surgery with no evidence of disease recurrence. This is very favorable.  She is very encouraged by losing weight and hopes to get her diabetes under better control and perhaps, some medication. His is and achievable goal, which usually takes several years to accomplish. She is very motivated.  From the breast cancer point of view I will start seeing her on a once a year basis, in May, after April mammogram.  She knows to call for any problems that may develop before her next visit.   Chauncey Cruel, MD 06/09/2016 2:23 PM

## 2016-06-13 MED FILL — GABAPENTIN 300 MG CAPSULE: 300 | 30 days supply | Qty: 30 | Fill #2

## 2016-06-13 MED FILL — GABAPENTIN 100 MG CAPSULE: 100 | 30 days supply | Qty: 90 | Fill #2

## 2016-07-02 ENCOUNTER — Encounter: Payer: Self-pay | Admitting: Oncology

## 2016-07-16 ENCOUNTER — Other Ambulatory Visit: Payer: Self-pay | Admitting: *Deleted

## 2016-07-16 MED ORDER — LORATADINE 10 MG PO TABS: 10.0000 mg | ORAL_TABLET | Freq: Every day | ORAL | 1 refills | Status: DC

## 2016-07-24 MED FILL — GABAPENTIN 300 MG CAPSULE: 300 | 30 days supply | Qty: 30 | Fill #3

## 2016-07-24 MED FILL — GABAPENTIN 100 MG CAPSULE: 100 | 30 days supply | Qty: 90 | Fill #3

## 2016-09-25 MED FILL — GABAPENTIN 100 MG CAPSULE: 100 | 30 days supply | Qty: 90 | Fill #4

## 2016-09-25 MED FILL — GABAPENTIN 300 MG CAPSULE: 300 | 30 days supply | Qty: 30 | Fill #4

## 2016-10-24 MED FILL — GABAPENTIN 100 MG CAPSULE: 100 | 30 days supply | Qty: 90 | Fill #5

## 2016-10-24 MED FILL — GABAPENTIN 300 MG CAPSULE: 300 | 30 days supply | Qty: 30 | Fill #5

## 2016-11-20 ENCOUNTER — Other Ambulatory Visit: Payer: Self-pay | Admitting: Family Medicine

## 2016-11-20 DIAGNOSIS — Z853 Personal history of malignant neoplasm of breast: Secondary | ICD-10-CM

## 2016-12-11 MED FILL — GABAPENTIN 100 MG CAPSULE: 100 | 30 days supply | Qty: 90 | Fill #6

## 2016-12-11 MED FILL — GABAPENTIN 300 MG CAPSULE: 300 | 30 days supply | Qty: 30 | Fill #6

## 2016-12-17 ENCOUNTER — Ambulatory Visit
Admission: RE | Admit: 2016-12-17 | Discharge: 2016-12-17 | Disposition: A | Discharge status: Home / Self Care | Payer: BLUE CROSS/BLUE SHIELD | Source: Ambulatory Visit | Attending: Family Medicine | Admitting: Family Medicine

## 2016-12-17 DIAGNOSIS — Z853 Personal history of malignant neoplasm of breast: Secondary | ICD-10-CM

## 2016-12-30 ENCOUNTER — Other Ambulatory Visit: Payer: Self-pay

## 2016-12-30 DIAGNOSIS — Z171 Estrogen receptor negative status [ER-]: Principal | ICD-10-CM

## 2016-12-30 DIAGNOSIS — C50511 Malignant neoplasm of lower-outer quadrant of right female breast: Secondary | ICD-10-CM

## 2016-12-31 ENCOUNTER — Other Ambulatory Visit: Payer: BLUE CROSS/BLUE SHIELD

## 2017-01-07 ENCOUNTER — Ambulatory Visit (HOSPITAL_BASED_OUTPATIENT_CLINIC_OR_DEPARTMENT_OTHER): Payer: BLUE CROSS/BLUE SHIELD | Admitting: Oncology

## 2017-01-07 ENCOUNTER — Other Ambulatory Visit (HOSPITAL_BASED_OUTPATIENT_CLINIC_OR_DEPARTMENT_OTHER): Payer: BLUE CROSS/BLUE SHIELD

## 2017-01-07 VITALS — BP 183/94 | HR 105 | Temp 98.0°F | Resp 18 | Ht 65.0 in | Wt 245.2 lb

## 2017-01-07 DIAGNOSIS — Z171 Estrogen receptor negative status [ER-]: Principal | ICD-10-CM

## 2017-01-07 DIAGNOSIS — Z853 Personal history of malignant neoplasm of breast: Secondary | ICD-10-CM | POA: Diagnosis not present

## 2017-01-07 DIAGNOSIS — C50511 Malignant neoplasm of lower-outer quadrant of right female breast: Secondary | ICD-10-CM

## 2017-01-07 DIAGNOSIS — D649 Anemia, unspecified: Secondary | ICD-10-CM | POA: Diagnosis not present

## 2017-01-07 LAB — CBC WITH DIFFERENTIAL/PLATELET
BASO%: 0.3 % (ref 0.0–2.0)
Basophils Absolute: 0 10*3/uL (ref 0.0–0.1)
EOS ABS: 0.1 10*3/uL (ref 0.0–0.5)
EOS%: 2 % (ref 0.0–7.0)
HEMATOCRIT: 33.7 % — AB (ref 34.8–46.6)
HEMOGLOBIN: 10.7 g/dL — AB (ref 11.6–15.9)
LYMPH%: 23.5 % (ref 14.0–49.7)
MCH: 27.4 pg (ref 25.1–34.0)
MCHC: 31.8 g/dL (ref 31.5–36.0)
MCV: 86.2 fL (ref 79.5–101.0)
MONO#: 0.3 10*3/uL (ref 0.1–0.9)
MONO%: 4.7 % (ref 0.0–14.0)
NEUT%: 69.5 % (ref 38.4–76.8)
NEUTROS ABS: 4.3 10*3/uL (ref 1.5–6.5)
Platelets: 219 10*3/uL (ref 145–400)
RBC: 3.91 10*6/uL (ref 3.70–5.45)
RDW: 16.1 % — AB (ref 11.2–14.5)
WBC: 6.1 10*3/uL (ref 3.9–10.3)
lymph#: 1.4 10*3/uL (ref 0.9–3.3)

## 2017-01-07 NOTE — Progress Notes (Signed)
[name]  Telephone:(336) [phone] Fax:(336) [phone]     ID: [name] OB: 04-21-1956  MR#: [id number]  HGD#:[id number]  PCP: Chesley Noon, MD GYN:   SU: Rolm Bookbinder OTHER MD: Eppie Gibson  CHIEF COMPLAINT: Triple negative breast cancer  CURRENT TREATMENT: observation  BREAST CANCER HISTORY: From the original intake note 01/18/2014:  The patient had routine bilateral screening mammography at the breast Center for 10/28/2013 showing a possible mass in the right breast. Additional views and right breast ultrasonography 12/20/2013 confirmed an irregular 1.3 cm right breast mass at the 6:00 position. This was not palpable. Ultrasound showed an irregular hypoechoic mass in the right breast measuring 1.4 cm. There was no right axillary adenopathy identified.  On 12/29/2013 the patient underwent right breast biopsy, showing ([id number]) and invasive ductal carcinoma, grade 3, estrogen and progesterone receptor negative, HER-2 not amplified with a signals ratio of 1.35 and a copy number per cell of 2.10, and an MIB-1 of 80%.  On 01/12/2014 the patient underwent bilateral breast MRI. This showed the mass in the right breast lower outer quadrant to measure 2.2 cm maximally. There was a 2 cm well circumscribed mass in the medial right breast which has been stable since 2011 him a and a 1 cm oval mass in the upper midline of the right breast, likely an intramammary lymph node, with benign features. A right axillary lymph node with cortical thickening measured 0.9 cm. The fatty hilum was possibly effaced; note Right axilla had been benign by [place] before biopsy. In the left breast there were no findings of concern.  The patient's subsequent history is as detailed below.  INTERVAL HISTORY: [name] returns today for follow-up of her triple negative breast cancer. The interval history is generally unremarkable except that she is running very high hemoglobin A1c's. She  tells me she celebrated her birthday for 3 months in a row. She tells me Dr. badger has already threatening her with multiple renal and heart catastrophe's if she doesn't get her sugars under control and she promises she will start a walking program. She is also singing in her choir which she says takes up a lot of energy.   REVIEW OF SYSTEMS: "I feel fine". A detailed review of systems today was noncontributory   PAST MEDICAL HISTORY: Past Medical History:  Diagnosis Date  . Breast cancer ([place]) 12/29/13   right 6:00 o'clock, lower outer  . Diabetes mellitus   . Family history of malignant neoplasm of breast   . Glaucoma   . High cholesterol   . History of radiation therapy 08/12/14- 10/11/14   right breast /50.4 Gy/28 fx, right breast boost/ 10 Gy/ 5 fx  . HTN (hypertension)   . Wears dentures    full top-partial bottom  . Wears glasses     PAST SURGICAL HISTORY: Past Surgical History:  Procedure Laterality Date  . ABDOMINAL HYSTERECTOMY  1993   endometriosis  . BREAST BIOPSY Left 12/29/2013  . BREAST LUMPECTOMY Right 02/08/2014  . BREAST LUMPECTOMY WITH AXILLARY LYMPH NODE BIOPSY Right 02/08/14   invasive ductal  . PORTACATH PLACEMENT Left 02/08/2014   Procedure: INSERTION PORT-A-CATH;  Surgeon: Rolm Bookbinder, MD;  Location: [facility];  Service: General;  Laterality: Left;  . TONSILLECTOMY    . TRANSPHENOIDAL PITUITARY RESECTION  2006    FAMILY HISTORY Family History  Problem Relation Age of Onset  . Hypertension Mother   . Diabetes type II Mother   . Breast cancer Mother  dx ~72; deceased 25  . Hypertension Father   . Breast cancer Maternal Aunt     deceased 43  . Cancer Maternal Uncle     unk. type; deceased 78s  . Cancer Maternal Uncle     unk. type; deceased late 38s  . Uterine cancer Cousin 39    daughter of an unaffected mat aunt who is 31   the patient's father is in his early 25s. The patient's mother died at the age of 33, from  complications of breast cancer which had been diagnosed "years before". The patient had 2 brothers, 2 sisters. The only other breast cancer in the family was a maternal aunt who died from breast cancer at the age of 70. (This means that 2 of 3 sisters, including the patient's mother, had breast cancer, one of them before the age of 4). There is no history of ovarian cancer in the family.  GYNECOLOGIC HISTORY:  Menarche age 40, first live birth age 13. The patient is GX P1. She underwent total abdominal hysterectomy with bilateral salpingo-oophorectomy at age 54. She took hormone replacement for one or 2 months old.  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 The patient is a widow. She lives by herself, with no pets. Her daughter [name] lives in [place], where she works in a Geneticist, molecular. The patient has no grandchildren. She is a Psychologist, forensic   ADVANCED DIRECTIVES: Not in place; advanced directives were discussed with the patient at her initial visit 01/18/2014. She intends to name her daughter as healthcare power of attorney. [name] can be reached at [phone]   HEALTH MAINTENANCE: Social History  Substance Use Topics  . Smoking status: Former Smoker    Types: Cigarettes    Quit date: 01/31/1994  . Smokeless tobacco: Not on file  . Alcohol use Yes     Comment: occ     Colonoscopy:  PAP: Status post hysterectomy  Bone density: Remote  Lipid panel:  No Known Allergies  Current Outpatient Prescriptions  Medication Sig Dispense Refill  . amLODipine (NORVASC) 10 MG tablet TAKE ONE TABLET BY MOUTH ONCE DAILY    . Atorvastatin Calcium (LIPITOR PO) Take 40 mg by mouth daily.     . Empagliflozin-Linagliptin (GLYXAMBI) 25-5 MG TABS Take by mouth. Reported on  12/03/2015    . gabapentin (NEURONTIN) 100 MG capsule TAKE 1 CAPSULE BY MOUTH 3 TIMES DAILY AT MEALTIMES. 90 capsule 6  . gabapentin (NEURONTIN) 300 MG capsule TAKE 1 CAPSULE BY MOUTH AT BEDTIME. 30 capsule 6  . glucose monitoring kit (FREESTYLE) monitoring kit One Touch Ultra machine and strips, patient tests her blood sugars bid    . glyBURIDE (DIABETA) 5 MG tablet Take 5 mg by mouth daily with breakfast.     . lisinopril-hydrochlorothiazide (PRINZIDE,ZESTORETIC) 20-12.5 MG per tablet Take 2 tablets by mouth daily.     Marland Kitchen loratadine (CLARITIN) 10 MG tablet Take 1 tablet (10 mg total) by mouth daily. 100 tablet 1  . metFORMIN (GLUCOPHAGE-XR) 500 MG 24 hr tablet Take 500 mg by mouth 2 (two) times daily.     . ONE TOUCH ULTRA TEST test strip      No current facility-administered medications for this visit.     OBJECTIVE:  Middle-aged African-American woman in no  acute distress  Vitals:   01/07/17 1521  BP: (!) 183/94  Pulse: (!) 105  Resp: 18  Temp: 98 F (36.7 C)    ECOG FS:0 - Asymptomatic   Sclerae unicteric, EOMs intact Oropharynx clear and moist No cervical or supraclavicular adenopathy Lungs no rales or rhonchi Heart regular rate and rhythm Abd soft, nontender, positive bowel sounds MSK no focal spinal tenderness, no upper extremity lymphedema Neuro: nonfocal, well oriented, appropriate affect Breasts: The right breast has undergone lumpectomy followed by radiation, with no evidence of recurrence. The left breast is unremarkable. Both axillae are benign.   LAB RESULTS:  CMP  I No results found for: SPEP  Lab Results  Component Value Date   WBC 6.1 01/07/2017   NEUTROABS 4.3 01/07/2017   HGB 10.7 (L) 01/07/2017   HCT 33.7 (L) 01/07/2017   MCV 86.2 01/07/2017   PLT 219 01/07/2017      Chemistry      Component Value Date/Time   NA 138 06/09/2016 1339   K 4.5 06/09/2016 1339   CL 100 02/03/2014 1400   CO2 22 06/09/2016 1339   BUN 27.3 (H) 06/09/2016 1339    CREATININE 1.4 (H) 06/09/2016 1339   GLU 353 (H) 04/07/2014 1113      Component Value Date/Time   CALCIUM 10.0 06/09/2016 1339   ALKPHOS 114 06/09/2016 1339   AST 64 (H) 06/09/2016 1339   ALT 52 06/09/2016 1339   BILITOT 0.35 06/09/2016 1339       Lab Results  Component Value Date   LABCA2 51 (H) 02/03/2014    No components found for: [id number]  No results for input(s): INR in the last 168 hours.  Urinalysis No results found for: COLORURINE  STUDIES.risr Mm Diag Breast Tomo Bilateral  Result Date: 12/17/2016 CLINICAL DATA:  61 year old female for annual bilateral mammograms. History of right breast cancer and lumpectomy in 2015. EXAM: 2D DIGITAL DIAGNOSTIC BILATERAL MAMMOGRAM WITH CAD AND ADJUNCT TOMO COMPARISON:  Previous exam(s). ACR Breast Density Category b: There are scattered areas of fibroglandular density. FINDINGS: 2D and 3D full field views of both breasts and a magnification view of the lumpectomy site demonstrates no suspicious mass, nonsurgical distortion or worrisome calcifications. Surgical and post treatment changes of the right breast again noted. Mammographic images were processed with CAD. IMPRESSION: No mammographic evidence of breast malignancy. RECOMMENDATION: Bilateral diagnostic mammograms in 1 year. I have discussed the findings and recommendations with the patient. Results were also provided in writing at the conclusion of the visit. If applicable, a reminder letter will be sent to the patient regarding the next appointment. BI-RADS CATEGORY  2: Benign. Electronically Signed   By: Margarette Canada M.D.   On: 12/17/2016 08:17     ASSESSMENT: 61 y.o. BRCA negative [place] woman status post right breast lower outer quadrant  biopsy 12/29/2013 for a clinical T2 N1, stage IIB invasive ductal carcinoma, grade 3, triple negative, with an MIB-1 of 80%.  (1) status post right lumpectomy and sentinel lymph node sampling 02/08/2014 for a pT2 pN0, stage IIA invasive  ductal carcinoma, grade 3, repeat prognostic panel again triple negative  (2) adjuvant chemotherapy with doxorubicin and cyclophosphamide in dose dense fashion x4 completed 04/13/2014,  followed by carboplatin and Abraxane weekly x7 completed 06/30/2014  (a) additional 5 planned cycles cancelled because of neuropathy  (3) adjuvant radiation completed 10/11/2014  1) Right breast /50.4Gy in 28 Fractions  2) Right breast boost / 10 Gy in 5 fractions  (4) status  post remote TAH BSO   (5) Status post removal of a pituitary adenoma 09/05/2005, no malignancy identified  (6) moderately controlled hypertension  (7) Anemia - last requiring blood transfusion on 06/24/14  PLAN: Keyara is now just about 3 years out from definitive surgery for her breast cancer with no evidence of disease recurrence. This is very favorable.  We reviewed the fact that estrogen receptor negative tumors if you're going to recurrent enter curly. Accordingly the fact that it has not yet recurred is particularly favorable in her case.  I underlined Dr. Fayrene Fearing warnings. Aleshka is putting yourself at risk of kidney failure stroke and heart attack. She would feel better and do better if she started a regular exercise program and controlled her diet.  From a cancer point of view however she is doing fine. She will see me again in one year. She knows to call for any problems that may develop before her next visit here.   Chauncey Cruel, MD 01/07/2017 8:23 PM

## 2017-01-12 ENCOUNTER — Other Ambulatory Visit: Payer: Self-pay | Admitting: Adult Health

## 2017-01-12 DIAGNOSIS — C50511 Malignant neoplasm of lower-outer quadrant of right female breast: Secondary | ICD-10-CM

## 2017-01-12 DIAGNOSIS — G6289 Other specified polyneuropathies: Secondary | ICD-10-CM

## 2017-01-12 MED FILL — GABAPENTIN 100 MG CAPSULE: 100 | 30 days supply | Qty: 90 | Fill #0

## 2017-01-12 MED FILL — GABAPENTIN 300 MG CAPSULE: 300 | 30 days supply | Qty: 30 | Fill #0

## 2017-02-09 MED FILL — GABAPENTIN 300 MG CAPSULE: 300 | 30 days supply | Qty: 30 | Fill #1 | Status: TO

## 2017-02-09 MED FILL — GABAPENTIN 100 MG CAPSULE: 100 | 30 days supply | Qty: 90 | Fill #1 | Status: TO

## 2017-04-09 DIAGNOSIS — H2513 Age-related nuclear cataract, bilateral: Secondary | ICD-10-CM | POA: Diagnosis not present

## 2017-04-09 DIAGNOSIS — H401123 Primary open-angle glaucoma, left eye, severe stage: Secondary | ICD-10-CM | POA: Diagnosis not present

## 2017-04-09 DIAGNOSIS — H401112 Primary open-angle glaucoma, right eye, moderate stage: Secondary | ICD-10-CM | POA: Diagnosis not present

## 2017-11-05 ENCOUNTER — Other Ambulatory Visit: Payer: Self-pay | Admitting: Oncology

## 2017-11-05 ENCOUNTER — Encounter: Payer: Self-pay | Admitting: Oncology

## 2017-11-05 DIAGNOSIS — G6289 Other specified polyneuropathies: Secondary | ICD-10-CM

## 2017-11-05 DIAGNOSIS — C50511 Malignant neoplasm of lower-outer quadrant of right female breast: Secondary | ICD-10-CM

## 2017-11-18 ENCOUNTER — Other Ambulatory Visit: Payer: Self-pay | Admitting: Family Medicine

## 2017-11-18 DIAGNOSIS — Z139 Encounter for screening, unspecified: Secondary | ICD-10-CM

## 2017-11-18 DIAGNOSIS — Z853 Personal history of malignant neoplasm of breast: Secondary | ICD-10-CM

## 2017-12-10 ENCOUNTER — Encounter: Payer: Self-pay | Admitting: Oncology

## 2017-12-18 ENCOUNTER — Ambulatory Visit
Admission: RE | Admit: 2017-12-18 | Discharge: 2017-12-18 | Disposition: A | Discharge status: Home / Self Care | Payer: BLUE CROSS/BLUE SHIELD | Source: Ambulatory Visit | Attending: Family Medicine | Admitting: Family Medicine

## 2017-12-18 DIAGNOSIS — Z853 Personal history of malignant neoplasm of breast: Secondary | ICD-10-CM

## 2017-12-18 DIAGNOSIS — R928 Other abnormal and inconclusive findings on diagnostic imaging of breast: Secondary | ICD-10-CM | POA: Diagnosis not present

## 2017-12-18 HISTORY — DX: Personal history of irradiation: Z92.3

## 2017-12-18 HISTORY — DX: Personal history of antineoplastic chemotherapy: Z92.21

## 2017-12-25 ENCOUNTER — Telehealth: Payer: Self-pay | Admitting: Oncology

## 2017-12-25 NOTE — Telephone Encounter (Signed)
Left vm for pt re appts that were moved - sending calendar in mail.

## 2018-01-14 ENCOUNTER — Ambulatory Visit: Payer: BLUE CROSS/BLUE SHIELD | Admitting: Oncology

## 2018-01-14 ENCOUNTER — Other Ambulatory Visit: Payer: BLUE CROSS/BLUE SHIELD

## 2018-02-03 ENCOUNTER — Telehealth: Payer: Self-pay | Admitting: Oncology

## 2018-02-03 NOTE — Telephone Encounter (Signed)
Patient called to reschedule  °

## 2018-02-05 ENCOUNTER — Inpatient Hospital Stay: Payer: BLUE CROSS/BLUE SHIELD

## 2018-02-05 ENCOUNTER — Inpatient Hospital Stay: Payer: BLUE CROSS/BLUE SHIELD | Admitting: Oncology

## 2018-03-30 ENCOUNTER — Inpatient Hospital Stay: Payer: BLUE CROSS/BLUE SHIELD | Admitting: Oncology

## 2018-03-30 ENCOUNTER — Inpatient Hospital Stay: Payer: BLUE CROSS/BLUE SHIELD

## 2018-04-16 ENCOUNTER — Other Ambulatory Visit: Payer: Self-pay

## 2018-04-16 DIAGNOSIS — Z803 Family history of malignant neoplasm of breast: Secondary | ICD-10-CM

## 2018-04-19 ENCOUNTER — Inpatient Hospital Stay: Payer: BLUE CROSS/BLUE SHIELD | Admitting: Adult Health

## 2018-04-19 ENCOUNTER — Inpatient Hospital Stay: Payer: BLUE CROSS/BLUE SHIELD

## 2018-04-29 ENCOUNTER — Other Ambulatory Visit: Payer: Self-pay | Admitting: Oncology

## 2018-04-29 DIAGNOSIS — C50511 Malignant neoplasm of lower-outer quadrant of right female breast: Secondary | ICD-10-CM

## 2018-04-29 DIAGNOSIS — G6289 Other specified polyneuropathies: Secondary | ICD-10-CM

## 2018-05-12 DIAGNOSIS — E1165 Type 2 diabetes mellitus with hyperglycemia: Secondary | ICD-10-CM | POA: Diagnosis not present

## 2018-05-12 DIAGNOSIS — I10 Essential (primary) hypertension: Secondary | ICD-10-CM | POA: Diagnosis not present

## 2018-05-12 DIAGNOSIS — E785 Hyperlipidemia, unspecified: Secondary | ICD-10-CM | POA: Diagnosis not present

## 2018-05-12 DIAGNOSIS — E559 Vitamin D deficiency, unspecified: Secondary | ICD-10-CM | POA: Diagnosis not present

## 2018-05-12 DIAGNOSIS — Z0000 Encounter for general adult medical examination without abnormal findings: Secondary | ICD-10-CM | POA: Diagnosis not present

## 2018-06-08 NOTE — Progress Notes (Signed)
[facility]  Telephone:([phone] Fax:([phone]) [phone]     ID: [name] OB: 1956-02-15  MR#: [id number]  JYN#:[id number]  PCP: Eartha Inch, MD GYN:   SU: Emelia Loron OTHER MD: Lonie Peak  CHIEF COMPLAINT: Triple negative breast cancer  CURRENT TREATMENT: observation  BREAST CANCER HISTORY: From the original intake note 01/18/2014:  The patient had routine bilateral screening mammography at the breast Center for 10/28/2013 showing a possible mass in the right breast. Additional views and right breast ultrasonography 12/20/2013 confirmed an irregular 1.3 cm right breast mass at the 6:00 position. This was not palpable. Ultrasound showed an irregular hypoechoic mass in the right breast measuring 1.4 cm. There was no right axillary adenopathy identified.  On 12/29/2013 the patient underwent right breast biopsy, showing ([id number]) and invasive ductal carcinoma, grade 3, estrogen and progesterone receptor negative, HER-2 not amplified with a signals ratio of 1.35 and a copy number per cell of 2.10, and an MIB-1 of 80%.  On 01/12/2014 the patient underwent bilateral breast MRI. This showed the mass in the right breast lower outer quadrant to measure 2.2 cm maximally. There was a 2 cm well circumscribed mass in the medial right breast which has been stable since 2011 him a and a 1 cm oval mass in the upper midline of the right breast, likely an intramammary lymph node, with benign features. A right axillary lymph node with cortical thickening measured 0.9 cm. The fatty hilum was possibly effaced; note Right axilla had been benign by [place] before biopsy. In the left breast there were no findings of concern.  The patient's subsequent history is as detailed below.  INTERVAL HISTORY: [name] returns today for follow-up of her triple negative breast cancer. She continues under observation. The interval history is generally unremarkable. She denies any changes in  her breasts.  Since her last visit, she underwent diagnostic bilateral mammography with CAD and tomography on 12[facility] showing: breast density category B. There was no evidence of malignancy.    REVIEW OF SYSTEMS: [name] reports that she is staying busy with working. Her daughter participated in [place] [employer] week as a model. [name] walks in the mornings at work everyday for 20 minutes. She continues to manager her sugar levels, which have improved. She denies unusual headaches, visual changes, nausea, vomiting, or dizziness. There has been no unusual cough, phlegm production, or pleurisy. There has been no change in bowel or bladder habits. She denies unexplained fatigue or unexplained weight loss, bleeding, rash, or fever. A detailed review of systems was otherwise stable.    PAST MEDICAL HISTORY: Past Medical History:  Diagnosis Date  . Breast cancer (HCC) 12/29/13   right 6:00 o'clock, lower outer  . Diabetes mellitus   . Family history of malignant neoplasm of breast   . Glaucoma   . High cholesterol   . History of radiation therapy 08/12/14- 10/11/14   right breast /50.4 Gy/28 fx, right breast boost/ 10 Gy/ 5 fx  . HTN (hypertension)   . Personal history of chemotherapy   . Personal history of radiation therapy   . Wears dentures    full top-partial bottom  . Wears glasses     PAST SURGICAL HISTORY: Past Surgical History:  Procedure Laterality Date  . ABDOMINAL HYSTERECTOMY  1993   endometriosis  . BREAST BIOPSY Left 12/29/2013  . BREAST LUMPECTOMY Right 02/08/2014  . BREAST LUMPECTOMY WITH AXILLARY LYMPH NODE BIOPSY Right 02/08/14   invasive ductal  .  PORTACATH PLACEMENT Left 02/08/2014   Procedure: INSERTION PORT-A-CATH;  Surgeon: Emelia Loron, MD;  Location: [facility];  Service: General;  Laterality: Left;  . TONSILLECTOMY    . TRANSPHENOIDAL PITUITARY RESECTION  2006    FAMILY HISTORY Family History  Problem Relation Age  of Onset  . Hypertension Mother   . Diabetes type II Mother   . Breast cancer Mother        dx ~67; deceased 53  . Hypertension Father   . Breast cancer Maternal Aunt        deceased 34  . Cancer Maternal Uncle        unk. type; deceased 49s  . Cancer Maternal Uncle        unk. type; deceased late 68s  . Uterine cancer Cousin 39       daughter of an unaffected mat aunt who is 18   the patient's father is in his early 72s. The patient's mother died at the age of 79, from complications of breast cancer which had been diagnosed "years before". The patient had 2 brothers, 2 sisters. The only other breast cancer in the family was a maternal aunt who died from breast cancer at the age of 91. (This means that 2 of 3 sisters, including the patient's mother, had breast cancer, one of them before the age of 17). There is no history of ovarian cancer in the family.  GYNECOLOGIC HISTORY:  Menarche age 52, first live birth age 46. The patient is GX P1. She underwent total abdominal hysterectomy with bilateral salpingo-oophorectomy at age 62. She took hormone replacement for one or 2 months old.  SOCIAL HISTORY:  The patient works as a Retail banker. Her work is largely sedentary. 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 The patient is a widow. She lives by herself, with no pets. Her daughter [name] lives in [place], where she works in a Fish farm manager and model. The patient has no grandchildren. She is a Control and instrumentation engineer   ADVANCED DIRECTIVES: Not in place; advanced directives were discussed with the patient at her initial visit 01/18/2014. She intends to name her daughter as healthcare power of attorney. [name] can be reached at [phone]   HEALTH MAINTENANCE: Social History   Tobacco Use  . Smoking status: Former Smoker    Types: Cigarettes    Last attempt  to quit: 01/31/1994    Years since quitting: 24.3  Substance Use Topics  . Alcohol use: Yes    Comment: occ  . Drug use: No     Colonoscopy:  PAP: Status post hysterectomy  Bone density: Remote  Lipid panel:  No Known Allergies  Current Outpatient Medications  Medication Sig Dispense Refill  . amLODipine (NORVASC) 10 MG tablet TAKE ONE TABLET BY MOUTH ONCE DAILY    . Atorvastatin Calcium (LIPITOR PO) Take 40 mg by mouth daily.     . Empagliflozin-Linagliptin (GLYXAMBI) 25-5 MG TABS Take by mouth. Reported on 12/03/2015    . gabapentin (NEURONTIN) 100 MG capsule TAKE 1 CAPSULE BY MOUTH THREE TIMES DAILY AT  MEALTIME 90 capsule 4  . gabapentin (NEURONTIN) 300 MG capsule TAKE 1 CAPSULE BY MOUTH AT BEDTIME 30 capsule 4  . glucose monitoring kit (FREESTYLE) monitoring kit One Touch Ultra machine and strips, patient tests her blood sugars bid    . glyBURIDE (DIABETA) 5 MG  tablet Take 5 mg by mouth daily with breakfast.     . lisinopril-hydrochlorothiazide (PRINZIDE,ZESTORETIC) 20-12.5 MG per tablet Take 2 tablets by mouth daily.     Marland Kitchen loratadine (CLARITIN) 10 MG tablet Take 1 tablet (10 mg total) by mouth daily. 100 tablet 1  . metFORMIN (GLUCOPHAGE-XR) 500 MG 24 hr tablet Take 500 mg by mouth 2 (two) times daily.     . ONE TOUCH ULTRA TEST test strip      No current facility-administered medications for this visit.     OBJECTIVE:  Middle-aged African-American woman who appears well  Vitals:   06/09/18 0923  BP: (!) 153/80  Pulse: 93  Resp: 18  Temp: 98.6 F (37 C)  SpO2: 100%    ECOG FS:0 - Asymptomatic   Sclerae unicteric, pupils round and equal Oropharynx clear and moist No cervical or supraclavicular adenopathy Lungs no rales or rhonchi Heart regular rate and rhythm Abd soft, nontender, positive bowel sounds MSK no focal spinal tenderness, no upper extremity lymphedema Neuro: nonfocal, well oriented, appropriate affect Breasts: Right breast is status post mastectomy  and radiation; the left breast is benign.  Both axillae are benign.  LAB RESULTS:  CMP  I No results found for: SPEP  Lab Results  Component Value Date   WBC 5.6 06/09/2018   NEUTROABS 3.6 06/09/2018   HGB 10.2 (L) 06/09/2018   HCT 31.7 (L) 06/09/2018   MCV 86.4 06/09/2018   PLT 218 06/09/2018      Chemistry      Component Value Date/Time   NA 138 06/09/2016 1339   K 4.5 06/09/2016 1339   CL 100 02/03/2014 1400   CO2 22 06/09/2016 1339   BUN 27.3 (H) 06/09/2016 1339   CREATININE 1.4 (H) 06/09/2016 1339   GLU 353 (H) 04/07/2014 1113      Component Value Date/Time   CALCIUM 10.0 06/09/2016 1339   ALKPHOS 114 06/09/2016 1339   AST 64 (H) 06/09/2016 1339   ALT 52 06/09/2016 1339   BILITOT 0.35 06/09/2016 1339       Lab Results  Component Value Date   LABCA2 51 (H) 02/03/2014    No components found for: [id number]  No results for input(s): INR in the last 168 hours.  Urinalysis No results found for: COLORURINE  STUDIES Diagnostic bilateral mammography with CAD and tomography on 12/18/2017 [facility] showing: breast density category B. There was no evidence of malignancy.    ASSESSMENT: 62 y.o. BRCA negative [place] woman status post right breast lower outer quadrant  biopsy 12/29/2013 for a clinical T2 N1, stage IIB invasive ductal carcinoma, grade 3, triple negative, with an MIB-1 of 80%.  (1) status post right lumpectomy and sentinel lymph node sampling 02/08/2014 for a pT2 pN0, stage IIA invasive ductal carcinoma, grade 3, repeat prognostic panel again triple negative  (2) adjuvant chemotherapy with doxorubicin and cyclophosphamide in dose dense fashion x4 completed 04/13/2014,  followed by carboplatin and Abraxane weekly x7 completed 06/30/2014  (a) additional 5 planned cycles cancelled because of neuropathy  (3) adjuvant radiation completed 10/11/2014  1) Right breast /50.4Gy in 28 Fractions  2) Right breast boost / 10 Gy in 5  fractions  (4) status post remote TAH BSO   (5) Status post removal of a pituitary adenoma 09/05/2005, no malignancy identified  (6) moderately controlled hypertension  (7) Anemia -likely secondary to "chronic illness", last requiring blood transfusion on 06/24/14  PLAN: Karise is now more than 4 years out from  definitive surgery for her breast cancer with no evidence of disease recurrence.  This is very favorable.  I encouraged her continuing to try to bring her diabetes under control and congratulated her on her daughter success as a model.  We reviewed her mammogram which is negative and also shows her breasts are no longer dense.  That is also favorable.  I am going to see her one last time a year from now.  At that point I expect she will "graduate" from follow-up  She knows to call for any other issues that may develop before that visit.   [name], Valentino Hue, MD  06/09/18 9:35 AM Medical Oncology and Hematology [facility] [address] [place], [place] [address] Tel. [phone]    Fax. [phone]  Fonnie Birkenhead, am acting as scribe for Lowella Dell MD.  I, Ruthann Cancer MD, have reviewed the above documentation for accuracy and completeness, and I agree with the above.

## 2018-06-09 ENCOUNTER — Inpatient Hospital Stay (HOSPITAL_BASED_OUTPATIENT_CLINIC_OR_DEPARTMENT_OTHER): Payer: BLUE CROSS/BLUE SHIELD | Admitting: Oncology

## 2018-06-09 ENCOUNTER — Inpatient Hospital Stay: Payer: BLUE CROSS/BLUE SHIELD | Attending: Oncology

## 2018-06-09 ENCOUNTER — Telehealth: Payer: Self-pay | Admitting: Oncology

## 2018-06-09 VITALS — BP 153/80 | HR 93 | Temp 98.6°F | Resp 18 | Ht 65.0 in | Wt 245.4 lb

## 2018-06-09 DIAGNOSIS — E119 Type 2 diabetes mellitus without complications: Secondary | ICD-10-CM

## 2018-06-09 DIAGNOSIS — Z9011 Acquired absence of right breast and nipple: Secondary | ICD-10-CM | POA: Diagnosis not present

## 2018-06-09 DIAGNOSIS — I10 Essential (primary) hypertension: Secondary | ICD-10-CM | POA: Diagnosis not present

## 2018-06-09 DIAGNOSIS — Z79899 Other long term (current) drug therapy: Secondary | ICD-10-CM

## 2018-06-09 DIAGNOSIS — Z87891 Personal history of nicotine dependence: Secondary | ICD-10-CM | POA: Insufficient documentation

## 2018-06-09 DIAGNOSIS — Z9079 Acquired absence of other genital organ(s): Secondary | ICD-10-CM | POA: Diagnosis not present

## 2018-06-09 DIAGNOSIS — Z809 Family history of malignant neoplasm, unspecified: Secondary | ICD-10-CM | POA: Insufficient documentation

## 2018-06-09 DIAGNOSIS — Z9221 Personal history of antineoplastic chemotherapy: Secondary | ICD-10-CM

## 2018-06-09 DIAGNOSIS — Z90710 Acquired absence of both cervix and uterus: Secondary | ICD-10-CM | POA: Insufficient documentation

## 2018-06-09 DIAGNOSIS — D649 Anemia, unspecified: Secondary | ICD-10-CM

## 2018-06-09 DIAGNOSIS — Z90722 Acquired absence of ovaries, bilateral: Secondary | ICD-10-CM | POA: Diagnosis not present

## 2018-06-09 DIAGNOSIS — Z853 Personal history of malignant neoplasm of breast: Secondary | ICD-10-CM | POA: Diagnosis not present

## 2018-06-09 DIAGNOSIS — Z171 Estrogen receptor negative status [ER-]: Secondary | ICD-10-CM

## 2018-06-09 DIAGNOSIS — Z8049 Family history of malignant neoplasm of other genital organs: Secondary | ICD-10-CM | POA: Diagnosis not present

## 2018-06-09 DIAGNOSIS — Z923 Personal history of irradiation: Secondary | ICD-10-CM | POA: Insufficient documentation

## 2018-06-09 DIAGNOSIS — Z7984 Long term (current) use of oral hypoglycemic drugs: Secondary | ICD-10-CM | POA: Insufficient documentation

## 2018-06-09 DIAGNOSIS — Z803 Family history of malignant neoplasm of breast: Secondary | ICD-10-CM | POA: Insufficient documentation

## 2018-06-09 DIAGNOSIS — C50511 Malignant neoplasm of lower-outer quadrant of right female breast: Secondary | ICD-10-CM

## 2018-06-09 LAB — CBC WITH DIFFERENTIAL (CANCER CENTER ONLY)
Basophils Absolute: 0 10*3/uL (ref 0.0–0.1)
Basophils Relative: 0 %
EOS ABS: 0.2 10*3/uL (ref 0.0–0.5)
EOS PCT: 3 %
HCT: 31.7 % — ABNORMAL LOW (ref 34.8–46.6)
Hemoglobin: 10.2 g/dL — ABNORMAL LOW (ref 11.6–15.9)
LYMPHS ABS: 1.3 10*3/uL (ref 0.9–3.3)
Lymphocytes Relative: 24 %
MCH: 27.8 pg (ref 25.1–34.0)
MCHC: 32.2 g/dL (ref 31.5–36.0)
MCV: 86.4 fL (ref 79.5–101.0)
MONO ABS: 0.4 10*3/uL (ref 0.1–0.9)
MONOS PCT: 8 %
Neutro Abs: 3.6 10*3/uL (ref 1.5–6.5)
Neutrophils Relative %: 65 %
PLATELETS: 218 10*3/uL (ref 145–400)
RBC: 3.67 MIL/uL — ABNORMAL LOW (ref 3.70–5.45)
RDW: 16.2 % — ABNORMAL HIGH (ref 11.2–14.5)
WBC Count: 5.6 10*3/uL (ref 3.9–10.3)

## 2018-06-09 LAB — CMP (CANCER CENTER ONLY)
ALT: 29 U/L (ref 0–44)
AST: 23 U/L (ref 15–41)
Albumin: 3.5 g/dL (ref 3.5–5.0)
Alkaline Phosphatase: 93 U/L (ref 38–126)
Anion gap: 12 (ref 5–15)
BUN: 27 mg/dL — ABNORMAL HIGH (ref 6–20)
CALCIUM: 9.7 mg/dL (ref 8.9–10.3)
CHLORIDE: 108 mmol/L (ref 98–111)
CO2: 21 mmol/L — AB (ref 22–32)
CREATININE: 1.6 mg/dL — AB (ref 0.44–1.00)
GFR, EST AFRICAN AMERICAN: 39 mL/min — AB (ref >60)
GFR, Estimated: 34 mL/min — ABNORMAL LOW (ref >60)
Glucose, Bld: 185 mg/dL — ABNORMAL HIGH (ref 70–99)
Potassium: 4.7 mmol/L (ref 3.5–5.1)
SODIUM: 141 mmol/L (ref 135–145)
Total Bilirubin: 0.3 mg/dL (ref 0.3–1.2)
Total Protein: 7.6 g/dL (ref 6.5–8.1)

## 2018-06-09 NOTE — Telephone Encounter (Signed)
Gave avs and calendar ° °

## 2018-10-28 ENCOUNTER — Inpatient Hospital Stay (HOSPITAL_COMMUNITY)
Admission: EM | Admit: 2018-10-28 | Discharge: 2018-11-03 | DRG: 871 | Disposition: A | Discharge status: Home / Self Care | Payer: BLUE CROSS/BLUE SHIELD | Attending: Nephrology | Admitting: Nephrology

## 2018-10-28 ENCOUNTER — Emergency Department (HOSPITAL_COMMUNITY): Payer: BLUE CROSS/BLUE SHIELD

## 2018-10-28 ENCOUNTER — Encounter (HOSPITAL_COMMUNITY): Payer: Self-pay | Admitting: Emergency Medicine

## 2018-10-28 ENCOUNTER — Other Ambulatory Visit: Payer: Self-pay

## 2018-10-28 DIAGNOSIS — Y92009 Unspecified place in unspecified non-institutional (private) residence as the place of occurrence of the external cause: Secondary | ICD-10-CM

## 2018-10-28 DIAGNOSIS — Z8639 Personal history of other endocrine, nutritional and metabolic disease: Secondary | ICD-10-CM

## 2018-10-28 DIAGNOSIS — R740 Nonspecific elevation of levels of transaminase and lactic acid dehydrogenase [LDH]: Secondary | ICD-10-CM | POA: Diagnosis not present

## 2018-10-28 DIAGNOSIS — Z90710 Acquired absence of both cervix and uterus: Secondary | ICD-10-CM | POA: Diagnosis not present

## 2018-10-28 DIAGNOSIS — R270 Ataxia, unspecified: Secondary | ICD-10-CM | POA: Diagnosis not present

## 2018-10-28 DIAGNOSIS — R410 Disorientation, unspecified: Secondary | ICD-10-CM | POA: Diagnosis not present

## 2018-10-28 DIAGNOSIS — Z923 Personal history of irradiation: Secondary | ICD-10-CM | POA: Diagnosis not present

## 2018-10-28 DIAGNOSIS — R838 Other abnormal findings in cerebrospinal fluid: Secondary | ICD-10-CM | POA: Diagnosis not present

## 2018-10-28 DIAGNOSIS — R509 Fever, unspecified: Secondary | ICD-10-CM | POA: Diagnosis present

## 2018-10-28 DIAGNOSIS — Z833 Family history of diabetes mellitus: Secondary | ICD-10-CM

## 2018-10-28 DIAGNOSIS — M6282 Rhabdomyolysis: Secondary | ICD-10-CM | POA: Diagnosis not present

## 2018-10-28 DIAGNOSIS — N183 Chronic kidney disease, stage 3 unspecified: Secondary | ICD-10-CM | POA: Diagnosis present

## 2018-10-28 DIAGNOSIS — Z87891 Personal history of nicotine dependence: Secondary | ICD-10-CM

## 2018-10-28 DIAGNOSIS — R51 Headache: Secondary | ICD-10-CM

## 2018-10-28 DIAGNOSIS — G9340 Encephalopathy, unspecified: Secondary | ICD-10-CM | POA: Diagnosis not present

## 2018-10-28 DIAGNOSIS — E7800 Pure hypercholesterolemia, unspecified: Secondary | ICD-10-CM | POA: Diagnosis not present

## 2018-10-28 DIAGNOSIS — H409 Unspecified glaucoma: Secondary | ICD-10-CM | POA: Diagnosis not present

## 2018-10-28 DIAGNOSIS — Z8249 Family history of ischemic heart disease and other diseases of the circulatory system: Secondary | ICD-10-CM | POA: Diagnosis not present

## 2018-10-28 DIAGNOSIS — Z853 Personal history of malignant neoplasm of breast: Secondary | ICD-10-CM

## 2018-10-28 DIAGNOSIS — R6510 Systemic inflammatory response syndrome (SIRS) of non-infectious origin without acute organ dysfunction: Secondary | ICD-10-CM | POA: Diagnosis present

## 2018-10-28 DIAGNOSIS — W08XXXA Fall from other furniture, initial encounter: Secondary | ICD-10-CM | POA: Diagnosis present

## 2018-10-28 DIAGNOSIS — E876 Hypokalemia: Secondary | ICD-10-CM | POA: Diagnosis present

## 2018-10-28 DIAGNOSIS — N289 Disorder of kidney and ureter, unspecified: Secondary | ICD-10-CM | POA: Diagnosis present

## 2018-10-28 DIAGNOSIS — R4701 Aphasia: Secondary | ICD-10-CM | POA: Diagnosis present

## 2018-10-28 DIAGNOSIS — B9689 Other specified bacterial agents as the cause of diseases classified elsewhere: Secondary | ICD-10-CM | POA: Diagnosis not present

## 2018-10-28 DIAGNOSIS — N179 Acute kidney failure, unspecified: Secondary | ICD-10-CM | POA: Diagnosis not present

## 2018-10-28 DIAGNOSIS — I10 Essential (primary) hypertension: Secondary | ICD-10-CM | POA: Diagnosis not present

## 2018-10-28 DIAGNOSIS — R0902 Hypoxemia: Secondary | ICD-10-CM | POA: Diagnosis not present

## 2018-10-28 DIAGNOSIS — E119 Type 2 diabetes mellitus without complications: Secondary | ICD-10-CM | POA: Diagnosis not present

## 2018-10-28 DIAGNOSIS — R519 Headache, unspecified: Secondary | ICD-10-CM | POA: Diagnosis present

## 2018-10-28 DIAGNOSIS — Z7984 Long term (current) use of oral hypoglycemic drugs: Secondary | ICD-10-CM

## 2018-10-28 DIAGNOSIS — E1122 Type 2 diabetes mellitus with diabetic chronic kidney disease: Secondary | ICD-10-CM | POA: Diagnosis not present

## 2018-10-28 DIAGNOSIS — Z9221 Personal history of antineoplastic chemotherapy: Secondary | ICD-10-CM

## 2018-10-28 DIAGNOSIS — G009 Bacterial meningitis, unspecified: Secondary | ICD-10-CM | POA: Diagnosis not present

## 2018-10-28 DIAGNOSIS — R5383 Other fatigue: Secondary | ICD-10-CM | POA: Diagnosis not present

## 2018-10-28 DIAGNOSIS — I129 Hypertensive chronic kidney disease with stage 1 through stage 4 chronic kidney disease, or unspecified chronic kidney disease: Secondary | ICD-10-CM | POA: Diagnosis present

## 2018-10-28 DIAGNOSIS — Z86018 Personal history of other benign neoplasm: Secondary | ICD-10-CM

## 2018-10-28 DIAGNOSIS — E860 Dehydration: Secondary | ICD-10-CM | POA: Diagnosis not present

## 2018-10-28 DIAGNOSIS — G039 Meningitis, unspecified: Secondary | ICD-10-CM | POA: Diagnosis not present

## 2018-10-28 DIAGNOSIS — R4781 Slurred speech: Secondary | ICD-10-CM | POA: Diagnosis not present

## 2018-10-28 DIAGNOSIS — E1165 Type 2 diabetes mellitus with hyperglycemia: Secondary | ICD-10-CM | POA: Diagnosis present

## 2018-10-28 DIAGNOSIS — G9349 Other encephalopathy: Secondary | ICD-10-CM | POA: Diagnosis not present

## 2018-10-28 DIAGNOSIS — A419 Sepsis, unspecified organism: Principal | ICD-10-CM

## 2018-10-28 DIAGNOSIS — A4902 Methicillin resistant Staphylococcus aureus infection, unspecified site: Secondary | ICD-10-CM | POA: Diagnosis not present

## 2018-10-28 DIAGNOSIS — R000 Tachycardia, unspecified: Secondary | ICD-10-CM | POA: Diagnosis not present

## 2018-10-28 DIAGNOSIS — R404 Transient alteration of awareness: Secondary | ICD-10-CM | POA: Diagnosis not present

## 2018-10-28 DIAGNOSIS — D352 Benign neoplasm of pituitary gland: Secondary | ICD-10-CM | POA: Diagnosis not present

## 2018-10-28 DIAGNOSIS — T796XXA Traumatic ischemia of muscle, initial encounter: Secondary | ICD-10-CM | POA: Diagnosis not present

## 2018-10-28 DIAGNOSIS — Z803 Family history of malignant neoplasm of breast: Secondary | ICD-10-CM | POA: Diagnosis not present

## 2018-10-28 DIAGNOSIS — R6520 Severe sepsis without septic shock: Secondary | ICD-10-CM | POA: Diagnosis not present

## 2018-10-28 DIAGNOSIS — I7090 Unspecified atherosclerosis: Secondary | ICD-10-CM | POA: Diagnosis present

## 2018-10-28 DIAGNOSIS — R4182 Altered mental status, unspecified: Secondary | ICD-10-CM | POA: Diagnosis not present

## 2018-10-28 DIAGNOSIS — N189 Chronic kidney disease, unspecified: Secondary | ICD-10-CM | POA: Diagnosis present

## 2018-10-28 DIAGNOSIS — R0989 Other specified symptoms and signs involving the circulatory and respiratory systems: Secondary | ICD-10-CM | POA: Diagnosis not present

## 2018-10-28 DIAGNOSIS — Z79899 Other long term (current) drug therapy: Secondary | ICD-10-CM

## 2018-10-28 HISTORY — DX: Benign neoplasm of pituitary gland: D35.2

## 2018-10-28 LAB — COMPREHENSIVE METABOLIC PANEL
ALT: 25 U/L (ref 0–44)
AST: 41 U/L (ref 15–41)
Albumin: 4.1 g/dL (ref 3.5–5.0)
Alkaline Phosphatase: 88 U/L (ref 38–126)
Anion gap: 13 (ref 5–15)
BUN: 14 mg/dL (ref 6–20)
CO2: 18 mmol/L — ABNORMAL LOW (ref 22–32)
Calcium: 8.9 mg/dL (ref 8.9–10.3)
Chloride: 100 mmol/L (ref 98–111)
Creatinine, Ser: 1.4 mg/dL — ABNORMAL HIGH (ref 0.44–1.00)
GFR calc Af Amer: 47 mL/min — ABNORMAL LOW (ref >60)
GFR calc non Af Amer: 41 mL/min — ABNORMAL LOW (ref >60)
Glucose, Bld: 338 mg/dL — ABNORMAL HIGH (ref 70–99)
Potassium: 3.4 mmol/L — ABNORMAL LOW (ref 3.5–5.1)
Sodium: 131 mmol/L — ABNORMAL LOW (ref 135–145)
Total Bilirubin: 0.7 mg/dL (ref 0.3–1.2)
Total Protein: 8.2 g/dL — ABNORMAL HIGH (ref 6.5–8.1)

## 2018-10-28 LAB — CBC WITH DIFFERENTIAL/PLATELET
Abs Immature Granulocytes: 0.07 10*3/uL (ref 0.00–0.07)
Basophils Absolute: 0 10*3/uL (ref 0.0–0.1)
Basophils Relative: 0 %
Eosinophils Absolute: 0 10*3/uL (ref 0.0–0.5)
Eosinophils Relative: 0 %
HCT: 33.4 % — ABNORMAL LOW (ref 36.0–46.0)
Hemoglobin: 10.4 g/dL — ABNORMAL LOW (ref 12.0–15.0)
Immature Granulocytes: 1 %
Lymphocytes Relative: 14 %
Lymphs Abs: 1.6 10*3/uL (ref 0.7–4.0)
MCH: 27.8 pg (ref 26.0–34.0)
MCHC: 31.1 g/dL (ref 30.0–36.0)
MCV: 89.3 fL (ref 80.0–100.0)
Monocytes Absolute: 0.9 10*3/uL (ref 0.1–1.0)
Monocytes Relative: 8 %
Neutro Abs: 9 10*3/uL — ABNORMAL HIGH (ref 1.7–7.7)
Neutrophils Relative %: 77 %
Platelets: 243 10*3/uL (ref 150–400)
RBC: 3.74 MIL/uL — ABNORMAL LOW (ref 3.87–5.11)
RDW: 14.6 % (ref 11.5–15.5)
WBC: 11.6 10*3/uL — ABNORMAL HIGH (ref 4.0–10.5)
nRBC: 0 % (ref 0.0–0.2)

## 2018-10-28 LAB — URINALYSIS, ROUTINE W REFLEX MICROSCOPIC
Bacteria, UA: NONE SEEN
Bilirubin Urine: NEGATIVE
Glucose, UA: >=500 mg/dL — AB
Ketones, ur: 5 mg/dL — AB
Leukocytes,Ua: NEGATIVE
Nitrite: NEGATIVE
Protein, ur: 100 mg/dL — AB
Specific Gravity, Urine: 1.008 (ref 1.005–1.030)
pH: 6 (ref 5.0–8.0)

## 2018-10-28 LAB — CBG MONITORING, ED: Glucose-Capillary: 323 mg/dL — ABNORMAL HIGH (ref 70–99)

## 2018-10-28 LAB — LACTIC ACID, PLASMA: Lactic Acid, Venous: 2 mmol/L (ref 0.5–1.9)

## 2018-10-28 MED ORDER — VANCOMYCIN HCL 10 G IV SOLR
2000.0000 mg | Freq: Once | INTRAVENOUS | Status: AC
  Administered 2018-10-28: 2000 mg via INTRAVENOUS
  Filled 2018-10-28: qty 2000

## 2018-10-28 MED ORDER — ACETAMINOPHEN 325 MG PO TABS
650.0000 mg | ORAL_TABLET | Freq: Once | ORAL | Status: AC
  Administered 2018-10-28: 650 mg via ORAL
  Filled 2018-10-28: qty 2

## 2018-10-28 MED ORDER — INSULIN ASPART 100 UNIT/ML ~~LOC~~ SOLN
5.0000 [IU] | Freq: Once | SUBCUTANEOUS | Status: AC
  Administered 2018-10-28: 5 [IU] via INTRAVENOUS
  Filled 2018-10-28: qty 1

## 2018-10-28 MED ORDER — LACTATED RINGERS IV BOLUS
2000.0000 mL | Freq: Once | INTRAVENOUS | Status: AC
  Administered 2018-10-28 (×2): 1000 mL via INTRAVENOUS

## 2018-10-28 MED ORDER — SODIUM CHLORIDE 0.9 % IV SOLN
2.0000 g | Freq: Once | INTRAVENOUS | Status: AC
  Administered 2018-10-28: 2 g via INTRAVENOUS
  Filled 2018-10-28: qty 2

## 2018-10-28 MED ORDER — LACTATED RINGERS IV BOLUS
1000.0000 mL | Freq: Once | INTRAVENOUS | Status: AC
  Administered 2018-10-28: 1000 mL via INTRAVENOUS

## 2018-10-28 MED ORDER — METRONIDAZOLE IN NACL 5-0.79 MG/ML-% IV SOLN
500.0000 mg | Freq: Three times a day (TID) | INTRAVENOUS | Status: DC
  Administered 2018-10-28 – 2018-10-29 (×2): 500 mg via INTRAVENOUS
  Filled 2018-10-28 (×2): qty 100

## 2018-10-28 MED ORDER — VANCOMYCIN HCL IN DEXTROSE 1-5 GM/200ML-% IV SOLN: 1000.0000 mg | Freq: Once | INTRAVENOUS | Status: DC

## 2018-10-28 MED ORDER — INSULIN ASPART 100 UNIT/ML ~~LOC~~ SOLN: 5.0000 [IU] | Freq: Once | SUBCUTANEOUS | Status: DC

## 2018-10-28 NOTE — ED Triage Notes (Signed)
Pt arriving via EMS from home for fever, tachycardia, and hyperglycemia. Family called 911 due to family not being able to get in touch with her all day. Pt found sitting on the floor next to her couch. Denies falling. Pt A&O x2. 1000mg  Tylenol given prior to arrival.

## 2018-10-28 NOTE — ED Notes (Signed)
Patient transported to X-ray 

## 2018-10-28 NOTE — ED Notes (Signed)
Sister's phone numbers: Margaretha Sheffield [phone] Pam [phone]  Valentino Saxon (father) Cell: [phone] Home: [phone]

## 2018-10-28 NOTE — ED Notes (Signed)
Pt asked for PO fluids, MD said that's fine. Pt given diet gingerale.

## 2018-10-28 NOTE — ED Provider Notes (Signed)
[facility] DEPT Provider Note   CSN: [id number] Arrival date & time: 10/28/18  1959    History   Chief Complaint Chief Complaint  Patient presents with  . Fever  . Hyperglycemia    HPI [name] is a 63 y.o. female.  HPI   36yF brought in by EMS after family called them because they couldn't get in touch with her all day. She was found sitting on the floor next to the couch. She says she lost her balance and fell. Was too weak to get up. She was on the floor for most of the day. Noted to be febrile. She says she has felt warm today. Occasional cough. Vomiting. No diarrhea. No urinary complaints.   Past Medical History:  Diagnosis Date  . Breast cancer ([place]) 12/29/13   right 6:00 o'clock, lower outer  . Diabetes mellitus   . Family history of malignant neoplasm of breast   . Glaucoma   . High cholesterol   . History of radiation therapy 08/12/14- 10/11/14   right breast /50.4 Gy/28 fx, right breast boost/ 10 Gy/ 5 fx  . HTN (hypertension)   . Personal history of chemotherapy   . Personal history of radiation therapy   . Wears dentures    full top-partial bottom  . Wears glasses     Patient Active Problem List   Diagnosis Date Noted  . Malignant neoplasm of lower-outer quadrant of right breast of female, estrogen receptor negative ([facility]) 06/09/2016  . Genetic testing 07/23/2015  . Thrombocytopenia, unspecified ([facility]) 06/01/2014  . Diarrhea 05/26/2014  . Chemotherapy-induced nausea 05/26/2014  . Anemia in neoplastic disease 04/27/2014  . Family history of malignant neoplasm of breast   . HTN (hypertension)   . Diabetes mellitus type II, non insulin dependent ([place])   . High cholesterol     Past Surgical History:  Procedure Laterality Date  . ABDOMINAL HYSTERECTOMY  1993   endometriosis  . BREAST BIOPSY Left 12/29/2013  . BREAST LUMPECTOMY Right 02/08/2014  . BREAST LUMPECTOMY WITH AXILLARY LYMPH NODE BIOPSY Right 02/08/14   invasive ductal  . PORTACATH PLACEMENT Left 02/08/2014   Procedure: INSERTION PORT-A-CATH;  Surgeon: Rolm Bookbinder, MD;  Location: [facility];  Service: General;  Laterality: Left;  . TONSILLECTOMY    . TRANSPHENOIDAL PITUITARY RESECTION  2006     OB History   No obstetric history on file.      Home Medications    Prior to Admission medications   Medication Sig Start Date End Date Taking? Authorizing Provider  amLODipine (NORVASC) 10 MG tablet TAKE ONE TABLET BY MOUTH ONCE DAILY 03/15/14   [provider]  Atorvastatin Calcium (LIPITOR PO) Take 40 mg by mouth daily.     Chesley Noon, MD  Empagliflozin-Linagliptin (GLYXAMBI) 25-5 MG TABS Take by mouth. Reported on 12/03/2015 11/12/15   [provider]  gabapentin (NEURONTIN) 100 MG capsule TAKE 1 CAPSULE BY MOUTH THREE TIMES DAILY AT  MEALTIME 04/30/18   Magrinat, Virgie Dad, MD  gabapentin (NEURONTIN) 300 MG capsule TAKE 1 CAPSULE BY MOUTH AT BEDTIME 04/30/18   Magrinat, Virgie Dad, MD  glucose monitoring kit (FREESTYLE) monitoring kit One Touch Ultra machine and strips, patient tests her blood sugars bid 04/28/14   [provider]  glyBURIDE (DIABETA) 5 MG tablet Take 5 mg by mouth daily with breakfast.     Chesley Noon, MD  lisinopril-hydrochlorothiazide (PRINZIDE,ZESTORETIC) 20-12.5 MG per tablet Take 2 tablets by mouth daily.  12/25/13   Chesley Noon, MD  loratadine (CLARITIN) 10 MG tablet Take 1 tablet (10 mg total) by mouth daily. 07/16/16   Magrinat, Virgie Dad, MD  metFORMIN (GLUCOPHAGE-XR) 500 MG 24 hr tablet Take 500 mg by mouth 2 (two) times daily.  12/25/13   Chesley Noon, MD  ONE TOUCH ULTRA TEST test strip  03/16/14   [provider]    Family History Family History  Problem Relation Age of Onset  . Hypertension Mother   . Diabetes type II Mother   . Breast cancer Mother        dx ~70; deceased 64  . Hypertension Father   . Breast cancer Maternal Aunt         deceased 28  . Cancer Maternal Uncle        unk. type; deceased 37s  . Cancer Maternal Uncle        unk. type; deceased late 13s  . Uterine cancer Cousin 19       daughter of an unaffected mat aunt who is 38    Social History Social History   Tobacco Use  . Smoking status: Former Smoker    Types: Cigarettes    Last attempt to quit: 01/31/1994    Years since quitting: 24.7  Substance Use Topics  . Alcohol use: Yes    Comment: occ  . Drug use: No     Allergies   Patient has no known allergies.   Review of Systems Review of Systems  All systems reviewed and negative, other than as noted in HPI.  Physical Exam Updated Vital Signs BP (!) 172/90 (BP Location: Right Arm)   Pulse (!) 117   Temp (!) 102.2 F (39 C) (Oral)   Resp (!) 21   Ht '5\' 6"'$  (1.676 m)   Wt 112 kg   SpO2 98%   BMI 39.87 kg/m   Physical Exam Vitals signs and nursing note reviewed.  Constitutional:      General: She is not in acute distress.    Appearance: She is well-developed.  HENT:     Head: Normocephalic and atraumatic.  Eyes:     General:        Right eye: No discharge.        Left eye: No discharge.     Conjunctiva/sclera: Conjunctivae normal.  Neck:     Musculoskeletal: Neck supple.  Cardiovascular:     Rate and Rhythm: Regular rhythm. Tachycardia present.     Heart sounds: Normal heart sounds. No murmur. No friction rub. No gallop.   Pulmonary:     Effort: Pulmonary effort is normal. No respiratory distress.     Breath sounds: Normal breath sounds.  Abdominal:     General: There is no distension.     Palpations: Abdomen is soft.     Tenderness: There is no abdominal tenderness.  Musculoskeletal:        General: No tenderness.  Skin:    General: Skin is warm and dry.  Neurological:     General: No focal deficit present.     Mental Status: She is alert.     Cranial Nerves: No cranial nerve deficit.     Sensory: No sensory deficit.  Psychiatric:        Behavior: Behavior  normal.        Thought Content: Thought content normal.      ED Treatments / Results  Labs (all labs ordered are listed, but only abnormal results are  displayed) Labs Reviewed  LACTIC ACID, PLASMA - Abnormal; Notable for the following components:      Result Value   Lactic Acid, Venous 2.0 (*)    All other components within normal limits  COMPREHENSIVE METABOLIC PANEL - Abnormal; Notable for the following components:   Sodium 131 (*)    Potassium 3.4 (*)    CO2 18 (*)    Glucose, Bld 338 (*)    Creatinine, Ser 1.40 (*)    Total Protein 8.2 (*)    GFR calc non Af Amer 41 (*)    GFR calc Af Amer 47 (*)    All other components within normal limits  CBC WITH DIFFERENTIAL/PLATELET - Abnormal; Notable for the following components:   WBC 11.6 (*)    RBC 3.74 (*)    Hemoglobin 10.4 (*)    HCT 33.4 (*)    Neutro Abs 9.0 (*)    All other components within normal limits  URINALYSIS, ROUTINE W REFLEX MICROSCOPIC - Abnormal; Notable for the following components:   Glucose, UA >=500 (*)    Hgb urine dipstick LARGE (*)    Ketones, ur 5 (*)    Protein, ur 100 (*)    All other components within normal limits  CBG MONITORING, ED - Abnormal; Notable for the following components:   Glucose-Capillary 323 (*)    All other components within normal limits  CULTURE, BLOOD (ROUTINE X 2)  CULTURE, BLOOD (ROUTINE X 2)  INFLUENZA PANEL BY PCR (TYPE A & B)    EKG EKG Interpretation  Date/Time:  Thursday October 28 2018 20:43:07 EST Ventricular Rate:  111 PR Interval:    QRS Duration: 66 QT Interval:  323 QTC Calculation: 439 R Axis:   3 Text Interpretation:  Sinus tachycardia Left ventricular hypertrophy Confirmed by Virgel Manifold [phone]) on 10/28/2018 8:46:44 PM   Radiology Dg Chest 2 View  Result Date: 10/28/2018 CLINICAL DATA:  Fever and congestion EXAM: CHEST - 2 VIEW COMPARISON:  02/08/2014 FINDINGS: The heart size and mediastinal contours are within normal limits. Both lungs are  clear. Aortic atherosclerosis. Degenerative changes of the spine. IMPRESSION: No active cardiopulmonary disease. Electronically Signed   By: Donavan Foil M.D.   On: 10/28/2018 21:40    Procedures Procedures (including critical care time)  Medications Ordered in ED Medications  ceFEPIme (MAXIPIME) 2 g in sodium chloride 0.9 % 100 mL IVPB (has no administration in time range)  metroNIDAZOLE (FLAGYL) IVPB 500 mg (has no administration in time range)  vancomycin (VANCOCIN) IVPB 1000 mg/200 mL premix (has no administration in time range)  lactated ringers bolus 1,000 mL (has no administration in time range)  acetaminophen (TYLENOL) tablet 650 mg (has no administration in time range)     Initial Impression / Assessment and Plan / ED Course  I have reviewed the triage vital signs and the nursing notes.  Pertinent labs & imaging results that were available during my care of the patient were reviewed by me and considered in my medical decision making (see chart for details).  I have reviewed the triage vital signs and the nursing notes. Prior records were reviewed for additional information.    Pertinent labs & imaging results that were available during my care of the patient were reviewed by me and considered in my medical decision making (see chart for details).  62yF with fever. C/o n/v earlier today. Abdominal exam benign. I don't have a clear source of her fever at this time. She did receive an  empiric dose of abx. CXR ok. Doesn't have a UTI. Flu pending. She is feeling better but still needing a little assistance getting up to use the bathroom. She spent the day on the floor because she was too weak to get up. She lives alone. This may just be viral illness but would probably be prudent to watch her overnight.   Final Clinical Impressions(s) / ED Diagnoses   Final diagnoses:  Febrile illness    ED Discharge Orders    None       Virgel Manifold, MD 10/28/18 2323

## 2018-10-28 NOTE — Progress Notes (Signed)
A consult was received from an ED physician for vancomycin and cefepime per pharmacy dosing.  The patient's profile has been reviewed for ht/wt/allergies/indication/available labs.   A one time order has been placed for vancomycin 2 gm and cefepime 2 gm.   Further antibiotics/pharmacy consults should be ordered by admitting physician if indicated.                       Thank you, Eudelia Bunch, Pharm.D [phone] 10/28/2018 8:46 PM

## 2018-10-28 NOTE — ED Notes (Signed)
Bed: WA17 Expected date:  Expected time:  Means of arrival:  Comments: 14F fever/ code sepsis

## 2018-10-29 ENCOUNTER — Observation Stay (HOSPITAL_COMMUNITY): Payer: BLUE CROSS/BLUE SHIELD

## 2018-10-29 ENCOUNTER — Emergency Department (HOSPITAL_COMMUNITY): Payer: BLUE CROSS/BLUE SHIELD

## 2018-10-29 ENCOUNTER — Inpatient Hospital Stay (HOSPITAL_COMMUNITY): Payer: BLUE CROSS/BLUE SHIELD

## 2018-10-29 DIAGNOSIS — Z8249 Family history of ischemic heart disease and other diseases of the circulatory system: Secondary | ICD-10-CM | POA: Diagnosis not present

## 2018-10-29 DIAGNOSIS — D352 Benign neoplasm of pituitary gland: Secondary | ICD-10-CM | POA: Diagnosis not present

## 2018-10-29 DIAGNOSIS — R5383 Other fatigue: Secondary | ICD-10-CM

## 2018-10-29 DIAGNOSIS — G039 Meningitis, unspecified: Secondary | ICD-10-CM | POA: Insufficient documentation

## 2018-10-29 DIAGNOSIS — E860 Dehydration: Secondary | ICD-10-CM | POA: Diagnosis not present

## 2018-10-29 DIAGNOSIS — Z90710 Acquired absence of both cervix and uterus: Secondary | ICD-10-CM | POA: Diagnosis not present

## 2018-10-29 DIAGNOSIS — Z86018 Personal history of other benign neoplasm: Secondary | ICD-10-CM

## 2018-10-29 DIAGNOSIS — G009 Bacterial meningitis, unspecified: Secondary | ICD-10-CM | POA: Insufficient documentation

## 2018-10-29 DIAGNOSIS — G9340 Encephalopathy, unspecified: Secondary | ICD-10-CM | POA: Diagnosis present

## 2018-10-29 DIAGNOSIS — R000 Tachycardia, unspecified: Secondary | ICD-10-CM

## 2018-10-29 DIAGNOSIS — Z87891 Personal history of nicotine dependence: Secondary | ICD-10-CM | POA: Diagnosis not present

## 2018-10-29 DIAGNOSIS — B9689 Other specified bacterial agents as the cause of diseases classified elsewhere: Secondary | ICD-10-CM | POA: Diagnosis present

## 2018-10-29 DIAGNOSIS — E119 Type 2 diabetes mellitus without complications: Secondary | ICD-10-CM | POA: Diagnosis not present

## 2018-10-29 DIAGNOSIS — A419 Sepsis, unspecified organism: Secondary | ICD-10-CM | POA: Diagnosis present

## 2018-10-29 DIAGNOSIS — G9349 Other encephalopathy: Secondary | ICD-10-CM | POA: Diagnosis present

## 2018-10-29 DIAGNOSIS — Z833 Family history of diabetes mellitus: Secondary | ICD-10-CM | POA: Diagnosis not present

## 2018-10-29 DIAGNOSIS — N179 Acute kidney failure, unspecified: Secondary | ICD-10-CM | POA: Diagnosis not present

## 2018-10-29 DIAGNOSIS — E1122 Type 2 diabetes mellitus with diabetic chronic kidney disease: Secondary | ICD-10-CM | POA: Diagnosis present

## 2018-10-29 DIAGNOSIS — M6282 Rhabdomyolysis: Secondary | ICD-10-CM | POA: Diagnosis present

## 2018-10-29 DIAGNOSIS — R519 Headache, unspecified: Secondary | ICD-10-CM | POA: Diagnosis present

## 2018-10-29 DIAGNOSIS — Z923 Personal history of irradiation: Secondary | ICD-10-CM | POA: Diagnosis not present

## 2018-10-29 DIAGNOSIS — R740 Nonspecific elevation of levels of transaminase and lactic acid dehydrogenase [LDH]: Secondary | ICD-10-CM

## 2018-10-29 DIAGNOSIS — N289 Disorder of kidney and ureter, unspecified: Secondary | ICD-10-CM | POA: Diagnosis present

## 2018-10-29 DIAGNOSIS — H409 Unspecified glaucoma: Secondary | ICD-10-CM | POA: Diagnosis present

## 2018-10-29 DIAGNOSIS — R51 Headache: Secondary | ICD-10-CM | POA: Diagnosis not present

## 2018-10-29 DIAGNOSIS — N183 Chronic kidney disease, stage 3 unspecified: Secondary | ICD-10-CM | POA: Diagnosis present

## 2018-10-29 DIAGNOSIS — R838 Other abnormal findings in cerebrospinal fluid: Secondary | ICD-10-CM | POA: Diagnosis not present

## 2018-10-29 DIAGNOSIS — E7800 Pure hypercholesterolemia, unspecified: Secondary | ICD-10-CM | POA: Diagnosis present

## 2018-10-29 DIAGNOSIS — I129 Hypertensive chronic kidney disease with stage 1 through stage 4 chronic kidney disease, or unspecified chronic kidney disease: Secondary | ICD-10-CM | POA: Diagnosis present

## 2018-10-29 DIAGNOSIS — I10 Essential (primary) hypertension: Secondary | ICD-10-CM

## 2018-10-29 DIAGNOSIS — I7090 Unspecified atherosclerosis: Secondary | ICD-10-CM | POA: Diagnosis present

## 2018-10-29 DIAGNOSIS — Z803 Family history of malignant neoplasm of breast: Secondary | ICD-10-CM | POA: Diagnosis not present

## 2018-10-29 DIAGNOSIS — Z8639 Personal history of other endocrine, nutritional and metabolic disease: Secondary | ICD-10-CM

## 2018-10-29 DIAGNOSIS — A4902 Methicillin resistant Staphylococcus aureus infection, unspecified site: Secondary | ICD-10-CM | POA: Diagnosis not present

## 2018-10-29 DIAGNOSIS — Z853 Personal history of malignant neoplasm of breast: Secondary | ICD-10-CM | POA: Diagnosis not present

## 2018-10-29 DIAGNOSIS — E876 Hypokalemia: Secondary | ICD-10-CM | POA: Diagnosis present

## 2018-10-29 DIAGNOSIS — R4781 Slurred speech: Secondary | ICD-10-CM

## 2018-10-29 DIAGNOSIS — T796XXA Traumatic ischemia of muscle, initial encounter: Secondary | ICD-10-CM

## 2018-10-29 DIAGNOSIS — R6510 Systemic inflammatory response syndrome (SIRS) of non-infectious origin without acute organ dysfunction: Secondary | ICD-10-CM | POA: Diagnosis not present

## 2018-10-29 DIAGNOSIS — R112 Nausea with vomiting, unspecified: Secondary | ICD-10-CM | POA: Insufficient documentation

## 2018-10-29 DIAGNOSIS — Z9221 Personal history of antineoplastic chemotherapy: Secondary | ICD-10-CM | POA: Diagnosis not present

## 2018-10-29 DIAGNOSIS — W08XXXA Fall from other furniture, initial encounter: Secondary | ICD-10-CM | POA: Diagnosis present

## 2018-10-29 DIAGNOSIS — R4701 Aphasia: Secondary | ICD-10-CM | POA: Diagnosis not present

## 2018-10-29 DIAGNOSIS — R509 Fever, unspecified: Secondary | ICD-10-CM | POA: Diagnosis present

## 2018-10-29 DIAGNOSIS — Y92009 Unspecified place in unspecified non-institutional (private) residence as the place of occurrence of the external cause: Secondary | ICD-10-CM | POA: Diagnosis not present

## 2018-10-29 DIAGNOSIS — R4182 Altered mental status, unspecified: Secondary | ICD-10-CM | POA: Diagnosis not present

## 2018-10-29 DIAGNOSIS — E1165 Type 2 diabetes mellitus with hyperglycemia: Secondary | ICD-10-CM | POA: Diagnosis not present

## 2018-10-29 DIAGNOSIS — N189 Chronic kidney disease, unspecified: Secondary | ICD-10-CM | POA: Diagnosis present

## 2018-10-29 DIAGNOSIS — R270 Ataxia, unspecified: Secondary | ICD-10-CM | POA: Diagnosis not present

## 2018-10-29 LAB — PROTEIN, CSF: Total  Protein, CSF: 157 mg/dL — ABNORMAL HIGH (ref 15–45)

## 2018-10-29 LAB — CBC
HCT: 34.3 % — ABNORMAL LOW (ref 36.0–46.0)
Hemoglobin: 10.7 g/dL — ABNORMAL LOW (ref 12.0–15.0)
MCH: 27.7 pg (ref 26.0–34.0)
MCHC: 31.2 g/dL (ref 30.0–36.0)
MCV: 88.9 fL (ref 80.0–100.0)
Platelets: 210 10*3/uL (ref 150–400)
RBC: 3.86 MIL/uL — ABNORMAL LOW (ref 3.87–5.11)
RDW: 14.8 % (ref 11.5–15.5)
WBC: 11.9 10*3/uL — ABNORMAL HIGH (ref 4.0–10.5)
nRBC: 0 % (ref 0.0–0.2)

## 2018-10-29 LAB — CSF CELL COUNT WITH DIFFERENTIAL
EOS CSF: 0 % (ref 0–1)
EOS CSF: 0 % (ref 0–1)
Lymphs, CSF: 3 % — ABNORMAL LOW (ref 40–80)
Lymphs, CSF: 6 % — ABNORMAL LOW (ref 40–80)
Monocyte-Macrophage-Spinal Fluid: 12 % — ABNORMAL LOW (ref 15–45)
Monocyte-Macrophage-Spinal Fluid: 12 % — ABNORMAL LOW (ref 15–45)
RBC Count, CSF: 1000 /mm3 — ABNORMAL HIGH
RBC Count, CSF: 4000 /mm3 — ABNORMAL HIGH
SEGMENTED NEUTROPHILS-CSF: 85 % — AB (ref 0–6)
Segmented Neutrophils-CSF: 82 % — ABNORMAL HIGH (ref 0–6)
Tube #: 1
Tube #: 4
WBC, CSF: 2322 /mm3 (ref 0–5)
WBC, CSF: 4365 /mm3 (ref 0–5)

## 2018-10-29 LAB — BLOOD CULTURE ID PANEL (REFLEXED)

## 2018-10-29 LAB — BASIC METABOLIC PANEL
Anion gap: 10 (ref 5–15)
BUN: 13 mg/dL (ref 6–20)
CHLORIDE: 101 mmol/L (ref 98–111)
CO2: 23 mmol/L (ref 22–32)
Calcium: 9 mg/dL (ref 8.9–10.3)
Creatinine, Ser: 1.21 mg/dL — ABNORMAL HIGH (ref 0.44–1.00)
GFR calc Af Amer: 56 mL/min — ABNORMAL LOW (ref >60)
GFR calc non Af Amer: 49 mL/min — ABNORMAL LOW (ref >60)
Glucose, Bld: 270 mg/dL — ABNORMAL HIGH (ref 70–99)
Potassium: 3.2 mmol/L — ABNORMAL LOW (ref 3.5–5.1)
Sodium: 134 mmol/L — ABNORMAL LOW (ref 135–145)

## 2018-10-29 LAB — MRSA PCR SCREENING: MRSA by PCR: POSITIVE — AB

## 2018-10-29 LAB — CBG MONITORING, ED
Glucose-Capillary: 237 mg/dL — ABNORMAL HIGH (ref 70–99)
Glucose-Capillary: 261 mg/dL — ABNORMAL HIGH (ref 70–99)
Glucose-Capillary: 233 mg/dL — ABNORMAL HIGH (ref 70–99)

## 2018-10-29 LAB — INFLUENZA PANEL BY PCR (TYPE A & B)
Influenza A By PCR: NEGATIVE
Influenza B By PCR: NEGATIVE

## 2018-10-29 LAB — GLUCOSE, CAPILLARY
Glucose-Capillary: 180 mg/dL — ABNORMAL HIGH (ref 70–99)
Glucose-Capillary: 193 mg/dL — ABNORMAL HIGH (ref 70–99)

## 2018-10-29 LAB — PROTIME-INR
INR: 1.09
Prothrombin Time: 14 seconds (ref 11.4–15.2)

## 2018-10-29 LAB — LACTIC ACID, PLASMA
Lactic Acid, Venous: 2 mmol/L (ref 0.5–1.9)
Lactic Acid, Venous: 1.7 mmol/L (ref 0.5–1.9)

## 2018-10-29 LAB — CK
Total CK: 8227 U/L — ABNORMAL HIGH (ref 38–234)
Total CK: 11700 U/L — ABNORMAL HIGH (ref 38–234)

## 2018-10-29 LAB — PROCALCITONIN: Procalcitonin: 0.2 ng/mL

## 2018-10-29 LAB — APTT: aPTT: 32 seconds (ref 24–36)

## 2018-10-29 LAB — HIV ANTIBODY (ROUTINE TESTING W REFLEX): HIV Screen 4th Generation wRfx: NONREACTIVE

## 2018-10-29 LAB — GLUCOSE, CSF: GLUCOSE CSF: 87 mg/dL — AB (ref 40–70)

## 2018-10-29 MED ORDER — ONDANSETRON HCL 4 MG/2ML IJ SOLN: 4.0000 mg | Freq: Four times a day (QID) | INTRAMUSCULAR | Status: DC | PRN

## 2018-10-29 MED ORDER — DEXTROSE 5 % IV SOLN
800.0000 mg | Freq: Three times a day (TID) | INTRAVENOUS | Status: DC
  Administered 2018-10-29 – 2018-10-30 (×3): 800 mg via INTRAVENOUS
  Filled 2018-10-29 (×8): qty 16

## 2018-10-29 MED ORDER — INSULIN ASPART 100 UNIT/ML ~~LOC~~ SOLN: 0.0000 [IU] | SUBCUTANEOUS | Status: DC

## 2018-10-29 MED ORDER — ENOXAPARIN SODIUM 40 MG/0.4ML ~~LOC~~ SOLN
40.0000 mg | SUBCUTANEOUS | Status: DC
  Filled 2018-10-29: qty 0.4

## 2018-10-29 MED ORDER — ACETAMINOPHEN 650 MG RE SUPP: 650.0000 mg | Freq: Four times a day (QID) | RECTAL | Status: DC | PRN

## 2018-10-29 MED ORDER — DICLOFENAC SODIUM 75 MG PO TBEC
75.0000 mg | DELAYED_RELEASE_TABLET | Freq: Two times a day (BID) | ORAL | Status: DC
  Administered 2018-10-29 – 2018-11-03 (×11): 75 mg via ORAL
  Filled 2018-10-29 (×11): qty 1

## 2018-10-29 MED ORDER — LISINOPRIL-HYDROCHLOROTHIAZIDE 20-12.5 MG PO TABS: 2.0000 | ORAL_TABLET | Freq: Every day | ORAL | Status: DC

## 2018-10-29 MED ORDER — SODIUM CHLORIDE 0.9 % IV SOLN
INTRAVENOUS | Status: DC
  Administered 2018-10-29 – 2018-11-01 (×6): via INTRAVENOUS

## 2018-10-29 MED ORDER — VANCOMYCIN HCL 10 G IV SOLR
1250.0000 mg | INTRAVENOUS | Status: DC
  Administered 2018-10-29: 1250 mg via INTRAVENOUS
  Filled 2018-10-29 (×4): qty 1250

## 2018-10-29 MED ORDER — SODIUM CHLORIDE 0.9 % IV SOLN: 1.0000 g | Freq: Two times a day (BID) | INTRAVENOUS | Status: DC

## 2018-10-29 MED ORDER — ACETAMINOPHEN 325 MG PO TABS: 650.0000 mg | ORAL_TABLET | Freq: Four times a day (QID) | ORAL | Status: DC | PRN

## 2018-10-29 MED ORDER — TIZANIDINE HCL 4 MG PO TABS: 4.0000 mg | ORAL_TABLET | Freq: Three times a day (TID) | ORAL | Status: DC | PRN

## 2018-10-29 MED ORDER — LABETALOL HCL 5 MG/ML IV SOLN
10.0000 mg | INTRAVENOUS | Status: DC | PRN
  Administered 2018-10-29: 20 mg via INTRAVENOUS
  Administered 2018-10-29 (×2): 10 mg via INTRAVENOUS
  Filled 2018-10-29 (×2): qty 4

## 2018-10-29 MED ORDER — HYDRALAZINE HCL 20 MG/ML IJ SOLN
20.0000 mg | INTRAMUSCULAR | Status: DC | PRN
  Administered 2018-10-29: 20 mg via INTRAVENOUS
  Filled 2018-10-29 (×2): qty 1

## 2018-10-29 MED ORDER — ATORVASTATIN CALCIUM 40 MG PO TABS
40.0000 mg | ORAL_TABLET | Freq: Every day | ORAL | Status: DC
  Administered 2018-10-29: 40 mg via ORAL
  Filled 2018-10-29: qty 1

## 2018-10-29 MED ORDER — GABAPENTIN 300 MG PO CAPS
300.0000 mg | ORAL_CAPSULE | Freq: Three times a day (TID) | ORAL | Status: DC
  Administered 2018-10-29 – 2018-11-03 (×16): 300 mg via ORAL
  Filled 2018-10-29 (×16): qty 1

## 2018-10-29 MED ORDER — AMLODIPINE BESYLATE 10 MG PO TABS
10.0000 mg | ORAL_TABLET | Freq: Every day | ORAL | Status: DC
  Administered 2018-10-29 – 2018-11-03 (×6): 10 mg via ORAL
  Filled 2018-10-29: qty 2
  Filled 2018-10-29 (×5): qty 1

## 2018-10-29 MED ORDER — INSULIN ASPART 100 UNIT/ML ~~LOC~~ SOLN
0.0000 [IU] | Freq: Three times a day (TID) | SUBCUTANEOUS | Status: DC
  Administered 2018-10-29: 3 [IU] via SUBCUTANEOUS
  Administered 2018-10-29: 5 [IU] via SUBCUTANEOUS
  Administered 2018-10-30: 2 [IU] via SUBCUTANEOUS
  Administered 2018-10-30: 5 [IU] via SUBCUTANEOUS
  Administered 2018-10-30: 3 [IU] via SUBCUTANEOUS
  Filled 2018-10-29: qty 1

## 2018-10-29 MED ORDER — SODIUM CHLORIDE 0.9 % IV SOLN
2.0000 g | Freq: Two times a day (BID) | INTRAVENOUS | Status: DC
  Administered 2018-10-29 – 2018-11-03 (×11): 2 g via INTRAVENOUS
  Filled 2018-10-29 (×2): qty 2
  Filled 2018-10-29: qty 20
  Filled 2018-10-29 (×5): qty 2
  Filled 2018-10-29: qty 20
  Filled 2018-10-29 (×2): qty 2
  Filled 2018-10-29: qty 20
  Filled 2018-10-29: qty 2

## 2018-10-29 MED ORDER — INSULIN ASPART 100 UNIT/ML ~~LOC~~ SOLN
3.0000 [IU] | Freq: Once | SUBCUTANEOUS | Status: AC
  Administered 2018-10-29: 3 [IU] via SUBCUTANEOUS

## 2018-10-29 MED ORDER — ACETAMINOPHEN 325 MG PO TABS
650.0000 mg | ORAL_TABLET | Freq: Four times a day (QID) | ORAL | Status: DC | PRN
  Administered 2018-10-30 – 2018-10-31 (×5): 650 mg via ORAL
  Filled 2018-10-29 (×5): qty 2

## 2018-10-29 MED ORDER — POLYSACCHARIDE IRON COMPLEX 150 MG PO CAPS
150.0000 mg | ORAL_CAPSULE | Freq: Two times a day (BID) | ORAL | Status: DC
  Administered 2018-10-29 – 2018-11-03 (×10): 150 mg via ORAL
  Filled 2018-10-29 (×13): qty 1

## 2018-10-29 MED ORDER — ONDANSETRON HCL 4 MG PO TABS: 4.0000 mg | ORAL_TABLET | Freq: Four times a day (QID) | ORAL | Status: DC | PRN
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
## 2018-10-29 NOTE — ED Notes (Signed)
Lumbar Puncture was unsuccessful. Acyclovir IV has been started.

## 2018-10-29 NOTE — ED Notes (Signed)
CBG 233 

## 2018-10-29 NOTE — ED Notes (Signed)
Date and time results received: 10/29/18 2:18 AM   Test: lactic acid Critical Value: 2.0  Name of Provider Notified: Dr. Alcario Drought   Orders Received? Or Actions Taken?: waiting for orders; will continue to monitor patient.

## 2018-10-29 NOTE — Progress Notes (Signed)
PHARMACY - PHYSICIAN COMMUNICATION CRITICAL VALUE ALERT - BLOOD CULTURE IDENTIFICATION (BCID)  [name] is an 63 y.o. female who presented to [facility] on 10/28/2018.  Assessment:  Patient is currently being treated for possible meningitis BCID call for 1/4 staphlococcus species, mecA NOT detected.   Name of physician (or Provider) Contacted: [facility] Floor coverage - [place]. Text page.  Current antibiotics: acyclovir, vancomycin, ceftriaxone 2 g IV q12h  Changes to prescribed antibiotics recommended: No changes to current ABX - covered by current antibiotics.  Results for orders placed or performed during the hospital encounter of 10/28/18  Blood Culture ID Panel (Reflexed) (Collected: 10/28/2018  8:37 PM)  Result Value Ref Range   Enterococcus species NOT DETECTED NOT DETECTED   Listeria monocytogenes NOT DETECTED NOT DETECTED   Staphylococcus species DETECTED (A) NOT DETECTED   Staphylococcus aureus (BCID) NOT DETECTED NOT DETECTED   Methicillin resistance NOT DETECTED NOT DETECTED   Streptococcus species NOT DETECTED NOT DETECTED   Streptococcus agalactiae NOT DETECTED NOT DETECTED   Streptococcus pneumoniae NOT DETECTED NOT DETECTED   Streptococcus pyogenes NOT DETECTED NOT DETECTED   Acinetobacter baumannii NOT DETECTED NOT DETECTED   Enterobacteriaceae species NOT DETECTED NOT DETECTED   Enterobacter cloacae complex NOT DETECTED NOT DETECTED   Escherichia coli NOT DETECTED NOT DETECTED   Klebsiella oxytoca NOT DETECTED NOT DETECTED   Klebsiella pneumoniae NOT DETECTED NOT DETECTED   Proteus species NOT DETECTED NOT DETECTED   Serratia marcescens NOT DETECTED NOT DETECTED   Haemophilus influenzae NOT DETECTED NOT DETECTED   Neisseria meningitidis NOT DETECTED NOT DETECTED   Pseudomonas aeruginosa NOT DETECTED NOT DETECTED   Candida albicans NOT DETECTED NOT DETECTED   Candida glabrata NOT DETECTED NOT DETECTED   Candida krusei NOT DETECTED NOT DETECTED   Candida  parapsilosis NOT DETECTED NOT DETECTED   Candida tropicalis NOT DETECTED NOT [place], PharmD 10/29/2018  8:16 PM

## 2018-10-29 NOTE — Progress Notes (Signed)
Inpatient Diabetes Program Recommendations  AACE/ADA: New Consensus Statement on Inpatient Glycemic Control (2015)  Target Ranges:  Prepandial:   less than 140 mg/dL      Peak postprandial:   less than 180 mg/dL (1-2 hours)      Critically ill patients:  140 - 180 mg/dL   Lab Results  Component Value Date   GLUCAP 233 (H) 10/29/2018    Review of Glycemic Control  Diabetes history: DM2 Outpatient Diabetes medications: metformin 1000 mg QD, glyburide 5 mg bid Current orders for Inpatient glycemic control: Novolog 0-15 units tidwc  Inpatient Diabetes Program Recommendations:     HgbA1C to assess glycemic control PTA.  Continue to follow.  Thank you. Lorenda Peck, RD, LDN, CDE Inpatient Diabetes Coordinator [phone]

## 2018-10-29 NOTE — ED Notes (Signed)
Patient transported to MRI 

## 2018-10-29 NOTE — Progress Notes (Addendum)
PROGRESS NOTE  [name] UEA:[id number] DOB: [birth year] DOA: 10/28/2018 PCP: Chesley Noon, MD  Brief History   63 year old woman presented with fever and altered mental status, headache, found on floor at home sitting next to couch.  Reported fall at home.  Initially admitted for SIRS, rhabdomyolysis, acute encephalopathy.  Subsequent MRI brain revealed infectious disease in the lateral ventricles most likely meningitis.  A & P  Acute meningitis, suspected, based on MRI findings, word finding difficulties, fever, acute encephalopathy, headache --Discussed with interpreting radiologist Dr. Pascal Lux, this most likely represents meningitis. Per Dr. Pascal Lux, there is no contraindication to LP based on imaging findings. --Adjust empiric antibiotics.  ID consultation, discussed with Dr. Megan Salon.  Add acyclovir given word finding difficulties.  Will send HSV PCR.  No Decadron at this point as the patient is already had antibiotics, per infectious disease.  --Discussed with Dr. 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 Leonel Ramsay with neurology, recommends LP and antibiotics, will evaluate later today for further recommendations.  Acute rhabdomyolysis --Renal function preserved.  Continue IV fluids, daily CK, BMP  Diabetes mellitus type 2 --Sliding scale insulin, follow CBG  Essential hypertension.  Currently hypertensive. --Continue Norvasc, labetalol as needed  CKD stage III appears to be stable --Trend BMP  PMH transsphenoidal pituitary resection 2006, breast cancer treated with chemotherapy, lumpectomy   Complex case, coordination of care as documented above.  Discussed with ED RN, LP to be performed prior to transfer.  Prognosis guarded.  DVT prophylaxis: SCDs Code Status: Full Family Communication: none present Disposition Plan: pending    Murray Hodgkins, MD  [facility] Direct contact: see [email or web address]  7PM-7AM contact night coverage as above 10/29/2018, 9:22 AM  LOS: 0 days   Consultants  . Infectious disease . Neurology  Procedures  . LP pending  Antibiotics  . Cefepime . Ceftriaxone 2/21 > . Vancomycin 2/21 >  Interval History/Subjective  Feels ok, no pain, no SOB, no numbness, no weakness, no complaints.  No neck pain.  Reports falling yesterday at home.  Reports being sick at home but does not elaborate.  Objective   Vitals:  Vitals:   10/29/18 0900 10/29/18 0915  BP: (!) 182/88 (!) 176/88  Pulse:  (!) 113  Resp: (!) 23 (!) 23  Temp:    SpO2:  98%    Exam:  Constitutional:  . Appears calm and comfortable  Eyes:  . pupils and irises appear normal . Left ptosis, patient reports at baseline . Extraocular movements intact ENMT:  . grossly normal hearing  . Lips appear normal Respiratory:  . CTA bilaterally, no w/r/r.  . Respiratory effort normal.  Cardiovascular:  . RRR, no m/r/g . No LE extremity edema   Abdomen:  . Obese, soft, nontender Musculoskeletal:  . RUE, LUE, RLE, LLE   o strength and tone normal, no atrophy, no abnormal movements o No tenderness, masses Skin:  . No rashes, lesions, ulcers . palpation of skin: no induration or nodules Neurologic:  . CN 2-12 grossly intact Sensation bilateral lower extremities intact No upper extremity dysdiadochokinesis No pronator drift Psychiatric:  . Mental status o Mood, affect appropriate . Oriented to person, Winter, hospital, not month or year  . judgment and insight difficult to judge   I have personally reviewed the following:   Today's Data  . CBG stable . CK 8227 . Lactic acid has returned to  normal . Procalcitonin 0.2 . Sodium 131, potassium 3.4, creatinine appears to be at baseline at 1.4 . WBC 11.6, hemoglobin stable at 10.4 . Influenza PCR negative . Urinalysis negative  Lab Data  . Reviewed  Micro Data    . Blood cultures pending  Imaging  . Chest x-ray independently reviewed no acute disease . CT head negative . MRI brain debris lateral ventricles primarily suspicious for infectious debris.  Suprasellar mass with abnormal findings.  Cardiology Data  . EKG sinus tachycardia, no acute changes.  Independently reviewed.  Other Data  .   Scheduled Meds: . amLODipine  10 mg Oral Daily  . atorvastatin  40 mg Oral Daily  . diclofenac  75 mg Oral BID  . gabapentin  300 mg Oral TID  . insulin aspart  0-15 Units Subcutaneous TID WC  . iron polysaccharides  150 mg Oral BID   Continuous Infusions: . sodium chloride 125 mL/hr at 10/29/18 0409  . cefTRIAXone (ROCEPHIN)  IV    . vancomycin      Principal Problem:   Acute purulent meningitis Active Problems:   HTN (hypertension)   Diabetes mellitus type II, non insulin dependent (HCC)   Acute encephalopathy   Rhabdomyolysis   History of pituitary adenoma   Atherosclerosis   CKD (chronic kidney disease), stage III ([facility])   LOS: 0 days     Critical care time [phone]

## 2018-10-29 NOTE — Progress Notes (Signed)
Pharmacy Antibiotic Note  [name] is a 63 y.o. female with fever and hyperglycemia admitted on 10/28/2018 with sepsis.  Pharmacy has been consulted for cefepime and vancomycin dosing.  Plan: Cefepime 2 Gm x1 then 1 Gm IV q12h Vancomycin 2 Gm x1 then 1500 mg IV q24h for est AUC = 494 Goal AUC = 400-550 Flagyl 500 mg IV q8h (MD) F/u scr/cultures/levels  Height: 5\' 6"  (167.6 cm) Weight: 247 lb (112 kg) IBW/kg (Calculated) : 59.3  Temp (24hrs), Avg:102.2 F (39 C), Min:102.2 F (39 C), Max:102.2 F (39 C)  Recent Labs  Lab 10/28/18 2037  WBC 11.6*  CREATININE 1.40*  LATICACIDVEN 2.0*    Estimated Creatinine Clearance: 54.2 mL/min (A) (by C-G formula based on SCr of 1.4 mg/dL (H)).    No Known Allergies  Antimicrobials this admission: 2/20 cefepime >>  2/20 flagyl >>  2/20 vancomycin >>  Dose adjustments this admission:   Microbiology results:  BCx:   UCx:    Sputum:    MRSA PCR:   Thank you for allowing pharmacy to be a part of this patient's care.  Dorrene German 10/29/2018 1:44 AM

## 2018-10-29 NOTE — Consult Note (Signed)
Reason for Consult: Abnormal MRI/meningitis Referring Physician: ER  [name] is an 63 y.o. female.  HPI: Presented with several days of confusion,  Headache, and fever 102 F.  She was found on the ground at home by family.  She was in Rhabdomyolysis with CK 11700 and Cr 1.21.  CT Brain showed no blood but evidence of prior transsphenoidal surgery for pituitary tumor in 2006.  MRI Brain shows a large 23 mm pituitary tumor with pressure on optic chiasm, although patient does not endorse vision issues.  There is global atrophy with mild ventricular enlargement.  There was some debris in the occipital horns of lateral ventricles suggestive of possible infection.  LP has been done and results pending.  She is receiving antibiotics and antiviral treatments empirically.  Influenza A/B negative.  Past Medical History:  Diagnosis Date  . Breast cancer ([place]) 12/29/13   right 6:00 o'clock, lower outer  . Diabetes mellitus   . Family history of malignant neoplasm of breast   . Glaucoma   . High cholesterol   . History of radiation therapy 08/12/14- 10/11/14   right breast /50.4 Gy/28 fx, right breast boost/ 10 Gy/ 5 fx  . HTN (hypertension)   . Personal history of chemotherapy   . Personal history of radiation therapy   . Wears dentures    full top-partial bottom  . Wears glasses     Past Surgical History:  Procedure Laterality Date  . ABDOMINAL HYSTERECTOMY  1993   endometriosis  . BREAST BIOPSY Left 12/29/2013  . BREAST LUMPECTOMY Right 02/08/2014  . BREAST LUMPECTOMY WITH AXILLARY LYMPH NODE BIOPSY Right 02/08/14   invasive ductal  . PORTACATH PLACEMENT Left 02/08/2014   Procedure: INSERTION PORT-A-CATH;  Surgeon: Rolm Bookbinder, MD;  Location: [facility];  Service: General;  Laterality: Left;  . TONSILLECTOMY    . TRANSPHENOIDAL PITUITARY RESECTION  2006    Family History  Problem Relation Age of Onset  . Hypertension Mother   . Diabetes type II Mother   . Breast  cancer Mother        dx ~55; deceased 8  . Hypertension Father   . Breast cancer Maternal Aunt        deceased 61  . Cancer Maternal Uncle        unk. type; deceased 37s  . Cancer Maternal Uncle        unk. type; deceased late 48s  . Uterine cancer Cousin 65       daughter of an unaffected mat aunt who is 14    Social History:  reports that she quit smoking about 24 years ago. Her smoking use included cigarettes. She does not have any smokeless tobacco history on file. She reports current alcohol use. She reports that she does not use drugs.  Allergies: No Known Allergies  Prior to Admission medications   Medication Sig Start Date End Date Taking? Authorizing Provider  amLODipine (NORVASC) 10 MG tablet Take 10 mg by mouth daily.  03/15/14  Yes [provider]  amoxicillin (AMOXIL) 875 MG tablet Take 875 mg by mouth 2 (two) times daily.   Yes [provider]  atorvastatin (LIPITOR) 40 MG tablet Take 40 mg by mouth daily.   Yes [provider]  diclofenac (VOLTAREN) 75 MG EC tablet Take 75 mg by mouth 2 (two) times daily.   Yes [provider]  FERREX 150 150 MG capsule Take 150 mg by mouth 2 (two) times daily. 09/23/18  Yes [provider]  gabapentin (NEURONTIN) 300 MG capsule TAKE 1 CAPSULE BY MOUTH AT BEDTIME Patient taking differently: Take 300 mg by mouth 3 (three) times daily.  04/30/18  Yes Magrinat, Virgie Dad, MD  glyBURIDE (DIABETA) 5 MG tablet Take 5 mg by mouth 2 (two) times daily with a meal.    Yes Chesley Noon, MD  lisinopril-hydrochlorothiazide (PRINZIDE,ZESTORETIC) 20-12.5 MG per tablet Take 2 tablets by mouth daily.  12/25/13  Yes Chesley Noon, MD  metFORMIN (GLUCOPHAGE-XR) 500 MG 24 hr tablet Take 1,000 mg by mouth daily with breakfast.  12/25/13  Yes Chesley Noon, MD  tiZANidine (ZANAFLEX) 4 MG tablet Take 4 mg by mouth every 8 (eight) hours as needed for muscle spasms.   Yes [provider]  Vitamin D,  Ergocalciferol, (DRISDOL) 1.25 MG (50000 UT) CAPS capsule Take 50,000 Units by mouth once a week. 09/23/18  Yes [provider]  glucose monitoring kit (FREESTYLE) monitoring kit One Touch Ultra machine and strips, patient tests her blood sugars bid 04/28/14   [provider]  ONE TOUCH ULTRA TEST test strip  03/16/14   [provider]    Medications:  Scheduled: . amLODipine  10 mg Oral Daily  . atorvastatin  40 mg Oral Daily  . diclofenac  75 mg Oral BID  . gabapentin  300 mg Oral TID  . insulin aspart  0-15 Units Subcutaneous TID WC  . iron polysaccharides  150 mg Oral BID WC    Results for orders placed or performed during the hospital encounter of 10/28/18 (from the past 48 hour(s))  CBG monitoring, ED     Status: Abnormal   Collection Time: 10/28/18  8:28 PM  Result Value Ref Range   Glucose-Capillary 323 (H) 70 - 99 mg/dL  Lactic acid, plasma     Status: Abnormal   Collection Time: 10/28/18  8:37 PM  Result Value Ref Range   Lactic Acid, Venous 2.0 (HH) 0.5 - 1.9 mmol/L    Comment: CRITICAL RESULT CALLED TO, READ BACK BY AND VERIFIED WITH: K.GARCIA AT 2147 ON 10/28/18 BY N.THOMPSON Performed at [facility], [place] [address] [place], [place] [address]   Comprehensive metabolic panel     Status: Abnormal   Collection Time: 10/28/18  8:37 PM  Result Value Ref Range   Sodium 131 (L) 135 - 145 mmol/L   Potassium 3.4 (L) 3.5 - 5.1 mmol/L   Chloride 100 98 - 111 mmol/L   CO2 18 (L) 22 - 32 mmol/L   Glucose, Bld 338 (H) 70 - 99 mg/dL   BUN 14 6 - 20 mg/dL   Creatinine, Ser 1.40 (H) 0.44 - 1.00 mg/dL   Calcium 8.9 8.9 - 10.3 mg/dL   Total Protein 8.2 (H) 6.5 - 8.1 g/dL   Albumin 4.1 3.5 - 5.0 g/dL   AST 41 15 - 41 U/L   ALT 25 0 - 44 U/L   Alkaline Phosphatase 88 38 - 126 U/L   Total Bilirubin 0.7 0.3 - 1.2 mg/dL   GFR calc non Af Amer 41 (L) >60 mL/min   GFR calc Af Amer 47 (L) >60 mL/min   Anion gap 13 5 - 15    Comment:  Performed at [facility], [place] [address], [place], [place] [address]  CBC WITH DIFFERENTIAL     Status: Abnormal   Collection Time: 10/28/18  8:37 PM  Result Value Ref Range   WBC 11.6 (H) 4.0 -  10.5 K/uL   RBC 3.74 (L) 3.87 - 5.11 MIL/uL   Hemoglobin 10.4 (L) 12.0 - 15.0 g/dL   HCT 33.4 (L) 36.0 - 46.0 %   MCV 89.3 80.0 - 100.0 fL   MCH 27.8 26.0 - 34.0 pg   MCHC 31.1 30.0 - 36.0 g/dL   RDW 14.6 11.5 - 15.5 %   Platelets 243 150 - 400 K/uL   nRBC 0.0 0.0 - 0.2 %   Neutrophils Relative % 77 %    Comment: VACUOLATED NEUTROPHILS   Neutro Abs 9.0 (H) 1.7 - 7.7 K/uL   Lymphocytes Relative 14 %   Lymphs Abs 1.6 0.7 - 4.0 K/uL   Monocytes Relative 8 %   Monocytes Absolute 0.9 0.1 - 1.0 K/uL   Eosinophils Relative 0 %   Eosinophils Absolute 0.0 0.0 - 0.5 K/uL   Basophils Relative 0 %   Basophils Absolute 0.0 0.0 - 0.1 K/uL   WBC Morphology MILD LEFT SHIFT (1-5% METAS, OCC MYELO, OCC BANDS)    Immature Granulocytes 1 %   Abs Immature Granulocytes 0.07 0.00 - 0.07 K/uL   Dohle Bodies PRESENT     Comment: Performed at [facility], [place] [address] [place], [place] [address]  Blood Culture (routine x 2)     Status: None (Preliminary result)   Collection Time: 10/28/18  8:37 PM  Result Value Ref Range   Specimen Description BLOOD LEFT ANTECUBITAL    Special Requests      BOTTLES DRAWN AEROBIC AND ANAEROBIC Blood Culture adequate volume Performed at [facility] [address], [place], [place] [address]    Culture NO GROWTH < 12 HOURS    Report Status PENDING   Blood Culture (routine x 2)     Status: None (Preliminary result)   Collection Time: 10/28/18  9:13 PM  Result Value Ref Range   Specimen Description BLOOD RIGHT WRIST    Special Requests      BOTTLES DRAWN AEROBIC AND ANAEROBIC Blood Culture adequate volume Performed at [facility] [address], [place], [place] [address]    Culture NO GROWTH  < 12 HOURS    Report Status PENDING   Urinalysis, Routine w reflex microscopic     Status: Abnormal   Collection Time: 10/28/18 10:27 PM  Result Value Ref Range   Color, Urine YELLOW YELLOW   APPearance CLEAR CLEAR   Specific Gravity, Urine 1.008 1.005 - 1.030   pH 6.0 5.0 - 8.0   Glucose, UA >=500 (A) NEGATIVE mg/dL   Hgb urine dipstick LARGE (A) NEGATIVE   Bilirubin Urine NEGATIVE NEGATIVE   Ketones, ur 5 (A) NEGATIVE mg/dL   Protein, ur 100 (A) NEGATIVE mg/dL   Nitrite NEGATIVE NEGATIVE   Leukocytes,Ua NEGATIVE NEGATIVE   RBC / HPF 0-5 0 - 5 RBC/hpf   WBC, UA 0-5 0 - 5 WBC/hpf   Bacteria, UA NONE SEEN NONE SEEN    Comment: Performed at [facility], [place] [address], [place], [place] [address]  Influenza panel by PCR (type A & B)     Status: None   Collection Time: 10/28/18 11:57 PM  Result Value Ref Range   Influenza A By PCR NEGATIVE NEGATIVE   Influenza B By PCR NEGATIVE NEGATIVE    Comment: (NOTE) The Xpert Xpress Flu assay is intended as an aid in the diagnosis of  influenza and should not be used as a sole basis for treatment.  This  assay  is FDA approved for nasopharyngeal swab specimens only. Nasal  washings and aspirates are unacceptable for Xpert Xpress Flu testing. Performed at [facility], [place] [address] [place], [place] [address]   CK     Status: Abnormal   Collection Time: 10/29/18  1:41 AM  Result Value Ref Range   Total CK 8,227 (H) 38 - 234 U/L    Comment: RESULTS CONFIRMED BY MANUAL DILUTION Performed at [facility], [place] [address], [place], [place] [address]   Lactic acid, plasma     Status: Abnormal   Collection Time: 10/29/18  1:41 AM  Result Value Ref Range   Lactic Acid, Venous 2.0 (HH) 0.5 - 1.9 mmol/L    Comment: CRITICAL RESULT CALLED TO, READ BACK BY AND VERIFIED WITH: T HAMLET RN [id number] 10/29/18 A NAVARRO Performed at [facility], [place] [address] [place],  [place] [address]   Procalcitonin     Status: None   Collection Time: 10/29/18  1:41 AM  Result Value Ref Range   Procalcitonin 0.20 ng/mL    Comment:        Interpretation: PCT (Procalcitonin) <= 0.5 ng/mL: Systemic infection (sepsis) is not likely. Local bacterial infection is possible. (NOTE)       Sepsis PCT Algorithm           Lower Respiratory Tract                                      Infection PCT Algorithm    ----------------------------     ----------------------------         PCT < 0.25 ng/mL                PCT < 0.10 ng/mL         Strongly encourage             Strongly discourage   discontinuation of antibiotics    initiation of antibiotics    ----------------------------     -----------------------------       PCT 0.25 - 0.50 ng/mL            PCT 0.10 - 0.25 ng/mL               OR       >80% decrease in PCT            Discourage initiation of                                            antibiotics      Encourage discontinuation           of antibiotics    ----------------------------     -----------------------------         PCT >= 0.50 ng/mL              PCT 0.26 - 0.50 ng/mL               AND        <80% decrease in PCT             Encourage initiation of  antibiotics       Encourage continuation           of antibiotics    ----------------------------     -----------------------------        PCT >= 0.50 ng/mL                  PCT > 0.50 ng/mL               AND         increase in PCT                  Strongly encourage                                      initiation of antibiotics    Strongly encourage escalation           of antibiotics                                     -----------------------------                                           PCT <= 0.25 ng/mL                                                 OR                                        > 80% decrease in PCT                                     Discontinue / Do  not initiate                                             antibiotics Performed at [facility] [address], [place], [place] [address]   CBG monitoring, ED     Status: Abnormal   Collection Time: 10/29/18  3:09 AM  Result Value Ref Range   Glucose-Capillary 237 (H) 70 - 99 mg/dL  Lactic acid, plasma     Status: None   Collection Time: 10/29/18  6:09 AM  Result Value Ref Range   Lactic Acid, Venous 1.7 0.5 - 1.9 mmol/L    Comment: Performed at [facility], [place] [address], [place], [place] [address]  CBG monitoring, ED     Status: Abnormal   Collection Time: 10/29/18  8:28 AM  Result Value Ref Range   Glucose-Capillary 261 (H) 70 - 99 mg/dL  CK     Status: Abnormal   Collection Time: 10/29/18 10:00 AM  Result Value Ref Range   Total CK 11,700 (H) 38 - 234 U/L    Comment: RESULTS CONFIRMED BY MANUAL DILUTION Performed at [facility], [place] [address] [place], [place] [address]   CBC     Status: Abnormal   Collection Time: 10/29/18 10:00 AM  Result Value Ref Range   WBC 11.9 (H) 4.0 - 10.5 K/uL   RBC 3.86 (L) 3.87 - 5.11 MIL/uL   Hemoglobin 10.7 (L) 12.0 - 15.0 g/dL   HCT 34.3 (L) 36.0 - 46.0 %   MCV 88.9 80.0 - 100.0 fL   MCH 27.7 26.0 - 34.0 pg   MCHC 31.2 30.0 - 36.0 g/dL   RDW 14.8 11.5 - 15.5 %   Platelets 210 150 - 400 K/uL   nRBC 0.0 0.0 - 0.2 %    Comment: Performed at [facility], [place] [address], [place], [place] [address]  Basic metabolic panel     Status: Abnormal   Collection Time: 10/29/18 10:00 AM  Result Value Ref Range   Sodium 134 (L) 135 - 145 mmol/L   Potassium 3.2 (L) 3.5 - 5.1 mmol/L   Chloride 101 98 - 111 mmol/L   CO2 23 22 - 32 mmol/L   Glucose, Bld 270 (H) 70 - 99 mg/dL   BUN 13 6 - 20 mg/dL   Creatinine, Ser 1.21 (H) 0.44 - 1.00 mg/dL   Calcium 9.0 8.9 - 10.3 mg/dL   GFR calc non Af Amer 49 (L) >60 mL/min   GFR calc Af Amer 56 (L) >60 mL/min   Anion gap 10 5 - 15     Comment: Performed at [facility], [place] [address], [place], [place] [address]  CBG monitoring, ED     Status: Abnormal   Collection Time: 10/29/18 11:10 AM  Result Value Ref Range   Glucose-Capillary 233 (H) 70 - 99 mg/dL    Dg Chest 2 View  Result Date: 10/28/2018 CLINICAL DATA:  Fever and congestion EXAM: CHEST - 2 VIEW COMPARISON:  02/08/2014 FINDINGS: The heart size and mediastinal contours are within normal limits. Both lungs are clear. Aortic atherosclerosis. Degenerative changes of the spine. IMPRESSION: No active cardiopulmonary disease. Electronically Signed   By: Donavan Foil M.D.   On: 10/28/2018 21:40   Ct Head Wo Contrast  Result Date: 10/29/2018 CLINICAL DATA:  Ataxia EXAM: CT HEAD WITHOUT CONTRAST TECHNIQUE: Contiguous axial images were obtained from the base of the skull through the vertex without intravenous contrast. COMPARISON:  None. FINDINGS: The examination is degraded by motion. Brain: There is no mass, hemorrhage or extra-axial collection. There is generalized atrophy without lobar predilection. Hypodensity of the white matter is most commonly associated with chronic microvascular disease. Sella is expanded but otherwise poorly visualized due to motion and streak artifact. Vascular: Atherosclerotic calcification of the internal carotid arteries at the skull base. No abnormal hyperdensity of the major intracranial arteries or dural venous sinuses. Skull: The visualized skull base, calvarium and extracranial soft tissues are normal. Sinuses/Orbits: Prior transsphenoidal surgery. No mastoid or middle ear effusion. The orbits are normal. IMPRESSION: 1. Motion degraded examination. 2. No acute intracranial abnormality. 3. Expanded sella and findings of prior transphenoidal surgery. Electronically Signed   By: Ulyses Jarred M.D.   On: 10/29/2018 02:08   Mr Brain Wo Contrast  Result Date: 10/29/2018 CLINICAL DATA:  Ataxia with stroke suspected EXAM: MRI HEAD  WITHOUT CONTRAST TECHNIQUE: Multiplanar, multiecho pulse sequences of the brain and surrounding structures were obtained without intravenous contrast. COMPARISON:  Head CT from earlier today FINDINGS: Brain: There is diffusion hyperintense material layering within the occipital horns of the lateral ventricles.  Mild ventriculomegaly without visualized obstructive process. No sulcal debris is seen. No susceptibility artifact to suggest blood products. No infarct, intra-axial masslike finding, or collection. Sellar mass that is partially T1 hyperintense, up to 2.3 cm. The sella is expanded and there is mass effect on the chiasm. By prior CT there may have been prior transsphenoidal hypophysectomy. Vascular: Major flow voids are preserved Skull and upper cervical spine: Negative for marrow lesion Sinuses/Orbits: Nasal septum defect which may be postsurgical given the above. A call has been placed to the ordering provider. The patient is already being treated empirically for meningitis. IMPRESSION: 1. Debris layering in the occipital horns of the lateral ventricles, primarily suspicious for infectious debris. This is not acute blood products by prior CT. 2. Mild lateral ventriculomegaly, recommend follow-up. 3. Sellar and suprasellar mass impacting the chiasm, compatible with pituitary adenoma. Please correlate with surgical history as there has likely been trans-sphenoidal hypophysectomy. The mass shows areas of T1 shortening that could be proteinaceous cavities but need correlation for symptoms of pituitary hemorrhage. Electronically Signed   By: Monte Fantasia M.D.   On: 10/29/2018 08:27    ROS Blood pressure (!) 185/91, pulse (!) 119, temperature (!) 102.2 F (39 C), temperature source Oral, resp. rate (!) 24, height _0  (1.676 m), weight 112 kg, SpO2 100 %. Neurologic Examination:  Awake, alert. Fully oriented. Language-fluent. Comprehension, naming, repetition- intact. Face symmetric.  Left eye  ptosis (congenital).   Tongue midline. Strength 5/5 BUE and BLE. Coord- FTN intact bilaterally. Sensory - intact. Visual fields - intact all 4 quadrants OU with confrontation. Moderate neck stiffness, but no pain with attempted neck flexion.    Negative Kernig's and Brudzinski signs. No babinski.   Assessment/Plan:  1. Possible meningitis based on history and MRI findings- awaiting CSF results.  She is being treated empirically and doing well clinically.  2. Pituitary tumor - if resection was complete after last surgery which patient indicates, then she has had recurrence.  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  Neurosurgery can discuss options for re-surgery once her acute illness is successfully treated.  3. Rhabdomyolysis - improving.  Rogue Jury, MS, MD 10/29/2018, 12:30 PM

## 2018-10-29 NOTE — Consult Note (Signed)
[facility] for Infectious Disease    Date of Admission:  10/28/2018           Day 1 vancomycin        Day 1 ceftriaxone        Day 1 acyclovir       Reason for Consult: Possible acute meningoencephalitis    Referring Provider: Dr. Carlean Jews  Assessment: I agree with the concern for acute meningoencephalitis.  Her puncture is pending.  I agree with empiric vancomycin, ceftriaxone and acyclovir for now.  Plan: 1. Continue current antibiotics pending results of lumbar puncture  Principal Problem:   Acute encephalopathy Active Problems:   Headache   Rhabdomyolysis   Fever   HTN (hypertension)   Diabetes mellitus type II, non insulin dependent (HCC)   History of pituitary adenoma   Atherosclerosis   CKD (chronic kidney disease), stage III (HCC)   Scheduled Meds: . amLODipine  10 mg Oral Daily  . atorvastatin  40 mg Oral Daily  . diclofenac  75 mg Oral BID  . gabapentin  300 mg Oral TID  . insulin aspart  0-15 Units Subcutaneous TID WC  . iron polysaccharides  150 mg Oral BID   Continuous Infusions: . sodium chloride 125 mL/hr at 10/29/18 0409  . acyclovir 800 mg (10/29/18 1053)  . cefTRIAXone (ROCEPHIN)  IV 2 g (10/29/18 1012)  . 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  Yesterday she fell off the couch and was unable to get up for several hours. Her family found her and noted slurred speech and lethargy and brought her to the emergency department.  She had a temperature of 102.2 degrees with tachycardia and hypertension.  She was noted to have some expressive a aphasia.  Lactic acid was slightly elevated.  Her CK enzyme was 8227.  UA and chest x-ray were negative.  Brain MRI showed previous evidence of transsphenoidal hypophysis ectomy for pituitary adenoma.   She was also noted to have debris in the lateral ventricles concerning for meningitis.   Review of Systems: Review of Systems  Unable to perform ROS: Mental acuity    Past Medical History:  Diagnosis Date  . Breast cancer ([place]) 12/29/13   right 6:00 o'clock, lower outer  . Diabetes mellitus   . Family history of malignant neoplasm of breast   . Glaucoma   . High cholesterol   . History of radiation therapy 08/12/14- 10/11/14   right breast /50.4 Gy/28 fx, right breast boost/ 10 Gy/ 5 fx  . HTN (hypertension)   . Personal history of chemotherapy   . Personal history of radiation therapy   . Wears dentures    full top-partial bottom  . Wears glasses     Social History   Tobacco Use  . Smoking status: Former Smoker    Types: Cigarettes    Last attempt to quit: 01/31/1994    Years since quitting: 24.7  Substance Use Topics  . Alcohol use: Yes    Comment: occ  . Drug use: No    Family History  Problem Relation Age of Onset  . Hypertension Mother   . Diabetes type II Mother   . Breast cancer Mother        dx ~16; deceased 4  . Hypertension  Father   . Breast cancer Maternal Aunt        deceased 83  . Cancer Maternal Uncle        unk. type; deceased 74s  . Cancer Maternal Uncle        unk. type; deceased late 96s  . Uterine cancer Cousin 43       daughter of an unaffected mat aunt who is 59   No Known Allergies  OBJECTIVE: Blood pressure (!) 188/86, pulse (!) 114, temperature (!) 102.2 F (39 C), temperature source Oral, resp. rate 18, height 5\' 6"  (1.676 m), weight 112 kg, SpO2 97 %.  Physical Exam Constitutional:      Comments: She is resting quietly in bed.  She is lethargic.  HENT:     Mouth/Throat:     Pharynx: No oropharyngeal exudate.  Eyes:     Conjunctiva/sclera: Conjunctivae normal.  Neck:     Musculoskeletal: Neck rigidity present.  Cardiovascular:     Rate and Rhythm: Normal rate and regular rhythm.     Heart sounds: No murmur.  Pulmonary:      Effort: Pulmonary effort is normal.     Breath sounds: Normal breath sounds.  Chest:    Abdominal:     Palpations: Abdomen is soft.     Tenderness: There is no abdominal tenderness.  Musculoskeletal:        General: No swelling or tenderness.  Skin:    Findings: No rash.  Neurological:     Comments: She has expressive aphasia.  I asked her how long she had been sick she said "several bottles".     Lab Results Lab Results  Component Value Date   WBC 11.9 (H) 10/29/2018   HGB 10.7 (L) 10/29/2018   HCT 34.3 (L) 10/29/2018   MCV 88.9 10/29/2018   PLT 210 10/29/2018    Lab Results  Component Value Date   CREATININE 1.21 (H) 10/29/2018   BUN 13 10/29/2018   NA 134 (L) 10/29/2018   K 3.2 (L) 10/29/2018   CL 101 10/29/2018   CO2 23 10/29/2018    Lab Results  Component Value Date   ALT 25 10/28/2018   AST 41 10/28/2018   ALKPHOS 88 10/28/2018   BILITOT 0.7 10/28/2018     Microbiology: Recent Results (from the past 240 hour(s))  Blood Culture (routine x 2)     Status: None (Preliminary result)   Collection Time: 10/28/18  8:37 PM  Result Value Ref Range Status   Specimen Description BLOOD LEFT ANTECUBITAL  Final   Special Requests   Final    BOTTLES DRAWN AEROBIC AND ANAEROBIC Blood Culture adequate volume Performed at [facility], [place] [address] [place], [place] [address]    Culture NO GROWTH < 12 HOURS  Final   Report Status PENDING  Incomplete  Blood Culture (routine x 2)     Status: None (Preliminary result)   Collection Time: 10/28/18  9:13 PM  Result Value Ref Range Status   Specimen Description BLOOD RIGHT WRIST  Final   Special Requests   Final    BOTTLES DRAWN AEROBIC AND ANAEROBIC Blood Culture adequate volume Performed at [facility] [address], [place], [place] [address]    Culture NO GROWTH < 12 HOURS  Final   Report Status PENDING  Incomplete    Michel Bickers, MD [facility]  [place] [phone] pager   [phone] cell 10/29/2018, 11:05 AM

## 2018-10-29 NOTE — Progress Notes (Signed)
Pharmacy Antibiotic Note  [name] is a 63 y.o. female with fever and hyperglycemia admitted on 10/28/2018 with sepsis.  Pharmacy has been consulted for cefepime and vancomycin dosing.  Update 2/21 AM: Meningitis had been low on DDx based on clinical presentation, however MRI now shows likely infection in lateral ventricals. ID, Neuro consulted; will perform LP with HSV PCR (vanc/cefepime already given); will change to Vanc/Acyclovir per Rx, plus Rocephin.  Plan:  Will adjust vancomycin to 1250 mg IV q24 hr (estimated AUC 593, Vtr 16, using SCr 1.4 and Vd 0.5)  Acyclovir IV 800 mg (10 mg/kg ABW) q8 hr, ID aware  Rocephin 2g IV q12 hr per MD, dosing appropriate  SCr q48 hr while on vanc/acyclovir  Check vanc AUC as appropriate  F/u LP results  Height: 5\' 6"  (167.6 cm) Weight: 247 lb (112 kg) IBW/kg (Calculated) : 59.3  Temp (24hrs), Avg:102.2 F (39 C), Min:102.2 F (39 C), Max:102.2 F (39 C)  Recent Labs  Lab 10/28/18 2037 10/29/18 0141 10/29/18 0609  WBC 11.6*  --   --   CREATININE 1.40*  --   --   LATICACIDVEN 2.0* 2.0* 1.7    Estimated Creatinine Clearance: 54.2 mL/min (A) (by C-G formula based on SCr of 1.4 mg/dL (H)).    No Known Allergies  Antimicrobials this admission: 2/20 cefepime >> 2/21 2/20 flagyl >> 2/21 2/20 vancomycin >> 2/21 Acyclovir >> 2/21 Rocephin >>  Dose adjustments this admission: 2/21 Adjust vanc dose for meningitis  Microbiology results: 2/20 BCx: ngtd 2/21 CSF: sent  Thank you for allowing pharmacy to be a part of this patient's care.  Reuel Boom, PharmD, BCPS [phone] 10/29/2018, 9:50 AM

## 2018-10-29 NOTE — Procedures (Signed)
Fluoro guided LP performed at L3-L4.  6 inch 20 gauge needle utilized.  Opening pressure 23 cm H2O.  12 mL cloudy yellow CSF obtained and sent to lab for analysis.  Please see dictation in PACS for full details.

## 2018-10-29 NOTE — ED Notes (Signed)
Patient transported to X-ray 

## 2018-10-29 NOTE — ED Notes (Signed)
Hospitalist at bedside 

## 2018-10-29 NOTE — ED Notes (Signed)
ED Provider at bedside. 

## 2018-10-29 NOTE — ED Provider Notes (Signed)
Called by hospitalist service for assistance with lumbar puncture for this 63 year old female with fever and/or sepsis of unclear source, altered mental status, MRI with suggestion of infection or meningitis.  Dr. Sarajane Jews of the hospital service explained that he had discussed the MRI findings with Dr. Leonel Ramsay of neurology as well as infectious disease, both teams recommending lumbar puncture.  Given that patient is still here in the ED, agreed to attempt lumbar puncture, described below.  Unfortunately unsuccessful attempt.  .Lumbar Puncture Date/Time: 10/29/2018 10:54 AM Performed by: Maudie Flakes, MD Authorized by: Maudie Flakes, MD   Unsuccessful attempt   Consent:    Consent obtained:  Verbal and written   Consent given by:  Patient and parent   Risks discussed:  Bleeding, infection, nerve damage and repeat procedure   Alternatives discussed:  No treatment Pre-procedure details:    Procedure purpose:  Diagnostic   Preparation: Patient was prepped and draped in usual sterile fashion   Anesthesia (see MAR for exact dosages):    Anesthesia method:  Local infiltration   Local anesthetic:  Lidocaine 1% w/o epi Procedure details:    Lumbar space:  L3-L4 interspace   Patient position:  R lateral decubitus   Needle gauge:  18   Number of attempts:  4 Post-procedure:    Patient tolerance of procedure:  Tolerated well, no immediate complications Comments:     Large degree of difficulty due to body habitus.  Still felt that the subarachnoid space was found, but unfortunately a dry tap.   Critical Care Documentation Critical care time provided by me (excluding procedures): 32 minutes  Condition necessitating critical care: Concern for sepsis and meningitis  Components of critical care management: reviewing of prior records, laboratory and imaging interpretation, frequent re-examination and reassessment of vital signs, discussion with consulting services, discussions with  family.      Maudie Flakes, MD 10/29/18 1058

## 2018-10-29 NOTE — H&P (Addendum)
History and Physical    [name]:[id number] DOB: [birth year] DOA: 10/28/2018  PCP: Eartha Inch, MD  Patient coming from: Home  I have personally briefly reviewed patient's old medical records in [facility]  Chief Complaint: AMS  HPI: [name] is a 63 y.o. female with medical history significant of HTN, BRCA, DM2.  Patient presents to the ED with AMS and fever.  Fever and headache for past 2 days.  N/V yesterday though none today she says.  Headache is throbbing, worse when she moves her head around.  No associated neck stiffness.  No cough, no abd pain, no dysuria, no back pain, no sore throat, no travel outside country.  Family couldn't get in touch with her all day so they called EMS.  She was found sitting on floor next to couch.  Lost balance and fell.   ED Course: Flu neg, CXR neg, CT head neg, UA neg.  Patient with slight mental slowing, but knows its Friday and its feb, doesn't know the exact date though.  BPs running very high: 180s-200s systolic.   Review of Systems: As per HPI otherwise 10 point review of systems negative.   Past Medical History:  Diagnosis Date  . Breast cancer (HCC) 12/29/13   right 6:00 o'clock, lower outer  . Diabetes mellitus   . Family history of malignant neoplasm of breast   . Glaucoma   . High cholesterol   . History of radiation therapy 08/12/14- 10/11/14   right breast /50.4 Gy/28 fx, right breast boost/ 10 Gy/ 5 fx  . HTN (hypertension)   . Personal history of chemotherapy   . Personal history of radiation therapy   . Wears dentures    full top-partial bottom  . Wears glasses     Past Surgical History:  Procedure Laterality Date  . ABDOMINAL HYSTERECTOMY  1993   endometriosis  . BREAST BIOPSY Left 12/29/2013  . BREAST LUMPECTOMY Right 02/08/2014  . BREAST LUMPECTOMY WITH AXILLARY LYMPH NODE BIOPSY Right 02/08/14   invasive ductal  . PORTACATH PLACEMENT Left 02/08/2014   Procedure: INSERTION  PORT-A-CATH;  Surgeon: Emelia Loron, MD;  Location: [facility];  Service: General;  Laterality: Left;  . TONSILLECTOMY    . TRANSPHENOIDAL PITUITARY RESECTION  2006     reports that she quit smoking about 24 years ago. Her smoking use included cigarettes. She does not have any smokeless tobacco history on file. She reports current alcohol use. She reports that she does not use drugs.  No Known Allergies  Family History  Problem Relation Age of Onset  . Hypertension Mother   . Diabetes type II Mother   . Breast cancer Mother        dx ~21; deceased 45  . Hypertension Father   . Breast cancer Maternal Aunt        deceased 97  . Cancer Maternal Uncle        unk. type; deceased 33s  . Cancer Maternal Uncle        unk. type; deceased late 48s  . Uterine cancer Cousin 48       daughter of an unaffected mat aunt who is 53     Prior to Admission medications   Medication Sig Start Date End Date Taking? Authorizing Provider  amLODipine (NORVASC) 10 MG tablet Take 10 mg by mouth daily.  03/15/14  Yes [provider]  amoxicillin (AMOXIL) 875 MG tablet Take 875 mg by mouth 2 (two)  times daily.   Yes [provider]  atorvastatin (LIPITOR) 40 MG tablet Take 40 mg by mouth daily.   Yes [provider]  diclofenac (VOLTAREN) 75 MG EC tablet Take 75 mg by mouth 2 (two) times daily.   Yes [provider]  FERREX 150 150 MG capsule Take 150 mg by mouth 2 (two) times daily. 09/23/18  Yes [provider]  gabapentin (NEURONTIN) 300 MG capsule TAKE 1 CAPSULE BY MOUTH AT BEDTIME Patient taking differently: Take 300 mg by mouth 3 (three) times daily.  04/30/18  Yes Magrinat, Valentino Hue, MD  glyBURIDE (DIABETA) 5 MG tablet Take 5 mg by mouth 2 (two) times daily with a meal.    Yes Eartha Inch, MD  lisinopril-hydrochlorothiazide (PRINZIDE,ZESTORETIC) 20-12.5 MG per tablet Take 2 tablets by mouth daily.  12/25/13  Yes Eartha Inch, MD    metFORMIN (GLUCOPHAGE-XR) 500 MG 24 hr tablet Take 1,000 mg by mouth daily with breakfast.  12/25/13  Yes Eartha Inch, MD  tiZANidine (ZANAFLEX) 4 MG tablet Take 4 mg by mouth every 8 (eight) hours as needed for muscle spasms.   Yes [provider]  Vitamin D, Ergocalciferol, (DRISDOL) 1.25 MG (50000 UT) CAPS capsule Take 50,000 Units by mouth once a week. 09/23/18  Yes [provider]  glucose monitoring kit (FREESTYLE) monitoring kit One Touch Ultra machine and strips, patient tests her blood sugars bid 04/28/14   [provider]  ONE TOUCH ULTRA TEST test strip  03/16/14   [provider]    Physical Exam: Vitals:   10/28/18 2330 10/29/18 0000 10/29/18 0030 10/29/18 0117  BP: (!) 192/95 (!) 200/99 (!) 198/93 (!) 233/117  Pulse: (!) 110   (!) 120  Resp: 20 (!) 32 18 19  Temp:      TempSrc:      SpO2: 97%   97%  Weight:      Height:        Constitutional: NAD, calm, comfortable Eyes: PERRL, lids and conjunctivae normal ENMT: Mucous membranes are moist. Posterior pharynx clear of any exudate or lesions.Normal dentition.  Neck: normal, supple, no meningismus, no masses, no thyromegaly Respiratory: clear to auscultation bilaterally, no wheezing, no crackles. Normal respiratory effort. No accessory muscle use.  Cardiovascular: Regular rate and rhythm, no murmurs / rubs / gallops. No extremity edema. 2+ pedal pulses. No carotid bruits.  Abdomen: no tenderness, no masses palpated. No hepatosplenomegaly. Bowel sounds positive.  Musculoskeletal: no clubbing / cyanosis. No joint deformity upper and lower extremities. Good ROM, no contractures. Normal muscle tone.  Skin: no rashes, lesions, ulcers. No induration Neurologic: grossly non-focal, patient is able to stand up (and tried to go to bathroom) but Gait unsteady (so she was put back in bed, no fall here). Psychiatric: Normal judgment and insight. Alert and oriented (misses exact date but knows its Feb  and a Friday). Normal mood.    Labs on Admission: I have personally reviewed following labs and imaging studies  CBC: Recent Labs  Lab 10/28/18 2037  WBC 11.6*  NEUTROABS 9.0*  HGB 10.4*  HCT 33.4*  MCV 89.3  PLT 243   Basic Metabolic Panel: Recent Labs  Lab 10/28/18 2037  NA 131*  K 3.4*  CL 100  CO2 18*  GLUCOSE 338*  BUN 14  CREATININE 1.40*  CALCIUM 8.9   GFR: Estimated Creatinine Clearance: 54.2 mL/min (A) (by C-G formula based on SCr of 1.4 mg/dL (H)). Liver Function Tests: Recent Labs  Lab 10/28/18 2037  AST 41  ALT 25  ALKPHOS 88  BILITOT 0.7  PROT 8.2*  ALBUMIN 4.1   No results for input(s): LIPASE, AMYLASE in the last 168 hours. No results for input(s): AMMONIA in the last 168 hours. Coagulation Profile: No results for input(s): INR, PROTIME in the last 168 hours. Cardiac Enzymes: No results for input(s): CKTOTAL, CKMB, CKMBINDEX, TROPONINI in the last 168 hours. BNP (last 3 results) No results for input(s): PROBNP in the last 8760 hours. HbA1C: No results for input(s): HGBA1C in the last 72 hours. CBG: Recent Labs  Lab 10/28/18 2028  GLUCAP 323*   Lipid Profile: No results for input(s): CHOL, HDL, LDLCALC, TRIG, CHOLHDL, LDLDIRECT in the last 72 hours. Thyroid Function Tests: No results for input(s): TSH, T4TOTAL, FREET4, T3FREE, THYROIDAB in the last 72 hours. Anemia Panel: No results for input(s): VITAMINB12, FOLATE, FERRITIN, TIBC, IRON, RETICCTPCT in the last 72 hours. Urine analysis:    Component Value Date/Time   COLORURINE YELLOW 10/28/2018 2227   APPEARANCEUR CLEAR 10/28/2018 2227   LABSPEC 1.008 10/28/2018 2227   PHURINE 6.0 10/28/2018 2227   GLUCOSEU >=500 (A) 10/28/2018 2227   HGBUR LARGE (A) 10/28/2018 2227   BILIRUBINUR NEGATIVE 10/28/2018 2227   KETONESUR 5 (A) 10/28/2018 2227   PROTEINUR 100 (A) 10/28/2018 2227   NITRITE NEGATIVE 10/28/2018 2227   LEUKOCYTESUR NEGATIVE 10/28/2018 2227    Radiological Exams on  Admission: Dg Chest 2 View  Result Date: 10/28/2018 CLINICAL DATA:  Fever and congestion EXAM: CHEST - 2 VIEW COMPARISON:  02/08/2014 FINDINGS: The heart size and mediastinal contours are within normal limits. Both lungs are clear. Aortic atherosclerosis. Degenerative changes of the spine. IMPRESSION: No active cardiopulmonary disease. Electronically Signed   By: Jasmine Pang M.D.   On: 10/28/2018 21:40    EKG: Independently reviewed.  Assessment/Plan Principal Problem:   SIRS (systemic inflammatory response syndrome) (HCC) Active Problems:   HTN (hypertension)   Diabetes mellitus type II, non insulin dependent (HCC)   Headache   Acute encephalopathy   Rhabdomyolysis    1. SIRS - 1. Possibly just viral syndrome? No obvious source. 1. EDP doesn't think patient needs LP, I agree, bacterial meningitis shouldn't present like this after 2 days. 2. Patient also declines LP anyhow 2. And on empiric cefepime and vanc for the moment anyhow 3. procalcitonin pending 4. Trend lactates 5. BCx pending 2. Rhabdo - 1. Got 3L of LR in the ED, and now her BP is in the 180s to 200s systolic 2. Does have rhabdo though, so will continue IVF at 125 cc/hr. 3. Strict intake and output 4. Repeat CPK in AM 5. Repeat BMP and CBC in AM 6. Creat 1.4 today, appears to be baseline for this patient 3. HTN - 1. Resume home Norvasc 2. Holding home lisinopril-HCTZ at least until we see rhabdo improve / AM kidney numbers (after which may wish to resume this later this morning). 3. PRN labetalol ordered 4. CT head reveals no bleed 4. DM2 - 1. Mod scale SSI AC 5. Acute encephalopathy - mild 1. Likely due to acute illness above 2. None the less given severe HTN, unsteady gait, will go ahead and get MRI brain to R/O stroke, though this seems less likely.  DVT prophylaxis: Lovenox Code Status: Full Family Communication: No family in room Disposition Plan: Home after admit Consults called: None Admission  status: Place in 59    Chico Cawood M. DO [facility]  How to contact the  [facility] Attending or Consulting provider 7A - 7P or covering provider during after hours 7P -7A, for this patient?  1. Check the care team in [facility] and look for a) attending/consulting [facility] provider listed and b) the [facility] team listed 2. Log into [email or web address]  Amion Physician Scheduling and messaging for groups and whole hospitals  On call and physician scheduling software for group practices, residents, hospitalists and other medical providers for call, clinic, rotation and shift schedules. On[employer] is a hospital-wide system for scheduling doctors and paging doctors on call. EasyPlot is for scientific plotting and data analysis.  [email or web address]  and use [place]'s universal password to access. If you do not have the password, please contact the hospital operator.  3. Locate the [facility] provider you are looking for under [facility] and page to a number that you can be directly reached. 4. If you still have difficulty reaching the provider, please page the [facility] (Director on Call) for the Hospitalists listed on amion for assistance.  10/29/2018, 1:40 AM

## 2018-10-30 ENCOUNTER — Encounter (HOSPITAL_COMMUNITY): Payer: Self-pay

## 2018-10-30 DIAGNOSIS — M6282 Rhabdomyolysis: Secondary | ICD-10-CM

## 2018-10-30 DIAGNOSIS — N179 Acute kidney failure, unspecified: Secondary | ICD-10-CM

## 2018-10-30 DIAGNOSIS — R6520 Severe sepsis without septic shock: Secondary | ICD-10-CM

## 2018-10-30 DIAGNOSIS — D352 Benign neoplasm of pituitary gland: Secondary | ICD-10-CM

## 2018-10-30 DIAGNOSIS — G009 Bacterial meningitis, unspecified: Secondary | ICD-10-CM

## 2018-10-30 DIAGNOSIS — E1165 Type 2 diabetes mellitus with hyperglycemia: Secondary | ICD-10-CM

## 2018-10-30 DIAGNOSIS — A419 Sepsis, unspecified organism: Secondary | ICD-10-CM

## 2018-10-30 HISTORY — DX: Benign neoplasm of pituitary gland: D35.2

## 2018-10-30 LAB — CBC
HCT: 32.6 % — ABNORMAL LOW (ref 36.0–46.0)
Hemoglobin: 9.2 g/dL — ABNORMAL LOW (ref 12.0–15.0)
MCH: 26.8 pg (ref 26.0–34.0)
MCHC: 28.2 g/dL — ABNORMAL LOW (ref 30.0–36.0)
MCV: 95 fL (ref 80.0–100.0)
Platelets: 205 10*3/uL (ref 150–400)
RBC: 3.43 MIL/uL — AB (ref 3.87–5.11)
RDW: 14.9 % (ref 11.5–15.5)
WBC: 9.1 10*3/uL (ref 4.0–10.5)
nRBC: 0 % (ref 0.0–0.2)

## 2018-10-30 LAB — GLUCOSE, CAPILLARY
Glucose-Capillary: 180 mg/dL — ABNORMAL HIGH (ref 70–99)
Glucose-Capillary: 230 mg/dL — ABNORMAL HIGH (ref 70–99)
Glucose-Capillary: 125 mg/dL — ABNORMAL HIGH (ref 70–99)
Glucose-Capillary: 198 mg/dL — ABNORMAL HIGH (ref 70–99)

## 2018-10-30 LAB — BASIC METABOLIC PANEL
Anion gap: 10 (ref 5–15)
BUN: 19 mg/dL (ref 6–20)
CHLORIDE: 105 mmol/L (ref 98–111)
CO2: 22 mmol/L (ref 22–32)
Calcium: 8.3 mg/dL — ABNORMAL LOW (ref 8.9–10.3)
Creatinine, Ser: 2.04 mg/dL — ABNORMAL HIGH (ref 0.44–1.00)
GFR calc Af Amer: 30 mL/min — ABNORMAL LOW (ref >60)
GFR calc non Af Amer: 26 mL/min — ABNORMAL LOW (ref >60)
Glucose, Bld: 183 mg/dL — ABNORMAL HIGH (ref 70–99)
Potassium: 3.2 mmol/L — ABNORMAL LOW (ref 3.5–5.1)
Sodium: 137 mmol/L (ref 135–145)

## 2018-10-30 LAB — HSV DNA BY PCR (REFERENCE LAB)
HSV 1 DNA: NEGATIVE
HSV 2 DNA: NEGATIVE

## 2018-10-30 LAB — CK: Total CK: 12215 U/L — ABNORMAL HIGH (ref 38–234)

## 2018-10-30 MED ORDER — ACETAMINOPHEN 500 MG PO TABS
1000.0000 mg | ORAL_TABLET | Freq: Once | ORAL | Status: AC
  Administered 2018-10-30: 1000 mg via ORAL
  Filled 2018-10-30: qty 2

## 2018-10-30 MED ORDER — VANCOMYCIN VARIABLE DOSE PER UNSTABLE RENAL FUNCTION (PHARMACIST DOSING): Status: DC

## 2018-10-30 MED ORDER — INSULIN ASPART 100 UNIT/ML ~~LOC~~ SOLN
0.0000 [IU] | Freq: Three times a day (TID) | SUBCUTANEOUS | Status: DC
  Administered 2018-10-31 – 2018-11-01 (×4): 3 [IU] via SUBCUTANEOUS
  Administered 2018-11-01: 2 [IU] via SUBCUTANEOUS
  Administered 2018-11-02: 3 [IU] via SUBCUTANEOUS
  Administered 2018-11-02: 2 [IU] via SUBCUTANEOUS
  Administered 2018-11-02: 3 [IU] via SUBCUTANEOUS
  Administered 2018-11-03: 2 [IU] via SUBCUTANEOUS
  Administered 2018-11-03: 3 [IU] via SUBCUTANEOUS

## 2018-10-30 MED ORDER — LABETALOL HCL 5 MG/ML IV SOLN
10.0000 mg | INTRAVENOUS | Status: DC | PRN
  Administered 2018-10-30 (×2): 10 mg via INTRAVENOUS
  Filled 2018-10-30 (×3): qty 4

## 2018-10-30 MED ORDER — SODIUM CHLORIDE 0.9 % IV SOLN
2.0000 g | Freq: Four times a day (QID) | INTRAVENOUS | Status: DC
  Administered 2018-10-30 – 2018-11-01 (×7): 2 g via INTRAVENOUS
  Filled 2018-10-30: qty 2
  Filled 2018-10-30: qty 2000
  Filled 2018-10-30 (×6): qty 2
  Filled 2018-10-30: qty 2000

## 2018-10-30 MED ORDER — HYDRALAZINE HCL 20 MG/ML IJ SOLN
20.0000 mg | INTRAMUSCULAR | Status: DC | PRN
  Administered 2018-10-30 (×2): 20 mg via INTRAVENOUS
  Filled 2018-10-30 (×2): qty 1

## 2018-10-30 MED ORDER — METOPROLOL TARTRATE 5 MG/5ML IV SOLN
5.0000 mg | Freq: Once | INTRAVENOUS | Status: AC
  Administered 2018-10-30: 5 mg via INTRAVENOUS
  Filled 2018-10-30: qty 5

## 2018-10-30 MED ORDER — VANCOMYCIN HCL IN DEXTROSE 750-5 MG/150ML-% IV SOLN: 750.0000 mg | INTRAVENOUS | Status: DC

## 2018-10-30 MED ORDER — DEXTROSE 5 % IV SOLN
800.0000 mg | Freq: Two times a day (BID) | INTRAVENOUS | Status: DC
  Administered 2018-10-30 – 2018-10-31 (×2): 800 mg via INTRAVENOUS
  Filled 2018-10-30 (×2): qty 16

## 2018-10-30 MED ORDER — INSULIN ASPART 100 UNIT/ML ~~LOC~~ SOLN: 0.0000 [IU] | Freq: Every day | SUBCUTANEOUS | Status: DC

## 2018-10-30 NOTE — Progress Notes (Signed)
Subjective: CSF consistent with meningitis, headache improved.   Exam: Vitals:   10/30/18 1200 10/30/18 1300  BP: (!) 123/92 140/76  Pulse: 99 97  Resp: 18 15  Temp:    SpO2: 97% 95%   Gen: In bed, NAD Resp: non-labored breathing, no acute distress Abd: soft, nt  Neuro: MS: awake, alert, interactive and appropriate LK:HVFMB, EOMI, VFF Motor: MAEW  Pertinent Labs: CSF 2000-4000, 82% neutrophils Glucose 84, though serum was around 300 at the time.  Cr 2.04  Impression: 63 yo F with CSF consistetn with bacterial meningitis. No growth, but given that she had ABX prior to LP,  This may not grow. Certainly, with the debris seen, I think that bacterial meningitis is by for the most likely culprit. Cytology is pending.   Recommendations: 1) Add ampicillin(discussed with ID) 2) f/u with neurosurgery after acute illness for tumor recurrance.  3) f/u cytology 4) Please call with further questions or concerns.   Roland Rack, MD [facility] [phone]  If 7pm- 7am, please page neurology on call as listed in [place].

## 2018-10-30 NOTE — Progress Notes (Addendum)
Pharmacy Antibiotic Note  [name] is a 63 y.o. female with fever and hyperglycemia admitted on 10/28/2018 with sepsis.  Pharmacy has been consulted for cefepime and vancomycin dosing.  Update 2/21 AM: Meningitis had been low on DDx based on clinical presentation, however MRI now shows likely infection in lateral ventricals. ID, Neuro consulted; will perform LP with HSV PCR (vanc/cefepime already given); will change to Vanc/Acyclovir per Rx, plus Rocephin.  Today, 10/30/18  Significant rise in SCr likely related to rhabdo per Md notes - SCr 2.04 CrCl 37  Afebrile  WBC improving  Plan:  Due to significant rise in SCr, will d/c vanc and check a vanc level tomorrow AM along with a SCr then reassess  Will change acyclovir IV from 800 mg (10 mg/kg ABW) q8 hr to q12 per current renal function  Continue Rocephin 2g IV q12 hr per MD, dosing appropriate  SCr q48 hr while on vanc/acyclovir  Check vanc AUC as appropriate  F/u LP results  Height: 5\' 6"  (167.6 cm) Weight: 247 lb (112 kg) IBW/kg (Calculated) : 59.3  Temp (24hrs), Avg:98.3 F (36.8 C), Min:97.7 F (36.5 C), Max:98.8 F (37.1 C)  Recent Labs  Lab 10/28/18 2037 10/29/18 0141 10/29/18 0609 10/29/18 1000 10/30/18 0303  WBC 11.6*  --   --  11.9* 9.1  CREATININE 1.40*  --   --  1.21* 2.04*  LATICACIDVEN 2.0* 2.0* 1.7  --   --     Estimated Creatinine Clearance: 37.2 mL/min (A) (by C-G formula based on SCr of 2.04 mg/dL (H)).    No Known Allergies  Antimicrobials this admission: 2/20 cefepime >> 2/21 2/20 flagyl >> 2/21 2/20 vancomycin >> 2/21 Acyclovir >> 2/21 Rocephin >>  Dose adjustments this admission: 2/21 Adjust vanc dose for meningitis  Microbiology results: 2/20 BCx: 1 of 4 bottles with CNS 2/21 CSF: sent - HSV PCR: pending 2/21 MRSA PCR: positive 2/21 CSF cx: ngtd  Thank you for allowing pharmacy to be a part of this patient's care.  Adrian Saran, PharmD, BCPS 10/30/2018 12:13  PM

## 2018-10-30 NOTE — Progress Notes (Signed)
Pharmacy Antibiotic Note  [name] is a 63 y.o. female with fever and hyperglycemia admitted on 10/28/2018 with sepsis.  Pharmacy has been consulted for cefepime and vancomycin dosing.  Update 2/21 AM: Meningitis had been low on DDx based on clinical presentation, however MRI now shows likely infection in lateral ventricals. ID, Neuro consulted; will perform LP with HSV PCR (vanc/cefepime already given); will change to Vanc/Acyclovir per Rx, plus Rocephin.   Today, 10/30/18  Significant rise in SCr likely related to rhabdo per Md notes - SCr 2.04 CrCl 37  Afebrile  WBC improving  Adding ampicillin per neuro  Plan:  Due to significant rise in SCr, will d/c vanc and check a vanc level tomorrow AM along with a SCr then reassess  Will change acyclovir IV from 800 mg (10 mg/kg ABW) q8 hr to q12 per current renal function  Continue Rocephin 2g IV q12 hr per MD, dosing appropriate  Add ampicillin IV 2gm IV q6h (adjusted for CrCl<35ml/min)  SCr q48 hr while on vanc/acyclovir  Check vanc AUC as appropriate  F/u LP results  Height: 5\' 6"  (167.6 cm) Weight: 247 lb (112 kg) IBW/kg (Calculated) : 59.3  Temp (24hrs), Avg:98.3 F (36.8 C), Min:97.7 F (36.5 C), Max:98.8 F (37.1 C)  Recent Labs  Lab 10/28/18 2037 10/29/18 0141 10/29/18 0609 10/29/18 1000 10/30/18 0303  WBC 11.6*  --   --  11.9* 9.1  CREATININE 1.40*  --   --  1.21* 2.04*  LATICACIDVEN 2.0* 2.0* 1.7  --   --     Estimated Creatinine Clearance: 37.2 mL/min (A) (by C-G formula based on SCr of 2.04 mg/dL (H)).    No Known Allergies  Antimicrobials this admission: 2/20 cefepime >> 2/21 2/20 flagyl >> 2/21 2/20 vancomycin >> 2/21 Acyclovir >> 2/21 Rocephin >> 2/22 Ampicillin  Dose adjustments this admission: 2/21 Adjust vanc dose for meningitis  Microbiology results: 2/20 BCx: 1 of 4 bottles with CNS 2/21 CSF: NGTD - HSV PCR: pending 2/21 MRSA PCR: positive 2/21 CSF cx: ngtd 2/20 BCx: 1/2  CoNS  Thank you for allowing pharmacy to be a part of this patient's care.  Netta Cedars, PharmD, BCPS 10/30/2018 3:10 PM

## 2018-10-30 NOTE — Progress Notes (Addendum)
PROGRESS NOTE  [name] GYK:[id number] DOB: 06-10-1956 DOA: 10/28/2018 PCP: Chesley Noon, MD  Brief History   63 year old woman presented with fever and altered mental status, headache, found on floor at home sitting next to couch.  Reported fall at home.  Initially admitted for SIRS, rhabdomyolysis, acute encephalopathy.  Subsequent MRI brain revealed infectious disease in the lateral ventricles most likely meningitis.  A & P  Sepsis secondary acute meningitis, suspect bacterial based on CSF studies, with acute encephalopathy, presented with word finding difficulties, fever, acute encephalopathy, headache. --Afebrile, hemodynamic stable.  Clinical status dramatically improved today.  Speech is fluent, clear and appropriate, no word finding difficulties or abnormal speech.  Neurologic exam is nonfocal other than chronic ptosis on the left. --CSF studies suggestive of bacterial meningitis.  Of note the patient does report having 1 dose of amoxicillin at home the day prior to admission.  Continue empiric antibiotics.  Follow-up culture data. --Follow-up HSV DNA PCR, however based on CSF studies doubt viral process.  1/2 gram-positive cocci bacteremia.  Coag negative staph, suspect contaminant.  Acute rhabdomyolysis, acute kidney injury superimposed on CKD stage III --Strict I/O not recorded.  Renal function worse today, suspect secondary to sepsis, rhabdomyolysis, CK --Aggressive IV fluids, trend BMP, bladder scan urinalysis was positive for hemoglobin, glucose.  Renal ultrasound tomorrow if not improved.   Diabetes mellitus type 2 --CBG stable, continue sliding scale insulin  Essential hypertension.  Currently hypertensive. --Stable.  Continue Norvasc.  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 --Follow-up with neurosurgery as an outpatient once acute illness has resolved.  PMH transsphenoidal pituitary resection 2006, breast cancer treated  with chemotherapy, lumpectomy   Medically improved.  Continue empiric antibiotics.  Follow-up culture data.  Appreciate ID and neurology.  DVT prophylaxis: SCDs Code Status: Full Family Communication: None present Disposition Plan: Likely home   Murray Hodgkins, MD  [facility] Direct contact: see [email or web address]  7PM-7AM contact night coverage as above 10/30/2018, 9:30 AM  LOS: 1 day   Consultants  . Infectious disease . Neurology  Procedures  . LP pending  Antibiotics  . Cefepime . Ceftriaxone 2/21 > . Vancomycin 2/21 > . Acyclovir 2/21 >  Interval History/Subjective  Feels well today.  Slight headache but otherwise no complaints.  Breathing well.  No nausea or vomiting.  Objective   Vitals:  Vitals:   10/30/18 0800 10/30/18 0900  BP: (!) 148/66 (!) 147/84  Pulse: 96 96  Resp: 20 17  Temp:    SpO2: 99% 98%    Exam:  Constitutional:   . Appears calm and comfortable, quite well appearing today Eyes:  . pupils and irises appear normal . Left ptosis noted ENMT:  . grossly normal hearing  . Lips appear normal Respiratory:  . CTA bilaterally, no w/r/r.  . Respiratory effort normal.  Cardiovascular:  . RRR, no m/r/g . No LE extremity edema   Abdomen:  . Soft Musculoskeletal:  . Digits/nails BUE: no clubbing, cyanosis, petechiae, infection . RUE, LUE, RLE, LLE   o strength and tone normal, no atrophy, no abnormal movements o No tenderness, masses Skin:  . No rashes, lesions, ulcers noted Neurologic:  . CN grossly intact, left ptosis chronic Psychiatric:  . Mental status o Mood, affect appropriate o Oriented to person, [facility], month, year . judgment and insight appear intact    I have personally reviewed the following:   Today's Data  . Creatinine up to 2.04.  Potassium 3.2.  Remainder BMP unremarkable. . Total CK slightly higher 12215 . Hemoglobin slightly lower at 9.2.  WBC now within normal limits. . CSF studies as  below  Lab Data  . Reviewed  Micro Data  . Blood cultures 1/2 gram-positive cocci . Fourth bottle CSF with 1000 RBC, 2322 WBC with 82% segs . CSF culture pending, no organisms seen  Imaging  . Chest x-ray independently reviewed no acute disease . CT head negative . MRI brain debris lateral ventricles primarily suspicious for infectious debris.  Suprasellar mass with abnormal findings.  Cardiology Data  . EKG sinus tachycardia, no acute changes.  Independently reviewed.  Other Data  .   Scheduled Meds: . amLODipine  10 mg Oral Daily  . diclofenac  75 mg Oral BID  . gabapentin  300 mg Oral TID  . insulin aspart  0-15 Units Subcutaneous TID WC  . iron polysaccharides  150 mg Oral BID WC   Continuous Infusions: . sodium chloride Stopped (10/30/18 0507)  . acyclovir Stopped (10/30/18 2979)  . cefTRIAXone (ROCEPHIN)  IV Stopped (10/29/18 2224)  . vancomycin Stopped (10/29/18 2336)    Principal Problem:   Acute bacterial meningitis Active Problems:   HTN (hypertension)   Diabetes mellitus type II, non insulin dependent (HCC)   Headache   Acute encephalopathy   Rhabdomyolysis   History of pituitary adenoma   Atherosclerosis   CKD (chronic kidney disease), stage III ([place])   AKI (acute kidney injury) ([place])   Pituitary adenoma ([place])   Sepsis ([place])   LOS: 1 day

## 2018-10-30 NOTE — Progress Notes (Signed)
INFECTIOUS DISEASE PROGRESS NOTE  ID: [name] is a 63 y.o. female with  Principal Problem:   Acute bacterial meningitis Active Problems:   HTN (hypertension)   Diabetes mellitus type II, non insulin dependent (HCC)   Headache   Acute encephalopathy   Rhabdomyolysis   History of pituitary adenoma   Atherosclerosis   CKD (chronic kidney disease), stage III (HCC)   AKI (acute kidney injury) ([place])   Pituitary adenoma (HCC)   Sepsis ([place])  Subjective: No complaints.  No photophobia.   Abtx:  Anti-infectives (From admission, onward)   Start     Dose/Rate Route Frequency Ordered Stop   10/31/18 0000  vancomycin (VANCOCIN) IVPB 750 mg/150 ml premix  Status:  Discontinued     750 mg 150 mL/hr over 60 Minutes Intravenous Every 24 hours 10/30/18 1216 10/30/18 1256   10/30/18 1800  acyclovir (ZOVIRAX) 800 mg in dextrose 5 % 150 mL IVPB     800 mg 166 mL/hr over 60 Minutes Intravenous Every 12 hours 10/30/18 1216     10/30/18 1257  vancomycin variable dose per unstable renal function (pharmacist dosing)      Does not apply See admin instructions 10/30/18 1257     10/29/18 1800  vancomycin (VANCOCIN) 1,250 mg in sodium chloride 0.9 % 250 mL IVPB  Status:  Discontinued     1,250 mg 166.7 mL/hr over 90 Minutes Intravenous Every 24 hours 10/29/18 0203 10/30/18 1216   10/29/18 1000  ceFEPIme (MAXIPIME) 1 g in sodium chloride 0.9 % 100 mL IVPB  Status:  Discontinued     1 g 200 mL/hr over 30 Minutes Intravenous Every 12 hours 10/29/18 0203 10/29/18 0858   10/29/18 0945  cefTRIAXone (ROCEPHIN) 2 g in sodium chloride 0.9 % 100 mL IVPB     2 g 200 mL/hr over 30 Minutes Intravenous 2 times daily 10/29/18 0858     10/29/18 0945  acyclovir (ZOVIRAX) 800 mg in dextrose 5 % 150 mL IVPB  Status:  Discontinued     800 mg 166 mL/hr over 60 Minutes Intravenous Every 8 hours 10/29/18 0943 10/30/18 1216   10/28/18 2115  vancomycin (VANCOCIN) 2,000 mg in sodium chloride 0.9 % 500 mL IVPB       2,000 mg 250 mL/hr over 120 Minutes Intravenous  Once 10/28/18 2046 10/29/18 0036   10/28/18 2045  ceFEPIme (MAXIPIME) 2 g in sodium chloride 0.9 % 100 mL IVPB     2 g 200 mL/hr over 30 Minutes Intravenous  Once 10/28/18 2040 10/28/18 2158   10/28/18 2045  metroNIDAZOLE (FLAGYL) IVPB 500 mg  Status:  Discontinued     500 mg 100 mL/hr over 60 Minutes Intravenous Every 8 hours 10/28/18 2040 10/29/18 0858   10/28/18 2045  vancomycin (VANCOCIN) IVPB 1000 mg/200 mL premix  Status:  Discontinued     1,000 mg 200 mL/hr over 60 Minutes Intravenous  Once 10/28/18 2040 10/28/18 2046      Medications:  Scheduled: . amLODipine  10 mg Oral Daily  . diclofenac  75 mg Oral BID  . gabapentin  300 mg Oral TID  . insulin aspart  0-15 Units Subcutaneous TID WC  . iron polysaccharides  150 mg Oral BID WC  . vancomycin variable dose per unstable renal function (pharmacist dosing)   Does not apply See admin instructions    Objective: Vital signs in last 24 hours: Temp:  [97.7 F (36.5 C)-98.8 F (37.1 C)] 98.2 F (36.8 C) (02/22 1125) Pulse  Rate:  [85-107] 97 (02/22 1300) Resp:  [12-24] 15 (02/22 1300) BP: (100-155)/(51-94) 140/76 (02/22 1300) SpO2:  [93 %-99 %] 95 % (02/22 1300)   General appearance: alert, cooperative and no distress Eyes: negative findings: no photophobia Throat: normal findings: oropharynx pink & moist without lesions or evidence of thrush Neck: FROM Resp: clear to auscultation bilaterally Cardio: regular rate and rhythm GI: normal findings: bowel sounds normal and soft, non-tender Extremities: edema none  Lab Results Recent Labs    10/29/18 1000 10/30/18 0303  WBC 11.9* 9.1  HGB 10.7* 9.2*  HCT 34.3* 32.6*  NA 134* 137  K 3.2* 3.2*  CL 101 105  CO2 23 22  BUN 13 19  CREATININE 1.21* 2.04*   Liver Panel Recent Labs    10/28/18 2037  PROT 8.2*  ALBUMIN 4.1  AST 41  ALT 25  ALKPHOS 88  BILITOT 0.7   Sedimentation Rate No results for input(s):  ESRSEDRATE in the last 72 hours. C-Reactive Protein No results for input(s): CRP in the last 72 hours.  Microbiology: Recent Results (from the past 240 hour(s))  Blood Culture (routine x 2)     Status: Abnormal (Preliminary result)   Collection Time: 10/28/18  8:37 PM  Result Value Ref Range Status   Specimen Description   Final    BLOOD LEFT ANTECUBITAL Performed at [facility] [address], [place], [place] [address]    Special Requests   Final    BOTTLES DRAWN AEROBIC AND ANAEROBIC Blood Culture adequate volume Performed at [facility] [address], [place], [place] [address]    Culture  Setup Time   Final    AEROBIC BOTTLE ONLY GRAM POSITIVE COCCI Organism ID to follow CRITICAL RESULT CALLED TO, READ BACK BY AND VERIFIED WITH: Shelda Jakes [facility] 10/29/18 2012 JDW Performed at [facility], [place] [address], [place], [place] [address]    Culture (A)  Final    STAPHYLOCOCCUS SPECIES (COAGULASE NEGATIVE) THE SIGNIFICANCE OF ISOLATING THIS ORGANISM FROM A SINGLE SET OF BLOOD CULTURES WHEN MULTIPLE SETS ARE DRAWN IS UNCERTAIN. PLEASE NOTIFY THE MICROBIOLOGY DEPARTMENT WITHIN ONE WEEK IF SPECIATION AND  SENSITIVITIES ARE REQUIRED.    Report Status PENDING  Incomplete  Blood Culture ID Panel (Reflexed)     Status: Abnormal   Collection Time: 10/28/18  8:37 PM  Result Value Ref Range Status   Enterococcus species NOT DETECTED NOT DETECTED Final   Listeria monocytogenes NOT DETECTED NOT DETECTED Final   Staphylococcus species DETECTED (A) NOT DETECTED Final    Comment: Methicillin (oxacillin) susceptible coagulase negative staphylococcus. Possible blood culture contaminant (unless isolated from more than one blood culture draw or clinical case suggests pathogenicity). No antibiotic treatment is indicated for blood  culture contaminants. CRITICAL RESULT CALLED TO, READ BACK BY AND VERIFIED WITH: Shelda Jakes [facility] 10/29/18 2012 JDW     Staphylococcus aureus (BCID) NOT DETECTED NOT DETECTED Final   Methicillin resistance NOT DETECTED NOT DETECTED Final   Streptococcus species NOT DETECTED NOT DETECTED Final   Streptococcus agalactiae NOT DETECTED NOT DETECTED Final   Streptococcus pneumoniae NOT DETECTED NOT DETECTED Final   Streptococcus pyogenes NOT DETECTED NOT DETECTED Final   Acinetobacter baumannii NOT DETECTED NOT DETECTED Final   Enterobacteriaceae species NOT DETECTED NOT DETECTED Final   Enterobacter cloacae complex NOT DETECTED NOT DETECTED Final   Escherichia coli NOT DETECTED NOT DETECTED Final   Klebsiella oxytoca NOT DETECTED NOT DETECTED Final   Klebsiella pneumoniae  NOT DETECTED NOT DETECTED Final   Proteus species NOT DETECTED NOT DETECTED Final   Serratia marcescens NOT DETECTED NOT DETECTED Final   Haemophilus influenzae NOT DETECTED NOT DETECTED Final   Neisseria meningitidis NOT DETECTED NOT DETECTED Final   Pseudomonas aeruginosa NOT DETECTED NOT DETECTED Final   Candida albicans NOT DETECTED NOT DETECTED Final   Candida glabrata NOT DETECTED NOT DETECTED Final   Candida krusei NOT DETECTED NOT DETECTED Final   Candida parapsilosis NOT DETECTED NOT DETECTED Final   Candida tropicalis NOT DETECTED NOT DETECTED Final    Comment: Performed at [facility], [place] [address] [place], [place] [address]  Blood Culture (routine x 2)     Status: None (Preliminary result)   Collection Time: 10/28/18  9:13 PM  Result Value Ref Range Status   Specimen Description BLOOD RIGHT WRIST  Final   Special Requests   Final    BOTTLES DRAWN AEROBIC AND ANAEROBIC Blood Culture adequate volume Performed at [facility] [address] [place], [place] [address]    Culture NO GROWTH < 24 HOURS  Final   Report Status PENDING  Incomplete  CSF culture     Status: None (Preliminary result)   Collection Time: 10/29/18  4:30 PM  Result Value Ref Range Status   Specimen Description CSF  Final    Special Requests CSF  Final   Gram Stain   Final    WBC PRESENT, PREDOMINANTLY PMN NO ORGANISMS SEEN CYTOPIN SMEAR Gram Stain Report Called to,Read Back By and Verified With: Gustavo Lah [id number] @ 1478 BY J SCOTTON Performed at [facility], [place] [address], [place], [place] [address]    Culture NO GROWTH < 12 HOURS  Final   Report Status PENDING  Incomplete  MRSA PCR Screening     Status: Abnormal   Collection Time: 10/29/18  5:00 PM  Result Value Ref Range Status   MRSA by PCR POSITIVE (A) NEGATIVE Final    Comment:        The GeneXpert MRSA Assay (FDA approved for NASAL specimens only), is one component of a comprehensive MRSA colonization surveillance program. It is not intended to diagnose MRSA infection nor to guide or monitor treatment for MRSA infections. RESULT CALLED TO, READ BACK BY AND VERIFIED WITH: Leda Gauze RN @2158  ON 10/29/2018 JACKSON,K Performed at [facility], [place] [address], [place], [place] [address]     Studies/Results: Dg Chest 2 View  Result Date: 10/28/2018 CLINICAL DATA:  Fever and congestion EXAM: CHEST - 2 VIEW COMPARISON:  02/08/2014 FINDINGS: The heart size and mediastinal contours are within normal limits. Both lungs are clear. Aortic atherosclerosis. Degenerative changes of the spine. IMPRESSION: No active cardiopulmonary disease. Electronically Signed   By: Donavan Foil M.D.   On: 10/28/2018 21:40   Ct Head Wo Contrast  Result Date: 10/29/2018 CLINICAL DATA:  Ataxia EXAM: CT HEAD WITHOUT CONTRAST TECHNIQUE: Contiguous axial images were obtained from the base of the skull through the vertex without intravenous contrast. COMPARISON:  None. FINDINGS: The examination is degraded by motion. Brain: There is no mass, hemorrhage or extra-axial collection. There is generalized atrophy without lobar predilection. Hypodensity of the white matter is most commonly associated with chronic microvascular disease. Sella is  expanded but otherwise poorly visualized due to motion and streak artifact. Vascular: Atherosclerotic calcification of the internal carotid arteries at the skull base. No abnormal hyperdensity of the major intracranial arteries or dural venous sinuses. Skull: The  visualized skull base, calvarium and extracranial soft tissues are normal. Sinuses/Orbits: Prior transsphenoidal surgery. No mastoid or middle ear effusion. The orbits are normal. IMPRESSION: 1. Motion degraded examination. 2. No acute intracranial abnormality. 3. Expanded sella and findings of prior transphenoidal surgery. Electronically Signed   By: Ulyses Jarred M.D.   On: 10/29/2018 02:08   Mr Brain Wo Contrast  Result Date: 10/29/2018 CLINICAL DATA:  Ataxia with stroke suspected EXAM: MRI HEAD WITHOUT CONTRAST TECHNIQUE: Multiplanar, multiecho pulse sequences of the brain and surrounding structures were obtained without intravenous contrast. COMPARISON:  Head CT from earlier today FINDINGS: Brain: There is diffusion hyperintense material layering within the occipital horns of the lateral ventricles. Mild ventriculomegaly without visualized obstructive process. No sulcal debris is seen. No susceptibility artifact to suggest blood products. No infarct, intra-axial masslike finding, or collection. Sellar mass that is partially T1 hyperintense, up to 2.3 cm. The sella is expanded and there is mass effect on the chiasm. By prior CT there may have been prior transsphenoidal hypophysectomy. Vascular: Major flow voids are preserved Skull and upper cervical spine: Negative for marrow lesion Sinuses/Orbits: Nasal septum defect which may be postsurgical given the above. A call has been placed to the ordering provider. The patient is already being treated empirically for meningitis. IMPRESSION: 1. Debris layering in the occipital horns of the lateral ventricles, primarily suspicious for infectious debris. This is not acute blood products by prior CT. 2. Mild  lateral ventriculomegaly, recommend follow-up. 3. Sellar and suprasellar mass impacting the chiasm, compatible with pituitary adenoma. Please correlate with surgical history as there has likely been trans-sphenoidal hypophysectomy. The mass shows areas of T1 shortening that could be proteinaceous cavities but need correlation for symptoms of pituitary hemorrhage. Electronically Signed   By: Monte Fantasia M.D.   On: 10/29/2018 08:27   Dg Fluoro Guide Lumbar Puncture  Result Date: 10/29/2018 Etheleen Mayhew, MD     10/29/2018  5:06 PM Fluoro guided LP performed at L3-L4.  6 inch 20 gauge needle utilized.  Opening pressure 23 cm H2O.  12 mL cloudy yellow CSF obtained and sent to lab for analysis.  Please see dictation in PACS for full details.      Assessment/Plan: Meningitis Hyperglycemia- DM2 Rhabdo  Total days of antibiotics: 1 vanco/ceftriaxone/Acyclovir  CSF with neutrophil predominance Amp added by neuro, appreciate their f/u.  Await her CSF Cx.  HIV (-) HSV seems less likely          Bobby Rumpf MD, FACP Infectious Diseases (pager) [phone] [email or web address] 10/30/2018, 2:52 PM  LOS: 1 day

## 2018-10-31 ENCOUNTER — Encounter (HOSPITAL_COMMUNITY): Payer: Self-pay

## 2018-10-31 DIAGNOSIS — E119 Type 2 diabetes mellitus without complications: Secondary | ICD-10-CM

## 2018-10-31 LAB — BASIC METABOLIC PANEL
Anion gap: 8 (ref 5–15)
BUN: 22 mg/dL — ABNORMAL HIGH (ref 6–20)
CO2: 20 mmol/L — ABNORMAL LOW (ref 22–32)
CREATININE: 1.66 mg/dL — AB (ref 0.44–1.00)
Calcium: 7.9 mg/dL — ABNORMAL LOW (ref 8.9–10.3)
Chloride: 108 mmol/L (ref 98–111)
GFR calc Af Amer: 38 mL/min — ABNORMAL LOW (ref >60)
GFR calc non Af Amer: 33 mL/min — ABNORMAL LOW (ref >60)
Glucose, Bld: 199 mg/dL — ABNORMAL HIGH (ref 70–99)
Potassium: 3.2 mmol/L — ABNORMAL LOW (ref 3.5–5.1)
Sodium: 136 mmol/L (ref 135–145)

## 2018-10-31 LAB — GLUCOSE, CAPILLARY
Glucose-Capillary: 151 mg/dL — ABNORMAL HIGH (ref 70–99)
Glucose-Capillary: 145 mg/dL — ABNORMAL HIGH (ref 70–99)
Glucose-Capillary: 159 mg/dL — ABNORMAL HIGH (ref 70–99)
Glucose-Capillary: 168 mg/dL — ABNORMAL HIGH (ref 70–99)

## 2018-10-31 LAB — CBC
HCT: 29 % — ABNORMAL LOW (ref 36.0–46.0)
HEMOGLOBIN: 8.6 g/dL — AB (ref 12.0–15.0)
MCH: 27.3 pg (ref 26.0–34.0)
MCHC: 29.7 g/dL — AB (ref 30.0–36.0)
MCV: 92.1 fL (ref 80.0–100.0)
Platelets: 180 10*3/uL (ref 150–400)
RBC: 3.15 MIL/uL — ABNORMAL LOW (ref 3.87–5.11)
RDW: 15.1 % (ref 11.5–15.5)
WBC: 6.3 10*3/uL (ref 4.0–10.5)
nRBC: 0 % (ref 0.0–0.2)

## 2018-10-31 LAB — CULTURE, BLOOD (ROUTINE X 2): Special Requests: ADEQUATE

## 2018-10-31 LAB — CK: Total CK: 4615 U/L — ABNORMAL HIGH (ref 38–234)

## 2018-10-31 LAB — VANCOMYCIN, RANDOM: Vancomycin Rm: 11

## 2018-10-31 MED ORDER — VANCOMYCIN HCL 10 G IV SOLR
1250.0000 mg | INTRAVENOUS | Status: DC
  Administered 2018-10-31: 1250 mg via INTRAVENOUS
  Filled 2018-10-31 (×2): qty 1250

## 2018-10-31 MED ORDER — MUPIROCIN 2 % EX OINT
1.0000 "application " | TOPICAL_OINTMENT | Freq: Two times a day (BID) | CUTANEOUS | Status: DC
  Administered 2018-10-31 – 2018-11-03 (×5): 1 via NASAL
  Filled 2018-10-31: qty 22

## 2018-10-31 MED ORDER — POTASSIUM CHLORIDE CRYS ER 20 MEQ PO TBCR
40.0000 meq | EXTENDED_RELEASE_TABLET | ORAL | Status: AC
  Administered 2018-10-31 (×2): 40 meq via ORAL
  Filled 2018-10-31 (×2): qty 2

## 2018-10-31 MED ORDER — MUPIROCIN 2 % EX OINT: TOPICAL_OINTMENT | Freq: Two times a day (BID) | CUTANEOUS | Status: DC

## 2018-10-31 NOTE — Progress Notes (Signed)
INFECTIOUS DISEASE PROGRESS NOTE  ID: [name] is a 63 y.o. female with  Principal Problem:   Acute bacterial meningitis Active Problems:   HTN (hypertension)   Diabetes mellitus type II, non insulin dependent (HCC)   Headache   Acute encephalopathy   Rhabdomyolysis   History of pituitary adenoma   Atherosclerosis   CKD (chronic kidney disease), stage III (HCC)   AKI (acute kidney injury) ([place])   Pituitary adenoma (HCC)   Sepsis ([place])  Subjective: Temp to 102.8 at midnight, afeb since.  Feels well.   Abtx:  Anti-infectives (From admission, onward)   Start     Dose/Rate Route Frequency Ordered Stop   10/31/18 1200  vancomycin (VANCOCIN) 1,250 mg in sodium chloride 0.9 % 250 mL IVPB     1,250 mg 166.7 mL/hr over 90 Minutes Intravenous Every 24 hours 10/31/18 0940     10/31/18 0000  vancomycin (VANCOCIN) IVPB 750 mg/150 ml premix  Status:  Discontinued     750 mg 150 mL/hr over 60 Minutes Intravenous Every 24 hours 10/30/18 1216 10/30/18 1256   10/30/18 1800  acyclovir (ZOVIRAX) 800 mg in dextrose 5 % 150 mL IVPB  Status:  Discontinued     800 mg 166 mL/hr over 60 Minutes Intravenous Every 12 hours 10/30/18 1216 10/31/18 0743   10/30/18 1600  ampicillin (OMNIPEN) 2 g in sodium chloride 0.9 % 100 mL IVPB     2 g 300 mL/hr over 20 Minutes Intravenous Every 6 hours 10/30/18 1525     10/30/18 1257  vancomycin variable dose per unstable renal function (pharmacist dosing)  Status:  Discontinued      Does not apply See admin instructions 10/30/18 1257 10/31/18 0940   10/29/18 1800  vancomycin (VANCOCIN) 1,250 mg in sodium chloride 0.9 % 250 mL IVPB  Status:  Discontinued     1,250 mg 166.7 mL/hr over 90 Minutes Intravenous Every 24 hours 10/29/18 0203 10/30/18 1216   10/29/18 1000  ceFEPIme (MAXIPIME) 1 g in sodium chloride 0.9 % 100 mL IVPB  Status:  Discontinued     1 g 200 mL/hr over 30 Minutes Intravenous Every 12 hours 10/29/18 0203 10/29/18 0858   10/29/18 0945   cefTRIAXone (ROCEPHIN) 2 g in sodium chloride 0.9 % 100 mL IVPB     2 g 200 mL/hr over 30 Minutes Intravenous 2 times daily 10/29/18 0858     10/29/18 0945  acyclovir (ZOVIRAX) 800 mg in dextrose 5 % 150 mL IVPB  Status:  Discontinued     800 mg 166 mL/hr over 60 Minutes Intravenous Every 8 hours 10/29/18 0943 10/30/18 1216   10/28/18 2115  vancomycin (VANCOCIN) 2,000 mg in sodium chloride 0.9 % 500 mL IVPB     2,000 mg 250 mL/hr over 120 Minutes Intravenous  Once 10/28/18 2046 10/29/18 0036   10/28/18 2045  ceFEPIme (MAXIPIME) 2 g in sodium chloride 0.9 % 100 mL IVPB     2 g 200 mL/hr over 30 Minutes Intravenous  Once 10/28/18 2040 10/28/18 2158   10/28/18 2045  metroNIDAZOLE (FLAGYL) IVPB 500 mg  Status:  Discontinued     500 mg 100 mL/hr over 60 Minutes Intravenous Every 8 hours 10/28/18 2040 10/29/18 0858   10/28/18 2045  vancomycin (VANCOCIN) IVPB 1000 mg/200 mL premix  Status:  Discontinued     1,000 mg 200 mL/hr over 60 Minutes Intravenous  Once 10/28/18 2040 10/28/18 2046      Medications:  Scheduled: . amLODipine  10  mg Oral Daily  . diclofenac  75 mg Oral BID  . gabapentin  300 mg Oral TID  . insulin aspart  0-15 Units Subcutaneous TID WC  . insulin aspart  0-5 Units Subcutaneous QHS  . iron polysaccharides  150 mg Oral BID WC    Objective: Vital signs in last 24 hours: Temp:  [97.7 F (36.5 C)-102.8 F (39.3 C)] 97.7 F (36.5 C) (02/23 0800) Pulse Rate:  [94-133] 108 (02/23 1007) Resp:  [14-26] 26 (02/23 1007) BP: (95-190)/(58-104) 145/86 (02/23 1007) SpO2:  [94 %-100 %] 98 % (02/23 1007)   General appearance: alert, cooperative and no distress Eyes: no photophobia Neck: FROM Resp: clear to auscultation bilaterally Cardio: regular rate and rhythm GI: normal findings: bowel sounds normal and soft, non-tender  Lab Results Recent Labs    10/30/18 0303 10/31/18 0342  WBC 9.1 6.3  HGB 9.2* 8.6*  HCT 32.6* 29.0*  NA 137 136  K 3.2* 3.2*  CL 105 108    CO2 22 20*  BUN 19 22*  CREATININE 2.04* 1.66*   Liver Panel Recent Labs    10/28/18 2037  PROT 8.2*  ALBUMIN 4.1  AST 41  ALT 25  ALKPHOS 88  BILITOT 0.7   Sedimentation Rate No results for input(s): ESRSEDRATE in the last 72 hours. C-Reactive Protein No results for input(s): CRP in the last 72 hours.  Microbiology: Recent Results (from the past 240 hour(s))  Blood Culture (routine x 2)     Status: Abnormal   Collection Time: 10/28/18  8:37 PM  Result Value Ref Range Status   Specimen Description   Final    BLOOD LEFT ANTECUBITAL Performed at [facility] [address], [place], [place] [address]    Special Requests   Final    BOTTLES DRAWN AEROBIC AND ANAEROBIC Blood Culture adequate volume Performed at [facility] [address], [place], [place] [address]    Culture  Setup Time   Final    AEROBIC BOTTLE ONLY GRAM POSITIVE COCCI Organism ID to follow CRITICAL RESULT CALLED TO, READ BACK BY AND VERIFIED WITH: Shelda Jakes [facility] 10/29/18 2012 JDW Performed at [facility], [place] [address] [place], [place] [address]    Culture (A)  Final    STAPHYLOCOCCUS SPECIES (COAGULASE NEGATIVE) THE SIGNIFICANCE OF ISOLATING THIS ORGANISM FROM A SINGLE SET OF BLOOD CULTURES WHEN MULTIPLE SETS ARE DRAWN IS UNCERTAIN. PLEASE NOTIFY THE MICROBIOLOGY DEPARTMENT WITHIN ONE WEEK IF SPECIATION AND SENSITIVITIES ARE REQUIRED.    Report Status 10/31/2018 FINAL  Final  Blood Culture ID Panel (Reflexed)     Status: Abnormal   Collection Time: 10/28/18  8:37 PM  Result Value Ref Range Status   Enterococcus species NOT DETECTED NOT DETECTED Final   Listeria monocytogenes NOT DETECTED NOT DETECTED Final   Staphylococcus species DETECTED (A) NOT DETECTED Final    Comment: Methicillin (oxacillin) susceptible coagulase negative staphylococcus. Possible blood culture contaminant (unless isolated from more than one blood culture draw or clinical case  suggests pathogenicity). No antibiotic treatment is indicated for blood  culture contaminants. CRITICAL RESULT CALLED TO, READ BACK BY AND VERIFIED WITH: Shelda Jakes [facility] 10/29/18 2012 JDW    Staphylococcus aureus (BCID) NOT DETECTED NOT DETECTED Final   Methicillin resistance NOT DETECTED NOT DETECTED Final   Streptococcus species NOT DETECTED NOT DETECTED Final   Streptococcus agalactiae NOT DETECTED NOT DETECTED Final   Streptococcus pneumoniae NOT DETECTED NOT DETECTED Final   Streptococcus pyogenes  NOT DETECTED NOT DETECTED Final   Acinetobacter baumannii NOT DETECTED NOT DETECTED Final   Enterobacteriaceae species NOT DETECTED NOT DETECTED Final   Enterobacter cloacae complex NOT DETECTED NOT DETECTED Final   Escherichia coli NOT DETECTED NOT DETECTED Final   Klebsiella oxytoca NOT DETECTED NOT DETECTED Final   Klebsiella pneumoniae NOT DETECTED NOT DETECTED Final   Proteus species NOT DETECTED NOT DETECTED Final   Serratia marcescens NOT DETECTED NOT DETECTED Final   Haemophilus influenzae NOT DETECTED NOT DETECTED Final   Neisseria meningitidis NOT DETECTED NOT DETECTED Final   Pseudomonas aeruginosa NOT DETECTED NOT DETECTED Final   Candida albicans NOT DETECTED NOT DETECTED Final   Candida glabrata NOT DETECTED NOT DETECTED Final   Candida krusei NOT DETECTED NOT DETECTED Final   Candida parapsilosis NOT DETECTED NOT DETECTED Final   Candida tropicalis NOT DETECTED NOT DETECTED Final    Comment: Performed at [facility], [place] [address] [place], [place] [address]  Blood Culture (routine x 2)     Status: None (Preliminary result)   Collection Time: 10/28/18  9:13 PM  Result Value Ref Range Status   Specimen Description BLOOD RIGHT WRIST  Final   Special Requests   Final    BOTTLES DRAWN AEROBIC AND ANAEROBIC Blood Culture adequate volume Performed at [facility] [address] [place], [place] [address]    Culture NO GROWTH 3 DAYS  Final    Report Status PENDING  Incomplete  CSF culture     Status: None (Preliminary result)   Collection Time: 10/29/18  4:30 PM  Result Value Ref Range Status   Specimen Description CSF  Final   Special Requests CSF  Final   Gram Stain   Final    WBC PRESENT, PREDOMINANTLY PMN NO ORGANISMS SEEN CYTOPIN SMEAR Gram Stain Report Called to,Read Back By and Verified With: Gustavo Lah [id number] @ 8338 BY J SCOTTON Performed at [facility], [place] [address] [place], [place] [address]    Culture NO GROWTH 2 DAYS  Final   Report Status PENDING  Incomplete  MRSA PCR Screening     Status: Abnormal   Collection Time: 10/29/18  5:00 PM  Result Value Ref Range Status   MRSA by PCR POSITIVE (A) NEGATIVE Final    Comment:        The GeneXpert MRSA Assay (FDA approved for NASAL specimens only), is one component of a comprehensive MRSA colonization surveillance program. It is not intended to diagnose MRSA infection nor to guide or monitor treatment for MRSA infections. RESULT CALLED TO, READ BACK BY AND VERIFIED WITH: Leda Gauze RN @2158  ON 10/29/2018 JACKSON,K Performed at [facility] [place] [address], [place], [place] [address]     Studies/Results: [place] Pink Guide Lumbar Puncture  Result Date: 10/29/2018 CLINICAL DATA:  63 year old female with abnormal mental status. Clinical suspicion for acute bacterial meningitis. EXAM: DIAGNOSTIC LUMBAR PUNCTURE UNDER FLUOROSCOPIC GUIDANCE FLUOROSCOPY TIME:  Fluoroscopy Time:  1.3 minutes Radiation Exposure Index (if provided by the fluoroscopic device): 25.3 mGy PROCEDURE: Informed consent was obtained from the patient prior to the procedure, including potential complications of headache, allergy, and pain. With the patient prone, the lower back was prepped with Betadine. 1% Lidocaine was used for local anesthesia. Lumbar puncture was performed at the L3-L4 level using a 20 gauge needle with return of cloudy yellow CSF with an  opening pressure of 23 cm water. 12 ml of CSF were obtained for laboratory studies. The  patient tolerated the procedure well and there were no apparent complications. IMPRESSION: 1. Successful uncomplicated fluoroscopic guided lumbar puncture, as above. Electronically Signed   By: Vinnie Langton M.D.   On: 10/29/2018 17:14     Assessment/Plan: Meningitis Dm2 Rhabdo BCx 1/2 Coag Neg Staph  Total days of antibiotics: 2 vanco/ceftriaxone/Acyclovir/Amp  Agree with stop acyclovir CK improving Glc improving CSF Cx is ngtd.  She has been on anbx for 48h, can stop isolation.  Will continue to watch, consider repeat imaging if fever persists         Bobby Rumpf MD, FACP Infectious Diseases (pager) [phone] [email or web address] 10/31/2018, 12:15 PM  LOS: 2 days

## 2018-10-31 NOTE — Progress Notes (Signed)
Patient was transferred from ICU to [facility] at 1340. Alert and oriented x 4. No pain complained. Family member at bedside. Room is set up. Call light is within patient's reach. Floor matt applied.

## 2018-10-31 NOTE — Progress Notes (Signed)
Pharmacy Antibiotic Note  [name] is a 63 y.o. female with fever and hyperglycemia admitted on 10/28/2018 with sepsis.  Pharmacy has been consulted for cefepime and vancomycin dosing.  Update 2/21 AM: Meningitis had been low on DDx based on clinical presentation, however MRI now shows likely infection in lateral ventricals. ID, Neuro consulted; will perform LP with HSV PCR (vanc/cefepime already given); will change to Vanc/Acyclovir per Rx, plus Rocephin.   Today, 10/31/18  Day 3 full abxs - now on vanc/rocephin/amp  Vanc Random this AM 11 with last dose of vanc 1250mg  given on 2/21 around 2200 (~30hrs)  Scr improved this AM to 1.66 from 2.04 - CrCl 46  Afebrile  WBC improving  Only positive culture: blood with 1 of 4 bottles with Coag Neg Staph  Plan:  Based on when last vanc dose given as above and improvement in SCr, will restart vanc 1250mg  IV q24 as previously ordered  Continue Rocephin 2g IV q12 hr per MD, dosing appropriate  Continue ampicillin IV 2gm IV q6h (adjusted for CrCl<3ml/min)  Check vanc AUC as appropriate  F/u LP/culture results.  Follow up ID/neuro consults  Height: 5\' 6"  (167.6 cm) Weight: 247 lb (112 kg) IBW/kg (Calculated) : 59.3  Temp (24hrs), Avg:99.2 F (37.3 C), Min:97.7 F (36.5 C), Max:102.8 F (39.3 C)  Recent Labs  Lab 10/28/18 2037 10/29/18 0141 10/29/18 0609 10/29/18 1000 10/30/18 0303 10/31/18 0342  WBC 11.6*  --   --  11.9* 9.1 6.3  CREATININE 1.40*  --   --  1.21* 2.04* 1.66*  LATICACIDVEN 2.0* 2.0* 1.7  --   --   --   VANCORANDOM  --   --   --   --   --  11    Estimated Creatinine Clearance: 45.7 mL/min (A) (by C-G formula based on SCr of 1.66 mg/dL (H)).    No Known Allergies  Antimicrobials this admission: 2/20 cefepime >> 2/21 2/20 flagyl >> 2/21 2/20 vancomycin >> 2/21 Acyclovir >> 2/23 2/21 Rocephin >> 2/22 Ampicillin >>   Dose adjustments this admission: 2/21 Adjust vanc dose for  meningitis  Microbiology results: 2/20 BCx: 1 of 4 bottles with CNS 2/21 CSF: NGTD - HSV PCR: pending 2/21 MRSA PCR: positive 2/21 CSF cx: ngtd 2/20 BCx: 1/2 CoNS  Thank you for allowing pharmacy to be a part of this patient's care.  Adrian Saran, PharmD, BCPS 10/31/2018 9:39 AM

## 2018-10-31 NOTE — Progress Notes (Signed)
BP= 157/82. Patient has history of HTN. Medication was given as MD ordered.

## 2018-10-31 NOTE — Progress Notes (Signed)
PROGRESS NOTE  SISSI PADIA ITG:[id number] DOB: 08-20-1956 DOA: 10/28/2018 PCP: Chesley Noon, MD  Brief History   63 year old woman presented with fever and altered mental status, headache, found on floor at home sitting next to couch.  Reported fall at home.  Initially admitted for SIRS, rhabdomyolysis, acute encephalopathy.  Subsequent MRI brain revealed infectious disease in the lateral ventricles most likely meningitis.  A & P  Sepsis secondary to acute meningitis, suspect bacterial based on CSF studies, presented with acute encephalopathy, presented with word finding difficulties, fever, acute encephalopathy, headache. --Continues to improve although did have fever last night.  Speech is fluent, clear and appropriate, mentation appears normal, neurologic exam is nonfocal.   --Appreciate neurology and infectious disease.  Continue ceftriaxone, vancomycin, ampicillin pending further recommendations --HSV DNA PCR negative, stop acyclovir.  1/2 gram-positive cocci bacteremia.  Coag negative staph, contaminant.  Acute rhabdomyolysis, acute kidney injury superimposed on CKD stage III --CK trending down, renal function improving.  Continue aggressive IV fluids.   --BMP in a.m.  Diabetes mellitus type 2 --CBG remains stable, continue sliding scale insulin  Essential hypertension.  Currently hypertensive. --Remained stable.  Continue Norvasc.  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 --Follow-up with neurosurgery as an outpatient once acute illness has resolved.  PMH transsphenoidal pituitary resection 2006, breast cancer treated with chemotherapy, lumpectomy   Although had a fever last night, continues to improve clinically.  Continue empiric antibiotics, stop acyclovir, continue IV fluids, check BMP in a.m.  Transfer to medical floor.  DVT prophylaxis: SCDs Code Status: Full Family Communication: Daughter at bedside  Disposition Plan:  home   Murray Hodgkins, MD  [facility] Direct contact: see [email or web address]  7PM-7AM contact night coverage as above 10/31/2018, 11:18 AM  LOS: 2 days   Consultants  . Infectious disease . Neurology  Procedures  . LP pending  Antibiotics  . Cefepime . Ceftriaxone 2/21 > . Ampicillin 2/22 > . Vancomycin 2/21 > . Acyclovir 2/21 > 2/23  Interval History/Subjective  Continues to feel better.  Minimal headache.  No neck pain.  No nausea or vomiting.  Objective   Vitals:  Vitals:   10/31/18 0800 10/31/18 1007  BP: (!) 141/73 (!) 145/86  Pulse: 95 (!) 108  Resp: (!) 21 (!) 26  Temp: 97.7 F (36.5 C)   SpO2: 98% 98%    Exam:  Constitutional:   . Appears calm and comfortable, appears quite well Eyes:  . pupils and irises appear normal . Normal lids, ptosis not prominent today ENMT:  . grossly normal hearing  . Lips appear normal . Neck appears to have full range of movement Respiratory:  . CTA bilaterally, no w/r/r.  . Respiratory effort normal.  Cardiovascular:  . RRR, no m/r/g . No LE extremity edema   Abdomen:  . Soft Musculoskeletal:  . RUE, LUE, RLE, LLE   o strength and tone normal, no atrophy, no abnormal movements o No tenderness, masses Skin:  . No rashes, lesions, ulcers noted Psychiatric:  . Mental status o Mood, affect appropriate o Oriented to person, hospital, month, year . judgment and insight appear intact    I have personally reviewed the following:   Today's Data  . Urine output 1300 . Potassium 3.2 . Creatinine improved, 1.66 . CK trending down, 4615  Hemoglobin slightly lower 8.6.  WBC and platelets within normal limits.  Lab Data  . Reviewed  Micro Data  . Blood cultures 1/2 gram-positive cocci . Fourth bottle  CSF with 1000 RBC, 2322 WBC with 82% segs . CSF culture pending, no organisms seen, no growth 2 days  Imaging  . Chest x-ray independently reviewed no acute disease . CT head negative . MRI brain debris  lateral ventricles primarily suspicious for infectious debris.  Suprasellar mass with abnormal findings.  Cardiology Data  . EKG sinus tachycardia, no acute changes.  Independently reviewed.  Other Data  .   Scheduled Meds: . amLODipine  10 mg Oral Daily  . diclofenac  75 mg Oral BID  . gabapentin  300 mg Oral TID  . insulin aspart  0-15 Units Subcutaneous TID WC  . insulin aspart  0-5 Units Subcutaneous QHS  . iron polysaccharides  150 mg Oral BID WC  . potassium chloride  40 mEq Oral Q4H   Continuous Infusions: . sodium chloride Stopped (10/30/18 1753)  . ampicillin (OMNIPEN) IV Stopped (10/31/18 1034)  . cefTRIAXone (ROCEPHIN)  IV 2 g (10/31/18 1056)  . vancomycin      Principal Problem:   Acute bacterial meningitis Active Problems:   HTN (hypertension)   Diabetes mellitus type II, non insulin dependent (HCC)   Headache   Acute encephalopathy   Rhabdomyolysis   History of pituitary adenoma   Atherosclerosis   CKD (chronic kidney disease), stage III ([place])   AKI (acute kidney injury) ([place])   Pituitary adenoma ([place])   Sepsis ([place])   LOS: 2 days

## 2018-11-01 LAB — CBC
HCT: 29.8 % — ABNORMAL LOW (ref 36.0–46.0)
Hemoglobin: 8.8 g/dL — ABNORMAL LOW (ref 12.0–15.0)
MCH: 27.8 pg (ref 26.0–34.0)
MCHC: 29.5 g/dL — ABNORMAL LOW (ref 30.0–36.0)
MCV: 94.3 fL (ref 80.0–100.0)
Platelets: 221 10*3/uL (ref 150–400)
RBC: 3.16 MIL/uL — ABNORMAL LOW (ref 3.87–5.11)
RDW: 15.3 % (ref 11.5–15.5)
WBC: 5 10*3/uL (ref 4.0–10.5)
nRBC: 0 % (ref 0.0–0.2)

## 2018-11-01 LAB — GLUCOSE, CAPILLARY
Glucose-Capillary: 136 mg/dL — ABNORMAL HIGH (ref 70–99)
Glucose-Capillary: 196 mg/dL — ABNORMAL HIGH (ref 70–99)
Glucose-Capillary: 116 mg/dL — ABNORMAL HIGH (ref 70–99)
Glucose-Capillary: 189 mg/dL — ABNORMAL HIGH (ref 70–99)

## 2018-11-01 LAB — BASIC METABOLIC PANEL
ANION GAP: 8 (ref 5–15)
BUN: 14 mg/dL (ref 6–20)
CALCIUM: 8.5 mg/dL — AB (ref 8.9–10.3)
CO2: 22 mmol/L (ref 22–32)
Chloride: 113 mmol/L — ABNORMAL HIGH (ref 98–111)
Creatinine, Ser: 1.25 mg/dL — ABNORMAL HIGH (ref 0.44–1.00)
GFR calc Af Amer: 54 mL/min — ABNORMAL LOW (ref >60)
GFR, EST NON AFRICAN AMERICAN: 47 mL/min — AB (ref >60)
Glucose, Bld: 157 mg/dL — ABNORMAL HIGH (ref 70–99)
POTASSIUM: 3.7 mmol/L (ref 3.5–5.1)
Sodium: 143 mmol/L (ref 135–145)

## 2018-11-01 LAB — MAGNESIUM: Magnesium: 1.5 mg/dL — ABNORMAL LOW (ref 1.7–2.4)

## 2018-11-01 MED ORDER — TIZANIDINE HCL 4 MG PO TABS
4.0000 mg | ORAL_TABLET | Freq: Once | ORAL | Status: AC
  Administered 2018-11-01: 4 mg via ORAL
  Filled 2018-11-01: qty 1

## 2018-11-01 MED ORDER — MAGNESIUM SULFATE 2 GM/50ML IV SOLN
2.0000 g | Freq: Once | INTRAVENOUS | Status: AC
  Administered 2018-11-01: 2 g via INTRAVENOUS
  Filled 2018-11-01: qty 50

## 2018-11-01 MED ORDER — VANCOMYCIN HCL 10 G IV SOLR
1500.0000 mg | INTRAVENOUS | Status: DC
  Administered 2018-11-01 – 2018-11-03 (×3): 1500 mg via INTRAVENOUS
  Filled 2018-11-01 (×3): qty 1500

## 2018-11-01 MED ORDER — SODIUM CHLORIDE 0.9 % IV SOLN
2.0000 g | INTRAVENOUS | Status: DC
  Administered 2018-11-01 – 2018-11-03 (×11): 2 g via INTRAVENOUS
  Filled 2018-11-01: qty 2000
  Filled 2018-11-01 (×4): qty 2
  Filled 2018-11-01: qty 2000
  Filled 2018-11-01 (×2): qty 2
  Filled 2018-11-01: qty 2000
  Filled 2018-11-01 (×4): qty 2

## 2018-11-01 NOTE — Progress Notes (Signed)
Pharmacy Antibiotic Note  [name] is a 63 y.o. female with fever and hyperglycemia admitted on 10/28/2018 with sepsis.  Pharmacy has been consulted for cefepime and vancomycin dosing.  2/21 AM: Meningitis had been low on DDx based on clinical presentation, however MRI now shows likely infection in lateral ventricals. ID, Neuro consulted; will perform LP with HSV PCR (vanc/cefepime already given); will change to Vanc/Acyclovir per Rx, plus Rocephin.  Today, 11/01/2018  Day #4 antibiotics  Scr improvig  WBC improved to WNL  CSF cx = NGTD  BCx with MS-CoNS 1/2 sets - likely contaminant but being covered with current abx  Plan:   For improved renal function  Adjust ampicillin to 2gm IV q4h  Adjust vancomycin to 1500mg  IV q24h (est trough = 16.6, AUC = 644).    Goal trough = 15-20 for meningitis (do not dose per AUC)  Continue Rocephin 2g IV q12 hr per MD, dosing appropriate  Daily SCr   Height: 5\' 6"  (167.6 cm) Weight: 247 lb (112 kg) IBW/kg (Calculated) : 59.3  Temp (24hrs), Avg:98.2 F (36.8 C), Min:98.1 F (36.7 C), Max:98.5 F (36.9 C)  Recent Labs  Lab 10/28/18 2037 10/29/18 0141 10/29/18 0609 10/29/18 1000 10/30/18 0303 10/31/18 0342 11/01/18 0703  WBC 11.6*  --   --  11.9* 9.1 6.3 5.0  CREATININE 1.40*  --   --  1.21* 2.04* 1.66* 1.25*  LATICACIDVEN 2.0* 2.0* 1.7  --   --   --   --   VANCORANDOM  --   --   --   --   --  11  --     Estimated Creatinine Clearance: 60.7 mL/min (A) (by C-G formula based on SCr of 1.25 mg/dL (H)).    No Known Allergies  Antimicrobials this admission: 2/20 cefepime >> 2/21 2/20 flagyl >> 2/21 2/20 vancomycin >> 2/21 Acyclovir >> 2/23 2/21 Rocephin >> 2/22 Ampicillin >>   Dose adjustments this admission: 2/21 Adjust vanc dose for meningitis 2/22: rise in SCr - d/c vanc and check VR in AM and change acyclovir from q8 to q12 based on CrCl N 25-50 2/23 0342 VR = 11 - last dose of vanc 1250mg  given 1/21 at 2206  (~30 hrs) - vanc 1250mg  IV q24 that pt was on previously still seems like correct dose based on random level and SCr improved - restart 1250mg  q24 2/24: SCr improving - change to 1500mg  q24h for Trough = 16/6 (AUC = 644). Amp dose adjusted for Improved renal    Microbiology results: 2/20 BCx: 1 of 4 bottles with CNS 2/21 HSV PCR: negative 2/21 MRSA PCR: positive 2/21 CSF cx: ngtd   Thank you for allowing pharmacy to be a part of this patient's care.  Doreene Eland, PharmD, BCPS.   Work Cell: [phone] 11/01/2018 10:05 AM

## 2018-11-01 NOTE — Progress Notes (Signed)
Patient ID: [name], female   DOB: 14-Aug-1956, 63 y.o.   MRN: [id number]         [facility] for Infectious Disease  Date of Admission:  10/28/2018   Total days of antibiotics 5        Day 4 vancomycin        Day 4 ceftriaxone        Day 3 ampicillin ASSESSMENT: She has acute bacterial meningitis and is improving rapidly on empiric antibiotic therapy.  This is most likely pneumococcal infection.  Listeria is quite unlikely and I will consider stopping ampicillin soon.  The most likely reason that her CSF cultures are negative his the antibiotic therapy she took before the lumbar puncture was performed.  Assuming she continues to improve, we can consider completing 14 days of total therapy as an outpatient.  PLAN: 1. Continue current antibiotics for now  Principal Problem:   Acute bacterial meningitis Active Problems:   Headache   Acute encephalopathy   Rhabdomyolysis   HTN (hypertension)   Diabetes mellitus type II, non insulin dependent (HCC)   History of pituitary adenoma   Atherosclerosis   CKD (chronic kidney disease), stage III (HCC)   AKI (acute kidney injury) ([place])   Pituitary adenoma (HCC)   Sepsis ([place])   Scheduled Meds: . amLODipine  10 mg Oral Daily  . diclofenac  75 mg Oral BID  . gabapentin  300 mg Oral TID  . insulin aspart  0-15 Units Subcutaneous TID WC  . insulin aspart  0-5 Units Subcutaneous QHS  . iron polysaccharides  150 mg Oral BID WC  . mupirocin ointment  1 application Nasal BID   Continuous Infusions: . ampicillin (OMNIPEN) IV 2 g (11/01/18 1335)  . cefTRIAXone (ROCEPHIN)  IV 2 g (11/01/18 0910)  . vancomycin     PRN Meds:.acetaminophen **OR** acetaminophen, hydrALAZINE, ondansetron **OR** ondansetron (ZOFRAN) IV   SUBJECTIVE: She is feeling dramatically better today.  She has had only minimal headache.  She recalls that her headache came on suddenly on 10/27/2018.  She thought she had a sinus infection.  Her PCP called in an  antibiotic.  She took 1 dose that night for coming to the hospital the following day.  Review of Systems: Review of Systems  Constitutional: Negative for chills, diaphoresis and fever.  HENT:       She has not had unusual rhinorrhea (i.e., CSF leak) since her pituitary surgery several years ago.  Neurological: Positive for headaches.    No Known Allergies  OBJECTIVE: Vitals:   10/31/18 1422 10/31/18 2105 11/01/18 0408 11/01/18 1415  BP: (!) 157/82 (!) 159/89 120/73 (!) 166/92  Pulse: 99 (!) 102 96 98  Resp: 16 18 16 18   Temp: 98.1 F (36.7 C) 98.1 F (36.7 C) 98.5 F (36.9 C) (!) 97.5 F (36.4 C)  TempSrc:  Oral Oral Oral  SpO2: 98% 97% 95% 100%  Weight:      Height:       Body mass index is 39.87 kg/m.  Physical Exam Constitutional:      Comments: She is alert, smiling and talkative.  Neck:     Musculoskeletal: Neck supple.  Cardiovascular:     Rate and Rhythm: Normal rate and regular rhythm.     Heart sounds: No murmur.  Pulmonary:     Effort: Pulmonary effort is normal.     Breath sounds: Normal breath sounds.  Neurological:     Comments: Her speech is back to normal.  Psychiatric:        Mood and Affect: Mood normal.     Lab Results Lab Results  Component Value Date   WBC 5.0 11/01/2018   HGB 8.8 (L) 11/01/2018   HCT 29.8 (L) 11/01/2018   MCV 94.3 11/01/2018   PLT 221 11/01/2018    Lab Results  Component Value Date   CREATININE 1.25 (H) 11/01/2018   BUN 14 11/01/2018   NA 143 11/01/2018   K 3.7 11/01/2018   CL 113 (H) 11/01/2018   CO2 22 11/01/2018    Lab Results  Component Value Date   ALT 25 10/28/2018   AST 41 10/28/2018   ALKPHOS 88 10/28/2018   BILITOT 0.7 10/28/2018     Microbiology: Recent Results (from the past 240 hour(s))  Blood Culture (routine x 2)     Status: Abnormal   Collection Time: 10/28/18  8:37 PM  Result Value Ref Range Status   Specimen Description   Final    BLOOD LEFT ANTECUBITAL Performed at [facility], [place] [address], [place], [place] [address]    Special Requests   Final    BOTTLES DRAWN AEROBIC AND ANAEROBIC Blood Culture adequate volume Performed at [place] [facility] [place] [address] [place], [place] [address]    Culture  Setup Time   Final    AEROBIC BOTTLE ONLY GRAM POSITIVE COCCI Organism ID to follow CRITICAL RESULT CALLED TO, READ BACK BY AND VERIFIED WITH: Shelda Jakes [facility] 10/29/18 2012 JDW Performed at [facility], [place] [address] [place], [place] [address]    Culture (A)  Final    STAPHYLOCOCCUS SPECIES (COAGULASE NEGATIVE) THE SIGNIFICANCE OF ISOLATING THIS ORGANISM FROM A SINGLE SET OF BLOOD CULTURES WHEN MULTIPLE SETS ARE DRAWN IS UNCERTAIN. PLEASE NOTIFY THE MICROBIOLOGY DEPARTMENT WITHIN ONE WEEK IF SPECIATION AND SENSITIVITIES ARE REQUIRED.    Report Status 10/31/2018 FINAL  Final  Blood Culture ID Panel (Reflexed)     Status: Abnormal   Collection Time: 10/28/18  8:37 PM  Result Value Ref Range Status   Enterococcus species NOT DETECTED NOT DETECTED Final   Listeria monocytogenes NOT DETECTED NOT DETECTED Final   Staphylococcus species DETECTED (A) NOT DETECTED Final    Comment: Methicillin (oxacillin) susceptible coagulase negative staphylococcus. Possible blood culture contaminant (unless isolated from more than one blood culture draw or clinical case suggests pathogenicity). No antibiotic treatment is indicated for blood  culture contaminants. CRITICAL RESULT CALLED TO, READ BACK BY AND VERIFIED WITH: Shelda Jakes [facility] 10/29/18 2012 JDW    Staphylococcus aureus (BCID) NOT DETECTED NOT DETECTED Final   Methicillin resistance NOT DETECTED NOT DETECTED Final   Streptococcus species NOT DETECTED NOT DETECTED Final   Streptococcus agalactiae NOT DETECTED NOT DETECTED Final   Streptococcus pneumoniae NOT DETECTED NOT DETECTED Final   Streptococcus pyogenes NOT DETECTED NOT DETECTED Final   Acinetobacter baumannii NOT DETECTED NOT  DETECTED Final   Enterobacteriaceae species NOT DETECTED NOT DETECTED Final   Enterobacter cloacae complex NOT DETECTED NOT DETECTED Final   Escherichia coli NOT DETECTED NOT DETECTED Final   Klebsiella oxytoca NOT DETECTED NOT DETECTED Final   Klebsiella pneumoniae NOT DETECTED NOT DETECTED Final   Proteus species NOT DETECTED NOT DETECTED Final   Serratia marcescens NOT DETECTED NOT DETECTED Final   Haemophilus influenzae NOT DETECTED NOT DETECTED Final   Neisseria meningitidis NOT DETECTED NOT DETECTED Final   Pseudomonas aeruginosa NOT DETECTED NOT DETECTED Final   Candida albicans NOT DETECTED NOT  DETECTED Final   Candida glabrata NOT DETECTED NOT DETECTED Final   Candida krusei NOT DETECTED NOT DETECTED Final   Candida parapsilosis NOT DETECTED NOT DETECTED Final   Candida tropicalis NOT DETECTED NOT DETECTED Final    Comment: Performed at [facility], [place] [address], [place], [place] [address]  Blood Culture (routine x 2)     Status: None (Preliminary result)   Collection Time: 10/28/18  9:13 PM  Result Value Ref Range Status   Specimen Description BLOOD RIGHT WRIST  Final   Special Requests   Final    BOTTLES DRAWN AEROBIC AND ANAEROBIC Blood Culture adequate volume Performed at [place] [address] [place], [place] [address]    Culture   Final    NO GROWTH 4 DAYS Performed at [facility], [place] [address] [place], [place] [address]    Report Status PENDING  Incomplete  CSF culture     Status: None (Preliminary result)   Collection Time: 10/29/18  4:30 PM  Result Value Ref Range Status   Specimen Description   Final    CSF Performed at [facility] [place] [address] [place], [place] [address]    Special Requests   Final    CSF Performed at [place] [address], [place], [place] [address]    Gram Stain   Final    WBC PRESENT, PREDOMINANTLY PMN NO ORGANISMS SEEN CYTOPIN SMEAR Gram  Stain Report Called to,Read Back By and Verified With: Gustavo Lah [id number] @ 7846 BY J SCOTTON Performed at [facility], [place] [address], [place], [place] [address]    Culture   Final    NO GROWTH 3 DAYS Performed at [facility], [place] [address] [place], [place] [address]    Report Status PENDING  Incomplete  MRSA PCR Screening     Status: Abnormal   Collection Time: 10/29/18  5:00 PM  Result Value Ref Range Status   MRSA by PCR POSITIVE (A) NEGATIVE Final    Comment:        The GeneXpert MRSA Assay (FDA approved for NASAL specimens only), is one component of a comprehensive MRSA colonization surveillance program. It is not intended to diagnose MRSA infection nor to guide or monitor treatment for MRSA infections. RESULT CALLED TO, READ BACK BY AND VERIFIED WITH: WILSON,N RN @2158  ON 10/29/2018 JACKSON,K Performed at [facility], [place] [address] [place],  [address]     Michel Bickers, [place] [facility] [phone] pager   [phone] cell 11/01/2018, 3:42 PM

## 2018-11-01 NOTE — Evaluation (Signed)
Physical Therapy Evaluation Patient Details Name: [name] MRN: [id number] DOB: 1956-04-04 Today's Date: 11/01/2018   History of Present Illness  Patient is a 63 y/o female presenting to the ED on 10/28/2018 with AMS. Flu neg, CXR neg, CT head neg, UA neg.  Past medical history significant of HTN, BRCA, DM2. Brain MRI showed previous evidence of transsphenoidal hypophysis ectomy for pituitary adenoma.  She was also noted to have debris in the lateral ventricles concerning for meningitis. Admitted for Acute bacterial meningitis.    Clinical Impression  Ms. Coor is a very pleasant 63 y/o female admitted with the above listed diagnosis. Patient reports IND with mobility and ADLs prior to admission and worked full time. Patient today requiring up to [place] for mobility due to unsteadiness. Patient very motivated to regain prior functional abilities. Will currently recommend HHPT vs OPPT depending on clinical progress. PT to follow acutely to maximize safe and independent mobility prior to return home.      Follow Up Recommendations Home health PT;Outpatient PT;Supervision for mobility/OOB    Equipment Recommendations  Rolling walker with 5" wheels;None recommended by PT(will continue to assess for need)    Recommendations for Other Services       Precautions / Restrictions Precautions Precautions: Fall Restrictions Weight Bearing Restrictions: No      Mobility  Bed Mobility Overal bed mobility: Needs Assistance Bed Mobility: Supine to Sit     Supine to sit: Supervision     General bed mobility comments: increased time and effot - use of bed rails  Transfers Overall transfer level: Needs assistance Equipment used: 1 person hand held assist Transfers: Sit to/from Stand;Stand Pivot Transfers Sit to Stand: Min assist Stand pivot transfers: Min assist       General transfer comment: Min A to power up and light Min A throughout transfers for  stability  Ambulation/Gait Ambulation/Gait assistance: Min assist;Min guard Gait Distance (Feet): 150 Feet Assistive device: 1 person hand held assist;IV Pole Gait Pattern/deviations: Step-through pattern;Decreased stride length;Drifts right/left Gait velocity: decreased   General Gait Details: mild unsteadiness throughout - requires use of IV pole and 1 HHA from PT to maintain stability  Stairs            Wheelchair Mobility    Modified Rankin (Stroke Patients Only)       Balance Overall balance assessment: Needs assistance Sitting-balance support: No upper extremity supported;Feet supported Sitting balance-Leahy Scale: Fair     Standing balance support: Single extremity supported;Bilateral upper extremity supported;During functional activity Standing balance-Leahy Scale: Poor Standing balance comment: reliant on at least 1 UE support to maintain balance                             Pertinent Vitals/Pain Pain Assessment: No/denies pain    Home Living Family/patient expects to be discharged to:: Private residence Living Arrangements: Alone Available Help at Discharge: Family;Available PRN/intermittently Type of Home: House Home Access: Level entry     Home Layout: One level Home Equipment: None      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     Hand Dominance        Extremity/Trunk Assessment   Upper Extremity Assessment Upper Extremity Assessment: Overall WFL for tasks assessed    Lower Extremity Assessment Lower Extremity Assessment: Generalized weakness    Cervical / Trunk Assessment Cervical / Trunk  Assessment: Normal  Communication   Communication: No difficulties  Cognition Arousal/Alertness: Awake/alert Behavior During Therapy: WFL for tasks assessed/performed Overall Cognitive Status: Within Functional Limits for tasks assessed                                         General Comments General comments (skin integrity, edema, etc.): family present and supportive    Exercises     Assessment/Plan    PT Assessment Patient needs continued PT services  PT Problem List Decreased strength;Decreased activity tolerance;Decreased balance;Decreased mobility;Decreased safety awareness;Decreased knowledge of use of DME       PT Treatment Interventions DME instruction;Gait training;Functional mobility training;Therapeutic activities;Therapeutic exercise;Balance training;Neuromuscular re-education;Patient/family education    PT Goals (Current goals can be found in the Care Plan section)  Acute Rehab PT Goals Patient Stated Goal: regain strength and balance PT Goal Formulation: With patient Time For Goal Achievement: 11/15/18 Potential to Achieve Goals: Good    Frequency Min 4X/week   Barriers to discharge        Co-evaluation               AM-PAC PT "6 Clicks" Mobility  Outcome Measure Help needed turning from your back to your side while in a flat bed without using bedrails?: A Little Help needed moving from lying on your back to sitting on the side of a flat bed without using bedrails?: A Little Help needed moving to and from a bed to a chair (including a wheelchair)?: A Little Help needed standing up from a chair using your arms (e.g., wheelchair or bedside chair)?: A Little Help needed to walk in hospital room?: A Little Help needed climbing 3-5 steps with a railing? : A Lot 6 Click Score: 17    End of Session Equipment Utilized During Treatment: Gait belt Activity Tolerance: Patient tolerated treatment well Patient left: in chair;with call bell/phone within reach;with family/visitor present Nurse Communication: Mobility status PT Visit Diagnosis: Unsteadiness on feet (R26.81);Other abnormalities of gait and mobility (R26.89);Muscle weakness (generalized) (M62.81)    Time: 2929-0903 PT Time Calculation (min) (ACUTE  ONLY): 20 min   Charges:   PT Evaluation $PT Eval Moderate Complexity: 1 Mod         Lanney Gins, PT, DPT Supplemental Physical Therapist 11/01/18 10:23 AM Pager: [phone] Office: [phone]

## 2018-11-01 NOTE — Progress Notes (Signed)
PROGRESS NOTE  [name] ZOX:[id number] DOB: 21-Mar-1956 DOA: 10/28/2018 PCP: Chesley Noon, MD  Brief History   63 year old woman presented with fever and altered mental status, headache, found on floor at home sitting next to couch.  Reported fall at home.  Initially admitted for SIRS, rhabdomyolysis, acute encephalopathy.  Subsequent MRI brain reveled infectious disease in the lateral ventricles most likely meningitis.  A & P  Acute meningitis, suspect bacterial based on CSF studies, presented with acute encephalopathy, presented with word finding difficulties, fever, acute encephalopathy, headache.  Sepsis resolved. --Now afebrile 24 hours  --Dramatically improved.  Continue ceftriaxone, vancomycin and ampicillin pending further recommendations.  CSF culture negative 3 days but obtained long after initiation of antibiotics --Blood culture was contaminant, coag negative staph  Acute rhabdomyolysis, acute kidney injury superimposed on CKD stage III --Clinically resolved, creatinine near normal.  Saline lock IV.  Diabetes mellitus type 2 --Blood sugar remained stable.  Continue sliding scale insulin.  Essential hypertension.  Currently hypertensive. --Blood pressure stable.  Continue Norvasc.  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 --Follow-up with neurosurgery as an outpatient once acute illness has resolved.  PMH transsphenoidal pituitary resection 2006, breast cancer treated with chemotherapy, lumpectomy   Continues to improve.  Continue plan as above.  DVT prophylaxis: SCDs Code Status: Full Family Communication: None Disposition Plan: home   Murray Hodgkins, MD  [facility] Direct contact: see [email or web address]  7PM-7AM contact night coverage as above 11/01/2018, 2:04 PM  LOS: 3 days   Consultants  . Infectious disease . Neurology  Procedures  . LP pending  Antibiotics  . Cefepime . Ceftriaxone 2/21  > . Ampicillin 2/22 > . Vancomycin 2/21 > . Acyclovir 2/21 > 2/23  Interval History/Subjective  Slight headache, otherwise feeling well.  No neck pain.  Objective   Vitals:  Vitals:   10/31/18 2105 11/01/18 0408  BP: (!) 159/89 120/73  Pulse: (!) 102 96  Resp: 18 16  Temp: 98.1 F (36.7 C) 98.5 F (36.9 C)  SpO2: 97% 95%    Exam:  Constitutional:   . Appears calm and comfortable Eyes:  . pupils and irises appear normal . Normal lids  ENMT:  . grossly normal hearing  . Lips appear normal Respiratory:  . CTA bilaterally, no w/r/r.  . Respiratory effort normal. Cardiovascular:  . RRR, no m/r/g . No LE extremity edema   Musculoskeletal:  . RUE, LUE, RLE, LLE   . strength and tone grossly normal Neurologic:  . Grossly nonfocal Psychiatric:  . Mental status o Mood, affect appropriate . judgment and insight appear intact    I have personally reviewed the following:   Today's Data  . Urine output 7050 (questionable)  BUN within normal limits, creatinine improved, 1.25.  Magnesium 1.5.  Hemoglobin stable at 8.8.  Lab Data  . Reviewed  Micro Data  . Blood cultures 1/2 coag negative staph, contaminant . Fourth bottle CSF with 1000 RBC, 2322 WBC with 82% segs . CSF culture pending, no organisms seen, no growth 3 days  Imaging  . Chest x-ray independently reviewed no acute disease . CT head negative . MRI brain debris lateral ventricles primarily suspicious for infectious debris.  Suprasellar mass with abnormal findings.  Cardiology Data  . EKG sinus tachycardia, no acute changes.  Independently reviewed.  Other Data  .   Scheduled Meds: . amLODipine  10 mg Oral Daily  . diclofenac  75 mg Oral BID  . gabapentin  300  mg Oral TID  . insulin aspart  0-15 Units Subcutaneous TID WC  . insulin aspart  0-5 Units Subcutaneous QHS  . iron polysaccharides  150 mg Oral BID WC  . mupirocin ointment  1 application Nasal BID   Continuous Infusions: . sodium  chloride 150 mL/hr at 11/01/18 0624  . ampicillin (OMNIPEN) IV 2 g (11/01/18 1335)  . cefTRIAXone (ROCEPHIN)  IV 2 g (11/01/18 0910)  . vancomycin      Principal Problem:   Acute bacterial meningitis Active Problems:   HTN (hypertension)   Diabetes mellitus type II, non insulin dependent (HCC)   Headache   Acute encephalopathy   Rhabdomyolysis   History of pituitary adenoma   Atherosclerosis   CKD (chronic kidney disease), stage III ([place])   AKI (acute kidney injury) ([place])   Pituitary adenoma ([place])   Sepsis ([place])   LOS: 3 days

## 2018-11-01 NOTE — Progress Notes (Signed)
[facility] Coordinator will follow pt with ID team during this admission to support home infusion pharmacy services as needed at DC.   If patient discharges after hours, please call [phone].   Larry Sierras 11/01/2018, 7:03 AM

## 2018-11-02 ENCOUNTER — Inpatient Hospital Stay: Payer: Self-pay

## 2018-11-02 LAB — CULTURE, BLOOD (ROUTINE X 2)
Culture: NO GROWTH
Special Requests: ADEQUATE

## 2018-11-02 LAB — CSF CULTURE

## 2018-11-02 LAB — CREATININE, SERUM
CREATININE: 1.15 mg/dL — AB (ref 0.44–1.00)
GFR calc non Af Amer: 52 mL/min — ABNORMAL LOW (ref >60)
GFR, EST AFRICAN AMERICAN: 60 mL/min — AB (ref >60)

## 2018-11-02 LAB — GLUCOSE, CAPILLARY
Glucose-Capillary: 152 mg/dL — ABNORMAL HIGH (ref 70–99)
Glucose-Capillary: 176 mg/dL — ABNORMAL HIGH (ref 70–99)
Glucose-Capillary: 131 mg/dL — ABNORMAL HIGH (ref 70–99)
Glucose-Capillary: 174 mg/dL — ABNORMAL HIGH (ref 70–99)

## 2018-11-02 LAB — CSF CULTURE W GRAM STAIN: Culture: NO GROWTH

## 2018-11-02 MED ORDER — TIZANIDINE HCL 4 MG PO TABS
4.0000 mg | ORAL_TABLET | Freq: Every day | ORAL | Status: DC
  Administered 2018-11-02: 4 mg via ORAL
  Filled 2018-11-02: qty 1

## 2018-11-02 NOTE — Progress Notes (Addendum)
PROGRESS NOTE  [name] EHU:[id number] DOB: 11-May-1956 DOA: 10/28/2018 PCP: Chesley Noon, MD  Brief History   63 year old woman presented with fever and altered mental status, headache, found on floor at home sitting next to couch.  Reported fall at home.  Initially admitted for SIRS, rhabdomyolysis, acute encephalopathy.  Subsequent MRI brain reveled infectious disease in the lateral ventricles most likely meningitis.  A & P  Acute bacterial meningitis, presented with acute encephalopathy, presented with word finding difficulties, fever, acute encephalopathy, headache.  Sepsis resolved. Blood culture was contaminant, coag negative staph --Culture was negative but obtain long after initiation of antibiotics.  Condition continues to rapidly improve, seen by infectious disease with recommendation of 9 additional days of IV antibiotics, PICC line and home tomorrow.  Acute rhabdomyolysis, acute kidney injury superimposed on CKD stage III --Essentially resolved with creatinine near normal.  Spontaneous resolution expected.  Diabetes mellitus type 2 --Blood sugars remain stable.  Continue sliding scale insulin.  Essential hypertension.  Currently hypertensive. --Blood pressure remained stable.  Continue Norvasc.  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 --Follow-up with neurosurgery as an outpatient once acute illness has resolved. --I've discussed with neurosurgery practice, office staff will call patient for an outpatient.  PMH transsphenoidal pituitary resection 2006, breast cancer treated with chemotherapy, lumpectomy   Continues to improve.  Plan home tomorrow on IV antibiotics.  DVT prophylaxis: SCDs Code Status: Full Family Communication: None Disposition Plan: home   Murray Hodgkins, MD  [facility] Direct contact: see [email or web address]  7PM-7AM contact night coverage as above 11/02/2018, 2:34 PM  LOS: 4 days   Consultants    . Infectious disease . Neurology  Procedures  . LP pending  Antibiotics  . Cefepime . Ceftriaxone 2/21 > . Ampicillin 2/22 > . Vancomycin 2/21 > . Acyclovir 2/21 > 2/23  Interval History/Subjective  Feels much better. No complaints.  Objective   Vitals:  Vitals:   11/01/18 2354 11/02/18 0543  BP: (!) 176/95 (!) 152/86  Pulse: (!) 107 84  Resp: 16 16  Temp: 98.1 F (36.7 C) 98 F (36.7 C)  SpO2: 100% 96%    Exam: Constitutional:   . Appears calm and comfortable Eyes:  . pupils and irises appear normal . Normal lids  ENMT:  . grossly normal hearing  . Lips appear normal Respiratory:  . CTA bilaterally, no w/r/r.  . Respiratory effort normal. Cardiovascular:  . RRR, no m/r/g Neurologic:  . CN grossly nonfocal Psychiatric:  . Mental status o Mood, affect appropriate   I have personally reviewed the following:   Today's Data   Creatinine stable at 1.15.  Lab Data  .   Micro Data  . Blood cultures 1/2 coag negative staph, contaminant . Fourth bottle CSF with 1000 RBC, 2322 WBC with 82% segs . CSF culture no growth, final  Imaging  . Chest x-ray independently reviewed no acute disease . CT head negative . MRI brain debris lateral ventricles primarily suspicious for infectious debris.  Suprasellar mass with abnormal findings.  Cardiology Data  . EKG sinus tachycardia, no acute changes.  Independently reviewed.  Other Data  .   Scheduled Meds: . amLODipine  10 mg Oral Daily  . diclofenac  75 mg Oral BID  . gabapentin  300 mg Oral TID  . insulin aspart  0-15 Units Subcutaneous TID WC  . insulin aspart  0-5 Units Subcutaneous QHS  . iron polysaccharides  150 mg Oral BID WC  . mupirocin  ointment  1 application Nasal BID  . tiZANidine  4 mg Oral QHS   Continuous Infusions: . ampicillin (OMNIPEN) IV 2 g (11/02/18 1314)  . cefTRIAXone (ROCEPHIN)  IV 2 g (11/02/18 0918)  . vancomycin 1,500 mg (11/02/18 1103)    Principal Problem:   Acute  bacterial meningitis Active Problems:   HTN (hypertension)   Diabetes mellitus type II, non insulin dependent (HCC)   Headache   Acute encephalopathy   Rhabdomyolysis   History of pituitary adenoma   Atherosclerosis   CKD (chronic kidney disease), stage III (HCC)   AKI (acute kidney injury) ([place])   Pituitary adenoma ([place])   Sepsis ([place])   LOS: 4 days   Time 35 minutes, greater than 50% counseling coordination of care, discussion with patient, father at bedside, bedside RN, office staff at [place] [facility] to arrange follow-up.

## 2018-11-02 NOTE — Progress Notes (Signed)
Patient ID: [name], female   DOB: 08-12-1956, 63 y.o.   MRN: [id number]         [facility] for Infectious Disease  Date of Admission:  10/28/2018   Total days of antibiotics 6        Day 5 vancomycin        Day 5 ceftriaxone        Day 4 ampicillin ASSESSMENT: She has acute bacterial meningitis and is improving rapidly on empiric antibiotic therapy.  This is most likely pneumococcal infection.  Listeria is quite unlikely and I will stop ampicillin now.  The most likely reason that her CSF cultures are negative his the antibiotic therapy she took before the lumbar puncture was performed.  I feel it is safe and reasonable to consider completing therapy as an outpatient.  PLAN: 1. Continue vancomycin and ceftriaxone for 9 more days 2. PICC or midline placement 3. Discontinue ampicillin  Diagnosis: Meningitis  Culture Result: Culture negative  No Known Allergies  OPAT Orders Discharge antibiotics: Per pharmacy protocol vancomycin and ceftriaxone Aim for Vancomycin trough 15-20 (unless otherwise indicated) Duration: 14 days End Date: 11/11/2018  [facility] Care Per Protocol:  Labs weekly while on IV antibiotics: _x_ CBC with differential _x_ BMP __ CMP __ CRP __ ESR _x_ Vancomycin trough __ CK  _x_ Please pull PIC at completion of IV antibiotics __ Please leave PIC in place until doctor has seen patient or been notified  Fax weekly labs to [phone]  Clinic Follow Up Appt: 11/11/2018   Principal Problem:   Acute bacterial meningitis Active Problems:   Headache   Acute encephalopathy   Rhabdomyolysis   HTN (hypertension)   Diabetes mellitus type II, non insulin dependent ([place])   History of pituitary adenoma   Atherosclerosis   CKD (chronic kidney disease), stage III (HCC)   AKI (acute kidney injury) ([place])   Pituitary adenoma ([place])   Sepsis ([place])   Scheduled Meds: . amLODipine  10 mg Oral Daily  . diclofenac  75 mg Oral BID  . gabapentin   300 mg Oral TID  . insulin aspart  0-15 Units Subcutaneous TID WC  . insulin aspart  0-5 Units Subcutaneous QHS  . iron polysaccharides  150 mg Oral BID WC  . mupirocin ointment  1 application Nasal BID   Continuous Infusions: . ampicillin (OMNIPEN) IV 2 g (11/02/18 0744)  . cefTRIAXone (ROCEPHIN)  IV 2 g (11/02/18 0918)  . vancomycin 1,500 mg (11/01/18 1737)   PRN Meds:.acetaminophen **OR** acetaminophen, hydrALAZINE, ondansetron **OR** ondansetron (ZOFRAN) IV   SUBJECTIVE: She is feeling much better and essentially back to normal.  She recalls that she was having difficulty speaking and finding words last week but says that she feels like her speech is completely back to normal.  She has no more headache.  Review of Systems: Review of Systems  Constitutional: Negative for chills, diaphoresis and fever.  HENT: Negative for hearing loss.        She has not had unusual rhinorrhea (i.e., CSF leak) since her pituitary surgery several years ago.  Neurological: Negative for headaches.    No Known Allergies  OBJECTIVE: Vitals:   11/01/18 1415 11/01/18 1955 11/01/18 2354 11/02/18 0543  BP: (!) 166/92 (!) 170/92 (!) 176/95 (!) 152/86  Pulse: 98 (!) 102 (!) 107 84  Resp: '18 16 16 16  '$ Temp: (!) 97.5 F (36.4 C) 97.9 F (36.6 C) 98.1 F (36.7 C) 98 F (36.7 C)  TempSrc: Oral  SpO2: 100% 97% 100% 96%  Weight:      Height:       Body mass index is 39.87 kg/m.  Physical Exam Constitutional:      Comments: She is sitting up in a chair.  She is talkative and in good spirits.  Neck:     Musculoskeletal: Neck supple.  Cardiovascular:     Rate and Rhythm: Normal rate and regular rhythm.     Heart sounds: No murmur.  Pulmonary:     Effort: Pulmonary effort is normal.     Breath sounds: Normal breath sounds.  Neurological:     Comments: Her speech is back to normal.  Psychiatric:        Mood and Affect: Mood normal.     Lab Results Lab Results  Component Value Date    WBC 5.0 11/01/2018   HGB 8.8 (L) 11/01/2018   HCT 29.8 (L) 11/01/2018   MCV 94.3 11/01/2018   PLT 221 11/01/2018    Lab Results  Component Value Date   CREATININE 1.15 (H) 11/02/2018   BUN 14 11/01/2018   NA 143 11/01/2018   K 3.7 11/01/2018   CL 113 (H) 11/01/2018   CO2 22 11/01/2018    Lab Results  Component Value Date   ALT 25 10/28/2018   AST 41 10/28/2018   ALKPHOS 88 10/28/2018   BILITOT 0.7 10/28/2018     Microbiology: Recent Results (from the past 240 hour(s))  Blood Culture (routine x 2)     Status: Abnormal   Collection Time: 10/28/18  8:37 PM  Result Value Ref Range Status   Specimen Description   Final    BLOOD LEFT ANTECUBITAL Performed at [facility], [place] [address] [place], [place] [address]    Special Requests   Final    BOTTLES DRAWN AEROBIC AND ANAEROBIC Blood Culture adequate volume Performed at [place] [address], [place], [place] [address]    Culture  Setup Time   Final    AEROBIC BOTTLE ONLY GRAM POSITIVE COCCI Organism ID to follow CRITICAL RESULT CALLED TO, READ BACK BY AND VERIFIED WITH: Shelda Jakes [facility] 10/29/18 2012 JDW Performed at [facility], [place] [address] [place], [place] [address]    Culture (A)  Final    STAPHYLOCOCCUS SPECIES (COAGULASE NEGATIVE) THE SIGNIFICANCE OF ISOLATING THIS ORGANISM FROM A SINGLE SET OF BLOOD CULTURES WHEN MULTIPLE SETS ARE DRAWN IS UNCERTAIN. PLEASE NOTIFY THE MICROBIOLOGY DEPARTMENT WITHIN ONE WEEK IF SPECIATION AND SENSITIVITIES ARE REQUIRED.    Report Status 10/31/2018 FINAL  Final  Blood Culture ID Panel (Reflexed)     Status: Abnormal   Collection Time: 10/28/18  8:37 PM  Result Value Ref Range Status   Enterococcus species NOT DETECTED NOT DETECTED Final   Listeria monocytogenes NOT DETECTED NOT DETECTED Final   Staphylococcus species DETECTED (A) NOT DETECTED Final    Comment: Methicillin (oxacillin) susceptible coagulase negative  staphylococcus. Possible blood culture contaminant (unless isolated from more than one blood culture draw or clinical case suggests pathogenicity). No antibiotic treatment is indicated for blood  culture contaminants. CRITICAL RESULT CALLED TO, READ BACK BY AND VERIFIED WITH: [facility]a [facility] 10/29/18 2012 JDW    Staphylococcus aureus (BCID) NOT DETECTED NOT DETECTED Final   Methicillin resistance NOT DETECTED NOT DETECTED Final   Streptococcus species NOT DETECTED NOT DETECTED Final   Streptococcus agalactiae NOT DETECTED NOT DETECTED Final   Streptococcus pneumoniae NOT DETECTED NOT DETECTED Final  Streptococcus pyogenes NOT DETECTED NOT DETECTED Final   Acinetobacter baumannii NOT DETECTED NOT DETECTED Final   Enterobacteriaceae species NOT DETECTED NOT DETECTED Final   Enterobacter cloacae complex NOT DETECTED NOT DETECTED Final   Escherichia coli NOT DETECTED NOT DETECTED Final   Klebsiella oxytoca NOT DETECTED NOT DETECTED Final   Klebsiella pneumoniae NOT DETECTED NOT DETECTED Final   Proteus species NOT DETECTED NOT DETECTED Final   Serratia marcescens NOT DETECTED NOT DETECTED Final   Haemophilus influenzae NOT DETECTED NOT DETECTED Final   Neisseria meningitidis NOT DETECTED NOT DETECTED Final   Pseudomonas aeruginosa NOT DETECTED NOT DETECTED Final   Candida albicans NOT DETECTED NOT DETECTED Final   Candida glabrata NOT DETECTED NOT DETECTED Final   Candida krusei NOT DETECTED NOT DETECTED Final   Candida parapsilosis NOT DETECTED NOT DETECTED Final   Candida tropicalis NOT DETECTED NOT DETECTED Final    Comment: Performed at [facility], [place] [address] [place], [place] [address]  Blood Culture (routine x 2)     Status: None (Preliminary result)   Collection Time: 10/28/18  9:13 PM  Result Value Ref Range Status   Specimen Description BLOOD RIGHT WRIST  Final   Special Requests   Final    BOTTLES DRAWN AEROBIC AND ANAEROBIC Blood Culture adequate volume Performed  at [place] [address], [place], [place] [address]    Culture   Final    NO GROWTH 4 DAYS Performed at [facility], [place] [address] [place], [place] [address]    Report Status PENDING  Incomplete  CSF culture     Status: None   Collection Time: 10/29/18  4:30 PM  Result Value Ref Range Status   Specimen Description   Final    CSF Performed at [place] [address], [place], [place] [address]    Special Requests   Final    CSF Performed at [place] [address] [place], [place] [address]    Gram Stain   Final    WBC PRESENT, PREDOMINANTLY PMN NO ORGANISMS SEEN CYTOPIN SMEAR Gram Stain Report Called to,Read Back By and Verified With: Gustavo Lah [id number] @ 2703 BY J SCOTTON Performed at [facility], [place] [address] [place], [place] [address]    Culture   Final    NO GROWTH 3 DAYS Performed at [facility], [place] [address], [place], [place] [address]    Report Status 11/02/2018 FINAL  Final  MRSA PCR Screening     Status: Abnormal   Collection Time: 10/29/18  5:00 PM  Result Value Ref Range Status   MRSA by PCR POSITIVE (A) NEGATIVE Final    Comment:        The GeneXpert MRSA Assay (FDA approved for NASAL specimens only), is one component of a comprehensive MRSA colonization surveillance program. It is not intended to diagnose MRSA infection nor to guide or monitor treatment for MRSA infections. RESULT CALLED TO, READ BACK BY AND VERIFIED WITH: Leda Gauze RN '@2158'$  ON 10/29/2018 JACKSON,K Performed at [facility], [place] [address] [place], [place] [address]     Michel Bickers, [place] [facility] [phone] pager   [phone] cell 11/02/2018, 10:02 AM

## 2018-11-02 NOTE — Progress Notes (Signed)
Discussed PICC placement procedure with patient and family including risks, benefits, and alternatives. Patient requested that PICC be placed tomorrow. Raquel Sarna RN notified.

## 2018-11-02 NOTE — Progress Notes (Signed)
Physical Therapy Treatment Patient Details Name: [name] MRN: [id number] DOB: 1956/02/19 Today's Date: 11/02/2018    History of Present Illness Patient is a 63 y/o female presenting to the ED on 10/28/2018 with AMS. Flu neg, CXR neg, CT head neg, UA neg.  Past medical history significant of HTN, BRCA, DM2. Brain MRI showed previous evidence of transsphenoidal hypophysis ectomy for pituitary adenoma.  She was also noted to have debris in the lateral ventricles concerning for meningitis. Admitted for Acute bacterial meningitis.    PT Comments    Patient seen for mobility progression. Much improved steadiness and motor control with mobility today. Only requiring supervision during ambulation in hallway with patient not relying on external support for balance. Educated patient on recommendation for HHPT/OPPT to ensure return to baseline mobility and balance at discharge. Will continue to follow.     Follow Up Recommendations  Home health PT;Outpatient PT;Supervision for mobility/OOB     Equipment Recommendations  None recommended by PT    Recommendations for Other Services       Precautions / Restrictions Precautions Precautions: Fall Restrictions Weight Bearing Restrictions: No    Mobility  Bed Mobility               General bed mobility comments: up in recliner  Transfers Overall transfer level: Needs assistance Equipment used: None Transfers: Sit to/from Stand Sit to Stand: Supervision         General transfer comment: much improved stability from yesterday  Ambulation/Gait Ambulation/Gait assistance: Supervision Gait Distance (Feet): 200 Feet Assistive device: IV Pole Gait Pattern/deviations: Step-through pattern;Decreased stride length Gait velocity: decreased   General Gait Details: slow steady pace of gait - much improved stability today with patient only needing to push IV pole; no LOB or instability noted   Stairs              Wheelchair Mobility    Modified Rankin (Stroke Patients Only)       Balance Overall balance assessment: Mild deficits observed, not formally tested                                          Cognition Arousal/Alertness: Awake/alert Behavior During Therapy: WFL for tasks assessed/performed Overall Cognitive Status: Within Functional Limits for tasks assessed                                        Exercises      General Comments        Pertinent Vitals/Pain Pain Assessment: No/denies pain    Home Living                      Prior Function            PT Goals (current goals can now be found in the care plan section) Acute Rehab PT Goals Patient Stated Goal: regain strength and balance PT Goal Formulation: With patient Time For Goal Achievement: 11/15/18 Potential to Achieve Goals: Good Progress towards PT goals: Progressing toward goals    Frequency    Min 4X/week      PT Plan Current plan remains appropriate    Co-evaluation              AM-PAC PT "6 Clicks" Mobility   Outcome Measure  Help needed turning from your back to your side while in a flat bed without using bedrails?: A Little Help needed moving from lying on your back to sitting on the side of a flat bed without using bedrails?: A Little Help needed moving to and from a bed to a chair (including a wheelchair)?: A Little Help needed standing up from a chair using your arms (e.g., wheelchair or bedside chair)?: A Little Help needed to walk in hospital room?: A Little Help needed climbing 3-5 steps with a railing? : A Little 6 Click Score: 18    End of Session Equipment Utilized During Treatment: Gait belt Activity Tolerance: Patient tolerated treatment well Patient left: in chair;with call bell/phone within reach Nurse Communication: Mobility status PT Visit Diagnosis: Unsteadiness on feet (R26.81);Other abnormalities of gait and mobility  (R26.89);Muscle weakness (generalized) (M62.81)     Time: 7356-7014 PT Time Calculation (min) (ACUTE ONLY): 14 min  Charges:  $Gait Training: 8-22 mins                     Lanney Gins, PT, DPT Supplemental Physical Therapist 11/02/18 3:17 PM Pager: [phone] Office: [phone]

## 2018-11-02 NOTE — Progress Notes (Signed)
PHARMACY CONSULT NOTE FOR:  OUTPATIENT  PARENTERAL ANTIBIOTIC THERAPY (OPAT)  Indication: bacterial meningitis Regimen: Rocephin 2g IV q12 and Vancomycin 1500mg  IV q24 End date: 11/11/18  IV antibiotic discharge orders are pended. To discharging provider:  please sign these orders via discharge navigator,  Select New Orders & click on the button choice - Manage This Unsigned Work.     Thank you for allowing pharmacy to be a part of this patient's care.  Kara Mead 11/02/2018, 11:02 AM

## 2018-11-03 ENCOUNTER — Encounter (HOSPITAL_COMMUNITY): Payer: Self-pay | Admitting: Nephrology

## 2018-11-03 DIAGNOSIS — E860 Dehydration: Secondary | ICD-10-CM | POA: Diagnosis not present

## 2018-11-03 DIAGNOSIS — G039 Meningitis, unspecified: Secondary | ICD-10-CM | POA: Diagnosis not present

## 2018-11-03 DIAGNOSIS — R509 Fever, unspecified: Secondary | ICD-10-CM

## 2018-11-03 DIAGNOSIS — A4902 Methicillin resistant Staphylococcus aureus infection, unspecified site: Secondary | ICD-10-CM | POA: Diagnosis not present

## 2018-11-03 LAB — GLUCOSE, CAPILLARY
Glucose-Capillary: 151 mg/dL — ABNORMAL HIGH (ref 70–99)
Glucose-Capillary: 150 mg/dL — ABNORMAL HIGH (ref 70–99)

## 2018-11-03 LAB — CREATININE, SERUM
Creatinine, Ser: 1.2 mg/dL — ABNORMAL HIGH (ref 0.44–1.00)
GFR calc Af Amer: 57 mL/min — ABNORMAL LOW (ref >60)
GFR calc non Af Amer: 49 mL/min — ABNORMAL LOW (ref >60)

## 2018-11-03 MED ORDER — CEFTRIAXONE IV (FOR PTA / DISCHARGE USE ONLY): 2.0000 g | Freq: Two times a day (BID) | INTRAVENOUS | 0 refills | Status: DC

## 2018-11-03 MED ORDER — SODIUM CHLORIDE 0.9% FLUSH: 10.0000 mL | INTRAVENOUS | Status: DC | PRN

## 2018-11-03 MED ORDER — VANCOMYCIN IV (FOR PTA / DISCHARGE USE ONLY): 1500.0000 mg | INTRAVENOUS | 0 refills | Status: DC

## 2018-11-03 NOTE — Progress Notes (Signed)
Peripherally Inserted Central Catheter/Midline Placement  The IV Nurse has discussed with the patient and/or persons authorized to consent for the patient, the purpose of this procedure and the potential benefits and risks involved with this procedure.  The benefits include less needle sticks, lab draws from the catheter, and the patient may be discharged home with the catheter. Risks include, but not limited to, infection, bleeding, blood clot (thrombus formation), and puncture of an artery; nerve damage and irregular heartbeat and possibility to perform a PICC exchange if needed/ordered by physician.  Alternatives to this procedure were also discussed.  Bard Power PICC patient education guide, fact sheet on infection prevention and patient information card has been provided to patient /or left at bedside.    PICC/Midline Placement Documentation  PICC Single Lumen 97/94/80 PICC Right Basilic 38 cm 0 cm (Active)  Indication for Insertion or Continuance of Line Home intravenous therapies (PICC only) 11/03/2018 12:07 PM  Exposed Catheter (cm) 0 cm 11/03/2018 12:07 PM  Site Assessment Clean;Dry;Intact 11/03/2018 12:07 PM  Line Status Flushed;Saline locked;Blood return noted 11/03/2018 12:07 PM  Dressing Type Transparent 11/03/2018 12:07 PM  Dressing Status Clean;Dry;Intact 11/03/2018 12:07 PM  Dressing Intervention New dressing 11/03/2018 12:07 PM  Dressing Change Due 11/10/18 11/03/2018 12:07 PM       Estevon Fluke, Nicolette Bang 11/03/2018, 12:08 PM

## 2018-11-03 NOTE — Progress Notes (Signed)
Patient ID: [name], female   DOB: 09/29/1955, 63 y.o.   MRN: [id number]          [facility] for Infectious Disease    Date of Admission:  10/28/2018   Day 6 vancomycin and ceftriaxone         She is feeling much better and back to her normal baseline.  She has no headache.  She has a new right arm PICC.  She is meeting with Carolynn Sayers ([place]) receiving instruction on how to administer her IV antibiotics at home.  She will follow-up with me in clinic on 11/11/2018.         Diagnosis: Meningitis  Culture Result: Culture negative  No Known Allergies  OPAT Orders Discharge antibiotics: Per pharmacy protocol vancomycin and ceftriaxone Aim for Vancomycin trough 15-20 (unless otherwise indicated) Duration: 14 days End Date: 11/11/2018  [facility] Care Per Protocol:  Labs weekly while on IV antibiotics: _x_ CBC with differential _x_ BMP __ CMP __ CRP __ ESR _x_ Vancomycin trough __ CK  _x_ Please pull PIC at completion of IV antibiotics __ Please leave PIC in place until doctor has seen patient or been notified  Fax weekly labs to [phone]  Clinic Follow Up Appt: 11/11/2018   Michel Bickers, [place] [phone] pager   [phone] cell 11/03/2018, 3:33 PM

## 2018-11-03 NOTE — Discharge Summary (Signed)
Physician Discharge Summary  Patient ID: [name] MRN: [id number] DOB/AGE: Feb 27, 1956 63 y.o.  Admit date: 10/28/2018 Discharge date: 11/03/2018  Admission Diagnoses: Principal Problem:   Acute bacterial meningitis Active Problems:   HTN (hypertension)   Diabetes mellitus type II, non insulin dependent (HCC)   Headache   Acute encephalopathy   Rhabdomyolysis   History of pituitary adenoma   Atherosclerosis   CKD (chronic kidney disease), stage III (HCC)   AKI (acute kidney injury) ([place])   Pituitary adenoma ([place])   Sepsis ([place])  Discharge Diagnoses:  Principal Problem:   Acute bacterial meningitis Active Problems:   HTN (hypertension)   Diabetes mellitus type II, non insulin dependent (HCC)   Headache   Acute encephalopathy   Rhabdomyolysis   History of pituitary adenoma   Atherosclerosis   CKD (chronic kidney disease), stage III (HCC)   AKI (acute kidney injury) ([place])   Pituitary adenoma ([place])   Sepsis ([place])   Discharged Condition: good  Presentation Summary: 63 year old woman presented with fever and altered mental status, headache, found on floor at home sitting next to couch.  Reported fall at home.  Initially admitted for SIRS, rhabdomyolysis, acute encephalopathy.  Subsequent MRI brain reveled infection in the lateral ventricles most likely meningitis.  Hospital Course:  Acute bacterial meningitis: per ID they think she had a bacterial meningitis w/ the presentation and imaging. Pt presented with acute encephalopathy, presented with word finding difficulties, fever, acute encephalopathy, headache.  Sepsis resolved. Blood culture was contaminant, coag negative staph.  -- ID felt that culture was negative due to being obtained well after initiation of antibiotics.  Improved rapidly on IV abs and was seen by infectious disease with recommendation of 9 additional days of IV antibiotics, PICC line and home today >>  -- plan today is dc home on 9 days of home IV  vanc and Rocephin  Acute rhabdomyolysis, acute kidney injury superimposed on CKD stage III --Essentially resolved with creatinine near normal -- dc creat is 1.2  Diabetes mellitus type 2 --Blood sugars remain stable.  -- resuming home glyburide and metformin at dc  Essential hypertension.  Currently hypertensive. --Blood pressure remained stable.  Continue Norvasc. Holding lisinopril / HCT while getting IV vancomycin (renal protection).   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 --Follow-up with neurosurgery as an outpatient once acute illness has resolved. --I've discussed with neurosurgery practice, office staff will call patient for an outpatient.  PMH transsphenoidal pituitary resection 2006  Breast cancer treated with chemotherapy, lumpectomy in 2015    Discharge Exam: Blood pressure (!) 145/88, pulse 96, temperature (!) 97.3 F (36.3 C), resp. rate 16, height _0  (1.676 m), weight 112 kg, SpO2 97 %. Pt is alert, pleasant and in good spirits  no jvd  Chest cta Bilat  Cor reg no mrg  Abd soft , ntnd no mass or ascites  Ext no edema   RUE +PICC line in placed  NF , ox 3  Disposition: Discharge disposition: 01-Home or Self Care       Discharge Instructions    Home infusion instructions [place] May follow [place] Dosing Protocol; May administer Cathflo as needed to maintain patency of vascular access device.; [place]ushing of vascular access device: per [facility] Protocol: 0.9% NaCl pre/post medica...   Complete by:  As directed    Instructions:  May follow [place] Dosing Protocol   Instructions:  May administer Cathflo as needed to maintain patency of vascular access  device.   Instructions:  Flushing of vascular access device: per [facility] Protocol: 0.9% NaCl pre/post medication administration and prn patency; Heparin 100 u/ml, 62m for implanted ports and Heparin 10u/ml, 529mfor all other central venous catheters.    Instructions:  May follow [facility] Anaphylaxis Protocol for First Dose Administration in the home: 0.9% NaCl at 25-50 ml/hr to maintain IV access for protocol meds. Epinephrine 0.3 ml IV/IM PRN and Benadryl 25-50 IV/IM PRN s/s of anaphylaxis.   Instructions:  [facility] Coordinator (RN) to assist per patient IV care needs in the home PRN.     Allergies as of 11/03/2018   No Known Allergies     Medication List    STOP taking these medications   amoxicillin 875 MG tablet Commonly known as:  AMOXIL   diclofenac 75 MG EC tablet Commonly known as:  VOLTAREN   lisinopril-hydrochlorothiazide 20-12.5 MG tablet Commonly known as:  PRINZIDE,ZESTORETIC     TAKE these medications   amLODipine 10 MG tablet Commonly known as:  NORVASC Take 10 mg by mouth daily.   atorvastatin 40 MG tablet Commonly known as:  LIPITOR Take 40 mg by mouth daily.   cefTRIAXone  IVPB Commonly known as:  ROCEPHIN Inject 2 g into the vein every 12 (twelve) hours for 9 days. Indication:  Bacterial meningitis Last Day of Therapy:  11/11/18 Labs - Once weekly:  CBC/D and BMP, Labs - Every other week:  ESR and CRP   FERREX 150 150 MG capsule Generic drug:  iron polysaccharides Take 150 mg by mouth 2 (two) times daily.   gabapentin 300 MG capsule Commonly known as:  NEURONTIN TAKE 1 CAPSULE BY MOUTH AT BEDTIME What changed:  when to take this   glucose monitoring kit monitoring kit One Touch Ultra machine and strips, patient tests her blood sugars bid   glyBURIDE 5 MG tablet Commonly known as:  DIABETA Take 5 mg by mouth 2 (two) times daily with a meal.   metFORMIN 500 MG 24 hr tablet Commonly known as:  GLUCOPHAGE-XR Take 1,000 mg by mouth daily with breakfast.   ONE TOUCH ULTRA TEST test strip Generic drug:  glucose blood   tiZANidine 4 MG tablet Commonly known as:  ZANAFLEX Take 4 mg by mouth every 8 (eight) hours as needed for muscle spasms.   vancomycin  IVPB Inject 1,500 mg into  the vein daily for 9 days. Indication:  Bacterial meningitis Last Day of Therapy:  11/11/18 Please Check 1st Vancomycin Trough prior to vanc dose on 2/27 - goal trough 15-20 Labs - Sunday/Monday:  CBC/D, BMP, and vancomycin trough. Labs - Thursday:  BMP and vancomycin trough Labs - Every other week:  ESR and CRP   Vitamin D (Ergocalciferol) 1.25 MG (50000 UT) Caps capsule Commonly known as:  DRISDOL Take 50,000 Units by mouth once a week.            Home Infusion Instuctions  (From admission, onward)         Start     Ordered   11/03/18 0000  Home infusion instructions [facility] May follow [facility] [place] Protocol; May administer Cathflo as needed to maintain patency of vascular access device.; [place] [facility] vascular [facility]: 0.9% NaCl pre/post medica...    Question Answer Comment  Instructions [facility] follow [facility] [place] Protocol   Instructions May administer Cathflo as needed to maintain patency of vascular access device.   Instructions Flushing of vascular access device:  per [facility] Protocol: 0.9% NaCl pre/post medication administration and prn patency; Heparin 100 u/ml, 74m for implanted ports and Heparin 10u/ml, 552mfor all other central venous catheters.   Instructions May follow [facility] Anaphylaxis Protocol for First Dose Administration in the home: 0.9% NaCl at 25-50 ml/hr to maintain IV access for protocol meds. Epinephrine 0.3 ml IV/IM PRN and Benadryl 25-50 IV/IM PRN s/s of anaphylaxis.   Instructions [facility] Infusion Coordinator (RN) to assist per patient IV care needs in the home PRN.      11/03/18 1541           Signed: RoSol Blazing/26/2020, 3:43 PM

## 2018-11-04 DIAGNOSIS — H409 Unspecified glaucoma: Secondary | ICD-10-CM | POA: Diagnosis not present

## 2018-11-04 DIAGNOSIS — I700 Atherosclerosis of aorta: Secondary | ICD-10-CM | POA: Diagnosis not present

## 2018-11-04 DIAGNOSIS — Z452 Encounter for adjustment and management of vascular access device: Secondary | ICD-10-CM | POA: Diagnosis not present

## 2018-11-04 DIAGNOSIS — I129 Hypertensive chronic kidney disease with stage 1 through stage 4 chronic kidney disease, or unspecified chronic kidney disease: Secondary | ICD-10-CM | POA: Diagnosis not present

## 2018-11-04 DIAGNOSIS — E1122 Type 2 diabetes mellitus with diabetic chronic kidney disease: Secondary | ICD-10-CM | POA: Diagnosis not present

## 2018-11-04 DIAGNOSIS — Z5181 Encounter for therapeutic drug level monitoring: Secondary | ICD-10-CM | POA: Diagnosis not present

## 2018-11-04 DIAGNOSIS — E7800 Pure hypercholesterolemia, unspecified: Secondary | ICD-10-CM | POA: Diagnosis not present

## 2018-11-04 DIAGNOSIS — R2681 Unsteadiness on feet: Secondary | ICD-10-CM | POA: Diagnosis not present

## 2018-11-04 DIAGNOSIS — E236 Other disorders of pituitary gland: Secondary | ICD-10-CM | POA: Diagnosis not present

## 2018-11-04 DIAGNOSIS — N183 Chronic kidney disease, stage 3 (moderate): Secondary | ICD-10-CM | POA: Diagnosis not present

## 2018-11-04 DIAGNOSIS — G009 Bacterial meningitis, unspecified: Secondary | ICD-10-CM | POA: Diagnosis not present

## 2018-11-04 DIAGNOSIS — G9340 Encephalopathy, unspecified: Secondary | ICD-10-CM | POA: Diagnosis not present

## 2018-11-04 DIAGNOSIS — R4701 Aphasia: Secondary | ICD-10-CM | POA: Diagnosis not present

## 2018-11-08 DIAGNOSIS — E7800 Pure hypercholesterolemia, unspecified: Secondary | ICD-10-CM | POA: Diagnosis not present

## 2018-11-08 DIAGNOSIS — I129 Hypertensive chronic kidney disease with stage 1 through stage 4 chronic kidney disease, or unspecified chronic kidney disease: Secondary | ICD-10-CM | POA: Diagnosis not present

## 2018-11-08 DIAGNOSIS — G9340 Encephalopathy, unspecified: Secondary | ICD-10-CM | POA: Diagnosis not present

## 2018-11-08 DIAGNOSIS — G009 Bacterial meningitis, unspecified: Secondary | ICD-10-CM | POA: Diagnosis not present

## 2018-11-08 DIAGNOSIS — I700 Atherosclerosis of aorta: Secondary | ICD-10-CM | POA: Diagnosis not present

## 2018-11-08 DIAGNOSIS — N179 Acute kidney failure, unspecified: Secondary | ICD-10-CM | POA: Diagnosis not present

## 2018-11-08 DIAGNOSIS — N183 Chronic kidney disease, stage 3 (moderate): Secondary | ICD-10-CM | POA: Diagnosis not present

## 2018-11-08 DIAGNOSIS — E1122 Type 2 diabetes mellitus with diabetic chronic kidney disease: Secondary | ICD-10-CM | POA: Diagnosis not present

## 2018-11-08 DIAGNOSIS — Z5181 Encounter for therapeutic drug level monitoring: Secondary | ICD-10-CM | POA: Diagnosis not present

## 2018-11-08 DIAGNOSIS — E236 Other disorders of pituitary gland: Secondary | ICD-10-CM | POA: Diagnosis not present

## 2018-11-08 DIAGNOSIS — Z452 Encounter for adjustment and management of vascular access device: Secondary | ICD-10-CM | POA: Diagnosis not present

## 2018-11-08 DIAGNOSIS — R2681 Unsteadiness on feet: Secondary | ICD-10-CM | POA: Diagnosis not present

## 2018-11-08 DIAGNOSIS — A411 Sepsis due to other specified staphylococcus: Secondary | ICD-10-CM | POA: Diagnosis not present

## 2018-11-08 DIAGNOSIS — H409 Unspecified glaucoma: Secondary | ICD-10-CM | POA: Diagnosis not present

## 2018-11-08 DIAGNOSIS — R4701 Aphasia: Secondary | ICD-10-CM | POA: Diagnosis not present

## 2018-11-11 ENCOUNTER — Ambulatory Visit (INDEPENDENT_AMBULATORY_CARE_PROVIDER_SITE_OTHER): Payer: BLUE CROSS/BLUE SHIELD | Admitting: Internal Medicine

## 2018-11-11 ENCOUNTER — Telehealth: Payer: Self-pay

## 2018-11-11 ENCOUNTER — Encounter: Payer: Self-pay | Admitting: Internal Medicine

## 2018-11-11 ENCOUNTER — Other Ambulatory Visit: Payer: Self-pay

## 2018-11-11 DIAGNOSIS — G9340 Encephalopathy, unspecified: Secondary | ICD-10-CM | POA: Diagnosis not present

## 2018-11-11 DIAGNOSIS — E7800 Pure hypercholesterolemia, unspecified: Secondary | ICD-10-CM | POA: Diagnosis not present

## 2018-11-11 DIAGNOSIS — R4701 Aphasia: Secondary | ICD-10-CM | POA: Diagnosis not present

## 2018-11-11 DIAGNOSIS — Z5181 Encounter for therapeutic drug level monitoring: Secondary | ICD-10-CM | POA: Diagnosis not present

## 2018-11-11 DIAGNOSIS — G039 Meningitis, unspecified: Secondary | ICD-10-CM

## 2018-11-11 DIAGNOSIS — G009 Bacterial meningitis, unspecified: Secondary | ICD-10-CM | POA: Diagnosis not present

## 2018-11-11 DIAGNOSIS — N183 Chronic kidney disease, stage 3 (moderate): Secondary | ICD-10-CM | POA: Diagnosis not present

## 2018-11-11 DIAGNOSIS — Z452 Encounter for adjustment and management of vascular access device: Secondary | ICD-10-CM | POA: Diagnosis not present

## 2018-11-11 DIAGNOSIS — A499 Bacterial infection, unspecified: Secondary | ICD-10-CM | POA: Diagnosis not present

## 2018-11-11 DIAGNOSIS — E236 Other disorders of pituitary gland: Secondary | ICD-10-CM | POA: Diagnosis not present

## 2018-11-11 DIAGNOSIS — I700 Atherosclerosis of aorta: Secondary | ICD-10-CM | POA: Diagnosis not present

## 2018-11-11 DIAGNOSIS — E1122 Type 2 diabetes mellitus with diabetic chronic kidney disease: Secondary | ICD-10-CM | POA: Diagnosis not present

## 2018-11-11 DIAGNOSIS — R2681 Unsteadiness on feet: Secondary | ICD-10-CM | POA: Diagnosis not present

## 2018-11-11 DIAGNOSIS — I129 Hypertensive chronic kidney disease with stage 1 through stage 4 chronic kidney disease, or unspecified chronic kidney disease: Secondary | ICD-10-CM | POA: Diagnosis not present

## 2018-11-11 DIAGNOSIS — H409 Unspecified glaucoma: Secondary | ICD-10-CM | POA: Diagnosis not present

## 2018-11-11 NOTE — Progress Notes (Signed)
[name] for Infectious Disease  Patient Active Problem List   Diagnosis Date Noted  . Headache 10/29/2018    Priority: High  . Acute encephalopathy 10/29/2018    Priority: High  . Rhabdomyolysis 10/29/2018    Priority: High  . Meningitis 10/29/2018    Priority: High  . Febrile illness   . AKI (acute kidney injury) ([place]) 10/30/2018  . Pituitary adenoma ([place]) 10/30/2018  . Acute bacterial meningitis 10/30/2018  . Sepsis ([place]) 10/30/2018  . History of pituitary adenoma 10/29/2018  . Glaucoma 10/29/2018  . Atherosclerosis 10/29/2018  . Acute purulent meningitis 10/29/2018  . CKD (chronic kidney disease), stage III ([place]) 10/29/2018  . Malignant neoplasm of lower-outer quadrant of right breast of female, estrogen receptor negative ([place]) 06/09/2016  . Normocytic anemia 04/27/2014  . HTN (hypertension)   . Diabetes mellitus type II, non insulin dependent ([place])   . Dyslipidemia     Patient's Medications  New Prescriptions   No medications on file  Previous Medications   AMLODIPINE (NORVASC) 10 MG TABLET    Take 10 mg by mouth daily.    ATORVASTATIN (LIPITOR) 40 MG TABLET    Take 40 mg by mouth daily.   FERREX 150 150 MG CAPSULE    Take 150 mg by mouth 2 (two) times daily.   GABAPENTIN (NEURONTIN) 300 MG CAPSULE    TAKE 1 CAPSULE BY MOUTH AT BEDTIME   GLUCOSE MONITORING KIT (FREESTYLE) MONITORING KIT    One Touch Ultra machine and strips, patient tests her blood sugars bid   GLYBURIDE (DIABETA) 5 MG TABLET    Take 5 mg by mouth 2 (two) times daily with a meal.    METFORMIN (GLUCOPHAGE-XR) 500 MG 24 HR TABLET    Take 1,000 mg by mouth daily with breakfast.    ONE TOUCH ULTRA TEST TEST STRIP       TIZANIDINE (ZANAFLEX) 4 MG TABLET    Take 4 mg by mouth every 8 (eight) hours as needed for muscle spasms.   VITAMIN D, ERGOCALCIFEROL, (DRISDOL) 1.25 MG (50000 UT) CAPS CAPSULE    Take 50,000 Units by mouth once a week.  Modified Medications   No medications on file    Discontinued Medications   CEFTRIAXONE (ROCEPHIN) IVPB    Inject 2 g into the vein every 12 (twelve) hours for 9 days. Indication:  Bacterial meningitis Last Day of Therapy:  11/11/18 Labs - Once weekly:  CBC/D and BMP, Labs - Every other week:  ESR and CRP   VANCOMYCIN IVPB    Inject 1,500 mg into the vein daily for 9 days. Indication:  Bacterial meningitis Last Day of Therapy:  11/11/18 Please Check 1st Vancomycin Trough prior to vanc dose on 2/27 - goal trough 15-20 Labs - Sunday/Monday:  CBC/D, BMP, and vancomycin trough. 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  There was a delay in getting a lumbar puncture.  She was started on broad empiric antimicrobial therapy.  Her CSF analyses eventually did show changes compatible with severe bacterial meningitis.  No organisms were seen on Gram stain and cultures were negative.  HSV PCR was negative.  She improved very quickly while in the hospital.  I elected to discharge her on  vancomycin and ceftriaxone.  She has now completed 14 days of therapy.  She says that she feels almost back to normal.  She still notes some slight fatigue and shortness of breath which is not normal but is hoping to go back to work next week.  She has a history of a pituitary adenoma and underwent transsphenoidal hypophysectomy in 2006 by Dr. Arville Care.  She has not had any unusual rhinorrhea since that surgery to suggest CSF leak.  The MRI of her brain showed sellar and suprasellar mass impacting the chiasm compatible with pituitary adenoma  Review of Systems: Review of Systems  Constitutional: Negative for chills, diaphoresis and fever.  HENT: Negative for hearing loss.   Eyes:       No change in her vision.  She says that she has always had  somewhat of a droopy, lazy left eyelid.  Respiratory: Positive for shortness of breath. Negative for cough and sputum production.   Cardiovascular: Negative for chest pain.  Gastrointestinal: Negative for abdominal pain, diarrhea, nausea and vomiting.  Neurological: Negative for headaches.    Past Medical History:  Diagnosis Date  . Breast cancer ([place]) 12/29/13   right 6:00 o'clock, lower outer  . Diabetes mellitus   . Family history of malignant neoplasm of breast   . Glaucoma   . High cholesterol   . History of radiation therapy 08/12/14- 10/11/14   right breast /50.4 Gy/28 fx, right breast boost/ 10 Gy/ 5 fx  . HTN (hypertension)   . Personal history of chemotherapy   . Personal history of radiation therapy   . Pituitary adenoma ([place]) 10/30/2018  . Wears dentures    full top-partial bottom  . Wears glasses     Social History   Tobacco Use  . Smoking status: Former Smoker    Types: Cigarettes    Last attempt to quit: 01/31/1994    Years since quitting: 24.7  . Smokeless tobacco: Never Used  Substance Use Topics  . Alcohol use: Yes    Comment: occ  . Drug use: No    Family History  Problem Relation Age of Onset  . Hypertension Mother   . Diabetes type II Mother   . Breast cancer Mother        dx ~69; deceased 60  . Hypertension Father   . Breast cancer Maternal Aunt        deceased 42  . Cancer Maternal Uncle        unk. type; deceased 19s  . Cancer Maternal Uncle        unk. type; deceased late 51s  . Uterine cancer Cousin 43       daughter of an unaffected mat aunt who is 67    No Known Allergies  Objective: Vitals:   11/11/18 1438  BP: (!) 166/92  Pulse: (!) 103  Temp: 97.8 F (36.6 C)  TempSrc: Oral  Weight: 254 lb (115.2 kg)   Body mass index is 41 kg/m.  Physical Exam Constitutional:      Comments: She is smiling and in good spirits.  HENT:     Mouth/Throat:     Pharynx: No oropharyngeal exudate.  Eyes:     Conjunctiva/sclera:  Conjunctivae normal.  Neck:     Musculoskeletal: Neck supple.  Cardiovascular:     Rate and Rhythm: Normal rate and regular rhythm.     Heart sounds: No murmur.  Pulmonary:     Effort: Pulmonary effort is normal.     Breath  sounds: Normal breath sounds.  Skin:    Findings: No rash.     Comments: Right arm PIC site looks good.  Neurological:     General: No focal deficit present.     Mental Status: She is oriented to person, place, and time.     Gait: Gait normal.     Comments: She does have some mild ptosis of her left eye but she states this is her baseline.  Her speech is normal.  Psychiatric:        Mood and Affect: Mood normal.     Lab Results    Problem List Items Addressed This Visit      High   Meningitis    I suspect that she had pneumococcal meningitis and that her CSF cultures were negative because of receiving antibiotics prior to her lumbar puncture.  She responded very promptly to antibiotic therapy and is now almost back to her normal baseline.  She has had 14 days of empiric antibiotic therapy.  I will stop vancomycin and ceftriaxone and have her PICC pulled.  I would like to get her back to see Dr. Lucien Mons for further evaluation of the pituitary findings on MRI.  She will follow-up here in 4 weeks.          Michel Bickers, MD [facility] for Infectious [place] [facility] [phone] pager   [phone] cell 11/11/2018, 3:20 PM

## 2018-11-11 NOTE — Progress Notes (Addendum)
Per Dr Megan Salon 38cm single lumen peripherally inserted central atheter  removed from right basilic . No sutures present. Dressing was clean and dry. Area cleansed with chlorhexidine and petroleum dressing applied. Pt advised no heavy lifting with this arm, leave dressing for 24 hours and call the office if dressing becomes soaked with blood or sharp pain presents.  Pt tolerated procedure well.    Laverle Patter, RN

## 2018-11-11 NOTE — Telephone Encounter (Signed)
[place] to inform them patient's picc line was removed at office today by Rn. Spoke with Jackelyn Poling who was able to take my call and will document picc line removal in their chart. [place]

## 2018-11-11 NOTE — Assessment & Plan Note (Signed)
I suspect that she had pneumococcal meningitis and that her CSF cultures were negative because of receiving antibiotics prior to her lumbar puncture.  She responded very promptly to antibiotic therapy and is now almost back to her normal baseline.  She has had 14 days of empiric antibiotic therapy.  I will stop vancomycin and ceftriaxone and have her PICC pulled.  I would like to get her back to see Dr. Lucien Mons for further evaluation of the pituitary findings on MRI.  She will follow-up here in 4 weeks.

## 2018-11-12 ENCOUNTER — Telehealth: Payer: Self-pay | Admitting: *Deleted

## 2018-11-12 DIAGNOSIS — E236 Other disorders of pituitary gland: Secondary | ICD-10-CM | POA: Diagnosis not present

## 2018-11-12 DIAGNOSIS — N183 Chronic kidney disease, stage 3 (moderate): Secondary | ICD-10-CM | POA: Diagnosis not present

## 2018-11-12 DIAGNOSIS — G9340 Encephalopathy, unspecified: Secondary | ICD-10-CM | POA: Diagnosis not present

## 2018-11-12 DIAGNOSIS — Z452 Encounter for adjustment and management of vascular access device: Secondary | ICD-10-CM | POA: Diagnosis not present

## 2018-11-12 DIAGNOSIS — R4701 Aphasia: Secondary | ICD-10-CM | POA: Diagnosis not present

## 2018-11-12 DIAGNOSIS — E7800 Pure hypercholesterolemia, unspecified: Secondary | ICD-10-CM | POA: Diagnosis not present

## 2018-11-12 DIAGNOSIS — G009 Bacterial meningitis, unspecified: Secondary | ICD-10-CM | POA: Diagnosis not present

## 2018-11-12 DIAGNOSIS — H409 Unspecified glaucoma: Secondary | ICD-10-CM | POA: Diagnosis not present

## 2018-11-12 DIAGNOSIS — I129 Hypertensive chronic kidney disease with stage 1 through stage 4 chronic kidney disease, or unspecified chronic kidney disease: Secondary | ICD-10-CM | POA: Diagnosis not present

## 2018-11-12 DIAGNOSIS — I700 Atherosclerosis of aorta: Secondary | ICD-10-CM | POA: Diagnosis not present

## 2018-11-12 DIAGNOSIS — R2681 Unsteadiness on feet: Secondary | ICD-10-CM | POA: Diagnosis not present

## 2018-11-12 DIAGNOSIS — Z5181 Encounter for therapeutic drug level monitoring: Secondary | ICD-10-CM | POA: Diagnosis not present

## 2018-11-12 DIAGNOSIS — E1122 Type 2 diabetes mellitus with diabetic chronic kidney disease: Secondary | ICD-10-CM | POA: Diagnosis not present

## 2018-11-12 NOTE — Telephone Encounter (Signed)
Patient's father walked into clinic to ask for a return to work note for [employer]. RN left message on patient's voicemail asking for her office fax number so we can send it directly for her. She is following with Dr Constance Holster.  Please advise if patient may return to work 3/9. She will need a letter releasing her, with date of return.  Landis Gandy, RN

## 2018-11-15 ENCOUNTER — Encounter: Payer: Self-pay | Admitting: Internal Medicine

## 2018-11-15 ENCOUNTER — Telehealth: Payer: Self-pay | Admitting: Internal Medicine

## 2018-11-15 NOTE — Telephone Encounter (Signed)
Faxed letter to patient job as requested. Confirmation received today.  [place]

## 2018-11-15 NOTE — Telephone Encounter (Signed)
Patient calling office regarding letter for work. Patient states she would like her to state she can return to work on Wednesday. Letter will need to faxed to [phone]. [place]

## 2018-11-15 NOTE — Telephone Encounter (Signed)
Patient called back and left a message this morning asking if she can return to work Wednesday November 17, 2018.  Will call patient back with an update. Pricilla Riffle RN

## 2018-11-15 NOTE — Telephone Encounter (Signed)
She may return to work right away.

## 2018-11-19 DIAGNOSIS — D352 Benign neoplasm of pituitary gland: Secondary | ICD-10-CM | POA: Diagnosis not present

## 2018-11-19 DIAGNOSIS — Z87891 Personal history of nicotine dependence: Secondary | ICD-10-CM | POA: Diagnosis not present

## 2018-11-24 ENCOUNTER — Other Ambulatory Visit: Payer: Self-pay | Admitting: Family Medicine

## 2018-11-24 DIAGNOSIS — Z853 Personal history of malignant neoplasm of breast: Secondary | ICD-10-CM

## 2018-12-02 DIAGNOSIS — D352 Benign neoplasm of pituitary gland: Secondary | ICD-10-CM | POA: Diagnosis not present

## 2018-12-08 ENCOUNTER — Other Ambulatory Visit: Payer: Self-pay | Admitting: Neurological Surgery

## 2018-12-14 ENCOUNTER — Ambulatory Visit: Payer: BLUE CROSS/BLUE SHIELD | Admitting: Internal Medicine

## 2019-03-03 ENCOUNTER — Other Ambulatory Visit: Payer: Self-pay | Admitting: Adult Health

## 2019-03-03 ENCOUNTER — Other Ambulatory Visit: Payer: Self-pay | Admitting: Family Medicine

## 2019-03-03 DIAGNOSIS — Z853 Personal history of malignant neoplasm of breast: Secondary | ICD-10-CM

## 2019-03-04 ENCOUNTER — Other Ambulatory Visit: Payer: Self-pay

## 2019-03-04 ENCOUNTER — Ambulatory Visit
Admission: RE | Admit: 2019-03-04 | Discharge: 2019-03-04 | Disposition: A | Discharge status: Home / Self Care | Payer: BC Managed Care – PPO | Source: Ambulatory Visit | Attending: Family Medicine | Admitting: Family Medicine

## 2019-03-04 ENCOUNTER — Other Ambulatory Visit: Payer: Self-pay | Admitting: Family Medicine

## 2019-03-04 DIAGNOSIS — Z853 Personal history of malignant neoplasm of breast: Secondary | ICD-10-CM | POA: Diagnosis not present

## 2019-03-04 DIAGNOSIS — R921 Mammographic calcification found on diagnostic imaging of breast: Secondary | ICD-10-CM | POA: Diagnosis not present

## 2019-03-16 DIAGNOSIS — J3489 Other specified disorders of nose and nasal sinuses: Secondary | ICD-10-CM | POA: Diagnosis not present

## 2019-03-16 DIAGNOSIS — D352 Benign neoplasm of pituitary gland: Secondary | ICD-10-CM | POA: Diagnosis not present

## 2019-03-16 DIAGNOSIS — D497 Neoplasm of unspecified behavior of endocrine glands and other parts of nervous system: Secondary | ICD-10-CM | POA: Diagnosis not present

## 2019-03-18 ENCOUNTER — Other Ambulatory Visit (HOSPITAL_COMMUNITY): Payer: BC Managed Care – PPO

## 2019-03-22 ENCOUNTER — Inpatient Hospital Stay (HOSPITAL_COMMUNITY)
Admission: RE | Admit: 2019-03-22 | Discharge: 2019-03-22 | Disposition: A | Discharge status: Home / Self Care | Payer: BC Managed Care – PPO | Source: Ambulatory Visit

## 2019-03-26 ENCOUNTER — Inpatient Hospital Stay (HOSPITAL_COMMUNITY)
Admission: RE | Admit: 2019-03-26 | Discharge: 2019-03-26 | Disposition: A | Discharge status: Home / Self Care | Payer: BC Managed Care – PPO | Source: Ambulatory Visit

## 2019-03-26 NOTE — Progress Notes (Signed)
Message left to change COVID testing day. Pt is too far out from the procedure date.

## 2019-03-30 ENCOUNTER — Ambulatory Visit
Admission: RE | Admit: 2019-03-30 | Discharge: 2019-03-30 | Disposition: A | Discharge status: Home / Self Care | Payer: Self-pay | Source: Ambulatory Visit | Attending: Neurological Surgery | Admitting: Neurological Surgery

## 2019-03-30 ENCOUNTER — Other Ambulatory Visit (HOSPITAL_COMMUNITY): Payer: Self-pay | Admitting: Neurological Surgery

## 2019-03-30 DIAGNOSIS — R52 Pain, unspecified: Secondary | ICD-10-CM

## 2019-04-05 NOTE — Progress Notes (Signed)
[place] ([address] [place] - [address] [place] ([place]) [place] [address] Phone: [phone] Fax: [phone]  [place], [place] - [place] [address] Phone: [phone] Fax: [phone]      Your procedure is scheduled on August 7  Report to [facility] Main Entrance "A" at 0530 A.M., and check in at the Admitting office.  Call this number if you have problems the morning of surgery:  [phone]  Call [phone] if you have any questions prior to your surgery date Monday-Friday 8am-4pm    Remember:  Do not eat or drink after midnight the night before your surgery    Take these medicines the morning of surgery with A SIP OF WATER  amLODipine (NORVASC)  atorvastatin (LIPITOR)  gabapentin (NEURONTIN)   7 days prior to surgery STOP taking any Aspirin (unless otherwise instructed by your surgeon), Aleve, Naproxen, Ibuprofen, Motrin, Advil, Goody's, BC's, all herbal medications, fish oil, and all vitamins.   WHAT DO I DO ABOUT MY DIABETES MEDICATION?   [place] Kitchen Do not take oral diabetes medicines (pills) the morning of surgery. glyBURIDE (DIABETA) and metFORMIN (GLUCOPHAGE-XR) If your CBG is greater than 220 mg/dL, you may take  of your sliding scale (correction) dose of insulin.   How to Manage Your Diabetes Before and After Surgery  Why is it important to control my blood sugar before and after surgery? . Improving blood sugar levels before and after surgery helps healing and can limit problems. . A way of improving blood sugar control is eating a healthy diet by: o  Eating less sugar and carbohydrates o  Increasing activity/exercise o  Talking with your doctor about reaching your blood sugar goals . High blood sugars (greater than 180 mg/dL) can raise your risk of infections and slow your recovery, so you will need to focus on controlling your diabetes  during the weeks before surgery. . Make sure that the doctor who takes care of your diabetes knows about your planned surgery including the date and location.  How do I manage my blood sugar before surgery? . Check your blood sugar at least 4 times a day, starting 2 days before surgery, to make sure that the level is not too high or low. o Check your blood sugar the morning of your surgery when you wake up and every 2 hours until you get to the Short Stay unit. . If your blood sugar is less than 70 mg/dL, you will need to treat for low blood sugar: o Do not take insulin. o Treat a low blood sugar (less than 70 mg/dL) with  cup of clear juice (cranberry or apple), 4 glucose tablets, OR glucose gel. o Recheck blood sugar in 15 minutes after treatment (to make sure it is greater than 70 mg/dL). If your blood sugar is not greater than 70 mg/dL on recheck, call [phone] for further instructions. . Report your blood sugar to the short stay nurse when you get to Short Stay.  . If you are admitted to the hospital after surgery: o Your blood sugar will be checked by the staff and you will probably be given insulin after surgery (instead of oral diabetes medicines) to make sure you have good blood sugar levels. o The goal for blood sugar control after surgery is 80-180 mg/dL.    The Morning of Surgery  Do not wear jewelry, make-up or nail polish.  Do not wear lotions, powders, or perfumes/colognes, or deodorant  Do not shave 48 hours prior to surgery.  Men may shave face and neck.  Do not bring valuables to the hospital.  [facility] is not responsible for any belongings or valuables.  If you are a smoker, DO NOT Smoke 24 hours prior to surgery IF you wear a CPAP at night please bring your mask, tubing, and machine the morning of surgery   Remember that you must have someone to transport you home after your surgery, and remain with you for 24 hours if you are discharged the same  day.   Contacts, glasses, hearing aids, dentures or bridgework may not be worn into surgery.    Leave your suitcase in the car.  After surgery it may be brought to your room.  For patients admitted to the hospital, discharge time will be determined by your treatment team.  Patients discharged the day of surgery will not be allowed to drive home.    Special instructions:   [place]- Preparing For Surgery  Before surgery, you can play an important role. Because skin is not sterile, your skin needs to be as free of germs as possible. You can reduce the number of germs on your skin by washing with CHG (chlorahexidine gluconate) Soap before surgery.  CHG is an antiseptic cleaner which kills germs and bonds with the skin to continue killing germs even after washing.    Oral Hygiene is also important to reduce your risk of infection.  Remember - BRUSH YOUR TEETH THE MORNING OF SURGERY WITH YOUR REGULAR TOOTHPASTE  Please do not use if you have an allergy to CHG or antibacterial soaps. If your skin becomes reddened/irritated stop using the CHG.  Do not shave (including legs and underarms) for at least 48 hours prior to first CHG shower. It is OK to shave your face.  Please follow these instructions carefully.   1. Shower the NIGHT BEFORE SURGERY and the MORNING OF SURGERY with CHG Soap.   2. If you chose to wash your hair, wash your hair first as usual with your normal shampoo.  3. After you shampoo, rinse your hair and body thoroughly to remove the shampoo.  4. Use CHG as you would any other liquid soap. You can apply CHG directly to the skin and wash gently with a scrungie or a clean washcloth.   5. Apply the CHG Soap to your body ONLY FROM THE NECK DOWN.  Do not use on open wounds or open sores. Avoid contact with your eyes, ears, mouth and genitals (private parts). Wash Face and genitals (private parts)  with your normal soap.   6. Wash thoroughly, paying special attention to the  area where your surgery will be performed.  7. Thoroughly rinse your body with warm water from the neck down.  8. DO NOT shower/wash with your normal soap after using and rinsing off the CHG Soap.  9. Pat yourself dry with a CLEAN TOWEL.  10. Wear CLEAN PAJAMAS to bed the night before surgery, wear comfortable clothes the morning of surgery  11. Place CLEAN SHEETS on your bed the night of your first shower and DO NOT SLEEP WITH PETS.    Day of Surgery:  Do not apply any deodorants/lotions. Please shower the morning of surgery with the CHG soap  Please wear clean clothes to the hospital/surgery center.   Remember to brush your teeth WITH YOUR REGULAR TOOTHPASTE.   Please read over the  following fact sheets that you were given.

## 2019-04-06 ENCOUNTER — Encounter (HOSPITAL_COMMUNITY): Payer: Self-pay

## 2019-04-06 ENCOUNTER — Encounter (HOSPITAL_COMMUNITY)
Admission: RE | Admit: 2019-04-06 | Discharge: 2019-04-06 | Disposition: A | Discharge status: Home / Self Care | Payer: BC Managed Care – PPO | Source: Ambulatory Visit | Attending: Neurological Surgery | Admitting: Neurological Surgery

## 2019-04-06 ENCOUNTER — Other Ambulatory Visit: Payer: Self-pay

## 2019-04-06 DIAGNOSIS — Z01812 Encounter for preprocedural laboratory examination: Secondary | ICD-10-CM | POA: Insufficient documentation

## 2019-04-06 HISTORY — DX: Unspecified cataract: H26.9

## 2019-04-06 HISTORY — DX: Anemia, unspecified: D64.9

## 2019-04-06 LAB — CBC
HCT: 32.8 % — ABNORMAL LOW (ref 36.0–46.0)
Hemoglobin: 9.8 g/dL — ABNORMAL LOW (ref 12.0–15.0)
MCH: 26.6 pg (ref 26.0–34.0)
MCHC: 29.9 g/dL — ABNORMAL LOW (ref 30.0–36.0)
MCV: 89.1 fL (ref 80.0–100.0)
Platelets: 213 10*3/uL (ref 150–400)
RBC: 3.68 MIL/uL — ABNORMAL LOW (ref 3.87–5.11)
RDW: 15.5 % (ref 11.5–15.5)
WBC: 5.7 10*3/uL (ref 4.0–10.5)
nRBC: 0 % (ref 0.0–0.2)

## 2019-04-06 LAB — BASIC METABOLIC PANEL
Anion gap: 7 (ref 5–15)
BUN: 36 mg/dL — ABNORMAL HIGH (ref 8–23)
CO2: 22 mmol/L (ref 22–32)
Calcium: 9.9 mg/dL (ref 8.9–10.3)
Chloride: 113 mmol/L — ABNORMAL HIGH (ref 98–111)
Creatinine, Ser: 1.42 mg/dL — ABNORMAL HIGH (ref 0.44–1.00)
GFR calc Af Amer: 46 mL/min — ABNORMAL LOW (ref >60)
GFR calc non Af Amer: 40 mL/min — ABNORMAL LOW (ref >60)
Glucose, Bld: 90 mg/dL (ref 70–99)
Potassium: 4.8 mmol/L (ref 3.5–5.1)
Sodium: 142 mmol/L (ref 135–145)

## 2019-04-06 LAB — GLUCOSE, CAPILLARY: Glucose-Capillary: 127 mg/dL — ABNORMAL HIGH (ref 70–99)

## 2019-04-06 LAB — SURGICAL PCR SCREEN
MRSA, PCR: NEGATIVE
Staphylococcus aureus: NEGATIVE

## 2019-04-06 NOTE — Progress Notes (Signed)
Patient denies shortness of breath, fever, cough and chest pain at PAT appointment  PCP - Dr Veronia Beets Cardiologist - Denies  Chest x-ray - 10/28/18 EKG - 10/29/18 Stress Test - 2006 ECHO - 2015 Cardiac Cath - Denies  Fasting Blood Sugar - 160s but does not check her cbg on a regular basis.  Has not checked her sugar in the last 30 days. Checks Blood Sugar __0-1___ times a day  Anesthesia review: Yes  Coronavirus Screening Have you or your daughter experienced the following symptoms:  Cough yes/no: No Fever (>100.44F)  yes/no: No Runny nose yes/no: No Sore throat yes/no: No Difficulty breathing/shortness of breath  yes/no: No  Have you or your daughter traveled in the last 14 days and where? yes/no: No

## 2019-04-07 NOTE — Anesthesia Preprocedure Evaluation (Addendum)
Anesthesia Evaluation  Patient identified by MRN, date of birth, ID band Patient awake    Reviewed: Allergy & Precautions, NPO status , Patient's Chart, lab work & pertinent test results  Airway Mallampati: II  TM Distance: >3 FB Neck ROM: Full    Dental  (+) Edentulous Upper, Edentulous Lower   Pulmonary former smoker,    Pulmonary exam normal breath sounds clear to auscultation       Cardiovascular hypertension, Pt. on medications Normal cardiovascular exam Rhythm:Regular Rate:Normal  ECG: ST   Neuro/Psych  Headaches, negative psych ROS   GI/Hepatic negative GI ROS, Neg liver ROS,   Endo/Other  diabetes, Oral Hypoglycemic Agents  Renal/GU Renal disease     Musculoskeletal negative musculoskeletal ROS (+)   Abdominal (+) + obese,   Peds  Hematology  (+) anemia , HLD   Anesthesia Other Findings Pituitary adenoma  Reproductive/Obstetrics                           Anesthesia Physical Anesthesia Plan  ASA: III  Anesthesia Plan: General   Post-op Pain Management:    Induction: Intravenous  PONV Risk Score and Plan: 3 and Ondansetron, Dexamethasone, Midazolam and Treatment may vary due to age or medical condition  Airway Management Planned: Oral ETT  Additional Equipment: Arterial line  Intra-op Plan:   Post-operative Plan: Extubation in OR  Informed Consent: I have reviewed the patients History and Physical, chart, labs and discussed the procedure including the risks, benefits and alternatives for the proposed anesthesia with the patient or authorized representative who has indicated his/her understanding and acceptance.       Plan Discussed with: CRNA  Anesthesia Plan Comments: (History of anemia, Hgb 9.8 on preop labs, appears stable. Mild renal insufficiency, baseline creatinine ~1.4.  Recently completed treatment of bacterial meningitis per ID. MRI brain revealed  recurrence of previously resected pituitary adenoma.   TTE 2015: - Left ventricle: The cavity size was normal. Wall thickness was  increased in a pattern of moderate LVH. Systolic function was  normal. The estimated ejection fraction was in the range of 50%  to 55%. Wall motion was normal; there were no regional wall  motion abnormalities. Features are consistent with a pseudonormal  left ventricular filling pattern, with concomitant abnormal  relaxation and increased filling pressure (grade 2 diastolic  dysfunction).  - Left atrium: The atrium was mildly dilated.  )     Anesthesia Quick Evaluation

## 2019-04-11 ENCOUNTER — Other Ambulatory Visit (HOSPITAL_COMMUNITY): Payer: BC Managed Care – PPO

## 2019-04-12 ENCOUNTER — Other Ambulatory Visit (HOSPITAL_COMMUNITY)
Admission: RE | Admit: 2019-04-12 | Discharge: 2019-04-12 | Disposition: A | Discharge status: Home / Self Care | Payer: BC Managed Care – PPO | Source: Ambulatory Visit | Attending: Neurological Surgery | Admitting: Neurological Surgery

## 2019-04-12 DIAGNOSIS — Z20828 Contact with and (suspected) exposure to other viral communicable diseases: Secondary | ICD-10-CM | POA: Insufficient documentation

## 2019-04-12 DIAGNOSIS — Z01812 Encounter for preprocedural laboratory examination: Secondary | ICD-10-CM | POA: Diagnosis not present

## 2019-04-12 LAB — SARS CORONAVIRUS 2 (TAT 6-24 HRS): SARS Coronavirus 2: NEGATIVE

## 2019-04-15 ENCOUNTER — Inpatient Hospital Stay (HOSPITAL_COMMUNITY): Payer: BC Managed Care – PPO | Admitting: Certified Registered"

## 2019-04-15 ENCOUNTER — Inpatient Hospital Stay (HOSPITAL_COMMUNITY)
Admission: RE | Admit: 2019-04-15 | Discharge: 2019-06-11 | DRG: 614 | Disposition: A | Discharge status: Rehabilitation Facility | Payer: BC Managed Care – PPO | Attending: Neurological Surgery | Admitting: Neurological Surgery

## 2019-04-15 ENCOUNTER — Encounter (HOSPITAL_COMMUNITY)
Admission: RE | Disposition: A | Discharge status: Rehabilitation Facility | Payer: Self-pay | Source: Home / Self Care | Attending: Neurological Surgery

## 2019-04-15 ENCOUNTER — Inpatient Hospital Stay (HOSPITAL_COMMUNITY): Payer: BC Managed Care – PPO | Admitting: Physician Assistant

## 2019-04-15 ENCOUNTER — Other Ambulatory Visit: Payer: Self-pay

## 2019-04-15 ENCOUNTER — Encounter (HOSPITAL_COMMUNITY): Payer: Self-pay

## 2019-04-15 DIAGNOSIS — R400 Somnolence: Secondary | ICD-10-CM | POA: Diagnosis not present

## 2019-04-15 DIAGNOSIS — Z7984 Long term (current) use of oral hypoglycemic drugs: Secondary | ICD-10-CM

## 2019-04-15 DIAGNOSIS — Z978 Presence of other specified devices: Secondary | ICD-10-CM | POA: Diagnosis not present

## 2019-04-15 DIAGNOSIS — G9600 Cerebrospinal fluid leak, unspecified: Secondary | ICD-10-CM | POA: Diagnosis not present

## 2019-04-15 DIAGNOSIS — G0490 Encephalitis and encephalomyelitis, unspecified: Secondary | ICD-10-CM | POA: Diagnosis not present

## 2019-04-15 DIAGNOSIS — D62 Acute posthemorrhagic anemia: Secondary | ICD-10-CM | POA: Diagnosis not present

## 2019-04-15 DIAGNOSIS — N179 Acute kidney failure, unspecified: Secondary | ICD-10-CM | POA: Diagnosis not present

## 2019-04-15 DIAGNOSIS — H5462 Unqualified visual loss, left eye, normal vision right eye: Secondary | ICD-10-CM | POA: Diagnosis not present

## 2019-04-15 DIAGNOSIS — H269 Unspecified cataract: Secondary | ICD-10-CM | POA: Diagnosis present

## 2019-04-15 DIAGNOSIS — D721 Eosinophilia: Secondary | ICD-10-CM | POA: Diagnosis present

## 2019-04-15 DIAGNOSIS — H409 Unspecified glaucoma: Secondary | ICD-10-CM | POA: Diagnosis present

## 2019-04-15 DIAGNOSIS — Z803 Family history of malignant neoplasm of breast: Secondary | ICD-10-CM

## 2019-04-15 DIAGNOSIS — R0902 Hypoxemia: Secondary | ICD-10-CM | POA: Diagnosis not present

## 2019-04-15 DIAGNOSIS — I10 Essential (primary) hypertension: Secondary | ICD-10-CM | POA: Diagnosis present

## 2019-04-15 DIAGNOSIS — D352 Benign neoplasm of pituitary gland: Secondary | ICD-10-CM | POA: Diagnosis not present

## 2019-04-15 DIAGNOSIS — Z90710 Acquired absence of both cervix and uterus: Secondary | ICD-10-CM

## 2019-04-15 DIAGNOSIS — A390 Meningococcal meningitis: Secondary | ICD-10-CM | POA: Diagnosis not present

## 2019-04-15 DIAGNOSIS — Z982 Presence of cerebrospinal fluid drainage device: Secondary | ICD-10-CM | POA: Diagnosis not present

## 2019-04-15 DIAGNOSIS — E119 Type 2 diabetes mellitus without complications: Secondary | ICD-10-CM | POA: Diagnosis not present

## 2019-04-15 DIAGNOSIS — S06360A Traumatic hemorrhage of cerebrum, unspecified, without loss of consciousness, initial encounter: Secondary | ICD-10-CM | POA: Diagnosis not present

## 2019-04-15 DIAGNOSIS — Y838 Other surgical procedures as the cause of abnormal reaction of the patient, or of later complication, without mention of misadventure at the time of the procedure: Secondary | ICD-10-CM | POA: Diagnosis not present

## 2019-04-15 DIAGNOSIS — E1169 Type 2 diabetes mellitus with other specified complication: Secondary | ICD-10-CM

## 2019-04-15 DIAGNOSIS — N341 Nonspecific urethritis: Secondary | ICD-10-CM | POA: Diagnosis not present

## 2019-04-15 DIAGNOSIS — R7401 Elevation of levels of liver transaminase levels: Secondary | ICD-10-CM | POA: Diagnosis not present

## 2019-04-15 DIAGNOSIS — G9782 Other postprocedural complications and disorders of nervous system: Secondary | ICD-10-CM | POA: Diagnosis not present

## 2019-04-15 DIAGNOSIS — E669 Obesity, unspecified: Secondary | ICD-10-CM | POA: Diagnosis not present

## 2019-04-15 DIAGNOSIS — E7800 Pure hypercholesterolemia, unspecified: Secondary | ICD-10-CM | POA: Diagnosis present

## 2019-04-15 DIAGNOSIS — E1122 Type 2 diabetes mellitus with diabetic chronic kidney disease: Secondary | ICD-10-CM | POA: Diagnosis present

## 2019-04-15 DIAGNOSIS — Z23 Encounter for immunization: Secondary | ICD-10-CM

## 2019-04-15 DIAGNOSIS — I129 Hypertensive chronic kidney disease with stage 1 through stage 4 chronic kidney disease, or unspecified chronic kidney disease: Secondary | ICD-10-CM | POA: Diagnosis present

## 2019-04-15 DIAGNOSIS — R509 Fever, unspecified: Secondary | ICD-10-CM

## 2019-04-15 DIAGNOSIS — Z8249 Family history of ischemic heart disease and other diseases of the circulatory system: Secondary | ICD-10-CM

## 2019-04-15 DIAGNOSIS — D649 Anemia, unspecified: Secondary | ICD-10-CM | POA: Diagnosis present

## 2019-04-15 DIAGNOSIS — E871 Hypo-osmolality and hyponatremia: Secondary | ICD-10-CM | POA: Diagnosis present

## 2019-04-15 DIAGNOSIS — Z8661 Personal history of infections of the central nervous system: Secondary | ICD-10-CM | POA: Diagnosis not present

## 2019-04-15 DIAGNOSIS — E1165 Type 2 diabetes mellitus with hyperglycemia: Secondary | ICD-10-CM | POA: Diagnosis not present

## 2019-04-15 DIAGNOSIS — Z87891 Personal history of nicotine dependence: Secondary | ICD-10-CM

## 2019-04-15 DIAGNOSIS — T8131XA Disruption of external operation (surgical) wound, not elsewhere classified, initial encounter: Secondary | ICD-10-CM | POA: Diagnosis not present

## 2019-04-15 DIAGNOSIS — N182 Chronic kidney disease, stage 2 (mild): Secondary | ICD-10-CM | POA: Diagnosis not present

## 2019-04-15 DIAGNOSIS — Z853 Personal history of malignant neoplasm of breast: Secondary | ICD-10-CM

## 2019-04-15 DIAGNOSIS — G9601 Cranial cerebrospinal fluid leak, spontaneous: Secondary | ICD-10-CM | POA: Diagnosis not present

## 2019-04-15 DIAGNOSIS — E11649 Type 2 diabetes mellitus with hypoglycemia without coma: Secondary | ICD-10-CM | POA: Diagnosis not present

## 2019-04-15 DIAGNOSIS — R410 Disorientation, unspecified: Secondary | ICD-10-CM | POA: Diagnosis not present

## 2019-04-15 DIAGNOSIS — R339 Retention of urine, unspecified: Secondary | ICD-10-CM | POA: Diagnosis not present

## 2019-04-15 DIAGNOSIS — J3489 Other specified disorders of nose and nasal sinuses: Secondary | ICD-10-CM | POA: Diagnosis not present

## 2019-04-15 DIAGNOSIS — G910 Communicating hydrocephalus: Secondary | ICD-10-CM | POA: Diagnosis not present

## 2019-04-15 DIAGNOSIS — N183 Chronic kidney disease, stage 3 (moderate): Secondary | ICD-10-CM | POA: Diagnosis not present

## 2019-04-15 DIAGNOSIS — R0602 Shortness of breath: Secondary | ICD-10-CM | POA: Diagnosis not present

## 2019-04-15 DIAGNOSIS — R519 Headache, unspecified: Secondary | ICD-10-CM | POA: Diagnosis present

## 2019-04-15 DIAGNOSIS — H547 Unspecified visual loss: Secondary | ICD-10-CM | POA: Diagnosis present

## 2019-04-15 DIAGNOSIS — Z833 Family history of diabetes mellitus: Secondary | ICD-10-CM

## 2019-04-15 DIAGNOSIS — G009 Bacterial meningitis, unspecified: Secondary | ICD-10-CM | POA: Diagnosis present

## 2019-04-15 DIAGNOSIS — R5381 Other malaise: Secondary | ICD-10-CM | POA: Diagnosis not present

## 2019-04-15 DIAGNOSIS — G9389 Other specified disorders of brain: Secondary | ICD-10-CM | POA: Diagnosis not present

## 2019-04-15 DIAGNOSIS — N189 Chronic kidney disease, unspecified: Secondary | ICD-10-CM | POA: Diagnosis present

## 2019-04-15 DIAGNOSIS — A689 Relapsing fever, unspecified: Secondary | ICD-10-CM | POA: Diagnosis present

## 2019-04-15 DIAGNOSIS — D7219 Other eosinophilia: Secondary | ICD-10-CM | POA: Diagnosis present

## 2019-04-15 DIAGNOSIS — Z9221 Personal history of antineoplastic chemotherapy: Secondary | ICD-10-CM

## 2019-04-15 DIAGNOSIS — Z4541 Encounter for adjustment and management of cerebrospinal fluid drainage device: Secondary | ICD-10-CM | POA: Diagnosis not present

## 2019-04-15 DIAGNOSIS — Z419 Encounter for procedure for purposes other than remedying health state, unspecified: Secondary | ICD-10-CM

## 2019-04-15 DIAGNOSIS — Z79899 Other long term (current) drug therapy: Secondary | ICD-10-CM

## 2019-04-15 DIAGNOSIS — R29818 Other symptoms and signs involving the nervous system: Secondary | ICD-10-CM | POA: Diagnosis not present

## 2019-04-15 DIAGNOSIS — R5082 Postprocedural fever: Secondary | ICD-10-CM | POA: Diagnosis not present

## 2019-04-15 DIAGNOSIS — D638 Anemia in other chronic diseases classified elsewhere: Secondary | ICD-10-CM | POA: Diagnosis not present

## 2019-04-15 DIAGNOSIS — Z923 Personal history of irradiation: Secondary | ICD-10-CM

## 2019-04-15 DIAGNOSIS — Z6838 Body mass index (BMI) 38.0-38.9, adult: Secondary | ICD-10-CM

## 2019-04-15 HISTORY — PX: TRANSNASAL APPROACH: SHX6149

## 2019-04-15 HISTORY — PX: TRANSPHENOIDAL APPROACH EXPOSURE: SHX6311

## 2019-04-15 HISTORY — PX: CRANIOTOMY: SHX93

## 2019-04-15 LAB — CBC
HCT: 29.7 % — ABNORMAL LOW (ref 36.0–46.0)
Hemoglobin: 9.2 g/dL — ABNORMAL LOW (ref 12.0–15.0)
MCH: 27 pg (ref 26.0–34.0)
MCHC: 31 g/dL (ref 30.0–36.0)
MCV: 87.1 fL (ref 80.0–100.0)
Platelets: 229 10*3/uL (ref 150–400)
RBC: 3.41 MIL/uL — ABNORMAL LOW (ref 3.87–5.11)
RDW: 15.1 % (ref 11.5–15.5)
WBC: 8.7 10*3/uL (ref 4.0–10.5)
nRBC: 0 % (ref 0.0–0.2)

## 2019-04-15 LAB — GLUCOSE, CAPILLARY
Glucose-Capillary: 174 mg/dL — ABNORMAL HIGH (ref 70–99)
Glucose-Capillary: 199 mg/dL — ABNORMAL HIGH (ref 70–99)
Glucose-Capillary: 255 mg/dL — ABNORMAL HIGH (ref 70–99)
Glucose-Capillary: 281 mg/dL — ABNORMAL HIGH (ref 70–99)
Glucose-Capillary: 264 mg/dL — ABNORMAL HIGH (ref 70–99)

## 2019-04-15 LAB — URINALYSIS, ROUTINE W REFLEX MICROSCOPIC
Bacteria, UA: NONE SEEN
Bilirubin Urine: NEGATIVE
Glucose, UA: >=500 mg/dL — AB
Hgb urine dipstick: NEGATIVE
Ketones, ur: NEGATIVE mg/dL
Leukocytes,Ua: NEGATIVE
Nitrite: NEGATIVE
Protein, ur: NEGATIVE mg/dL
Specific Gravity, Urine: 1.006 (ref 1.005–1.030)
pH: 5 (ref 5.0–8.0)

## 2019-04-15 LAB — CREATININE, SERUM
Creatinine, Ser: 1.45 mg/dL — ABNORMAL HIGH (ref 0.44–1.00)
GFR calc Af Amer: 45 mL/min — ABNORMAL LOW (ref >60)
GFR calc non Af Amer: 39 mL/min — ABNORMAL LOW (ref >60)

## 2019-04-15 LAB — RENAL FUNCTION PANEL
Albumin: 3.5 g/dL (ref 3.5–5.0)
Anion gap: 12 (ref 5–15)
BUN: 33 mg/dL — ABNORMAL HIGH (ref 8–23)
CO2: 19 mmol/L — ABNORMAL LOW (ref 22–32)
Calcium: 9.2 mg/dL (ref 8.9–10.3)
Chloride: 105 mmol/L (ref 98–111)
Creatinine, Ser: 1.49 mg/dL — ABNORMAL HIGH (ref 0.44–1.00)
GFR calc Af Amer: 43 mL/min — ABNORMAL LOW (ref >60)
GFR calc non Af Amer: 38 mL/min — ABNORMAL LOW (ref >60)
Glucose, Bld: 300 mg/dL — ABNORMAL HIGH (ref 70–99)
Phosphorus: 3.7 mg/dL (ref 2.5–4.6)
Potassium: 4.5 mmol/L (ref 3.5–5.1)
Sodium: 136 mmol/L (ref 135–145)

## 2019-04-15 LAB — PREPARE RBC (CROSSMATCH)

## 2019-04-15 LAB — OSMOLALITY, URINE: Osmolality, Ur: 273 mOsm/kg — ABNORMAL LOW (ref 300–900)

## 2019-04-15 SURGERY — CRANIOTOMY HYPOPHYSECTOMY TRANSNASAL APPROACH
Anesthesia: General | Site: Nose

## 2019-04-15 MED ORDER — ACETAMINOPHEN 650 MG RE SUPP: 650.0000 mg | RECTAL | Status: DC | PRN

## 2019-04-15 MED ORDER — PROMETHAZINE HCL 25 MG PO TABS: 12.5000 mg | ORAL_TABLET | ORAL | Status: DC | PRN

## 2019-04-15 MED ORDER — LIDOCAINE-EPINEPHRINE 1 %-1:100000 IJ SOLN
INTRAMUSCULAR | Status: AC
  Filled 2019-04-15: qty 2

## 2019-04-15 MED ORDER — ROCURONIUM BROMIDE 50 MG/5ML IV SOSY
PREFILLED_SYRINGE | INTRAVENOUS | Status: DC | PRN
  Administered 2019-04-15: 50 mg via INTRAVENOUS
  Administered 2019-04-15: 10 mg via INTRAVENOUS
  Administered 2019-04-15: 40 mg via INTRAVENOUS

## 2019-04-15 MED ORDER — HYDROCODONE-ACETAMINOPHEN 5-325 MG PO TABS
1.0000 | ORAL_TABLET | ORAL | Status: DC | PRN
  Administered 2019-04-15 – 2019-06-01 (×13): 1 via ORAL
  Filled 2019-04-15 (×14): qty 1

## 2019-04-15 MED ORDER — PROPOFOL 10 MG/ML IV BOLUS
INTRAVENOUS | Status: AC
  Filled 2019-04-15: qty 40

## 2019-04-15 MED ORDER — DEXAMETHASONE SODIUM PHOSPHATE 10 MG/ML IJ SOLN
INTRAMUSCULAR | Status: DC | PRN
  Administered 2019-04-15: 4 mg via INTRAVENOUS

## 2019-04-15 MED ORDER — FENTANYL CITRATE (PF) 250 MCG/5ML IJ SOLN
INTRAMUSCULAR | Status: DC | PRN
  Administered 2019-04-15 (×3): 50 ug via INTRAVENOUS
  Administered 2019-04-15: 100 ug via INTRAVENOUS

## 2019-04-15 MED ORDER — LIDOCAINE-EPINEPHRINE 1 %-1:100000 IJ SOLN
INTRAMUSCULAR | Status: DC | PRN
  Administered 2019-04-15: 8 mL
  Administered 2019-04-15: 6 mL

## 2019-04-15 MED ORDER — LABETALOL HCL 5 MG/ML IV SOLN
10.0000 mg | INTRAVENOUS | Status: DC | PRN
  Administered 2019-04-15 (×2): 10 mg via INTRAVENOUS
  Administered 2019-05-16 – 2019-05-20 (×2): 20 mg via INTRAVENOUS
  Filled 2019-04-15 (×3): qty 4

## 2019-04-15 MED ORDER — ACETAMINOPHEN 500 MG PO TABS
ORAL_TABLET | ORAL | Status: AC
  Administered 2019-04-15: 1000 mg via ORAL
  Filled 2019-04-15: qty 2

## 2019-04-15 MED ORDER — AMLODIPINE BESYLATE 10 MG PO TABS
10.0000 mg | ORAL_TABLET | Freq: Every day | ORAL | Status: DC
  Administered 2019-04-16 – 2019-06-11 (×49): 10 mg via ORAL
  Filled 2019-04-15 (×51): qty 1

## 2019-04-15 MED ORDER — SODIUM CHLORIDE 0.9 % IV SOLN
INTRAVENOUS | Status: DC | PRN
  Administered 2019-04-15: 08:00:00 via INTRAVENOUS

## 2019-04-15 MED ORDER — TIZANIDINE HCL 4 MG PO TABS
4.0000 mg | ORAL_TABLET | Freq: Three times a day (TID) | ORAL | Status: DC | PRN
  Administered 2019-04-20: 4 mg via ORAL
  Filled 2019-04-15 (×3): qty 1

## 2019-04-15 MED ORDER — CEPHALEXIN 500 MG PO CAPS: 500.0000 mg | ORAL_CAPSULE | Freq: Three times a day (TID) | ORAL | 0 refills | Status: AC

## 2019-04-15 MED ORDER — LACTATED RINGERS IV SOLN: INTRAVENOUS | Status: DC | PRN

## 2019-04-15 MED ORDER — FENTANYL CITRATE (PF) 250 MCG/5ML IJ SOLN
INTRAMUSCULAR | Status: AC
  Filled 2019-04-15: qty 5

## 2019-04-15 MED ORDER — OXYCODONE HCL 5 MG/5ML PO SOLN: 5.0000 mg | Freq: Once | ORAL | Status: DC | PRN

## 2019-04-15 MED ORDER — GABAPENTIN 300 MG PO CAPS
300.0000 mg | ORAL_CAPSULE | Freq: Three times a day (TID) | ORAL | Status: DC
  Administered 2019-04-15 – 2019-05-06 (×55): 300 mg via ORAL
  Filled 2019-04-15 (×12): qty 1
  Filled 2019-04-15: qty 3
  Filled 2019-04-15 (×42): qty 1

## 2019-04-15 MED ORDER — ATORVASTATIN CALCIUM 40 MG PO TABS
40.0000 mg | ORAL_TABLET | Freq: Every day | ORAL | Status: DC
  Administered 2019-04-16 – 2019-06-10 (×52): 40 mg via ORAL
  Filled 2019-04-15 (×53): qty 1

## 2019-04-15 MED ORDER — METFORMIN HCL ER 500 MG PO TB24
1000.0000 mg | ORAL_TABLET | Freq: Every day | ORAL | Status: DC
  Filled 2019-04-15: qty 2

## 2019-04-15 MED ORDER — MIDAZOLAM HCL 2 MG/2ML IJ SOLN
INTRAMUSCULAR | Status: DC | PRN
  Administered 2019-04-15: 2 mg via INTRAVENOUS

## 2019-04-15 MED ORDER — SUGAMMADEX SODIUM 200 MG/2ML IV SOLN
INTRAVENOUS | Status: DC | PRN
  Administered 2019-04-15: 212.6 mg via INTRAVENOUS

## 2019-04-15 MED ORDER — ACETAMINOPHEN 500 MG PO TABS
1000.0000 mg | ORAL_TABLET | Freq: Once | ORAL | Status: AC
  Administered 2019-04-15: 07:00:00 1000 mg via ORAL

## 2019-04-15 MED ORDER — PROPOFOL 10 MG/ML IV BOLUS
INTRAVENOUS | Status: DC | PRN
  Administered 2019-04-15 (×3): 50 mg via INTRAVENOUS

## 2019-04-15 MED ORDER — HEPARIN SODIUM (PORCINE) 5000 UNIT/ML IJ SOLN
5000.0000 [IU] | Freq: Three times a day (TID) | INTRAMUSCULAR | Status: DC
  Administered 2019-04-17 – 2019-04-23 (×17): 5000 [IU] via SUBCUTANEOUS
  Filled 2019-04-15 (×17): qty 1

## 2019-04-15 MED ORDER — DOCUSATE SODIUM 100 MG PO CAPS
100.0000 mg | ORAL_CAPSULE | Freq: Two times a day (BID) | ORAL | Status: DC
  Administered 2019-04-15 – 2019-06-11 (×91): 100 mg via ORAL
  Filled 2019-04-15 (×94): qty 1

## 2019-04-15 MED ORDER — ONDANSETRON HCL 4 MG/2ML IJ SOLN
INTRAMUSCULAR | Status: AC
  Filled 2019-04-15: qty 2

## 2019-04-15 MED ORDER — THROMBIN 5000 UNITS EX SOLR
CUTANEOUS | Status: AC
  Filled 2019-04-15: qty 5000

## 2019-04-15 MED ORDER — MIDAZOLAM HCL 2 MG/2ML IJ SOLN
INTRAMUSCULAR | Status: AC
  Filled 2019-04-15: qty 2

## 2019-04-15 MED ORDER — ONDANSETRON HCL 4 MG/2ML IJ SOLN
4.0000 mg | INTRAMUSCULAR | Status: DC | PRN
  Administered 2019-05-04 – 2019-05-16 (×3): 4 mg via INTRAVENOUS
  Filled 2019-04-15 (×5): qty 2

## 2019-04-15 MED ORDER — LIDOCAINE 2% (20 MG/ML) 5 ML SYRINGE
INTRAMUSCULAR | Status: DC | PRN
  Administered 2019-04-15: 60 mg via INTRAVENOUS

## 2019-04-15 MED ORDER — HYDROMORPHONE HCL 1 MG/ML IJ SOLN
INTRAMUSCULAR | Status: AC
  Administered 2019-04-15: 12:00:00 0.25 mg via INTRAVENOUS
  Filled 2019-04-15: qty 1

## 2019-04-15 MED ORDER — SODIUM CHLORIDE 0.9 % IV SOLN
INTRAVENOUS | Status: DC | PRN
  Administered 2019-04-15: 25 ug/min via INTRAVENOUS

## 2019-04-15 MED ORDER — OXYCODONE HCL 5 MG PO TABS: 5.0000 mg | ORAL_TABLET | Freq: Once | ORAL | Status: DC | PRN

## 2019-04-15 MED ORDER — POLYETHYLENE GLYCOL 3350 17 G PO PACK
17.0000 g | PACK | Freq: Every day | ORAL | Status: DC | PRN
  Administered 2019-06-05: 17 g via ORAL
  Filled 2019-04-15: qty 1

## 2019-04-15 MED ORDER — OXYMETAZOLINE HCL 0.05 % NA SOLN
NASAL | Status: AC
  Filled 2019-04-15: qty 30

## 2019-04-15 MED ORDER — SODIUM CHLORIDE 0.9 % IV SOLN
INTRAVENOUS | Status: DC | PRN
  Administered 2019-04-15: 500 mL

## 2019-04-15 MED ORDER — LISINOPRIL-HYDROCHLOROTHIAZIDE 20-12.5 MG PO TABS: 2.0000 | ORAL_TABLET | Freq: Every day | ORAL | Status: DC

## 2019-04-15 MED ORDER — MUPIROCIN CALCIUM 2 % EX CREA
TOPICAL_CREAM | CUTANEOUS | Status: AC
  Filled 2019-04-15: qty 15

## 2019-04-15 MED ORDER — CEFAZOLIN SODIUM-DEXTROSE 2-4 GM/100ML-% IV SOLN
INTRAVENOUS | Status: AC
  Filled 2019-04-15: qty 100

## 2019-04-15 MED ORDER — SODIUM CHLORIDE 0.9 % IR SOLN
Status: DC | PRN
  Administered 2019-04-15: 1000 mL

## 2019-04-15 MED ORDER — HEMOSTATIC AGENTS (NO CHARGE) OPTIME
TOPICAL | Status: DC | PRN
  Administered 2019-04-15: 1 via TOPICAL

## 2019-04-15 MED ORDER — CHLORHEXIDINE GLUCONATE CLOTH 2 % EX PADS: 6.0000 | MEDICATED_PAD | Freq: Once | CUTANEOUS | Status: DC

## 2019-04-15 MED ORDER — CEFAZOLIN SODIUM-DEXTROSE 2-4 GM/100ML-% IV SOLN
2000.0000 mg | Freq: Three times a day (TID) | INTRAVENOUS | Status: AC
  Administered 2019-04-15 – 2019-04-16 (×3): 2000 mg via INTRAVENOUS
  Filled 2019-04-15 (×3): qty 100

## 2019-04-15 MED ORDER — 0.9 % SODIUM CHLORIDE (POUR BTL) OPTIME
TOPICAL | Status: DC | PRN
  Administered 2019-04-15 (×2): 1000 mL

## 2019-04-15 MED ORDER — ACETAMINOPHEN 325 MG PO TABS
650.0000 mg | ORAL_TABLET | ORAL | Status: DC | PRN
  Administered 2019-04-16 – 2019-04-23 (×11): 650 mg via ORAL
  Filled 2019-04-15 (×12): qty 2

## 2019-04-15 MED ORDER — INSULIN ASPART 100 UNIT/ML ~~LOC~~ SOLN
0.0000 [IU] | Freq: Three times a day (TID) | SUBCUTANEOUS | Status: DC
  Administered 2019-04-15: 8 [IU] via SUBCUTANEOUS
  Administered 2019-04-16 (×2): 5 [IU] via SUBCUTANEOUS
  Administered 2019-04-16 – 2019-04-17 (×2): 3 [IU] via SUBCUTANEOUS
  Administered 2019-04-17: 2 [IU] via SUBCUTANEOUS
  Administered 2019-04-17: 3 [IU] via SUBCUTANEOUS
  Administered 2019-04-18: 12:00:00 2 [IU] via SUBCUTANEOUS
  Administered 2019-04-18: 3 [IU] via SUBCUTANEOUS
  Administered 2019-04-19: 2 [IU] via SUBCUTANEOUS
  Administered 2019-04-19 – 2019-04-21 (×4): 3 [IU] via SUBCUTANEOUS
  Administered 2019-04-21 – 2019-04-22 (×2): 2 [IU] via SUBCUTANEOUS
  Administered 2019-04-23 (×2): 3 [IU] via SUBCUTANEOUS
  Administered 2019-04-24: 11 [IU] via SUBCUTANEOUS
  Administered 2019-04-24: 3 [IU] via SUBCUTANEOUS
  Administered 2019-04-25: 11 [IU] via SUBCUTANEOUS
  Administered 2019-04-25: 8 [IU] via SUBCUTANEOUS

## 2019-04-15 MED ORDER — METFORMIN HCL 500 MG PO TABS
500.0000 mg | ORAL_TABLET | Freq: Two times a day (BID) | ORAL | Status: DC
  Administered 2019-04-16 – 2019-05-06 (×35): 500 mg via ORAL
  Filled 2019-04-15 (×36): qty 1

## 2019-04-15 MED ORDER — ROCURONIUM BROMIDE 10 MG/ML (PF) SYRINGE
PREFILLED_SYRINGE | INTRAVENOUS | Status: AC
  Filled 2019-04-15: qty 10

## 2019-04-15 MED ORDER — POLYSACCHARIDE IRON COMPLEX 150 MG PO CAPS
150.0000 mg | ORAL_CAPSULE | Freq: Two times a day (BID) | ORAL | Status: DC
  Administered 2019-04-15 – 2019-06-11 (×104): 150 mg via ORAL
  Filled 2019-04-15 (×124): qty 1

## 2019-04-15 MED ORDER — HYDROCHLOROTHIAZIDE 25 MG PO TABS
25.0000 mg | ORAL_TABLET | Freq: Every day | ORAL | Status: DC
  Administered 2019-04-15 – 2019-05-05 (×17): 25 mg via ORAL
  Filled 2019-04-15 (×17): qty 1

## 2019-04-15 MED ORDER — SODIUM CHLORIDE 0.9 % IV SOLN
INTRAVENOUS | Status: DC | PRN
  Administered 2019-04-15: 07:00:00 via INTRAVENOUS

## 2019-04-15 MED ORDER — ONDANSETRON HCL 4 MG PO TABS
4.0000 mg | ORAL_TABLET | ORAL | Status: DC | PRN
  Administered 2019-04-30: 4 mg via ORAL

## 2019-04-15 MED ORDER — CEPHALEXIN 500 MG PO CAPS
500.0000 mg | ORAL_CAPSULE | Freq: Three times a day (TID) | ORAL | Status: DC
  Administered 2019-04-16 – 2019-04-30 (×38): 500 mg via ORAL
  Filled 2019-04-15 (×49): qty 1

## 2019-04-15 MED ORDER — LISINOPRIL 20 MG PO TABS
40.0000 mg | ORAL_TABLET | Freq: Every day | ORAL | Status: DC
  Administered 2019-04-15 – 2019-04-23 (×8): 40 mg via ORAL
  Filled 2019-04-15 (×9): qty 2

## 2019-04-15 MED ORDER — HYDROMORPHONE HCL 1 MG/ML IJ SOLN
0.5000 mg | INTRAMUSCULAR | Status: DC | PRN
  Administered 2019-04-26: 0.5 mg via INTRAVENOUS
  Filled 2019-04-15: qty 1

## 2019-04-15 MED ORDER — SALINE SPRAY 0.65 % NA SOLN
4.0000 | NASAL | Status: DC | PRN
  Filled 2019-04-15: qty 44

## 2019-04-15 MED ORDER — ALBUMIN HUMAN 5 % IV SOLN
INTRAVENOUS | Status: DC | PRN
  Administered 2019-04-15: 09:00:00 via INTRAVENOUS

## 2019-04-15 MED ORDER — CEFAZOLIN SODIUM-DEXTROSE 2-4 GM/100ML-% IV SOLN: 2.0000 g | INTRAVENOUS | Status: DC

## 2019-04-15 MED ORDER — ESMOLOL HCL 100 MG/10ML IV SOLN
INTRAVENOUS | Status: AC
  Filled 2019-04-15: qty 10

## 2019-04-15 MED ORDER — DEXAMETHASONE SODIUM PHOSPHATE 10 MG/ML IJ SOLN
INTRAMUSCULAR | Status: AC
  Filled 2019-04-15: qty 1

## 2019-04-15 MED ORDER — LABETALOL HCL 5 MG/ML IV SOLN
INTRAVENOUS | Status: AC
  Administered 2019-04-15: 11:00:00 10 mg via INTRAVENOUS
  Filled 2019-04-15: qty 4

## 2019-04-15 MED ORDER — GLYBURIDE 2.5 MG PO TABS
5.0000 mg | ORAL_TABLET | Freq: Two times a day (BID) | ORAL | Status: DC
  Administered 2019-04-15 – 2019-04-21 (×11): 5 mg via ORAL
  Filled 2019-04-15 (×6): qty 1
  Filled 2019-04-15 (×2): qty 2
  Filled 2019-04-15 (×3): qty 1
  Filled 2019-04-15: qty 2
  Filled 2019-04-15 (×6): qty 1

## 2019-04-15 MED ORDER — HYDROMORPHONE HCL 1 MG/ML IJ SOLN
0.2500 mg | INTRAMUSCULAR | Status: DC | PRN
  Administered 2019-04-15 (×3): 0.25 mg via INTRAVENOUS

## 2019-04-15 MED ORDER — THROMBIN 5000 UNITS EX SOLR
OROMUCOSAL | Status: DC | PRN
  Administered 2019-04-15: 09:00:00 5 mL via TOPICAL

## 2019-04-15 MED ORDER — SODIUM CHLORIDE 0.9 % IV SOLN
INTRAVENOUS | Status: DC | PRN
  Administered 2019-04-15: 1000 mL via INTRAVENOUS

## 2019-04-15 MED ORDER — CEFAZOLIN SODIUM-DEXTROSE 2-3 GM-%(50ML) IV SOLR
INTRAVENOUS | Status: DC | PRN
  Administered 2019-04-15: 2 g via INTRAVENOUS

## 2019-04-15 MED ORDER — CEFAZOLIN SODIUM-DEXTROSE 2-4 GM/100ML-% IV SOLN: 2.0000 g | Freq: Three times a day (TID) | INTRAVENOUS | Status: DC

## 2019-04-15 MED ORDER — ONDANSETRON HCL 4 MG/2ML IJ SOLN
INTRAMUSCULAR | Status: DC | PRN
  Administered 2019-04-15: 4 mg via INTRAVENOUS

## 2019-04-15 MED ORDER — OXYMETAZOLINE HCL 0.05 % NA SOLN
NASAL | Status: DC | PRN
  Administered 2019-04-15 (×2): 1

## 2019-04-15 MED ORDER — LIDOCAINE 2% (20 MG/ML) 5 ML SYRINGE
INTRAMUSCULAR | Status: AC
  Filled 2019-04-15: qty 5

## 2019-04-15 MED ORDER — PROMETHAZINE HCL 25 MG/ML IJ SOLN: 6.2500 mg | INTRAMUSCULAR | Status: DC | PRN

## 2019-04-15 SURGICAL SUPPLY — 126 items
ATTRACTOMAT 16X20 MAGNETIC DRP (DRAPES) ×1 IMPLANT
BAG DECANTER FOR FLEXI CONT (MISCELLANEOUS) ×2 IMPLANT
BENZOIN TINCTURE PRP APPL 2/3 (GAUZE/BANDAGES/DRESSINGS) ×1 IMPLANT
BLADE RAD40 ROTATE 4M 4 5PK (BLADE) IMPLANT
BLADE ROTATE RAD 40 4 M4 (BLADE) IMPLANT
BLADE ROTATE TRICUT 4X13 M4 (BLADE) ×2 IMPLANT
BLADE SURG 10 STRL SS (BLADE) ×1 IMPLANT
BLADE SURG 11 STRL SS (BLADE) ×2 IMPLANT
BLADE SURG 15 STRL LF DISP TIS (BLADE) ×2 IMPLANT
BLADE SURG 15 STRL SS (BLADE)
BUR DIAMOND 13X5 70D (BURR) IMPLANT
BUR DIAMOND CURV 15X5 15D (BURR) ×1 IMPLANT
BUR TAPER CHOANAL ATRESIA 30K (BURR) ×1 IMPLANT
CABLE BIPOLOR RESECTION CORD (MISCELLANEOUS) ×2 IMPLANT
CANISTER SUCT 3000ML PPV (MISCELLANEOUS) ×5 IMPLANT
COAGULATOR SUCT 8FR VV (MISCELLANEOUS) ×1 IMPLANT
CONT SPEC 4OZ CLIKSEAL STRL BL (MISCELLANEOUS) ×1 IMPLANT
COVER BACK TABLE 60X90IN (DRAPES) IMPLANT
COVER MAYO STAND STRL (DRAPES) ×2 IMPLANT
COVER WAND RF STERILE (DRAPES) ×3 IMPLANT
DERMABOND ADVANCED (GAUZE/BANDAGES/DRESSINGS) ×1
DERMABOND ADVANCED .7 DNX12 (GAUZE/BANDAGES/DRESSINGS) IMPLANT
DRAPE C-ARM 42X72 X-RAY (DRAPES) ×2 IMPLANT
DRAPE HALF SHEET 40X57 (DRAPES) ×4 IMPLANT
DRAPE INCISE IOBAN 66X45 STRL (DRAPES) ×2 IMPLANT
DRAPE INCISE IOBAN 85X60 (DRAPES) ×1 IMPLANT
DRAPE MICROSCOPE LEICA (MISCELLANEOUS) IMPLANT
DRAPE POUCH INSTRU U-SHP 10X18 (DRAPES) ×2 IMPLANT
DRAPE SURG 17X23 STRL (DRAPES) ×3 IMPLANT
DRESSING NASAL POPE 10X1.5X2.5 (GAUZE/BANDAGES/DRESSINGS) IMPLANT
DRSG NASAL POPE 10X1.5X2.5 (GAUZE/BANDAGES/DRESSINGS)
DRSG NASOPORE 8CM (GAUZE/BANDAGES/DRESSINGS) ×1 IMPLANT
DURAPREP 26ML APPLICATOR (WOUND CARE) ×2 IMPLANT
ELECT COATED BLADE 2.86 ST (ELECTRODE) IMPLANT
ELECT NDL TIP 2.8 STRL (NEEDLE) ×1 IMPLANT
ELECT NEEDLE TIP 2.8 STRL (NEEDLE) ×2 IMPLANT
ELECT REM PT RETURN 9FT ADLT (ELECTROSURGICAL) ×2
ELECTRODE REM PT RTRN 9FT ADLT (ELECTROSURGICAL) ×2 IMPLANT
FILTER ARTHROSCOPY CONVERTOR (FILTER) IMPLANT
GAUZE PACKING FOLDED 2  STR (GAUZE/BANDAGES/DRESSINGS)
GAUZE PACKING FOLDED 2 STR (GAUZE/BANDAGES/DRESSINGS) ×1 IMPLANT
GAUZE SPONGE 2X2 8PLY STRL LF (GAUZE/BANDAGES/DRESSINGS) ×1 IMPLANT
GAUZE SPONGE 4X4 12PLY STRL (GAUZE/BANDAGES/DRESSINGS) ×1 IMPLANT
GLOVE BIO SURGEON STRL SZ7.5 (GLOVE) ×3 IMPLANT
GLOVE BIOGEL M 7.0 STRL (GLOVE) ×4 IMPLANT
GLOVE BIOGEL PI IND STRL 6.5 (GLOVE) IMPLANT
GLOVE BIOGEL PI IND STRL 7.5 (GLOVE) ×1 IMPLANT
GLOVE BIOGEL PI INDICATOR 6.5 (GLOVE) ×3
GLOVE BIOGEL PI INDICATOR 7.5 (GLOVE) ×2
GLOVE EXAM NITRILE LRG STRL (GLOVE) IMPLANT
GLOVE EXAM NITRILE XL STR (GLOVE) IMPLANT
GLOVE EXAM NITRILE XS STR PU (GLOVE) IMPLANT
GLOVE SURG SS PI 6.0 STRL IVOR (GLOVE) ×5 IMPLANT
GOWN STRL REUS W/ TWL LRG LVL3 (GOWN DISPOSABLE) ×2 IMPLANT
GOWN STRL REUS W/ TWL XL LVL3 (GOWN DISPOSABLE) IMPLANT
GOWN STRL REUS W/TWL 2XL LVL3 (GOWN DISPOSABLE) ×1 IMPLANT
GOWN STRL REUS W/TWL LRG LVL3 (GOWN DISPOSABLE) ×4
GOWN STRL REUS W/TWL XL LVL3 (GOWN DISPOSABLE)
GRAFT DURAGEN MATRIX 1WX1L (Tissue) ×1 IMPLANT
HEMOSTAT POWDER KIT SURGIFOAM (HEMOSTASIS) ×2 IMPLANT
HEMOSTAT SURGICEL 2X14 (HEMOSTASIS) IMPLANT
KIT BASIN OR (CUSTOM PROCEDURE TRAY) ×4 IMPLANT
KIT DRAIN CSF ACCUDRAIN (MISCELLANEOUS) IMPLANT
KIT SUCTION CATH 14FR (SUCTIONS) ×1 IMPLANT
KIT TURNOVER KIT B (KITS) ×3 IMPLANT
KNIFE ARACHNOID DISP AM-21-S (BLADE) IMPLANT
NDL 18GX1X1/2 (RX/OR ONLY) (NEEDLE) IMPLANT
NDL HYPO 21X1.5 SAFETY (NEEDLE) IMPLANT
NDL HYPO 25GX1X1/2 BEV (NEEDLE) ×1 IMPLANT
NDL HYPO 25X1 1.5 SAFETY (NEEDLE) ×1 IMPLANT
NDL SPNL 22GX3.5 QUINCKE BK (NEEDLE) ×1 IMPLANT
NDL SPNL 25GX3.5 QUINCKE BL (NEEDLE) ×1 IMPLANT
NEEDLE 18GX1X1/2 (RX/OR ONLY) (NEEDLE) IMPLANT
NEEDLE HYPO 21X1.5 SAFETY (NEEDLE) ×2 IMPLANT
NEEDLE HYPO 25GX1X1/2 BEV (NEEDLE) ×2 IMPLANT
NEEDLE HYPO 25X1 1.5 SAFETY (NEEDLE) IMPLANT
NEEDLE SPNL 22GX3.5 QUINCKE BK (NEEDLE) IMPLANT
NEEDLE SPNL 25GX3.5 QUINCKE BL (NEEDLE) ×2 IMPLANT
NS IRRIG 1000ML POUR BTL (IV SOLUTION) ×4 IMPLANT
PAD ARMBOARD 7.5X6 YLW CONV (MISCELLANEOUS) ×5 IMPLANT
PATTIES SURGICAL .25X.25 (GAUZE/BANDAGES/DRESSINGS) IMPLANT
PATTIES SURGICAL .5 X.5 (GAUZE/BANDAGES/DRESSINGS) ×1 IMPLANT
PATTIES SURGICAL .5 X3 (DISPOSABLE) ×1 IMPLANT
PATTIES SURGICAL 1X1 (DISPOSABLE) ×1 IMPLANT
PENCIL BUTTON HOLSTER BLD 10FT (ELECTRODE) ×2 IMPLANT
PROBE FOR NEUROSURGERY (MISCELLANEOUS) IMPLANT
RUBBERBAND STERILE (MISCELLANEOUS) ×2 IMPLANT
SEALANT ADHERUS EXTEND TIP (MISCELLANEOUS) ×1 IMPLANT
SHEATH ENDOSCRUB 0 DEG (SHEATH) ×2 IMPLANT
SHEATH ENDOSCRUB 30 DEG (SHEATH) IMPLANT
SHEATH ENDOSCRUB 45 DEG (SHEATH) IMPLANT
SOLUTION BUTLER CLEAR DIP (MISCELLANEOUS) ×2 IMPLANT
SPECIMEN JAR SMALL (MISCELLANEOUS) IMPLANT
SPLINT NASAL DOYLE BI-VL (GAUZE/BANDAGES/DRESSINGS) ×1 IMPLANT
SPONGE GAUZE 2X2 STER 10/PKG (GAUZE/BANDAGES/DRESSINGS)
SPONGE LAP 4X18 RFD (DISPOSABLE) ×1 IMPLANT
SPONGE NEURO XRAY DETECT 1X3 (DISPOSABLE) ×2 IMPLANT
SPONGE SURGIFOAM ABS GEL SZ50 (HEMOSTASIS) IMPLANT
STAPLER SKIN PROX WIDE 3.9 (STAPLE) ×2 IMPLANT
STRIP CLOSURE SKIN 1/2X4 (GAUZE/BANDAGES/DRESSINGS) ×1 IMPLANT
SUT 5.0 PDS RB-1 (SUTURE)
SUT BONE WAX W31G (SUTURE) ×1 IMPLANT
SUT ETHILON 3 0 FSL (SUTURE) IMPLANT
SUT ETHILON 3 0 PS 1 (SUTURE) ×1 IMPLANT
SUT ETHILON 6 0 P 1 (SUTURE) ×1 IMPLANT
SUT MNCRL AB 4-0 PS2 18 (SUTURE) ×1 IMPLANT
SUT PDS AB 4-0 RB1 27 (SUTURE) ×1 IMPLANT
SUT PDS PLUS AB 5-0 RB-1 (SUTURE) ×1 IMPLANT
SUT PLAIN 4 0 ~~LOC~~ 1 (SUTURE) IMPLANT
SUT VIC AB 2-0 CP2 18 (SUTURE) ×1 IMPLANT
SUT VIC AB 4-0 P-3 18X BRD (SUTURE) ×1 IMPLANT
SUT VIC AB 4-0 P3 18 (SUTURE)
SYR BULB 3OZ (MISCELLANEOUS) ×1 IMPLANT
SYR CONTROL 10ML LL (SYRINGE) ×1 IMPLANT
TOWEL GREEN STERILE (TOWEL DISPOSABLE) ×3 IMPLANT
TOWEL GREEN STERILE FF (TOWEL DISPOSABLE) ×4 IMPLANT
TRACKER ENT INSTRUMENT (MISCELLANEOUS) ×2 IMPLANT
TRACKER ENT PATIENT (MISCELLANEOUS) ×2 IMPLANT
TRAP SPECIMEN MUCOUS 40CC (MISCELLANEOUS) IMPLANT
TRAY ENT MC OR (CUSTOM PROCEDURE TRAY) ×4 IMPLANT
TRAY FOLEY MTR SLVR 16FR STAT (SET/KITS/TRAYS/PACK) ×2 IMPLANT
TUBE CONNECTING 12X1/4 (SUCTIONS) ×2 IMPLANT
TUBE SALEM SUMP 16 FR W/ARV (TUBING) ×1 IMPLANT
TUBING EXTENTION W/L.L. (IV SETS) ×1 IMPLANT
TUBING STRAIGHTSHOT EPS 5PK (TUBING) ×2 IMPLANT
WATER STERILE IRR 1000ML POUR (IV SOLUTION) ×2 IMPLANT

## 2019-04-15 NOTE — Op Note (Signed)
Operative Note:  ENDOSCOPIC TRANSSPHENOIDAL PITUITARY RESECTION WITH NAVIGATION      Patient: [name]  Medical record number: [id number]  Date:04/15/2019  Pre-operative Indications: 1.  Pituitary Mass     2.  Status post prior transseptal pituitary resection       Postoperative Indications: Same  Surgical Procedure: 1.  Revision endoscopic transsphenoidal pituitary resection with intraoperative navigation    2. Bilateral endoscopic sphenoidotomy with removal of tissue and Fusion navigation     3.  Right middle turbinate mucosal free graft    Anesthesia: GET  Surgeon: Delsa Bern, M.D.  Neurosurgeon: Dr. 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  Intraoperative CSF leak.  Reconstruction with duragraft, abdominal fat graft, Adheris dural glue and right middle turbinate mucosal free graft. Naso-pore sphenoid packing placed.  Note: The neurosurgical component of the operative procedure is dictated as a separate operative note.   Brief History: The patient is a 63 y.o. female with a history of pituitary mass. The patient has a history of previous transseptal, transsphenoidal pituitary resection over 15 years ago.  The patient had recent bacterial meningitis and evaluation of scan showed recurrent pituitary mass with superior extension.  No significant visual changes. The patient was referred to Dr. Zada Finders for neurosurgical evaluation.  Patient seen by me at [facility] preoperatively with review of nasal anatomy and sinus CT scan for navigation.  Given the patient's history and findings, the above surgical procedures were recommended, risks and benefits were discussed in detail with the patient.  They understand and agree with our plan for surgery which is scheduled at [facility] under general anesthesia.  Surgical Procedure: The patient is brought to the neurosurgical operating room on  04/15/2019 and placed in supine position on the operating table. General endotracheal anesthesia was established without difficulty. When the patient was adequately anesthetized, surgical timeout was performed with correct identification of the patient and the surgical procedure. The patient's nose was then injected with 6 cc of 1% lidocaine 1:100,000 dilution epinephrine which was injected in a submucosal fashion. The patient's nose was then packed with Afrin-soaked cottonoid pledgets were left in place for approximately 10 minutes to allow for vasoconstriction and hemostasis.  The Xomed Fusion navigation headgear was applied and anatomic and surgical landmarks were identified and confirmed, navigation was used throughout the sinus component of the surgical procedure.  With the patient prepped draped and prepared for surgery, nasal endoscopy was performed on the patient's right.  The middle turbinate was carefully lateralized to allow access to the posterior aspect of the nasal passageway.  The right sphenoid sinus ostium was identified.  The inferior aspect of the superior turbinate was then resected with through-cutting forceps and a microdebrider.  The right sphenoid sinus ostium was enlarged in a superior and lateral direction using the microdebrider and through-cutting forceps to create a widely patent ostium.  Nasal endoscopy on the patient's left-hand side was then undertaken.  The left middle turbinate was lateralized and the posterior nasal cavity was visualized with identification of the left sphenoid sinus ostium using navigation.  The inferior aspect of the superior turbinate was resected and the sinus ostium was enlarged in the lateral and superior direction to create a wide sphenoid sinus ostium.  A posterior septectomy was then performed with a Surveyor, quantity.  Bone, cartilage and soft tissue was then resected to create a wide posterior septotomy.  The anterior face of the  sphenoid sinus and  sphenoid sinus septum were then resected with a combination of through-cutting forceps, osteotome and microdebrider drill to allow direct access to the entire posterior aspect of the sphenoid sinus and pituitary fossa.  Sphenoid sinus mucosa overlying the pituitary fossa was elevated and lateralized.  the anterior face of the pituitary fossa was demarcated using navigation.  The patient undergone prior transseptal pituitary resection, there was significant bony overgrowth and this was removed using the microdebrider drill to allow access to the pituitary fossa.  With adequate access to the pituitary fossa the neurosurgical component of the procedure was begun by Dr. Zada Finders.  This is dictated as a separate operative report.  Resection of the pituitary tumor was undertaken using direct visualization of the 0 degree endoscope, navigation and blunt and sharp dissection.  With pituitary tumor resection completed, reconstruction was undertaken. Reconstruction with Duragraft, abdominal fat graft, Adheris dural glue and right middle turbinate mucosal free graft.Sphenoid sinus was loosely packed with Naso-pore absorbable nasal packing.  There was no active bleeding and no evidence of spinal fluid leak.  The sphenoid sinus was carefully inspected, no further bleeding along the mucosal margins, sphenoidotomy sites or posterior septectomy.  The patient's nasal cavity was irrigated and suctioned.  Surgical sponge count was correct. An oral gastric tube was passed and the stomach contents were aspirated. Patient was awakened from anesthetic and transferred from the operating room to the recovery room in stable condition. There were no complications and blood loss was 100 cc.   Delsa Bern, M.D. [facility] 04/15/2019

## 2019-04-15 NOTE — Op Note (Signed)
PATIENT: [name]  DAY OF SURGERY: 04/15/19   PRE-OPERATIVE DIAGNOSIS:  Pituitary adenoma   POST-OPERATIVE DIAGNOSIS:  Pituitary adenoma   PROCEDURE:  Repeat endoscopic endonasal transphenoidal resection of pituitary tumor, right abdominal fat graft harvest for skull base reconstruction   SURGEON:  Surgeon(s) and Role:    Judith Part, MD - Co-surgeon    Jerrell Belfast MD - Co-surgeon   ANESTHESIA: ETGA   BRIEF HISTORY: This is a 63 year old woman who previously had a transphenoidal resection of a non-functioning pituitary tumor in 2006. 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 This was discussed with the patient as well as risks, benefits, and alternatives and wished to proceed with surgical treatment.   OPERATIVE DETAIL: Dr. Wilburn Cornelia performed the nasal portion of the approach and is dictated separately. Given the amount of scar tissue present, it was an excellent exposure that allowed good space to access the sella.  For the neurosurgical portion of the case, Dr. Wilburn Cornelia and I used a combination of visual anatomic landmarks and frameless stereotaxy to confirm orientation and anatomy. There was, as expected, extensive scarring that made it difficult to find any normal tissue planes during the dissection. Upon opening what appeared to be the scarred / reconstructed ventral dura of the sella, there was an immediate egress of blood products with CSF. After further dissection, it appeared that the material in the inferoposterior sphenoid was a mix of scar tissue and an old fat graft. This was partially resected to try to find a normal plane with the inferior dura, but no plane developed. While accessing these areas, resected tissue was sent as specimen to pathology for analysis. To obtain better orientation, I removed material from the inferior and posterior aspect of  the sella to make space for superior tumor contents as well as establish a layer of native dura. Given that there was still a rim of bone in this area, I was able to clear it off. I established what I believe was scarred dura and followed this superiorly to find multiple defects in the diaphragma. There was scarred tissue that appeared to traverse a defect in the diaphragma, which we treated as infundibulum leading to likely residual pituitary gland, which was also preserved. There were a few areas of calcified scar tissue that appeared to be likely fat graft that were still stuck to the edges of the dura. Given how friable the dura was, these were not removed. At this point, the sella was empty except for what appeared to be normal gland. Given the CSF leak, I presumed that the diaphragma would not fall down and explored the superior aspect of it for residual tumor. Through the defect, using the endoscope, we were able to visualize tissue scarred to the diaphragma that appeared to be optic chiasm. There was no plane between this and the diaphragma. The chiasm appeared well decompressed and not in a 'parachute' configuration. Given the perforators in this region, I did not attempt to dissect this plane free and felt this to be a maximal safe resection.   A transverse linear incision was then made in the right side of the abdomen and a fat graft was harvested for skull base reconstruction. Hemostasis was obtained after graft harvest, the wound was irrigated, then closed in layers with a subcuticular suture and dermabond overlying.  Attention was then returned to reconstruction of the skull base  and the durotomies. A piece of duragen (Medtronic) was placed in the sella to cover the entire superior and posterior portions and span the defects. The fat graft was then placed into the sella to buttress the dural graft, followed by a layer of Adheris (Stryker) fibrin glue. Dr. Wilburn Cornelia then completed the remainder of  the reconstruction.  EBL:  41mL   DRAINS: none   SPECIMENS: Pituitary tumor   Judith Part, MD 04/15/19 7:39 AM

## 2019-04-15 NOTE — Anesthesia Postprocedure Evaluation (Signed)
Anesthesia Post Note  Patient: QUIN MCPHERSON  Procedure(s) Performed: Transphenoid resection of pituitary tumor with fat graft (N/A Nose) TRANSNASAL APPROACH (N/A Nose) ENDOSCOPIC TRANS NASAL APPROACH WITH FUSIONN (N/A Nose)     Patient location during evaluation: PACU Anesthesia Type: General Level of consciousness: awake and alert Pain management: pain level controlled Vital Signs Assessment: post-procedure vital signs reviewed and stable Respiratory status: spontaneous breathing, nonlabored ventilation, respiratory function stable and patient connected to nasal cannula oxygen Cardiovascular status: blood pressure returned to baseline and stable Postop Assessment: no apparent nausea or vomiting Anesthetic complications: no    Last Vitals:  Vitals:   04/15/19 1300 04/15/19 1400  BP:  135/73  Pulse: 72 76  Resp: 13 12  Temp: 36.6 C   SpO2: 95% 95%    Last Pain:  Vitals:   04/15/19 1320  TempSrc:   PainSc: 0-No pain                 Shantelle Alles L Shakena Callari

## 2019-04-15 NOTE — Transfer of Care (Signed)
Immediate Anesthesia Transfer of Care Note  Patient: MANPREET KEMMER  Procedure(s) Performed: Transphenoid resection of pituitary tumor with fat graft (N/A Nose) TRANSNASAL APPROACH (N/A Nose) ENDOSCOPIC TRANS NASAL APPROACH WITH FUSIONN (N/A Nose)  Patient Location: PACU  Anesthesia Type:General  Level of Consciousness: drowsy and patient cooperative  Airway & Oxygen Therapy: Patient Spontanous Breathing and Patient connected to face mask oxygen  Post-op Assessment: Report given to RN and Post -op Vital signs reviewed and stable  Post vital signs: Reviewed and stable  Last Vitals:  Vitals Value Taken Time  BP    Temp    Pulse 79 04/15/19 1102  Resp 16 04/15/19 1102  SpO2 100 % 04/15/19 1102  Vitals shown include unvalidated device data.  Last Pain:  Vitals:   04/15/19 0654  TempSrc:   PainSc: 0-No pain         Complications: No apparent anesthesia complications

## 2019-04-15 NOTE — H&P (Signed)
Surgical H&P Update  HPI: 63 y.o. woman with previously resected pituitary adenoma. She went to the ED for a headache and was diagnosed with bacterial meningitis with MRI showing recurrence of her adenoma. She is now here for repeat resection in a joint case with Dr. Constance Holster. No new symptoms, no changes in vision or diplopia.  PMHx:  Past Medical History:  Diagnosis Date  . Anemia   . Breast cancer ([facility]) 12/29/13   right 6:00 o'clock, lower outer  . Cataract of left eye   . Diabetes mellitus    type 2  . Family history of malignant neoplasm of breast   . Glaucoma    MD just watching, no eye drops  . High cholesterol   . History of blood transfusion 2015   with chemo treatments  . History of radiation therapy 08/12/14- 10/11/14   right breast /50.4 Gy/28 fx, right breast boost/ 10 Gy/ 5 fx  . HTN (hypertension)   . Personal history of chemotherapy   . Personal history of radiation therapy   . Pituitary adenoma ([place] [facility]) 10/30/2018  . Wears dentures    full top-partial bottom  . Wears dentures    full  . Wears glasses   . Wears glasses    FamHx:  Family History  Problem Relation Age of Onset  . Hypertension Mother   . Diabetes type II Mother   . Breast cancer Mother        dx ~72; deceased 12  . Hypertension Father   . Breast cancer Maternal Aunt        deceased 54  . Cancer Maternal Uncle        unk. type; deceased 108s  . Cancer Maternal Uncle        unk. type; deceased late 70s  . Uterine cancer Cousin 30       daughter of an unaffected mat aunt who is 19   SocHx:  reports that she quit smoking about 25 years ago. Her smoking use included cigarettes. She has never used smokeless tobacco. She reports previous alcohol use. She reports that she does not use drugs.  Physical Exam: AOx3, PERRL, FS, TM  Strength 5/5 x4, SILTx4  Assesment/Plan: 63 y.o. woman with recurrent pituitary adenoma, here for repeat resection. Risks, benefits, and alternatives discussed and the patient  would like to continue with surgery.  -OR today -4N post-op  Judith Part, MD 04/15/19 6:54 AM

## 2019-04-15 NOTE — Progress Notes (Signed)
Pt belongings at bedside include:  1 black purse 1 cell phone 3 keys 1 pair slippers 1 pair black tennis shoes 1 set of dentures (upper and lower) 1 pair glasses 1 phone charger 1 lotion 1 deodorant Clothes  No cash/check/cards

## 2019-04-15 NOTE — Anesthesia Procedure Notes (Signed)
Arterial Line Insertion Start/End8/03/2019 7:00 AM, 04/15/2019 7:15 AM  Patient location: Pre-op. Preanesthetic checklist: patient identified, IV checked, site marked, risks and benefits discussed, surgical consent, monitors and equipment checked, pre-op evaluation, timeout performed and anesthesia consent Lidocaine 1% used for infiltration Left, radial was placed Catheter size: 20 G Hand hygiene performed  and maximum sterile barriers used  Allen's test indicative of satisfactory collateral circulation Attempts: 2 Procedure performed without using ultrasound guided technique. Following insertion, dressing applied and Biopatch. Post procedure assessment: normal and unchanged  Patient tolerated the procedure well with no immediate complications. Additional procedure comments: 1st attempt by SRNA, attained blood return + pulse, unable to pass catheter over wire.Marland Kitchen

## 2019-04-15 NOTE — Anesthesia Procedure Notes (Signed)
Procedure Name: Intubation Date/Time: 04/15/2019 7:50 AM Performed by: Kathryne Hitch, CRNA Pre-anesthesia Checklist: Patient identified, Emergency Drugs available, Suction available and Patient being monitored Patient Re-evaluated:Patient Re-evaluated prior to induction Oxygen Delivery Method: Circle system utilized Preoxygenation: Pre-oxygenation with 100% oxygen Induction Type: IV induction Ventilation: Mask ventilation without difficulty Laryngoscope Size: Mac and 4 Grade View: Grade I Tube type: Oral Tube size: 7.0 mm Number of attempts: 2 Airway Equipment and Method: Stylet and Oral airway Placement Confirmation: ETT inserted through vocal cords under direct vision,  positive ETCO2 and breath sounds checked- equal and bilateral Secured at: 22 cm Tube secured with: Tape Dental Injury: Teeth and Oropharynx as per pre-operative assessment  Comments: SRNA 1st attempt to DL with MIL 2, unable to attain view, Grade IIa with MAC 4, ETT passed atraumatically through cords.

## 2019-04-16 LAB — RENAL FUNCTION PANEL
Albumin: 3.3 g/dL — ABNORMAL LOW (ref 3.5–5.0)
Anion gap: 13 (ref 5–15)
BUN: 39 mg/dL — ABNORMAL HIGH (ref 8–23)
CO2: 19 mmol/L — ABNORMAL LOW (ref 22–32)
Calcium: 9.2 mg/dL (ref 8.9–10.3)
Chloride: 105 mmol/L (ref 98–111)
Creatinine, Ser: 1.5 mg/dL — ABNORMAL HIGH (ref 0.44–1.00)
GFR calc Af Amer: 43 mL/min — ABNORMAL LOW (ref >60)
GFR calc non Af Amer: 37 mL/min — ABNORMAL LOW (ref >60)
Glucose, Bld: 257 mg/dL — ABNORMAL HIGH (ref 70–99)
Phosphorus: 5.1 mg/dL — ABNORMAL HIGH (ref 2.5–4.6)
Potassium: 4.4 mmol/L (ref 3.5–5.1)
Sodium: 137 mmol/L (ref 135–145)

## 2019-04-16 LAB — GLUCOSE, CAPILLARY
Glucose-Capillary: 213 mg/dL — ABNORMAL HIGH (ref 70–99)
Glucose-Capillary: 215 mg/dL — ABNORMAL HIGH (ref 70–99)
Glucose-Capillary: 199 mg/dL — ABNORMAL HIGH (ref 70–99)
Glucose-Capillary: 245 mg/dL — ABNORMAL HIGH (ref 70–99)

## 2019-04-16 MED ORDER — CHLORHEXIDINE GLUCONATE CLOTH 2 % EX PADS
6.0000 | MEDICATED_PAD | Freq: Every day | CUTANEOUS | Status: DC
  Administered 2019-04-16 – 2019-04-21 (×6): 6 via TOPICAL

## 2019-04-16 NOTE — Progress Notes (Signed)
Patient ID: [name], female   DOB: August 20, 1956, 63 y.o.   MRN: [id number] Vital signs are stable Patient is comfortable vision is stable No signs of DI patient appears to be keeping up with taking liquid and voiding Catheter is out Void is being checked carefully Continue to monitor status

## 2019-04-17 LAB — RENAL FUNCTION PANEL
Albumin: 3.4 g/dL — ABNORMAL LOW (ref 3.5–5.0)
Anion gap: 12 (ref 5–15)
BUN: 40 mg/dL — ABNORMAL HIGH (ref 8–23)
CO2: 23 mmol/L (ref 22–32)
Calcium: 9.7 mg/dL (ref 8.9–10.3)
Chloride: 106 mmol/L (ref 98–111)
Creatinine, Ser: 1.65 mg/dL — ABNORMAL HIGH (ref 0.44–1.00)
GFR calc Af Amer: 38 mL/min — ABNORMAL LOW (ref >60)
GFR calc non Af Amer: 33 mL/min — ABNORMAL LOW (ref >60)
Glucose, Bld: 182 mg/dL — ABNORMAL HIGH (ref 70–99)
Phosphorus: 4.6 mg/dL (ref 2.5–4.6)
Potassium: 4.8 mmol/L (ref 3.5–5.1)
Sodium: 141 mmol/L (ref 135–145)

## 2019-04-17 LAB — CBC WITH DIFFERENTIAL/PLATELET
Abs Immature Granulocytes: 0.06 10*3/uL (ref 0.00–0.07)
Basophils Absolute: 0 10*3/uL (ref 0.0–0.1)
Basophils Relative: 0 %
Eosinophils Absolute: 0.1 10*3/uL (ref 0.0–0.5)
Eosinophils Relative: 1 %
HCT: 28.7 % — ABNORMAL LOW (ref 36.0–46.0)
Hemoglobin: 8.8 g/dL — ABNORMAL LOW (ref 12.0–15.0)
Immature Granulocytes: 1 %
Lymphocytes Relative: 20 %
Lymphs Abs: 2.4 10*3/uL (ref 0.7–4.0)
MCH: 27.6 pg (ref 26.0–34.0)
MCHC: 30.7 g/dL (ref 30.0–36.0)
MCV: 90 fL (ref 80.0–100.0)
Monocytes Absolute: 0.7 10*3/uL (ref 0.1–1.0)
Monocytes Relative: 6 %
Neutro Abs: 8.9 10*3/uL — ABNORMAL HIGH (ref 1.7–7.7)
Neutrophils Relative %: 72 %
Platelets: 230 10*3/uL (ref 150–400)
RBC: 3.19 MIL/uL — ABNORMAL LOW (ref 3.87–5.11)
RDW: 15.5 % (ref 11.5–15.5)
WBC: 12.2 10*3/uL — ABNORMAL HIGH (ref 4.0–10.5)
nRBC: 0 % (ref 0.0–0.2)

## 2019-04-17 LAB — GLUCOSE, CAPILLARY
Glucose-Capillary: 139 mg/dL — ABNORMAL HIGH (ref 70–99)
Glucose-Capillary: 199 mg/dL — ABNORMAL HIGH (ref 70–99)
Glucose-Capillary: 159 mg/dL — ABNORMAL HIGH (ref 70–99)
Glucose-Capillary: 153 mg/dL — ABNORMAL HIGH (ref 70–99)

## 2019-04-17 NOTE — Progress Notes (Addendum)
Patient ID: [name], female   DOB: [birth year], 63 y.o.   MRN: [id number] Patient noted to be febrile this morning to 102 degrees She has experienced some chills however she does not appear toxic and notes no significant discomfort other than the chills.  She denies any difficulty with breathing denies any difficulty with urinating. On examination she is alert and oriented Lungs are clear to auscultation bilaterally. Last CBC was from the seventh I will order a new CBC and blood cultures x2 She is already on Keflex.  We will continue to monitor No discharge today She has not had any drainage from the nose and the mustache dressing has been dry

## 2019-04-18 ENCOUNTER — Encounter (HOSPITAL_COMMUNITY): Payer: Self-pay | Admitting: Neurological Surgery

## 2019-04-18 LAB — RENAL FUNCTION PANEL
Albumin: 3.4 g/dL — ABNORMAL LOW (ref 3.5–5.0)
Anion gap: 10 (ref 5–15)
BUN: 42 mg/dL — ABNORMAL HIGH (ref 8–23)
CO2: 23 mmol/L (ref 22–32)
Calcium: 9 mg/dL (ref 8.9–10.3)
Chloride: 105 mmol/L (ref 98–111)
Creatinine, Ser: 1.85 mg/dL — ABNORMAL HIGH (ref 0.44–1.00)
GFR calc Af Amer: 33 mL/min — ABNORMAL LOW (ref >60)
GFR calc non Af Amer: 29 mL/min — ABNORMAL LOW (ref >60)
Glucose, Bld: 171 mg/dL — ABNORMAL HIGH (ref 70–99)
Phosphorus: 3.4 mg/dL (ref 2.5–4.6)
Potassium: 4.7 mmol/L (ref 3.5–5.1)
Sodium: 138 mmol/L (ref 135–145)

## 2019-04-18 LAB — GLUCOSE, CAPILLARY
Glucose-Capillary: 170 mg/dL — ABNORMAL HIGH (ref 70–99)
Glucose-Capillary: 148 mg/dL — ABNORMAL HIGH (ref 70–99)
Glucose-Capillary: 118 mg/dL — ABNORMAL HIGH (ref 70–99)
Glucose-Capillary: 194 mg/dL — ABNORMAL HIGH (ref 70–99)

## 2019-04-18 MED ORDER — LIDOCAINE HCL (PF) 1 % IJ SOLN
INTRAMUSCULAR | Status: AC
  Administered 2019-04-18: 15:00:00
  Filled 2019-04-18: qty 5

## 2019-04-18 MED ORDER — FENTANYL CITRATE (PF) 100 MCG/2ML IJ SOLN
50.0000 ug | Freq: Once | INTRAMUSCULAR | Status: DC | PRN
  Filled 2019-04-18: qty 2

## 2019-04-18 NOTE — Progress Notes (Signed)
79 called Dr. Zada Finders to notify of clear drainage from nose with ambulation. No new orders at this time. Patient transferred to 4NP. Report given to [facility]. Dentures in place. Glasses, cell phone, and belongings transferred with patient and placed on bedside table.

## 2019-04-18 NOTE — Progress Notes (Signed)
Failed bedside lumbar CSF drain, will need fluoroscopy. Discussed with IR, will keep NPO after midnight. I am on call tomorrow and available if any assistance is needed during placement.

## 2019-04-18 NOTE — Progress Notes (Addendum)
Neurosurgery Service Progress Note  Subjective: No acute events overnight. Subjectively feels vision is grossly stable from preop, maybe worse in the right temporal field   Objective: Vitals:   04/18/19 0400 04/18/19 0500 04/18/19 0600 04/18/19 0700  BP: 104/68 117/72 102/64 102/62  Pulse: (!) 105  (!) 103 (!) 102  Resp: 20 19 18 16   Temp: 98.7 F (37.1 C)     TempSrc: Oral     SpO2: 92% 93% 93% 92%  Weight:      Height:       Temp (24hrs), Avg:100.4 F (38 C), Min:98.7 F (37.1 C), Max:102.3 F (39.1 C)  CBC Latest Ref Rng & Units 04/17/2019 04/15/2019 04/06/2019  WBC 4.0 - 10.5 K/uL 12.2(H) 8.7 5.7  Hemoglobin 12.0 - 15.0 g/dL 8.8(L) 9.2(L) 9.8(L)  Hematocrit 36.0 - 46.0 % 28.7(L) 29.7(L) 32.8(L)  Platelets 150 - 400 K/uL 230 229 213   BMP Latest Ref Rng & Units 04/18/2019 04/17/2019 04/16/2019  Glucose 70 - 99 mg/dL 171(H) 182(H) 257(H)  BUN 8 - 23 mg/dL 42(H) 40(H) 39(H)  Creatinine 0.44 - 1.00 mg/dL 1.85(H) 1.65(H) 1.50(H)  Sodium 135 - 145 mmol/L 138 141 137  Potassium 3.5 - 5.1 mmol/L 4.7 4.8 4.4  Chloride 98 - 111 mmol/L 105 106 105  CO2 22 - 32 mmol/L 23 23 19(L)  Calcium 8.9 - 10.3 mg/dL 9.0 9.7 9.2    Intake/Output Summary (Last 24 hours) at 04/18/2019 0725 Last data filed at 04/18/2019 0600 Gross per 24 hour  Intake 2160 ml  Output 3350 ml  Net -1190 ml    Current Facility-Administered Medications:  .  0.9 %  sodium chloride infusion, , Intravenous, PRN, Judith Part, MD, Stopped at 04/16/19 [phone] .  acetaminophen (TYLENOL) tablet 650 mg, 650 mg, Oral, Q4H PRN, 650 mg at 04/17/19 1433 **OR** acetaminophen (TYLENOL) suppository 650 mg, 650 mg, Rectal, Q4H PRN, Judith Part, MD .  amLODipine (NORVASC) tablet 10 mg, 10 mg, Oral, Daily, Prathik Aman, Joyice Faster, MD, 10 mg at 04/17/19 1478 .  atorvastatin (LIPITOR) tablet 40 mg, 40 mg, Oral, Daily, Judith Part, MD, 40 mg at 04/17/19 1653 .  cephALEXin (KEFLEX) capsule 500 mg, 500 mg, Oral, Q8H,  Jerrell Belfast, MD, 500 mg at 04/18/19 0617 .  Chlorhexidine Gluconate Cloth 2 % PADS 6 each, 6 each, Topical, Daily, Judith Part, MD, 6 each at 04/17/19 1422 .  docusate sodium (COLACE) capsule 100 mg, 100 mg, Oral, BID, Judith Part, MD, 100 mg at 04/17/19 2227 .  gabapentin (NEURONTIN) capsule 300 mg, 300 mg, Oral, TID, Judith Part, MD, 300 mg at 04/17/19 2227 .  glyBURIDE (DIABETA) tablet 5 mg, 5 mg, Oral, BID WC, Haylynn Pha A, MD, 5 mg at 04/17/19 1652 .  heparin injection 5,000 Units, 5,000 Units, Subcutaneous, Q8H, Judith Part, MD, 5,000 Units at 04/18/19 0618 .  lisinopril (ZESTRIL) tablet 40 mg, 40 mg, Oral, Daily, 40 mg at 04/17/19 0829 **AND** hydrochlorothiazide (HYDRODIURIL) tablet 25 mg, 25 mg, Oral, Daily, Judith Part, MD, 25 mg at 04/17/19 0829 .  HYDROcodone-acetaminophen (NORCO/VICODIN) 5-325 MG per tablet 1 tablet, 1 tablet, Oral, Q4H PRN, Judith Part, MD, 1 tablet at 04/17/19 0211 .  HYDROmorphone (DILAUDID) injection 0.5 mg, 0.5 mg, Intravenous, Q3H PRN, Ansley Mangiapane A, MD .  insulin aspart (novoLOG) injection 0-15 Units, 0-15 Units, Subcutaneous, TID WC, Iyad Deroo, Joyice Faster, MD, 3 Units at 04/17/19 1653 .  iron polysaccharides (NIFEREX) capsule 150 mg, 150 mg,  Oral, BID, Judith Part, MD, 150 mg at 04/17/19 2227 .  labetalol (NORMODYNE) injection 10-40 mg, 10-40 mg, Intravenous, Q10 min PRN, Judith Part, MD, 10 mg at 04/15/19 1235 .  metFORMIN (GLUCOPHAGE) tablet 500 mg, 500 mg, Oral, BID WC, Zenith Lamphier, Joyice Faster, MD, 500 mg at 04/17/19 1653 .  ondansetron (ZOFRAN) tablet 4 mg, 4 mg, Oral, Q4H PRN **OR** ondansetron (ZOFRAN) injection 4 mg, 4 mg, Intravenous, Q4H PRN, Tysen Roesler A, MD .  polyethylene glycol (MIRALAX / GLYCOLAX) packet 17 g, 17 g, Oral, Daily PRN, Judith Part, MD .  promethazine (PHENERGAN) tablet 12.5-25 mg, 12.5-25 mg, Oral, Q4H PRN, Judith Part, MD .  sodium  chloride (OCEAN) 0.65 % nasal spray 4 spray, 4 spray, Each Nare, Q1H PRN, Jerrell Belfast, MD .  tiZANidine (ZANAFLEX) tablet 4 mg, 4 mg, Oral, Q8H PRN, Judith Part, MD   Physical Exam: AOx3, PERRL, EOMI, FS, Strength 5/5 x4, SILTx4, VF with partial bitemporal hemianopsia, nasal sling dry  Assessment & Plan: 63 y.o. woman s/p repeat transphenoidal resection of pituitary mass, recovering well. -rpt RFP tomorrow to confirm creatinine plateau, appears to have a baseline around 1.5 -will keep in-house for repeat labs and to make sure she doesn't have any nasal drainage / CSF leak when she starts mobilizing -possible discharge tomorrow -SCDs/TEDs  Addendum: pt having drainage now that she's ambulating, will have to place lumbar drain to allow leak to seal   [place] A Carnisha Feltz  04/18/19 7:25 AM

## 2019-04-19 ENCOUNTER — Inpatient Hospital Stay (HOSPITAL_COMMUNITY): Payer: BC Managed Care – PPO

## 2019-04-19 ENCOUNTER — Encounter (HOSPITAL_COMMUNITY): Payer: Self-pay | Admitting: Interventional Radiology

## 2019-04-19 HISTORY — PX: IR IMAGE GUIDED FLUID DRAIN BY CATHETER: IMG5463

## 2019-04-19 LAB — GLUCOSE, CAPILLARY
Glucose-Capillary: 181 mg/dL — ABNORMAL HIGH (ref 70–99)
Glucose-Capillary: 164 mg/dL — ABNORMAL HIGH (ref 70–99)
Glucose-Capillary: 118 mg/dL — ABNORMAL HIGH (ref 70–99)

## 2019-04-19 LAB — RENAL FUNCTION PANEL
Albumin: 3 g/dL — ABNORMAL LOW (ref 3.5–5.0)
Anion gap: 13 (ref 5–15)
BUN: 49 mg/dL — ABNORMAL HIGH (ref 8–23)
CO2: 21 mmol/L — ABNORMAL LOW (ref 22–32)
Calcium: 8.8 mg/dL — ABNORMAL LOW (ref 8.9–10.3)
Chloride: 102 mmol/L (ref 98–111)
Creatinine, Ser: 1.75 mg/dL — ABNORMAL HIGH (ref 0.44–1.00)
GFR calc Af Amer: 36 mL/min — ABNORMAL LOW (ref >60)
GFR calc non Af Amer: 31 mL/min — ABNORMAL LOW (ref >60)
Glucose, Bld: 180 mg/dL — ABNORMAL HIGH (ref 70–99)
Phosphorus: 3.9 mg/dL (ref 2.5–4.6)
Potassium: 4.1 mmol/L (ref 3.5–5.1)
Sodium: 136 mmol/L (ref 135–145)

## 2019-04-19 MED ORDER — LIDOCAINE HCL 1 % IJ SOLN
INTRAMUSCULAR | Status: AC
  Filled 2019-04-19: qty 20

## 2019-04-19 MED ORDER — LIDOCAINE HCL (PF) 1 % IJ SOLN
INTRAMUSCULAR | Status: DC | PRN
  Administered 2019-04-19: 10 mL

## 2019-04-19 NOTE — Progress Notes (Signed)
Neurosurgery Service Progress Note  Subjective: No acute events overnight, still having some clear rhinorrhea   Objective: Vitals:   04/18/19 2000 04/19/19 0012 04/19/19 0433 04/19/19 0752  BP:  112/64 119/70 (!) 143/73  Pulse:  93 88 (!) 120  Resp:    20  Temp: 98.6 F (37 C) 98.6 F (37 C) 98 F (36.7 C) 98.4 F (36.9 C)  TempSrc:  Oral Oral Oral  SpO2:  95% 97% 90%  Weight:      Height:       Temp (24hrs), Avg:99.4 F (37.4 C), Min:98 F (36.7 C), Max:102.2 F (39 C)  CBC Latest Ref Rng & Units 04/17/2019 04/15/2019 04/06/2019  WBC 4.0 - 10.5 K/uL 12.2(H) 8.7 5.7  Hemoglobin 12.0 - 15.0 g/dL 8.8(L) 9.2(L) 9.8(L)  Hematocrit 36.0 - 46.0 % 28.7(L) 29.7(L) 32.8(L)  Platelets 150 - 400 K/uL 230 229 213   BMP Latest Ref Rng & Units 04/19/2019 04/18/2019 04/17/2019  Glucose 70 - 99 mg/dL 180(H) 171(H) 182(H)  BUN 8 - 23 mg/dL 49(H) 42(H) 40(H)  Creatinine 0.44 - 1.00 mg/dL 1.75(H) 1.85(H) 1.65(H)  Sodium 135 - 145 mmol/L 136 138 141  Potassium 3.5 - 5.1 mmol/L 4.1 4.7 4.8  Chloride 98 - 111 mmol/L 102 105 106  CO2 22 - 32 mmol/L 21(L) 23 23  Calcium 8.9 - 10.3 mg/dL 8.8(L) 9.0 9.7    Intake/Output Summary (Last 24 hours) at 04/19/2019 1034 Last data filed at 04/19/2019 0947 Gross per 24 hour  Intake 480 ml  Output 2275 ml  Net -1795 ml    Current Facility-Administered Medications:  .  0.9 %  sodium chloride infusion, , Intravenous, PRN, Judith Part, MD, Stopped at 04/16/19 [phone] .  acetaminophen (TYLENOL) tablet 650 mg, 650 mg, Oral, Q4H PRN, 650 mg at 04/18/19 1835 **OR** acetaminophen (TYLENOL) suppository 650 mg, 650 mg, Rectal, Q4H PRN, Judith Part, MD .  amLODipine (NORVASC) tablet 10 mg, 10 mg, Oral, Daily, Shavonna Corella, Joyice Faster, MD, Stopped at 04/19/19 1000 .  atorvastatin (LIPITOR) tablet 40 mg, 40 mg, Oral, Daily, Judith Part, MD, 40 mg at 04/18/19 1735 .  cephALEXin (KEFLEX) capsule 500 mg, 500 mg, Oral, Q8H, Jerrell Belfast, MD, 500 mg at  04/19/19 0522 .  Chlorhexidine Gluconate Cloth 2 % PADS 6 each, 6 each, Topical, Daily, Judith Part, MD, 6 each at 04/19/19 1000 .  docusate sodium (COLACE) capsule 100 mg, 100 mg, Oral, BID, Rosilyn Coachman, Joyice Faster, MD, Stopped at 04/19/19 1000 .  fentaNYL (SUBLIMAZE) injection 50 mcg, 50 mcg, Intravenous, Once PRN, Judith Part, MD .  gabapentin (NEURONTIN) capsule 300 mg, 300 mg, Oral, TID, Denisha Hoel, Joyice Faster, MD, Stopped at 04/19/19 1000 .  glyBURIDE (DIABETA) tablet 5 mg, 5 mg, Oral, BID WC, Nichole Keltner A, MD, 5 mg at 04/18/19 1655 .  heparin injection 5,000 Units, 5,000 Units, Subcutaneous, Q8H, Judith Part, MD, 5,000 Units at 04/18/19 2112 .  lisinopril (ZESTRIL) tablet 40 mg, 40 mg, Oral, Daily, Stopped at 04/19/19 1000 **AND** hydrochlorothiazide (HYDRODIURIL) tablet 25 mg, 25 mg, Oral, Daily, Landi Biscardi, Joyice Faster, MD, Stopped at 04/19/19 1000 .  HYDROcodone-acetaminophen (NORCO/VICODIN) 5-325 MG per tablet 1 tablet, 1 tablet, Oral, Q4H PRN, Judith Part, MD, 1 tablet at 04/17/19 0211 .  HYDROmorphone (DILAUDID) injection 0.5 mg, 0.5 mg, Intravenous, Q3H PRN, Jevon Littlepage A, MD .  insulin aspart (novoLOG) injection 0-15 Units, 0-15 Units, Subcutaneous, TID WC, Judith Part, MD, 3 Units at 04/19/19 [phone] .  iron polysaccharides (NIFEREX) capsule 150 mg, 150 mg, Oral, BID, Celinda Dethlefs, Joyice Faster, MD, Stopped at 04/19/19 1000 .  labetalol (NORMODYNE) injection 10-40 mg, 10-40 mg, Intravenous, Q10 min PRN, Judith Part, MD, 10 mg at 04/15/19 1235 .  metFORMIN (GLUCOPHAGE) tablet 500 mg, 500 mg, Oral, BID WC, Jennika Ringgold, Joyice Faster, MD, 500 mg at 04/18/19 1655 .  ondansetron (ZOFRAN) tablet 4 mg, 4 mg, Oral, Q4H PRN **OR** ondansetron (ZOFRAN) injection 4 mg, 4 mg, Intravenous, Q4H PRN, Charrise Lardner A, MD .  polyethylene glycol (MIRALAX / GLYCOLAX) packet 17 g, 17 g, Oral, Daily PRN, Judith Part, MD .  promethazine (PHENERGAN) tablet  12.5-25 mg, 12.5-25 mg, Oral, Q4H PRN, Judith Part, MD .  sodium chloride (OCEAN) 0.65 % nasal spray 4 spray, 4 spray, Each Nare, Q1H PRN, Jerrell Belfast, MD .  tiZANidine (ZANAFLEX) tablet 4 mg, 4 mg, Oral, Q8H PRN, Judith Part, MD   Physical Exam: AOx3, PERRL, EOMI, FS, Strength 5/5 x4, SILTx4, VFF with mild bitemporal hemianopsia  Assessment & Plan: 63 y.o. 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 Failed bedside lumbar drain placement.  -NPO for IR-assisted LD placement today under fluoro -will drain 10cc/hr x3d and, if no persistent rhinorrhea, then clamp x24h -resume regular diet after drain is placed -keep HOB>30 degrees after drain is placed unless pt begins having headaches  Judith Part  04/19/19 10:34 AM

## 2019-04-19 NOTE — Progress Notes (Signed)
Patient ID: [name], female   DOB: 10-25-1955, 63 y.o.   MRN: [id number]   63 y.o. 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 Failed bedside lumbar drain placement.  Scheduled now for image guided lumbar drain placement  Pt is aware of procedure benefits and risks including but not limited to: Infection; spinal headache; bleeding; transient radiculopathy  Agreeable to proceed Consent signed and in chart

## 2019-04-19 NOTE — Progress Notes (Signed)
1830 called Dr. Zada Finders regarding not output from lumbar drain and patient having fluid leaking from her nose. MD aware. No new orders at this time. Will keep on bedrest.

## 2019-04-20 LAB — GLUCOSE, CAPILLARY
Glucose-Capillary: 179 mg/dL — ABNORMAL HIGH (ref 70–99)
Glucose-Capillary: 198 mg/dL — ABNORMAL HIGH (ref 70–99)
Glucose-Capillary: 161 mg/dL — ABNORMAL HIGH (ref 70–99)

## 2019-04-20 LAB — RENAL FUNCTION PANEL
Albumin: 3.2 g/dL — ABNORMAL LOW (ref 3.5–5.0)
Anion gap: 11 (ref 5–15)
BUN: 46 mg/dL — ABNORMAL HIGH (ref 8–23)
CO2: 21 mmol/L — ABNORMAL LOW (ref 22–32)
Calcium: 8.8 mg/dL — ABNORMAL LOW (ref 8.9–10.3)
Chloride: 103 mmol/L (ref 98–111)
Creatinine, Ser: 1.79 mg/dL — ABNORMAL HIGH (ref 0.44–1.00)
GFR calc Af Amer: 35 mL/min — ABNORMAL LOW (ref >60)
GFR calc non Af Amer: 30 mL/min — ABNORMAL LOW (ref >60)
Glucose, Bld: 168 mg/dL — ABNORMAL HIGH (ref 70–99)
Phosphorus: 3.7 mg/dL (ref 2.5–4.6)
Potassium: 4.5 mmol/L (ref 3.5–5.1)
Sodium: 135 mmol/L (ref 135–145)

## 2019-04-20 MED ORDER — ACETAZOLAMIDE 250 MG PO TABS
500.0000 mg | ORAL_TABLET | Freq: Two times a day (BID) | ORAL | Status: DC
  Administered 2019-04-20 – 2019-04-21 (×4): 500 mg via ORAL
  Filled 2019-04-20 (×5): qty 2

## 2019-04-20 NOTE — Progress Notes (Addendum)
Neurosurgery Service Progress Note  Subjective: No acute events overnight, rhinorrhea last night but none this morning  Objective: Vitals:   04/20/19 0500 04/20/19 0600 04/20/19 0700 04/20/19 0800  BP: 96/60 (!) 99/58 102/64   Pulse: 97 91 89   Resp: 19 17 18    Temp:    98.4 F (36.9 C)  TempSrc:    Oral  SpO2: 90% 91% 94%   Weight:      Height:       Temp (24hrs), Avg:99.2 F (37.3 C), Min:98.2 F (36.8 C), Max:100.1 F (37.8 C)  CBC Latest Ref Rng & Units 04/17/2019 04/15/2019 04/06/2019  WBC 4.0 - 10.5 K/uL 12.2(H) 8.7 5.7  Hemoglobin 12.0 - 15.0 g/dL 8.8(L) 9.2(L) 9.8(L)  Hematocrit 36.0 - 46.0 % 28.7(L) 29.7(L) 32.8(L)  Platelets 150 - 400 K/uL 230 229 213   BMP Latest Ref Rng & Units 04/20/2019 04/19/2019 04/18/2019  Glucose 70 - 99 mg/dL 168(H) 180(H) 171(H)  BUN 8 - 23 mg/dL 46(H) 49(H) 42(H)  Creatinine 0.44 - 1.00 mg/dL 1.79(H) 1.75(H) 1.85(H)  Sodium 135 - 145 mmol/L 135 136 138  Potassium 3.5 - 5.1 mmol/L 4.5 4.1 4.7  Chloride 98 - 111 mmol/L 103 102 105  CO2 22 - 32 mmol/L 21(L) 21(L) 23  Calcium 8.9 - 10.3 mg/dL 8.8(L) 8.8(L) 9.0    Intake/Output Summary (Last 24 hours) at 04/20/2019 0855 Last data filed at 04/20/2019 0700 Gross per 24 hour  Intake 0 ml  Output 1150 ml  Net -1150 ml    Current Facility-Administered Medications:  .  0.9 %  sodium chloride infusion, , Intravenous, PRN, Judith Part, MD, Stopped at 04/16/19 [phone] .  acetaminophen (TYLENOL) tablet 650 mg, 650 mg, Oral, Q4H PRN, 650 mg at 04/20/19 0424 **OR** acetaminophen (TYLENOL) suppository 650 mg, 650 mg, Rectal, Q4H PRN, Judith Part, MD .  amLODipine (NORVASC) tablet 10 mg, 10 mg, Oral, Daily, [place] Burgin, Joyice Faster, MD, Stopped at 04/19/19 1000 .  atorvastatin (LIPITOR) tablet 40 mg, 40 mg, Oral, Daily, Judith Part, MD, 40 mg at 04/19/19 1658 .  cephALEXin (KEFLEX) capsule 500 mg, 500 mg, Oral, Q8H, Jerrell Belfast, MD, 500 mg at 04/20/19 0602 .  Chlorhexidine Gluconate  Cloth 2 % PADS 6 each, 6 each, Topical, Daily, Judith Part, MD, 6 each at 04/19/19 1000 .  docusate sodium (COLACE) capsule 100 mg, 100 mg, Oral, BID, Siennah Barrasso, Joyice Faster, MD, Stopped at 04/19/19 1000 .  fentaNYL (SUBLIMAZE) injection 50 mcg, 50 mcg, Intravenous, Once PRN, Judith Part, MD .  gabapentin (NEURONTIN) capsule 300 mg, 300 mg, Oral, TID, Judith Part, MD, 300 mg at 04/19/19 2152 .  glyBURIDE (DIABETA) tablet 5 mg, 5 mg, Oral, BID WC, Jaquille Kau A, MD, 5 mg at 04/19/19 1559 .  heparin injection 5,000 Units, 5,000 Units, Subcutaneous, Q8H, Hikeem Andersson, Joyice Faster, MD, 5,000 Units at 04/20/19 0602 .  lisinopril (ZESTRIL) tablet 40 mg, 40 mg, Oral, Daily, Stopped at 04/19/19 1000 **AND** hydrochlorothiazide (HYDRODIURIL) tablet 25 mg, 25 mg, Oral, Daily, Orian Amberg, Joyice Faster, MD, Stopped at 04/19/19 1000 .  HYDROcodone-acetaminophen (NORCO/VICODIN) 5-325 MG per tablet 1 tablet, 1 tablet, Oral, Q4H PRN, Judith Part, MD, 1 tablet at 04/17/19 0211 .  HYDROmorphone (DILAUDID) injection 0.5 mg, 0.5 mg, Intravenous, Q3H PRN, Eliese Kerwood A, MD .  insulin aspart (novoLOG) injection 0-15 Units, 0-15 Units, Subcutaneous, TID WC, Chanie Soucek, Joyice Faster, MD, 2 Units at 04/19/19 1229 .  iron polysaccharides (NIFEREX) capsule 150 mg, 150  mg, Oral, BID, Judith Part, MD, Stopped at 04/19/19 1000 .  labetalol (NORMODYNE) injection 10-40 mg, 10-40 mg, Intravenous, Q10 min PRN, Judith Part, MD, 10 mg at 04/15/19 1235 .  lidocaine (PF) (XYLOCAINE) 1 % injection, , , PRN, Jacqulynn Cadet, MD, 10 mL at 04/19/19 1436 .  metFORMIN (GLUCOPHAGE) tablet 500 mg, 500 mg, Oral, BID WC, Jasmin Trumbull, Joyice Faster, MD, 500 mg at 04/20/19 0806 .  ondansetron (ZOFRAN) tablet 4 mg, 4 mg, Oral, Q4H PRN **OR** ondansetron (ZOFRAN) injection 4 mg, 4 mg, Intravenous, Q4H PRN, Gitty Osterlund A, MD .  polyethylene glycol (MIRALAX / GLYCOLAX) packet 17 g, 17 g, Oral, Daily PRN,  Judith Part, MD .  promethazine (PHENERGAN) tablet 12.5-25 mg, 12.5-25 mg, Oral, Q4H PRN, Judith Part, MD .  sodium chloride (OCEAN) 0.65 % nasal spray 4 spray, 4 spray, Each Nare, Q1H PRN, Jerrell Belfast, MD .  tiZANidine (ZANAFLEX) tablet 4 mg, 4 mg, Oral, Q8H PRN, Judith Part, MD   Physical Exam: AOx3, PERRL, EOMI, FS, Strength 5/5 x4, SILTx4, VFF with mild bitemporal hemianopsia No rhinorrhea on exam this morning, LD site c/d/i but no output  Assessment & Plan: 63 y.o. 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 Failed bedside lumbar drain placement. 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 Given that she is not leaking this morning, we can try diamox and see if she can seal the leak. Unfortunately, if lumbar drainage is not an option, she will require an EVD. -start diamox, daily RFPs while on diamox -keep drain in place in case it starts to increase output -bedrest with HOB>30 degrees   Judith Part  04/20/19 8:55 AM

## 2019-04-21 LAB — RENAL FUNCTION PANEL
Albumin: 3.3 g/dL — ABNORMAL LOW (ref 3.5–5.0)
Anion gap: 13 (ref 5–15)
BUN: 44 mg/dL — ABNORMAL HIGH (ref 8–23)
CO2: 20 mmol/L — ABNORMAL LOW (ref 22–32)
Calcium: 9 mg/dL (ref 8.9–10.3)
Chloride: 101 mmol/L (ref 98–111)
Creatinine, Ser: 1.96 mg/dL — ABNORMAL HIGH (ref 0.44–1.00)
GFR calc Af Amer: 31 mL/min — ABNORMAL LOW (ref >60)
GFR calc non Af Amer: 27 mL/min — ABNORMAL LOW (ref >60)
Glucose, Bld: 174 mg/dL — ABNORMAL HIGH (ref 70–99)
Phosphorus: 3.3 mg/dL (ref 2.5–4.6)
Potassium: 4.6 mmol/L (ref 3.5–5.1)
Sodium: 134 mmol/L — ABNORMAL LOW (ref 135–145)

## 2019-04-21 LAB — TYPE AND SCREEN
ABO/RH(D): A POS
Antibody Screen: NEGATIVE
Unit division: 0
Unit division: 0

## 2019-04-21 LAB — GLUCOSE, CAPILLARY
Glucose-Capillary: 135 mg/dL — ABNORMAL HIGH (ref 70–99)
Glucose-Capillary: 93 mg/dL (ref 70–99)
Glucose-Capillary: 80 mg/dL (ref 70–99)
Glucose-Capillary: 102 mg/dL — ABNORMAL HIGH (ref 70–99)

## 2019-04-21 LAB — BPAM RBC
Blood Product Expiration Date: 202008312359
Blood Product Expiration Date: 202008312359
Unit Type and Rh: 6200
Unit Type and Rh: 6200

## 2019-04-21 MED ORDER — SODIUM CHLORIDE 0.9 % IV SOLN
INTRAVENOUS | Status: DC | PRN
  Administered 2019-04-21: 20:00:00 via INTRAVENOUS

## 2019-04-21 NOTE — Progress Notes (Signed)
Neurosurgery Service Progress Note  Subjective: No acute events overnight, no rhinorrhea  Objective: Vitals:   04/21/19 0500 04/21/19 0600 04/21/19 0700 04/21/19 0800  BP: (!) 99/56 136/67 (!) 95/59 (!) 88/54  Pulse: (!) 108 99 94 90  Resp: (!) 21 18 18 18   Temp:      TempSrc:      SpO2: (!) 86% 91% 94% 94%  Weight:      Height:       Temp (24hrs), Avg:100.5 F (38.1 C), Min:98.5 F (36.9 C), Max:102.7 F (39.3 C)  CBC Latest Ref Rng & Units 04/17/2019 04/15/2019 04/06/2019  WBC 4.0 - 10.5 K/uL 12.2(H) 8.7 5.7  Hemoglobin 12.0 - 15.0 g/dL 8.8(L) 9.2(L) 9.8(L)  Hematocrit 36.0 - 46.0 % 28.7(L) 29.7(L) 32.8(L)  Platelets 150 - 400 K/uL 230 229 213   BMP Latest Ref Rng & Units 04/21/2019 04/20/2019 04/19/2019  Glucose 70 - 99 mg/dL 174(H) 168(H) 180(H)  BUN 8 - 23 mg/dL 44(H) 46(H) 49(H)  Creatinine 0.44 - 1.00 mg/dL 1.96(H) 1.79(H) 1.75(H)  Sodium 135 - 145 mmol/L 134(L) 135 136  Potassium 3.5 - 5.1 mmol/L 4.6 4.5 4.1  Chloride 98 - 111 mmol/L 101 103 102  CO2 22 - 32 mmol/L 20(L) 21(L) 21(L)  Calcium 8.9 - 10.3 mg/dL 9.0 8.8(L) 8.8(L)    Intake/Output Summary (Last 24 hours) at 04/21/2019 1856 Last data filed at 04/21/2019 0500 Gross per 24 hour  Intake 0 ml  Output 1100 ml  Net -1100 ml    Current Facility-Administered Medications:  .  0.9 %  sodium chloride infusion, , Intravenous, PRN, Judith Part, MD, Stopped at 04/16/19 [phone] .  acetaminophen (TYLENOL) tablet 650 mg, 650 mg, Oral, Q4H PRN, 650 mg at 04/21/19 0404 **OR** acetaminophen (TYLENOL) suppository 650 mg, 650 mg, Rectal, Q4H PRN, Judith Part, MD .  acetaZOLAMIDE (DIAMOX) tablet 500 mg, 500 mg, Oral, BID, Judith Part, MD, 500 mg at 04/20/19 2223 .  amLODipine (NORVASC) tablet 10 mg, 10 mg, Oral, Daily, Ostergard, Thomas A, MD, 10 mg at 04/20/19 1037 .  atorvastatin (LIPITOR) tablet 40 mg, 40 mg, Oral, Daily, Judith Part, MD, 40 mg at 04/20/19 1740 .  cephALEXin (KEFLEX) capsule 500  mg, 500 mg, Oral, Q8H, Jerrell Belfast, MD, 500 mg at 04/21/19 0522 .  Chlorhexidine Gluconate Cloth 2 % PADS 6 each, 6 each, Topical, Daily, Judith Part, MD, 6 each at 04/20/19 1039 .  docusate sodium (COLACE) capsule 100 mg, 100 mg, Oral, BID, Judith Part, MD, 100 mg at 04/20/19 1039 .  fentaNYL (SUBLIMAZE) injection 50 mcg, 50 mcg, Intravenous, Once PRN, Judith Part, MD .  gabapentin (NEURONTIN) capsule 300 mg, 300 mg, Oral, TID, Judith Part, MD, 300 mg at 04/20/19 2223 .  glyBURIDE (DIABETA) tablet 5 mg, 5 mg, Oral, BID WC, Ostergard, Joyice Faster, MD, 5 mg at 04/21/19 0804 .  heparin injection 5,000 Units, 5,000 Units, Subcutaneous, Q8H, Judith Part, MD, 5,000 Units at 04/21/19 0524 .  lisinopril (ZESTRIL) tablet 40 mg, 40 mg, Oral, Daily, 40 mg at 04/20/19 1038 **AND** hydrochlorothiazide (HYDRODIURIL) tablet 25 mg, 25 mg, Oral, Daily, Ostergard, Thomas A, MD, 25 mg at 04/20/19 1037 .  HYDROcodone-acetaminophen (NORCO/VICODIN) 5-325 MG per tablet 1 tablet, 1 tablet, Oral, Q4H PRN, Judith Part, MD, 1 tablet at 04/20/19 1400 .  HYDROmorphone (DILAUDID) injection 0.5 mg, 0.5 mg, Intravenous, Q3H PRN, Ostergard, Thomas A, MD .  insulin aspart (novoLOG) injection 0-15 Units, 0-15 Units,  Subcutaneous, TID WC, Judith Part, MD, 2 Units at 04/21/19 [phone]  iron polysaccharides (NIFEREX) capsule 150 mg, 150 mg, Oral, BID, Judith Part, MD, 150 mg at 04/20/19 2224 .  labetalol (NORMODYNE) injection 10-40 mg, 10-40 mg, Intravenous, Q10 min PRN, Judith Part, MD, 10 mg at 04/15/19 1235 .  lidocaine (PF) (XYLOCAINE) 1 % injection, , , PRN, Jacqulynn Cadet, MD, 10 mL at 04/19/19 1436 .  metFORMIN (GLUCOPHAGE) tablet 500 mg, 500 mg, Oral, BID WC, Judith Part, MD, 500 mg at 04/21/19 0807 .  ondansetron (ZOFRAN) tablet 4 mg, 4 mg, Oral, Q4H PRN **OR** ondansetron (ZOFRAN) injection 4 mg, 4 mg, Intravenous, Q4H PRN, Ostergard, Thomas A,  MD .  polyethylene glycol (MIRALAX / GLYCOLAX) packet 17 g, 17 g, Oral, Daily PRN, Judith Part, MD .  promethazine (PHENERGAN) tablet 12.5-25 mg, 12.5-25 mg, Oral, Q4H PRN, Judith Part, MD .  sodium chloride (OCEAN) 0.65 % nasal spray 4 spray, 4 spray, Each Nare, Q1H PRN, Jerrell Belfast, MD .  tiZANidine (ZANAFLEX) tablet 4 mg, 4 mg, Oral, Q8H PRN, Judith Part, MD, 4 mg at 04/20/19 1554   Physical Exam: AOx3, PERRL, EOMI, FS, Strength 5/5 x4, SILTx4, VFF with mild bitemporal hemianopsia No rhinorrhea on exam this morning, LD site c/d/i but no output  Assessment & Plan: 63 y.o. 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 Failed bedside lumbar drain placement. 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 Given that she is not leaking this morning, we can try diamox and see if she can seal the leak. Unfortunately, if lumbar drainage is not an option, she will require an EVD. -daily RFPs while on diamox - BUN improving but slight increase in Cr, will restart IVF -keep drain in place in case it starts to increase output, will d/c drain tomorrow -bedrest with HOB>30 degrees  -if no rhinorrhea, will d/c drain tomorrow. Renal function may keep her in the hospital, otherwise she should be able to be discharged tomorrow afternoon  Judith Part  04/21/19 8:38 AM

## 2019-04-22 LAB — RENAL FUNCTION PANEL
Albumin: 3.1 g/dL — ABNORMAL LOW (ref 3.5–5.0)
Anion gap: 12 (ref 5–15)
BUN: 48 mg/dL — ABNORMAL HIGH (ref 8–23)
CO2: 20 mmol/L — ABNORMAL LOW (ref 22–32)
Calcium: 8.8 mg/dL — ABNORMAL LOW (ref 8.9–10.3)
Chloride: 104 mmol/L (ref 98–111)
Creatinine, Ser: 2.05 mg/dL — ABNORMAL HIGH (ref 0.44–1.00)
GFR calc Af Amer: 30 mL/min — ABNORMAL LOW (ref >60)
GFR calc non Af Amer: 26 mL/min — ABNORMAL LOW (ref >60)
Glucose, Bld: 48 mg/dL — ABNORMAL LOW (ref 70–99)
Phosphorus: 4.4 mg/dL (ref 2.5–4.6)
Potassium: 3.8 mmol/L (ref 3.5–5.1)
Sodium: 136 mmol/L (ref 135–145)

## 2019-04-22 LAB — CULTURE, BLOOD (ROUTINE X 2)
Culture: NO GROWTH
Culture: NO GROWTH
Special Requests: ADEQUATE

## 2019-04-22 LAB — GLUCOSE, CAPILLARY
Glucose-Capillary: 30 mg/dL — CL (ref 70–99)
Glucose-Capillary: 77 mg/dL (ref 70–99)
Glucose-Capillary: 87 mg/dL (ref 70–99)
Glucose-Capillary: 72 mg/dL (ref 70–99)
Glucose-Capillary: 149 mg/dL — ABNORMAL HIGH (ref 70–99)
Glucose-Capillary: 150 mg/dL — ABNORMAL HIGH (ref 70–99)

## 2019-04-22 NOTE — Progress Notes (Signed)
Spoke with patients sister [name] who is very upset that the doctor has not communicated with her. Ms. [name] reported that she has difficulty getting info from pt d/t her falling asleep. Pt is A&O, is answering questions appropriately and has no complaints currently.  Katherina Right RN

## 2019-04-22 NOTE — Progress Notes (Signed)
2030: Pt's sister transferred to my phn while in pt's room. Pt okay'd for RN to speak w/ sister but sister declined stating she wanted Agricultural consultant. Metallurgist is busy. No complaints from pt. Pt denies any issues.

## 2019-04-22 NOTE — Progress Notes (Addendum)
Neurosurgery Service Progress Note  Subjective: No acute events overnight, no rhinorrhea, did have hypoglycemia this morning that was symptomatic, improving now   Objective: Vitals:   04/22/19 0400 04/22/19 0500 04/22/19 0600 04/22/19 0700  BP: (!) 101/59 (!) 102/59 (!) 109/57 112/65  Pulse: 95 92 94 91  Resp: 19 17 15 15   Temp: 98.9 F (37.2 C)     TempSrc: Oral     SpO2: 95% 95% 97% 96%  Weight:      Height:       Temp (24hrs), Avg:99.3 F (37.4 C), Min:98.7 F (37.1 C), Max:99.7 F (37.6 C)  CBC Latest Ref Rng & Units 04/17/2019 04/15/2019 04/06/2019  WBC 4.0 - 10.5 K/uL 12.2(H) 8.7 5.7  Hemoglobin 12.0 - 15.0 g/dL 8.8(L) 9.2(L) 9.8(L)  Hematocrit 36.0 - 46.0 % 28.7(L) 29.7(L) 32.8(L)  Platelets 150 - 400 K/uL 230 229 213   BMP Latest Ref Rng & Units 04/22/2019 04/21/2019 04/20/2019  Glucose 70 - 99 mg/dL 48(L) 174(H) 168(H)  BUN 8 - 23 mg/dL 48(H) 44(H) 46(H)  Creatinine 0.44 - 1.00 mg/dL 2.05(H) 1.96(H) 1.79(H)  Sodium 135 - 145 mmol/L 136 134(L) 135  Potassium 3.5 - 5.1 mmol/L 3.8 4.6 4.5  Chloride 98 - 111 mmol/L 104 101 103  CO2 22 - 32 mmol/L 20(L) 20(L) 21(L)  Calcium 8.9 - 10.3 mg/dL 8.8(L) 9.0 8.8(L)    Intake/Output Summary (Last 24 hours) at 04/22/2019 0836 Last data filed at 04/22/2019 0700 Gross per 24 hour  Intake 815.43 ml  Output 850 ml  Net -34.57 ml    Current Facility-Administered Medications:  .  0.9 %  sodium chloride infusion, , Intravenous, PRN, Judith Part, MD, Last Rate: 75 mL/hr at 04/22/19 0700 .  acetaminophen (TYLENOL) tablet 650 mg, 650 mg, Oral, Q4H PRN, 650 mg at 04/21/19 0404 **OR** acetaminophen (TYLENOL) suppository 650 mg, 650 mg, Rectal, Q4H PRN, Judith Part, MD .  acetaZOLAMIDE (DIAMOX) tablet 500 mg, 500 mg, Oral, BID, Judith Part, MD, 500 mg at 04/21/19 2110 .  amLODipine (NORVASC) tablet 10 mg, 10 mg, Oral, Daily, Judith Part, MD, 10 mg at 04/21/19 0946 .  atorvastatin (LIPITOR) tablet 40 mg, 40 mg,  Oral, Daily, Judith Part, MD, 40 mg at 04/21/19 1605 .  cephALEXin (KEFLEX) capsule 500 mg, 500 mg, Oral, Q8H, Jerrell Belfast, MD, 500 mg at 04/22/19 0602 .  Chlorhexidine Gluconate Cloth 2 % PADS 6 each, 6 each, Topical, Daily, Judith Part, MD, 6 each at 04/21/19 1200 .  docusate sodium (COLACE) capsule 100 mg, 100 mg, Oral, BID, Judith Part, MD, 100 mg at 04/21/19 0946 .  fentaNYL (SUBLIMAZE) injection 50 mcg, 50 mcg, Intravenous, Once PRN, Judith Part, MD .  gabapentin (NEURONTIN) capsule 300 mg, 300 mg, Oral, TID, Judith Part, MD, 300 mg at 04/21/19 2110 .  glyBURIDE (DIABETA) tablet 5 mg, 5 mg, Oral, BID WC, Emmamae Mcnamara A, MD, 5 mg at 04/21/19 1605 .  heparin injection 5,000 Units, 5,000 Units, Subcutaneous, Q8H, Datron Brakebill, Joyice Faster, MD, 5,000 Units at 04/22/19 0602 .  lisinopril (ZESTRIL) tablet 40 mg, 40 mg, Oral, Daily, 40 mg at 04/21/19 0946 **AND** hydrochlorothiazide (HYDRODIURIL) tablet 25 mg, 25 mg, Oral, Daily, Christoph Copelan, Joyice Faster, MD, 25 mg at 04/21/19 0945 .  HYDROcodone-acetaminophen (NORCO/VICODIN) 5-325 MG per tablet 1 tablet, 1 tablet, Oral, Q4H PRN, Judith Part, MD, 1 tablet at 04/20/19 1400 .  HYDROmorphone (DILAUDID) injection 0.5 mg, 0.5 mg, Intravenous, Q3H PRN,  Judith Part, MD .  insulin aspart (novoLOG) injection 0-15 Units, 0-15 Units, Subcutaneous, TID WC, Chelsye Suhre, Joyice Faster, MD, 3 Units at 04/21/19 1157 .  iron polysaccharides (NIFEREX) capsule 150 mg, 150 mg, Oral, BID, Everlean Bucher A, MD, 150 mg at 04/21/19 2110 .  labetalol (NORMODYNE) injection 10-40 mg, 10-40 mg, Intravenous, Q10 min PRN, Judith Part, MD, 10 mg at 04/15/19 1235 .  lidocaine (PF) (XYLOCAINE) 1 % injection, , , PRN, Jacqulynn Cadet, MD, 10 mL at 04/19/19 1436 .  metFORMIN (GLUCOPHAGE) tablet 500 mg, 500 mg, Oral, BID WC, Ralene Gasparyan, Joyice Faster, MD, 500 mg at 04/21/19 1606 .  ondansetron (ZOFRAN) tablet 4 mg, 4 mg, Oral, Q4H  PRN **OR** ondansetron (ZOFRAN) injection 4 mg, 4 mg, Intravenous, Q4H PRN, Shaquill Iseman A, MD .  polyethylene glycol (MIRALAX / GLYCOLAX) packet 17 g, 17 g, Oral, Daily PRN, Judith Part, MD .  promethazine (PHENERGAN) tablet 12.5-25 mg, 12.5-25 mg, Oral, Q4H PRN, Serrena Linderman A, MD .  sodium chloride (OCEAN) 0.65 % nasal spray 4 spray, 4 spray, Each Nare, Q1H PRN, Jerrell Belfast, MD .  tiZANidine (ZANAFLEX) tablet 4 mg, 4 mg, Oral, Q8H PRN, Judith Part, MD, 4 mg at 04/20/19 1554   Physical Exam: AOx3, PERRL, EOMI, FS, Strength 5/5 x4, SILTx4, VFF with mild bitemporal hemianopsia No rhinorrhea on exam this morning, LD site c/d/i   Assessment & Plan: 63 y.o. 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 Failed bedside lumbar drain placement. 8/11 LD placed under fluoro, minimal output  -d/c LD -discharge home today -Cr stable, looks like she bounces around 1.0-2.0 normally, will hold her lisinopril and have pt f/u w/ PCP regarding hypoglycemia and renal function next week. Will have her hold her glyburide dose until restarted by PCP, was low twice overnight  Judith Part  04/22/19 8:36 AM

## 2019-04-22 NOTE — Progress Notes (Signed)
Pt's CBG this morning was 30.  Pt was very drowsy but easily aroused and oriented X 4. Per hypoglycemia protocol, gave patient 8 oz orange juice which she promptly drank. Dr Zada Finders was in room and made aware.  He will assess her oral hypoglycemic medications and adjust if needed Will follow up with CBG in 15 minutes. [name], [name]

## 2019-04-22 NOTE — Discharge Summary (Signed)
Discharge Summary  Date of Admission: 04/15/2019  Date of Discharge: 04/22/19  Attending Physician: Emelda Brothers, MD  Hospital Course: Patient was admitted following an endoscopic endonasal redo transphenoidal resection of a recurrent pituitary adenoma with intraoperative CSF leak. She was recovered in PACU and transferred to 4N. Post-operatively, she did very well, but did start having CSF rhinorrhea. 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 A lumbar drain was unable to be placed at bedside, so one was placed in IR. Despite good placement with fluoroscopic guidance, it still minimally drained after placement. She was started on diamox and had 72 hours without any CSF rhinorrhea. The drain was therefore discontinued and she was discharged. She had a worsening of her renal function while inpatient, so her lisinopril was held. She also had some early morning hypoglycemia, so he glyburide was held. Her hospital course was otherwise uncomplicated and the patient was discharged home on 04/22/2019. She will follow up in clinic with me in 2 weeks. She will also follow up with her PCP regarding re-checking her renal function and restarting her lisinopril and glyburide.  Neurologic exam at discharge:  AOx3, PERRL, EOMI, FS, TM Strength 5/5 x4, SILTx4, no drift  Discharge diagnosis: Pituitary adenoma  Judith Part, MD 04/22/19 8:40 AM

## 2019-04-22 NOTE — Progress Notes (Addendum)
Notified MD of drainage from nose after assisting pt to [facility]. No new orders at this time. Will continue to monitor.   MD called to inform RN that pt will be returning to OR in AM. Will be NPO after midnight. Pt does not need to be bedrest but to keep HOB >30.

## 2019-04-23 ENCOUNTER — Inpatient Hospital Stay (HOSPITAL_COMMUNITY): Payer: BC Managed Care – PPO

## 2019-04-23 LAB — URINALYSIS, ROUTINE W REFLEX MICROSCOPIC
Bacteria, UA: NONE SEEN
Bilirubin Urine: NEGATIVE
Glucose, UA: NEGATIVE mg/dL
Hgb urine dipstick: NEGATIVE
Ketones, ur: NEGATIVE mg/dL
Nitrite: NEGATIVE
Protein, ur: NEGATIVE mg/dL
Specific Gravity, Urine: 1.01 (ref 1.005–1.030)
pH: 5 (ref 5.0–8.0)

## 2019-04-23 LAB — CBC
HCT: 23.3 % — ABNORMAL LOW (ref 36.0–46.0)
Hemoglobin: 7.2 g/dL — ABNORMAL LOW (ref 12.0–15.0)
MCH: 27.2 pg (ref 26.0–34.0)
MCHC: 30.9 g/dL (ref 30.0–36.0)
MCV: 87.9 fL (ref 80.0–100.0)
Platelets: 253 10*3/uL (ref 150–400)
RBC: 2.65 MIL/uL — ABNORMAL LOW (ref 3.87–5.11)
RDW: 14.9 % (ref 11.5–15.5)
WBC: 7.5 10*3/uL (ref 4.0–10.5)
nRBC: 0 % (ref 0.0–0.2)

## 2019-04-23 LAB — BASIC METABOLIC PANEL
Anion gap: 11 (ref 5–15)
BUN: 48 mg/dL — ABNORMAL HIGH (ref 8–23)
CO2: 18 mmol/L — ABNORMAL LOW (ref 22–32)
Calcium: 8.8 mg/dL — ABNORMAL LOW (ref 8.9–10.3)
Chloride: 105 mmol/L (ref 98–111)
Creatinine, Ser: 2.03 mg/dL — ABNORMAL HIGH (ref 0.44–1.00)
GFR calc Af Amer: 30 mL/min — ABNORMAL LOW (ref >60)
GFR calc non Af Amer: 26 mL/min — ABNORMAL LOW (ref >60)
Glucose, Bld: 170 mg/dL — ABNORMAL HIGH (ref 70–99)
Potassium: 4 mmol/L (ref 3.5–5.1)
Sodium: 134 mmol/L — ABNORMAL LOW (ref 135–145)

## 2019-04-23 LAB — RENAL FUNCTION PANEL
Albumin: 3 g/dL — ABNORMAL LOW (ref 3.5–5.0)
Anion gap: 13 (ref 5–15)
BUN: 45 mg/dL — ABNORMAL HIGH (ref 8–23)
CO2: 16 mmol/L — ABNORMAL LOW (ref 22–32)
Calcium: 8.6 mg/dL — ABNORMAL LOW (ref 8.9–10.3)
Chloride: 107 mmol/L (ref 98–111)
Creatinine, Ser: 2.22 mg/dL — ABNORMAL HIGH (ref 0.44–1.00)
GFR calc Af Amer: 27 mL/min — ABNORMAL LOW (ref >60)
GFR calc non Af Amer: 23 mL/min — ABNORMAL LOW (ref >60)
Glucose, Bld: 132 mg/dL — ABNORMAL HIGH (ref 70–99)
Phosphorus: 4.5 mg/dL (ref 2.5–4.6)
Potassium: 4 mmol/L (ref 3.5–5.1)
Sodium: 136 mmol/L (ref 135–145)

## 2019-04-23 LAB — GLUCOSE, CAPILLARY
Glucose-Capillary: 105 mg/dL — ABNORMAL HIGH (ref 70–99)
Glucose-Capillary: 189 mg/dL — ABNORMAL HIGH (ref 70–99)
Glucose-Capillary: 154 mg/dL — ABNORMAL HIGH (ref 70–99)
Glucose-Capillary: 129 mg/dL — ABNORMAL HIGH (ref 70–99)

## 2019-04-23 NOTE — Progress Notes (Signed)
Patient ID: [name], female   DOB: 11/05/1955, 63 y.o.   MRN: [id number] Pt has recurrent csf rhinorrhea this afternoon. Spoke with pt and her brother. Will plan for LD placement in OR tomorrow. Pt agrees to proceed

## 2019-04-23 NOTE — Progress Notes (Signed)
1938: Pt alert and oriented, resting comfortably in bed. Denies HA. No fluid from nose currently. No signs of distress. Seems to be less drowsy than yesterday. Will continue to monitor.   2045: Per pt, okay to speak w/ sister Margaretha Sheffield). Spoke w/ sister and advised of OR schedule and answered questions regarding pre and post procedure.

## 2019-04-23 NOTE — Evaluation (Addendum)
Physical Therapy Evaluation Patient Details Name: [name] MRN: [id number] DOB: 02/05/1956 Today's Date: 04/23/2019   History of Present Illness  Pt is a 63 y.o. F with significant PMH of DM Type 2, L eye cataract, glaucoma, breast cancer who presents for endoscopic endonasal redo transphenoidal resection of a recurrent pituitary adenoma with intraoperative CSF leak operatively.  Clinical Impression  Pt s/p procedure above. Pt presents with decreased cognition and weakness. Requiring min assist for bed mobility. Once sitting upright, pt with serous drainage from left nostril; denied headache or dizziness. Returned pt to supine and notified RN/MD. Pt is confused with decreased memory recall, awareness, attention. Will attempt further evaluation when medically appropriate.     Follow Up Recommendations Home health PT;Supervision/Assistance - 24 hour (pending progress)    Equipment Recommendations  Rolling walker with 5" wheels    Recommendations for Other Services OT consult     Precautions / Restrictions Precautions Precautions: Fall Restrictions Weight Bearing Restrictions: No      Mobility  Bed Mobility Overal bed mobility: Needs Assistance Bed Mobility: Sit to Supine;Supine to Sit     Supine to sit: Min assist Sit to supine: Min guard   General bed mobility comments: Increased time to achieve upright, difficulty sequencing. Min assist for facilitation  Transfers                 General transfer comment: deferred  Ambulation/Gait                Stairs            Wheelchair Mobility    Modified Rankin (Stroke Patients Only)       Balance Overall balance assessment: Needs assistance Sitting-balance support: Bilateral upper extremity supported;Feet supported Sitting balance-Leahy Scale: Fair                                       Pertinent Vitals/Pain Pain Assessment: No/denies pain    Home Living Family/patient  expects to be discharged to:: Private residence Living Arrangements: Alone Available Help at Discharge: Family;Available PRN/intermittently Type of Home: House Home Access: Level entry     Home Layout: One level Home Equipment: None      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     Hand Dominance        Extremity/Trunk Assessment   Upper Extremity Assessment Upper Extremity Assessment: Generalized weakness    Lower Extremity Assessment Lower Extremity Assessment: Generalized weakness       Communication   Communication: No difficulties  Cognition Arousal/Alertness: Awake/alert Behavior During Therapy: WFL for tasks assessed/performed Overall Cognitive Status: Impaired/Different from baseline Area of Impairment: Attention;Memory;Safety/judgement;Awareness;Problem solving                   Current Attention Level: Sustained Memory: Decreased short-term memory   Safety/Judgement: Decreased awareness of safety;Decreased awareness of deficits Awareness: Intellectual Problem Solving: Slow processing;Decreased initiation;Requires verbal cues;Difficulty sequencing;Requires tactile cues General Comments: Pt with waxing and waning attention span, tends to use humor to cover up deficits. 0/3 short term memory recall. Responds to questions appropriately during moment. Pt masking deficits, have to ask specifically to gauge symptoms      General Comments      Exercises     Assessment/Plan    PT Assessment Patient needs continued PT services  PT Problem List Decreased strength;Decreased activity tolerance;Decreased balance;Decreased mobility;Decreased cognition;Decreased safety awareness       PT Treatment Interventions Gait training;DME instruction;Stair training;Functional mobility training;Therapeutic activities;Therapeutic exercise;Balance training;Patient/family education    PT Goals (Current goals  can be found in the Care Plan section)  Acute Rehab PT Goals Patient Stated Goal: "get some rest." PT Goal Formulation: With patient Time For Goal Achievement: 05/07/19 Potential to Achieve Goals: Good    Frequency Min 3X/week   Barriers to discharge        Co-evaluation               AM-PAC PT "6 Clicks" Mobility  Outcome Measure Help needed turning from your back to your side while in a flat bed without using bedrails?: None Help needed moving from lying on your back to sitting on the side of a flat bed without using bedrails?: A Little Help needed moving to and from a bed to a chair (including a wheelchair)?: A Little Help needed standing up from a chair using your arms (e.g., wheelchair or bedside chair)?: A Little Help needed to walk in hospital room?: A Little Help needed climbing 3-5 steps with a railing? : A Lot 6 Click Score: 18    End of Session   Activity Tolerance: Treatment limited secondary to medical complications (Comment) Patient left: in bed;with call bell/phone within reach;with family/visitor present Nurse Communication: Mobility status PT Visit Diagnosis: Muscle weakness (generalized) (M62.81)    Time: 0223-3612 PT Time Calculation (min) (ACUTE ONLY): 14 min   Charges:   PT Evaluation $PT Eval Moderate Complexity: 1 Mod          Ellamae Sia, PT, DPT Acute Rehabilitation Services Pager [phone] Office [phone]   Willy Eddy 04/23/2019, 4:58 PM

## 2019-04-23 NOTE — Progress Notes (Addendum)
PT alerted RN that after sitting pt on side of the bed, pt began to drip fluid from L nostril. Notified MD of drainage. MD at bedside while RN was assisting pt to [facility], in which pt began to have dripping from L nostril again. RN spoke to MD about concerns of mental status, in which pt is not able to recall the conversation from this morning in regards to having a lumbar drain placed. Also informed MD of pt complaining of HAs. New orders received. Will follow through and continue to monitor.

## 2019-04-23 NOTE — Progress Notes (Signed)
Patient ID: [name], female   DOB: July 31, 1956, 63 y.o.   MRN: [id number] I had her on the schedule for replacement of the lumbar drain today, but at this point I cannot get her to leak from her nose.  I sat her on the side of the bed and the nurse walked her around and for now she is not leaking.  She does not notice a postnasal drip nor is there any dripping from her nose.  We will continue to monitor through the weekend here.

## 2019-04-24 ENCOUNTER — Inpatient Hospital Stay (HOSPITAL_COMMUNITY): Payer: BC Managed Care – PPO

## 2019-04-24 ENCOUNTER — Encounter (HOSPITAL_COMMUNITY): Payer: Self-pay | Admitting: Certified Registered Nurse Anesthetist

## 2019-04-24 ENCOUNTER — Encounter (HOSPITAL_COMMUNITY)
Admission: RE | Disposition: A | Discharge status: Rehabilitation Facility | Payer: Self-pay | Source: Home / Self Care | Attending: Neurological Surgery

## 2019-04-24 ENCOUNTER — Inpatient Hospital Stay (HOSPITAL_COMMUNITY): Payer: BC Managed Care – PPO | Admitting: Certified Registered"

## 2019-04-24 DIAGNOSIS — G9600 Cerebrospinal fluid leak, unspecified: Secondary | ICD-10-CM | POA: Diagnosis present

## 2019-04-24 HISTORY — PX: PLACEMENT OF LUMBAR DRAIN: SHX6028

## 2019-04-24 LAB — CSF CELL COUNT WITH DIFFERENTIAL
Lymphs, CSF: 30 % — ABNORMAL LOW (ref 40–80)
Monocyte-Macrophage-Spinal Fluid: 16 % (ref 15–45)
RBC Count, CSF: 435 /mm3 — ABNORMAL HIGH
Segmented Neutrophils-CSF: 54 % — ABNORMAL HIGH (ref 0–6)
WBC, CSF: 47 /mm3 (ref 0–5)

## 2019-04-24 LAB — RENAL FUNCTION PANEL
Albumin: 2.9 g/dL — ABNORMAL LOW (ref 3.5–5.0)
Anion gap: 12 (ref 5–15)
BUN: 48 mg/dL — ABNORMAL HIGH (ref 8–23)
CO2: 17 mmol/L — ABNORMAL LOW (ref 22–32)
Calcium: 8.8 mg/dL — ABNORMAL LOW (ref 8.9–10.3)
Chloride: 105 mmol/L (ref 98–111)
Creatinine, Ser: 1.91 mg/dL — ABNORMAL HIGH (ref 0.44–1.00)
GFR calc Af Amer: 32 mL/min — ABNORMAL LOW (ref >60)
GFR calc non Af Amer: 28 mL/min — ABNORMAL LOW (ref >60)
Glucose, Bld: 107 mg/dL — ABNORMAL HIGH (ref 70–99)
Phosphorus: 4.8 mg/dL — ABNORMAL HIGH (ref 2.5–4.6)
Potassium: 4.1 mmol/L (ref 3.5–5.1)
Sodium: 134 mmol/L — ABNORMAL LOW (ref 135–145)

## 2019-04-24 LAB — GLUCOSE, CAPILLARY
Glucose-Capillary: 96 mg/dL (ref 70–99)
Glucose-Capillary: 112 mg/dL — ABNORMAL HIGH (ref 70–99)
Glucose-Capillary: 163 mg/dL — ABNORMAL HIGH (ref 70–99)
Glucose-Capillary: 331 mg/dL — ABNORMAL HIGH (ref 70–99)

## 2019-04-24 LAB — PROTEIN AND GLUCOSE, CSF
Glucose, CSF: 66 mg/dL (ref 40–70)
Total  Protein, CSF: 38 mg/dL (ref 15–45)

## 2019-04-24 SURGERY — PLACEMENT OF LUMBAR DRAIN
Anesthesia: General | Site: Back

## 2019-04-24 MED ORDER — PHENYLEPHRINE 40 MCG/ML (10ML) SYRINGE FOR IV PUSH (FOR BLOOD PRESSURE SUPPORT)
PREFILLED_SYRINGE | INTRAVENOUS | Status: AC
  Filled 2019-04-24: qty 10

## 2019-04-24 MED ORDER — BUPIVACAINE HCL (PF) 0.25 % IJ SOLN
INTRAMUSCULAR | Status: AC
  Filled 2019-04-24: qty 30

## 2019-04-24 MED ORDER — LIDOCAINE 2% (20 MG/ML) 5 ML SYRINGE
INTRAMUSCULAR | Status: DC | PRN
  Administered 2019-04-24: 100 mg via INTRAVENOUS

## 2019-04-24 MED ORDER — ONDANSETRON HCL 4 MG/2ML IJ SOLN
INTRAMUSCULAR | Status: DC | PRN
  Administered 2019-04-24: 4 mg via INTRAVENOUS

## 2019-04-24 MED ORDER — SODIUM CHLORIDE 0.9 % IV SOLN
INTRAVENOUS | Status: DC | PRN
  Administered 2019-04-24: 20 ug/min via INTRAVENOUS

## 2019-04-24 MED ORDER — ACETAMINOPHEN 325 MG PO TABS
650.0000 mg | ORAL_TABLET | ORAL | Status: DC | PRN
  Administered 2019-04-24 – 2019-05-24 (×28): 650 mg via ORAL
  Filled 2019-04-24 (×27): qty 2

## 2019-04-24 MED ORDER — FENTANYL CITRATE (PF) 100 MCG/2ML IJ SOLN
INTRAMUSCULAR | Status: DC | PRN
  Administered 2019-04-24: 150 ug via INTRAVENOUS

## 2019-04-24 MED ORDER — SODIUM CHLORIDE 0.9% FLUSH: 3.0000 mL | INTRAVENOUS | Status: DC | PRN

## 2019-04-24 MED ORDER — ROCURONIUM BROMIDE 100 MG/10ML IV SOLN
INTRAVENOUS | Status: DC | PRN
  Administered 2019-04-24: 70 mg via INTRAVENOUS

## 2019-04-24 MED ORDER — SUGAMMADEX SODIUM 200 MG/2ML IV SOLN
INTRAVENOUS | Status: DC | PRN
  Administered 2019-04-24: 425 mg via INTRAVENOUS

## 2019-04-24 MED ORDER — ONDANSETRON HCL 4 MG/2ML IJ SOLN
INTRAMUSCULAR | Status: AC
  Filled 2019-04-24: qty 2

## 2019-04-24 MED ORDER — ONDANSETRON HCL 4 MG/2ML IJ SOLN
4.0000 mg | Freq: Four times a day (QID) | INTRAMUSCULAR | Status: DC | PRN
  Administered 2019-05-05: 4 mg via INTRAVENOUS

## 2019-04-24 MED ORDER — ONDANSETRON HCL 4 MG PO TABS
4.0000 mg | ORAL_TABLET | Freq: Four times a day (QID) | ORAL | Status: DC | PRN
  Filled 2019-04-24: qty 1

## 2019-04-24 MED ORDER — LIDOCAINE 2% (20 MG/ML) 5 ML SYRINGE
INTRAMUSCULAR | Status: AC
  Filled 2019-04-24: qty 5

## 2019-04-24 MED ORDER — MIDAZOLAM HCL 2 MG/2ML IJ SOLN
INTRAMUSCULAR | Status: AC
  Filled 2019-04-24: qty 2

## 2019-04-24 MED ORDER — POTASSIUM CHLORIDE IN NACL 20-0.9 MEQ/L-% IV SOLN
INTRAVENOUS | Status: DC
  Administered 2019-04-24 – 2019-04-29 (×5): via INTRAVENOUS
  Filled 2019-04-24 (×6): qty 1000

## 2019-04-24 MED ORDER — PROPOFOL 10 MG/ML IV BOLUS
INTRAVENOUS | Status: DC | PRN
  Administered 2019-04-24: 120 mg via INTRAVENOUS

## 2019-04-24 MED ORDER — LACTATED RINGERS IV SOLN
INTRAVENOUS | Status: DC
  Administered 2019-04-24: 09:00:00 via INTRAVENOUS

## 2019-04-24 MED ORDER — ACETAMINOPHEN 650 MG RE SUPP
650.0000 mg | RECTAL | Status: DC | PRN
  Administered 2019-05-15 – 2019-05-16 (×3): 650 mg via RECTAL
  Filled 2019-04-24 (×5): qty 1

## 2019-04-24 MED ORDER — SODIUM CHLORIDE 0.9% FLUSH
3.0000 mL | Freq: Two times a day (BID) | INTRAVENOUS | Status: DC
  Administered 2019-04-25 – 2019-05-01 (×11): 3 mL via INTRAVENOUS

## 2019-04-24 MED ORDER — ROCURONIUM BROMIDE 10 MG/ML (PF) SYRINGE
PREFILLED_SYRINGE | INTRAVENOUS | Status: AC
  Filled 2019-04-24: qty 10

## 2019-04-24 MED ORDER — DEXAMETHASONE SODIUM PHOSPHATE 10 MG/ML IJ SOLN
INTRAMUSCULAR | Status: AC
  Filled 2019-04-24: qty 1

## 2019-04-24 MED ORDER — SENNA 8.6 MG PO TABS
1.0000 | ORAL_TABLET | Freq: Two times a day (BID) | ORAL | Status: DC
  Administered 2019-04-25 – 2019-06-11 (×78): 8.6 mg via ORAL
  Filled 2019-04-24 (×81): qty 1

## 2019-04-24 MED ORDER — FENTANYL CITRATE (PF) 250 MCG/5ML IJ SOLN
INTRAMUSCULAR | Status: AC
  Filled 2019-04-24: qty 5

## 2019-04-24 MED ORDER — SODIUM CHLORIDE 0.9 % IV SOLN
250.0000 mL | INTRAVENOUS | Status: DC
  Administered 2019-04-29: 16:00:00 via INTRAVENOUS

## 2019-04-24 MED ORDER — SUGAMMADEX SODIUM 500 MG/5ML IV SOLN
INTRAVENOUS | Status: AC
  Filled 2019-04-24: qty 5

## 2019-04-24 MED ORDER — DEXAMETHASONE SODIUM PHOSPHATE 10 MG/ML IJ SOLN
INTRAMUSCULAR | Status: DC | PRN
  Administered 2019-04-24: 10 mg via INTRAVENOUS

## 2019-04-24 MED ORDER — PHENYLEPHRINE 40 MCG/ML (10ML) SYRINGE FOR IV PUSH (FOR BLOOD PRESSURE SUPPORT)
PREFILLED_SYRINGE | INTRAVENOUS | Status: DC | PRN
  Administered 2019-04-24: 80 ug via INTRAVENOUS
  Administered 2019-04-24: 120 ug via INTRAVENOUS
  Administered 2019-04-24: 80 ug via INTRAVENOUS

## 2019-04-24 MED ORDER — PROPOFOL 10 MG/ML IV BOLUS
INTRAVENOUS | Status: AC
  Filled 2019-04-24: qty 20

## 2019-04-24 MED ORDER — MIDAZOLAM HCL 5 MG/5ML IJ SOLN
INTRAMUSCULAR | Status: DC | PRN
  Administered 2019-04-24 (×2): 1 mg via INTRAVENOUS

## 2019-04-24 SURGICAL SUPPLY — 36 items
CONT SPEC 4OZ CLIKSEAL STRL BL (MISCELLANEOUS) ×6 IMPLANT
CONT SPEC STER OR (MISCELLANEOUS) ×9 IMPLANT
COVER BACK TABLE 60X90IN (DRAPES) ×3 IMPLANT
COVER MAYO STAND STRL (DRAPES) ×3 IMPLANT
COVER WAND RF STERILE (DRAPES) ×3 IMPLANT
DRAIN SUBARACHNOID (WOUND CARE) ×3 IMPLANT
DRAPE C-ARM 42X72 X-RAY (DRAPES) ×6 IMPLANT
DRAPE INCISE IOBAN 66X45 STRL (DRAPES) ×3 IMPLANT
DRAPE LAPAROTOMY 100X72X124 (DRAPES) ×3 IMPLANT
DURAPREP 26ML APPLICATOR (WOUND CARE) ×3 IMPLANT
GAUZE 4X4 16PLY RFD (DISPOSABLE) ×3 IMPLANT
GAUZE SPONGE 4X4 16PLY XRAY LF (GAUZE/BANDAGES/DRESSINGS) ×3 IMPLANT
GLOVE BIO SURGEON STRL SZ7 (GLOVE) ×6 IMPLANT
GLOVE BIO SURGEON STRL SZ8 (GLOVE) ×3 IMPLANT
GLOVE BIOGEL PI IND STRL 7.0 (GLOVE) ×1 IMPLANT
GLOVE BIOGEL PI IND STRL 7.5 (GLOVE) ×2 IMPLANT
GLOVE BIOGEL PI INDICATOR 7.0 (GLOVE) ×2
GLOVE BIOGEL PI INDICATOR 7.5 (GLOVE) ×4
GLOVE EXAM NITRILE XL STR (GLOVE) IMPLANT
GOWN STRL REUS W/ TWL LRG LVL3 (GOWN DISPOSABLE) ×1 IMPLANT
GOWN STRL REUS W/ TWL XL LVL3 (GOWN DISPOSABLE) ×3 IMPLANT
GOWN STRL REUS W/TWL 2XL LVL3 (GOWN DISPOSABLE) IMPLANT
GOWN STRL REUS W/TWL LRG LVL3 (GOWN DISPOSABLE) ×2
GOWN STRL REUS W/TWL XL LVL3 (GOWN DISPOSABLE) ×6
KIT BASIN OR (CUSTOM PROCEDURE TRAY) ×3 IMPLANT
KIT DRAIN CSF ACCUDRAIN (MISCELLANEOUS) ×3 IMPLANT
KIT TURNOVER KIT B (KITS) ×3 IMPLANT
MARKER SKIN DUAL TIP RULER LAB (MISCELLANEOUS) ×3 IMPLANT
NEEDLE HYPO 22GX1.5 SAFETY (NEEDLE) ×3 IMPLANT
PAD ARMBOARD 7.5X6 YLW CONV (MISCELLANEOUS) ×3 IMPLANT
SUT ETHILON 3 0 FSL (SUTURE) ×3 IMPLANT
SUT VIC AB 2-0 CP2 18 (SUTURE) ×3 IMPLANT
SYR 20ML ECCENTRIC (SYRINGE) ×3 IMPLANT
SYR CONTROL 10ML LL (SYRINGE) ×6 IMPLANT
TOWEL GREEN STERILE (TOWEL DISPOSABLE) ×3 IMPLANT
TOWEL GREEN STERILE FF (TOWEL DISPOSABLE) ×3 IMPLANT

## 2019-04-24 NOTE — Op Note (Signed)
04/24/2019  10:22 AM  PATIENT:  [name]  63 y.o. female  PRE-OPERATIVE DIAGNOSIS: CSF rhinorrhea  POST-OPERATIVE DIAGNOSIS:  same  PROCEDURE: Lumbar drain placement  SURGEON:  Sherley Bounds, MD  ASSISTANTS: Meyran FNP  ANESTHESIA:   General  EBL: None ml  Total I/O In: 0  Out: 650 [Urine:650]  BLOOD ADMINISTERED: none  DRAINS: Lumbar drain  SPECIMEN:  none  INDICATION FOR PROCEDURE: This patient presented with CSF rhinorrhea after redo transsphenoidal surgery. Recommended lumbar drain placement. Patient understood the risks, benefits, and alternatives and potential outcomes and wished to proceed.  PROCEDURE DETAILS: The patient was taken to the operating room and after induction of adequate generalized endotracheal anesthesia, the patient was rolled into the prone position on the Wilson frame and all pressure points were padded.  We marked our entry site with AP fluoroscopy at the L2-3 and L3-4 level.  The lumbar region was cleaned and then prepped with DuraPrep and draped in the usual sterile fashion. 5 cc of local anesthesia was injected and then used the lumbar needle and inserted it and slowly advanced through the interspinous ligament at L2-3 until we felt a pop as we entered the epidural space.  The CSF flow was under very low pressure and we had to use a syringe to draw back and make sure we were in the CSF space.  We also checked this with AP and lateral fluoroscopy.  Once we got a good flow of CSF we placed our lumbar drain with the guidewire through the needle and then remove the needle and the guidewire.  We then got good flow of spinal fluid and therefore attached lumbar drain to the distal reservoir.  We then sewed the lumbar drain in place with 2-0 Vicryl.  We then placed an occlusive dressing over the lumbar drain.  We then placed tape over the occlusive dressing.. The drapes were removed The patient was awakened from general anesthesia and transferred to the  recovery room in stable condition. At the end of the procedure all sponge, needle and instrument counts were correct.    PLAN OF CARE: Admit to inpatient   PATIENT DISPOSITION:  ICU - extubated and stable.   Delay start of Pharmacological VTE agent (>24hrs) due to surgical blood loss or risk of bleeding:  yes

## 2019-04-24 NOTE — Progress Notes (Signed)
Pt drain not putting out anything since 1300. WBC in CSF was 47. Vinny PA notified. Orders given to continue to monitor drain and monitor pt for ams or rhinorrhea.

## 2019-04-24 NOTE — Anesthesia Procedure Notes (Signed)
Procedure Name: Intubation Date/Time: 04/24/2019 9:36 AM Performed by: Candis Shine, CRNA Pre-anesthesia Checklist: Patient identified, Emergency Drugs available, Suction available and Patient being monitored Patient Re-evaluated:Patient Re-evaluated prior to induction Oxygen Delivery Method: Circle System Utilized Preoxygenation: Pre-oxygenation with 100% oxygen Induction Type: IV induction Ventilation: Mask ventilation without difficulty and Oral airway inserted - appropriate to patient size Laryngoscope Size: Mac and 3 Grade View: Grade I Tube type: Oral Tube size: 7.0 mm Number of attempts: 1 Airway Equipment and Method: Stylet and Oral airway Placement Confirmation: ETT inserted through vocal cords under direct vision,  positive ETCO2 and breath sounds checked- equal and bilateral Secured at: 22 cm Tube secured with: Tape Dental Injury: Teeth and Oropharynx as per pre-operative assessment

## 2019-04-24 NOTE — Anesthesia Postprocedure Evaluation (Signed)
Anesthesia Post Note  Patient: [name]  Procedure(s) Performed: PLACEMENT OF LUMBAR DRAIN (N/A Back)     Patient location during evaluation: PACU Anesthesia Type: General Level of consciousness: awake and alert Pain management: pain level controlled Vital Signs Assessment: post-procedure vital signs reviewed and stable Respiratory status: spontaneous breathing, nonlabored ventilation, respiratory function stable and patient connected to nasal cannula oxygen Cardiovascular status: blood pressure returned to baseline and stable Postop Assessment: no apparent nausea or vomiting Anesthetic complications: no    Last Vitals:  Vitals:   04/24/19 1300 04/24/19 1400  BP: 139/68 118/63  Pulse: 98 97  Resp: 19 15  Temp:    SpO2: 98% 98%    Last Pain:  Vitals:   04/24/19 1200  TempSrc:   PainSc: 0-No pain                 Darionna Banke DAVID

## 2019-04-24 NOTE — Transfer of Care (Signed)
Immediate Anesthesia Transfer of Care Note  Patient: [name]  Procedure(s) Performed: PLACEMENT OF LUMBAR DRAIN (N/A Back)  Patient Location: PACU  Anesthesia Type:General  Level of Consciousness: awake and alert   Airway & Oxygen Therapy: Patient Spontanous Breathing and Patient connected to face mask oxygen  Post-op Assessment: Report given to RN and Post -op Vital signs reviewed and stable  Post vital signs: Reviewed and stable  Last Vitals:  Vitals Value Taken Time  BP 109/51 04/24/19 1024  Temp    Pulse    Resp 20 04/24/19 1027  SpO2    Vitals shown include unvalidated device data.  Last Pain:  Vitals:   04/24/19 0853  TempSrc:   PainSc: 0-No pain      Patients Stated Pain Goal: 4 (76/28/31 5176)  Complications: No apparent anesthesia complications

## 2019-04-24 NOTE — Anesthesia Preprocedure Evaluation (Signed)
Anesthesia Evaluation  Patient identified by MRN, date of birth, ID band Patient awake    Reviewed: Allergy & Precautions, NPO status , Patient's Chart, lab work & pertinent test results  Airway Mallampati: I  TM Distance: >3 FB Neck ROM: Full    Dental   Pulmonary former smoker,    Pulmonary exam normal        Cardiovascular hypertension, Pt. on medications Normal cardiovascular exam     Neuro/Psych    GI/Hepatic   Endo/Other  diabetes, Type 2, Oral Hypoglycemic Agents  Renal/GU Renal InsufficiencyRenal disease     Musculoskeletal   Abdominal   Peds  Hematology   Anesthesia Other Findings   Reproductive/Obstetrics                             Anesthesia Physical Anesthesia Plan  ASA: III  Anesthesia Plan: General   Post-op Pain Management:    Induction: Intravenous  PONV Risk Score and Plan: 3 and Ondansetron and Midazolam  Airway Management Planned: Oral ETT  Additional Equipment:   Intra-op Plan:   Post-operative Plan: Extubation in OR  Informed Consent: I have reviewed the patients History and Physical, chart, labs and discussed the procedure including the risks, benefits and alternatives for the proposed anesthesia with the patient or authorized representative who has indicated his/her understanding and acceptance.       Plan Discussed with: CRNA and Surgeon  Anesthesia Plan Comments:         Anesthesia Quick Evaluation

## 2019-04-25 ENCOUNTER — Encounter (HOSPITAL_COMMUNITY): Payer: Self-pay | Admitting: Neurological Surgery

## 2019-04-25 LAB — GLUCOSE, CAPILLARY
Glucose-Capillary: 153 mg/dL — ABNORMAL HIGH (ref 70–99)
Glucose-Capillary: 210 mg/dL — ABNORMAL HIGH (ref 70–99)
Glucose-Capillary: 282 mg/dL — ABNORMAL HIGH (ref 70–99)
Glucose-Capillary: 308 mg/dL — ABNORMAL HIGH (ref 70–99)
Glucose-Capillary: 345 mg/dL — ABNORMAL HIGH (ref 70–99)

## 2019-04-25 LAB — SODIUM, URINE, RANDOM: Sodium, Ur: 32 mmol/L

## 2019-04-25 LAB — RENAL FUNCTION PANEL
Albumin: 2.8 g/dL — ABNORMAL LOW (ref 3.5–5.0)
Anion gap: 11 (ref 5–15)
BUN: 47 mg/dL — ABNORMAL HIGH (ref 8–23)
CO2: 14 mmol/L — ABNORMAL LOW (ref 22–32)
Calcium: 8.5 mg/dL — ABNORMAL LOW (ref 8.9–10.3)
Chloride: 107 mmol/L (ref 98–111)
Creatinine, Ser: 1.79 mg/dL — ABNORMAL HIGH (ref 0.44–1.00)
GFR calc Af Amer: 35 mL/min — ABNORMAL LOW (ref >60)
GFR calc non Af Amer: 30 mL/min — ABNORMAL LOW (ref >60)
Glucose, Bld: 314 mg/dL — ABNORMAL HIGH (ref 70–99)
Phosphorus: 3.4 mg/dL (ref 2.5–4.6)
Potassium: 5 mmol/L (ref 3.5–5.1)
Sodium: 132 mmol/L — ABNORMAL LOW (ref 135–145)

## 2019-04-25 LAB — PATHOLOGIST SMEAR REVIEW

## 2019-04-25 LAB — OSMOLALITY, URINE: Osmolality, Ur: 226 mOsm/kg — ABNORMAL LOW (ref 300–900)

## 2019-04-25 MED ORDER — INSULIN ASPART 100 UNIT/ML ~~LOC~~ SOLN
0.0000 [IU] | Freq: Three times a day (TID) | SUBCUTANEOUS | Status: DC
  Administered 2019-04-25: 7 [IU] via SUBCUTANEOUS
  Administered 2019-04-26 – 2019-04-27 (×3): 4 [IU] via SUBCUTANEOUS
  Administered 2019-04-27 – 2019-04-29 (×6): 3 [IU] via SUBCUTANEOUS
  Administered 2019-04-30: 4 [IU] via SUBCUTANEOUS
  Administered 2019-04-30 – 2019-05-01 (×3): 3 [IU] via SUBCUTANEOUS
  Administered 2019-05-01: 4 [IU] via SUBCUTANEOUS
  Administered 2019-05-02: 7 [IU] via SUBCUTANEOUS
  Administered 2019-05-02 – 2019-05-03 (×2): 3 [IU] via SUBCUTANEOUS
  Administered 2019-05-03 – 2019-05-04 (×2): 4 [IU] via SUBCUTANEOUS
  Administered 2019-05-04: 3 [IU] via SUBCUTANEOUS
  Administered 2019-05-04 – 2019-05-05 (×2): 4 [IU] via SUBCUTANEOUS
  Administered 2019-05-05: 7 [IU] via SUBCUTANEOUS
  Administered 2019-05-05: 4 [IU] via SUBCUTANEOUS
  Administered 2019-05-06 (×3): 3 [IU] via SUBCUTANEOUS
  Administered 2019-05-07: 4 [IU] via SUBCUTANEOUS
  Administered 2019-05-07: 7 [IU] via SUBCUTANEOUS
  Administered 2019-05-08: 3 [IU] via SUBCUTANEOUS
  Administered 2019-05-08: 4 [IU] via SUBCUTANEOUS
  Administered 2019-05-08 – 2019-05-09 (×2): 7 [IU] via SUBCUTANEOUS
  Administered 2019-05-09: 4 [IU] via SUBCUTANEOUS
  Administered 2019-05-09: 7 [IU] via SUBCUTANEOUS
  Administered 2019-05-10 (×3): 4 [IU] via SUBCUTANEOUS
  Administered 2019-05-11: 1 [IU] via SUBCUTANEOUS
  Administered 2019-05-11: 4 [IU] via SUBCUTANEOUS
  Administered 2019-05-11: 7 [IU] via SUBCUTANEOUS
  Administered 2019-05-12: 4 [IU] via SUBCUTANEOUS
  Administered 2019-05-12: 08:00:00 3 [IU] via SUBCUTANEOUS
  Administered 2019-05-12: 13:00:00 4 [IU] via SUBCUTANEOUS
  Administered 2019-05-13: 7 [IU] via SUBCUTANEOUS
  Administered 2019-05-13: 3 [IU] via SUBCUTANEOUS
  Administered 2019-05-13 – 2019-05-14 (×4): 4 [IU] via SUBCUTANEOUS
  Administered 2019-05-15: 3 [IU] via SUBCUTANEOUS
  Administered 2019-05-15: 4 [IU] via SUBCUTANEOUS
  Administered 2019-05-15: 3 [IU] via SUBCUTANEOUS
  Administered 2019-05-16 – 2019-05-17 (×3): 4 [IU] via SUBCUTANEOUS
  Administered 2019-05-17: 7 [IU] via SUBCUTANEOUS
  Administered 2019-05-17: 3 [IU] via SUBCUTANEOUS
  Administered 2019-05-18: 2 [IU] via SUBCUTANEOUS
  Administered 2019-05-18: 3 [IU] via SUBCUTANEOUS
  Administered 2019-05-18: 4 [IU] via SUBCUTANEOUS
  Administered 2019-05-19: 3 [IU] via SUBCUTANEOUS
  Administered 2019-05-19 – 2019-05-20 (×3): 4 [IU] via SUBCUTANEOUS
  Administered 2019-05-20 – 2019-05-22 (×5): 3 [IU] via SUBCUTANEOUS
  Administered 2019-05-23: 4 [IU] via SUBCUTANEOUS
  Administered 2019-05-24: 08:00:00 3 [IU] via SUBCUTANEOUS
  Administered 2019-05-24 (×2): 4 [IU] via SUBCUTANEOUS
  Administered 2019-05-25 – 2019-05-27 (×5): 3 [IU] via SUBCUTANEOUS
  Administered 2019-05-28: 4 [IU] via SUBCUTANEOUS
  Administered 2019-05-28 – 2019-05-30 (×5): 3 [IU] via SUBCUTANEOUS
  Administered 2019-05-30: 4 [IU] via SUBCUTANEOUS
  Administered 2019-05-31 (×2): 3 [IU] via SUBCUTANEOUS
  Administered 2019-06-01: 4 [IU] via SUBCUTANEOUS
  Administered 2019-06-01 (×2): 7 [IU] via SUBCUTANEOUS
  Administered 2019-06-02: 4 [IU] via SUBCUTANEOUS
  Administered 2019-06-02: 7 [IU] via SUBCUTANEOUS
  Administered 2019-06-02 – 2019-06-03 (×3): 11 [IU] via SUBCUTANEOUS
  Administered 2019-06-03: 7 [IU] via SUBCUTANEOUS
  Administered 2019-06-04 (×2): 11 [IU] via SUBCUTANEOUS
  Administered 2019-06-04: 09:00:00 7 [IU] via SUBCUTANEOUS
  Administered 2019-06-05: 3 [IU] via SUBCUTANEOUS
  Administered 2019-06-05: 4 [IU] via SUBCUTANEOUS
  Administered 2019-06-05 – 2019-06-06 (×4): 7 [IU] via SUBCUTANEOUS
  Administered 2019-06-07 (×3): 4 [IU] via SUBCUTANEOUS
  Administered 2019-06-08: 12:00:00 15 [IU] via SUBCUTANEOUS
  Administered 2019-06-08: 7 [IU] via SUBCUTANEOUS
  Administered 2019-06-08: 4 [IU] via SUBCUTANEOUS
  Administered 2019-06-09: 13:00:00 7 [IU] via SUBCUTANEOUS
  Administered 2019-06-09: 4 [IU] via SUBCUTANEOUS
  Administered 2019-06-09: 11 [IU] via SUBCUTANEOUS
  Administered 2019-06-10: 18:00:00 7 [IU] via SUBCUTANEOUS
  Administered 2019-06-10: 09:00:00 4 [IU] via SUBCUTANEOUS
  Administered 2019-06-10: 11 [IU] via SUBCUTANEOUS
  Administered 2019-06-11: 10:00:00 4 [IU] via SUBCUTANEOUS
  Administered 2019-06-11: 11 [IU] via SUBCUTANEOUS

## 2019-04-25 MED ORDER — INSULIN ASPART 100 UNIT/ML ~~LOC~~ SOLN
0.0000 [IU] | Freq: Every day | SUBCUTANEOUS | Status: DC
  Administered 2019-04-29: 3 [IU] via SUBCUTANEOUS
  Administered 2019-05-16: 2 [IU] via SUBCUTANEOUS
  Administered 2019-06-02: 3 [IU] via SUBCUTANEOUS
  Administered 2019-06-03 – 2019-06-08 (×2): 2 [IU] via SUBCUTANEOUS

## 2019-04-25 NOTE — Progress Notes (Signed)
Inpatient Diabetes Program Recommendations  AACE/ADA: New Consensus Statement on Inpatient Glycemic Control (2015)  Target Ranges:  Prepandial:   less than 140 mg/dL      Peak postprandial:   less than 180 mg/dL (1-2 hours)      Critically ill patients:  140 - 180 mg/dL   Lab Results  Component Value Date   GLUCAP 282 (H) 04/25/2019    Review of Glycemic Control Results for CAPRI, VEALS (MRN [id number]) as of 04/25/2019 10:09  Ref. Range 04/24/2019 09:03 04/24/2019 10:31 04/24/2019 12:03 04/24/2019 16:16 04/25/2019 00:00 04/25/2019 08:18  Glucose-Capillary Latest Ref Range: 70 - 99 mg/dL 96 112 (H) 163 (H) 331 (H) 345 (H) 282 (H)   Diabetes history: DM 2 Outpatient Diabetes medications: Glyburide 5 mg bid, Metformin 1000 mg Daily Current orders for Inpatient glycemic control:  Novolog 0-15 units tid Metformin 500 mg bid  Inpatient Diabetes Program Recommendations:    Decadron 10 mg given yesterday. Glucose trends increased into the 300 range. Fasting glucose 282 this am.  Consider increasing Novolog Correction to Novolog 0-20 units tid and add Novolog 0-5 units qhs for bedtime coverage for today. Will need to decrease back down as steroids clear.  Glucose trends should start to decrease within the next 24-48 hours.  Thanks,  Tama Headings RN, MSN, BC-ADM Inpatient Diabetes Coordinator Team Pager [phone] (8a-5p)

## 2019-04-25 NOTE — Progress Notes (Addendum)
Neurosurgery Service Progress Note  Subjective: No acute events overnight, no rhinorrhea, leaked yesterday and required LD placement. Put out 23cc yesterday then tapered off, none since. No rhinorrhea per pt, but bedside nurse noted that she definitely leaks when they turn her.   Objective: Vitals:   04/25/19 0500 04/25/19 0600 04/25/19 0700 04/25/19 0800  BP: 96/61 (!) 97/57 119/71 119/75  Pulse: 82 80 82 81  Resp: 16 15 17 16   Temp:    98.3 F (36.8 C)  TempSrc:    Oral  SpO2: 98% 97% 100% 100%  Weight:      Height:       Temp (24hrs), Avg:98.5 F (36.9 C), Min:97.9 F (36.6 C), Max:100.2 F (37.9 C)  CBC Latest Ref Rng & Units 04/23/2019 04/17/2019 04/15/2019  WBC 4.0 - 10.5 K/uL 7.5 12.2(H) 8.7  Hemoglobin 12.0 - 15.0 g/dL 7.2(L) 8.8(L) 9.2(L)  Hematocrit 36.0 - 46.0 % 23.3(L) 28.7(L) 29.7(L)  Platelets 150 - 400 K/uL 253 230 229   BMP Latest Ref Rng & Units 04/25/2019 04/24/2019 04/23/2019  Glucose 70 - 99 mg/dL 314(H) 107(H) 170(H)  BUN 8 - 23 mg/dL 47(H) 48(H) 48(H)  Creatinine 0.44 - 1.00 mg/dL 1.79(H) 1.91(H) 2.03(H)  Sodium 135 - 145 mmol/L 132(L) 134(L) 134(L)  Potassium 3.5 - 5.1 mmol/L 5.0 4.1 4.0  Chloride 98 - 111 mmol/L 107 105 105  CO2 22 - 32 mmol/L 14(L) 17(L) 18(L)  Calcium 8.9 - 10.3 mg/dL 8.5(L) 8.8(L) 8.8(L)    Intake/Output Summary (Last 24 hours) at 04/25/2019 0945 Last data filed at 04/25/2019 0800 Gross per 24 hour  Intake 2490.35 ml  Output 2578 ml  Net -87.65 ml    Current Facility-Administered Medications:  .  0.9 %  sodium chloride infusion, 250 mL, Intravenous, Continuous, Eustace Moore, MD .  0.9 % NaCl with KCl 20 mEq/ L  infusion, , Intravenous, Continuous, Eustace Moore, MD, Last Rate: 75 mL/hr at 04/25/19 0800 .  acetaminophen (TYLENOL) tablet 650 mg, 650 mg, Oral, Q4H PRN, 650 mg at 04/23/19 2350 **OR** acetaminophen (TYLENOL) suppository 650 mg, 650 mg, Rectal, Q4H PRN, Eustace Moore, MD .  acetaminophen (TYLENOL) tablet 650 mg, 650  mg, Oral, Q4H PRN, 650 mg at 04/24/19 1328 **OR** acetaminophen (TYLENOL) suppository 650 mg, 650 mg, Rectal, Q4H PRN, Eustace Moore, MD .  amLODipine (NORVASC) tablet 10 mg, 10 mg, Oral, Daily, Eustace Moore, MD, 10 mg at 04/25/19 0915 .  atorvastatin (LIPITOR) tablet 40 mg, 40 mg, Oral, Daily, Eustace Moore, MD, 40 mg at 04/24/19 1643 .  cephALEXin (KEFLEX) capsule 500 mg, 500 mg, Oral, Q8H, Eustace Moore, MD, 500 mg at 04/25/19 [phone] .  docusate sodium (COLACE) capsule 100 mg, 100 mg, Oral, BID, Eustace Moore, MD, 100 mg at 04/23/19 2157 .  gabapentin (NEURONTIN) capsule 300 mg, 300 mg, Oral, TID, Eustace Moore, MD, 300 mg at 04/25/19 0916 .  [DISCONTINUED] lisinopril (ZESTRIL) tablet 40 mg, 40 mg, Oral, Daily, 40 mg at 04/23/19 1018 **AND** hydrochlorothiazide (HYDRODIURIL) tablet 25 mg, 25 mg, Oral, Daily, Eustace Moore, MD, 25 mg at 04/25/19 0916 .  HYDROcodone-acetaminophen (NORCO/VICODIN) 5-325 MG per tablet 1 tablet, 1 tablet, Oral, Q4H PRN, Eustace Moore, MD, 1 tablet at 04/20/19 1400 .  HYDROmorphone (DILAUDID) injection 0.5 mg, 0.5 mg, Intravenous, Q3H PRN, Eustace Moore, MD .  insulin aspart (novoLOG) injection 0-15 Units, 0-15 Units, Subcutaneous, TID WC, Eustace Moore, MD, 8 Units at 04/25/19 [phone] .  iron polysaccharides (NIFEREX) capsule 150 mg, 150 mg, Oral, BID, Eustace Moore, MD, 150 mg at 04/25/19 0915 .  labetalol (NORMODYNE) injection 10-40 mg, 10-40 mg, Intravenous, Q10 min PRN, Eustace Moore, MD, 10 mg at 04/15/19 1235 .  lidocaine (PF) (XYLOCAINE) 1 % injection, , , PRN, Jacqulynn Cadet, MD, 10 mL at 04/19/19 1436 .  metFORMIN (GLUCOPHAGE) tablet 500 mg, 500 mg, Oral, BID WC, Eustace Moore, MD, 500 mg at 04/25/19 0916 .  ondansetron (ZOFRAN) tablet 4 mg, 4 mg, Oral, Q4H PRN **OR** ondansetron (ZOFRAN) injection 4 mg, 4 mg, Intravenous, Q4H PRN, Eustace Moore, MD .  ondansetron [facility]) tablet 4 mg, 4 mg, Oral, Q6H PRN **OR** ondansetron (ZOFRAN) injection 4 mg,  4 mg, Intravenous, Q6H PRN, Eustace Moore, MD .  polyethylene glycol (MIRALAX / GLYCOLAX) packet 17 g, 17 g, Oral, Daily PRN, Eustace Moore, MD .  promethazine (PHENERGAN) tablet 12.5-25 mg, 12.5-25 mg, Oral, Q4H PRN, Eustace Moore, MD .  senna [facility]) tablet 8.6 mg, 1 tablet, Oral, BID, Eustace Moore, MD .  sodium chloride flush (NS) 0.9 % injection 3 mL, 3 mL, Intravenous, Q12H, Eustace Moore, MD, 3 mL at 04/25/19 0916 .  sodium chloride flush (NS) 0.9 % injection 3 mL, 3 mL, Intravenous, PRN, Eustace Moore, MD .  tiZANidine (ZANAFLEX) tablet 4 mg, 4 mg, Oral, Q8H PRN, Eustace Moore, MD, 4 mg at 04/20/19 1554   Physical Exam: AOx3, PERRL, EOMI, FS, Strength 5/5 x4, SILTx4, VFF with mild bitemporal hemianopsia No rhinorrhea on exam this morning, LD site c/d/i   Assessment & Plan: 63 y.o. 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 Failed bedside lumbar drain placement. 8/11 LD placed under fluoro, minimal output, 8/14 LD removed; 8/16 rhinorrhea, LD replaced  -LD evaluated at bedside, had a kink in the tubing proximally, resolved and now flowing very well -drain 10cc/hr from LD -continue holding lisinopril and glyburide -cont qd RFP, Cr downtrending and GFR improving, appears likely near baseline -hyponatremia, borderline at admission, 136-->132, serum and urine osms with tomorrow morning labs -ISS increased to 0-20U with 0-5U qhs -will update patient's sister today  Judith Part  04/25/19 9:45 AM

## 2019-04-26 LAB — GLUCOSE, CAPILLARY
Glucose-Capillary: 113 mg/dL — ABNORMAL HIGH (ref 70–99)
Glucose-Capillary: 161 mg/dL — ABNORMAL HIGH (ref 70–99)
Glucose-Capillary: 178 mg/dL — ABNORMAL HIGH (ref 70–99)
Glucose-Capillary: 143 mg/dL — ABNORMAL HIGH (ref 70–99)

## 2019-04-26 LAB — RENAL FUNCTION PANEL
Albumin: 2.8 g/dL — ABNORMAL LOW (ref 3.5–5.0)
Anion gap: 8 (ref 5–15)
BUN: 40 mg/dL — ABNORMAL HIGH (ref 8–23)
CO2: 18 mmol/L — ABNORMAL LOW (ref 22–32)
Calcium: 9 mg/dL (ref 8.9–10.3)
Chloride: 112 mmol/L — ABNORMAL HIGH (ref 98–111)
Creatinine, Ser: 1.61 mg/dL — ABNORMAL HIGH (ref 0.44–1.00)
GFR calc Af Amer: 40 mL/min — ABNORMAL LOW (ref >60)
GFR calc non Af Amer: 34 mL/min — ABNORMAL LOW (ref >60)
Glucose, Bld: 154 mg/dL — ABNORMAL HIGH (ref 70–99)
Phosphorus: 3.3 mg/dL (ref 2.5–4.6)
Potassium: 5 mmol/L (ref 3.5–5.1)
Sodium: 138 mmol/L (ref 135–145)

## 2019-04-26 LAB — OSMOLALITY: Osmolality: 310 mOsm/kg — ABNORMAL HIGH (ref 275–295)

## 2019-04-26 NOTE — Progress Notes (Signed)
Neurosurgery Service Progress Note  Subjective: No acute events overnight, no rhinorrhea, no further leakage, having worsening headaches this morning  Objective: Vitals:   04/26/19 0300 04/26/19 0400 04/26/19 0500 04/26/19 0600  BP: 114/68 112/67 102/64 97/66  Pulse: 81 78 76 76  Resp: 15 16 16 16   Temp:  98.1 F (36.7 C)    TempSrc:  Oral    SpO2: 100% 99% 98% 98%  Weight:      Height:       Temp (24hrs), Avg:98.1 F (36.7 C), Min:98 F (36.7 C), Max:98.3 F (36.8 C)  CBC Latest Ref Rng & Units 04/23/2019 04/17/2019 04/15/2019  WBC 4.0 - 10.5 K/uL 7.5 12.2(H) 8.7  Hemoglobin 12.0 - 15.0 g/dL 7.2(L) 8.8(L) 9.2(L)  Hematocrit 36.0 - 46.0 % 23.3(L) 28.7(L) 29.7(L)  Platelets 150 - 400 K/uL 253 230 229   BMP Latest Ref Rng & Units 04/26/2019 04/25/2019 04/24/2019  Glucose 70 - 99 mg/dL 154(H) 314(H) 107(H)  BUN 8 - 23 mg/dL 40(H) 47(H) 48(H)  Creatinine 0.44 - 1.00 mg/dL 1.61(H) 1.79(H) 1.91(H)  Sodium 135 - 145 mmol/L 138 132(L) 134(L)  Potassium 3.5 - 5.1 mmol/L 5.0 5.0 4.1  Chloride 98 - 111 mmol/L 112(H) 107 105  CO2 22 - 32 mmol/L 18(L) 14(L) 17(L)  Calcium 8.9 - 10.3 mg/dL 9.0 8.5(L) 8.8(L)    Intake/Output Summary (Last 24 hours) at 04/26/2019 0752 Last data filed at 04/26/2019 0600 Gross per 24 hour  Intake 1676.03 ml  Output 1803 ml  Net -126.97 ml    Current Facility-Administered Medications:  .  0.9 %  sodium chloride infusion, 250 mL, Intravenous, Continuous, Eustace Moore, MD .  0.9 % NaCl with KCl 20 mEq/ L  infusion, , Intravenous, Continuous, Eustace Moore, MD, Last Rate: 75 mL/hr at 04/26/19 0600 .  acetaminophen (TYLENOL) tablet 650 mg, 650 mg, Oral, Q4H PRN, 650 mg at 04/24/19 1328 **OR** acetaminophen (TYLENOL) suppository 650 mg, 650 mg, Rectal, Q4H PRN, Eustace Moore, MD .  amLODipine (NORVASC) tablet 10 mg, 10 mg, Oral, Daily, Eustace Moore, MD, 10 mg at 04/25/19 0915 .  atorvastatin (LIPITOR) tablet 40 mg, 40 mg, Oral, Daily, Eustace Moore, MD, 40  mg at 04/25/19 1745 .  cephALEXin (KEFLEX) capsule 500 mg, 500 mg, Oral, Q8H, Eustace Moore, MD, 500 mg at 04/26/19 4627 .  docusate sodium (COLACE) capsule 100 mg, 100 mg, Oral, BID, Eustace Moore, MD, 100 mg at 04/25/19 2241 .  gabapentin (NEURONTIN) capsule 300 mg, 300 mg, Oral, TID, Eustace Moore, MD, 300 mg at 04/25/19 2241 .  [DISCONTINUED] lisinopril (ZESTRIL) tablet 40 mg, 40 mg, Oral, Daily, 40 mg at 04/23/19 1018 **AND** hydrochlorothiazide (HYDRODIURIL) tablet 25 mg, 25 mg, Oral, Daily, Eustace Moore, MD, 25 mg at 04/25/19 0916 .  HYDROcodone-acetaminophen (NORCO/VICODIN) 5-325 MG per tablet 1 tablet, 1 tablet, Oral, Q4H PRN, Eustace Moore, MD, 1 tablet at 04/26/19 0734 .  HYDROmorphone (DILAUDID) injection 0.5 mg, 0.5 mg, Intravenous, Q3H PRN, Eustace Moore, MD .  insulin aspart (novoLOG) injection 0-20 Units, 0-20 Units, Subcutaneous, TID WC, Judith Part, MD, 7 Units at 04/25/19 1745 .  insulin aspart (novoLOG) injection 0-5 Units, 0-5 Units, Subcutaneous, QHS, Jasmene Goswami A, MD .  iron polysaccharides (NIFEREX) capsule 150 mg, 150 mg, Oral, BID, Eustace Moore, MD, 150 mg at 04/25/19 2241 .  labetalol (NORMODYNE) injection 10-40 mg, 10-40 mg, Intravenous, Q10 min PRN, Eustace Moore, MD, 10 mg at  04/15/19 1235 .  lidocaine (PF) (XYLOCAINE) 1 % injection, , , PRN, Jacqulynn Cadet, MD, 10 mL at 04/19/19 1436 .  metFORMIN (GLUCOPHAGE) tablet 500 mg, 500 mg, Oral, BID WC, Eustace Moore, MD, 500 mg at 04/26/19 0734 .  ondansetron (ZOFRAN) tablet 4 mg, 4 mg, Oral, Q4H PRN **OR** ondansetron (ZOFRAN) injection 4 mg, 4 mg, Intravenous, Q4H PRN, Eustace Moore, MD .  ondansetron [facility]) tablet 4 mg, 4 mg, Oral, Q6H PRN **OR** ondansetron (ZOFRAN) injection 4 mg, 4 mg, Intravenous, Q6H PRN, Eustace Moore, MD .  polyethylene glycol (MIRALAX / GLYCOLAX) packet 17 g, 17 g, Oral, Daily PRN, Eustace Moore, MD .  promethazine (PHENERGAN) tablet 12.5-25 mg, 12.5-25 mg, Oral,  Q4H PRN, Eustace Moore, MD .  senna [facility]) tablet 8.6 mg, 1 tablet, Oral, BID, Eustace Moore, MD, 8.6 mg at 04/25/19 2241 .  sodium chloride flush (NS) 0.9 % injection 3 mL, 3 mL, Intravenous, Q12H, Eustace Moore, MD, 3 mL at 04/25/19 0916 .  sodium chloride flush (NS) 0.9 % injection 3 mL, 3 mL, Intravenous, PRN, Eustace Moore, MD .  tiZANidine (ZANAFLEX) tablet 4 mg, 4 mg, Oral, Q8H PRN, Eustace Moore, MD, 4 mg at 04/20/19 1554   Physical Exam: AOx3, PERRL, EOMI, FS, Strength 5/5 x4, SILTx4, VFF with mild bitemporal hemianopsia No rhinorrhea on exam this morning, LD site c/d/i   Assessment & Plan: 63 y.o. 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 Failed bedside lumbar drain placement. 8/11 LD placed under fluoro, minimal output, 8/14 LD removed; 8/16 rhinorrhea, LD replaced  -decrease LD to 5cc/hr to help with headaches -continue holding lisinopril and glyburide -cont qd RFP, Cr downtrending and GFR improving -hyponatremia resolved  Judith Part  04/26/19 7:52 AM

## 2019-04-26 NOTE — Progress Notes (Signed)
During a bed change patient started leaking CSF from nose. Lumbar drain continues to drain appropriately. Dr. Zada Finders notified. Verbal order to let drain, drain for 10 cc an hour. Discussed this with patient and questions answered. Will continue to monitor. Lianne Bushy RN BSN.

## 2019-04-27 ENCOUNTER — Inpatient Hospital Stay (HOSPITAL_COMMUNITY): Payer: BC Managed Care – PPO

## 2019-04-27 LAB — GLUCOSE, CAPILLARY
Glucose-Capillary: 142 mg/dL — ABNORMAL HIGH (ref 70–99)
Glucose-Capillary: 155 mg/dL — ABNORMAL HIGH (ref 70–99)
Glucose-Capillary: 145 mg/dL — ABNORMAL HIGH (ref 70–99)
Glucose-Capillary: 124 mg/dL — ABNORMAL HIGH (ref 70–99)

## 2019-04-27 LAB — RENAL FUNCTION PANEL
Albumin: 3.5 g/dL (ref 3.5–5.0)
Anion gap: 11 (ref 5–15)
BUN: 34 mg/dL — ABNORMAL HIGH (ref 8–23)
CO2: 17 mmol/L — ABNORMAL LOW (ref 22–32)
Calcium: 9.9 mg/dL (ref 8.9–10.3)
Chloride: 112 mmol/L — ABNORMAL HIGH (ref 98–111)
Creatinine, Ser: 1.4 mg/dL — ABNORMAL HIGH (ref 0.44–1.00)
GFR calc Af Amer: 47 mL/min — ABNORMAL LOW (ref >60)
GFR calc non Af Amer: 40 mL/min — ABNORMAL LOW (ref >60)
Glucose, Bld: 155 mg/dL — ABNORMAL HIGH (ref 70–99)
Phosphorus: 4.1 mg/dL (ref 2.5–4.6)
Potassium: 4.9 mmol/L (ref 3.5–5.1)
Sodium: 140 mmol/L (ref 135–145)

## 2019-04-27 LAB — CSF CULTURE W GRAM STAIN: Culture: NO GROWTH

## 2019-04-27 MED ORDER — HEPARIN SODIUM (PORCINE) 5000 UNIT/ML IJ SOLN
5000.0000 [IU] | Freq: Three times a day (TID) | INTRAMUSCULAR | Status: DC
  Administered 2019-04-27 – 2019-05-15 (×54): 5000 [IU] via SUBCUTANEOUS
  Filled 2019-04-27 (×51): qty 1

## 2019-04-27 NOTE — Progress Notes (Signed)
Neurosurgery Service Progress Note  Subjective: No acute events overnight, no rhinorrhea after drain increased back to 10/hr, having some delirium   Objective: Vitals:   04/27/19 0400 04/27/19 0500 04/27/19 0600 04/27/19 0700  BP: 130/79 134/82 138/75 127/75  Pulse: 97 100 96 99  Resp: 17 (!) 21 17 15   Temp: 99.3 F (37.4 C)     TempSrc: Oral     SpO2: 98% 98% 99% 97%  Weight:      Height:       Temp (24hrs), Avg:99 F (37.2 C), Min:98.8 F (37.1 C), Max:99.3 F (37.4 C)  CBC Latest Ref Rng & Units 04/23/2019 04/17/2019 04/15/2019  WBC 4.0 - 10.5 K/uL 7.5 12.2(H) 8.7  Hemoglobin 12.0 - 15.0 g/dL 7.2(L) 8.8(L) 9.2(L)  Hematocrit 36.0 - 46.0 % 23.3(L) 28.7(L) 29.7(L)  Platelets 150 - 400 K/uL 253 230 229   BMP Latest Ref Rng & Units 04/27/2019 04/26/2019 04/25/2019  Glucose 70 - 99 mg/dL 155(H) 154(H) 314(H)  BUN 8 - 23 mg/dL 34(H) 40(H) 47(H)  Creatinine 0.44 - 1.00 mg/dL 1.40(H) 1.61(H) 1.79(H)  Sodium 135 - 145 mmol/L 140 138 132(L)  Potassium 3.5 - 5.1 mmol/L 4.9 5.0 5.0  Chloride 98 - 111 mmol/L 112(H) 112(H) 107  CO2 22 - 32 mmol/L 17(L) 18(L) 14(L)  Calcium 8.9 - 10.3 mg/dL 9.9 9.0 8.5(L)    Intake/Output Summary (Last 24 hours) at 04/27/2019 4097 Last data filed at 04/27/2019 0700 Gross per 24 hour  Intake -  Output 756 ml  Net -756 ml    Current Facility-Administered Medications:  .  0.9 %  sodium chloride infusion, 250 mL, Intravenous, Continuous, Eustace Moore, MD .  0.9 % NaCl with KCl 20 mEq/ L  infusion, , Intravenous, Continuous, Eustace Moore, MD, Stopped at 04/26/19 [phone] .  acetaminophen (TYLENOL) tablet 650 mg, 650 mg, Oral, Q4H PRN, 650 mg at 04/26/19 1330 **OR** acetaminophen (TYLENOL) suppository 650 mg, 650 mg, Rectal, Q4H PRN, Eustace Moore, MD .  amLODipine (NORVASC) tablet 10 mg, 10 mg, Oral, Daily, Eustace Moore, MD, 10 mg at 04/26/19 0933 .  atorvastatin (LIPITOR) tablet 40 mg, 40 mg, Oral, Daily, Eustace Moore, MD, 40 mg at 04/26/19 1711 .   cephALEXin (KEFLEX) capsule 500 mg, 500 mg, Oral, Q8H, Eustace Moore, MD, 500 mg at 04/27/19 9924 .  docusate sodium (COLACE) capsule 100 mg, 100 mg, Oral, BID, Eustace Moore, MD, 100 mg at 04/26/19 2211 .  gabapentin (NEURONTIN) capsule 300 mg, 300 mg, Oral, TID, Eustace Moore, MD, 300 mg at 04/26/19 2210 .  [DISCONTINUED] lisinopril (ZESTRIL) tablet 40 mg, 40 mg, Oral, Daily, 40 mg at 04/23/19 1018 **AND** hydrochlorothiazide (HYDRODIURIL) tablet 25 mg, 25 mg, Oral, Daily, Eustace Moore, MD, 25 mg at 04/26/19 0933 .  HYDROcodone-acetaminophen (NORCO/VICODIN) 5-325 MG per tablet 1 tablet, 1 tablet, Oral, Q4H PRN, Eustace Moore, MD, 1 tablet at 04/26/19 0734 .  HYDROmorphone (DILAUDID) injection 0.5 mg, 0.5 mg, Intravenous, Q3H PRN, Eustace Moore, MD, 0.5 mg at 04/26/19 0939 .  insulin aspart (novoLOG) injection 0-20 Units, 0-20 Units, Subcutaneous, TID WC, Judith Part, MD, 3 Units at 04/27/19 0805 .  insulin aspart (novoLOG) injection 0-5 Units, 0-5 Units, Subcutaneous, QHS, Christene Pounds A, MD .  iron polysaccharides (NIFEREX) capsule 150 mg, 150 mg, Oral, BID, Eustace Moore, MD, 150 mg at 04/26/19 2210 .  labetalol (NORMODYNE) injection 10-40 mg, 10-40 mg, Intravenous, Q10 min PRN, Eustace Moore,  MD, 10 mg at 04/15/19 1235 .  lidocaine (PF) (XYLOCAINE) 1 % injection, , , PRN, Jacqulynn Cadet, MD, 10 mL at 04/19/19 1436 .  metFORMIN (GLUCOPHAGE) tablet 500 mg, 500 mg, Oral, BID WC, Eustace Moore, MD, 500 mg at 04/27/19 0805 .  ondansetron (ZOFRAN) tablet 4 mg, 4 mg, Oral, Q4H PRN **OR** ondansetron (ZOFRAN) injection 4 mg, 4 mg, Intravenous, Q4H PRN, Eustace Moore, MD .  ondansetron [facility]) tablet 4 mg, 4 mg, Oral, Q6H PRN **OR** ondansetron (ZOFRAN) injection 4 mg, 4 mg, Intravenous, Q6H PRN, Eustace Moore, MD .  polyethylene glycol (MIRALAX / GLYCOLAX) packet 17 g, 17 g, Oral, Daily PRN, Eustace Moore, MD .  promethazine (PHENERGAN) tablet 12.5-25 mg, 12.5-25 mg, Oral,  Q4H PRN, Eustace Moore, MD .  [facility]) tablet 8.6 mg, 1 tablet, Oral, BID, Eustace Moore, MD, 8.6 mg at 04/26/19 2211 .  sodium chloride flush (NS) 0.9 % injection 3 mL, 3 mL, Intravenous, Q12H, Eustace Moore, MD, 3 mL at 04/26/19 2211 .  sodium chloride flush (NS) 0.9 % injection 3 mL, 3 mL, Intravenous, PRN, Eustace Moore, MD .  tiZANidine (ZANAFLEX) tablet 4 mg, 4 mg, Oral, Q8H PRN, Eustace Moore, MD, 4 mg at 04/20/19 1554   Physical Exam: AOx3, PERRL, EOMI, FS, Strength 5/5 x4, SILTx4, VFF with mild bitemporal hemianopsia No rhinorrhea on exam this morning, LD site c/d/i   Assessment & Plan: 63 y.o. 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 Failed bedside lumbar drain placement. 8/11 LD placed under fluoro, minimal output, 8/14 LD removed; 8/16 rhinorrhea, LD replaced  -LD to 10/hr, will need 72h w/o leakage before we clamp it -continue holding lisinopril and glyburide -cont qd RFP, Cr downtrending and GFR improving -delirium - minimize risk factors -SCDs/TEDs/SQH  Judith Part  04/27/19 8:28 AM

## 2019-04-27 NOTE — Progress Notes (Signed)
CTH reviewed, diffuse large volume pneumocephalus, likely through anterior skull base defect due to pressure gradient from LD.   -clamp lumbar drain, stop hourly drainage -continue supplemental O2, will help resolve pneumocephalus faster

## 2019-04-27 NOTE — Progress Notes (Signed)
Dr. Zada Finders aware of CT results. Drain clamped at this time, patient flat with NRB on. Will continue to monitor. Lianne Bushy RN BSN.

## 2019-04-27 NOTE — Progress Notes (Signed)
Patient becoming more somnolent, difficulty with words. Dr. Zada Finders notified. New orders received and carried out. Will continue to monitor. Lianne Bushy RN BSN.

## 2019-04-28 LAB — RENAL FUNCTION PANEL
Albumin: 3.2 g/dL — ABNORMAL LOW (ref 3.5–5.0)
Anion gap: 11 (ref 5–15)
BUN: 42 mg/dL — ABNORMAL HIGH (ref 8–23)
CO2: 18 mmol/L — ABNORMAL LOW (ref 22–32)
Calcium: 9.7 mg/dL (ref 8.9–10.3)
Chloride: 113 mmol/L — ABNORMAL HIGH (ref 98–111)
Creatinine, Ser: 1.91 mg/dL — ABNORMAL HIGH (ref 0.44–1.00)
GFR calc Af Amer: 32 mL/min — ABNORMAL LOW (ref >60)
GFR calc non Af Amer: 28 mL/min — ABNORMAL LOW (ref >60)
Glucose, Bld: 166 mg/dL — ABNORMAL HIGH (ref 70–99)
Phosphorus: 5.2 mg/dL — ABNORMAL HIGH (ref 2.5–4.6)
Potassium: 4.6 mmol/L (ref 3.5–5.1)
Sodium: 142 mmol/L (ref 135–145)

## 2019-04-28 LAB — GLUCOSE, CAPILLARY
Glucose-Capillary: 130 mg/dL — ABNORMAL HIGH (ref 70–99)
Glucose-Capillary: 148 mg/dL — ABNORMAL HIGH (ref 70–99)
Glucose-Capillary: 114 mg/dL — ABNORMAL HIGH (ref 70–99)
Glucose-Capillary: 129 mg/dL — ABNORMAL HIGH (ref 70–99)

## 2019-04-28 LAB — URINALYSIS, ROUTINE W REFLEX MICROSCOPIC
Bilirubin Urine: NEGATIVE
Glucose, UA: NEGATIVE mg/dL
Hgb urine dipstick: NEGATIVE
Ketones, ur: NEGATIVE mg/dL
Leukocytes,Ua: NEGATIVE
Nitrite: NEGATIVE
Protein, ur: NEGATIVE mg/dL
Specific Gravity, Urine: 1.011 (ref 1.005–1.030)
pH: 5 (ref 5.0–8.0)

## 2019-04-28 MED ORDER — GERHARDT'S BUTT CREAM
TOPICAL_CREAM | Freq: Two times a day (BID) | CUTANEOUS | Status: DC
  Administered 2019-04-28: 1 via TOPICAL
  Administered 2019-04-29: 01:00:00 via TOPICAL
  Administered 2019-04-29 – 2019-04-30 (×4): 1 via TOPICAL
  Administered 2019-05-01: 21:00:00 via TOPICAL
  Administered 2019-05-01 – 2019-05-02 (×2): 1 via TOPICAL
  Administered 2019-05-03 – 2019-05-05 (×6): via TOPICAL
  Administered 2019-05-05: 1 via TOPICAL
  Administered 2019-05-06 – 2019-05-09 (×7): via TOPICAL
  Administered 2019-05-10: 1 via TOPICAL
  Administered 2019-05-10 (×2): via TOPICAL
  Administered 2019-05-11: 1 via TOPICAL
  Administered 2019-05-11: 10:00:00 via TOPICAL
  Administered 2019-05-13: 1 via TOPICAL
  Administered 2019-05-14: 22:00:00 via TOPICAL
  Administered 2019-05-14 – 2019-05-15 (×2): 1 via TOPICAL
  Administered 2019-05-16 (×2): via TOPICAL
  Administered 2019-05-17 – 2019-05-18 (×3): 1 via TOPICAL
  Administered 2019-05-19 – 2019-05-20 (×3): via TOPICAL
  Administered 2019-05-20: 1 via TOPICAL
  Administered 2019-05-21 – 2019-05-25 (×10): via TOPICAL
  Administered 2019-05-26: 1 via TOPICAL
  Administered 2019-05-26 – 2019-05-27 (×2): via TOPICAL
  Administered 2019-05-28: 1 via TOPICAL
  Administered 2019-05-30: 10:00:00 via TOPICAL
  Administered 2019-05-31: 1 via TOPICAL
  Administered 2019-05-31 – 2019-06-02 (×2): via TOPICAL
  Administered 2019-06-03: 1 via TOPICAL
  Administered 2019-06-04 – 2019-06-11 (×9): via TOPICAL
  Filled 2019-04-28 (×2): qty 1

## 2019-04-28 NOTE — Consult Note (Signed)
[place] Nurse wound consult note Reason for Consult: Dehiscence of abdominal wound. 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  This was originally sealed with dermabond. Began  Device/moisture related breakdown from urine and purewick incontinence external  management .  This is removed due to irritation.  Patient is returning to surgery tomorrow and MD will re-seal this wound at that time. Will keep a NS moist gauze in place until surgery.  Wound type: surgical MASD and pressure from device Pressure Injury POA: No Measurement: 1 cm dehiscence to right abdominal wound Erythema  Wound [id number] wound  Drainage (amount, consistency, odor) minimal purulence to abdominal wound Periwound:intact Dressing procedure/placement/frequency:  Gerhardts butt paste to labia and perineal skin NS moist gauze to right abdominal wound and cover with dry dressing.  Change twice daily.   Will not follow at this time.  Please re-consult if needed.  Domenic Moras MSN, RN, FNP-BC CWON Wound, Ostomy, Continence Nurse Pager [phone]

## 2019-04-28 NOTE — Progress Notes (Signed)
Neurosurgery Service Progress Note  Subjective: No acute events overnight, worsening somnolence yesterday, CTH showed pneumocephalus, drain clamped, improving  Objective: Vitals:   04/28/19 0500 04/28/19 0600 04/28/19 0700 04/28/19 0800  BP: 112/63 111/68 108/66 119/71  Pulse: 85 83 82 78  Resp: 16 15 14 14   Temp:      TempSrc:      SpO2: 100% 100% 100% 100%  Weight:      Height:       Temp (24hrs), Avg:98.6 F (37 C), Min:97.8 F (36.6 C), Max:99.4 F (37.4 C)  CBC Latest Ref Rng & Units 04/23/2019 04/17/2019 04/15/2019  WBC 4.0 - 10.5 K/uL 7.5 12.2(H) 8.7  Hemoglobin 12.0 - 15.0 g/dL 7.2(L) 8.8(L) 9.2(L)  Hematocrit 36.0 - 46.0 % 23.3(L) 28.7(L) 29.7(L)  Platelets 150 - 400 K/uL 253 230 229   BMP Latest Ref Rng & Units 04/28/2019 04/27/2019 04/26/2019  Glucose 70 - 99 mg/dL 166(H) 155(H) 154(H)  BUN 8 - 23 mg/dL 42(H) 34(H) 40(H)  Creatinine 0.44 - 1.00 mg/dL 1.91(H) 1.40(H) 1.61(H)  Sodium 135 - 145 mmol/L 142 140 138  Potassium 3.5 - 5.1 mmol/L 4.6 4.9 5.0  Chloride 98 - 111 mmol/L 113(H) 112(H) 112(H)  CO2 22 - 32 mmol/L 18(L) 17(L) 18(L)  Calcium 8.9 - 10.3 mg/dL 9.7 9.9 9.0    Intake/Output Summary (Last 24 hours) at 04/28/2019 0847 Last data filed at 04/28/2019 0630 Gross per 24 hour  Intake 3 ml  Output 624 ml  Net -621 ml    Current Facility-Administered Medications:  .  0.9 %  sodium chloride infusion, 250 mL, Intravenous, Continuous, Eustace Moore, MD .  0.9 % NaCl with KCl 20 mEq/ L  infusion, , Intravenous, Continuous, Eustace Moore, MD, Stopped at 04/26/19 [phone] .  acetaminophen (TYLENOL) tablet 650 mg, 650 mg, Oral, Q4H PRN, 650 mg at 04/26/19 1330 **OR** acetaminophen (TYLENOL) suppository 650 mg, 650 mg, Rectal, Q4H PRN, Eustace Moore, MD .  amLODipine (NORVASC) tablet 10 mg, 10 mg, Oral, Daily, Eustace Moore, MD, 10 mg at 04/27/19 0943 .  atorvastatin (LIPITOR) tablet 40 mg, 40 mg, Oral, Daily, Eustace Moore, MD, 40 mg at 04/27/19 1718 .  cephALEXin  (KEFLEX) capsule 500 mg, 500 mg, Oral, Q8H, Eustace Moore, MD, 500 mg at 04/28/19 [id number] .  docusate sodium (COLACE) capsule 100 mg, 100 mg, Oral, BID, Eustace Moore, MD, 100 mg at 04/27/19 2254 .  gabapentin (NEURONTIN) capsule 300 mg, 300 mg, Oral, TID, Eustace Moore, MD, 300 mg at 04/27/19 2253 .  heparin injection 5,000 Units, 5,000 Units, Subcutaneous, Q8H, Judith Part, MD, 5,000 Units at 04/28/19 [phone] .  [DISCONTINUED] lisinopril (ZESTRIL) tablet 40 mg, 40 mg, Oral, Daily, 40 mg at 04/23/19 1018 **AND** hydrochlorothiazide (HYDRODIURIL) tablet 25 mg, 25 mg, Oral, Daily, Eustace Moore, MD, 25 mg at 04/27/19 0943 .  HYDROcodone-acetaminophen (NORCO/VICODIN) 5-325 MG per tablet 1 tablet, 1 tablet, Oral, Q4H PRN, Eustace Moore, MD, 1 tablet at 04/26/19 0734 .  HYDROmorphone (DILAUDID) injection 0.5 mg, 0.5 mg, Intravenous, Q3H PRN, Eustace Moore, MD, 0.5 mg at 04/26/19 0939 .  insulin aspart (novoLOG) injection 0-20 Units, 0-20 Units, Subcutaneous, TID WC, Judith Part, MD, 3 Units at 04/27/19 1718 .  insulin aspart (novoLOG) injection 0-5 Units, 0-5 Units, Subcutaneous, QHS, Clodagh Odenthal A, MD .  iron polysaccharides (NIFEREX) capsule 150 mg, 150 mg, Oral, BID, Eustace Moore, MD, 150 mg at 04/27/19 2253 .  labetalol (  NORMODYNE) injection 10-40 mg, 10-40 mg, Intravenous, Q10 min PRN, Eustace Moore, MD, 10 mg at 04/15/19 1235 .  lidocaine (PF) (XYLOCAINE) 1 % injection, , , PRN, Jacqulynn Cadet, MD, 10 mL at 04/19/19 1436 .  metFORMIN (GLUCOPHAGE) tablet 500 mg, 500 mg, Oral, BID WC, Eustace Moore, MD, 500 mg at 04/27/19 1718 .  ondansetron (ZOFRAN) tablet 4 mg, 4 mg, Oral, Q4H PRN **OR** ondansetron (ZOFRAN) injection 4 mg, 4 mg, Intravenous, Q4H PRN, Eustace Moore, MD .  ondansetron [facility]) tablet 4 mg, 4 mg, Oral, Q6H PRN **OR** ondansetron (ZOFRAN) injection 4 mg, 4 mg, Intravenous, Q6H PRN, Eustace Moore, MD .  polyethylene glycol (MIRALAX / GLYCOLAX) packet 17 g,  17 g, Oral, Daily PRN, Eustace Moore, MD .  promethazine (PHENERGAN) tablet 12.5-25 mg, 12.5-25 mg, Oral, Q4H PRN, Eustace Moore, MD .  senna [facility]) tablet 8.6 mg, 1 tablet, Oral, BID, Eustace Moore, MD, 8.6 mg at 04/27/19 2254 .  sodium chloride flush (NS) 0.9 % injection 3 mL, 3 mL, Intravenous, Q12H, Eustace Moore, MD, 3 mL at 04/27/19 2300 .  sodium chloride flush (NS) 0.9 % injection 3 mL, 3 mL, Intravenous, PRN, Eustace Moore, MD .  tiZANidine (ZANAFLEX) tablet 4 mg, 4 mg, Oral, Q8H PRN, Eustace Moore, MD, 4 mg at 04/20/19 1554   Physical Exam: Somnolent, Ox2, PERRL, EOMI, FS, Strength 5/5 x4, SILTx4, VFF with mild bitemporal hemianopsia No rhinorrhea on exam this morning Abdominal fat graft incision with some dehiscence and purulence  Assessment & Plan: 63 y.o. 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 Failed bedside lumbar drain placement. 8/11 LD placed under fluoro, minimal output, 8/14 LD removed; 8/16 rhinorrhea, LD replaced, 8/19 somnolence, CTH w/ large volume pneumocephalus, LD clamped  -keep LD clamped -will need to go to the OR tomorrow for repeat skull base reconstruction -will get sterile wound cultures in the OR and start on ABx post-op for fat graft incision wound infection -restart IVF given RFP -continue holding lisinopril and glyburide -cont qd RFP -SCDs/TEDs/SQH -will update pt's family today  Judith Part  04/28/19 8:47 AM

## 2019-04-29 ENCOUNTER — Inpatient Hospital Stay (HOSPITAL_COMMUNITY): Payer: BC Managed Care – PPO | Admitting: Certified Registered Nurse Anesthetist

## 2019-04-29 ENCOUNTER — Encounter (HOSPITAL_COMMUNITY)
Admission: RE | Disposition: A | Discharge status: Rehabilitation Facility | Payer: Self-pay | Source: Home / Self Care | Attending: Neurological Surgery

## 2019-04-29 ENCOUNTER — Encounter (HOSPITAL_COMMUNITY): Payer: Self-pay | Admitting: Certified Registered Nurse Anesthetist

## 2019-04-29 DIAGNOSIS — G9782 Other postprocedural complications and disorders of nervous system: Secondary | ICD-10-CM | POA: Diagnosis not present

## 2019-04-29 DIAGNOSIS — G960 Cerebrospinal fluid leak: Secondary | ICD-10-CM | POA: Diagnosis not present

## 2019-04-29 HISTORY — PX: REPAIR OF CEREBROSPINAL FLUID LEAK: SHX6322

## 2019-04-29 HISTORY — PX: TRANSPHENOIDAL APPROACH EXPOSURE: SHX6311

## 2019-04-29 LAB — RENAL FUNCTION PANEL
Albumin: 3.5 g/dL (ref 3.5–5.0)
Anion gap: 12 (ref 5–15)
BUN: 41 mg/dL — ABNORMAL HIGH (ref 8–23)
CO2: 16 mmol/L — ABNORMAL LOW (ref 22–32)
Calcium: 9.6 mg/dL (ref 8.9–10.3)
Chloride: 113 mmol/L — ABNORMAL HIGH (ref 98–111)
Creatinine, Ser: 1.63 mg/dL — ABNORMAL HIGH (ref 0.44–1.00)
GFR calc Af Amer: 39 mL/min — ABNORMAL LOW (ref >60)
GFR calc non Af Amer: 34 mL/min — ABNORMAL LOW (ref >60)
Glucose, Bld: 132 mg/dL — ABNORMAL HIGH (ref 70–99)
Phosphorus: 4.5 mg/dL (ref 2.5–4.6)
Potassium: 4.5 mmol/L (ref 3.5–5.1)
Sodium: 141 mmol/L (ref 135–145)

## 2019-04-29 LAB — GLUCOSE, CAPILLARY
Glucose-Capillary: 139 mg/dL — ABNORMAL HIGH (ref 70–99)
Glucose-Capillary: 123 mg/dL — ABNORMAL HIGH (ref 70–99)
Glucose-Capillary: 112 mg/dL — ABNORMAL HIGH (ref 70–99)
Glucose-Capillary: 110 mg/dL — ABNORMAL HIGH (ref 70–99)
Glucose-Capillary: 186 mg/dL — ABNORMAL HIGH (ref 70–99)
Glucose-Capillary: 278 mg/dL — ABNORMAL HIGH (ref 70–99)

## 2019-04-29 LAB — TYPE AND SCREEN
ABO/RH(D): A POS
Antibody Screen: NEGATIVE

## 2019-04-29 LAB — SURGICAL PCR SCREEN
MRSA, PCR: NEGATIVE
Staphylococcus aureus: NEGATIVE

## 2019-04-29 SURGERY — REPAIR OF CEREBROSPINAL FLUID LEAK
Anesthesia: General | Site: Nose

## 2019-04-29 MED ORDER — HEMOSTATIC AGENTS (NO CHARGE) OPTIME
TOPICAL | Status: DC | PRN
  Administered 2019-04-29 (×2): 1 via TOPICAL

## 2019-04-29 MED ORDER — SODIUM CHLORIDE 0.9 % IV SOLN
Freq: Once | INTRAVENOUS | Status: AC
  Administered 2019-04-30: 11:00:00 via INTRAVENOUS

## 2019-04-29 MED ORDER — SALINE SPRAY 0.65 % NA SOLN
4.0000 | NASAL | Status: DC | PRN
  Filled 2019-04-29 (×2): qty 44

## 2019-04-29 MED ORDER — FENTANYL CITRATE (PF) 250 MCG/5ML IJ SOLN
INTRAMUSCULAR | Status: AC
  Filled 2019-04-29: qty 5

## 2019-04-29 MED ORDER — SODIUM CHLORIDE 0.9 % IR SOLN
Status: DC | PRN
  Administered 2019-04-29: 1000 mL

## 2019-04-29 MED ORDER — PROPOFOL 10 MG/ML IV BOLUS
INTRAVENOUS | Status: DC | PRN
  Administered 2019-04-29: 170 mg via INTRAVENOUS

## 2019-04-29 MED ORDER — PHENYLEPHRINE 40 MCG/ML (10ML) SYRINGE FOR IV PUSH (FOR BLOOD PRESSURE SUPPORT)
PREFILLED_SYRINGE | INTRAVENOUS | Status: AC
  Filled 2019-04-29: qty 10

## 2019-04-29 MED ORDER — FENTANYL CITRATE (PF) 250 MCG/5ML IJ SOLN
INTRAMUSCULAR | Status: DC | PRN
  Administered 2019-04-29: 100 ug via INTRAVENOUS
  Administered 2019-04-29 (×2): 50 ug via INTRAVENOUS
  Administered 2019-04-29 (×3): 100 ug via INTRAVENOUS

## 2019-04-29 MED ORDER — SODIUM CHLORIDE 0.9 % IV SOLN
INTRAVENOUS | Status: DC | PRN
  Administered 2019-04-29: 40 ug/min via INTRAVENOUS

## 2019-04-29 MED ORDER — SUCCINYLCHOLINE CHLORIDE 20 MG/ML IJ SOLN
INTRAMUSCULAR | Status: DC | PRN
  Administered 2019-04-29: 140 mg via INTRAVENOUS

## 2019-04-29 MED ORDER — MUPIROCIN CALCIUM 2 % EX CREA
TOPICAL_CREAM | CUTANEOUS | Status: AC
  Filled 2019-04-29: qty 15

## 2019-04-29 MED ORDER — BACITRACIN ZINC 500 UNIT/GM EX OINT
TOPICAL_OINTMENT | CUTANEOUS | Status: AC
  Filled 2019-04-29: qty 28.35

## 2019-04-29 MED ORDER — OXYMETAZOLINE HCL 0.05 % NA SOLN
NASAL | Status: DC | PRN
  Administered 2019-04-29: 1 via TOPICAL

## 2019-04-29 MED ORDER — ROCURONIUM BROMIDE 10 MG/ML (PF) SYRINGE
PREFILLED_SYRINGE | INTRAVENOUS | Status: AC
  Filled 2019-04-29: qty 10

## 2019-04-29 MED ORDER — LIDOCAINE-EPINEPHRINE 1 %-1:100000 IJ SOLN
INTRAMUSCULAR | Status: AC
  Filled 2019-04-29: qty 1

## 2019-04-29 MED ORDER — FLUORESCEIN SODIUM 10 % IV SOLN
500.0000 mg | Freq: Once | INTRAVENOUS | Status: DC
  Filled 2019-04-29: qty 5

## 2019-04-29 MED ORDER — FENTANYL CITRATE (PF) 100 MCG/2ML IJ SOLN: 25.0000 ug | INTRAMUSCULAR | Status: DC | PRN

## 2019-04-29 MED ORDER — PHENYLEPHRINE HCL (PRESSORS) 10 MG/ML IV SOLN
INTRAVENOUS | Status: DC | PRN
  Administered 2019-04-29 (×2): 40 ug via INTRAVENOUS

## 2019-04-29 MED ORDER — DEXAMETHASONE SODIUM PHOSPHATE 10 MG/ML IJ SOLN
INTRAMUSCULAR | Status: AC
  Filled 2019-04-29: qty 1

## 2019-04-29 MED ORDER — LIDOCAINE-EPINEPHRINE 1 %-1:100000 IJ SOLN
INTRAMUSCULAR | Status: DC | PRN
  Administered 2019-04-29 (×2): 5 mL

## 2019-04-29 MED ORDER — OXYMETAZOLINE HCL 0.05 % NA SOLN
NASAL | Status: AC
  Filled 2019-04-29: qty 30

## 2019-04-29 MED ORDER — TRIAMCINOLONE ACETONIDE 40 MG/ML IJ SUSP
INTRAMUSCULAR | Status: AC
  Filled 2019-04-29: qty 5

## 2019-04-29 MED ORDER — LIDOCAINE HCL (CARDIAC) PF 100 MG/5ML IV SOSY
PREFILLED_SYRINGE | INTRAVENOUS | Status: DC | PRN
  Administered 2019-04-29: 40 mg via INTRAVENOUS

## 2019-04-29 MED ORDER — LIDOCAINE 2% (20 MG/ML) 5 ML SYRINGE
INTRAMUSCULAR | Status: AC
  Filled 2019-04-29: qty 5

## 2019-04-29 MED ORDER — BACITRACIN ZINC 500 UNIT/GM EX OINT
TOPICAL_OINTMENT | CUTANEOUS | Status: DC | PRN
  Administered 2019-04-29: 1 via TOPICAL

## 2019-04-29 MED ORDER — ONDANSETRON HCL 4 MG/2ML IJ SOLN: 4.0000 mg | Freq: Once | INTRAMUSCULAR | Status: DC | PRN

## 2019-04-29 MED ORDER — PROPOFOL 10 MG/ML IV BOLUS
INTRAVENOUS | Status: AC
  Filled 2019-04-29: qty 20

## 2019-04-29 MED ORDER — ONDANSETRON HCL 4 MG/2ML IJ SOLN
INTRAMUSCULAR | Status: DC | PRN
  Administered 2019-04-29: 4 mg via INTRAVENOUS

## 2019-04-29 MED ORDER — MUPIROCIN 2 % EX OINT
1.0000 "application " | TOPICAL_OINTMENT | Freq: Two times a day (BID) | CUTANEOUS | Status: DC
  Administered 2019-04-29: 1 via NASAL
  Filled 2019-04-29: qty 22

## 2019-04-29 MED ORDER — VANCOMYCIN HCL IN DEXTROSE 1-5 GM/200ML-% IV SOLN
INTRAVENOUS | Status: AC
  Filled 2019-04-29: qty 200

## 2019-04-29 MED ORDER — LACTATED RINGERS IV SOLN
INTRAVENOUS | Status: DC | PRN
  Administered 2019-04-29: 17:00:00 via INTRAVENOUS

## 2019-04-29 MED ORDER — FLUORESCEIN SODIUM 10 % IV SOLN
INTRAVENOUS | Status: DC | PRN
  Administered 2019-04-29: 100 mg via INTRAVENOUS

## 2019-04-29 MED ORDER — SUGAMMADEX SODIUM 200 MG/2ML IV SOLN
INTRAVENOUS | Status: DC | PRN
  Administered 2019-04-29: 220 mg via INTRAVENOUS

## 2019-04-29 MED ORDER — VANCOMYCIN HCL 1000 MG IV SOLR
INTRAVENOUS | Status: DC | PRN
  Administered 2019-04-29: 1000 mg via INTRAVENOUS

## 2019-04-29 MED ORDER — LABETALOL HCL 5 MG/ML IV SOLN
INTRAVENOUS | Status: DC | PRN
  Administered 2019-04-29 (×2): 10 mg via INTRAVENOUS

## 2019-04-29 MED ORDER — 0.9 % SODIUM CHLORIDE (POUR BTL) OPTIME
TOPICAL | Status: DC | PRN
  Administered 2019-04-29: 1000 mL

## 2019-04-29 MED ORDER — ROCURONIUM BROMIDE 10 MG/ML (PF) SYRINGE
PREFILLED_SYRINGE | INTRAVENOUS | Status: DC | PRN
  Administered 2019-04-29: 20 mg via INTRAVENOUS
  Administered 2019-04-29: 40 mg via INTRAVENOUS
  Administered 2019-04-29: 10 mg via INTRAVENOUS

## 2019-04-29 MED ORDER — SODIUM CHLORIDE (PF) 0.9 % IJ SOLN
INTRAMUSCULAR | Status: DC | PRN
  Administered 2019-04-29: 50 mL

## 2019-04-29 SURGICAL SUPPLY — 126 items
BLADE CLIPPER SURG (BLADE) IMPLANT
BLADE RAD40 ROTATE 4M 4 5PK (BLADE) IMPLANT
BLADE RAD40 ROTATE 4M 4MM 5PK (BLADE)
BLADE ROTATE TRICUT 4MX13CM M4 (BLADE)
BLADE ROTATE TRICUT 4X13 M4 (BLADE) IMPLANT
BLADE SURG 15 STRL LF DISP TIS (BLADE) IMPLANT
BLADE SURG 15 STRL SS (BLADE)
BNDG GAUZE ELAST 4 BULKY (GAUZE/BANDAGES/DRESSINGS) IMPLANT
BUR ACORN 9.0 PRECISION (BURR) IMPLANT
BUR ACORN 9.0MM PRECISION (BURR)
BUR DIAMOND 13CMX5MM 70DEG (BURR)
BUR DIAMOND 13X5 70D (BURR) IMPLANT
BUR DIAMOND 15CMX5MM 15SINUS (BURR) ×1
BUR DIAMOND CURV 15X5 15D (BURR) ×2 IMPLANT
BUR MATCHSTICK NEURO 3.0 LAGG (BURR) IMPLANT
BUR SPIRAL ROUTER 2.3 (BUR) IMPLANT
BUR SPIRAL ROUTER 2.3MM (BUR)
CANISTER SUCT 3000ML PPV (MISCELLANEOUS) ×3 IMPLANT
CLEANER TIP ELECTROSURG 2X2 (MISCELLANEOUS) ×3 IMPLANT
CLIP VESOCCLUDE MED 6/CT (CLIP) IMPLANT
COAGULATOR SUCT 8FR VV (MISCELLANEOUS) IMPLANT
CONT SPEC 4OZ CLIKSEAL STRL BL (MISCELLANEOUS) ×3 IMPLANT
COVER WAND RF STERILE (DRAPES) IMPLANT
DERMABOND ADVANCED (GAUZE/BANDAGES/DRESSINGS) ×2
DERMABOND ADVANCED .7 DNX12 (GAUZE/BANDAGES/DRESSINGS) ×1 IMPLANT
DRAPE HALF SHEET 40X57 (DRAPES) IMPLANT
DRAPE NEUROLOGICAL W/INCISE (DRAPES) IMPLANT
DRAPE SHEET LG 3/4 BI-LAMINATE (DRAPES) IMPLANT
DRAPE SURG 17X23 STRL (DRAPES) IMPLANT
DRAPE WARM FLUID 44X44 (DRAPES) IMPLANT
DRESSING NASAL POPE 10X1.5X2.5 (GAUZE/BANDAGES/DRESSINGS) IMPLANT
DRSG NASAL POPE 10X1.5X2.5 (GAUZE/BANDAGES/DRESSINGS)
DRSG TELFA 3X8 NADH (GAUZE/BANDAGES/DRESSINGS) ×3 IMPLANT
DURAPREP 6ML APPLICATOR 50/CS (WOUND CARE) IMPLANT
ELECT BLADE INSULATED 6.5IN (ELECTROSURGICAL) ×3
ELECT COATED BLADE 2.86 ST (ELECTRODE) IMPLANT
ELECT CUT 6.5IN COAT NDL .24CM (ELECTROSURGICAL) ×3
ELECT REM PT RETURN 9FT ADLT (ELECTROSURGICAL) ×6
ELECTRODE BLDE INSULATED 6.5IN (ELECTROSURGICAL) ×1 IMPLANT
ELECTRODE CUT 6.5IN NDL .24CM (ELECTROSURGICAL) ×1 IMPLANT
ELECTRODE REM PT RTRN 9FT ADLT (ELECTROSURGICAL) ×2 IMPLANT
EVACUATOR 1/8 PVC DRAIN (DRAIN) IMPLANT
EVACUATOR SILICONE 100CC (DRAIN) IMPLANT
GAUZE 4X4 16PLY RFD (DISPOSABLE) IMPLANT
GAUZE SPONGE 4X4 12PLY STRL (GAUZE/BANDAGES/DRESSINGS) IMPLANT
GLOVE BIO SURGEON STRL SZ 6.5 (GLOVE) ×4 IMPLANT
GLOVE BIO SURGEON STRL SZ7.5 (GLOVE) ×6 IMPLANT
GLOVE BIO SURGEONS STRL SZ 6.5 (GLOVE) ×2
GLOVE BIOGEL M 7.0 STRL (GLOVE) ×6 IMPLANT
GLOVE BIOGEL PI IND STRL 6.5 (GLOVE) ×2 IMPLANT
GLOVE BIOGEL PI IND STRL 7.5 (GLOVE) ×3 IMPLANT
GLOVE BIOGEL PI INDICATOR 6.5 (GLOVE) ×4
GLOVE BIOGEL PI INDICATOR 7.5 (GLOVE) ×6
GLOVE EXAM NITRILE LRG STRL (GLOVE) IMPLANT
GLOVE EXAM NITRILE XL STR (GLOVE) IMPLANT
GLOVE EXAM NITRILE XS STR PU (GLOVE) IMPLANT
GLOVE SURG SS PI 6.0 STRL IVOR (GLOVE) ×6 IMPLANT
GOWN STRL REUS W/ TWL LRG LVL3 (GOWN DISPOSABLE) ×5 IMPLANT
GOWN STRL REUS W/ TWL XL LVL3 (GOWN DISPOSABLE) IMPLANT
GOWN STRL REUS W/TWL 2XL LVL3 (GOWN DISPOSABLE) IMPLANT
GOWN STRL REUS W/TWL LRG LVL3 (GOWN DISPOSABLE) ×10
GOWN STRL REUS W/TWL XL LVL3 (GOWN DISPOSABLE)
GRAFT DURAGEN MATRIX 1WX1L (Tissue) ×6 IMPLANT
HEMOSTAT SURGICEL 2X14 (HEMOSTASIS) ×3 IMPLANT
KIT BASIN OR (CUSTOM PROCEDURE TRAY) ×3 IMPLANT
KIT TURNOVER KIT B (KITS) ×3 IMPLANT
NEEDLE HYPO 18GX1.5 BLUNT FILL (NEEDLE) ×6 IMPLANT
NEEDLE HYPO 22GX1.5 SAFETY (NEEDLE) IMPLANT
NEEDLE HYPO 25GX1X1/2 BEV (NEEDLE) ×3 IMPLANT
NEEDLE HYPO 25X1 1.5 SAFETY (NEEDLE) ×3 IMPLANT
NEEDLE SPNL 25GX3.5 QUINCKE BL (NEEDLE) ×3 IMPLANT
NS IRRIG 1000ML POUR BTL (IV SOLUTION) ×3 IMPLANT
PACK CRANIOTOMY CUSTOM (CUSTOM PROCEDURE TRAY) IMPLANT
PACK LAMINECTOMY NEURO (CUSTOM PROCEDURE TRAY) ×3 IMPLANT
PAD ARMBOARD 7.5X6 YLW CONV (MISCELLANEOUS) ×6 IMPLANT
PATTIES SURGICAL .5 X.5 (GAUZE/BANDAGES/DRESSINGS) ×3 IMPLANT
PATTIES SURGICAL .5 X3 (DISPOSABLE) IMPLANT
PATTIES SURGICAL 1X1 (DISPOSABLE) IMPLANT
PENCIL BUTTON HOLSTER BLD 10FT (ELECTRODE) ×3 IMPLANT
SEALANT ADHERUS EXTEND TIP (MISCELLANEOUS) ×3 IMPLANT
SHEATH ENDOSCRUB 0 DEG (SHEATH) ×3 IMPLANT
SHEATH ENDOSCRUB 30 DEG (SHEATH) IMPLANT
SHEATH ENDOSCRUB 45 DEG (SHEATH) IMPLANT
SOLUTION ANTI FOG 6CC (MISCELLANEOUS) ×3 IMPLANT
SPECIMEN JAR SMALL (MISCELLANEOUS) IMPLANT
SPLINT NASAL DOYLE BI-VL (GAUZE/BANDAGES/DRESSINGS) IMPLANT
SPONGE NEURO XRAY DETECT 1X3 (DISPOSABLE) ×6 IMPLANT
SPONGE SURGIFOAM ABS GEL 100 (HEMOSTASIS) IMPLANT
STAPLER VISISTAT 35W (STAPLE) IMPLANT
STOCKINETTE 6  STRL (DRAPES)
STOCKINETTE 6 STRL (DRAPES) IMPLANT
SURGIFLO W/THROMBIN 8M KIT (HEMOSTASIS) IMPLANT
SUT 5.0 PDS RB-1 (SUTURE)
SUT ETHILON 3 0 FSL (SUTURE) ×3 IMPLANT
SUT ETHILON 3 0 PS 1 (SUTURE) IMPLANT
SUT ETHILON 6 0 P 1 (SUTURE) IMPLANT
SUT MNCRL 3 0 VIOLET RB1 (SUTURE) ×1 IMPLANT
SUT MONOCRYL 3 0 RB1 (SUTURE) ×2
SUT NURALON 4 0 TR CR/8 (SUTURE) IMPLANT
SUT PDS AB 4-0 RB1 27 (SUTURE) IMPLANT
SUT PDS PLUS AB 5-0 RB-1 (SUTURE) IMPLANT
SUT PLAIN 4 0 ~~LOC~~ 1 (SUTURE) IMPLANT
SUT STEEL 0 (SUTURE)
SUT STEEL 0 18XMFL TIE 17 (SUTURE) IMPLANT
SUT VIC AB 0 CT1 18XCR BRD8 (SUTURE) IMPLANT
SUT VIC AB 0 CT1 8-18 (SUTURE)
SUT VIC AB 2-0 CP2 18 (SUTURE) ×6 IMPLANT
SUT VIC AB 3-0 SH 8-18 (SUTURE) IMPLANT
SUT VIC AB 4-0 P-3 18X BRD (SUTURE) IMPLANT
SUT VIC AB 4-0 P3 18 (SUTURE)
SWAB COLLECTION DEVICE MRSA (MISCELLANEOUS) ×3 IMPLANT
SYR 3ML LL SCALE MARK (SYRINGE) ×3 IMPLANT
SYR CONTROL 10ML LL (SYRINGE) ×6 IMPLANT
TOWEL GREEN STERILE (TOWEL DISPOSABLE) ×3 IMPLANT
TOWEL GREEN STERILE FF (TOWEL DISPOSABLE) ×3 IMPLANT
TRACKER ENT INSTRUMENT (MISCELLANEOUS) IMPLANT
TRACKER ENT PATIENT (MISCELLANEOUS) IMPLANT
TRAP SPECIMEN MUCOUS 40CC (MISCELLANEOUS) IMPLANT
TRAY ENT MC OR (CUSTOM PROCEDURE TRAY) ×3 IMPLANT
TRAY FOLEY MTR SLVR 16FR STAT (SET/KITS/TRAYS/PACK) IMPLANT
TUBE CONNECTING 12'X1/4 (SUCTIONS) ×1
TUBE CONNECTING 12X1/4 (SUCTIONS) ×2 IMPLANT
TUBING EXTENTION W/L.L. (IV SETS) IMPLANT
TUBING STRAIGHTSHOT EPS 5PK (TUBING) IMPLANT
UNDERPAD 30X30 (UNDERPADS AND DIAPERS) IMPLANT
WATER STERILE IRR 1000ML POUR (IV SOLUTION) ×3 IMPLANT

## 2019-04-29 NOTE — Op Note (Signed)
Operative Note:  ENDOSCOPIC CSF LEAK REPAIR      Patient: [name]  Medical record number: [id number]  Date:04/29/2019  Pre-operative Indications: 1.  CSF Leak      2.  Status post revision endoscopic pituitary resection       Postoperative Indications: Same  Surgical Procedure: 1. Endoscopic Repair of CSF Leak          Anesthesia: GET  Surgeon: Delsa Bern, M.D.  Neurosurgeon: Dr. Zada Finders  Complications: None  EBL: Less than 100 cc  Findings: Significant CSF leak in the sphenoid sinus at the previous sellar defect.  No evidence of infection or active bleeding.  Note: The neurosurgical component of the operative procedure is dictated as a separate operative note.   Brief History: The patient is a 63 y.o. female with a history of pituitary mass. The patient has a history of recurrent pituitary adenoma.  She underwent previous transseptal pituitary resection over 15 years ago.  She underwent scanning and was found to have recurrent tumor. The patient was referred to Dr. Zada Finders for neurosurgical evaluation.  The patient underwent revision endoscopic trans-sphenoidal pituitary resection on 04/15/2019.  Approximately 5 days after her primary surgery she developed CSF leak and was treated with a lumbar drain.  She developed pneumocephalus and continued to have intermittent leaking.  Given her history and findings we opted to undertake tied multilayered endoscopic repair of CSF leak.  They understand and agree with our plan for surgery which is scheduled at [facility] under general anesthesia.  Surgical Procedure: The patient is brought to the neurosurgical operating room on 04/29/2019 and placed in supine position on the operating table. General endotracheal anesthesia was established without difficulty. When the patient was adequately anesthetized, surgical timeout was performed with correct identification of the patient and the surgical procedure. The  patient's nose was then injected with 5 cc of 1% lidocaine 1:100,000 dilution epinephrine which was injected in a submucosal fashion. The patient's nose was then packed with Afrin-soaked cottonoid pledgets were left in place for approximately 10 minutes to allow for vasoconstriction and hemostasis.  With the patient prepped draped and prepared for surgery, nasal endoscopy was performed.  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  Packing in the posterior nasal cavity and sphenoid sinus with obvious fluorescein dye consistent with her CSF leak..  A vascularized posterior septal flap was elevated on the patient's right-hand side, pedicled at the posterior nasal choana on the septal branch of the sphenopalatine artery.  The patient had undergone previous trans-septal sphenoidal surgery and had significant scar tissue in the periosteal space of the septum.  Using Bovie electrocautery the flap was demarcated beginning initially at the inferior aspect of the choana and extending down to the floor of the nose, then anteriorly under the inferior turbinate taking care to avoid the lacrimal duct apparatus.  A superior limb was created at the margin of the sphenoid ostium and carried anteriorly several centimeters below the olfactory groove to preserve olfaction.  The incisions were then connected anteriorly along the anterior septum.  The patient had a large septal perforation from her previous surgery and the flap was elevated posterior to this and extended onto the floor of the nose.  The flap was then carefully elevated with blunt and sharp dissection.  The posterior aspect of the flap was mobilized and the flap was placed in the nasopharynx for preservation during the remainder of the procedure until it  was needed.   With adequate access to the pituitary fossa the neurosurgical component of the procedure was begun by Dr. Zada Finders.  This is dictated as a separate  operative report.  Previously placed reconstruction material was carefully removed and the suprasellar defect was identified.  This was reconstructed in multiple layers by Dr. Zada Finders with a DuraGen underlay, abdominal fat and DuraGen overlay graft.  Surgicel was then placed.  The posterior nasal septal flap was then rotated into position within the sphenoid sinus and over the defect.  There was an area at the superior margin where the flap did not completely cover and this was treated with additional abdominal fat and Surgicel.  Adheris dural glue was then placed over the entire flap margin and within the sphenoid sinus.  There was no evidence of active CSF leak or bleeding.  No additional packing was placed.  The patient's nasal cavity was irrigated and suctioned.  Surgical sponge count was correct. An oral gastric tube was passed and the stomach contents were aspirated. Patient was awakened from anesthetic and transferred from the operating room to the recovery room in stable condition. There were no complications and blood loss was less than 100 cc.   Delsa Bern, M.D. [facility] 04/29/2019

## 2019-04-29 NOTE — Transfer of Care (Signed)
Immediate Anesthesia Transfer of Care Note  Patient: [name]  Procedure(s) Performed: Endoscopic Endonasal Cerebrospinal Fluid Leak repair, Abdominal wound revision and fat graft harvest, Intrathecal Fluorocein Injection (N/A Nose) Endoscopic repair of spinal fluid leak  (N/A Nose)  Patient Location: PACU  Anesthesia Type:General  Level of Consciousness: awake and confused  Airway & Oxygen Therapy: non-rebreather face mask  Post-op Assessment: Report given to RN and Post -op Vital signs reviewed and stable  Post vital signs: Reviewed and stable  Last Vitals:  Vitals Value Taken Time  BP    Temp    Pulse 125 04/29/19 1957  Resp 13 04/29/19 1959  SpO2 99 % 04/29/19 1959  Vitals shown include unvalidated device data.  Last Pain:  Vitals:   04/29/19 1200  TempSrc: Axillary  PainSc: 0-No pain      Patients Stated Pain Goal: 4 ([id number])  Complications: No apparent anesthesia complications

## 2019-04-29 NOTE — Progress Notes (Signed)
RN spoke with Dr. Zada Finders who will call pt's daughter to provide consent for her procedure later today and will fill out the form down in the OR.

## 2019-04-29 NOTE — Op Note (Signed)
PATIENT: [name]  DAY OF SURGERY: 04/29/19   PRE-OPERATIVE DIAGNOSIS:  Anterior skull base CSF leak, abdominal wound dehiscence   POST-OPERATIVE DIAGNOSIS:  Same   PROCEDURE:  Endonasal endoscopic anterior skull base reconstruction, left abdominal fat graft harvest, wound revision of prior right abdominal fat graft harvest site   SURGEON:  Surgeon(s) and Role:    Judith Part, MD - Co-surgeon    Jerrell Belfast, MD - Co-surgeon   ANESTHESIA: ETGA   BRIEF HISTORY: This is a 62 year old woman who has continued to have CSF leakage post-op, lumbar drain placement caused pneumocephalus, so repeat endonasal skull base repair was recommended. This was discussed with the patient and her family as well as risks, benefits, and alternatives and wished to proceed with surgery.   OPERATIVE DETAIL: The patient was taken to the operating room and placed on the OR table in the supine position. A formal time out was performed with two patient identifiers and confirmed the operative site. Anesthesia was induced by the anesthesia team. I injected 10mg  of diluted fluorescein into the lumbar drain roughly 20 minutes before incision to allow for leak detection.  While Dr. Wilburn Cornelia harvested a rescue endonasal flap, I created a new 2cm left abdominal incision to harvest a fat graft for the reconstruction. This was irrigated and closed in layers in the usual fashion. The prior right abdominal incision was then exposed next. There was some purulent material that was either fat necrosis or pus. This was cultured before antibiotics were taken. Hemostasis was obtained and the wound was thoroughly irrigated with antibiotic solution. The wound edges were sharply debrided with a #15 blade to healthy tissue, then this was closed in layers in the usual fashion.  I then assisted Dr. Wilburn Cornelia with the skull base reconstruction. We removed the prior graft materials and found a location to the left of midline  that was likely the primary leak point. A duragen graft was placed across this area in the sella. Given the scarring of the optic chiasm to the dura in that location, it was not possible to 'gasket seal' on the intracranial portion. So duragen was placed over the leak point, followed by a fat graft, then a piece of duragen to cover the sellar face, which was covered with surgicel to hold it in place to the edges. The flap was then rotated in place and covered the majority of the defect. There was a small space superiorly that could be a potential space, so I packed this area with fat graft. The entire flap was then glued in place and sealed with Adheris dural sealant. No further CSF leakage was seen. The patient was then returned to anesthesia for emergence. No apparent complications at the completion of the procedure.   EBL:  16mL   DRAINS: none   SPECIMENS: none   Judith Part, MD 04/29/19 7:24 PM

## 2019-04-29 NOTE — Anesthesia Postprocedure Evaluation (Signed)
Anesthesia Post Note  Patient: [name]  Procedure(s) Performed: Endoscopic Endonasal Cerebrospinal Fluid Leak repair, Abdominal wound revision and fat graft harvest, Intrathecal Fluorocein Injection (N/A Nose) Endoscopic repair of spinal fluid leak  (N/A Nose)     Patient location during evaluation: PACU Anesthesia Type: General Level of consciousness: awake and alert and patient cooperative (oriented to self) Pain management: pain level controlled Vital Signs Assessment: post-procedure vital signs reviewed and stable Respiratory status: spontaneous breathing, nonlabored ventilation, respiratory function stable and patient connected to nasal cannula oxygen Cardiovascular status: blood pressure returned to baseline and stable Postop Assessment: no apparent nausea or vomiting Anesthetic complications: no    Last Vitals:  Vitals:   04/29/19 1400 04/29/19 1950  BP: (!) 141/91   Pulse: 91   Resp: 12 12  Temp:  36.6 C  SpO2: 100%     Last Pain:  Vitals:   04/29/19 1200  TempSrc: Axillary  PainSc: 0-No pain                 Alic Hilburn,E. Ayomikun Starling

## 2019-04-29 NOTE — Anesthesia Procedure Notes (Signed)
Arterial Line Insertion Start/End8/21/2020 2:50 PM, 04/29/2019 3:00 PM Performed by: Moshe Salisbury, CRNA, CRNA  Patient location: Pre-op. Preanesthetic checklist: patient identified, IV checked, site marked, risks and benefits discussed, surgical consent, monitors and equipment checked, pre-op evaluation, timeout performed and anesthesia consent Lidocaine 1% used for infiltration Right, radial was placed Catheter size: 20 G Hand hygiene performed , maximum sterile barriers used  and Seldinger technique used Allen's test indicative of satisfactory collateral circulation Attempts: 1 Procedure performed without using ultrasound guided technique. Following insertion, dressing applied and Biopatch. Post procedure assessment: normal  Patient tolerated the procedure well with no immediate complications.

## 2019-04-29 NOTE — Anesthesia Procedure Notes (Signed)
Procedure Name: Intubation Date/Time: 04/29/2019 4:56 PM Performed by: Moshe Salisbury, CRNA Pre-anesthesia Checklist: Patient identified, Emergency Drugs available, Suction available and Patient being monitored Patient Re-evaluated:Patient Re-evaluated prior to induction Oxygen Delivery Method: Circle System Utilized Preoxygenation: Pre-oxygenation with 100% oxygen Induction Type: IV induction Ventilation: Mask ventilation without difficulty Laryngoscope Size: Mac and 3 Grade View: Grade I Tube type: Oral Tube size: 7.0 mm Number of attempts: 1 Airway Equipment and Method: Stylet and Oral airway Placement Confirmation: ETT inserted through vocal cords under direct vision,  positive ETCO2 and breath sounds checked- equal and bilateral Secured at: 21 cm Tube secured with: Tape Dental Injury: Teeth and Oropharynx as per pre-operative assessment

## 2019-04-29 NOTE — Progress Notes (Signed)
   ENT Progress Note: POD #14 s/p Procedure(s): Revision Endoscopic Pituitary resection   Subjective: H/A  Objective: Vital signs in last 24 hours: Temp:  [97.8 F (36.6 C)-98.1 F (36.7 C)] 98.1 F (36.7 C) (08/21 0400) Pulse Rate:  [84-94] 87 (08/21 1200) Resp:  [11-20] 15 (08/21 1200) BP: (104-132)/(54-101) 122/72 (08/21 1200) SpO2:  [92 %-100 %] 100 % (08/21 1200) Weight change:  Last BM Date: 04/22/19  Intake/Output from previous day: 08/20 0701 - 08/21 0700 In: 1167.5 [I.V.:1167.5] Out: 950 [Urine:950] Intake/Output this shift: Total I/O In: 375.3 [I.V.:375.3] Out: 500 [Urine:500]  Labs: No results for input(s): WBC, HGB, HCT, PLT in the last 72 hours. Recent Labs    04/28/19 0637 04/29/19 0350  NA 142 141  K 4.6 4.5  CL 113* 113*  CO2 18* 16*  GLUCOSE 166* 132*  BUN 42* 41*  CALCIUM 9.7 9.6    Studies/Results: No results found.   PHYSICAL EXAM: Nasal passage stable No bleeding or infection   Assessment/Plan: Plan endoscopic evaluation and CSF leak repair this pm    Jerrell Belfast 04/29/2019, 1:03 PM

## 2019-04-29 NOTE — Brief Op Note (Signed)
04/15/2019 - 04/29/2019  7:19 PM  PATIENT:  [name]  63 y.o. female  PRE-OPERATIVE DIAGNOSIS:  PITUITARY ADENOMA  POST-OPERATIVE DIAGNOSIS:  PITUITARY ADENOMA  PROCEDURE:  Procedure(s): Endoscopic Endonasal Cerebrospinal Fluid Leak repair, Abdominal wound revision and fat graft harvest, Intrathecal Fluorocein Injection (N/A) Endoscopic repair of spinal fluid leak  (N/A)  SURGEON:  Surgeon(s) and Role: Panel 1:    * Judith Part, MD - Primary Panel 2:    Jerrell Belfast, MD - Primary  PHYSICIAN ASSISTANT:   ANESTHESIA:   general  EBL:  100 mL   BLOOD ADMINISTERED:none  DRAINS: none   LOCAL MEDICATIONS USED:  LIDOCAINE   SPECIMEN:  No Specimen  DISPOSITION OF SPECIMEN:  N/A  COUNTS:  YES  TOURNIQUET:  * No tourniquets in log *  DICTATION: .Note written in EPIC  PLAN OF CARE: Admit to inpatient   PATIENT DISPOSITION:  PACU - hemodynamically stable.   Delay start of Pharmacological VTE agent (>24hrs) due to surgical blood loss or risk of bleeding: yes

## 2019-04-29 NOTE — Anesthesia Preprocedure Evaluation (Signed)
Anesthesia Evaluation  Patient identified by MRN, date of birth, ID band Patient awake    Reviewed: Allergy & Precautions, NPO status , Patient's Chart, lab work & pertinent test results  Airway Mallampati: III  TM Distance: >3 FB Neck ROM: Full    Dental  (+) Teeth Intact, Dental Advisory Given   Pulmonary former smoker,    breath sounds clear to auscultation       Cardiovascular hypertension,  Rhythm:Regular Rate:Normal     Neuro/Psych    GI/Hepatic   Endo/Other  diabetes  Renal/GU      Musculoskeletal   Abdominal   Peds  Hematology   Anesthesia Other Findings   Reproductive/Obstetrics                             Anesthesia Physical Anesthesia Plan  ASA: III  Anesthesia Plan: General   Post-op Pain Management:    Induction: Intravenous  PONV Risk Score and Plan: Ondansetron  Airway Management Planned: Oral ETT  Additional Equipment: Arterial line and CVP  Intra-op Plan:   Post-operative Plan: Extubation in OR  Informed Consent: I have reviewed the patients History and Physical, chart, labs and discussed the procedure including the risks, benefits and alternatives for the proposed anesthesia with the patient or authorized representative who has indicated his/her understanding and acceptance.     Dental advisory given  Plan Discussed with: CRNA and Anesthesiologist  Anesthesia Plan Comments:         Anesthesia Quick Evaluation

## 2019-04-29 NOTE — Progress Notes (Addendum)
Neurosurgery Service Post-operative progress note  Assessment & Plan: 63 y.o. woman with CSF leak after repeat pituitary adenoma resection, now s/p repeat endonasal skull base CSF leak repair with abdominal wound revision.  -on cephalexin, f/u abdominal wound cultures from intra-op -restart lumbar drain at 10cc/hour, fluorescein used intra-op, expect a bright yellow or or orange tint to CSF -ct head tonight for baseline to evaluate pneumocephalus -will drain x72 hours -qd RFP for renal function -if pneumocephalus worsens while draining, may require intubation x72h to allow flap to heal  Judith Part  04/29/19 7:21 PM

## 2019-04-29 NOTE — Progress Notes (Signed)
Telephone consent for sugery obtained from sister, Ollen Bowl, with Dr. Zada Finders and Dr. Linna Caprice with anesthesia.  2 nurse witness

## 2019-04-29 NOTE — Progress Notes (Signed)
Neurosurgery Service Progress Note  Subjective: No acute events overnight, mental status improving as pneumocephalus resolves  Objective: Vitals:   04/29/19 0600 04/29/19 0700 04/29/19 0800 04/29/19 0900  BP: (!) 111/59 112/71 (!) 117/59 132/74  Pulse:  94 85 91  Resp: 14 19 16 14   Temp:      TempSrc:      SpO2:  100% 100% 100%  Weight:      Height:       Temp (24hrs), Avg:98 F (36.7 C), Min:97.8 F (36.6 C), Max:98.1 F (36.7 C)  CBC Latest Ref Rng & Units 04/23/2019 04/17/2019 04/15/2019  WBC 4.0 - 10.5 K/uL 7.5 12.2(H) 8.7  Hemoglobin 12.0 - 15.0 g/dL 7.2(L) 8.8(L) 9.2(L)  Hematocrit 36.0 - 46.0 % 23.3(L) 28.7(L) 29.7(L)  Platelets 150 - 400 K/uL 253 230 229   BMP Latest Ref Rng & Units 04/29/2019 04/28/2019 04/27/2019  Glucose 70 - 99 mg/dL 132(H) 166(H) 155(H)  BUN 8 - 23 mg/dL 41(H) 42(H) 34(H)  Creatinine 0.44 - 1.00 mg/dL 1.63(H) 1.91(H) 1.40(H)  Sodium 135 - 145 mmol/L 141 142 140  Potassium 3.5 - 5.1 mmol/L 4.5 4.6 4.9  Chloride 98 - 111 mmol/L 113(H) 113(H) 112(H)  CO2 22 - 32 mmol/L 16(L) 18(L) 17(L)  Calcium 8.9 - 10.3 mg/dL 9.6 9.7 9.9    Intake/Output Summary (Last 24 hours) at 04/29/2019 0930 Last data filed at 04/29/2019 0900 Gross per 24 hour  Intake 1317.62 ml  Output 1050 ml  Net 267.62 ml    Current Facility-Administered Medications:  .  0.9 %  sodium chloride infusion, 250 mL, Intravenous, Continuous, Eustace Moore, MD .  0.9 % NaCl with KCl 20 mEq/ L  infusion, , Intravenous, Continuous, Eustace Moore, MD, Last Rate: 75 mL/hr at 04/29/19 0900 .  acetaminophen (TYLENOL) tablet 650 mg, 650 mg, Oral, Q4H PRN, 650 mg at 04/26/19 1330 **OR** acetaminophen (TYLENOL) suppository 650 mg, 650 mg, Rectal, Q4H PRN, Eustace Moore, MD .  amLODipine (NORVASC) tablet 10 mg, 10 mg, Oral, Daily, Eustace Moore, MD, Stopped at 04/29/19 0915 .  atorvastatin (LIPITOR) tablet 40 mg, 40 mg, Oral, Daily, Eustace Moore, MD, 40 mg at 04/28/19 1743 .  cephALEXin (KEFLEX)  capsule 500 mg, 500 mg, Oral, Q8H, Eustace Moore, MD, 500 mg at 04/29/19 0541 .  docusate sodium (COLACE) capsule 100 mg, 100 mg, Oral, BID, Eustace Moore, MD, Stopped at 04/29/19 [phone] .  gabapentin (NEURONTIN) capsule 300 mg, 300 mg, Oral, TID, Eustace Moore, MD, Stopped at 04/29/19 [phone] .  Gerhardt's butt cream, , Topical, BID, ,  A, MD .  heparin injection 5,000 Units, 5,000 Units, Subcutaneous, Q8H, Judith Part, MD, 5,000 Units at 04/29/19 0541 .  [DISCONTINUED] lisinopril (ZESTRIL) tablet 40 mg, 40 mg, Oral, Daily, 40 mg at 04/23/19 1018 **AND** hydrochlorothiazide (HYDRODIURIL) tablet 25 mg, 25 mg, Oral, Daily, Eustace Moore, MD, Stopped at 04/29/19 [phone] .  HYDROcodone-acetaminophen (NORCO/VICODIN) 5-325 MG per tablet 1 tablet, 1 tablet, Oral, Q4H PRN, Eustace Moore, MD, 1 tablet at 04/26/19 0734 .  HYDROmorphone (DILAUDID) injection 0.5 mg, 0.5 mg, Intravenous, Q3H PRN, Eustace Moore, MD, 0.5 mg at 04/26/19 0939 .  insulin aspart (novoLOG) injection 0-20 Units, 0-20 Units, Subcutaneous, TID WC, Judith Part, MD, 3 Units at 04/28/19 1207 .  insulin aspart (novoLOG) injection 0-5 Units, 0-5 Units, Subcutaneous, QHS, ,  A, MD .  iron polysaccharides (NIFEREX) capsule 150 mg, 150 mg, Oral, BID, Sherley Bounds  S, MD, Stopped at 04/29/19 0917 .  labetalol (NORMODYNE) injection 10-40 mg, 10-40 mg, Intravenous, Q10 min PRN, Eustace Moore, MD, 10 mg at 04/15/19 1235 .  lidocaine (PF) (XYLOCAINE) 1 % injection, , , PRN, Jacqulynn Cadet, MD, 10 mL at 04/19/19 1436 .  metFORMIN (GLUCOPHAGE) tablet 500 mg, 500 mg, Oral, BID WC, Eustace Moore, MD, Stopped at 04/29/19 [phone] .  ondansetron (ZOFRAN) tablet 4 mg, 4 mg, Oral, Q4H PRN **OR** ondansetron (ZOFRAN) injection 4 mg, 4 mg, Intravenous, Q4H PRN, Eustace Moore, MD .  ondansetron [facility]) tablet 4 mg, 4 mg, Oral, Q6H PRN **OR** ondansetron (ZOFRAN) injection 4 mg, 4 mg, Intravenous, Q6H PRN, Eustace Moore, MD .  polyethylene glycol (MIRALAX / GLYCOLAX) packet 17 g, 17 g, Oral, Daily PRN, Eustace Moore, MD .  promethazine (PHENERGAN) tablet 12.5-25 mg, 12.5-25 mg, Oral, Q4H PRN, Eustace Moore, MD .  [facility]) tablet 8.6 mg, 1 tablet, Oral, BID, Eustace Moore, MD, Stopped at 04/29/19 [phone] .  sodium chloride flush (NS) 0.9 % injection 3 mL, 3 mL, Intravenous, Q12H, Eustace Moore, MD, 3 mL at 04/28/19 2258 .  sodium chloride flush (NS) 0.9 % injection 3 mL, 3 mL, Intravenous, PRN, Eustace Moore, MD .  tiZANidine (ZANAFLEX) tablet 4 mg, 4 mg, Oral, Q8H PRN, Eustace Moore, MD, 4 mg at 04/20/19 1554   Physical Exam: Awake, Ox2, PERRL, EOMI, FS, Strength 5/5 x4, SILTx4, VFF with mild bitemporal hemianopsia No rhinorrhea on exam this morning Abdominal fat graft incision with some dehiscence and purulence  Assessment & Plan: 64 y.o. 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 Failed bedside lumbar drain placement. 8/11 LD placed under fluoro, minimal output, 8/14 LD removed; 8/16 rhinorrhea, LD replaced, 8/19 somnolence, CTH w/ large volume pneumocephalus, LD clamped  -keep LD clamped -will need to go to the OR today for repeat skull base reconstruction -will get sterile wound cultures in the OR and start on ABx post-op for fat graft incision wound infection -continue IVF -continue holding lisinopril and glyburide -cont qd RFP -SCDs/TEDs/SQH -pt's family updated  Judith Part  04/29/19 9:30 AM

## 2019-04-30 ENCOUNTER — Inpatient Hospital Stay (HOSPITAL_COMMUNITY): Payer: BC Managed Care – PPO

## 2019-04-30 LAB — RENAL FUNCTION PANEL
Albumin: 3.6 g/dL (ref 3.5–5.0)
Anion gap: 12 (ref 5–15)
BUN: 33 mg/dL — ABNORMAL HIGH (ref 8–23)
CO2: 14 mmol/L — ABNORMAL LOW (ref 22–32)
Calcium: 9.5 mg/dL (ref 8.9–10.3)
Chloride: 118 mmol/L — ABNORMAL HIGH (ref 98–111)
Creatinine, Ser: 1.54 mg/dL — ABNORMAL HIGH (ref 0.44–1.00)
GFR calc Af Amer: 42 mL/min — ABNORMAL LOW (ref >60)
GFR calc non Af Amer: 36 mL/min — ABNORMAL LOW (ref >60)
Glucose, Bld: 214 mg/dL — ABNORMAL HIGH (ref 70–99)
Phosphorus: 3.7 mg/dL (ref 2.5–4.6)
Potassium: 6.1 mmol/L — ABNORMAL HIGH (ref 3.5–5.1)
Sodium: 144 mmol/L (ref 135–145)

## 2019-04-30 LAB — GLUCOSE, CAPILLARY
Glucose-Capillary: 168 mg/dL — ABNORMAL HIGH (ref 70–99)
Glucose-Capillary: 135 mg/dL — ABNORMAL HIGH (ref 70–99)
Glucose-Capillary: 109 mg/dL — ABNORMAL HIGH (ref 70–99)
Glucose-Capillary: 122 mg/dL — ABNORMAL HIGH (ref 70–99)

## 2019-04-30 LAB — POTASSIUM: Potassium: 4.5 mmol/L (ref 3.5–5.1)

## 2019-04-30 MED ORDER — SODIUM CHLORIDE 0.9 % IV SOLN
INTRAVENOUS | Status: DC
  Administered 2019-04-30 – 2019-05-01 (×2): via INTRAVENOUS

## 2019-04-30 MED ORDER — SODIUM POLYSTYRENE SULFONATE 15 GM/60ML PO SUSP
15.0000 g | Freq: Once | ORAL | Status: AC
  Administered 2019-04-30: 15 g via ORAL
  Filled 2019-04-30: qty 60

## 2019-04-30 NOTE — Progress Notes (Signed)
Subjective:    Patient ID: [name], female    DOB: 01/05/1956, 63 y.o.   MRN: [id number]  Subjective:Patient doing well with no complaints awake and alert headache better Interval History: none.  Objective: Vital signs in last 24 hours: Temp:  (97.8 F (36.6 C)-99.3 F (37.4 C)) 99 F (37.2 C) (08/22 0400) Pulse Rate:  (78-92) 82 (08/22 0700) Resp:  (0-22) 11 (08/22 0700) BP: (104-155)/(64-91) 123/73 (08/22 0700) SpO2:  (99 %-100 %) 100 % (08/22 0700) Arterial Line BP: (141-177)/(58-84) 148/84 (08/22 0015)  Intake/Output from previous day: 08/21 0701 - 08/22 0700 In: 2536.7 (I.V.:2536.7) Out: 3320 (Urine:3100; Drains:120) Intake/Output this shift: No intake/output data recorded.  Normal moves all extremities well Patient doing well awake and alert oriented moves all extremities well Results for orders placed or performed during the hospital encounter of 04/15/19 (from the past 24 hour(s)) -Glucose, capillary     Status: Abnormal Collection Time: 04/29/19 12:00 PM      Result                                            Value                         Ref Range                      Glucose-Capillary                                 123 (H)                       70 - 99 mg/dL             -Surgical pcr screen     Status: None Collection Time: 04/29/19 12:34 PM Specimen: Nasal Mucosa; Nasal Swab      Result                                            Value                         Ref Range                      MRSA, PCR                                         NEGATIVE                      NEGATIVE                       Staphylococcus aureus                             NEGATIVE                      NEGATIVE                  -  Glucose, capillary     Status: Abnormal Collection Time: 04/29/19  1:56 PM      Result                                            Value                         Ref Range                      Glucose-Capillary                                 112 (H)                        70 - 99 mg/dL             -Glucose, capillary     Status: Abnormal Collection Time: 04/29/19  4:13 PM      Result                                            Value                         Ref Range                      Glucose-Capillary                                 110 (H)                       70 - 99 mg/dL             -Type and screen     Status: None Collection Time: 04/29/19  5:38 PM      Result                                            Value                         Ref Range                      ABO/RH(D)                                         A POS                                                        Antibody Screen  NEG                                                          Sample Expiration                                                                                          05/02/2019,2359 Performed at [facility], [address] [place], [place] [address]  -Aerobic/Anaerobic Culture (surgical/deep wound)     Status: None (Preliminary result) Collection Time: 04/29/19  6:00 PM Specimen: Wound      Result                                            Value                         Ref Range                      Specimen Description                              WOUND                                                        Special Requests                                  PT ON KEFLEX                                                 Gram Stain                                                                                                 FEW WBC PRESENT,BOTH PMN AND MONONUCLEAR FEW GRAM VARIABLE ROD Performed at [facility], [address], [place], [place] [address]       Culture  PENDING                                                      Report Status                                     PENDING                                                 -Glucose, capillary     Status:  Abnormal Collection Time: 04/29/19  7:49 PM      Result                                            Value                         Ref Range                      Glucose-Capillary                                 186 (H)                       70 - 99 mg/dL                  Comment 1                                         Notify RN                                               -Glucose, capillary     Status: Abnormal Collection Time: 04/29/19 10:24 PM      Result                                            Value                         Ref Range                      Glucose-Capillary                                 278 (H)                       70 - 99 mg/dL             -  Renal function panel     Status: Abnormal Collection Time: 04/30/19  5:38 AM      Result                                            Value                         Ref Range                      Sodium                                            144                           135 - 145 mmol/L               Potassium                                         6.1 (H)                       3.5 - 5.1 mmol/L               Chloride                                          118 (H)                       98 - 111 mmol/L                CO2                                               14 (L)                        22 - 32 mmol/L                 Glucose, Bld                                      214 (H)                       70 - 99 mg/dL                  BUN                                               33 (H)  8 - 23 mg/dL                   Creatinine, Ser                                   1.54 (H)                      0.44 - 1.00 mg/dL              Calcium                                           9.5                           8.9 - 10.3 mg/dL               Phosphorus                                        3.7                           2.5 - 4.6 mg/dL                Albumin                                           3.6                            3.5 - 5.0 g/dL                 GFR calc non Af Amer                              36 (L)                        >60 mL/min                     GFR calc Af Amer                                  42 (L)                        >60 mL/min                     Anion gap                                         12                            5 -  15                    -Glucose, capillary     Status: Abnormal Collection Time: 04/30/19  7:22 AM      Result                                            Value                         Ref Range                      Glucose-Capillary                                 168 (H)                       70 - 99 mg/dL                  Comment 1                                         Notify RN                                                    Comment 2                                         Document in Chart                                        Studies/Results: Dg Lumbar Spine 2-3 Views  Result Date: 04/24/2019 CLINICAL DATA:  Lumbar drain placement. EXAM: LUMBAR SPINE - 2-3 VIEW; DG C-ARM 61-120 MIN COMPARISON:  Lumbar puncture 04/19/2019 FINDINGS: Two fluoroscopic images demonstrate a catheter probably at the L2-L3 level. There appears to be a small catheter extending cephalad. IMPRESSION: Fluoroscopic imaging for lumbar drain placement. Electronically Signed   By: Richarda Overlie M.D.   On: 04/24/2019 10:53   Ct Head Wo Contrast  Result Date: 04/30/2019 CLINICAL DATA:  Follow-up examination status post skull base reconstruction for CSF leak repair. EXAM: CT HEAD WITHOUT CONTRAST TECHNIQUE: Contiguous axial images were obtained from the base of the skull through the vertex without intravenous contrast. COMPARISON:  Prior CT from 04/27/2019. FINDINGS: Brain: There has been market interval improvement in previously seen extensive pneumocephalus status post interval skull base reconstruction for CSF leak repair. Few scattered foci of pneumocephalus again seen  clustered about the cavernous sinus and sella turcica. Small amount of pneumocephalus seen within the nondependent aspect of the frontal horns of both lateral ventricles as well as the left anterior temporal horn, markedly improved from previous. Underlying ventricular size is decreased with improved hydrocephalus. No acute intracranial hemorrhage. No acute large vessel territory infarct.  No mass lesion or midline shift. No extra-axial fluid collection. Underlying atrophy with mild chronic microvascular ischemic disease noted. Vascular: No hyperdense vessel. Calcified atherosclerosis present at skull base. Skull: Scalp soft tissues demonstrate no acute finding. Calvarium intact. Sinuses/Orbits: Globes and orbital soft tissues within normal limits. Fat graft present at the osseous defect at the anterior sella turcica, compatible with interval repair. Scattered air-fluid levels with mucosal thickening within the ethmoidal air cells, sphenoid sinuses, and left maxillary sinus. Nasal pledget remains in place on the right. Trace left mastoid effusion noted. Other: None. IMPRESSION: 1. Postoperative changes from interval skull base reconstruction for repair of CSF leak without adverse features. 2. Market interval improvement in previously seen pneumocephalus, with small volume residual pneumocephalus within the ventricular system and about the sella turcica. 3. No other new acute intracranial abnormality. Electronically Signed   By: Rise Mu M.D.   On: 04/30/2019 02:59   Ct Head Wo Contrast  Result Date: 04/27/2019 CLINICAL DATA:  Altered level of consciousness, unexplained. Patient very somnolent today, not following commands, can not stay awake. EXAM: CT HEAD WITHOUT CONTRAST TECHNIQUE: Contiguous axial images were obtained from the base of the skull through the vertex without intravenous contrast. COMPARISON:  Head CT 04/23/2019 FINDINGS: Brain: Since prior head CT 04/23/2019, there has been a marked  interval increase in pneumocephalus, now greatest within the right frontal extra-axial space and extending into the bilateral sylvian fissures. There is also a large amount of pneumocephalus distending the lateral and third ventricle and extending and into the basal cisterns. To a lesser degree, pneumocephalus also present within the fourth ventricle. Pneumocephalus exerts mass effect upon the underlying anterior right frontal lobe. No midline shift on the current exam. There is posterior displacement of the brainstem. Pneumocephalus also now present within the sella turcica. There is diffuse cerebral sulcal effacement. No acute intracranial hemorrhage. No appreciable demarcated territorial infarction. 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 Interval increase in aeration of the bilateral sphenoid sinuses. Layering air-fluid levels within the bilateral sphenoid sinuses. There is soft tissue density extending from the posterior ethmoid region through the anterior sellar osseous defect, which may reflect packing material. Partial opacification of bilateral ethmoid air cells. Vascular: No hyperdense vessel. Skull: No calvarial fracture. Sinuses/Orbits: Imaged globes and orbits demonstrate no acute abnormality. No significant mastoid effusion. These results were called by telephone at the time of interpretation on 04/27/2019 at 12:39 pm to Dr. Autumn Patty , who verbally acknowledged these results. IMPRESSION: Since head CT 04/23/2019, marked interval increase in now large volume pneumocephalus. This includes large volume intraventricular pneumocephalus distending the lateral and third ventricles. Mass effect upon the right frontal lobe, diffuse cerebral sulcal effacement and posterior displacement of the brainstem. Suspect the skull base defect from prior transsphenoidal pituitary lesion resection as the etiology for the  pneumocephalus. No midline shift. Electronically Signed   By: Jackey Loge   On: 04/27/2019 12:44   Ct Head Wo Contrast  Result Date: 04/23/2019 CLINICAL DATA:  Head trauma and possible CSF leak EXAM: CT HEAD WITHOUT CONTRAST TECHNIQUE: Contiguous axial images were obtained from the base of the skull through the vertex without intravenous contrast. COMPARISON:  03/16/2019 maxillofacial CT FINDINGS: Brain: There is scattered pneumocephalus at the base of the cerebrum. There is no intracranial hemorrhage. No focal parenchymal abnormality of the brain. No midline shift or mass effect. No hydrocephalus. Vascular: Atherosclerotic calcification of the internal carotid arteries at  the skull base. No abnormal hyperdensity of the major intracranial arteries or dural venous sinuses. 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 This is in direct continuity with the sphenoid and posterior ethmoid sinuses which are opacified. Sinuses/Orbits: Opacification of the sphenoid and posterior ethmoid sinuses. The orbits are normal. IMPRESSION: 1. 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 2. 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 Electronically Signed   By: Deatra Robinson M.D.   On: 04/23/2019 01:33   Dg C-arm 1-60 Min  Result Date: 04/24/2019 CLINICAL DATA:  Lumbar drain placement. EXAM: LUMBAR SPINE - 2-3 VIEW; DG C-ARM 61-120 MIN COMPARISON:  Lumbar puncture 04/19/2019 FINDINGS: Two fluoroscopic images demonstrate a catheter probably at the L2-L3 level. There appears to be a small catheter extending cephalad. IMPRESSION: Fluoroscopic imaging for lumbar drain placement. Electronically Signed   By: Richarda Overlie M.D.   On: 04/24/2019 10:53   Ir Image Guided Fluid Drain By Catheter  Result Date:  04/19/2019 CLINICAL DATA:  Lumbar CSF drain placement under fluoroscopy. Procedure performed in conjunction with Dr. Molli Hazard FLUOROSCOPY TIME:  8 minutes 42 seconds 543 mGy PROCEDURE: The procedure, risks, benefits, and alternatives were explained to the patient. Questions regarding the procedure were encouraged and answered. The patient understands and consents to the procedure. An interlaminar approach was performed on left at L4-L5. The overlying skin was cleansed and anesthetized. A 14 gauge Touhy needle was advanced using fluoroscopic guidance. The needle was successfully advanced into the subarachnoid space. Unfortunately, the lumbar drainage catheter and wire could not be advanced cephalad. 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 Therefore, the decision was made to react ses the subarachnoid space at the L3-L4 level. Again, and interlaminar approach was performed on the left at L3-L4. The 14 gauge Touhy needle was successfully advanced into the subarachnoid space under fluoroscopic guidance. With the assistance of a Nitrex wire, the lumbar drainage catheter was successfully advanced through the subarachnoid space and into the thoracic region. Images were obtained and stored. The introducer needle was removed. The catheter was then secured to the skin with a large Tegaderm bandage. Appropriate setup of the external portion of the drainage catheter was performed by Dr. Molli Hazard. There was no immediate complication. The patient tolerated the procedure well. COMPLICATIONS: None immediate IMPRESSION: Technically successful placement of a lumbar subarachnoid drainage catheter at the L3-L4 level using fluoroscopic guidance. Signed, Sterling Big, MD, RPVI [facility] Radiology Electronically Signed   By: Malachy Moan M.D.   On: 04/19/2019 17:20    Scheduled Meds:amLODipine, 10 mg,  Oral, Daily atorvastatin, 40 mg, Oral, Daily cephALEXin, 500 mg, Oral, Q8H docusate sodium, 100 mg, Oral, BID Fluorescein Sodium, 500 mg, Intravenous, Once gabapentin, 300 mg, Oral, TID Gerhardt's butt cream, , Topical, BID heparin injection (subcutaneous), 5,000 Units, Subcutaneous, Q8H hydrochlorothiazide, 25 mg, Oral, Daily insulin aspart, 0-20 Units, Subcutaneous, TID WC insulin aspart, 0-5 Units, Subcutaneous, QHS iron polysaccharides, 150 mg, Oral, BID metFORMIN, 500 mg, Oral, BID WC mupirocin ointment, 1 application, Nasal, BID senna, 1 tablet, Oral, BID sodium chloride flush, 3 mL, Intravenous, Q12H   Continuous Infusions:sodium chloride sodium chloride 0.9 % NaCl with KCl 20 mEq / L Last Rate: 75 mL/hr at 04/30/19 0700   PRN Meds:acetaminophen **OR** acetaminophen, HYDROcodone-acetaminophen, HYDROmorphone (  DILAUDID) injection, labetalol, lidocaine (PF), ondansetron **OR** ondansetron (ZOFRAN) IV, ondansetron **OR** ondansetron (ZOFRAN) IV, polyethylene glycol, promethazine, sodium chloride, sodium chloride flush, tiZANidine  Assessment/Plan:  Postop day 1 revision and repair re-postop CT scan shows significant provement intracranial area will open up lumbar drain at 10 cc an hour   LOS: 15 days   Mohmed Farver P      Review of Systems     Objective:   Physical Exam        Assessment & Plan:

## 2019-05-01 LAB — GLUCOSE, CAPILLARY
Glucose-Capillary: 133 mg/dL — ABNORMAL HIGH (ref 70–99)
Glucose-Capillary: 178 mg/dL — ABNORMAL HIGH (ref 70–99)
Glucose-Capillary: 128 mg/dL — ABNORMAL HIGH (ref 70–99)
Glucose-Capillary: 120 mg/dL — ABNORMAL HIGH (ref 70–99)

## 2019-05-01 LAB — BASIC METABOLIC PANEL
Anion gap: 12 (ref 5–15)
BUN: 24 mg/dL — ABNORMAL HIGH (ref 8–23)
CO2: 17 mmol/L — ABNORMAL LOW (ref 22–32)
Calcium: 9.3 mg/dL (ref 8.9–10.3)
Chloride: 114 mmol/L — ABNORMAL HIGH (ref 98–111)
Creatinine, Ser: 1.46 mg/dL — ABNORMAL HIGH (ref 0.44–1.00)
GFR calc Af Amer: 45 mL/min — ABNORMAL LOW (ref >60)
GFR calc non Af Amer: 38 mL/min — ABNORMAL LOW (ref >60)
Glucose, Bld: 129 mg/dL — ABNORMAL HIGH (ref 70–99)
Potassium: 4 mmol/L (ref 3.5–5.1)
Sodium: 143 mmol/L (ref 135–145)

## 2019-05-01 LAB — RENAL FUNCTION PANEL
Albumin: 3.6 g/dL (ref 3.5–5.0)
Anion gap: 12 (ref 5–15)
BUN: 26 mg/dL — ABNORMAL HIGH (ref 8–23)
CO2: 16 mmol/L — ABNORMAL LOW (ref 22–32)
Calcium: 9.8 mg/dL (ref 8.9–10.3)
Chloride: 121 mmol/L — ABNORMAL HIGH (ref 98–111)
Creatinine, Ser: 1.51 mg/dL — ABNORMAL HIGH (ref 0.44–1.00)
GFR calc Af Amer: 43 mL/min — ABNORMAL LOW (ref >60)
GFR calc non Af Amer: 37 mL/min — ABNORMAL LOW (ref >60)
Glucose, Bld: 159 mg/dL — ABNORMAL HIGH (ref 70–99)
Phosphorus: 3.1 mg/dL (ref 2.5–4.6)
Potassium: 4.3 mmol/L (ref 3.5–5.1)
Sodium: 149 mmol/L — ABNORMAL HIGH (ref 135–145)

## 2019-05-01 LAB — URINE CULTURE: Culture: 40000 — AB

## 2019-05-01 MED ORDER — SODIUM CHLORIDE 0.9 % IV SOLN
INTRAVENOUS | Status: DC | PRN
  Administered 2019-05-01 – 2019-05-16 (×2): 1000 mL via INTRAVENOUS
  Administered 2019-05-16: 20:00:00 500 mL via INTRAVENOUS
  Administered 2019-05-21: 250 mL via INTRAVENOUS

## 2019-05-01 NOTE — Progress Notes (Signed)
Subjective: Patient reports Patient doing well no significant headache  Objective: Vital signs in last 24 hours: Temp:  [97.9 F (36.6 C)-98.9 F (37.2 C)] 98 F (36.7 C) (08/23 0749) Pulse Rate:  [38-111] 100 (08/23 0749) Resp:  [11-28] 17 (08/23 0749) BP: (107-143)/(62-106) 121/71 (08/23 0700) SpO2:  [90 %-100 %] 90 % (08/23 0749)  Intake/Output from previous day: 08/22 0701 - 08/23 0700 In: 1211 [I.V.:1211] Out: 1565 [Urine:1325; Drains:240] Intake/Output this shift: No intake/output data recorded.  Awake alert oriented lumbar drain functioning well 240 cc out last 24 hours  Lab Results: No results for input(s): WBC, HGB, HCT, PLT in the last 72 hours. BMET Recent Labs    04/29/19 0350 04/30/19 0538 04/30/19 2033  NA 141 144  --   K 4.5 6.1* 4.5  CL 113* 118*  --   CO2 16* 14*  --   GLUCOSE 132* 214*  --   BUN 41* 33*  --   CREATININE 1.63* 1.54*  --   CALCIUM 9.6 9.5  --     Studies/Results: Ct Head Wo Contrast  Result Date: 04/30/2019 CLINICAL DATA:  Follow-up examination status post skull base reconstruction for CSF leak repair. EXAM: CT HEAD WITHOUT CONTRAST TECHNIQUE: Contiguous axial images were obtained from the base of the skull through the vertex without intravenous contrast. COMPARISON:  Prior CT from 04/27/2019. FINDINGS: Brain: There has been market interval improvement in previously seen extensive pneumocephalus status post interval skull base reconstruction for CSF leak repair. Few scattered foci of pneumocephalus again seen clustered about the cavernous sinus and sella turcica. Small amount of pneumocephalus seen within the nondependent aspect of the frontal horns of both lateral ventricles as well as the left anterior temporal horn, markedly improved from previous. Underlying ventricular size is decreased with improved hydrocephalus. No acute intracranial hemorrhage. No acute large vessel territory infarct. No mass lesion or midline shift. No extra-axial  fluid collection. Underlying atrophy with mild chronic microvascular ischemic disease noted. Vascular: No hyperdense vessel. Calcified atherosclerosis present at skull base. Skull: Scalp soft tissues demonstrate no acute finding. Calvarium intact. Sinuses/Orbits: Globes and orbital soft tissues within normal limits. Fat graft present at the osseous defect at the anterior sella turcica, compatible with interval repair. Scattered air-fluid levels with mucosal thickening within the ethmoidal air cells, sphenoid sinuses, and left maxillary sinus. Nasal pledget remains in place on the right. Trace left mastoid effusion noted. Other: None. IMPRESSION: 1. Postoperative changes from interval skull base reconstruction for repair of CSF leak without adverse features. 2. Market interval improvement in previously seen pneumocephalus, with small volume residual pneumocephalus within the ventricular system and about the sella turcica. 3. No other new acute intracranial abnormality. Electronically Signed   By: Jeannine Boga M.D.   On: 04/30/2019 02:59    Assessment/Plan: Continue lumbar drainage duration per Dr. Venetia Constable  LOS: 16 days     [name] P 05/01/2019, 7:59 AM

## 2019-05-02 ENCOUNTER — Encounter (HOSPITAL_COMMUNITY): Payer: Self-pay | Admitting: Neurological Surgery

## 2019-05-02 LAB — GLUCOSE, CAPILLARY
Glucose-Capillary: 146 mg/dL — ABNORMAL HIGH (ref 70–99)
Glucose-Capillary: 216 mg/dL — ABNORMAL HIGH (ref 70–99)
Glucose-Capillary: 87 mg/dL (ref 70–99)
Glucose-Capillary: 172 mg/dL — ABNORMAL HIGH (ref 70–99)

## 2019-05-02 LAB — RENAL FUNCTION PANEL
Albumin: 3.2 g/dL — ABNORMAL LOW (ref 3.5–5.0)
Anion gap: 12 (ref 5–15)
BUN: 23 mg/dL (ref 8–23)
CO2: 17 mmol/L — ABNORMAL LOW (ref 22–32)
Calcium: 9.3 mg/dL (ref 8.9–10.3)
Chloride: 112 mmol/L — ABNORMAL HIGH (ref 98–111)
Creatinine, Ser: 1.7 mg/dL — ABNORMAL HIGH (ref 0.44–1.00)
GFR calc Af Amer: 37 mL/min — ABNORMAL LOW (ref >60)
GFR calc non Af Amer: 32 mL/min — ABNORMAL LOW (ref >60)
Glucose, Bld: 134 mg/dL — ABNORMAL HIGH (ref 70–99)
Phosphorus: 3.8 mg/dL (ref 2.5–4.6)
Potassium: 4.4 mmol/L (ref 3.5–5.1)
Sodium: 141 mmol/L (ref 135–145)

## 2019-05-02 MED ORDER — CHLORHEXIDINE GLUCONATE CLOTH 2 % EX PADS
6.0000 | MEDICATED_PAD | Freq: Every day | CUTANEOUS | Status: DC
  Administered 2019-05-02 – 2019-06-07 (×33): 6 via TOPICAL

## 2019-05-02 NOTE — Progress Notes (Signed)
While sitting up to eat dinner, pt called out and told RN that she had drainage from her nose. Drainage is yellow/clear thin liquid. Called Dr. Zada Finders, verbal order to drain 15cc/hr if pt can tolerate the increase in amount drained. Will notify on-call neurosurgery tonight if there are any neuro changes.

## 2019-05-02 NOTE — Progress Notes (Signed)
Neurosurgery Service Progress Note  Subjective: No acute events overnight, no yellow drainage from nares, feeling closer to mental baseline, no complaints  Objective: Vitals:   05/02/19 0434 05/02/19 0500 05/02/19 0600 05/02/19 0700  BP:  121/66 121/64 125/67  Pulse:  85 89 88  Resp:  13 17 16   Temp: 99.7 F (37.6 C)     TempSrc: Oral     SpO2:  98% 99% 100%  Weight:      Height:       Temp (24hrs), Avg:98.3 F (36.8 C), Min:98 F (36.7 C), Max:99.7 F (37.6 C)  CBC Latest Ref Rng & Units 04/23/2019 04/17/2019 04/15/2019  WBC 4.0 - 10.5 K/uL 7.5 12.2(H) 8.7  Hemoglobin 12.0 - 15.0 g/dL 7.2(L) 8.8(L) 9.2(L)  Hematocrit 36.0 - 46.0 % 23.3(L) 28.7(L) 29.7(L)  Platelets 150 - 400 K/uL 253 230 229   BMP Latest Ref Rng & Units 05/02/2019 05/01/2019 05/01/2019  Glucose 70 - 99 mg/dL 134(H) 129(H) 159(H)  BUN 8 - 23 mg/dL 23 24(H) 26(H)  Creatinine 0.44 - 1.00 mg/dL 1.70(H) 1.46(H) 1.51(H)  Sodium 135 - 145 mmol/L 141 143 149(H)  Potassium 3.5 - 5.1 mmol/L 4.4 4.0 4.3  Chloride 98 - 111 mmol/L 112(H) 114(H) 121(H)  CO2 22 - 32 mmol/L 17(L) 17(L) 16(L)  Calcium 8.9 - 10.3 mg/dL 9.3 9.3 9.8    Intake/Output Summary (Last 24 hours) at 05/02/2019 0733 Last data filed at 05/02/2019 0700 Gross per 24 hour  Intake 439.74 ml  Output 1030 ml  Net -590.26 ml    Current Facility-Administered Medications:  .  0.9 %  sodium chloride infusion, , Intravenous, PRN, Meyran, Ocie Cornfield, NP, Stopped at 05/01/19 1710 .  acetaminophen (TYLENOL) tablet 650 mg, 650 mg, Oral, Q4H PRN, 650 mg at 04/26/19 1330 **OR** acetaminophen (TYLENOL) suppository 650 mg, 650 mg, Rectal, Q4H PRN, Judith Part, MD .  amLODipine (NORVASC) tablet 10 mg, 10 mg, Oral, Daily, Bella Brummet A, MD, 10 mg at 05/01/19 1026 .  atorvastatin (LIPITOR) tablet 40 mg, 40 mg, Oral, Daily, Judith Part, MD, 40 mg at 05/01/19 1701 .  docusate sodium (COLACE) capsule 100 mg, 100 mg, Oral, BID, Judith Part,  MD, 100 mg at 05/01/19 2118 .  Fluorescein Sodium 10 % injection 500 mg, 500 mg, Intravenous, Once, Bellamia Ferch A, MD .  gabapentin (NEURONTIN) capsule 300 mg, 300 mg, Oral, TID, Eustace Moore, MD, 300 mg at 05/01/19 2118 .  Gerhardt's butt cream, , Topical, BID, Manika Hast A, MD .  heparin injection 5,000 Units, 5,000 Units, Subcutaneous, Q8H, Judith Part, MD, 5,000 Units at 05/02/19 [phone]  [DISCONTINUED] lisinopril (ZESTRIL) tablet 40 mg, 40 mg, Oral, Daily, 40 mg at 04/23/19 1018 **AND** hydrochlorothiazide (HYDRODIURIL) tablet 25 mg, 25 mg, Oral, Daily, Rohen Kimes A, MD, 25 mg at 05/01/19 1025 .  HYDROcodone-acetaminophen (NORCO/VICODIN) 5-325 MG per tablet 1 tablet, 1 tablet, Oral, Q4H PRN, Judith Part, MD, 1 tablet at 04/26/19 0734 .  HYDROmorphone (DILAUDID) injection 0.5 mg, 0.5 mg, Intravenous, Q3H PRN, Judith Part, MD, 0.5 mg at 04/26/19 0939 .  insulin aspart (novoLOG) injection 0-20 Units, 0-20 Units, Subcutaneous, TID WC, Judith Part, MD, 3 Units at 05/01/19 1701 .  insulin aspart (novoLOG) injection 0-5 Units, 0-5 Units, Subcutaneous, QHS, Judith Part, MD, 3 Units at 04/29/19 2229 .  iron polysaccharides (NIFEREX) capsule 150 mg, 150 mg, Oral, BID, Judith Part, MD, 150 mg at 05/01/19 2118 .  labetalol (  NORMODYNE) injection 10-40 mg, 10-40 mg, Intravenous, Q10 min PRN, Judith Part, MD, 10 mg at 04/15/19 1235 .  lidocaine (PF) (XYLOCAINE) 1 % injection, , , PRN, Jacqulynn Cadet, MD, 10 mL at 04/19/19 1436 .  metFORMIN (GLUCOPHAGE) tablet 500 mg, 500 mg, Oral, BID WC, Dinora Hemm, Joyice Faster, MD, 500 mg at 05/01/19 1701 .  ondansetron (ZOFRAN) tablet 4 mg, 4 mg, Oral, Q4H PRN, 4 mg at 04/30/19 1043 **OR** ondansetron (ZOFRAN) injection 4 mg, 4 mg, Intravenous, Q4H PRN, Gregori Abril A, MD .  ondansetron (ZOFRAN) tablet 4 mg, 4 mg, Oral, Q6H PRN **OR** ondansetron (ZOFRAN) injection 4 mg, 4 mg, Intravenous, Q6H  PRN, Renatta Shrieves A, MD .  polyethylene glycol (MIRALAX / GLYCOLAX) packet 17 g, 17 g, Oral, Daily PRN, Judith Part, MD .  promethazine (PHENERGAN) tablet 12.5-25 mg, 12.5-25 mg, Oral, Q4H PRN, Judith Part, MD .  senna (SENOKOT) tablet 8.6 mg, 1 tablet, Oral, BID, Judith Part, MD, 8.6 mg at 05/01/19 2119 .  sodium chloride (OCEAN) 0.65 % nasal spray 4 spray, 4 spray, Each Nare, PRN, Jerrell Belfast, MD .  tiZANidine (ZANAFLEX) tablet 4 mg, 4 mg, Oral, Q8H PRN, Judith Part, MD, 4 mg at 04/20/19 1554   Physical Exam: Awake, Ox2, PERRL, EOMI, FS, Strength 5/5 x4, SILTx4, VFF with mild bitemporal hemianopsia No rhinorrhea on exam this morning Abdominal fat graft incision with some dehiscence and purulence  Assessment & Plan: 63 y.o. 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 Failed bedside lumbar drain placement. 8/11 LD placed under fluoro, minimal output, 8/14 LD removed; 8/16 rhinorrhea, LD replaced, 8/19 somnolence, CTH w/ large volume pneumocephalus, LD clamped, 8/21 s/p repeat endonasal skull base reconstruction  -continue LD @ 10cc/hr, currently day 3 of 5, will clamp drain on 8/27 -OR Cx with minimal growth, likely fat necrosis -cont qd RFPs, BUN normalized, Cr near baseline -continue holding lisinopril and glyburide -cont qd RFP -SCDs/TEDs/SQH  Judith Part  05/02/19 7:33 AM

## 2019-05-03 ENCOUNTER — Inpatient Hospital Stay (HOSPITAL_COMMUNITY): Payer: BC Managed Care – PPO

## 2019-05-03 LAB — GLUCOSE, CAPILLARY
Glucose-Capillary: 141 mg/dL — ABNORMAL HIGH (ref 70–99)
Glucose-Capillary: 126 mg/dL — ABNORMAL HIGH (ref 70–99)
Glucose-Capillary: 179 mg/dL — ABNORMAL HIGH (ref 70–99)
Glucose-Capillary: 120 mg/dL — ABNORMAL HIGH (ref 70–99)

## 2019-05-03 LAB — RENAL FUNCTION PANEL
Albumin: 3.3 g/dL — ABNORMAL LOW (ref 3.5–5.0)
Anion gap: 11 (ref 5–15)
BUN: 23 mg/dL (ref 8–23)
CO2: 17 mmol/L — ABNORMAL LOW (ref 22–32)
Calcium: 9 mg/dL (ref 8.9–10.3)
Chloride: 109 mmol/L (ref 98–111)
Creatinine, Ser: 1.46 mg/dL — ABNORMAL HIGH (ref 0.44–1.00)
GFR calc Af Amer: 45 mL/min — ABNORMAL LOW (ref >60)
GFR calc non Af Amer: 38 mL/min — ABNORMAL LOW (ref >60)
Glucose, Bld: 148 mg/dL — ABNORMAL HIGH (ref 70–99)
Phosphorus: 3.9 mg/dL (ref 2.5–4.6)
Potassium: 3.8 mmol/L (ref 3.5–5.1)
Sodium: 137 mmol/L (ref 135–145)

## 2019-05-03 MED ORDER — ENSURE ENLIVE PO LIQD
237.0000 mL | Freq: Two times a day (BID) | ORAL | Status: DC
  Administered 2019-05-03 – 2019-05-24 (×27): 237 mL via ORAL

## 2019-05-03 NOTE — Progress Notes (Signed)
Initial Nutrition Assessment  DOCUMENTATION CODES:   Obesity unspecified  INTERVENTION:   Ensure Enlive po BID, each supplement provides 350 kcal and 20 grams of protein  Consider liberalizing diet    NUTRITION DIAGNOSIS:   Inadequate oral intake related to decreased appetite as evidenced by meal completion < 50%.  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  Pt with CSF leak s/p multi-layered repair but developed post-op CSF rhinorrhea.   Per pt she reports that she had no unintentional weight loss PTA and had a good appetite. Per pt her appetite is now decreased. During rounds RN notified RD that PO intake has been decreased. Pt lying flat, remains on bedrest.  Pt discussed during ICU rounds and with RN.   Medications reviewed and include: colace, Glucophage, senna Labs reviewed    NUTRITION - FOCUSED PHYSICAL EXAM:    Most Recent Value  Orbital Region  No depletion  Upper Arm Region  No depletion  Thoracic and Lumbar Region  No depletion  Buccal Region  No depletion  Temple Region  No depletion  Clavicle Bone Region  No depletion  Clavicle and Acromion Bone Region  No depletion  Scapular Bone Region  Unable to assess  Dorsal Hand  No depletion  Patellar Region  No depletion  Anterior Thigh Region  No depletion  Posterior Calf Region  No depletion  Edema (RD Assessment)  None  Hair  Reviewed  Eyes  Reviewed  Mouth  Reviewed  Skin  Reviewed  Nails  Reviewed       Diet Order:   Diet Order            Diet Heart Room service appropriate? Yes with Assist; Fluid consistency: Thin  Diet effective now              EDUCATION NEEDS:   Education needs have been  addressed  Skin:  Skin Assessment: Reviewed RN Assessment(MASD: thigh)  Last BM:  8/22  Height:   Ht Readings from Last 1 Encounters:  04/24/19 5\' 6"  (1.676 m)    Weight:   Wt Readings from Last 1 Encounters:  04/24/19 107.5 kg    Ideal Body Weight:  59 kg  BMI:  Body mass index is 38.25 kg/m.  Estimated Nutritional Needs:   Kcal:  2000-2200  Protein:  100-120 grams  Fluid:  > 2 L/day  Maylon Peppers RD, LDN, CNSC [phone] Pager [phone] After Hours Pager

## 2019-05-03 NOTE — Progress Notes (Signed)
Neurosurgery Service Progress Note  Subjective: No acute events overnight, had one episode of drainage last night, increased LD to 15/hr, none since  Objective: Vitals:   05/03/19 0100 05/03/19 0200 05/03/19 0300 05/03/19 0400  BP: 119/80 120/69 116/66 119/69  Pulse: 89 88 88 88  Resp: 16 17 16 14   Temp:    98.3 F (36.8 C)  TempSrc:    Oral  SpO2: 99% 97% 98% 99%  Weight:      Height:       Temp (24hrs), Avg:99.4 F (37.4 C), Min:98.3 F (36.8 C), Max:100.9 F (38.3 C)  CBC Latest Ref Rng & Units 04/23/2019 04/17/2019 04/15/2019  WBC 4.0 - 10.5 K/uL 7.5 12.2(H) 8.7  Hemoglobin 12.0 - 15.0 g/dL 7.2(L) 8.8(L) 9.2(L)  Hematocrit 36.0 - 46.0 % 23.3(L) 28.7(L) 29.7(L)  Platelets 150 - 400 K/uL 253 230 229   BMP Latest Ref Rng & Units 05/03/2019 05/02/2019 05/01/2019  Glucose 70 - 99 mg/dL 148(H) 134(H) 129(H)  BUN 8 - 23 mg/dL 23 23 24(H)  Creatinine 0.44 - 1.00 mg/dL 1.46(H) 1.70(H) 1.46(H)  Sodium 135 - 145 mmol/L 137 141 143  Potassium 3.5 - 5.1 mmol/L 3.8 4.4 4.0  Chloride 98 - 111 mmol/L 109 112(H) 114(H)  CO2 22 - 32 mmol/L 17(L) 17(L) 17(L)  Calcium 8.9 - 10.3 mg/dL 9.0 9.3 9.3    Intake/Output Summary (Last 24 hours) at 05/03/2019 0631 Last data filed at 05/03/2019 0600 Gross per 24 hour  Intake -  Output 2292 ml  Net -2292 ml    Current Facility-Administered Medications:  .  0.9 %  sodium chloride infusion, , Intravenous, PRN, Meyran, Ocie Cornfield, NP, Stopped at 05/01/19 1710 .  acetaminophen (TYLENOL) tablet 650 mg, 650 mg, Oral, Q4H PRN, 650 mg at 04/26/19 1330 **OR** acetaminophen (TYLENOL) suppository 650 mg, 650 mg, Rectal, Q4H PRN, Judith Part, MD .  amLODipine (NORVASC) tablet 10 mg, 10 mg, Oral, Daily, Judith Part, MD, 10 mg at 05/02/19 0902 .  atorvastatin (LIPITOR) tablet 40 mg, 40 mg, Oral, Daily, Judith Part, MD, 40 mg at 05/02/19 1721 .  Chlorhexidine Gluconate Cloth 2 % PADS 6 each, 6 each, Topical, Daily, Judith Part,  MD, 6 each at 05/02/19 1203 .  docusate sodium (COLACE) capsule 100 mg, 100 mg, Oral, BID, Judith Part, MD, 100 mg at 05/02/19 2103 .  Fluorescein Sodium 10 % injection 500 mg, 500 mg, Intravenous, Once, Asaiah Scarber A, MD .  gabapentin (NEURONTIN) capsule 300 mg, 300 mg, Oral, TID, Eustace Moore, MD, 300 mg at 05/02/19 2103 .  Gerhardt's butt cream, , Topical, BID, Miral Hoopes A, MD .  heparin injection 5,000 Units, 5,000 Units, Subcutaneous, Q8H, Judith Part, MD, 5,000 Units at 05/03/19 [phone]  [DISCONTINUED] lisinopril (ZESTRIL) tablet 40 mg, 40 mg, Oral, Daily, 40 mg at 04/23/19 1018 **AND** hydrochlorothiazide (HYDRODIURIL) tablet 25 mg, 25 mg, Oral, Daily, Judith Part, MD, 25 mg at 05/02/19 0902 .  HYDROcodone-acetaminophen (NORCO/VICODIN) 5-325 MG per tablet 1 tablet, 1 tablet, Oral, Q4H PRN, Judith Part, MD, 1 tablet at 04/26/19 0734 .  HYDROmorphone (DILAUDID) injection 0.5 mg, 0.5 mg, Intravenous, Q3H PRN, Judith Part, MD, 0.5 mg at 04/26/19 0939 .  insulin aspart (novoLOG) injection 0-20 Units, 0-20 Units, Subcutaneous, TID WC, Judith Part, MD, 7 Units at 05/02/19 1206 .  insulin aspart (novoLOG) injection 0-5 Units, 0-5 Units, Subcutaneous, QHS, Judith Part, MD, 3 Units at 04/29/19 2229 .  iron polysaccharides (NIFEREX) capsule 150 mg, 150 mg, Oral, BID, Judith Part, MD, 150 mg at 05/02/19 2123 .  labetalol (NORMODYNE) injection 10-40 mg, 10-40 mg, Intravenous, Q10 min PRN, Judith Part, MD, 10 mg at 04/15/19 1235 .  lidocaine (PF) (XYLOCAINE) 1 % injection, , , PRN, Jacqulynn Cadet, MD, 10 mL at 04/19/19 1436 .  metFORMIN (GLUCOPHAGE) tablet 500 mg, 500 mg, Oral, BID WC, Aryan Bello, Joyice Faster, MD, 500 mg at 05/02/19 1721 .  ondansetron (ZOFRAN) tablet 4 mg, 4 mg, Oral, Q4H PRN, 4 mg at 04/30/19 1043 **OR** ondansetron (ZOFRAN) injection 4 mg, 4 mg, Intravenous, Q4H PRN, Terresa Marlett A, MD .   ondansetron (ZOFRAN) tablet 4 mg, 4 mg, Oral, Q6H PRN **OR** ondansetron (ZOFRAN) injection 4 mg, 4 mg, Intravenous, Q6H PRN, Briahna Pescador A, MD .  polyethylene glycol (MIRALAX / GLYCOLAX) packet 17 g, 17 g, Oral, Daily PRN, Judith Part, MD .  promethazine (PHENERGAN) tablet 12.5-25 mg, 12.5-25 mg, Oral, Q4H PRN, Judith Part, MD .  senna (SENOKOT) tablet 8.6 mg, 1 tablet, Oral, BID, Judith Part, MD, 8.6 mg at 05/02/19 2103 .  sodium chloride (OCEAN) 0.65 % nasal spray 4 spray, 4 spray, Each Nare, PRN, Jerrell Belfast, MD .  tiZANidine (ZANAFLEX) tablet 4 mg, 4 mg, Oral, Q8H PRN, Judith Part, MD, 4 mg at 04/20/19 1554   Physical Exam: Awake, Ox2, PERRL, EOMI, FS, Strength 5/5 x4, SILTx4, VFF with mild bitemporal hemianopsia No rhinorrhea on exam this morning Abdominal incisions c/d/i  Assessment & Plan: 63 y.o. 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 Failed bedside lumbar drain placement. 8/11 LD placed under fluoro, minimal output, 8/14 LD removed; 8/16 rhinorrhea, LD replaced, 8/19 somnolence, CTH w/ large volume pneumocephalus, LD clamped, 8/21 s/p repeat endonasal skull base reconstruction  -continue LD @ 15cc/hr, currently day 3 of 5, will clamp drain on 8/27 -screening CTH w/o contrast today to eval for pneumocephalus w/ increased rate of drainage -cont qd RFPs -continue holding lisinopril and glyburide -SCDs/TEDs/SQH  Judith Part  05/03/19 6:31 AM

## 2019-05-04 LAB — CBC WITH DIFFERENTIAL/PLATELET
Abs Immature Granulocytes: 0.05 10*3/uL (ref 0.00–0.07)
Basophils Absolute: 0 10*3/uL (ref 0.0–0.1)
Basophils Relative: 0 %
Eosinophils Absolute: 0.1 10*3/uL (ref 0.0–0.5)
Eosinophils Relative: 1 %
HCT: 29 % — ABNORMAL LOW (ref 36.0–46.0)
Hemoglobin: 8.7 g/dL — ABNORMAL LOW (ref 12.0–15.0)
Immature Granulocytes: 1 %
Lymphocytes Relative: 19 %
Lymphs Abs: 1.5 10*3/uL (ref 0.7–4.0)
MCH: 26.7 pg (ref 26.0–34.0)
MCHC: 30 g/dL (ref 30.0–36.0)
MCV: 89 fL (ref 80.0–100.0)
Monocytes Absolute: 0.5 10*3/uL (ref 0.1–1.0)
Monocytes Relative: 6 %
Neutro Abs: 5.4 10*3/uL (ref 1.7–7.7)
Neutrophils Relative %: 73 %
Platelets: 263 10*3/uL (ref 150–400)
RBC: 3.26 MIL/uL — ABNORMAL LOW (ref 3.87–5.11)
RDW: 15.3 % (ref 11.5–15.5)
WBC: 7.5 10*3/uL (ref 4.0–10.5)
nRBC: 0 % (ref 0.0–0.2)

## 2019-05-04 LAB — PROTEIN AND GLUCOSE, CSF
Glucose, CSF: 100 mg/dL — ABNORMAL HIGH (ref 40–70)
Total  Protein, CSF: 35 mg/dL (ref 15–45)

## 2019-05-04 LAB — RENAL FUNCTION PANEL
Albumin: 3.6 g/dL (ref 3.5–5.0)
Anion gap: 15 (ref 5–15)
BUN: 22 mg/dL (ref 8–23)
CO2: 16 mmol/L — ABNORMAL LOW (ref 22–32)
Calcium: 9.5 mg/dL (ref 8.9–10.3)
Chloride: 106 mmol/L (ref 98–111)
Creatinine, Ser: 1.41 mg/dL — ABNORMAL HIGH (ref 0.44–1.00)
GFR calc Af Amer: 46 mL/min — ABNORMAL LOW (ref >60)
GFR calc non Af Amer: 40 mL/min — ABNORMAL LOW (ref >60)
Glucose, Bld: 173 mg/dL — ABNORMAL HIGH (ref 70–99)
Phosphorus: 3.6 mg/dL (ref 2.5–4.6)
Potassium: 4.6 mmol/L (ref 3.5–5.1)
Sodium: 137 mmol/L (ref 135–145)

## 2019-05-04 LAB — CSF CELL COUNT WITH DIFFERENTIAL
Eosinophils, CSF: 0 % (ref 0–1)
Lymphs, CSF: 81 % — ABNORMAL HIGH (ref 40–80)
Monocyte-Macrophage-Spinal Fluid: 12 % — ABNORMAL LOW (ref 15–45)
RBC Count, CSF: 103 /mm3 — ABNORMAL HIGH
Segmented Neutrophils-CSF: 7 % — ABNORMAL HIGH (ref 0–6)
WBC, CSF: 24 /mm3 (ref 0–5)

## 2019-05-04 LAB — GLUCOSE, CAPILLARY
Glucose-Capillary: 163 mg/dL — ABNORMAL HIGH (ref 70–99)
Glucose-Capillary: 171 mg/dL — ABNORMAL HIGH (ref 70–99)
Glucose-Capillary: 140 mg/dL — ABNORMAL HIGH (ref 70–99)
Glucose-Capillary: 134 mg/dL — ABNORMAL HIGH (ref 70–99)

## 2019-05-04 NOTE — Progress Notes (Signed)
Pt vomited x 1. Gave zofran. Paged Dr. Zada Finders, awaiting call back.

## 2019-05-04 NOTE — Progress Notes (Signed)
Called Dr. Zada Finders - pt febrile (temp 101.7) and tachycardic (HR 120-125bpm). Awaiting call back.

## 2019-05-04 NOTE — Progress Notes (Signed)
Neurosurgery Service Progress Note  Subjective: No acute events overnight, no drainage overnight, spiked a fever this morning to 39.3, but currently says she's feeling well. Emesis earlier but resolved now  Objective: Vitals:   05/04/19 1000 05/04/19 1045 05/04/19 1100 05/04/19 1200  BP: 117/65  97/69 118/73  Pulse: (!) 109 (!) 101 (!) 110 99  Resp: 17 14 (!) 24 18  Temp: (!) 102.8 F (39.3 C) 99.3 F (37.4 C)  99.6 F (37.6 C)  TempSrc: Oral Oral  Oral  SpO2: 96% 100% 96% 98%  Weight:      Height:       Temp (24hrs), Avg:100.2 F (37.9 C), Min:97.9 F (36.6 C), Max:103.2 F (39.6 C)  CBC Latest Ref Rng & Units 04/23/2019 04/17/2019 04/15/2019  WBC 4.0 - 10.5 K/uL 7.5 12.2(H) 8.7  Hemoglobin 12.0 - 15.0 g/dL 7.2(L) 8.8(L) 9.2(L)  Hematocrit 36.0 - 46.0 % 23.3(L) 28.7(L) 29.7(L)  Platelets 150 - 400 K/uL 253 230 229   BMP Latest Ref Rng & Units 05/04/2019 05/03/2019 05/02/2019  Glucose 70 - 99 mg/dL 173(H) 148(H) 134(H)  BUN 8 - 23 mg/dL 22 23 23   Creatinine 0.44 - 1.00 mg/dL 1.41(H) 1.46(H) 1.70(H)  Sodium 135 - 145 mmol/L 137 137 141  Potassium 3.5 - 5.1 mmol/L 4.6 3.8 4.4  Chloride 98 - 111 mmol/L 106 109 112(H)  CO2 22 - 32 mmol/L 16(L) 17(L) 17(L)  Calcium 8.9 - 10.3 mg/dL 9.5 9.0 9.3    Intake/Output Summary (Last 24 hours) at 05/04/2019 1241 Last data filed at 05/04/2019 1200 Gross per 24 hour  Intake 480 ml  Output 1469 ml  Net -989 ml    Current Facility-Administered Medications:  .  0.9 %  sodium chloride infusion, , Intravenous, PRN, Meyran, Ocie Cornfield, NP, Stopped at 05/01/19 1710 .  acetaminophen (TYLENOL) tablet 650 mg, 650 mg, Oral, Q4H PRN, 650 mg at 05/04/19 0750 **OR** acetaminophen (TYLENOL) suppository 650 mg, 650 mg, Rectal, Q4H PRN, Judith Part, MD .  amLODipine (NORVASC) tablet 10 mg, 10 mg, Oral, Daily, Judith Part, MD, 10 mg at 05/03/19 0917 .  atorvastatin (LIPITOR) tablet 40 mg, 40 mg, Oral, Daily, Judith Part, MD, 40  mg at 05/03/19 1701 .  Chlorhexidine Gluconate Cloth 2 % PADS 6 each, 6 each, Topical, Daily, Judith Part, MD, 6 each at 05/03/19 0930 .  docusate sodium (COLACE) capsule 100 mg, 100 mg, Oral, BID, Judith Part, MD, 100 mg at 05/03/19 0918 .  feeding supplement (ENSURE ENLIVE) (ENSURE ENLIVE) liquid 237 mL, 237 mL, Oral, BID BM, Ewin Rehberg A, MD, 237 mL at 05/03/19 1332 .  Fluorescein Sodium 10 % injection 500 mg, 500 mg, Intravenous, Once, Laster Appling A, MD .  gabapentin (NEURONTIN) capsule 300 mg, 300 mg, Oral, TID, Eustace Moore, MD, 300 mg at 05/03/19 2300 .  Gerhardt's butt cream, , Topical, BID, Ameya Vowell A, MD .  heparin injection 5,000 Units, 5,000 Units, Subcutaneous, Q8H, Judith Part, MD, 5,000 Units at 05/04/19 0556 .  [DISCONTINUED] lisinopril (ZESTRIL) tablet 40 mg, 40 mg, Oral, Daily, 40 mg at 04/23/19 1018 **AND** hydrochlorothiazide (HYDRODIURIL) tablet 25 mg, 25 mg, Oral, Daily, Judith Part, MD, 25 mg at 05/03/19 0917 .  HYDROcodone-acetaminophen (NORCO/VICODIN) 5-325 MG per tablet 1 tablet, 1 tablet, Oral, Q4H PRN, Judith Part, MD, 1 tablet at 04/26/19 0734 .  HYDROmorphone (DILAUDID) injection 0.5 mg, 0.5 mg, Intravenous, Q3H PRN, Judith Part, MD, 0.5 mg at 04/26/19  [id number] .  insulin aspart (novoLOG) injection 0-20 Units, 0-20 Units, Subcutaneous, TID WC, Krisha Beegle, Joyice Faster, MD, 4 Units at 05/04/19 1149 .  insulin aspart (novoLOG) injection 0-5 Units, 0-5 Units, Subcutaneous, QHS, Judith Part, MD, 3 Units at 04/29/19 2229 .  iron polysaccharides (NIFEREX) capsule 150 mg, 150 mg, Oral, BID, Keiran Sias, Joyice Faster, MD, 150 mg at 05/03/19 2300 .  labetalol (NORMODYNE) injection 10-40 mg, 10-40 mg, Intravenous, Q10 min PRN, Judith Part, MD, 10 mg at 04/15/19 1235 .  lidocaine (PF) (XYLOCAINE) 1 % injection, , , PRN, Jacqulynn Cadet, MD, 10 mL at 04/19/19 1436 .  metFORMIN (GLUCOPHAGE) tablet 500 mg, 500  mg, Oral, BID WC, Etana Beets, Joyice Faster, MD, 500 mg at 05/04/19 0746 .  ondansetron (ZOFRAN) tablet 4 mg, 4 mg, Oral, Q4H PRN, 4 mg at 04/30/19 1043 **OR** ondansetron (ZOFRAN) injection 4 mg, 4 mg, Intravenous, Q4H PRN, Judith Part, MD, 4 mg at 05/04/19 0845 .  ondansetron (ZOFRAN) tablet 4 mg, 4 mg, Oral, Q6H PRN **OR** ondansetron (ZOFRAN) injection 4 mg, 4 mg, Intravenous, Q6H PRN, Velia Pamer A, MD .  polyethylene glycol (MIRALAX / GLYCOLAX) packet 17 g, 17 g, Oral, Daily PRN, Judith Part, MD .  promethazine (PHENERGAN) tablet 12.5-25 mg, 12.5-25 mg, Oral, Q4H PRN, Judith Part, MD .  senna (SENOKOT) tablet 8.6 mg, 1 tablet, Oral, BID, Judith Part, MD, 8.6 mg at 05/03/19 0918 .  sodium chloride (OCEAN) 0.65 % nasal spray 4 spray, 4 spray, Each Nare, PRN, Jerrell Belfast, MD .  tiZANidine (ZANAFLEX) tablet 4 mg, 4 mg, Oral, Q8H PRN, Judith Part, MD, 4 mg at 04/20/19 1554   Physical Exam: Awake, Ox2, PERRL, EOMI, FS, Strength 5/5 x4, SILTx4, VFF with mild bitemporal hemianopsia No rhinorrhea on exam this morning Abdominal incisions c/d/i  Assessment & Plan: 63 y.o. 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 Failed bedside lumbar drain placement. 8/11 LD placed under fluoro, minimal output, 8/14 LD removed; 8/16 rhinorrhea, LD replaced, 8/19 somnolence, CTH w/ large volume pneumocephalus, LD clamped, 8/21 s/p repeat endonasal skull base reconstruction, 8/25 rpt CTH with resolving pneumocephalus  -continue LD @ 15cc/hr, currently day 4 of 5, will clamp drain on 8/27 -sent CSF from LD and BCx, add on CBC for fever, will start empiric ABx if she spikes again -cont qd RFPs -continue holding lisinopril and glyburide -SCDs/TEDs/SQH  Judith Part  05/04/19 12:41 PM

## 2019-05-04 NOTE — Progress Notes (Addendum)
CRITICAL VALUE ALERT  Critical Value:  Csf wbc 24  Date & Time Notied:  05/04/2019 1415  Provider Notified: Dr. Zada Finders  Orders Received/Actions taken: PA notified.  No new orders at this time.

## 2019-05-05 LAB — GLUCOSE, CAPILLARY
Glucose-Capillary: 161 mg/dL — ABNORMAL HIGH (ref 70–99)
Glucose-Capillary: 209 mg/dL — ABNORMAL HIGH (ref 70–99)
Glucose-Capillary: 168 mg/dL — ABNORMAL HIGH (ref 70–99)
Glucose-Capillary: 140 mg/dL — ABNORMAL HIGH (ref 70–99)

## 2019-05-05 LAB — AEROBIC/ANAEROBIC CULTURE W GRAM STAIN (SURGICAL/DEEP WOUND)

## 2019-05-05 LAB — RENAL FUNCTION PANEL
Albumin: 3.2 g/dL — ABNORMAL LOW (ref 3.5–5.0)
Anion gap: 12 (ref 5–15)
BUN: 23 mg/dL (ref 8–23)
CO2: 18 mmol/L — ABNORMAL LOW (ref 22–32)
Calcium: 9.2 mg/dL (ref 8.9–10.3)
Chloride: 105 mmol/L (ref 98–111)
Creatinine, Ser: 1.75 mg/dL — ABNORMAL HIGH (ref 0.44–1.00)
GFR calc Af Amer: 36 mL/min — ABNORMAL LOW (ref >60)
GFR calc non Af Amer: 31 mL/min — ABNORMAL LOW (ref >60)
Glucose, Bld: 166 mg/dL — ABNORMAL HIGH (ref 70–99)
Phosphorus: 3.8 mg/dL (ref 2.5–4.6)
Potassium: 4.3 mmol/L (ref 3.5–5.1)
Sodium: 135 mmol/L (ref 135–145)

## 2019-05-05 LAB — PATHOLOGIST SMEAR REVIEW

## 2019-05-05 MED ORDER — PANTOPRAZOLE SODIUM 40 MG PO PACK
40.0000 mg | PACK | Freq: Every day | ORAL | Status: DC
  Administered 2019-05-06: 40 mg via ORAL
  Filled 2019-05-05: qty 20

## 2019-05-05 MED ORDER — METOPROLOL TARTRATE 5 MG/5ML IV SOLN
5.0000 mg | Freq: Once | INTRAVENOUS | Status: AC
  Administered 2019-05-05: 5 mg via INTRAVENOUS
  Filled 2019-05-05: qty 5

## 2019-05-05 MED ORDER — ALUM & MAG HYDROXIDE-SIMETH 200-200-20 MG/5ML PO SUSP
30.0000 mL | Freq: Four times a day (QID) | ORAL | Status: DC | PRN
  Administered 2019-05-05: 30 mL via ORAL
  Filled 2019-05-05: qty 30

## 2019-05-05 NOTE — Progress Notes (Signed)
Neurosurgery Service Progress Note  Subjective: No acute events overnight, no further fevers, no rhinorrhea  Objective: Vitals:   05/05/19 0500 05/05/19 0600 05/05/19 0700 05/05/19 0800  BP: 110/68 114/77 132/72 (!) 141/75  Pulse: 94 98 100   Resp: 16 17 18 20   Temp:    (!) 100.8 F (38.2 C)  TempSrc:    Oral  SpO2: 94% 93% 96% 100%  Weight:      Height:       Temp (24hrs), Avg:100 F (37.8 C), Min:99.2 F (37.3 C), Max:102.8 F (39.3 C)  CBC Latest Ref Rng & Units 05/04/2019 04/23/2019 04/17/2019  WBC 4.0 - 10.5 K/uL 7.5 7.5 12.2(H)  Hemoglobin 12.0 - 15.0 g/dL 8.7(L) 7.2(L) 8.8(L)  Hematocrit 36.0 - 46.0 % 29.0(L) 23.3(L) 28.7(L)  Platelets 150 - 400 K/uL 263 253 230   BMP Latest Ref Rng & Units 05/05/2019 05/04/2019 05/03/2019  Glucose 70 - 99 mg/dL 166(H) 173(H) 148(H)  BUN 8 - 23 mg/dL 23 22 23   Creatinine 0.44 - 1.00 mg/dL 1.75(H) 1.41(H) 1.46(H)  Sodium 135 - 145 mmol/L 135 137 137  Potassium 3.5 - 5.1 mmol/L 4.3 4.6 3.8  Chloride 98 - 111 mmol/L 105 106 109  CO2 22 - 32 mmol/L 18(L) 16(L) 17(L)  Calcium 8.9 - 10.3 mg/dL 9.2 9.5 9.0    Intake/Output Summary (Last 24 hours) at 05/05/2019 0913 Last data filed at 05/05/2019 0800 Gross per 24 hour  Intake -  Output 1401 ml  Net -1401 ml    Current Facility-Administered Medications:  .  0.9 %  sodium chloride infusion, , Intravenous, PRN, Meyran, Ocie Cornfield, NP, Stopped at 05/01/19 1710 .  acetaminophen (TYLENOL) tablet 650 mg, 650 mg, Oral, Q4H PRN, 650 mg at 05/04/19 0750 **OR** acetaminophen (TYLENOL) suppository 650 mg, 650 mg, Rectal, Q4H PRN, Judith Part, MD .  amLODipine (NORVASC) tablet 10 mg, 10 mg, Oral, Daily, Judith Part, MD, 10 mg at 05/03/19 0917 .  atorvastatin (LIPITOR) tablet 40 mg, 40 mg, Oral, Daily, Judith Part, MD, 40 mg at 05/04/19 1705 .  Chlorhexidine Gluconate Cloth 2 % PADS 6 each, 6 each, Topical, Daily, Judith Part, MD, 6 each at 05/04/19 1506 .  docusate  sodium (COLACE) capsule 100 mg, 100 mg, Oral, BID, Judith Part, MD, 100 mg at 05/04/19 2144 .  feeding supplement (ENSURE ENLIVE) (ENSURE ENLIVE) liquid 237 mL, 237 mL, Oral, BID BM, Nickolus Wadding A, MD, 237 mL at 05/03/19 1332 .  Fluorescein Sodium 10 % injection 500 mg, 500 mg, Intravenous, Once, Telford Archambeau A, MD .  gabapentin (NEURONTIN) capsule 300 mg, 300 mg, Oral, TID, Eustace Moore, MD, 300 mg at 05/04/19 2145 .  Gerhardt's butt cream, , Topical, BID, Maiko Salais A, MD .  heparin injection 5,000 Units, 5,000 Units, Subcutaneous, Q8H, Dajana Gehrig, Joyice Faster, MD, 5,000 Units at 05/05/19 0630 .  [DISCONTINUED] lisinopril (ZESTRIL) tablet 40 mg, 40 mg, Oral, Daily, 40 mg at 04/23/19 1018 **AND** hydrochlorothiazide (HYDRODIURIL) tablet 25 mg, 25 mg, Oral, Daily, Judith Part, MD, 25 mg at 05/03/19 0917 .  HYDROcodone-acetaminophen (NORCO/VICODIN) 5-325 MG per tablet 1 tablet, 1 tablet, Oral, Q4H PRN, Judith Part, MD, 1 tablet at 04/26/19 0734 .  HYDROmorphone (DILAUDID) injection 0.5 mg, 0.5 mg, Intravenous, Q3H PRN, Judith Part, MD, 0.5 mg at 04/26/19 0939 .  insulin aspart (novoLOG) injection 0-20 Units, 0-20 Units, Subcutaneous, TID WC, Judith Part, MD, 4 Units at 05/05/19 0820 .  insulin  aspart (novoLOG) injection 0-5 Units, 0-5 Units, Subcutaneous, QHS, Judith Part, MD, 3 Units at 04/29/19 2229 .  iron polysaccharides (NIFEREX) capsule 150 mg, 150 mg, Oral, BID, Judith Part, MD, 150 mg at 05/04/19 2144 .  labetalol (NORMODYNE) injection 10-40 mg, 10-40 mg, Intravenous, Q10 min PRN, Judith Part, MD, 10 mg at 04/15/19 1235 .  lidocaine (PF) (XYLOCAINE) 1 % injection, , , PRN, Jacqulynn Cadet, MD, 10 mL at 04/19/19 1436 .  metFORMIN (GLUCOPHAGE) tablet 500 mg, 500 mg, Oral, BID WC, Jazzman Loughmiller, Joyice Faster, MD, 500 mg at 05/05/19 0820 .  ondansetron (ZOFRAN) tablet 4 mg, 4 mg, Oral, Q4H PRN, 4 mg at 04/30/19 1043 **OR**  ondansetron (ZOFRAN) injection 4 mg, 4 mg, Intravenous, Q4H PRN, Judith Part, MD, 4 mg at 05/04/19 0845 .  ondansetron (ZOFRAN) tablet 4 mg, 4 mg, Oral, Q6H PRN **OR** ondansetron (ZOFRAN) injection 4 mg, 4 mg, Intravenous, Q6H PRN, Marsa Matteo A, MD .  polyethylene glycol (MIRALAX / GLYCOLAX) packet 17 g, 17 g, Oral, Daily PRN, Judith Part, MD .  promethazine (PHENERGAN) tablet 12.5-25 mg, 12.5-25 mg, Oral, Q4H PRN, Judith Part, MD .  senna (SENOKOT) tablet 8.6 mg, 1 tablet, Oral, BID, Judith Part, MD, 8.6 mg at 05/04/19 2145 .  sodium chloride (OCEAN) 0.65 % nasal spray 4 spray, 4 spray, Each Nare, PRN, Jerrell Belfast, MD .  tiZANidine (ZANAFLEX) tablet 4 mg, 4 mg, Oral, Q8H PRN, Judith Part, MD, 4 mg at 04/20/19 1554   Physical Exam: Awake, Ox2, PERRL, EOMI, FS, Strength 5/5 x4, SILTx4, VFF with mild bitemporal hemianopsia No rhinorrhea on exam this morning Abdominal incisions c/d/i  Assessment & Plan: 63 y.o. 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 Failed bedside lumbar drain placement. 8/11 LD placed under fluoro, minimal output, 8/14 LD removed; 8/16 rhinorrhea, LD replaced, 8/19 somnolence, CTH w/ large volume pneumocephalus, LD clamped, 8/21 s/p repeat endonasal skull base reconstruction, 8/24 episode of rhinorrhea, LD increased to 15cc/hr, 8/25 rpt CTH with resolving pneumocephalus  -continue LD @ 15cc/hr, currently day 5 of 5, last leaked 3d ago, will clamp drain on 8/27 and monitor for 24h -fever yesterday, CSF c/w post-op findings, will start empiric ABx if she spikes again -cont qd RFPs -continue holding lisinopril and glyburide -SCDs/TEDs/SQH  Judith Part  05/05/19 9:13 AM

## 2019-05-05 NOTE — Progress Notes (Signed)
Patient with an episode of vomiting this am. She is neuro intact and not complaining of any pain. No issues with her drain. Order to keep it open until 8/28. Her HR is in the 120-130's sustaining. MD notified of all of the above and new orders given. Will watch her closely. Pt's sister has been updated and will call back this afternoon. Makana Rostad, Rande Brunt, RN

## 2019-05-06 LAB — RENAL FUNCTION PANEL
Albumin: 3.1 g/dL — ABNORMAL LOW (ref 3.5–5.0)
Anion gap: 13 (ref 5–15)
BUN: 31 mg/dL — ABNORMAL HIGH (ref 8–23)
CO2: 18 mmol/L — ABNORMAL LOW (ref 22–32)
Calcium: 9 mg/dL (ref 8.9–10.3)
Chloride: 100 mmol/L (ref 98–111)
Creatinine, Ser: 2.56 mg/dL — ABNORMAL HIGH (ref 0.44–1.00)
GFR calc Af Amer: 23 mL/min — ABNORMAL LOW (ref >60)
GFR calc non Af Amer: 19 mL/min — ABNORMAL LOW (ref >60)
Glucose, Bld: 190 mg/dL — ABNORMAL HIGH (ref 70–99)
Phosphorus: 3.7 mg/dL (ref 2.5–4.6)
Potassium: 4.4 mmol/L (ref 3.5–5.1)
Sodium: 131 mmol/L — ABNORMAL LOW (ref 135–145)

## 2019-05-06 LAB — CBC
HCT: 23.1 % — ABNORMAL LOW (ref 36.0–46.0)
Hemoglobin: 7.3 g/dL — ABNORMAL LOW (ref 12.0–15.0)
MCH: 27 pg (ref 26.0–34.0)
MCHC: 31.6 g/dL (ref 30.0–36.0)
MCV: 85.6 fL (ref 80.0–100.0)
Platelets: 197 10*3/uL (ref 150–400)
RBC: 2.7 MIL/uL — ABNORMAL LOW (ref 3.87–5.11)
RDW: 15 % (ref 11.5–15.5)
WBC: 9.6 10*3/uL (ref 4.0–10.5)
nRBC: 0 % (ref 0.0–0.2)

## 2019-05-06 LAB — GLUCOSE, CAPILLARY
Glucose-Capillary: 139 mg/dL — ABNORMAL HIGH (ref 70–99)
Glucose-Capillary: 136 mg/dL — ABNORMAL HIGH (ref 70–99)
Glucose-Capillary: 178 mg/dL — ABNORMAL HIGH (ref 70–99)
Glucose-Capillary: 144 mg/dL — ABNORMAL HIGH (ref 70–99)

## 2019-05-06 MED ORDER — GABAPENTIN 300 MG PO CAPS
300.0000 mg | ORAL_CAPSULE | Freq: Two times a day (BID) | ORAL | Status: DC
  Administered 2019-05-06 – 2019-06-11 (×69): 300 mg via ORAL
  Filled 2019-05-06 (×70): qty 1

## 2019-05-06 MED ORDER — SODIUM CHLORIDE 0.9 % IV SOLN
INTRAVENOUS | Status: DC
  Administered 2019-05-06 – 2019-06-01 (×31): via INTRAVENOUS

## 2019-05-06 NOTE — Progress Notes (Signed)
Pt c/o chills @2100 , temp check showed to be 102.4 orally. Pt given PRN Tylenol, blankets removed. Temp recheck showed to be 102.9 orally. Spoke with on-call NP M. Bergman. New orders received. Will add ice packs to pt as well. Will continue to monitor. Pt without any other complaints. Drain site unchanged, drain remains clamped at this time.

## 2019-05-06 NOTE — Progress Notes (Signed)
Neurosurgery Service Progress Note  Subjective: No acute events overnight, no further fevers, no rhinorrhea, pressures a little soft, Cr bumped  Objective: Vitals:   05/06/19 0400 05/06/19 0500 05/06/19 0600 05/06/19 0700  BP: 91/60 (!) 82/59 91/61 (!) 92/56  Pulse: 95 84 80 78  Resp: 16 16 15 14   Temp: 98.2 F (36.8 C)     TempSrc: Oral     SpO2: 95% 95% 94% 95%  Weight:      Height:       Temp (24hrs), Avg:99.7 F (37.6 C), Min:98 F (36.7 C), Max:102.7 F (39.3 C)  CBC Latest Ref Rng & Units 05/04/2019 04/23/2019 04/17/2019  WBC 4.0 - 10.5 K/uL 7.5 7.5 12.2(H)  Hemoglobin 12.0 - 15.0 g/dL 8.7(L) 7.2(L) 8.8(L)  Hematocrit 36.0 - 46.0 % 29.0(L) 23.3(L) 28.7(L)  Platelets 150 - 400 K/uL 263 253 230   BMP Latest Ref Rng & Units 05/06/2019 05/05/2019 05/04/2019  Glucose 70 - 99 mg/dL 190(H) 166(H) 173(H)  BUN 8 - 23 mg/dL 31(H) 23 22  Creatinine 0.44 - 1.00 mg/dL 2.56(H) 1.75(H) 1.41(H)  Sodium 135 - 145 mmol/L 131(L) 135 137  Potassium 3.5 - 5.1 mmol/L 4.4 4.3 4.6  Chloride 98 - 111 mmol/L 100 105 106  CO2 22 - 32 mmol/L 18(L) 18(L) 16(L)  Calcium 8.9 - 10.3 mg/dL 9.0 9.2 9.5    Intake/Output Summary (Last 24 hours) at 05/06/2019 0747 Last data filed at 05/06/2019 0734 Gross per 24 hour  Intake 600 ml  Output 1371 ml  Net -771 ml    Current Facility-Administered Medications:  .  0.9 %  sodium chloride infusion, , Intravenous, PRN, Meyran, Ocie Cornfield, NP, Stopped at 05/01/19 1710 .  0.9 %  sodium chloride infusion, , Intravenous, Continuous, Atoya Andrew, Joyice Faster, MD .  acetaminophen (TYLENOL) tablet 650 mg, 650 mg, Oral, Q4H PRN, 650 mg at 05/06/19 0226 **OR** acetaminophen (TYLENOL) suppository 650 mg, 650 mg, Rectal, Q4H PRN, Judith Part, MD .  alum & mag hydroxide-simeth (MAALOX/MYLANTA) 200-200-20 MG/5ML suspension 30 mL, 30 mL, Oral, Q6H PRN, Judith Part, MD, 30 mL at 05/05/19 1523 .  amLODipine (NORVASC) tablet 10 mg, 10 mg, Oral, Daily, Olon Russ,  Harrison Zetina A, MD, 10 mg at 05/05/19 1009 .  atorvastatin (LIPITOR) tablet 40 mg, 40 mg, Oral, Daily, Teddi Badalamenti, Joyice Faster, MD, 40 mg at 05/05/19 1730 .  Chlorhexidine Gluconate Cloth 2 % PADS 6 each, 6 each, Topical, Daily, Judith Part, MD, 6 each at 05/05/19 1433 .  docusate sodium (COLACE) capsule 100 mg, 100 mg, Oral, BID, Judith Part, MD, 100 mg at 05/05/19 2300 .  feeding supplement (ENSURE ENLIVE) (ENSURE ENLIVE) liquid 237 mL, 237 mL, Oral, BID BM, Axten Pascucci A, MD, 237 mL at 05/03/19 1332 .  Fluorescein Sodium 10 % injection 500 mg, 500 mg, Intravenous, Once, Joshuah Minella A, MD .  gabapentin (NEURONTIN) capsule 300 mg, 300 mg, Oral, TID, Eustace Moore, MD, 300 mg at 05/05/19 2300 .  Gerhardt's butt cream, , Topical, BID, Vashon Riordan A, MD .  heparin injection 5,000 Units, 5,000 Units, Subcutaneous, Q8H, Judith Part, MD, 5,000 Units at 05/06/19 [phone] .  [DISCONTINUED] lisinopril (ZESTRIL) tablet 40 mg, 40 mg, Oral, Daily, 40 mg at 04/23/19 1018 **AND** hydrochlorothiazide (HYDRODIURIL) tablet 25 mg, 25 mg, Oral, Daily, Mizraim Harmening A, MD, 25 mg at 05/05/19 1009 .  HYDROcodone-acetaminophen (NORCO/VICODIN) 5-325 MG per tablet 1 tablet, 1 tablet, Oral, Q4H PRN, Judith Part, MD, 1 tablet at  04/26/19 0734 .  HYDROmorphone (DILAUDID) injection 0.5 mg, 0.5 mg, Intravenous, Q3H PRN, Judith Part, MD, 0.5 mg at 04/26/19 0939 .  insulin aspart (novoLOG) injection 0-20 Units, 0-20 Units, Subcutaneous, TID WC, Judith Part, MD, 4 Units at 05/05/19 1730 .  insulin aspart (novoLOG) injection 0-5 Units, 0-5 Units, Subcutaneous, QHS, Judith Part, MD, 3 Units at 04/29/19 2229 .  iron polysaccharides (NIFEREX) capsule 150 mg, 150 mg, Oral, BID, Unity Luepke, Joyice Faster, MD, 150 mg at 05/05/19 2300 .  labetalol (NORMODYNE) injection 10-40 mg, 10-40 mg, Intravenous, Q10 min PRN, Judith Part, MD, 10 mg at 04/15/19 1235 .  lidocaine (PF)  (XYLOCAINE) 1 % injection, , , PRN, Jacqulynn Cadet, MD, 10 mL at 04/19/19 1436 .  metFORMIN (GLUCOPHAGE) tablet 500 mg, 500 mg, Oral, BID WC, Cejay Cambre, Joyice Faster, MD, 500 mg at 05/05/19 1730 .  ondansetron (ZOFRAN) tablet 4 mg, 4 mg, Oral, Q4H PRN, 4 mg at 04/30/19 1043 **OR** ondansetron (ZOFRAN) injection 4 mg, 4 mg, Intravenous, Q4H PRN, Judith Part, MD, 4 mg at 05/04/19 0845 .  ondansetron (ZOFRAN) tablet 4 mg, 4 mg, Oral, Q6H PRN **OR** ondansetron (ZOFRAN) injection 4 mg, 4 mg, Intravenous, Q6H PRN, Judith Part, MD, 4 mg at 05/05/19 0941 .  pantoprazole sodium (PROTONIX) 40 mg/20 mL oral suspension 40 mg, 40 mg, Oral, Daily, Koleson Reifsteck A, MD .  polyethylene glycol (MIRALAX / GLYCOLAX) packet 17 g, 17 g, Oral, Daily PRN, Judith Part, MD .  promethazine (PHENERGAN) tablet 12.5-25 mg, 12.5-25 mg, Oral, Q4H PRN, Judith Part, MD .  senna (SENOKOT) tablet 8.6 mg, 1 tablet, Oral, BID, Judith Part, MD, 8.6 mg at 05/05/19 2300 .  sodium chloride (OCEAN) 0.65 % nasal spray 4 spray, 4 spray, Each Nare, PRN, Jerrell Belfast, MD .  tiZANidine (ZANAFLEX) tablet 4 mg, 4 mg, Oral, Q8H PRN, Judith Part, MD, 4 mg at 04/20/19 1554   Physical Exam: Awake, Ox2, PERRL, EOMI, FS, Strength 5/5 x4, SILTx4, VFF with mild bitemporal hemianopsia No rhinorrhea on exam this morning Abdominal incisions c/d/i  Assessment & Plan: 63 y.o. 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 Failed bedside lumbar drain placement. 8/11 LD placed under fluoro, minimal output, 8/14 LD removed; 8/16 rhinorrhea, LD replaced, 8/19 somnolence, CTH w/ large volume pneumocephalus, LD clamped, 8/21 s/p repeat endonasal skull base reconstruction, 8/24 episode of rhinorrhea, LD increased to 15cc/hr, 8/25 rpt CTH  with resolving pneumocephalus  -tachy with Cr bump and soft pressures, will restart IVF, RFP in AM -clamp LD today, if no leak x24h, will have her lean over tomorrow morning to check for provoked leak, if none then d/c LD -CSF & BCx neg -cont qd RFPs -continue holding lisinopril and glyburide -SCDs/TEDs/SQH  Judith Part  05/06/19 7:47 AM

## 2019-05-07 LAB — URINALYSIS, ROUTINE W REFLEX MICROSCOPIC
Bilirubin Urine: NEGATIVE
Glucose, UA: NEGATIVE mg/dL
Ketones, ur: NEGATIVE mg/dL
Nitrite: NEGATIVE
Protein, ur: NEGATIVE mg/dL
Specific Gravity, Urine: 1.008 (ref 1.005–1.030)
WBC, UA: >50 WBC/hpf — ABNORMAL HIGH (ref 0–5)
pH: 5 (ref 5.0–8.0)

## 2019-05-07 LAB — RENAL FUNCTION PANEL
Albumin: 3.2 g/dL — ABNORMAL LOW (ref 3.5–5.0)
Anion gap: 13 (ref 5–15)
BUN: 34 mg/dL — ABNORMAL HIGH (ref 8–23)
CO2: 17 mmol/L — ABNORMAL LOW (ref 22–32)
Calcium: 8.7 mg/dL — ABNORMAL LOW (ref 8.9–10.3)
Chloride: 102 mmol/L (ref 98–111)
Creatinine, Ser: 2.17 mg/dL — ABNORMAL HIGH (ref 0.44–1.00)
GFR calc Af Amer: 28 mL/min — ABNORMAL LOW (ref >60)
GFR calc non Af Amer: 24 mL/min — ABNORMAL LOW (ref >60)
Glucose, Bld: 158 mg/dL — ABNORMAL HIGH (ref 70–99)
Phosphorus: 2.7 mg/dL (ref 2.5–4.6)
Potassium: 4.6 mmol/L (ref 3.5–5.1)
Sodium: 132 mmol/L — ABNORMAL LOW (ref 135–145)

## 2019-05-07 LAB — GLUCOSE, CAPILLARY
Glucose-Capillary: 153 mg/dL — ABNORMAL HIGH (ref 70–99)
Glucose-Capillary: 244 mg/dL — ABNORMAL HIGH (ref 70–99)
Glucose-Capillary: 103 mg/dL — ABNORMAL HIGH (ref 70–99)
Glucose-Capillary: 189 mg/dL — ABNORMAL HIGH (ref 70–99)

## 2019-05-07 LAB — BASIC METABOLIC PANEL
Anion gap: 14 (ref 5–15)
BUN: 34 mg/dL — ABNORMAL HIGH (ref 8–23)
CO2: 17 mmol/L — ABNORMAL LOW (ref 22–32)
Calcium: 8.6 mg/dL — ABNORMAL LOW (ref 8.9–10.3)
Chloride: 102 mmol/L (ref 98–111)
Creatinine, Ser: 2.08 mg/dL — ABNORMAL HIGH (ref 0.44–1.00)
GFR calc Af Amer: 29 mL/min — ABNORMAL LOW (ref >60)
GFR calc non Af Amer: 25 mL/min — ABNORMAL LOW (ref >60)
Glucose, Bld: 162 mg/dL — ABNORMAL HIGH (ref 70–99)
Potassium: 4.6 mmol/L (ref 3.5–5.1)
Sodium: 133 mmol/L — ABNORMAL LOW (ref 135–145)

## 2019-05-07 LAB — CSF CULTURE W GRAM STAIN
Culture: NO GROWTH
Special Requests: NORMAL

## 2019-05-07 MED ORDER — PANTOPRAZOLE SODIUM 40 MG PO TBEC
40.0000 mg | DELAYED_RELEASE_TABLET | Freq: Every day | ORAL | Status: DC
  Administered 2019-05-07 – 2019-06-11 (×34): 40 mg via ORAL
  Filled 2019-05-07 (×34): qty 1

## 2019-05-07 NOTE — Progress Notes (Signed)
Overall patient doing well.  No headaches.  No significant nasal drainage overnight.  Continues to run some fevers.  Work-up so far negative.  Patient sat up and postured at the bedside.  No evidence of CSF leak from her nose.  She is awake and alert.  She is oriented and appropriate.  Her lumbar drain was removed today.  Overall progressing well.  Continue bedrest today.  Begin mobilization tomorrow if stable.

## 2019-05-08 LAB — RENAL FUNCTION PANEL
Albumin: 2.8 g/dL — ABNORMAL LOW (ref 3.5–5.0)
Anion gap: 10 (ref 5–15)
BUN: 20 mg/dL (ref 8–23)
CO2: 20 mmol/L — ABNORMAL LOW (ref 22–32)
Calcium: 9 mg/dL (ref 8.9–10.3)
Chloride: 108 mmol/L (ref 98–111)
Creatinine, Ser: 1.38 mg/dL — ABNORMAL HIGH (ref 0.44–1.00)
GFR calc Af Amer: 48 mL/min — ABNORMAL LOW (ref >60)
GFR calc non Af Amer: 41 mL/min — ABNORMAL LOW (ref >60)
Glucose, Bld: 145 mg/dL — ABNORMAL HIGH (ref 70–99)
Phosphorus: 2.7 mg/dL (ref 2.5–4.6)
Potassium: 4.2 mmol/L (ref 3.5–5.1)
Sodium: 138 mmol/L (ref 135–145)

## 2019-05-08 LAB — GLUCOSE, CAPILLARY
Glucose-Capillary: 142 mg/dL — ABNORMAL HIGH (ref 70–99)
Glucose-Capillary: 235 mg/dL — ABNORMAL HIGH (ref 70–99)
Glucose-Capillary: 166 mg/dL — ABNORMAL HIGH (ref 70–99)
Glucose-Capillary: 156 mg/dL — ABNORMAL HIGH (ref 70–99)

## 2019-05-08 NOTE — Progress Notes (Signed)
Overall stable.  No headache.  Patient reports some minimal nasal drainage earlier today.  No continuous CSF leak.  Awake and alert.  Oriented and appropriate.  Motor and sensory exam intact.  No CSF leak with posturing this morning.  Begin slow mobilization.  Continue ICU observation.

## 2019-05-09 LAB — CULTURE, BLOOD (ROUTINE X 2)
Culture: NO GROWTH
Culture: NO GROWTH

## 2019-05-09 LAB — RENAL FUNCTION PANEL
Albumin: 2.8 g/dL — ABNORMAL LOW (ref 3.5–5.0)
Anion gap: 11 (ref 5–15)
BUN: 12 mg/dL (ref 8–23)
CO2: 18 mmol/L — ABNORMAL LOW (ref 22–32)
Calcium: 8.8 mg/dL — ABNORMAL LOW (ref 8.9–10.3)
Chloride: 108 mmol/L (ref 98–111)
Creatinine, Ser: 1.48 mg/dL — ABNORMAL HIGH (ref 0.44–1.00)
GFR calc Af Amer: 44 mL/min — ABNORMAL LOW (ref >60)
GFR calc non Af Amer: 38 mL/min — ABNORMAL LOW (ref >60)
Glucose, Bld: 215 mg/dL — ABNORMAL HIGH (ref 70–99)
Phosphorus: 2.3 mg/dL — ABNORMAL LOW (ref 2.5–4.6)
Potassium: 4.5 mmol/L (ref 3.5–5.1)
Sodium: 137 mmol/L (ref 135–145)

## 2019-05-09 LAB — GLUCOSE, CAPILLARY
Glucose-Capillary: 186 mg/dL — ABNORMAL HIGH (ref 70–99)
Glucose-Capillary: 216 mg/dL — ABNORMAL HIGH (ref 70–99)
Glucose-Capillary: 167 mg/dL — ABNORMAL HIGH (ref 70–99)
Glucose-Capillary: 99 mg/dL (ref 70–99)

## 2019-05-09 NOTE — Progress Notes (Signed)
Subjective: Patient reports She feels well although when she was up and about earlier she did notice some drainage in the back of her throat and some did come out of her nose and this was witnessed by the nurse.  It is stopped since she is been back in bed.  Denied any significant headache.  Objective: Vital signs in last 24 hours: Temp:  [98.6 F (37 C)-100.4 F (38 C)] 100.4 F (38 C) (08/31 1200) Pulse Rate:  [85-108] 98 (08/31 1400) Resp:  [13-23] 13 (08/31 1400) BP: (113-169)/(56-154) 124/79 (08/31 1400) SpO2:  [90 %-99 %] 98 % (08/31 1400)  Intake/Output from previous day: 08/30 0701 - 08/31 0700 In: 1983.6 [P.O.:240; I.V.:1743.6] Out: 2190 [Urine:2190] Intake/Output this shift: Total I/O In: 487.9 [I.V.:487.9] Out: 300 [Urine:300]  Currently patient lying in bed head of the bed 30 degrees she is not noticing any drainage in the back of her throat no headache.  Neurologically nonfocal  Lab Results: Recent Labs    05/06/19 2251  WBC 9.6  HGB 7.3*  HCT 23.1*  PLT 197   BMET Recent Labs    05/08/19 0554 05/09/19 0615  NA 138 137  K 4.2 4.5  CL 108 108  CO2 20* 18*  GLUCOSE 145* 215*  BUN 20 12  CREATININE 1.38* 1.48*  CALCIUM 9.0 8.8*    Studies/Results: No results found.  Assessment/Plan: Patient status post second CSF leak revision that included lumbar drain placement and endonasal repair and patch.  Evidence of CSF leak this morning she is currently not leaking and draining.  Hopefully that may have just represented some trapped spinal fluid in the sinuses that is now draining.  However it is concerning.  I would leave her in bed 24 to 48 hours with bathroom privileges only and then will challenge her again.  If she continues to drain CSF I fear the only solution would be a ventriculoperitoneal shunt.  LOS: 24 days     Umair Rosiles P 05/09/2019, 4:51 PM

## 2019-05-09 NOTE — Progress Notes (Signed)
Patient ambulated to chair at 0915 with zero drainage. At 1045 patient called out for bathroom assistance. Patient had noted drainage that started in the back of her throat and then progressed to out of her nose when ambulating back to bed. Will continue to monitor. Lianne Bushy RN BSN.

## 2019-05-10 LAB — RENAL FUNCTION PANEL
Albumin: 2.9 g/dL — ABNORMAL LOW (ref 3.5–5.0)
Anion gap: 10 (ref 5–15)
BUN: 11 mg/dL (ref 8–23)
CO2: 19 mmol/L — ABNORMAL LOW (ref 22–32)
Calcium: 9.1 mg/dL (ref 8.9–10.3)
Chloride: 109 mmol/L (ref 98–111)
Creatinine, Ser: 1.34 mg/dL — ABNORMAL HIGH (ref 0.44–1.00)
GFR calc Af Amer: 49 mL/min — ABNORMAL LOW (ref >60)
GFR calc non Af Amer: 43 mL/min — ABNORMAL LOW (ref >60)
Glucose, Bld: 136 mg/dL — ABNORMAL HIGH (ref 70–99)
Phosphorus: 3.3 mg/dL (ref 2.5–4.6)
Potassium: 4.3 mmol/L (ref 3.5–5.1)
Sodium: 138 mmol/L (ref 135–145)

## 2019-05-10 LAB — GLUCOSE, CAPILLARY
Glucose-Capillary: 158 mg/dL — ABNORMAL HIGH (ref 70–99)
Glucose-Capillary: 199 mg/dL — ABNORMAL HIGH (ref 70–99)
Glucose-Capillary: 185 mg/dL — ABNORMAL HIGH (ref 70–99)
Glucose-Capillary: 169 mg/dL — ABNORMAL HIGH (ref 70–99)

## 2019-05-10 NOTE — Progress Notes (Signed)
Subjective: Patient reports Overall patient doing well had a quiet night noticed a very small amount of drainage this morning  Objective: Vital signs in last 24 hours: Temp:  [98 F (36.7 C)-100.7 F (38.2 C)] 99.7 F (37.6 C) (09/01 0400) Pulse Rate:  [76-108] 98 (09/01 0600) Resp:  [13-25] 16 (09/01 0000) BP: (109-169)/(65-154) 120/71 (09/01 0600) SpO2:  [94 %-98 %] 97 % (09/01 0600)  Intake/Output from previous day: 08/31 0701 - 09/01 0700 In: 2166.1 [Owens.O.:480; I.V.:1686.1] Out: 2300 [Urine:2300] Intake/Output this shift: No intake/output data recorded.  Neurologically nonfocal no evidence of drainage currently  Lab Results: No results for input(s): WBC, HGB, HCT, PLT in the last 72 hours. BMET Recent Labs    05/09/19 0615 05/10/19 0329  NA 137 138  K 4.5 4.3  CL 108 109  CO2 18* 19*  GLUCOSE 215* 136*  BUN 12 11  CREATININE 1.48* 1.34*  CALCIUM 8.8* 9.1    Studies/Results: No results found.  Assessment/Plan: Continue bedrest will transfer to progressive unit  LOS: 25 days     [name] 05/10/2019, 7:16 AM

## 2019-05-10 NOTE — Progress Notes (Signed)
Nutrition Follow-up  DOCUMENTATION CODES:   Obesity unspecified  INTERVENTION:   -Continue Ensure Enlive po BID, each supplement provides 350 kcal and 20 grams of protein -MVI with minerals daily -Magic cup TID with meals, each supplement provides 290 kcal and 9 grams of protein  NUTRITION DIAGNOSIS:   Inadequate oral intake related to decreased appetite as evidenced by meal completion < 50%.  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  Pt with CSF leak s/p multi-layered repair but developed post-op CSF rhinorrhea.  8/29- lumbar drain removed  Reviewed I/O's: -58.9 ml x 24 hours and -8.7 L since 04/26/19  UOP: 2.3 L  Per neurosurgery notes, pt with small amount of drainage noted and was placed on bedrest for 24-48 hours. If drainage does not improve, pt may require ventriculoperitoneal shunt. Plan to transfer from ICU to progressive care unit today.   Pt sitting up in bed; nurse tech assisting with meal of hamburger and french fries. Pt with decreased appetite; noted meal completion of 10-50%. Pt is consuming Ensure supplements.   Medications reviewed and include 0.9% sodium chloride infusion @ 75 ml/hr.   Labs reviewed: CBGS: 99-216 (inpatient orders for glycemic control are 0-20 units insulin aspart TID with meals and 0-5 units insulin aspart q HS).   Diet Order:   Diet Order            Diet Heart Room service appropriate? Yes with Assist; Fluid consistency: Thin  Diet effective now              EDUCATION NEEDS:   Education needs have been addressed  Skin:  Skin Assessment: Skin Integrity Issues: Skin Integrity Issues:: Incisions, Other  (Comment) Incisions: closed back, abdomen, and nose Other: non pressure wounds to rt and lt labia  Last BM:  05/09/19  Height:   Ht Readings from Last 1 Encounters:  04/24/19 5\' 6"  (1.676 m)    Weight:   Wt Readings from Last 1 Encounters:  04/24/19 107.5 kg    Ideal Body Weight:  59 kg  BMI:  Body mass index is 38.25 kg/m.  Estimated Nutritional Needs:   Kcal:  2000-2200  Protein:  100-120 grams  Fluid:  > 2 L/day    Colie Fugitt A. Jimmye Norman, RD, LDN, [place] Registered Dietitian II Certified Diabetes Care and Education Specialist Pager: [phone] After hours Pager: [phone]

## 2019-05-11 LAB — GLUCOSE, CAPILLARY
Glucose-Capillary: 156 mg/dL — ABNORMAL HIGH (ref 70–99)
Glucose-Capillary: 219 mg/dL — ABNORMAL HIGH (ref 70–99)
Glucose-Capillary: 195 mg/dL — ABNORMAL HIGH (ref 70–99)
Glucose-Capillary: 175 mg/dL — ABNORMAL HIGH (ref 70–99)

## 2019-05-11 LAB — CULTURE, BLOOD (ROUTINE X 2)
Culture: NO GROWTH
Culture: NO GROWTH
Special Requests: ADEQUATE
Special Requests: ADEQUATE

## 2019-05-11 LAB — RENAL FUNCTION PANEL
Albumin: 2.7 g/dL — ABNORMAL LOW (ref 3.5–5.0)
Anion gap: 11 (ref 5–15)
BUN: 9 mg/dL (ref 8–23)
CO2: 19 mmol/L — ABNORMAL LOW (ref 22–32)
Calcium: 8.9 mg/dL (ref 8.9–10.3)
Chloride: 109 mmol/L (ref 98–111)
Creatinine, Ser: 1.22 mg/dL — ABNORMAL HIGH (ref 0.44–1.00)
GFR calc Af Amer: 55 mL/min — ABNORMAL LOW (ref >60)
GFR calc non Af Amer: 48 mL/min — ABNORMAL LOW (ref >60)
Glucose, Bld: 138 mg/dL — ABNORMAL HIGH (ref 70–99)
Phosphorus: 3.5 mg/dL (ref 2.5–4.6)
Potassium: 4.4 mmol/L (ref 3.5–5.1)
Sodium: 139 mmol/L (ref 135–145)

## 2019-05-11 NOTE — Progress Notes (Signed)
Overall looks good today.  No complaints of nasal drainage.  No headaches.  Afebrile.  Vital signs stable.  She is awake and alert.  She is oriented and appropriate.  Speech is fluent.  Motor and sensory function intact.  Overall looks better.  Continue light activity and observation.  Possible discharge home tomorrow if no evidence of ongoing CSF leak.

## 2019-05-12 ENCOUNTER — Inpatient Hospital Stay (HOSPITAL_COMMUNITY): Payer: BC Managed Care – PPO

## 2019-05-12 DIAGNOSIS — E559 Vitamin D deficiency, unspecified: Secondary | ICD-10-CM | POA: Insufficient documentation

## 2019-05-12 DIAGNOSIS — D497 Neoplasm of unspecified behavior of endocrine glands and other parts of nervous system: Secondary | ICD-10-CM | POA: Insufficient documentation

## 2019-05-12 DIAGNOSIS — R7989 Other specified abnormal findings of blood chemistry: Secondary | ICD-10-CM | POA: Insufficient documentation

## 2019-05-12 DIAGNOSIS — M1990 Unspecified osteoarthritis, unspecified site: Secondary | ICD-10-CM | POA: Insufficient documentation

## 2019-05-12 DIAGNOSIS — R799 Abnormal finding of blood chemistry, unspecified: Secondary | ICD-10-CM | POA: Insufficient documentation

## 2019-05-12 LAB — RENAL FUNCTION PANEL
Albumin: 2.9 g/dL — ABNORMAL LOW (ref 3.5–5.0)
Anion gap: 13 (ref 5–15)
BUN: 12 mg/dL (ref 8–23)
CO2: 19 mmol/L — ABNORMAL LOW (ref 22–32)
Calcium: 8.7 mg/dL — ABNORMAL LOW (ref 8.9–10.3)
Chloride: 106 mmol/L (ref 98–111)
Creatinine, Ser: 1.35 mg/dL — ABNORMAL HIGH (ref 0.44–1.00)
GFR calc Af Amer: 49 mL/min — ABNORMAL LOW (ref >60)
GFR calc non Af Amer: 42 mL/min — ABNORMAL LOW (ref >60)
Glucose, Bld: 154 mg/dL — ABNORMAL HIGH (ref 70–99)
Phosphorus: 3.2 mg/dL (ref 2.5–4.6)
Potassium: 4.3 mmol/L (ref 3.5–5.1)
Sodium: 138 mmol/L (ref 135–145)

## 2019-05-12 LAB — GLUCOSE, CAPILLARY
Glucose-Capillary: 146 mg/dL — ABNORMAL HIGH (ref 70–99)
Glucose-Capillary: 159 mg/dL — ABNORMAL HIGH (ref 70–99)
Glucose-Capillary: 158 mg/dL — ABNORMAL HIGH (ref 70–99)
Glucose-Capillary: 138 mg/dL — ABNORMAL HIGH (ref 70–99)

## 2019-05-12 NOTE — Progress Notes (Signed)
Patient reports of brief incident of nasal drainage worrisome for CSF yesterday.  Currently staying dry.  No headache.  No new neurologic symptoms.  No fevers.  Neurologic exam is stable.  Check follow-up head CT scan today.  Continue observation.

## 2019-05-13 LAB — GLUCOSE, CAPILLARY
Glucose-Capillary: 146 mg/dL — ABNORMAL HIGH (ref 70–99)
Glucose-Capillary: 213 mg/dL — ABNORMAL HIGH (ref 70–99)
Glucose-Capillary: 159 mg/dL — ABNORMAL HIGH (ref 70–99)
Glucose-Capillary: 175 mg/dL — ABNORMAL HIGH (ref 70–99)

## 2019-05-13 LAB — RENAL FUNCTION PANEL
Albumin: 2.7 g/dL — ABNORMAL LOW (ref 3.5–5.0)
Anion gap: 10 (ref 5–15)
BUN: 10 mg/dL (ref 8–23)
CO2: 18 mmol/L — ABNORMAL LOW (ref 22–32)
Calcium: 8.8 mg/dL — ABNORMAL LOW (ref 8.9–10.3)
Chloride: 111 mmol/L (ref 98–111)
Creatinine, Ser: 1.32 mg/dL — ABNORMAL HIGH (ref 0.44–1.00)
GFR calc Af Amer: 50 mL/min — ABNORMAL LOW (ref >60)
GFR calc non Af Amer: 43 mL/min — ABNORMAL LOW (ref >60)
Glucose, Bld: 132 mg/dL — ABNORMAL HIGH (ref 70–99)
Phosphorus: 3.7 mg/dL (ref 2.5–4.6)
Potassium: 4.9 mmol/L (ref 3.5–5.1)
Sodium: 139 mmol/L (ref 135–145)

## 2019-05-13 NOTE — Progress Notes (Signed)
Overall looks good.  No headache.  Patient continues to report some occasional nasal drainage.  No overt CSF leak at present.  Follow-up head CT scan looks good.  No evidence of intracranial air.  Overall stable.  Continue hospital observation.

## 2019-05-14 LAB — RENAL FUNCTION PANEL
Albumin: 2.8 g/dL — ABNORMAL LOW (ref 3.5–5.0)
Anion gap: 12 (ref 5–15)
BUN: 8 mg/dL (ref 8–23)
CO2: 19 mmol/L — ABNORMAL LOW (ref 22–32)
Calcium: 9 mg/dL (ref 8.9–10.3)
Chloride: 109 mmol/L (ref 98–111)
Creatinine, Ser: 1.27 mg/dL — ABNORMAL HIGH (ref 0.44–1.00)
GFR calc Af Amer: 53 mL/min — ABNORMAL LOW (ref >60)
GFR calc non Af Amer: 46 mL/min — ABNORMAL LOW (ref >60)
Glucose, Bld: 152 mg/dL — ABNORMAL HIGH (ref 70–99)
Phosphorus: 3.9 mg/dL (ref 2.5–4.6)
Potassium: 4.7 mmol/L (ref 3.5–5.1)
Sodium: 140 mmol/L (ref 135–145)

## 2019-05-14 LAB — GLUCOSE, CAPILLARY
Glucose-Capillary: 152 mg/dL — ABNORMAL HIGH (ref 70–99)
Glucose-Capillary: 174 mg/dL — ABNORMAL HIGH (ref 70–99)
Glucose-Capillary: 193 mg/dL — ABNORMAL HIGH (ref 70–99)
Glucose-Capillary: 134 mg/dL — ABNORMAL HIGH (ref 70–99)

## 2019-05-14 NOTE — Progress Notes (Signed)
Patient ID: [name], female   DOB: Feb 28, 1956, 63 y.o.   MRN: [id number]  Doing ok. No drainage overnight or headaches. States that she only drains when she rolls over to her left side, although this did not happen this morning. Stable, continue observation.

## 2019-05-15 ENCOUNTER — Inpatient Hospital Stay (HOSPITAL_COMMUNITY): Payer: BC Managed Care – PPO

## 2019-05-15 LAB — RENAL FUNCTION PANEL
Albumin: 2.7 g/dL — ABNORMAL LOW (ref 3.5–5.0)
Anion gap: 10 (ref 5–15)
BUN: 8 mg/dL (ref 8–23)
CO2: 21 mmol/L — ABNORMAL LOW (ref 22–32)
Calcium: 8.5 mg/dL — ABNORMAL LOW (ref 8.9–10.3)
Chloride: 108 mmol/L (ref 98–111)
Creatinine, Ser: 1.24 mg/dL — ABNORMAL HIGH (ref 0.44–1.00)
GFR calc Af Amer: 54 mL/min — ABNORMAL LOW (ref >60)
GFR calc non Af Amer: 47 mL/min — ABNORMAL LOW (ref >60)
Glucose, Bld: 142 mg/dL — ABNORMAL HIGH (ref 70–99)
Phosphorus: 3.8 mg/dL (ref 2.5–4.6)
Potassium: 4.1 mmol/L (ref 3.5–5.1)
Sodium: 139 mmol/L (ref 135–145)

## 2019-05-15 LAB — BASIC METABOLIC PANEL
Anion gap: 14 (ref 5–15)
BUN: 7 mg/dL — ABNORMAL LOW (ref 8–23)
CO2: 19 mmol/L — ABNORMAL LOW (ref 22–32)
Calcium: 8.5 mg/dL — ABNORMAL LOW (ref 8.9–10.3)
Chloride: 102 mmol/L (ref 98–111)
Creatinine, Ser: 1.46 mg/dL — ABNORMAL HIGH (ref 0.44–1.00)
GFR calc Af Amer: 45 mL/min — ABNORMAL LOW (ref >60)
GFR calc non Af Amer: 38 mL/min — ABNORMAL LOW (ref >60)
Glucose, Bld: 224 mg/dL — ABNORMAL HIGH (ref 70–99)
Potassium: 4 mmol/L (ref 3.5–5.1)
Sodium: 135 mmol/L (ref 135–145)

## 2019-05-15 LAB — CBC
HCT: 28.9 % — ABNORMAL LOW (ref 36.0–46.0)
Hemoglobin: 8.7 g/dL — ABNORMAL LOW (ref 12.0–15.0)
MCH: 26.7 pg (ref 26.0–34.0)
MCHC: 30.1 g/dL (ref 30.0–36.0)
MCV: 88.7 fL (ref 80.0–100.0)
Platelets: 295 10*3/uL (ref 150–400)
RBC: 3.26 MIL/uL — ABNORMAL LOW (ref 3.87–5.11)
RDW: 15.5 % (ref 11.5–15.5)
WBC: 10.5 10*3/uL (ref 4.0–10.5)
nRBC: 0.3 % — ABNORMAL HIGH (ref 0.0–0.2)

## 2019-05-15 LAB — GLUCOSE, CAPILLARY
Glucose-Capillary: 165 mg/dL — ABNORMAL HIGH (ref 70–99)
Glucose-Capillary: 145 mg/dL — ABNORMAL HIGH (ref 70–99)
Glucose-Capillary: 148 mg/dL — ABNORMAL HIGH (ref 70–99)
Glucose-Capillary: 209 mg/dL — ABNORMAL HIGH (ref 70–99)
Glucose-Capillary: 198 mg/dL — ABNORMAL HIGH (ref 70–99)

## 2019-05-15 MED ORDER — PROMETHAZINE HCL 25 MG/ML IJ SOLN
12.5000 mg | INTRAMUSCULAR | Status: DC | PRN
  Administered 2019-05-15: 25 mg via INTRAVENOUS
  Filled 2019-05-15: qty 1

## 2019-05-15 MED ORDER — HEPARIN SODIUM (PORCINE) 5000 UNIT/ML IJ SOLN: 5000.0000 [IU] | Freq: Three times a day (TID) | INTRAMUSCULAR | Status: DC

## 2019-05-15 MED ORDER — SODIUM CHLORIDE 0.9 % IV SOLN
2.0000 g | Freq: Two times a day (BID) | INTRAVENOUS | Status: DC
  Administered 2019-05-16 (×2): 2 g via INTRAVENOUS
  Filled 2019-05-15 (×2): qty 2
  Filled 2019-05-15: qty 20

## 2019-05-15 MED ORDER — VANCOMYCIN HCL IN DEXTROSE 1-5 GM/200ML-% IV SOLN
1000.0000 mg | Freq: Two times a day (BID) | INTRAVENOUS | Status: DC
  Administered 2019-05-16 – 2019-05-17 (×5): 1000 mg via INTRAVENOUS
  Filled 2019-05-15 (×5): qty 200

## 2019-05-15 NOTE — Progress Notes (Signed)
   Providing Compassionate, Quality Care - Together   Subjective: Patient reports no issues overnight. No nasal drainage since yesterday.  Objective: Vital signs in last 24 hours: Temp:  [97.5 F (36.4 C)-99.7 F (37.6 C)] 99.7 F (37.6 C) (09/06 0754) Pulse Rate:  [87-97] 97 (09/06 0754) Resp:  [16-22] 20 (09/06 0754) BP: (118-142)/(60-83) 121/76 (09/06 0754) SpO2:  [93 %-97 %] 95 % (09/06 0754)  Intake/Output from previous day: 09/05 0701 - 09/06 0700 In: 220 [P.O.:220] Out: 1975 [Urine:1975] Intake/Output this shift: No intake/output data recorded.  Alert and oriented x 4 PERRLA Speech fluent CN II-XII grossly intact MAE, Strength 5/5 BUE, BLE Sensation intact   Lab Results: No results for input(s): WBC, HGB, HCT, PLT in the last 72 hours. BMET Recent Labs    05/14/19 0746 05/15/19 0254  NA 140 139  K 4.7 4.1  CL 109 108  CO2 19* 21*  GLUCOSE 152* 142*  BUN 8 8  CREATININE 1.27* 1.24*  CALCIUM 9.0 8.5*   Ct Head Wo Contrast  Result Date: 05/12/2019 CLINICAL DATA:  CSF fistula.  Rule out CSF leak. Trans-sphenoidal resection of pituitary tumor 04/15/2019. Trans-sphenoidal repair of CSF leak 04/29/2019 EXAM: CT HEAD WITHOUT CONTRAST TECHNIQUE: Contiguous axial images were obtained from the base of the skull through the vertex without intravenous contrast. COMPARISON:  CT head 05/03/2019 FINDINGS: Brain: Ventricle size normal. Mild atrophy. No intracranial gas is identified. Intraventricular gas has resolved since the prior study. Negative for acute infarct, hemorrhage, mass. No fluid collection or midline shift. Vascular: Negative for hyperdense vessel Skull: Bony defect in the anterior wall of the sella from prior surgery. Small bone fragment in the sphenoid sinus. Fat graft overlying the bony defect. Sinuses/Orbits: Extensive mucosal thickening of the ethmoid and sphenoid sinuses. Frontal and maxillary sinuses clear. Other: None IMPRESSION: No acute intracranial  abnormality. Intracranial gas has resolved since the prior study. Postop trans-sphenoidal resection of pituitary tumor. There is a bony defect in the anterior wall of the sella which has been repaired with soft tissue graft containing fat. There is extensive mucosal edema in the ethmoid and sphenoid sinuses bilaterally. Electronically Signed   By: Franchot Gallo M.D.   On: 05/12/2019 13:30     Assessment/Plan: 63 y.o. 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 Failed bedside lumbar drain placement. 8/11 LD placed under fluoro, minimal output, 8/14 LD removed; 8/16 rhinorrhea, LD replaced, 8/19 somnolence, CTH w/ large volume pneumocephalus, LD clamped, 8/21 s/p repeat endonasal skull base reconstruction, 8/24 episode of rhinorrhea, LD increased to 15cc/hr, 8/25 rpt CTH with resolving pneumocephalus. 8/28 drain clamped. 8/29 no evidence of CSF rhinorrhea when leaned forward. Lumbar drain removed. 8/30 minimal nasal drainage. 9/4 follow-up CT looked good.   LOS: 30 days    -Continue hospital observation   Viona Gilmore, [place], AGNP-C Nurse Practitioner  [facility] [place] [address], [place], [place] [address] P: [phone]    F: [phone]  05/15/2019, 8:39 AM

## 2019-05-15 NOTE — Progress Notes (Signed)
1900: During report, pt w/ N/V, fever, tachy, and O2 sats in high 80s on RA. 2L [place] applied and rectal temp taken showed 104.4 temp. Zofran and tylenol supp also given. On-call provider paged and received multiple orders for cultures and lumbar puncture.   2240: Pt returned from procedure and vitals taken. Phenergan given. Lab advised pt is back for lab draw. Will continue to monitor.   0218: Received call for critical lab value of CSF WBC 830. On call provider paged.   0235: Pt's HR jumped to the 130s. RN checked on pt and pt lying in bed w/ eyes open. Pt w/ AMS and only able to tell her first name, unable to follow commands. Rapid Response called. On-call provider paged again notifying of vitals and change in status. No new orders received.   0251: Rectal temp re-checked 103.9 temp. Tylenol supp given. Ice packs re-applied.   0545: Pt A&O x3 again.

## 2019-05-15 NOTE — Progress Notes (Addendum)
Pharmacy Antibiotic Note  [name] is a 63 y.o. female admitted on 04/15/2019 status post second CSF leak revision that included lumbar drain placement and endonasal repair and patch.  Pharmacy has been consulted for vancomycin dosing empirically for meningitis.  Plan: Increase ceftriaxone to 2gm IV q12h Continue vancomycin 1000mg  IV q12h as ordered Levels as needed.  Monitor for clinical course, LOT, and deescalation  Height: 5\' 6"  (167.6 cm) Weight: 236 lb 15.9 oz (107.5 kg) IBW/kg (Calculated) : 59.3  Temp (24hrs), Avg:100.4 F (38 C), Min:97.5 F (36.4 C), Max:104.4 F (40.2 C)  Recent Labs  Lab 05/12/19 0112 05/13/19 0551 05/14/19 0746 05/15/19 0254 05/15/19 2025  CREATININE 1.35* 1.32* 1.27* 1.24* 1.46*    Estimated Creatinine Clearance: 50.2 mL/min (A) (by C-G formula based on SCr of 1.46 mg/dL (H)).    No Known Allergies    Dwayne A. Levada Dy, PharmD, BCPS, FNKF Clinical Pharmacist [place] Please utilize [facility] for appropriate phone number to reach the unit pharmacist ([place])   05/15/2019 9:43 PM    ADDENDUM: Dr. Baxter Flattery with ID has recommended antibiotic coverage with Vancomycin and Cefepime for meningitis.  Will start Cefepime 2gm IV q12h.  [employer], Pharm.D., BCPS Clinical Pharmacist 05/16/2019 4:33 PM

## 2019-05-16 ENCOUNTER — Inpatient Hospital Stay (HOSPITAL_COMMUNITY): Payer: BC Managed Care – PPO | Admitting: Certified Registered Nurse Anesthetist

## 2019-05-16 ENCOUNTER — Encounter (HOSPITAL_COMMUNITY)
Admission: RE | Disposition: A | Discharge status: Rehabilitation Facility | Payer: Self-pay | Source: Home / Self Care | Attending: Neurological Surgery

## 2019-05-16 ENCOUNTER — Inpatient Hospital Stay (HOSPITAL_COMMUNITY): Payer: BC Managed Care – PPO

## 2019-05-16 ENCOUNTER — Other Ambulatory Visit: Payer: Self-pay

## 2019-05-16 ENCOUNTER — Encounter (HOSPITAL_COMMUNITY): Payer: Self-pay | Admitting: *Deleted

## 2019-05-16 HISTORY — PX: PLACEMENT OF LUMBAR DRAIN: SHX6028

## 2019-05-16 LAB — GLUCOSE, CAPILLARY
Glucose-Capillary: 179 mg/dL — ABNORMAL HIGH (ref 70–99)
Glucose-Capillary: 188 mg/dL — ABNORMAL HIGH (ref 70–99)
Glucose-Capillary: 170 mg/dL — ABNORMAL HIGH (ref 70–99)
Glucose-Capillary: 233 mg/dL — ABNORMAL HIGH (ref 70–99)

## 2019-05-16 LAB — URINALYSIS, ROUTINE W REFLEX MICROSCOPIC
Bilirubin Urine: NEGATIVE
Bilirubin Urine: NEGATIVE
Glucose, UA: 150 mg/dL — AB
Glucose, UA: NEGATIVE mg/dL
Hgb urine dipstick: NEGATIVE
Ketones, ur: NEGATIVE mg/dL
Ketones, ur: NEGATIVE mg/dL
Nitrite: NEGATIVE
Nitrite: NEGATIVE
Protein, ur: NEGATIVE mg/dL
Protein, ur: NEGATIVE mg/dL
Specific Gravity, Urine: 1.006 (ref 1.005–1.030)
Specific Gravity, Urine: 1.009 (ref 1.005–1.030)
WBC, UA: >50 WBC/hpf — ABNORMAL HIGH (ref 0–5)
pH: 7 (ref 5.0–8.0)
pH: 7 (ref 5.0–8.0)

## 2019-05-16 LAB — CSF CELL COUNT WITH DIFFERENTIAL
Eosinophils, CSF: 1 % (ref 0–1)
Lymphs, CSF: 1 % — ABNORMAL LOW (ref 40–80)
Monocyte-Macrophage-Spinal Fluid: 2 % — ABNORMAL LOW (ref 15–45)
RBC Count, CSF: 168 /mm3 — ABNORMAL HIGH
Segmented Neutrophils-CSF: 96 % — ABNORMAL HIGH (ref 0–6)
Tube #: 3
WBC, CSF: 830 /mm3 (ref 0–5)

## 2019-05-16 LAB — GLUCOSE, CSF: Glucose, CSF: 32 mg/dL — ABNORMAL LOW (ref 40–70)

## 2019-05-16 LAB — RENAL FUNCTION PANEL
Albumin: 3 g/dL — ABNORMAL LOW (ref 3.5–5.0)
Anion gap: 14 (ref 5–15)
BUN: 6 mg/dL — ABNORMAL LOW (ref 8–23)
CO2: 20 mmol/L — ABNORMAL LOW (ref 22–32)
Calcium: 8.7 mg/dL — ABNORMAL LOW (ref 8.9–10.3)
Chloride: 102 mmol/L (ref 98–111)
Creatinine, Ser: 1.59 mg/dL — ABNORMAL HIGH (ref 0.44–1.00)
GFR calc Af Amer: 40 mL/min — ABNORMAL LOW (ref >60)
GFR calc non Af Amer: 35 mL/min — ABNORMAL LOW (ref >60)
Glucose, Bld: 230 mg/dL — ABNORMAL HIGH (ref 70–99)
Phosphorus: 3.5 mg/dL (ref 2.5–4.6)
Potassium: 4.4 mmol/L (ref 3.5–5.1)
Sodium: 136 mmol/L (ref 135–145)

## 2019-05-16 LAB — PROTEIN, CSF: Total  Protein, CSF: 488 mg/dL — ABNORMAL HIGH (ref 15–45)

## 2019-05-16 SURGERY — PLACEMENT OF LUMBAR DRAIN
Anesthesia: General

## 2019-05-16 MED ORDER — HYDROMORPHONE HCL 1 MG/ML IJ SOLN: 0.2500 mg | INTRAMUSCULAR | Status: DC | PRN

## 2019-05-16 MED ORDER — ACETAMINOPHEN 10 MG/ML IV SOLN
INTRAVENOUS | Status: AC
  Filled 2019-05-16: qty 100

## 2019-05-16 MED ORDER — ONDANSETRON HCL 4 MG/2ML IJ SOLN
INTRAMUSCULAR | Status: DC | PRN
  Administered 2019-05-16: 4 mg via INTRAVENOUS

## 2019-05-16 MED ORDER — FENTANYL CITRATE (PF) 250 MCG/5ML IJ SOLN
INTRAMUSCULAR | Status: AC
  Filled 2019-05-16: qty 5

## 2019-05-16 MED ORDER — ROCURONIUM BROMIDE 10 MG/ML (PF) SYRINGE
PREFILLED_SYRINGE | INTRAVENOUS | Status: AC
  Filled 2019-05-16: qty 10

## 2019-05-16 MED ORDER — LIDOCAINE 2% (20 MG/ML) 5 ML SYRINGE
INTRAMUSCULAR | Status: DC | PRN
  Administered 2019-05-16: 60 mg via INTRAVENOUS

## 2019-05-16 MED ORDER — ACETAMINOPHEN 10 MG/ML IV SOLN
1000.0000 mg | Freq: Once | INTRAVENOUS | Status: AC
  Administered 2019-05-16: 1000 mg via INTRAVENOUS

## 2019-05-16 MED ORDER — MIDAZOLAM HCL 2 MG/2ML IJ SOLN
INTRAMUSCULAR | Status: AC
  Filled 2019-05-16: qty 2

## 2019-05-16 MED ORDER — PROMETHAZINE HCL 25 MG/ML IJ SOLN: 6.2500 mg | INTRAMUSCULAR | Status: DC | PRN

## 2019-05-16 MED ORDER — OXYCODONE HCL 5 MG/5ML PO SOLN: 5.0000 mg | Freq: Once | ORAL | Status: DC | PRN

## 2019-05-16 MED ORDER — BUPIVACAINE-EPINEPHRINE (PF) 0.5% -1:200000 IJ SOLN
INTRAMUSCULAR | Status: AC
  Filled 2019-05-16: qty 30

## 2019-05-16 MED ORDER — DEXAMETHASONE SODIUM PHOSPHATE 10 MG/ML IJ SOLN
INTRAMUSCULAR | Status: AC
  Filled 2019-05-16: qty 2

## 2019-05-16 MED ORDER — SODIUM CHLORIDE 0.9 % IV SOLN
2.0000 g | Freq: Two times a day (BID) | INTRAVENOUS | Status: AC
  Administered 2019-05-16 – 2019-05-27 (×23): 2 g via INTRAVENOUS
  Filled 2019-05-16 (×25): qty 2

## 2019-05-16 MED ORDER — PROPOFOL 10 MG/ML IV BOLUS
INTRAVENOUS | Status: AC
  Filled 2019-05-16: qty 20

## 2019-05-16 MED ORDER — ROCURONIUM BROMIDE 10 MG/ML (PF) SYRINGE
PREFILLED_SYRINGE | INTRAVENOUS | Status: DC | PRN
  Administered 2019-05-16: 40 mg via INTRAVENOUS

## 2019-05-16 MED ORDER — LIDOCAINE 2% (20 MG/ML) 5 ML SYRINGE
INTRAMUSCULAR | Status: AC
  Filled 2019-05-16: qty 5

## 2019-05-16 MED ORDER — BUPIVACAINE-EPINEPHRINE 0.5% -1:200000 IJ SOLN
INTRAMUSCULAR | Status: DC | PRN
  Administered 2019-05-16: 10 mL

## 2019-05-16 MED ORDER — OXYCODONE HCL 5 MG PO TABS: 5.0000 mg | ORAL_TABLET | Freq: Once | ORAL | Status: DC | PRN

## 2019-05-16 MED ORDER — SUGAMMADEX SODIUM 200 MG/2ML IV SOLN
INTRAVENOUS | Status: DC | PRN
  Administered 2019-05-16: 200 mg via INTRAVENOUS

## 2019-05-16 MED ORDER — EPHEDRINE 5 MG/ML INJ
INTRAVENOUS | Status: AC
  Filled 2019-05-16: qty 10

## 2019-05-16 MED ORDER — SUCCINYLCHOLINE CHLORIDE 200 MG/10ML IV SOSY
PREFILLED_SYRINGE | INTRAVENOUS | Status: AC
  Filled 2019-05-16: qty 10

## 2019-05-16 MED ORDER — ONDANSETRON HCL 4 MG/2ML IJ SOLN
INTRAMUSCULAR | Status: AC
  Filled 2019-05-16: qty 2

## 2019-05-16 MED ORDER — LACTATED RINGERS IV SOLN
INTRAVENOUS | Status: DC | PRN
  Administered 2019-05-16: 16:00:00 via INTRAVENOUS

## 2019-05-16 MED ORDER — 0.9 % SODIUM CHLORIDE (POUR BTL) OPTIME
TOPICAL | Status: DC | PRN
  Administered 2019-05-16: 16:00:00 1000 mL

## 2019-05-16 MED ORDER — DEXAMETHASONE SODIUM PHOSPHATE 10 MG/ML IJ SOLN
INTRAMUSCULAR | Status: DC | PRN
  Administered 2019-05-16: 4 mg via INTRAVENOUS

## 2019-05-16 MED ORDER — PROPOFOL 10 MG/ML IV BOLUS
INTRAVENOUS | Status: DC | PRN
  Administered 2019-05-16 (×2): 50 mg via INTRAVENOUS

## 2019-05-16 MED ORDER — FENTANYL CITRATE (PF) 250 MCG/5ML IJ SOLN
INTRAMUSCULAR | Status: DC | PRN
  Administered 2019-05-16: 100 ug via INTRAVENOUS
  Administered 2019-05-16: 150 ug via INTRAVENOUS

## 2019-05-16 MED ORDER — PHENYLEPHRINE 40 MCG/ML (10ML) SYRINGE FOR IV PUSH (FOR BLOOD PRESSURE SUPPORT)
PREFILLED_SYRINGE | INTRAVENOUS | Status: AC
  Filled 2019-05-16: qty 10

## 2019-05-16 SURGICAL SUPPLY — 54 items
BLADE CLIPPER SURG (BLADE) ×4 IMPLANT
BLADE SURG 10 STRL SS (BLADE) ×4 IMPLANT
BLADE SURG 15 STRL LF DISP TIS (BLADE) ×1 IMPLANT
BLADE SURG 15 STRL SS (BLADE) ×1
BOOT SUTURE AID YELLOW STND (SUTURE) IMPLANT
CABLE BIPOLOR RESECTION CORD (MISCELLANEOUS) ×2 IMPLANT
CANISTER SUCT 3000ML PPV (MISCELLANEOUS) ×2 IMPLANT
CARTRIDGE OIL MAESTRO DRILL (MISCELLANEOUS) ×1 IMPLANT
CATH LUMBAR HERMETIC 14G (Neuro Prosthesis/Implant) ×1 IMPLANT
CATH VENTRIC 35X38 W/TROCAR LG (CATHETERS) IMPLANT
CATHETER LUMBAR HERMETIC 14G (Neuro Prosthesis/Implant) ×2 IMPLANT
COVER BACK TABLE 60X90IN (DRAPES) ×4 IMPLANT
COVER MAYO STAND STRL (DRAPES) ×2 IMPLANT
COVER WAND RF STERILE (DRAPES) ×2 IMPLANT
DIFFUSER DRILL AIR PNEUMATIC (MISCELLANEOUS) ×2 IMPLANT
DRAPE INCISE IOBAN 66X45 STRL (DRAPES) ×2 IMPLANT
DRAPE POUCH INSTRU U-SHP 10X18 (DRAPES) ×2 IMPLANT
DRAPE SURG 17X23 STRL (DRAPES) ×12 IMPLANT
DRSG OPSITE POSTOP 4X6 (GAUZE/BANDAGES/DRESSINGS) ×2 IMPLANT
ELECT CAUTERY BLADE 6.4 (BLADE) ×2 IMPLANT
ELECT REM PT RETURN 9FT ADLT (ELECTROSURGICAL) ×2
ELECTRODE REM PT RTRN 9FT ADLT (ELECTROSURGICAL) ×1 IMPLANT
GAUZE 4X4 16PLY RFD (DISPOSABLE) ×2 IMPLANT
GLOVE BIO SURGEON STRL SZ8 (GLOVE) ×2 IMPLANT
GLOVE BIO SURGEON STRL SZ8.5 (GLOVE) ×2 IMPLANT
GLOVE EXAM NITRILE XL STR (GLOVE) IMPLANT
GOWN STRL REUS W/ TWL LRG LVL3 (GOWN DISPOSABLE) IMPLANT
GOWN STRL REUS W/ TWL XL LVL3 (GOWN DISPOSABLE) IMPLANT
GOWN STRL REUS W/TWL LRG LVL3 (GOWN DISPOSABLE)
GOWN STRL REUS W/TWL XL LVL3 (GOWN DISPOSABLE)
KIT BASIN OR (CUSTOM PROCEDURE TRAY) ×2 IMPLANT
KIT DRAIN CSF ACCUDRAIN (MISCELLANEOUS) IMPLANT
KIT TURNOVER KIT B (KITS) ×2 IMPLANT
NEEDLE HYPO 22GX1.5 SAFETY (NEEDLE) ×2 IMPLANT
NS IRRIG 1000ML POUR BTL (IV SOLUTION) ×2 IMPLANT
OIL CARTRIDGE MAESTRO DRILL (MISCELLANEOUS) ×2
PACK EENT II TURBAN DRAPE (CUSTOM PROCEDURE TRAY) ×2 IMPLANT
PAD ARMBOARD 7.5X6 YLW CONV (MISCELLANEOUS) ×6 IMPLANT
PATTIES SURGICAL .5 X.5 (GAUZE/BANDAGES/DRESSINGS) ×2 IMPLANT
PATTIES SURGICAL 1X1 (DISPOSABLE) IMPLANT
PENCIL BUTTON HOLSTER BLD 10FT (ELECTRODE) ×2 IMPLANT
STAPLER SKIN PROX WIDE 3.9 (STAPLE) ×2 IMPLANT
SUT BONE WAX W31G (SUTURE) ×2 IMPLANT
SUT ETHILON 2 0 FS 18 (SUTURE) ×2 IMPLANT
SUT VIC AB 2-0 CP2 18 (SUTURE) ×2 IMPLANT
SUT VIC AB 3-0 SH 8-18 (SUTURE) ×2 IMPLANT
SYR BULB 3OZ (MISCELLANEOUS) ×2 IMPLANT
SYR CONTROL 10ML LL (SYRINGE) ×2 IMPLANT
TOWEL GREEN STERILE (TOWEL DISPOSABLE) ×2 IMPLANT
TOWEL GREEN STERILE FF (TOWEL DISPOSABLE) ×2 IMPLANT
TRAY FOLEY MTR SLVR 16FR STAT (SET/KITS/TRAYS/PACK) IMPLANT
TUBE CONNECTING 12X1/4 (SUCTIONS) ×2 IMPLANT
UNDERPAD 30X30 (UNDERPADS AND DIAPERS) ×2 IMPLANT
WATER STERILE IRR 1000ML POUR (IV SOLUTION) ×2 IMPLANT

## 2019-05-16 NOTE — Progress Notes (Signed)
Dr Roanna Banning informed that patient is tachycardia, pulse 111 in SS.  No orders given.

## 2019-05-16 NOTE — Op Note (Signed)
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  She has developed a CSF fistula/rhinorrhea.  A lumbar drain was previously placed she has had a revision of her surgery with placement of abdominal fat graft.  The patient was febrile yesterday and underwent cultures and lumbar puncture which demonstrated findings consistent with meningitis.  She has been started on empiric vancomycin and cefepime.  She is developed a recurrent CSF fistula/rhinorrhea.  I discussed the situation with the patient and recommended placement of a lumbar drain to divert her spinal fluid.  I explained the procedure, the risks, benefits and alternatives.  The patient has decided to proceed with that operation.  Preop diagnosis: CSF rhinorrhea, CSF fistula, meningitis  Postop diagnosis: The same  Procedure: Placement of lumbar drain  Surgeon: Dr. Earle Gell  Assistant: Jethro Bolus nurse practitioner  Anesthesia: General tracheal  Estimated blood loss: Minimal  Specimens: None  Drains: None  Complications: None  Description of procedure: The patient was brought to the operating room by the anesthesia team.  General endotracheal anesthesia was induced.  The patient was turned to the prone position on the flat rolls.  Her lumbosacral region was then prepared with Betadine scrub and Betadine solution.  Sterile drapes were applied.  Under fluoroscopic guidance I cannulated the patient's L3-4 interspace with a Touhy needle.  I removed the stylette and there was good brisk flow of cerebrospinal fluid.  I threaded a lumbar drain into the subarachnoid space.  I then remove the Touhy needle over the catheter and guidewire.  I then removed the guidewire.  There was good flow of cerebrospinal fluid from the lumbar drain.  It was connected to a drainage system.  A sterile dressing was applied.  The drapes were removed.  The patient was returned to the supine position by  report all sponge, instrument, and needle counts were correct at the end of this case.

## 2019-05-16 NOTE — Progress Notes (Signed)
Spoke with sister, Margaretha Sheffield, and daughter, [name], by phone. Updated them on the successful placement of the lumbar drain in the OR and plan for antibiotics for infection. All of their questions were answered.  They are requesting a provider contact them at least once a day for updates. They voiced their appreciation for the nursing staff and providers.

## 2019-05-16 NOTE — Progress Notes (Signed)
Subjective: The patient is alert and pleasant.  Objective: Vital signs in last 24 hours: Temp:  [37.8 C-40.2 C] 39.7 C (09/07 1238) Pulse Rate:  [84-126] 107 (09/07 1200) Resp:  [9-31] 14 (09/07 1200) BP: (132-174)/(72-88) 140/81 (09/07 1200) SpO2:  [89 %-100 %] 99 % (09/07 1200) Weight:  [107.5 kg] 107.5 kg (09/07 1312) Estimated body mass index is 38.25 kg/m as calculated from the following:   Height as of this encounter: 5\' 6"  (1.676 m).   Weight as of this encounter: 107.5 kg.   Intake/Output from previous day: 09/06 0701 - 09/07 0700 In: 65 [P.O.:320; IV Piggyback:300] Out: 2201 [Urine:2200; Stool:1] Intake/Output this shift: Total I/O In: 75 [I.V.:75] Out: 850 [Urine:850]  Physical exam the patient is alert and oriented.  She has watery drainage from her nose.  Lab Results: Recent Labs    05/15/19 2309  WBC 10.5  HGB 8.7*  HCT 28.9*  PLT 295   BMET Recent Labs    05/15/19 2025 05/16/19 0325  NA 135 136  K 4.0 4.4  CL 102 102  CO2 19* 20*  GLUCOSE 224* 230*  BUN 7* 6*  CREATININE 1.46* 1.59*  CALCIUM 8.5* 8.7*    Studies/Results: Dg Chest 1 View  Result Date: 05/15/2019 CLINICAL DATA:  63 year old female with fever. EXAM: CHEST  1 VIEW COMPARISON:  Chest radiograph dated 10/28/2018 FINDINGS: Shallow inspiration with minimal bibasilar atelectasis. No focal consolidation, pleural effusion, or pneumothorax. There is mild cardiomegaly. Atherosclerotic calcification of the aortic arch. No acute osseous pathology. IMPRESSION: No active disease. Electronically Signed   By: Anner Crete M.D.   On: 05/15/2019 21:38   Dg Fluoro Guide Lumbar Puncture  Result Date: 05/15/2019 CLINICAL DATA:  Fever EXAM: DIAGNOSTIC LUMBAR PUNCTURE UNDER FLUOROSCOPIC GUIDANCE FLUOROSCOPY TIME:  Fluoroscopy Time:  30 seconds Radiation Exposure Index (if provided by the fluoroscopic device): Number of Acquired Spot Images: 0 PROCEDURE: Informed consent was obtained from the  patient prior to the procedure, including potential complications of headache, allergy, and pain. With the patient prone, the lower back was prepped with Betadine. 1% Lidocaine was used for local anesthesia. Lumbar puncture was performed at the L3-4 level using a 20 gauge needle with return of cloudy CSF with an opening pressure of 17 cm water. Five ml of CSF were obtained for laboratory studies. The patient tolerated the procedure well and there were no apparent complications. IMPRESSION: Technically successful lumbar puncture under fluoroscopic guidance. CSF was cloudy. Electronically Signed   By: Rolm Baptise M.D.   On: 05/15/2019 23:42    Assessment/Plan: CSF fistula, CSF rhinorrhea hydrocephalus: I have discussed the situation with the patient.  We have discussed the various treatment options.  I recommend we place a lumbar drain to hopefully resolve her CSF rhinorrhea.  I described the procedure and the risk including the risks of anesthesia, hemorrhage, infection, nerve injury, catheter malplacement or malfunction, etc.  We have discussed the risks, benefits, expected postop course, likelihood of achieving her goals with, etc.  I have answered all her questions.  She wants to proceed with surgery.  We will do this emergently.  LOS: 31 days     Ophelia Charter 05/16/2019, 2:36 PM

## 2019-05-16 NOTE — Progress Notes (Signed)
Called by Nira Conn RN for pt with AMS and requesting second assessment -Pt is febrile (103.9 F rectal) and suspected meningitis -Pt is alert, disoriented x4, follows intermittent commands and identifies objects -MAE x4 with equal strength but some generalized weakness -Neuro change was communicated to primary svc according to Heather.  -I recommended treating fever (tylenol). Will continue to monitor.

## 2019-05-16 NOTE — Anesthesia Preprocedure Evaluation (Addendum)
Anesthesia Evaluation  Patient identified by MRN, date of birth, ID band Patient awake    Reviewed: Allergy & Precautions, NPO status , Patient's Chart, lab work & pertinent test results  Airway Mallampati: III       Dental  (+) Edentulous Upper, Edentulous Lower   Pulmonary former smoker,    Pulmonary exam normal        Cardiovascular hypertension, Pt. on medications  Rhythm:Regular Rate:Tachycardia  ECG: ST 111, LVH   Neuro/Psych  Headaches, Meningitis   Neuromuscular disease negative psych ROS   GI/Hepatic negative GI ROS, Neg liver ROS,   Endo/Other  diabetes, Oral Hypoglycemic AgentsBreast CA s/p chemo and rads  Renal/GU Renal InsufficiencyRenal disease     Musculoskeletal negative musculoskeletal ROS (+)   Abdominal   Peds  Hematology  (+) anemia , HLD   Anesthesia Other Findings CSF leak  Reproductive/Obstetrics                          Anesthesia Physical Anesthesia Plan  ASA: IV  Anesthesia Plan: General   Post-op Pain Management:    Induction: Intravenous  PONV Risk Score and Plan: 3 and Ondansetron, Dexamethasone and Treatment may vary due to age or medical condition  Airway Management Planned: Oral ETT  Additional Equipment:   Intra-op Plan:   Post-operative Plan: Possible Post-op intubation/ventilation and Extubation in OR  Informed Consent: I have reviewed the patients History and Physical, chart, labs and discussed the procedure including the risks, benefits and alternatives for the proposed anesthesia with the patient or authorized representative who has indicated his/her understanding and acceptance.       Plan Discussed with: CRNA  Anesthesia Plan Comments: (Potential central line placement discussed)      Anesthesia Quick Evaluation

## 2019-05-16 NOTE — Anesthesia Postprocedure Evaluation (Signed)
Anesthesia Post Note  Patient: [name]  Procedure(s) Performed: PLACEMENT OF LUMBAR DRAIN (N/A )     Patient location during evaluation: PACU Anesthesia Type: General Level of consciousness: awake and alert Pain management: pain level controlled Vital Signs Assessment: post-procedure vital signs reviewed and stable Respiratory status: spontaneous breathing, nonlabored ventilation, respiratory function stable and patient connected to nasal cannula oxygen Cardiovascular status: blood pressure returned to baseline, stable and tachycardic Postop Assessment: no apparent nausea or vomiting Anesthetic complications: no    Last Vitals:  Vitals:   05/16/19 1804 05/16/19 1815  BP: 121/78   Pulse: (!) 103   Resp: 18   Temp:  37.4 C  SpO2: 99%     Last Pain:  Vitals:   05/16/19 1804  TempSrc:   PainSc: 0-No pain                 Ryan P Ellender

## 2019-05-16 NOTE — Anesthesia Procedure Notes (Signed)
Procedure Name: Intubation Date/Time: 05/16/2019 3:51 PM Performed by: Julieta Bellini, CRNA Pre-anesthesia Checklist: Patient identified, Emergency Drugs available, Suction available and Patient being monitored Patient Re-evaluated:Patient Re-evaluated prior to induction Oxygen Delivery Method: Circle system utilized Preoxygenation: Pre-oxygenation with 100% oxygen Induction Type: IV induction Ventilation: Mask ventilation without difficulty and Oral airway inserted - appropriate to patient size Laryngoscope Size: Mac and 3 Grade View: Grade I Tube type: Oral Tube size: 7.0 mm Number of attempts: 1 Airway Equipment and Method: Stylet Placement Confirmation: ETT inserted through vocal cords under direct vision,  positive ETCO2 and breath sounds checked- equal and bilateral Secured at: 22 cm Tube secured with: Tape Dental Injury: Teeth and Oropharynx as per pre-operative assessment

## 2019-05-16 NOTE — Progress Notes (Signed)
CRITICAL VALUE ALERT  Critical Value:  CSF WBC- 830  Date & Time Notied:  05/16/2019 0218  Provider Notified: Reinaldo Meeker Orders Received/Actions taken: No new orders received.

## 2019-05-16 NOTE — Progress Notes (Signed)
Spoke with pts daughter and updated on status. Will call pts sister on update.

## 2019-05-16 NOTE — Progress Notes (Signed)
[place] for Infectious Disease  Total days of antibiotics 2/vanco/ceftriaxone               Reason for Consult: post surgical meningitis    Referring Physician: ostergard  Active Problems:   Pituitary adenoma [facility])   CSF leak    HPI: [name] is a 63 y.o. female who has hx of pituitary adenoma resection in 2006 however in March 2020 had episode of bacterial meningitis that revealed mass compressing optic chiasm concerning for recurrence of pituitary adenoma. At that time seen by my partner, Dr Megan Salon. She underwent resection by endonasal transphenoid resection on 8/7 which was complicated by CSF leak and subsequently had lumbar drain placed x 2 on 8/11, 8/16. Due to ongoing leak, she underwent endonasal endoscopic anterior skull based reconstruction with abd fat graft on 8/21. Remained hospitalized to monitor for further csf leaks. She had been afebrile up until 2 days ago, with acute onset of N/V F - temps elevated up to 104F. IR LP revealed neutrophilic predominance pleocytosis ( CSF cell ct of WBC 830/ with 96%/protein elevated at 488/low glucose of 32/sGlu 150s) gram stain unrevealing. She was empirically started on vancomycin and ceftriaxone. She is being taken to the OR due to ongoing CSF leak. ID asked to weigh in on management. Cultures still pending  Past Medical History:  Diagnosis Date  . Anemia   . Breast cancer ([place]) 12/29/13   right 6:00 o'clock, lower outer  . Cataract of left eye   . Diabetes mellitus    type 2  . Family history of malignant neoplasm of breast   . Glaucoma    MD just watching, no eye drops  . High cholesterol   . History of blood transfusion 2015   with chemo treatments  . History of radiation therapy 08/12/14- 10/11/14   right breast /50.4 Gy/28 fx, right breast boost/ 10 Gy/ 5 fx  . HTN (hypertension)   . Personal history of chemotherapy   . Personal history of radiation therapy   . Pituitary adenoma ([place]) 10/30/2018  . Wears  dentures    full top-partial bottom  . Wears dentures    full  . Wears glasses   . Wears glasses     Allergies: No Known Allergies  MEDICATIONS: . [MAR Hold] amLODipine  10 mg Oral Daily  . [MAR Hold] atorvastatin  40 mg Oral Daily  . [MAR Hold] Chlorhexidine Gluconate Cloth  6 each Topical Daily  . [MAR Hold] docusate sodium  100 mg Oral BID  . [MAR Hold] feeding supplement (ENSURE ENLIVE)  237 mL Oral BID BM  . [MAR Hold] Fluorescein Sodium  500 mg Intravenous Once  . [MAR Hold] gabapentin  300 mg Oral BID  . [MAR Hold] Gerhardt's butt cream   Topical BID  . [MAR Hold] insulin aspart  0-20 Units Subcutaneous TID WC  . [MAR Hold] insulin aspart  0-5 Units Subcutaneous QHS  . [MAR Hold] iron polysaccharides  150 mg Oral BID  . [MAR Hold] pantoprazole  40 mg Oral Daily  . [MAR Hold] senna  1 tablet Oral BID    Social History   Tobacco Use  . Smoking status: Former Smoker    Types: Cigarettes    Quit date: 01/31/1994    Years since quitting: 25.3  . Smokeless tobacco: Never Used  Substance Use Topics  . Alcohol use: Not Currently    Comment: occasional wine  . Drug use: No  Family History  Problem Relation Age of Onset  . Hypertension Mother   . Diabetes type II Mother   . Breast cancer Mother        dx ~12; deceased 32  . Hypertension Father   . Breast cancer Maternal Aunt        deceased 32  . Cancer Maternal Uncle        unk. type; deceased 48s  . Cancer Maternal Uncle        unk. type; deceased late 53s  . Uterine cancer Cousin 66       daughter of an unaffected mat aunt who is 45    Review of Systems -   OBJECTIVE: Temp:  [100 F (37.8 C)-104.4 F (40.2 C)] 103.4 F (39.7 C) (09/07 1238) Pulse Rate:  [84-126] 107 (09/07 1200) Resp:  [9-31] 14 (09/07 1200) BP: (132-174)/(72-88) 140/81 (09/07 1200) SpO2:  [89 %-100 %] 99 % (09/07 1200) Weight:  [107.5 kg] 107.5 kg (09/07 1312) Did not examine - patient in or LABS: Results for orders placed or  performed during the hospital encounter of 04/15/19 (from the past 48 hour(s))  Glucose, capillary     Status: Abnormal   Collection Time: 05/14/19  4:49 PM  Result Value Ref Range   Glucose-Capillary 193 (H) 70 - 99 mg/dL  Glucose, capillary     Status: Abnormal   Collection Time: 05/14/19  9:30 PM  Result Value Ref Range   Glucose-Capillary 134 (H) 70 - 99 mg/dL   Comment 1 Notify RN    Comment 2 Document in Chart   Renal function panel     Status: Abnormal   Collection Time: 05/15/19  2:54 AM  Result Value Ref Range   Sodium 139 135 - 145 mmol/L   Potassium 4.1 3.5 - 5.1 mmol/L   Chloride 108 98 - 111 mmol/L   CO2 21 (L) 22 - 32 mmol/L   Glucose, Bld 142 (H) 70 - 99 mg/dL   BUN 8 8 - 23 mg/dL   Creatinine, Ser 1.24 (H) 0.44 - 1.00 mg/dL   Calcium 8.5 (L) 8.9 - 10.3 mg/dL   Phosphorus 3.8 2.5 - 4.6 mg/dL   Albumin 2.7 (L) 3.5 - 5.0 g/dL   GFR calc non Af Amer 47 (L) >60 mL/min   GFR calc Af Amer 54 (L) >60 mL/min   Anion gap 10 5 - 15    Comment: Performed at [facility], [address], [place], [place] [address]  Glucose, capillary     Status: Abnormal   Collection Time: 05/15/19  8:07 AM  Result Value Ref Range   Glucose-Capillary 165 (H) 70 - 99 mg/dL  Glucose, capillary     Status: Abnormal   Collection Time: 05/15/19 11:59 AM  Result Value Ref Range   Glucose-Capillary 145 (H) 70 - 99 mg/dL  Glucose, capillary     Status: Abnormal   Collection Time: 05/15/19  5:13 PM  Result Value Ref Range   Glucose-Capillary 148 (H) 70 - 99 mg/dL  Glucose, capillary     Status: Abnormal   Collection Time: 05/15/19  7:46 PM  Result Value Ref Range   Glucose-Capillary 209 (H) 70 - 99 mg/dL  Culture, blood (routine x 2)     Status: None (Preliminary result)   Collection Time: 05/15/19  8:17 PM   Specimen: BLOOD RIGHT ARM  Result Value Ref Range   Specimen Description BLOOD RIGHT ARM    Special Requests  BOTTLES DRAWN AEROBIC ONLY Blood Culture results may not be  optimal due to an inadequate volume of blood received in culture bottles   Culture      NO GROWTH < 12 HOURS Performed at [facility] [address], [place], [place] [address]    Report Status PENDING   Culture, blood (routine x 2)     Status: None (Preliminary result)   Collection Time: 05/15/19  8:20 PM   Specimen: BLOOD RIGHT HAND  Result Value Ref Range   Specimen Description BLOOD RIGHT HAND    Special Requests      BOTTLES DRAWN AEROBIC ONLY Blood Culture results may not be optimal due to an inadequate volume of blood received in culture bottles   Culture      NO GROWTH < 12 HOURS Performed at [facility] [address] [place], [place] [address]    Report Status PENDING   Basic metabolic panel     Status: Abnormal   Collection Time: 05/15/19  8:25 PM  Result Value Ref Range   Sodium 135 135 - 145 mmol/L   Potassium 4.0 3.5 - 5.1 mmol/L   Chloride 102 98 - 111 mmol/L   CO2 19 (L) 22 - 32 mmol/L   Glucose, Bld 224 (H) 70 - 99 mg/dL   BUN 7 (L) 8 - 23 mg/dL   Creatinine, Ser 1.46 (H) 0.44 - 1.00 mg/dL   Calcium 8.5 (L) 8.9 - 10.3 mg/dL   GFR calc non Af Amer 38 (L) >60 mL/min   GFR calc Af Amer 45 (L) >60 mL/min   Anion gap 14 5 - 15    Comment: Performed at [facility] [address] [place], [place] [address]  CSF culture     Status: None (Preliminary result)   Collection Time: 05/15/19 10:19 PM   Specimen: CSF; Cerebrospinal Fluid  Result Value Ref Range   Specimen Description CSF    Special Requests TUBE 2    Gram Stain      CYTOSPIN SMEAR WBC PRESENT,BOTH PMN AND MONONUCLEAR NO ORGANISMS SEEN    Culture      NO GROWTH < 12 HOURS Performed at [facility], [place] [address], [place], [place] [address]    Report Status PENDING   Glucose, CSF     Status: Abnormal   Collection Time: 05/15/19 10:22 PM  Result Value Ref Range   Glucose, CSF 32 (L) 40 - 70 mg/dL    Comment: Performed at [facility] [address]  [place], [place] [address]  Protein, CSF     Status: Abnormal   Collection Time: 05/15/19 10:22 PM  Result Value Ref Range   Total  Protein, CSF 488 (H) 15 - 45 mg/dL    Comment: RESULTS CONFIRMED BY MANUAL DILUTION Performed at [facility], [place] [address] [place], [place] [address]   CSF cell count with differential     Status: Abnormal   Collection Time: 05/15/19 10:22 PM  Result Value Ref Range   Tube # 3    Color, CSF COLORLESS COLORLESS   Appearance, CSF CLEAR (A) CLEAR   Supernatant COLORLESS    RBC Count, CSF 168 (H) 0 /cu mm   WBC, CSF 830 (HH) 0 - 5 /cu mm    Comment: CRITICAL RESULT CALLED TO, READ BACK BY AND VERIFIED WITH: RN H HUNT @ [phone] 05/16/19 BY S GEZAHEGN    Segmented Neutrophils-CSF 96 (H)  0 - 6 %   Lymphs, CSF 1 (L) 40 - 80 %   Monocyte-Macrophage-Spinal Fluid 2 (L) 15 - 45 %   Eosinophils, CSF 1 0 - 1 %    Comment: Performed at [facility] [address], [place], [place] [address]  Glucose, capillary     Status: Abnormal   Collection Time: 05/15/19 10:46 PM  Result Value Ref Range   Glucose-Capillary 198 (H) 70 - 99 mg/dL   Comment 1 Notify RN    Comment 2 Document in Chart   CBC     Status: Abnormal   Collection Time: 05/15/19 11:09 PM  Result Value Ref Range   WBC 10.5 4.0 - 10.5 K/uL   RBC 3.26 (L) 3.87 - 5.11 MIL/uL   Hemoglobin 8.7 (L) 12.0 - 15.0 g/dL   HCT 28.9 (L) 36.0 - 46.0 %   MCV 88.7 80.0 - 100.0 fL   MCH 26.7 26.0 - 34.0 pg   MCHC 30.1 30.0 - 36.0 g/dL   RDW 15.5 11.5 - 15.5 %   Platelets 295 150 - 400 K/uL   nRBC 0.3 (H) 0.0 - 0.2 %    Comment: Performed at [facility], [place] [address], [place], [place] [address]  Urinalysis, Routine w reflex microscopic     Status: Abnormal   Collection Time: 05/16/19 12:28 AM  Result Value Ref Range   Color, Urine STRAW (A) YELLOW   APPearance CLEAR CLEAR   Specific Gravity, Urine 1.006 1.005 - 1.030   pH 7.0 5.0 - 8.0   Glucose, UA NEGATIVE NEGATIVE mg/dL   Hgb urine dipstick  SMALL (A) NEGATIVE   Bilirubin Urine NEGATIVE NEGATIVE   Ketones, ur NEGATIVE NEGATIVE mg/dL   Protein, ur NEGATIVE NEGATIVE mg/dL   Nitrite NEGATIVE NEGATIVE   Leukocytes,Ua LARGE (A) NEGATIVE   RBC / HPF 0-5 0 - 5 RBC/hpf   WBC, UA >50 (H) 0 - 5 WBC/hpf   Bacteria, UA FEW (A) NONE SEEN    Comment: Performed at [facility] [address] [place], [place] [address]  Renal function panel     Status: Abnormal   Collection Time: 05/16/19  3:25 AM  Result Value Ref Range   Sodium 136 135 - 145 mmol/L   Potassium 4.4 3.5 - 5.1 mmol/L   Chloride 102 98 - 111 mmol/L   CO2 20 (L) 22 - 32 mmol/L   Glucose, Bld 230 (H) 70 - 99 mg/dL   BUN 6 (L) 8 - 23 mg/dL   Creatinine, Ser 1.59 (H) 0.44 - 1.00 mg/dL   Calcium 8.7 (L) 8.9 - 10.3 mg/dL   Phosphorus 3.5 2.5 - 4.6 mg/dL   Albumin 3.0 (L) 3.5 - 5.0 g/dL   GFR calc non Af Amer 35 (L) >60 mL/min   GFR calc Af Amer 40 (L) >60 mL/min   Anion gap 14 5 - 15    Comment: Performed at [facility] [address], [place], [place] [address]  Glucose, capillary     Status: Abnormal   Collection Time: 05/16/19  8:00 AM  Result Value Ref Range   Glucose-Capillary 179 (H) 70 - 99 mg/dL  Glucose, capillary     Status: Abnormal   Collection Time: 05/16/19 12:27 PM  Result Value Ref Range   Glucose-Capillary 188 (H) 70 - 99 mg/dL    MICRO:  IMAGING: Dg Chest 1 View  Result Date: 05/15/2019 CLINICAL DATA:  63 year old female with fever. EXAM:  CHEST  1 VIEW COMPARISON:  Chest radiograph dated 10/28/2018 FINDINGS: Shallow inspiration with minimal bibasilar atelectasis. No focal consolidation, pleural effusion, or pneumothorax. There is mild cardiomegaly. Atherosclerotic calcification of the aortic arch. No acute osseous pathology. IMPRESSION: No active disease. Electronically Signed   By: Anner Crete M.D.   On: 05/15/2019 21:38   Dg Fluoro Guide Lumbar Puncture  Result Date: 05/15/2019 CLINICAL DATA:  Fever EXAM: DIAGNOSTIC LUMBAR  PUNCTURE UNDER FLUOROSCOPIC GUIDANCE FLUOROSCOPY TIME:  Fluoroscopy Time:  30 seconds Radiation Exposure Index (if provided by the fluoroscopic device): Number of Acquired Spot Images: 0 PROCEDURE: Informed consent was obtained from the patient prior to the procedure, including potential complications of headache, allergy, and pain. With the patient prone, the lower back was prepped with Betadine. 1% Lidocaine was used for local anesthesia. Lumbar puncture was performed at the L3-4 level using a 20 gauge needle with return of cloudy CSF with an opening pressure of 17 cm water. Five ml of CSF were obtained for laboratory studies. The patient tolerated the procedure well and there were no apparent complications. IMPRESSION: Technically successful lumbar puncture under fluoroscopic guidance. CSF was cloudy. Electronically Signed   By: Rolm Baptise M.D.   On: 05/15/2019 23:42    Assessment/Plan:  Probable bacterial meningitis -associated with post instrumentation from trasphenoid resection /anterior skull base reconstruction from ongoing CSF leak. Likely pathogens include commensal flora oral-nasal passages but also could have other hospital associated pathogens such as pseudomonas.   for now, recommend to change abtx to vancomycin plus cefepime Will follow up on culture, but please send more specimen from OR To do formal consult when she is back in ICU.  Elzie Rings [facility] for Infectious Diseases [phone]

## 2019-05-16 NOTE — Transfer of Care (Signed)
Immediate Anesthesia Transfer of Care Note  Patient: [name]  Procedure(s) Performed: PLACEMENT OF LUMBAR DRAIN (N/A )  Patient Location: PACU  Anesthesia Type:General  Level of Consciousness: drowsy and patient cooperative  Airway & Oxygen Therapy: Patient Spontanous Breathing and Patient connected to nasal cannula oxygen  Post-op Assessment: Report given to RN, Post -op Vital signs reviewed and stable and Patient moving all extremities X 4  Post vital signs: Reviewed and stable  Last Vitals:  Vitals Value Taken Time  BP 146/86 05/16/19 1634  Temp    Pulse    Resp 20 05/16/19 1639  SpO2    Vitals shown include unvalidated device data.  Last Pain:  Vitals:   05/16/19 1238  TempSrc: Rectal  PainSc:       Patients Stated Pain Goal: 4 ([id number])  Complications: No apparent anesthesia complications

## 2019-05-16 NOTE — Progress Notes (Signed)
Pt complaining of drainage out of nose and also down the back of her throat. Upon assessment, both sides of pts pillow case and R side of sheet saturated with clear drainage from nose. On-call notified of drainage. Verbal order for NPO as that pt will be going to OR.   Updated pts sister, who stated she will call daughter.

## 2019-05-17 ENCOUNTER — Encounter (HOSPITAL_COMMUNITY): Payer: Self-pay | Admitting: Neurosurgery

## 2019-05-17 DIAGNOSIS — D352 Benign neoplasm of pituitary gland: Principal | ICD-10-CM

## 2019-05-17 DIAGNOSIS — G009 Bacterial meningitis, unspecified: Secondary | ICD-10-CM

## 2019-05-17 DIAGNOSIS — Z978 Presence of other specified devices: Secondary | ICD-10-CM

## 2019-05-17 DIAGNOSIS — Z87891 Personal history of nicotine dependence: Secondary | ICD-10-CM

## 2019-05-17 DIAGNOSIS — Z8661 Personal history of infections of the central nervous system: Secondary | ICD-10-CM

## 2019-05-17 LAB — RENAL FUNCTION PANEL
Albumin: 2.9 g/dL — ABNORMAL LOW (ref 3.5–5.0)
Albumin: 2.7 g/dL — ABNORMAL LOW (ref 3.5–5.0)
Anion gap: 10 (ref 5–15)
Anion gap: 11 (ref 5–15)
BUN: 8 mg/dL (ref 8–23)
BUN: 10 mg/dL (ref 8–23)
CO2: 23 mmol/L (ref 22–32)
CO2: 21 mmol/L — ABNORMAL LOW (ref 22–32)
Calcium: 8.6 mg/dL — ABNORMAL LOW (ref 8.9–10.3)
Calcium: 8.4 mg/dL — ABNORMAL LOW (ref 8.9–10.3)
Chloride: 106 mmol/L (ref 98–111)
Chloride: 107 mmol/L (ref 98–111)
Creatinine, Ser: 1.54 mg/dL — ABNORMAL HIGH (ref 0.44–1.00)
Creatinine, Ser: 1.69 mg/dL — ABNORMAL HIGH (ref 0.44–1.00)
GFR calc Af Amer: 42 mL/min — ABNORMAL LOW (ref >60)
GFR calc Af Amer: 37 mL/min — ABNORMAL LOW (ref >60)
GFR calc non Af Amer: 36 mL/min — ABNORMAL LOW (ref >60)
GFR calc non Af Amer: 32 mL/min — ABNORMAL LOW (ref >60)
Glucose, Bld: 305 mg/dL — ABNORMAL HIGH (ref 70–99)
Glucose, Bld: 207 mg/dL — ABNORMAL HIGH (ref 70–99)
Phosphorus: 3.2 mg/dL (ref 2.5–4.6)
Phosphorus: 3.1 mg/dL (ref 2.5–4.6)
Potassium: 4.3 mmol/L (ref 3.5–5.1)
Potassium: 4.2 mmol/L (ref 3.5–5.1)
Sodium: 139 mmol/L (ref 135–145)
Sodium: 139 mmol/L (ref 135–145)

## 2019-05-17 LAB — GLUCOSE, CAPILLARY
Glucose-Capillary: 124 mg/dL — ABNORMAL HIGH (ref 70–99)
Glucose-Capillary: 133 mg/dL — ABNORMAL HIGH (ref 70–99)
Glucose-Capillary: 171 mg/dL — ABNORMAL HIGH (ref 70–99)
Glucose-Capillary: 213 mg/dL — ABNORMAL HIGH (ref 70–99)
Glucose-Capillary: 228 mg/dL — ABNORMAL HIGH (ref 70–99)
Glucose-Capillary: 278 mg/dL — ABNORMAL HIGH (ref 70–99)

## 2019-05-17 LAB — URINE CULTURE: Culture: >=100000 — AB

## 2019-05-17 LAB — PATHOLOGIST SMEAR REVIEW

## 2019-05-17 LAB — OSMOLALITY: Osmolality: 304 mOsm/kg — ABNORMAL HIGH (ref 275–295)

## 2019-05-17 NOTE — Progress Notes (Signed)
Neurosurgery Service Progress Note  Subjective: No acute events overnight, no drainage overnight,   Objective: Vitals:   05/17/19 1100 05/17/19 1200 05/17/19 1300 05/17/19 1400  BP: 129/70 137/80 126/72 121/73  Pulse: (!) 105 (!) 115 (!) 110 (!) 107  Resp: 18 17 (!) 21 18  Temp:      TempSrc:      SpO2: 100% 100% 99% 100%  Weight:      Height:       Temp (24hrs), Avg:100.5 F (38.1 C), Min:98.7 F (37.1 C), Max:103.1 F (39.5 C)  CBC Latest Ref Rng & Units 05/15/2019 05/06/2019 05/04/2019  WBC 4.0 - 10.5 K/uL 10.5 9.6 7.5  Hemoglobin 12.0 - 15.0 g/dL 8.7(L) 7.3(L) 8.7(L)  Hematocrit 36.0 - 46.0 % 28.9(L) 23.1(L) 29.0(L)  Platelets 150 - 400 K/uL 295 197 263   BMP Latest Ref Rng & Units 05/17/2019 05/17/2019 05/16/2019  Glucose 70 - 99 mg/dL 207(H) 305(H) 230(H)  BUN 8 - 23 mg/dL 10 8 6(L)  Creatinine 0.44 - 1.00 mg/dL 1.69(H) 1.54(H) 1.59(H)  Sodium 135 - 145 mmol/L 139 139 136  Potassium 3.5 - 5.1 mmol/L 4.2 4.3 4.4  Chloride 98 - 111 mmol/L 107 106 102  CO2 22 - 32 mmol/L 21(L) 23 20(L)  Calcium 8.9 - 10.3 mg/dL 8.4(L) 8.6(L) 8.7(L)    Intake/Output Summary (Last 24 hours) at 05/17/2019 1425 Last data filed at 05/17/2019 1400 Gross per 24 hour  Intake 4074.43 ml  Output 4280 ml  Net -205.57 ml    Current Facility-Administered Medications:  .  0.9 %  sodium chloride infusion, , Intravenous, PRN, Newman Pies, MD, Last Rate: 10 mL/hr at 05/17/19 1400 .  0.9 %  sodium chloride infusion, , Intravenous, Continuous, Newman Pies, MD, Last Rate: 75 mL/hr at 05/17/19 1400 .  acetaminophen (TYLENOL) tablet 650 mg, 650 mg, Oral, Q4H PRN, 650 mg at 05/17/19 1245 **OR** acetaminophen (TYLENOL) suppository 650 mg, 650 mg, Rectal, Q4H PRN, Newman Pies, MD, 650 mg at 05/16/19 0746 .  alum & mag hydroxide-simeth (MAALOX/MYLANTA) 200-200-20 MG/5ML suspension 30 mL, 30 mL, Oral, Q6H PRN, Newman Pies, MD, 30 mL at 05/05/19 1523 .  amLODipine (NORVASC) tablet 10 mg, 10 mg, Oral,  Daily, Newman Pies, MD, 10 mg at 05/17/19 1003 .  atorvastatin (LIPITOR) tablet 40 mg, 40 mg, Oral, Daily, Newman Pies, MD, 40 mg at 05/14/19 1720 .  ceFEPIme (MAXIPIME) 2 g in sodium chloride 0.9 % 100 mL IVPB, 2 g, Intravenous, Q12H, Bergman, Meghan D, NP, Stopped at 05/17/19 [id number] .  Chlorhexidine Gluconate Cloth 2 % PADS 6 each, 6 each, Topical, Daily, Newman Pies, MD, 6 each at 05/16/19 [id number] .  docusate sodium (COLACE) capsule 100 mg, 100 mg, Oral, BID, Newman Pies, MD, 100 mg at 05/17/19 1004 .  feeding supplement (ENSURE ENLIVE) (ENSURE ENLIVE) liquid 237 mL, 237 mL, Oral, BID BM, Newman Pies, MD, 237 mL at 05/17/19 1011 .  Fluorescein Sodium 10 % injection 500 mg, 500 mg, Intravenous, Once, Newman Pies, MD .  gabapentin (NEURONTIN) capsule 300 mg, 300 mg, Oral, BID, Newman Pies, MD, 300 mg at 05/17/19 1003 .  Gerhardt's butt cream, , Topical, BID, Newman Pies, MD, 1 application at 0000000 1003 .  HYDROcodone-acetaminophen (NORCO/VICODIN) 5-325 MG per tablet 1 tablet, 1 tablet, Oral, Q4H PRN, Newman Pies, MD, 1 tablet at 05/15/19 1545 .  HYDROmorphone (DILAUDID) injection 0.5 mg, 0.5 mg, Intravenous, Q3H PRN, Newman Pies, MD, 0.5 mg at 04/26/19 0939 .  insulin aspart (novoLOG) injection 0-20  Units, 0-20 Units, Subcutaneous, TID WC, Newman Pies, MD, 7 Units at 05/17/19 1244 .  insulin aspart (novoLOG) injection 0-5 Units, 0-5 Units, Subcutaneous, QHS, Newman Pies, MD, 2 Units at 05/16/19 2126 .  iron polysaccharides (NIFEREX) capsule 150 mg, 150 mg, Oral, BID, Newman Pies, MD, 150 mg at 05/17/19 1009 .  labetalol (NORMODYNE) injection 10-40 mg, 10-40 mg, Intravenous, Q10 min PRN, Newman Pies, MD, 20 mg at 05/16/19 2307 .  lidocaine (PF) (XYLOCAINE) 1 % injection, , , PRN, Jacqulynn Cadet, MD, 10 mL at 04/19/19 1436 .  ondansetron (ZOFRAN) tablet 4 mg, 4 mg, Oral, Q4H PRN, 4 mg at 04/30/19 1043 **OR** ondansetron (ZOFRAN)  injection 4 mg, 4 mg, Intravenous, Q4H PRN, Newman Pies, MD, 4 mg at 05/16/19 0910 .  ondansetron (ZOFRAN) tablet 4 mg, 4 mg, Oral, Q6H PRN **OR** ondansetron (ZOFRAN) injection 4 mg, 4 mg, Intravenous, Q6H PRN, Newman Pies, MD, 4 mg at 05/05/19 0941 .  pantoprazole (PROTONIX) EC tablet 40 mg, 40 mg, Oral, Daily, Newman Pies, MD, 40 mg at 05/17/19 1004 .  polyethylene glycol (MIRALAX / GLYCOLAX) packet 17 g, 17 g, Oral, Daily PRN, Newman Pies, MD .  promethazine [facility]) injection 12.5-25 mg, 12.5-25 mg, Intravenous, Q4H PRN, Newman Pies, MD, 25 mg at 05/15/19 2239 .  promethazine (PHENERGAN) tablet 12.5-25 mg, 12.5-25 mg, Oral, Q4H PRN, Newman Pies, MD .  senna [facility]) tablet 8.6 mg, 1 tablet, Oral, BID, Newman Pies, MD, 8.6 mg at 05/17/19 1004 .  sodium chloride (OCEAN) 0.65 % nasal spray 4 spray, 4 spray, Each Nare, PRN, Newman Pies, MD .  tiZANidine (ZANAFLEX) tablet 4 mg, 4 mg, Oral, Q8H PRN, Newman Pies, MD, 4 mg at 04/20/19 1554 .  vancomycin (VANCOCIN) IVPB 1000 mg/200 mL premix, 1,000 mg, Intravenous, Q12H, Newman Pies, MD, Stopped at 05/17/19 1102   Physical Exam: Awake, Ox3, PERRL, EOMI, FS, Strength 5/5 x4, SILTx4, VFF with mild bitemporal hemianopsia No rhinorrhea on exam this morning Abdominal incisions c/d/i  Assessment & Plan: 63 y.o. 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 Failed bedside lumbar drain placement. 8/11 LD placed under fluoro, minimal output, 8/14 LD removed; 8/16 rhinorrhea, LD replaced, 8/19 somnolence, CTH w/ large volume pneumocephalus, LD clamped, 8/21 s/p repeat endonasal skull base reconstruction, 8/25 rpt CTH with resolving pneumocephalus, febrile started on vanc/cefepime  -f/u CSF Cx, NGTD -continue LD @ 15cc/hr -Abx per  ID -cont qd RFPs -continue holding lisinopril and glyburide -SCDs/TEDs/SQH  Addelynn Batte A Ninetta Adelstein  05/17/19 2:25 PM

## 2019-05-17 NOTE — Progress Notes (Signed)
Nutrition Follow-up  DOCUMENTATION CODES:   Obesity unspecified  INTERVENTION:  -Continue Ensure Enlive po BID, each supplement provides 350 kcal and 20 grams of protein -MVI with minerals daily -Magic cup TID with meals, each supplement provides 290 kcal and 9 grams of protein   NUTRITION DIAGNOSIS:   Inadequate oral intake related to decreased appetite as evidenced by meal completion < 50%.  Progressing GOAL:   Patient will meet greater than or equal to 90% of their needs  Progressing   MONITOR:   PO intake, Supplement acceptance  REASON FOR ASSESSMENT:   Rounds    ASSESSMENT:  RD working remotely.  Pt with PMH of DM, HTN, pituitary adenomawith bacterial meningitis earlier this year now admitted for repeat endoscopic endonasal resection of recurrent pituitary adenoma.  Pt with CSF leak s/p multi-layered repair but developed post-op CSF rhinorrhea.  8/29 - lumbar drain removed 9/7 - lumbar drain placement  Per chart review, Neuro reports no rhinorrhea on exam this morning. Diet advanced to [facility] s/p procedure; no documented meals at this time. Noted 15-60% meal intake 9/2-9/6  Current wt 107.5 kg    Admit wt 106.3 kg  UOP: 4050 ml x 24 hrs I/O's: -9459 ml  since 8/25;  -821 ml  x 24 hrs  Medications reviewed and include: gabapentin, colace,senokot, SSI novolog, niferex, protonix, maxipime, vancomycin  Labs: CBGS 171-228 Ca corrects for low albumin 9.4  Diet Order:   Diet Order            Diet full liquid Room service appropriate? Yes; Fluid consistency: Thin  Diet effective now              EDUCATION NEEDS:   Education needs have been addressed  Skin:  Skin Assessment: Skin Integrity Issues: Skin Integrity Issues:: Incisions, Other (Comment) Incisions: closed back, abdomen, and nose Other: non pressure wounds to rt and lt labia  Last BM:  05/09/19  Height:   Ht Readings from Last 1 Encounters:  05/16/19 5\' 6"  (1.676 m)    Weight:   Wt  Readings from Last 1 Encounters:  05/16/19 107.5 kg    Ideal Body Weight:  59 kg  BMI:  Body mass index is 38.25 kg/m.  Estimated Nutritional Needs:   Kcal:  2000-2200  Protein:  100-120 grams  Fluid:  > 2 L/day   Lajuan Lines, RD, LDN Telephone [phone] After Hours/Weekend Pager: [phone]

## 2019-05-17 NOTE — Progress Notes (Signed)
Inpatient Diabetes Program Recommendations  AACE/ADA: New Consensus Statement on Inpatient Glycemic Control (2015)  Target Ranges:  Prepandial:   less than 140 mg/dL      Peak postprandial:   less than 180 mg/dL (1-2 hours)      Critically ill patients:  140 - 180 mg/dL   Lab Results  Component Value Date   GLUCAP 228 (H) 05/17/2019    Review of Glycemic Control Results for [name], [name] (MRN [id number]) as of 05/17/2019 13:11  Ref. Range 05/16/2019 08:00 05/16/2019 12:27 05/16/2019 16:39 05/16/2019 20:15 05/17/2019 00:13 05/17/2019 04:06 05/17/2019 08:24 05/17/2019 11:46  Glucose-Capillary Latest Ref Range: 70 - 99 mg/dL 179 (H) 188 (H) 170 (H) 233 (H) 278 (H) 213 (H) 171 (H) 228 (H)   Diabetes history: DM 2 Outpatient Diabetes medications: Glyburide 5 mg bid, Metformin 1000 mg Daily Current orders for Inpatient glycemic control:  Novolog 0-20 units tid Novolog 0-5 units qhs  Inpatient Diabetes Program Recommendations:    Glucose trends increased, however, patient received Decadron 4 mg yesterday. Glucose trends should decrease within the next 24-48 hours. Glucose trends controlled previously on current regimen without decadron on board. Follow trends.  Thanks,  Tama Headings RN, MSN, BC-ADM Inpatient Diabetes Coordinator Team Pager [phone] (8a-5p)

## 2019-05-17 NOTE — Consult Note (Signed)
[facility] Infect[facility]    Date of Admission:  04/15/2019     Total days of antibiotics 3  Vancomycin 3  Cefepime 2             Reason for Consult: bacterial meningitis    Referring Provider: [place] Primary Care Provider: Chesley Noon, MD    Assessment: [name] is a 63 y.o. female with history of recent revision to pituitary adenoma that was first resected in 2006. She has struggled with ongoing CSF leaks and now on 3rd lumbar drain. On the 6th of September she began spiking erratic high fevers; Vancomycin + Ceftriaxone were given and she underwent another drain with CSF assessment revealing purulent meningitis (WBC 850 >90% neuts, protein > 400 and Glucose < 40). Currently no organisms on stain or plate (of note it appears she may have been dosed with IV antibiotics prior to CSF collection which could impede result and cause false negative). Would continue current antibiotics of vancomycin + cefepime for now. Follow with neurosurgery recommendations to repeat CSF analysis to follow WBC counts and cultures. Clinically she looks pretty good aside from her ongoing fever pattern.   She and her daughter are asking about a shunt placement - I don't see this to be scheduled at this point but timing will need further discussion.   Will continue to follow.    Plan: 1. Continue Vancomycin and Cefepime 2. Follow micro data    Principal Problem:   Acute bacterial meningitis Active Problems:   Diabetes mellitus type II, non insulin dependent (HCC)   AKI (acute kidney injury) ([facility])   Pituitary adenoma (HCC)   CSF leak   . amLODipine  10 mg Oral Daily  . atorvastatin  40 mg Oral Daily  . Chlorhexidine Gluconate Cloth  6 each Topical Daily  . docusate sodium  100 mg Oral BID  . feeding supplement (ENSURE ENLIVE)  237 mL Oral BID BM  . Fluorescein Sodium  500 mg Intravenous Once  . gabapentin  300 mg Oral BID  . Gerhardt's butt cream   Topical BID   . insulin aspart  0-20 Units Subcutaneous TID WC  . insulin aspart  0-5 Units Subcutaneous QHS  . iron polysaccharides  150 mg Oral BID  . pantoprazole  40 mg Oral Daily  . senna  1 tablet Oral BID    HPI: [name] is a 63 y.o. female s/p repeat endoscopic endonasal resection of recurrent pituitary adenoma with bacterial meningitis earlier this year, expected intra-op large CSF leak s/p multi-layered repair, but developed post-op CSF rhinorrhea and clinical course as summarized below.  8/11 lumbar drain placed under fluoro, minimal output >> removed 8/14 8/16 rhinorrhea >> lumbar drain replaced 8/19 noted to have somnolence, head CT with large volume pneumocephalus   8/21 s/p repeat endonasal skull base reconstruction 8/24 episode of rhinorrhea, lumbar drain increased to 15cc/hr 8/25 Resolving pneumocephalus 8/28 drain clamped >> 8/29 no evidence of CSF rhinorrhea when leaned forward >> Lumbar drain removed.  8/30 minimal nasal drainage 9/4 follow-up CT w/o leak  9/07 lumbar drain placed   Developed high erratic fevers on 9/06 and started on vancomycin and ceftriaxone with CSF analysis:  LP 05/15/19 - WBC 830 (96% neutrophils), Gluc 32, Protein 488, gram stain negative and no growth at 2 days.   She always has headaches but does not report that they were significantly bad. She has no vision changes, headaches,  nausea or weakness.   Review of Systems: Review of Systems  Constitutional: Positive for chills and fever. Negative for malaise/fatigue.  HENT: Negative for congestion and sinus pain.   Eyes: Negative for blurred vision, photophobia and pain.  Respiratory: Negative for cough.   Cardiovascular: Negative for chest pain.  Gastrointestinal: Negative for abdominal pain, diarrhea and vomiting.  Genitourinary: Negative for dysuria.  Musculoskeletal: Positive for back pain. Negative for joint pain and myalgias.  Skin: Negative for rash.  Neurological: Positive for  headaches. Negative for tingling and weakness.    Past Medical History:  Diagnosis Date  . Anemia   . Breast cancer ([facility]) 12/29/13   right 6:00 o'clock, lower outer  . Cataract of left eye   . Diabetes mellitus    type 2  . Family history of malignant neoplasm of breast   . Glaucoma    MD just watching, no eye drops  . High cholesterol   . History of blood transfusion 2015   with chemo treatments  . History of radiation therapy 08/12/14- 10/11/14   right breast /50.4 Gy/28 fx, right breast boost/ 10 Gy/ 5 fx  . HTN (hypertension)   . Personal history of chemotherapy   . Personal history of radiation therapy   . Pituitary adenoma ([facility]) 10/30/2018  . Wears dentures    full top-partial bottom  . Wears dentures    full  . Wears glasses   . Wears glasses     Social History   Tobacco Use  . Smoking status: Former Smoker    Types: Cigarettes    Quit date: 01/31/1994    Years since quitting: 25.3  . Smokeless tobacco: Never Used  Substance Use Topics  . Alcohol use: Not Currently    Comment: occasional wine  . Drug use: No    Family History  Problem Relation Age of Onset  . Hypertension Mother   . Diabetes type II Mother   . Breast cancer Mother        dx ~38; deceased 78  . Hypertension Father   . Breast cancer Maternal Aunt        deceased 86  . Cancer Maternal Uncle        unk. type; deceased 54s  . Cancer Maternal Uncle        unk. type; deceased late 77s  . Uterine cancer Cousin 37       daughter of an unaffected mat aunt who is 80   No Known Allergies  OBJECTIVE: Blood pressure 121/73, pulse (!) 107, temperature 99.2 F (37.3 C), temperature source Oral, resp. rate 18, height 5\' 6"  (1.676 m), weight 107.5 kg, SpO2 100 %.  Physical Exam Constitutional:      Appearance: She is not ill-appearing.     Comments: Resting quietly in bed   HENT:     Mouth/Throat:     Mouth: Mucous membranes are moist.     Pharynx: Oropharynx is clear.  Eyes:     Pupils:  Pupils are equal, round, and reactive to light.  Neck:     Musculoskeletal: Normal range of motion. No neck rigidity.  Cardiovascular:     Rate and Rhythm: Normal rate and regular rhythm.     Pulses: Normal pulses.     Heart sounds: No murmur.  Pulmonary:     Effort: Pulmonary effort is normal.     Breath sounds: Normal breath sounds.  Abdominal:     General: Bowel sounds are normal. There is no distension.  Musculoskeletal: Normal range of motion.        General: No swelling.     Comments: Lumbar drain clean/dry dressing  Lymphadenopathy:     Cervical: No cervical adenopathy.  Skin:    General: Skin is warm and dry.     Capillary Refill: Capillary refill takes less than 2 seconds.  Neurological:     Mental Status: She is alert and oriented to person, place, and time.     Lab Results Lab Results  Component Value Date   WBC 10.5 05/15/2019   HGB 8.7 (L) 05/15/2019   HCT 28.9 (L) 05/15/2019   MCV 88.7 05/15/2019   PLT 295 05/15/2019    Lab Results  Component Value Date   CREATININE 1.69 (H) 05/17/2019   BUN 10 05/17/2019   NA 139 05/17/2019   K 4.2 05/17/2019   CL 107 05/17/2019   CO2 21 (L) 05/17/2019    Lab Results  Component Value Date   ALT 25 10/28/2018   AST 41 10/28/2018   ALKPHOS 88 10/28/2018   BILITOT 0.7 10/28/2018     Microbiology: Recent Results (from the past 240 hour(s))  Culture, blood (routine x 2)     Status: None (Preliminary result)   Collection Time: 05/15/19  8:17 PM   Specimen: BLOOD RIGHT ARM  Result Value Ref Range Status   Specimen Description BLOOD RIGHT ARM  Final   Special Requests   Final    BOTTLES DRAWN AEROBIC ONLY Blood Culture results may not be optimal due to an inadequate volume of blood received in culture bottles   Culture   Final    NO GROWTH 2 DAYS Performed at [facility], [place] [address] [place], [place] [address]    Report Status PENDING  Incomplete  Culture, blood (routine x 2)     Status: None  (Preliminary result)   Collection Time: 05/15/19  8:20 PM   Specimen: BLOOD RIGHT HAND  Result Value Ref Range Status   Specimen Description BLOOD RIGHT HAND  Final   Special Requests   Final    BOTTLES DRAWN AEROBIC ONLY Blood Culture results may not be optimal due to an inadequate volume of blood received in culture bottles   Culture   Final    NO GROWTH 2 DAYS Performed at [facility], [place] [address], [place], [place] [address]    Report Status PENDING  Incomplete  CSF culture     Status: None (Preliminary result)   Collection Time: 05/15/19 10:19 PM   Specimen: CSF; Cerebrospinal Fluid  Result Value Ref Range Status   Specimen Description CSF  Final   Special Requests TUBE 2  Final   Gram Stain   Final    CYTOSPIN SMEAR WBC PRESENT,BOTH PMN AND MONONUCLEAR NO ORGANISMS SEEN    Culture   Final    NO GROWTH 2 DAYS Performed at [facility], [place] [address] [place], [place] [address]    Report Status PENDING  Incomplete  Culture, Urine     Status: Abnormal   Collection Time: 05/16/19 12:28 AM   Specimen: Urine, Random  Result Value Ref Range Status   Specimen Description URINE, RANDOM  Final   Special Requests   Final    NONE Performed at [facility], [place] [address], [place],  [address]    Culture >=100,000 COLONIES/mL ENTEROBACTER AEROGENES (A)  Final   Report Status 05/17/2019 FINAL  Final   Organism ID, Bacteria ENTEROBACTER  AEROGENES (A)  Final      Susceptibility   Enterobacter aerogenes - MIC*    CEFAZOLIN >=64 RESISTANT Resistant     CEFTRIAXONE <=1 SENSITIVE Sensitive     CIPROFLOXACIN <=0.25 SENSITIVE Sensitive     GENTAMICIN <=1 SENSITIVE Sensitive     IMIPENEM 2 SENSITIVE Sensitive     NITROFURANTOIN 128 RESISTANT Resistant     TRIMETH/SULFA <=20 SENSITIVE Sensitive     PIP/TAZO <=4 SENSITIVE Sensitive     * >=100,000 COLONIES/mL ENTEROBACTER AEROGENES    Janene Madeira, MSN, NP-C [facility] [email or web address] Pager: [phone] Office: [phone] [place]: [phone]  05/17/2019 4:42 PM

## 2019-05-18 DIAGNOSIS — H5462 Unqualified visual loss, left eye, normal vision right eye: Secondary | ICD-10-CM

## 2019-05-18 LAB — RENAL FUNCTION PANEL
Albumin: 2.8 g/dL — ABNORMAL LOW (ref 3.5–5.0)
Anion gap: 8 (ref 5–15)
BUN: 11 mg/dL (ref 8–23)
CO2: 23 mmol/L (ref 22–32)
Calcium: 8.6 mg/dL — ABNORMAL LOW (ref 8.9–10.3)
Chloride: 106 mmol/L (ref 98–111)
Creatinine, Ser: 1.82 mg/dL — ABNORMAL HIGH (ref 0.44–1.00)
GFR calc Af Amer: 34 mL/min — ABNORMAL LOW (ref >60)
GFR calc non Af Amer: 29 mL/min — ABNORMAL LOW (ref >60)
Glucose, Bld: 162 mg/dL — ABNORMAL HIGH (ref 70–99)
Phosphorus: 3.1 mg/dL (ref 2.5–4.6)
Potassium: 4.1 mmol/L (ref 3.5–5.1)
Sodium: 137 mmol/L (ref 135–145)

## 2019-05-18 LAB — CBC
HCT: 24.8 % — ABNORMAL LOW (ref 36.0–46.0)
Hemoglobin: 7.5 g/dL — ABNORMAL LOW (ref 12.0–15.0)
MCH: 26.7 pg (ref 26.0–34.0)
MCHC: 30.2 g/dL (ref 30.0–36.0)
MCV: 88.3 fL (ref 80.0–100.0)
Platelets: 298 10*3/uL (ref 150–400)
RBC: 2.81 MIL/uL — ABNORMAL LOW (ref 3.87–5.11)
RDW: 15.5 % (ref 11.5–15.5)
WBC: 10.7 10*3/uL — ABNORMAL HIGH (ref 4.0–10.5)
nRBC: 0 % (ref 0.0–0.2)

## 2019-05-18 LAB — CSF CELL COUNT WITH DIFFERENTIAL
Lymphs, CSF: 3 % — ABNORMAL LOW (ref 40–80)
Monocyte-Macrophage-Spinal Fluid: 5 % — ABNORMAL LOW (ref 15–45)
RBC Count, CSF: 370 /mm3 — ABNORMAL HIGH
Segmented Neutrophils-CSF: 92 % — ABNORMAL HIGH (ref 0–6)
WBC, CSF: 2040 /mm3 (ref 0–5)

## 2019-05-18 LAB — GLUCOSE, CAPILLARY
Glucose-Capillary: 125 mg/dL — ABNORMAL HIGH (ref 70–99)
Glucose-Capillary: 137 mg/dL — ABNORMAL HIGH (ref 70–99)
Glucose-Capillary: 147 mg/dL — ABNORMAL HIGH (ref 70–99)
Glucose-Capillary: 148 mg/dL — ABNORMAL HIGH (ref 70–99)
Glucose-Capillary: 154 mg/dL — ABNORMAL HIGH (ref 70–99)
Glucose-Capillary: 163 mg/dL — ABNORMAL HIGH (ref 70–99)
Glucose-Capillary: 174 mg/dL — ABNORMAL HIGH (ref 70–99)

## 2019-05-18 LAB — CBC WITH DIFFERENTIAL/PLATELET
Abs Immature Granulocytes: 0.05 10*3/uL (ref 0.00–0.07)
Basophils Absolute: 0 10*3/uL (ref 0.0–0.1)
Basophils Relative: 0 %
Eosinophils Absolute: 0.1 10*3/uL (ref 0.0–0.5)
Eosinophils Relative: 1 %
HCT: 22.9 % — ABNORMAL LOW (ref 36.0–46.0)
Hemoglobin: 6.9 g/dL — CL (ref 12.0–15.0)
Immature Granulocytes: 0 %
Lymphocytes Relative: 24 %
Lymphs Abs: 2.7 10*3/uL (ref 0.7–4.0)
MCH: 26.3 pg (ref 26.0–34.0)
MCHC: 30.1 g/dL (ref 30.0–36.0)
MCV: 87.4 fL (ref 80.0–100.0)
Monocytes Absolute: 0.6 10*3/uL (ref 0.1–1.0)
Monocytes Relative: 5 %
Neutro Abs: 7.9 10*3/uL — ABNORMAL HIGH (ref 1.7–7.7)
Neutrophils Relative %: 70 %
Platelets: 294 10*3/uL (ref 150–400)
RBC: 2.62 MIL/uL — ABNORMAL LOW (ref 3.87–5.11)
RDW: 15.9 % — ABNORMAL HIGH (ref 11.5–15.5)
WBC: 11.3 10*3/uL — ABNORMAL HIGH (ref 4.0–10.5)
nRBC: 0 % (ref 0.0–0.2)

## 2019-05-18 LAB — PROTEIN AND GLUCOSE, CSF
Glucose, CSF: <20 mg/dL — CL (ref 40–70)
Total  Protein, CSF: 98 mg/dL — ABNORMAL HIGH (ref 15–45)

## 2019-05-18 MED ORDER — VANCOMYCIN HCL 10 G IV SOLR
1250.0000 mg | INTRAVENOUS | Status: DC
  Administered 2019-05-19: 1250 mg via INTRAVENOUS
  Filled 2019-05-18 (×2): qty 1250

## 2019-05-18 MED ORDER — HEPARIN SODIUM (PORCINE) 5000 UNIT/ML IJ SOLN
5000.0000 [IU] | Freq: Three times a day (TID) | INTRAMUSCULAR | Status: DC
  Administered 2019-05-18 – 2019-05-30 (×37): 5000 [IU] via SUBCUTANEOUS
  Filled 2019-05-18 (×36): qty 1

## 2019-05-18 MED ORDER — LEVOFLOXACIN IN D5W 750 MG/150ML IV SOLN
750.0000 mg | INTRAVENOUS | Status: AC
  Administered 2019-05-20 – 2019-05-26 (×4): 750 mg via INTRAVENOUS
  Filled 2019-05-18 (×4): qty 150

## 2019-05-18 MED ORDER — WHITE PETROLATUM EX OINT
TOPICAL_OINTMENT | CUTANEOUS | Status: AC
  Filled 2019-05-18: qty 28.35

## 2019-05-18 MED ORDER — LEVOFLOXACIN IN D5W 750 MG/150ML IV SOLN
750.0000 mg | INTRAVENOUS | Status: DC
  Administered 2019-05-18: 750 mg via INTRAVENOUS
  Filled 2019-05-18: qty 150

## 2019-05-18 NOTE — Progress Notes (Signed)
Pharmacy Antibiotic Note  [name] is a 63 y.o. female admitted on 04/15/2019 status post second CSF leak revision that included lumbar drain placement and endonasal repair and patch.  Pharmacy has been consulted for vancomycin dosing empirically for meningitis. Cultures remain negative at this time   Plan: Continue ceftriaxone to 2gm IV q12h Change vancomycin to 1250mg  IV q36h as her Scr continues to worrsen Monitor for clinical course, LOT, and Scr trend   Height: 5\' 6"  (167.6 cm) Weight: 236 lb 15.9 oz (107.5 kg) IBW/kg (Calculated) : 59.3  Temp (24hrs), Avg:101 F (38.3 C), Min:100.1 F (37.8 C), Max:102.5 F (39.2 C)  Recent Labs  Lab 05/15/19 2025 05/15/19 2309 05/16/19 0325 05/17/19 0056 05/17/19 0802 05/18/19 0411  WBC  --  10.5  --   --   --   --   CREATININE 1.46*  --  1.59* 1.54* 1.69* 1.82*    Estimated Creatinine Clearance: 40.3 mL/min (A) (by C-G formula based on SCr of 1.82 mg/dL (H)).    No Known Allergies  Estimated AUC 523 IBW used 59.3, Vd 53.8, Scr 1.82 CrCl using IBW ~30  Nicoletta Dress, PharmD PGY2 Infectious Disease Pharmacy Resident  [phone]   05/18/2019 9:09 AM

## 2019-05-18 NOTE — Progress Notes (Signed)
CSF WBC 2040 and glucose <20 reported to Dr Zada Finders and ID NP. Hgb 6.9 reported to Dr. Zada Finders as well.

## 2019-05-18 NOTE — Progress Notes (Signed)
[name] for Infectious Disease  Date of Admission:  04/15/2019      Total days of antibiotics 4  Day 3 Cefepime  Day 4 Vancomycin   Day 1 Levaquin            ASSESSMENT: [name] is a 63 y.o. female with bacterial meningitis in the setting of ongoing CSF leak with 3rd lumbar drain in place currently. Her repeat CSF profile today reveals worsening pleocytosis (850 - 2200 cells, > 90% neuts), glucose < 20, elevated protein. Clinically aside from her recurrent fevers she is doing well.   Nothing growing on her cultures at this time from sampling on 9/06 and gram stain from today is also without organisms. ?atypical bacterial organism involvement - Will add levaquin (concern for possible untreated stenotrophomonas given all her recent CNS manipulation). Otherwise continue broad spectrum coverage with Vancomycin + cefepime.   ?If MRI imaging would be helpful to identify a possible un drained/durable foci of infection that we are missing on NCHCT -- will defer to Dr. Zada Finders for input. Clinically low suspicion but with lack of improvement on broad spectrum antibiotics may need further look.    PLAN: 1. Add levaquin once daily  2. Continue Vancomycin and Cefepime  3. Consider MRI to evaluate further given lack of improvement  4. Follow micro.    Principal Problem:   Acute bacterial meningitis Active Problems:   Diabetes mellitus type II, non insulin dependent (HCC)   AKI (acute kidney injury) ([place])   Pituitary adenoma (HCC)   CSF leak   . amLODipine  10 mg Oral Daily  . atorvastatin  40 mg Oral Daily  . Chlorhexidine Gluconate Cloth  6 each Topical Daily  . docusate sodium  100 mg Oral BID  . feeding supplement (ENSURE ENLIVE)  237 mL Oral BID BM  . Fluorescein Sodium  500 mg Intravenous Once  . gabapentin  300 mg Oral BID  . Gerhardt's butt cream   Topical BID  . heparin injection (subcutaneous)  5,000 Units Subcutaneous Q8H  . insulin aspart   0-20 Units Subcutaneous TID WC  . insulin aspart  0-5 Units Subcutaneous QHS  . iron polysaccharides  150 mg Oral BID  . pantoprazole  40 mg Oral Daily  . senna  1 tablet Oral BID  . white petrolatum        SUBJECTIVE: She continues to feel "OK". Had shaking chills with fevers last night. Temperatures up to 102.5 F last night and continues to have erratic pattern. No new neurologic complaints. 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 No headaches, mental status change, nausea/vomiting, neck pain.   Review of Systems: Review of Systems  Constitutional: Positive for chills and fever.  HENT: Negative for sinus pain and sore throat.   Eyes: Negative for photophobia.  Respiratory: Negative for cough and shortness of breath.   Cardiovascular: Negative for chest pain and leg swelling.  Gastrointestinal: Negative for abdominal pain and diarrhea.  Genitourinary: Negative for dysuria.  Musculoskeletal: Negative for back pain and myalgias.  Neurological: Negative for dizziness, focal weakness, seizures and headaches.    No Known Allergies  OBJECTIVE: Vitals:   05/18/19 0900 05/18/19 1000 05/18/19 1100 05/18/19 1200  BP: 140/73 132/70 124/72 118/72  Pulse: (!) 116 (!) 112 (!) 104 98  Resp: (!) 21 19 20 18   Temp:    (!) (P) 101.3 F (38.5 C)  TempSrc:    (P) Oral  SpO2: 95% 93% 92% 93%  Weight:      Height:       Body mass index is 38.25 kg/m.  Physical Exam Constitutional:      Comments: Resting comfortably in bed. Watching TV quietly. No distress.   HENT:     Nose: No congestion or rhinorrhea.  Eyes:     General: No scleral icterus.    Pupils: Pupils are equal, round, and reactive to light.  Cardiovascular:     Rate and Rhythm: Regular rhythm. Tachycardia present.     Pulses: Normal pulses.     Heart sounds: No murmur.  Pulmonary:     Effort: Pulmonary effort is normal.     Breath sounds: Normal breath sounds.     Comments: Off oxygen  Abdominal:      General: There is no distension.     Tenderness: There is no abdominal tenderness.  Musculoskeletal:        General: No swelling or tenderness.  Skin:    General: Skin is warm and dry.     Capillary Refill: Capillary refill takes less than 2 seconds.  Neurological:     Mental Status: She is oriented to person, place, and time.     Sensory: No sensory deficit.     Lab Results Lab Results  Component Value Date   WBC 11.3 (H) 05/18/2019   HGB 6.9 (LL) 05/18/2019   HCT 22.9 (L) 05/18/2019   MCV 87.4 05/18/2019   PLT 294 05/18/2019    Lab Results  Component Value Date   CREATININE 1.82 (H) 05/18/2019   BUN 11 05/18/2019   NA 137 05/18/2019   K 4.1 05/18/2019   CL 106 05/18/2019   CO2 23 05/18/2019    Lab Results  Component Value Date   ALT 25 10/28/2018   AST 41 10/28/2018   ALKPHOS 88 10/28/2018   BILITOT 0.7 10/28/2018     Microbiology: Recent Results (from the past 240 hour(s))  Culture, blood (routine x 2)     Status: None (Preliminary result)   Collection Time: 05/15/19  8:17 PM   Specimen: BLOOD RIGHT ARM  Result Value Ref Range Status   Specimen Description BLOOD RIGHT ARM  Final   Special Requests   Final    BOTTLES DRAWN AEROBIC ONLY Blood Culture results may not be optimal due to an inadequate volume of blood received in culture bottles   Culture   Final    NO GROWTH 3 DAYS Performed at [facility], [place] [address] [place], [place] [address]    Report Status PENDING  Incomplete  Culture, blood (routine x 2)     Status: None (Preliminary result)   Collection Time: 05/15/19  8:20 PM   Specimen: BLOOD RIGHT HAND  Result Value Ref Range Status   Specimen Description BLOOD RIGHT HAND  Final   Special Requests   Final    BOTTLES DRAWN AEROBIC ONLY Blood Culture results may not be optimal due to an inadequate volume of blood received in culture bottles   Culture   Final    NO GROWTH 3 DAYS Performed at [facility], [place] [address],  [place], [place] [address]    Report Status PENDING  Incomplete  CSF culture     Status: None (Preliminary result)   Collection Time: 05/15/19 10:19 PM   Specimen: CSF; Cerebrospinal Fluid  Result Value Ref Range Status   Specimen Description CSF  Final   Special Requests TUBE 2  Final   Gram Stain   Final    CYTOSPIN SMEAR WBC PRESENT,BOTH PMN AND MONONUCLEAR NO ORGANISMS SEEN    Culture   Final    NO GROWTH 3 DAYS Performed at [facility], [address] [place], [place] [address]    Report Status PENDING  Incomplete  Culture, Urine     Status: Abnormal   Collection Time: 05/16/19 12:28 AM   Specimen: Urine, Random  Result Value Ref Range Status   Specimen Description URINE, RANDOM  Final   Special Requests   Final    NONE Performed at [facility], [place] [address] [place], [place] [address]    Culture >=100,000 COLONIES/mL ENTEROBACTER AEROGENES (A)  Final   Report Status 05/17/2019 FINAL  Final   Organism ID, Bacteria ENTEROBACTER AEROGENES (A)  Final      Susceptibility   Enterobacter aerogenes - MIC*    CEFAZOLIN >=64 RESISTANT Resistant     CEFTRIAXONE <=1 SENSITIVE Sensitive     CIPROFLOXACIN <=0.25 SENSITIVE Sensitive     GENTAMICIN <=1 SENSITIVE Sensitive     IMIPENEM 2 SENSITIVE Sensitive     NITROFURANTOIN 128 RESISTANT Resistant     TRIMETH/SULFA <=20 SENSITIVE Sensitive     PIP/TAZO <=4 SENSITIVE Sensitive     * >=100,000 COLONIES/mL ENTEROBACTER AEROGENES  CSF culture with Stat gram stain     Status: None (Preliminary result)   Collection Time: 05/18/19  9:07 AM   Specimen: CSF; Cerebrospinal Fluid  Result Value Ref Range Status   Specimen Description CSF  Final   Special Requests NONE  Final   Gram Stain   Final    WBC PRESENT,BOTH PMN AND MONONUCLEAR NO ORGANISMS SEEN CYTOSPIN SMEAR Performed at [facility], [place] [address], [place],  [address]    Culture PENDING  Incomplete   Report Status PENDING  Incomplete      Janene Madeira, MSN, NP-C [facility] [email or web address] Pager: [phone] Office: [phone] [place]: [phone]

## 2019-05-18 NOTE — Progress Notes (Addendum)
Neurosurgery Service Progress Note  Subjective: No acute events overnight, no drainage overnight, temp to 38.2 earlier this morning  Objective: Vitals:   05/18/19 0300 05/18/19 0400 05/18/19 0500 05/18/19 0600  BP: 133/71 139/75 136/78 (!) 146/79  Pulse: (!) 106 (!) 111 (!) 115 (!) 117  Resp: 17 16 18 15   Temp:  (!) 100.7 F (38.2 C)    TempSrc:  Axillary    SpO2: 97% 99% 98% 98%  Weight:      Height:       Temp (24hrs), Avg:101 F (38.3 C), Min:100.1 F (37.8 C), Max:102.5 F (39.2 C)  CBC Latest Ref Rng & Units 05/15/2019 05/06/2019 05/04/2019  WBC 4.0 - 10.5 K/uL 10.5 9.6 7.5  Hemoglobin 12.0 - 15.0 g/dL 8.7(L) 7.3(L) 8.7(L)  Hematocrit 36.0 - 46.0 % 28.9(L) 23.1(L) 29.0(L)  Platelets 150 - 400 K/uL 295 197 263   BMP Latest Ref Rng & Units 05/18/2019 05/17/2019 05/17/2019  Glucose 70 - 99 mg/dL 162(H) 207(H) 305(H)  BUN 8 - 23 mg/dL 11 10 8   Creatinine 0.44 - 1.00 mg/dL 1.82(H) 1.69(H) 1.54(H)  Sodium 135 - 145 mmol/L 137 139 139  Potassium 3.5 - 5.1 mmol/L 4.1 4.2 4.3  Chloride 98 - 111 mmol/L 106 107 106  CO2 22 - 32 mmol/L 23 21(L) 23  Calcium 8.9 - 10.3 mg/dL 8.6(L) 8.4(L) 8.6(L)    Intake/Output Summary (Last 24 hours) at 05/18/2019 0818 Last data filed at 05/18/2019 0600 Gross per 24 hour  Intake 2923.43 ml  Output 5375 ml  Net -2451.57 ml    Current Facility-Administered Medications:  .  0.9 %  sodium chloride infusion, , Intravenous, PRN, Newman Pies, MD, Last Rate: 10 mL/hr at 05/18/19 0600 .  0.9 %  sodium chloride infusion, , Intravenous, Continuous, Newman Pies, MD, Last Rate: 75 mL/hr at 05/18/19 0600 .  acetaminophen (TYLENOL) tablet 650 mg, 650 mg, Oral, Q4H PRN, 650 mg at 05/17/19 2028 **OR** acetaminophen (TYLENOL) suppository 650 mg, 650 mg, Rectal, Q4H PRN, Newman Pies, MD, 650 mg at 05/16/19 0746 .  alum & mag hydroxide-simeth (MAALOX/MYLANTA) 200-200-20 MG/5ML suspension 30 mL, 30 mL, Oral, Q6H PRN, Newman Pies, MD, 30 mL at 05/05/19  1523 .  amLODipine (NORVASC) tablet 10 mg, 10 mg, Oral, Daily, Newman Pies, MD, 10 mg at 05/17/19 1003 .  atorvastatin (LIPITOR) tablet 40 mg, 40 mg, Oral, Daily, Newman Pies, MD, 40 mg at 05/17/19 1813 .  ceFEPIme (MAXIPIME) 2 g in sodium chloride 0.9 % 100 mL IVPB, 2 g, Intravenous, Q12H, Bergman, Meghan D, NP, Stopped at 05/18/19 0541 .  Chlorhexidine Gluconate Cloth 2 % PADS 6 each, 6 each, Topical, Daily, Newman Pies, MD, 6 each at 05/17/19 1556 .  docusate sodium (COLACE) capsule 100 mg, 100 mg, Oral, BID, Newman Pies, MD, 100 mg at 05/17/19 2142 .  feeding supplement (ENSURE ENLIVE) (ENSURE ENLIVE) liquid 237 mL, 237 mL, Oral, BID BM, Newman Pies, MD, 237 mL at 05/17/19 1011 .  Fluorescein Sodium 10 % injection 500 mg, 500 mg, Intravenous, Once, Newman Pies, MD .  gabapentin (NEURONTIN) capsule 300 mg, 300 mg, Oral, BID, Newman Pies, MD, 300 mg at 05/17/19 2142 .  Gerhardt's butt cream, , Topical, BID, Newman Pies, MD, 1 application at 0000000 2146 .  HYDROcodone-acetaminophen (NORCO/VICODIN) 5-325 MG per tablet 1 tablet, 1 tablet, Oral, Q4H PRN, Newman Pies, MD, 1 tablet at 05/18/19 0044 .  HYDROmorphone (DILAUDID) injection 0.5 mg, 0.5 mg, Intravenous, Q3H PRN, Newman Pies, MD, 0.5 mg at  04/26/19 0939 .  insulin aspart (novoLOG) injection 0-20 Units, 0-20 Units, Subcutaneous, TID WC, Newman Pies, MD, 3 Units at 05/17/19 1710 .  insulin aspart (novoLOG) injection 0-5 Units, 0-5 Units, Subcutaneous, QHS, Newman Pies, MD, 2 Units at 05/16/19 2126 .  iron polysaccharides (NIFEREX) capsule 150 mg, 150 mg, Oral, BID, Newman Pies, MD, 150 mg at 05/17/19 2142 .  labetalol (NORMODYNE) injection 10-40 mg, 10-40 mg, Intravenous, Q10 min PRN, Newman Pies, MD, 20 mg at 05/16/19 2307 .  lidocaine (PF) (XYLOCAINE) 1 % injection, , , PRN, Jacqulynn Cadet, MD, 10 mL at 04/19/19 1436 .  ondansetron (ZOFRAN) tablet 4 mg, 4 mg, Oral, Q4H  PRN, 4 mg at 04/30/19 1043 **OR** ondansetron (ZOFRAN) injection 4 mg, 4 mg, Intravenous, Q4H PRN, Newman Pies, MD, 4 mg at 05/16/19 0910 .  ondansetron (ZOFRAN) tablet 4 mg, 4 mg, Oral, Q6H PRN **OR** ondansetron (ZOFRAN) injection 4 mg, 4 mg, Intravenous, Q6H PRN, Newman Pies, MD, 4 mg at 05/05/19 0941 .  pantoprazole (PROTONIX) EC tablet 40 mg, 40 mg, Oral, Daily, Newman Pies, MD, 40 mg at 05/17/19 1004 .  polyethylene glycol (MIRALAX / GLYCOLAX) packet 17 g, 17 g, Oral, Daily PRN, Newman Pies, MD .  promethazine [facility]) injection 12.5-25 mg, 12.5-25 mg, Intravenous, Q4H PRN, Newman Pies, MD, 25 mg at 05/15/19 2239 .  promethazine (PHENERGAN) tablet 12.5-25 mg, 12.5-25 mg, Oral, Q4H PRN, Newman Pies, MD .  senna [facility]) tablet 8.6 mg, 1 tablet, Oral, BID, Newman Pies, MD, 8.6 mg at 05/17/19 2142 .  sodium chloride (OCEAN) 0.65 % nasal spray 4 spray, 4 spray, Each Nare, PRN, Newman Pies, MD .  tiZANidine (ZANAFLEX) tablet 4 mg, 4 mg, Oral, Q8H PRN, Newman Pies, MD, 4 mg at 04/20/19 1554 .  vancomycin (VANCOCIN) IVPB 1000 mg/200 mL premix, 1,000 mg, Intravenous, Q12H, Newman Pies, MD, Stopped at 05/18/19 0018   Physical Exam: Awake, Ox3, PERRL, EOMI, FS, Strength 5/5 x4, SILTx4, VFF with mild bitemporal hemianopsia No rhinorrhea on exam this morning Abdominal incisions c/d/i  Assessment & Plan: 63 y.o. 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 Failed bedside lumbar drain placement. 8/11 LD placed under fluoro, minimal output, 8/14 LD removed; 8/16 rhinorrhea, LD replaced, 8/19 somnolence, CTH w/ large volume pneumocephalus, LD clamped, 8/21 s/p repeat endonasal skull base reconstruction, 8/25 rpt CTH with resolving pneumocephalus, febrile started on  vanc/cefepime  -f/u CSF Cx, NGTD, will send repeat CSF cell counts / protein / glucose / Cx today -continue LD @ 15cc/hr -likely shunt placement next week pending fever curve, CSF Cx, ID recs -Abx per ID -cont qd RFPs -SCDs/TEDs/SQH  Judith Part  05/18/19 8:18 AM

## 2019-05-19 LAB — CSF CULTURE W GRAM STAIN: Culture: NO GROWTH

## 2019-05-19 LAB — CBC
HCT: 26.2 % — ABNORMAL LOW (ref 36.0–46.0)
Hemoglobin: 7.8 g/dL — ABNORMAL LOW (ref 12.0–15.0)
MCH: 26.2 pg (ref 26.0–34.0)
MCHC: 29.8 g/dL — ABNORMAL LOW (ref 30.0–36.0)
MCV: 87.9 fL (ref 80.0–100.0)
Platelets: 319 10*3/uL (ref 150–400)
RBC: 2.98 MIL/uL — ABNORMAL LOW (ref 3.87–5.11)
RDW: 15.5 % (ref 11.5–15.5)
WBC: 11.8 10*3/uL — ABNORMAL HIGH (ref 4.0–10.5)
nRBC: 0 % (ref 0.0–0.2)

## 2019-05-19 LAB — RENAL FUNCTION PANEL
Albumin: 2.9 g/dL — ABNORMAL LOW (ref 3.5–5.0)
Anion gap: 13 (ref 5–15)
BUN: 11 mg/dL (ref 8–23)
CO2: 21 mmol/L — ABNORMAL LOW (ref 22–32)
Calcium: 8.9 mg/dL (ref 8.9–10.3)
Chloride: 102 mmol/L (ref 98–111)
Creatinine, Ser: 1.55 mg/dL — ABNORMAL HIGH (ref 0.44–1.00)
GFR calc Af Amer: 41 mL/min — ABNORMAL LOW (ref >60)
GFR calc non Af Amer: 36 mL/min — ABNORMAL LOW (ref >60)
Glucose, Bld: 171 mg/dL — ABNORMAL HIGH (ref 70–99)
Phosphorus: 2.8 mg/dL (ref 2.5–4.6)
Potassium: 4.2 mmol/L (ref 3.5–5.1)
Sodium: 136 mmol/L (ref 135–145)

## 2019-05-19 LAB — GLUCOSE, CAPILLARY
Glucose-Capillary: 140 mg/dL — ABNORMAL HIGH (ref 70–99)
Glucose-Capillary: 165 mg/dL — ABNORMAL HIGH (ref 70–99)
Glucose-Capillary: 151 mg/dL — ABNORMAL HIGH (ref 70–99)
Glucose-Capillary: 145 mg/dL — ABNORMAL HIGH (ref 70–99)
Glucose-Capillary: 122 mg/dL — ABNORMAL HIGH (ref 70–99)

## 2019-05-19 MED ORDER — LIP MEDEX EX OINT
TOPICAL_OINTMENT | CUTANEOUS | Status: DC | PRN
  Filled 2019-05-19 (×2): qty 7

## 2019-05-19 NOTE — Progress Notes (Signed)
[facility] for Infectious Disease  Date of Admission:  04/15/2019      Total days of antibiotics 5  Day 4 Cefepime  Day 5 Vancomycin   Day 2 Levaquin            ASSESSMENT: [name] is a 63 y.o. female with bacterial meningitis in the setting of ongoing CSF leak with 3rd lumbar drain in place currently. Her repeat CSF after > 48 hours of antibiotics revealed worsening pleocytosis (850 - 2200 cells, > 90% neuts), glucose < 20, elevated protein.   She has continued to have ongoing fevers to 63 F Nothing growing on CSF cultures from 9/06 or 9/09. AFB pending (low suspicion for non-tuberculosis mycobacterium, however would like to rule out given her multiple CNS related procedures).  Agree that the pattern of CSF her profile does not fit with aseptic or fungal process given the profoundly neutrophilic and low glycemic findings. Dr. Colleen Can noted reviewed. Plan for possible MRI tomorrow if fevers ongoing to look for atypical source.   Will obtain another set of blood cultures today. Her abdominal wound has some dehiscence but no evidence of infection at this time; no surrounding erythema or tenderness with deep/superficial palpation. Continue to follow.   PLAN:  1. Continue Vancomycin, Levaquin and Cefepime  2. MRI tomorrow if no improvement in fevers 3. Follow micro   Principal Problem:   Acute bacterial meningitis Active Problems:   Diabetes mellitus type II, non insulin dependent (HCC)   AKI (acute kidney injury) ([place])   Pituitary adenoma (HCC)   CSF leak   . amLODipine  10 mg Oral Daily  . atorvastatin  40 mg Oral Daily  . Chlorhexidine Gluconate Cloth  6 each Topical Daily  . docusate sodium  100 mg Oral BID  . feeding supplement (ENSURE ENLIVE)  237 mL Oral BID BM  . Fluorescein Sodium  500 mg Intravenous Once  . gabapentin  300 mg Oral BID  . Gerhardt's butt cream   Topical BID  . heparin injection (subcutaneous)  5,000 Units Subcutaneous  Q8H  . insulin aspart  0-20 Units Subcutaneous TID WC  . insulin aspart  0-5 Units Subcutaneous QHS  . iron polysaccharides  150 mg Oral BID  . pantoprazole  40 mg Oral Daily  . senna  1 tablet Oral BID    SUBJECTIVE: Overnight had some nausea/vomiting and a headache - she was not able to tell me or remember where her headache occurred but that it resolved with tylenol. Fevers are responsive to tylenol dosing.   Continues to have no other additions to ROS or concerns that would suggest alternative explanation for fever.   Temp Readings from Last 3 Encounters:  05/19/19 100.2 F (37.9 C) (Oral)  04/06/19 (!) 97.2 F (36.2 C) (Oral)  11/11/18 97.8 F (36.6 C) (Oral)   Lab Results  Component Value Date   WBC 11.8 (H) 05/19/2019   Lab Results  Component Value Date   CREATININE 1.55 (H) 05/19/2019    Review of Systems: Review of Systems  Constitutional: Positive for chills and fever.  HENT: Negative for sinus pain and sore throat.   Eyes: Negative for photophobia.  Respiratory: Negative for cough and shortness of breath.   Cardiovascular: Negative for chest pain and leg swelling.  Gastrointestinal: Negative for abdominal pain and diarrhea.  Genitourinary: Negative for dysuria.  Musculoskeletal: Negative for back pain and myalgias.  Neurological: Negative for dizziness, focal weakness, seizures  and headaches.    No Known Allergies  OBJECTIVE: Vitals:   05/19/19 1000 05/19/19 1100 05/19/19 1200 05/19/19 1205  BP: 135/70 129/74 (!) 118/58   Pulse: (!) 108 (!) 108 97   Resp: 19 18 15    Temp:    100.2 F (37.9 C)  TempSrc:    Oral  SpO2: 92% 94% 97%   Weight:      Height:       Body mass index is 38.25 kg/m.  Physical Exam Constitutional:      Comments: Resting comfortably in bed. Watching TV quietly. No distress.   HENT:     Nose: No congestion or rhinorrhea.  Eyes:     General: No scleral icterus.    Pupils: Pupils are equal, round, and reactive to light.   Cardiovascular:     Rate and Rhythm: Regular rhythm. Tachycardia present.     Pulses: Normal pulses.     Heart sounds: No murmur.  Pulmonary:     Effort: Pulmonary effort is normal.     Breath sounds: Normal breath sounds.     Comments: Off oxygen  Abdominal:     General: There is no distension.     Tenderness: There is no abdominal tenderness.  Musculoskeletal:        General: No swelling or tenderness.  Skin:    General: Skin is warm and dry.     Capillary Refill: Capillary refill takes less than 2 seconds.  Neurological:     Mental Status: She is oriented to person, place, and time.     Sensory: No sensory deficit.     Lab Results Lab Results  Component Value Date   WBC 11.8 (H) 05/19/2019   HGB 7.8 (L) 05/19/2019   HCT 26.2 (L) 05/19/2019   MCV 87.9 05/19/2019   PLT 319 05/19/2019    Lab Results  Component Value Date   CREATININE 1.55 (H) 05/19/2019   BUN 11 05/19/2019   NA 136 05/19/2019   K 4.2 05/19/2019   CL 102 05/19/2019   CO2 21 (L) 05/19/2019    Lab Results  Component Value Date   ALT 25 10/28/2018   AST 41 10/28/2018   ALKPHOS 88 10/28/2018   BILITOT 0.7 10/28/2018     Microbiology: Recent Results (from the past 240 hour(s))  Culture, blood (routine x 2)     Status: None (Preliminary result)   Collection Time: 05/15/19  8:17 PM   Specimen: BLOOD RIGHT ARM  Result Value Ref Range Status   Specimen Description BLOOD RIGHT ARM  Final   Special Requests   Final    BOTTLES DRAWN AEROBIC ONLY Blood Culture results may not be optimal due to an inadequate volume of blood received in culture bottles   Culture   Final    NO GROWTH 3 DAYS Performed at [facility], [place] [address] [place], [place] [address]    Report Status PENDING  Incomplete  Culture, blood (routine x 2)     Status: None (Preliminary result)   Collection Time: 05/15/19  8:20 PM   Specimen: BLOOD RIGHT HAND  Result Value Ref Range Status   Specimen Description BLOOD RIGHT  HAND  Final   Special Requests   Final    BOTTLES DRAWN AEROBIC ONLY Blood Culture results may not be optimal due to an inadequate volume of blood received in culture bottles   Culture   Final    NO GROWTH 3 DAYS Performed at [facility]  Lab, [address] [place], [place] [address]    Report Status PENDING  Incomplete  CSF culture     Status: None   Collection Time: 05/15/19 10:19 PM   Specimen: CSF; Cerebrospinal Fluid  Result Value Ref Range Status   Specimen Description CSF  Final   Special Requests TUBE 2  Final   Gram Stain   Final    CYTOSPIN SMEAR WBC PRESENT,BOTH PMN AND MONONUCLEAR NO ORGANISMS SEEN    Culture   Final    NO GROWTH 3 DAYS Performed at [facility], [place] [address], [place], [place] [address]    Report Status 05/19/2019 FINAL  Final  Culture, Urine     Status: Abnormal   Collection Time: 05/16/19 12:28 AM   Specimen: Urine, Random  Result Value Ref Range Status   Specimen Description URINE, RANDOM  Final   Special Requests   Final    NONE Performed at [facility], [place] [address], [place], [place] [address]    Culture >=100,000 COLONIES/mL ENTEROBACTER AEROGENES (A)  Final   Report Status 05/17/2019 FINAL  Final   Organism ID, Bacteria ENTEROBACTER AEROGENES (A)  Final      Susceptibility   Enterobacter aerogenes - MIC*    CEFAZOLIN >=64 RESISTANT Resistant     CEFTRIAXONE <=1 SENSITIVE Sensitive     CIPROFLOXACIN <=0.25 SENSITIVE Sensitive     GENTAMICIN <=1 SENSITIVE Sensitive     IMIPENEM 2 SENSITIVE Sensitive     NITROFURANTOIN 128 RESISTANT Resistant     TRIMETH/SULFA <=20 SENSITIVE Sensitive     PIP/TAZO <=4 SENSITIVE Sensitive     * >=100,000 COLONIES/mL ENTEROBACTER AEROGENES  CSF culture with Stat gram stain     Status: None (Preliminary result)   Collection Time: 05/18/19  9:07 AM   Specimen: CSF; Cerebrospinal Fluid  Result Value Ref Range Status   Specimen Description CSF  Final   Special Requests NONE  Final    Gram Stain   Final    WBC PRESENT,BOTH PMN AND MONONUCLEAR NO ORGANISMS SEEN CYTOSPIN SMEAR    Culture   Final    NO GROWTH < 24 HOURS Performed at [facility], [address], [place], [place] [address]    Report Status PENDING  Incomplete     Janene Madeira, MSN, NP-C [facility] for Infectious Disease [email or web address] Pager: [phone] Office: [phone] [place]: [phone]

## 2019-05-19 NOTE — Progress Notes (Signed)
Pt's temp 103.3, Dr.Ostergaurd notified, no new orders.

## 2019-05-19 NOTE — Consult Note (Signed)
[facility] Nurse wound consult note Reason for Consult:Dehisced surgical wound in the right abdomen. Last seen by my partner, K. Sanders on 04/28/19. Wound type:Surgical Pressure Injury POA:N/A Measurement: Bedside RN My will measure wound today and document on the flow sheet. Approximately 4cm x 1cm x 0.4cm Wound bed:red, wet Drainage (amount, consistency, odor) yellow Periwound:intact Dressing procedure/placement/frequency: I will implement a care plan directed toward absorption and use a product that will donate silver in an effort to enhance healing. SIlver hydrofiber (Aquacel Ag+, [facility] # [id number]) to be placed into defect and topped with dry gauze, secured with tape. Changes are to be daily.  I am assisted in my assessment today by Bedside RN, My. Her expertise and familiarity with the patient is appreciated.  [facility] nursing team will not follow, but will remain available to this patient, the nursing and medical teams.  Please re-consult if needed. Thanks, Maudie Flakes, MSN, RN, [place], Arther Abbott  Pager# [phone]

## 2019-05-19 NOTE — Progress Notes (Signed)
Neurosurgery Service Progress Note  Subjective: No acute events overnight, no drainage overnight, temp to 39.6 this morning  Objective: Vitals:   05/19/19 0500 05/19/19 0700 05/19/19 0800 05/19/19 0834  BP: (!) 142/72 139/73 (!) 158/80   Pulse: (!) 109 (!) 115 (!) 116   Resp: 15 15 16    Temp:    (!) 103.3 F (39.6 C)  TempSrc:    Axillary  SpO2: 96% 96% 98%   Weight:      Height:       Temp (24hrs), Avg:100.4 F (38 C), Min:97.6 F (36.4 C), Max:103.3 F (39.6 C)  CBC Latest Ref Rng & Units 05/19/2019 05/18/2019 05/18/2019  WBC 4.0 - 10.5 K/uL 11.8(H) 10.7(H) 11.3(H)  Hemoglobin 12.0 - 15.0 g/dL 7.8(L) 7.5(L) 6.9(LL)  Hematocrit 36.0 - 46.0 % 26.2(L) 24.8(L) 22.9(L)  Platelets 150 - 400 K/uL 319 298 294   BMP Latest Ref Rng & Units 05/19/2019 05/18/2019 05/17/2019  Glucose 70 - 99 mg/dL 171(H) 162(H) 207(H)  BUN 8 - 23 mg/dL 11 11 10   Creatinine 0.44 - 1.00 mg/dL 1.55(H) 1.82(H) 1.69(H)  Sodium 135 - 145 mmol/L 136 137 139  Potassium 3.5 - 5.1 mmol/L 4.2 4.1 4.2  Chloride 98 - 111 mmol/L 102 106 107  CO2 22 - 32 mmol/L 21(L) 23 21(L)  Calcium 8.9 - 10.3 mg/dL 8.9 8.6(L) 8.4(L)    Intake/Output Summary (Last 24 hours) at 05/19/2019 0907 Last data filed at 05/19/2019 0800 Gross per 24 hour  Intake 2245.46 ml  Output 5001 ml  Net -2755.54 ml    Current Facility-Administered Medications:  .  0.9 %  sodium chloride infusion, , Intravenous, PRN, Newman Pies, MD, Stopped at 05/19/19 0501 .  0.9 %  sodium chloride infusion, , Intravenous, Continuous, Newman Pies, MD, Last Rate: 75 mL/hr at 05/19/19 0600 .  acetaminophen (TYLENOL) tablet 650 mg, 650 mg, Oral, Q4H PRN, 650 mg at 05/19/19 0852 **OR** acetaminophen (TYLENOL) suppository 650 mg, 650 mg, Rectal, Q4H PRN, Newman Pies, MD, 650 mg at 05/16/19 0746 .  alum & mag hydroxide-simeth (MAALOX/MYLANTA) 200-200-20 MG/5ML suspension 30 mL, 30 mL, Oral, Q6H PRN, Newman Pies, MD, 30 mL at 05/05/19 1523 .  amLODipine  (NORVASC) tablet 10 mg, 10 mg, Oral, Daily, Newman Pies, MD, 10 mg at 05/19/19 0852 .  atorvastatin (LIPITOR) tablet 40 mg, 40 mg, Oral, Daily, Newman Pies, MD, 40 mg at 05/18/19 1743 .  ceFEPIme (MAXIPIME) 2 g in sodium chloride 0.9 % 100 mL IVPB, 2 g, Intravenous, Q12H, Bergman, Meghan D, NP, Last Rate: 200 mL/hr at 05/19/19 0646, 2 g at 05/19/19 0646 .  Chlorhexidine Gluconate Cloth 2 % PADS 6 each, 6 each, Topical, Daily, Newman Pies, MD, 6 each at 05/18/19 1230 .  docusate sodium (COLACE) capsule 100 mg, 100 mg, Oral, BID, Newman Pies, MD, 100 mg at 05/19/19 0852 .  feeding supplement (ENSURE ENLIVE) (ENSURE ENLIVE) liquid 237 mL, 237 mL, Oral, BID BM, Newman Pies, MD, 237 mL at 05/17/19 1011 .  Fluorescein Sodium 10 % injection 500 mg, 500 mg, Intravenous, Once, Newman Pies, MD .  gabapentin (NEURONTIN) capsule 300 mg, 300 mg, Oral, BID, Newman Pies, MD, 300 mg at 05/19/19 0852 .  Gerhardt's butt cream, , Topical, BID, Newman Pies, MD .  heparin injection 5,000 Units, 5,000 Units, Subcutaneous, Q8H, Judith Part, MD, 5,000 Units at 05/19/19 0501 .  HYDROcodone-acetaminophen (NORCO/VICODIN) 5-325 MG per tablet 1 tablet, 1 tablet, Oral, Q4H PRN, Newman Pies, MD, 1 tablet at 05/19/19 0236 .  HYDROmorphone (DILAUDID) injection 0.5 mg, 0.5 mg, Intravenous, Q3H PRN, Newman Pies, MD, 0.5 mg at 04/26/19 0939 .  insulin aspart (novoLOG) injection 0-20 Units, 0-20 Units, Subcutaneous, TID WC, Newman Pies, MD, 4 Units at 05/19/19 [id number] .  insulin aspart (novoLOG) injection 0-5 Units, 0-5 Units, Subcutaneous, QHS, Newman Pies, MD, 2 Units at 05/16/19 2126 .  iron polysaccharides (NIFEREX) capsule 150 mg, 150 mg, Oral, BID, Newman Pies, MD, 150 mg at 05/18/19 2220 .  labetalol (NORMODYNE) injection 10-40 mg, 10-40 mg, Intravenous, Q10 min PRN, Newman Pies, MD, 20 mg at 05/16/19 2307 .  [START ON 05/20/2019] levofloxacin (LEVAQUIN)  IVPB 750 mg, 750 mg, Intravenous, Q48H, Mikhia Dusek A, MD .  lidocaine (PF) (XYLOCAINE) 1 % injection, , , PRN, Jacqulynn Cadet, MD, 10 mL at 04/19/19 1436 .  ondansetron (ZOFRAN) tablet 4 mg, 4 mg, Oral, Q4H PRN, 4 mg at 04/30/19 1043 **OR** ondansetron (ZOFRAN) injection 4 mg, 4 mg, Intravenous, Q4H PRN, Newman Pies, MD, 4 mg at 05/16/19 0910 .  ondansetron (ZOFRAN) tablet 4 mg, 4 mg, Oral, Q6H PRN **OR** ondansetron (ZOFRAN) injection 4 mg, 4 mg, Intravenous, Q6H PRN, Newman Pies, MD, 4 mg at 05/05/19 0941 .  pantoprazole (PROTONIX) EC tablet 40 mg, 40 mg, Oral, Daily, Newman Pies, MD, 40 mg at 05/19/19 0852 .  polyethylene glycol (MIRALAX / GLYCOLAX) packet 17 g, 17 g, Oral, Daily PRN, Newman Pies, MD .  promethazine [facility]) injection 12.5-25 mg, 12.5-25 mg, Intravenous, Q4H PRN, Newman Pies, MD, 25 mg at 05/15/19 2239 .  promethazine (PHENERGAN) tablet 12.5-25 mg, 12.5-25 mg, Oral, Q4H PRN, Newman Pies, MD .  senna [facility]) tablet 8.6 mg, 1 tablet, Oral, BID, Newman Pies, MD, 8.6 mg at 05/19/19 0853 .  sodium chloride (OCEAN) 0.65 % nasal spray 4 spray, 4 spray, Each Nare, PRN, Newman Pies, MD .  tiZANidine (ZANAFLEX) tablet 4 mg, 4 mg, Oral, Q8H PRN, Newman Pies, MD, 4 mg at 04/20/19 1554 .  vancomycin (VANCOCIN) 1,250 mg in sodium chloride 0.9 % 250 mL IVPB, 1,250 mg, Intravenous, Q36H, Judith Part, MD, Last Rate: 166.7 mL/hr at 05/19/19 0600   Physical Exam: Awake, Ox3, PERRL, EOMI, FS, Strength 5/5 x4, SILTx4, VFF with mild bitemporal hemianopsia No rhinorrhea on exam this morning Abdominal incision packed on R, healing well on L  Assessment & Plan: 63 y.o. 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 Failed bedside lumbar drain placement. 8/11 LD  placed under fluoro, minimal output, 8/14 LD removed; 8/16 rhinorrhea, LD replaced, 8/19 somnolence, CTH w/ large volume pneumocephalus, LD clamped, 8/21 s/p repeat endonasal skull base reconstruction, 8/25 rpt CTH with resolving pneumocephalus, febrile started on vanc/cefepime  -R abdominal wound with some dehiscence, but healthy tissue underneath. Sutures removed at bedside this morning and superficially debrided, continue w/ wound care w/ daily packing, should heal well via secondary intention -f/u CSF Cx, NGTD, still febrile on ABx. Given the magnitude/diff of CSF WBCs, unlikely to be aseptic meningitis. If no improvement in fever curve tomorrow, we can get a new MRI. Transfers place her lumbar drain at risk of removal and the MRI is likely very low yield, but if her fevers do not improve then I think we should check to rule out an atypical process. -continue LD @ 15cc/hr -likely shunt placement next week pending fever curve, CSF Cx, ID recs -Abx per ID -cont  qd RFPs, CBCs -SCDs/TEDs/SQH  Judith Part  05/19/19 9:07 AM

## 2019-05-20 ENCOUNTER — Inpatient Hospital Stay (HOSPITAL_COMMUNITY): Payer: BC Managed Care – PPO

## 2019-05-20 LAB — DIFFERENTIAL
Abs Immature Granulocytes: 0.05 10*3/uL (ref 0.00–0.07)
Basophils Absolute: 0 10*3/uL (ref 0.0–0.1)
Basophils Relative: 0 %
Eosinophils Absolute: 0.2 10*3/uL (ref 0.0–0.5)
Eosinophils Relative: 2 %
Immature Granulocytes: 1 %
Lymphocytes Relative: 26 %
Lymphs Abs: 1.7 10*3/uL (ref 0.7–4.0)
Monocytes Absolute: 0.4 10*3/uL (ref 0.1–1.0)
Monocytes Relative: 6 %
Neutro Abs: 4.3 10*3/uL (ref 1.7–7.7)
Neutrophils Relative %: 65 %

## 2019-05-20 LAB — CULTURE, BLOOD (ROUTINE X 2)
Culture: NO GROWTH
Culture: NO GROWTH

## 2019-05-20 LAB — CBC
HCT: 24.6 % — ABNORMAL LOW (ref 36.0–46.0)
HCT: 24.7 % — ABNORMAL LOW (ref 36.0–46.0)
Hemoglobin: 7.3 g/dL — ABNORMAL LOW (ref 12.0–15.0)
Hemoglobin: 7.3 g/dL — ABNORMAL LOW (ref 12.0–15.0)
MCH: 26 pg (ref 26.0–34.0)
MCH: 26 pg (ref 26.0–34.0)
MCHC: 29.7 g/dL — ABNORMAL LOW (ref 30.0–36.0)
MCHC: 29.6 g/dL — ABNORMAL LOW (ref 30.0–36.0)
MCV: 87.5 fL (ref 80.0–100.0)
MCV: 87.9 fL (ref 80.0–100.0)
Platelets: 305 10*3/uL (ref 150–400)
Platelets: 309 10*3/uL (ref 150–400)
RBC: 2.81 MIL/uL — ABNORMAL LOW (ref 3.87–5.11)
RBC: 2.81 MIL/uL — ABNORMAL LOW (ref 3.87–5.11)
RDW: 15.3 % (ref 11.5–15.5)
RDW: 15.3 % (ref 11.5–15.5)
WBC: 7.8 10*3/uL (ref 4.0–10.5)
WBC: 6.6 10*3/uL (ref 4.0–10.5)
nRBC: 0.4 % — ABNORMAL HIGH (ref 0.0–0.2)
nRBC: 0.3 % — ABNORMAL HIGH (ref 0.0–0.2)

## 2019-05-20 LAB — RENAL FUNCTION PANEL
Albumin: 3 g/dL — ABNORMAL LOW (ref 3.5–5.0)
Anion gap: 12 (ref 5–15)
BUN: 11 mg/dL (ref 8–23)
CO2: 20 mmol/L — ABNORMAL LOW (ref 22–32)
Calcium: 8.9 mg/dL (ref 8.9–10.3)
Chloride: 102 mmol/L (ref 98–111)
Creatinine, Ser: 1.63 mg/dL — ABNORMAL HIGH (ref 0.44–1.00)
GFR calc Af Amer: 39 mL/min — ABNORMAL LOW (ref >60)
GFR calc non Af Amer: 34 mL/min — ABNORMAL LOW (ref >60)
Glucose, Bld: 161 mg/dL — ABNORMAL HIGH (ref 70–99)
Phosphorus: 2.9 mg/dL (ref 2.5–4.6)
Potassium: 3.6 mmol/L (ref 3.5–5.1)
Sodium: 134 mmol/L — ABNORMAL LOW (ref 135–145)

## 2019-05-20 LAB — GLUCOSE, CAPILLARY
Glucose-Capillary: 160 mg/dL — ABNORMAL HIGH (ref 70–99)
Glucose-Capillary: 143 mg/dL — ABNORMAL HIGH (ref 70–99)
Glucose-Capillary: 147 mg/dL — ABNORMAL HIGH (ref 70–99)
Glucose-Capillary: 117 mg/dL — ABNORMAL HIGH (ref 70–99)

## 2019-05-20 LAB — PATHOLOGIST SMEAR REVIEW

## 2019-05-20 LAB — VANCOMYCIN, RANDOM: Vancomycin Rm: 17

## 2019-05-20 MED ORDER — VANCOMYCIN HCL 10 G IV SOLR
1250.0000 mg | INTRAVENOUS | Status: DC
  Administered 2019-05-21: 1250 mg via INTRAVENOUS
  Filled 2019-05-20: qty 1250

## 2019-05-20 MED ORDER — GADOBUTROL 1 MMOL/ML IV SOLN
10.0000 mL | Freq: Once | INTRAVENOUS | Status: AC | PRN
  Administered 2019-05-20: 10 mL via INTRAVENOUS

## 2019-05-20 NOTE — Progress Notes (Signed)
Pharmacy Antibiotic Note  [name] is a 63 y.o. female admitted on 04/15/2019 status post second CSF leak revision that included lumbar drain placement and endonasal repair and patch.  Pharmacy has been consulted for vancomycin dosing empirically for meningitis. Cultures remain negative at this time. 30 hour vancomycin random was 17. It is difficult to predict her clearance with this random level but since it has been 30 hours since her dose of 1250 mg it is clear she is not clearing vancomycin very well. I will extend her duration to q48h at this time.   Plan: Continue ceftriaxone to 2gm IV q12h Change vancomycin to 1250mg  IV q48h  Monitor for clinical course, LOT, and Scr trend   Height: 5\' 6"  (167.6 cm) Weight: 236 lb 15.9 oz (107.5 kg) IBW/kg (Calculated) : 59.3  Temp (24hrs), Avg:99.9 F (37.7 C), Min:99.4 F (37.4 C), Max:100.5 F (38.1 C)  Recent Labs  Lab 05/17/19 0056 05/17/19 0802 05/18/19 0411 05/18/19 1019 05/18/19 1938 05/19/19 0658 05/20/19 0814 05/20/19 1123  WBC  --   --   --  11.3* 10.7* 11.8* 7.8 6.6  CREATININE 1.54* 1.69* 1.82*  --   --  1.55* 1.63*  --   VANCORANDOM  --   --   --   --   --   --   --  17    Estimated Creatinine Clearance: 45 mL/min (A) (by C-G formula based on SCr of 1.63 mg/dL (H)).    No Known Allergies  Nicoletta Dress, PharmD PGY2 Infectious Disease Pharmacy Resident  [id number]   05/20/2019 12:53 PM

## 2019-05-20 NOTE — Progress Notes (Signed)
Called to check on MRI. They will get to [place] as soon as they can.

## 2019-05-20 NOTE — Progress Notes (Addendum)
Neurosurgery Service Progress Note  Subjective: No acute events overnight, no drainage overnight, fever curve appears improved overnight  Objective: Vitals:   05/20/19 0400 05/20/19 0500 05/20/19 0600 05/20/19 0700  BP: (!) 168/80 121/67 121/64 110/69  Pulse: (!) 105 91 92 95  Resp: 15 15 15 19   Temp: 99.4 F (37.4 C)     TempSrc: Axillary     SpO2: 98% 98% 100% 98%  Weight:      Height:       Temp (24hrs), Avg:100.5 F (38.1 C), Min:99.4 F (37.4 C), Max:103.3 F (39.6 C)  CBC Latest Ref Rng & Units 05/19/2019 05/18/2019 05/18/2019  WBC 4.0 - 10.5 K/uL 11.8(H) 10.7(H) 11.3(H)  Hemoglobin 12.0 - 15.0 g/dL 7.8(L) 7.5(L) 6.9(LL)  Hematocrit 36.0 - 46.0 % 26.2(L) 24.8(L) 22.9(L)  Platelets 150 - 400 K/uL 319 298 294   BMP Latest Ref Rng & Units 05/19/2019 05/18/2019 05/17/2019  Glucose 70 - 99 mg/dL 171(H) 162(H) 207(H)  BUN 8 - 23 mg/dL 11 11 10   Creatinine 0.44 - 1.00 mg/dL 1.55(H) 1.82(H) 1.69(H)  Sodium 135 - 145 mmol/L 136 137 139  Potassium 3.5 - 5.1 mmol/L 4.2 4.1 4.2  Chloride 98 - 111 mmol/L 102 106 107  CO2 22 - 32 mmol/L 21(L) 23 21(L)  Calcium 8.9 - 10.3 mg/dL 8.9 8.6(L) 8.4(L)    Intake/Output Summary (Last 24 hours) at 05/20/2019 0755 Last data filed at 05/20/2019 0700 Gross per 24 hour  Intake 2270.37 ml  Output 4184 ml  Net -1913.63 ml    Current Facility-Administered Medications:  .  0.9 %  sodium chloride infusion, , Intravenous, PRN, Newman Pies, MD, Stopped at 05/19/19 2023 .  0.9 %  sodium chloride infusion, , Intravenous, Continuous, Newman Pies, MD, Last Rate: 75 mL/hr at 05/20/19 0600 .  acetaminophen (TYLENOL) tablet 650 mg, 650 mg, Oral, Q4H PRN, 650 mg at 05/20/19 0753 **OR** acetaminophen (TYLENOL) suppository 650 mg, 650 mg, Rectal, Q4H PRN, Newman Pies, MD, 650 mg at 05/16/19 0746 .  alum & mag hydroxide-simeth (MAALOX/MYLANTA) 200-200-20 MG/5ML suspension 30 mL, 30 mL, Oral, Q6H PRN, Newman Pies, MD, 30 mL at 05/05/19 1523 .   amLODipine (NORVASC) tablet 10 mg, 10 mg, Oral, Daily, Newman Pies, MD, 10 mg at 05/19/19 0852 .  atorvastatin (LIPITOR) tablet 40 mg, 40 mg, Oral, Daily, Newman Pies, MD, 40 mg at 05/19/19 1706 .  ceFEPIme (MAXIPIME) 2 g in sodium chloride 0.9 % 100 mL IVPB, 2 g, Intravenous, Q12H, Bergman, Meghan D, NP, Stopped at 05/20/19 0506 .  Chlorhexidine Gluconate Cloth 2 % PADS 6 each, 6 each, Topical, Daily, Newman Pies, MD, 6 each at 05/19/19 1103 .  docusate sodium (COLACE) capsule 100 mg, 100 mg, Oral, BID, Newman Pies, MD, 100 mg at 05/19/19 2100 .  feeding supplement (ENSURE ENLIVE) (ENSURE ENLIVE) liquid 237 mL, 237 mL, Oral, BID BM, Newman Pies, MD, 237 mL at 05/19/19 1513 .  Fluorescein Sodium 10 % injection 500 mg, 500 mg, Intravenous, Once, Newman Pies, MD .  gabapentin (NEURONTIN) capsule 300 mg, 300 mg, Oral, BID, Newman Pies, MD, 300 mg at 05/19/19 2100 .  Gerhardt's butt cream, , Topical, BID, Newman Pies, MD .  heparin injection 5,000 Units, 5,000 Units, Subcutaneous, Q8H, Judith Part, MD, 5,000 Units at 05/20/19 0434 .  HYDROcodone-acetaminophen (NORCO/VICODIN) 5-325 MG per tablet 1 tablet, 1 tablet, Oral, Q4H PRN, Newman Pies, MD, 1 tablet at 05/19/19 0236 .  HYDROmorphone (DILAUDID) injection 0.5 mg, 0.5 mg, Intravenous, Q3H PRN,  Newman Pies, MD, 0.5 mg at 04/26/19 [id number] .  insulin aspart (novoLOG) injection 0-20 Units, 0-20 Units, Subcutaneous, TID WC, Newman Pies, MD, 3 Units at 05/19/19 1700 .  insulin aspart (novoLOG) injection 0-5 Units, 0-5 Units, Subcutaneous, QHS, Newman Pies, MD, 2 Units at 05/16/19 2126 .  iron polysaccharides (NIFEREX) capsule 150 mg, 150 mg, Oral, BID, Newman Pies, MD, 150 mg at 05/19/19 2100 .  labetalol (NORMODYNE) injection 10-40 mg, 10-40 mg, Intravenous, Q10 min PRN, Newman Pies, MD, 20 mg at 05/20/19 0310 .  levofloxacin (LEVAQUIN) IVPB 750 mg, 750 mg, Intravenous, Q48H,  Saesha Llerenas A, MD .  lidocaine (PF) (XYLOCAINE) 1 % injection, , , PRN, Jacqulynn Cadet, MD, 10 mL at 04/19/19 1436 .  lip balm (CARMEX) ointment, , Topical, PRN, Judith Part, MD .  ondansetron (ZOFRAN) tablet 4 mg, 4 mg, Oral, Q4H PRN, 4 mg at 04/30/19 1043 **OR** ondansetron (ZOFRAN) injection 4 mg, 4 mg, Intravenous, Q4H PRN, Newman Pies, MD, 4 mg at 05/16/19 0910 .  ondansetron (ZOFRAN) tablet 4 mg, 4 mg, Oral, Q6H PRN **OR** ondansetron (ZOFRAN) injection 4 mg, 4 mg, Intravenous, Q6H PRN, Newman Pies, MD, 4 mg at 05/05/19 0941 .  pantoprazole (PROTONIX) EC tablet 40 mg, 40 mg, Oral, Daily, Newman Pies, MD, 40 mg at 05/19/19 0852 .  polyethylene glycol (MIRALAX / GLYCOLAX) packet 17 g, 17 g, Oral, Daily PRN, Newman Pies, MD .  promethazine [facility]) injection 12.5-25 mg, 12.5-25 mg, Intravenous, Q4H PRN, Newman Pies, MD, 25 mg at 05/15/19 2239 .  promethazine (PHENERGAN) tablet 12.5-25 mg, 12.5-25 mg, Oral, Q4H PRN, Newman Pies, MD .  senna [facility]) tablet 8.6 mg, 1 tablet, Oral, BID, Newman Pies, MD, 8.6 mg at 05/19/19 2100 .  sodium chloride (OCEAN) 0.65 % nasal spray 4 spray, 4 spray, Each Nare, PRN, Newman Pies, MD .  tiZANidine (ZANAFLEX) tablet 4 mg, 4 mg, Oral, Q8H PRN, Newman Pies, MD, 4 mg at 04/20/19 1554 .  vancomycin (VANCOCIN) 1,250 mg in sodium chloride 0.9 % 250 mL IVPB, 1,250 mg, Intravenous, Q36H, Judith Part, MD, Stopped at 05/19/19 0631   Physical Exam: Awake, Ox3, PERRL, EOMI, FS, Strength 5/5 x4, SILTx4, VFF with mild bitemporal hemianopsia No rhinorrhea on exam this morning Abdominal incision packed on R, healing well on L  Assessment & Plan: 63 y.o. 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 Failed bedside lumbar drain  placement. 8/11 LD placed under fluoro, minimal output, 8/14 LD removed; 8/16 rhinorrhea, LD replaced, 8/19 somnolence, CTH w/ large volume pneumocephalus, LD clamped, 8/21 s/p repeat endonasal skull base reconstruction, 8/25 rpt CTH with resolving pneumocephalus, febrile started on vanc/cefepime  -cont local wound care to R abdominal wound -f/u CSF Cx, NGTD, fever curve appears improved, will continue to trend -continue LD @ 15cc/hr -likely shunt placement next week pending fever curve, CSF Cx, ID recs -Abx per ID -cont qd RFPs, CBCs, pending for today -SCDs/TEDs/SQH  Judith Part  05/20/19 7:55 AM  *Addendum: pt was afebrile overnight but spiked a fever as I was finishing rounds, will get a new MRI today

## 2019-05-20 NOTE — Progress Notes (Signed)
[facility] for Infectious Disease  Date of Admission:  04/15/2019      Total days of antibiotics 6  Day 5 Cefepime  Day 6 Vancomycin   Day 3 Levaquin            ASSESSMENT:  [name] is a 63 y.o. female with bacterial meningitis in the setting of ongoing CSF leak with 3rd lumbar drain in place currently. Her repeat CSF after > 48 hours of antibiotics revealed worsening pleocytosis (850 - 2200 cells, > 90% neuts), glucose < 20, elevated protein.   The pathogen causing her meningitis is unclear but overall her fever curve seems to be decreasing. Repeated blood cultures again are no growth. MRI pending. Would follow her fever curve for timing of re-sampling her CSF.    PLAN:  1. Continue Vancomycin, Levaquin and Cefepime  2. MRI pending 3. Follow micro   Principal Problem:   Acute bacterial meningitis Active Problems:   Diabetes mellitus type II, non insulin dependent (HCC)   AKI (acute kidney injury) ([place])   Pituitary adenoma (HCC)   CSF leak   . amLODipine  10 mg Oral Daily  . atorvastatin  40 mg Oral Daily  . Chlorhexidine Gluconate Cloth  6 each Topical Daily  . docusate sodium  100 mg Oral BID  . feeding supplement (ENSURE ENLIVE)  237 mL Oral BID BM  . Fluorescein Sodium  500 mg Intravenous Once  . gabapentin  300 mg Oral BID  . Gerhardt's butt cream   Topical BID  . heparin injection (subcutaneous)  5,000 Units Subcutaneous Q8H  . insulin aspart  0-20 Units Subcutaneous TID WC  . insulin aspart  0-5 Units Subcutaneous QHS  . iron polysaccharides  150 mg Oral BID  . pantoprazole  40 mg Oral Daily  . senna  1 tablet Oral BID    SUBJECTIVE: No further headaches or nausea. Continues to have no other additions to ROS or concerns that would suggest alternative explanation for fever.   Temp Readings from Last 3 Encounters:  05/20/19 100 F (37.8 C)  04/06/19 (!) 97.2 F (36.2 C) (Oral)  11/11/18 97.8 F (36.6 C) (Oral)   Lab Results   Component Value Date   WBC 6.6 05/20/2019   Lab Results  Component Value Date   CREATININE 1.63 (H) 05/20/2019    Review of Systems: Review of Systems  Constitutional: Positive for chills and fever.  HENT: Negative for sinus pain and sore throat.   Eyes: Negative for photophobia.  Respiratory: Negative for cough and shortness of breath.   Cardiovascular: Negative for chest pain and leg swelling.  Gastrointestinal: Negative for abdominal pain and diarrhea.  Genitourinary: Negative for dysuria.  Musculoskeletal: Negative for back pain and myalgias.  Neurological: Negative for dizziness, focal weakness, seizures and headaches.    No Known Allergies  OBJECTIVE: Vitals:   05/20/19 0900 05/20/19 1000 05/20/19 1100 05/20/19 1300  BP: 126/67 125/69 108/68 123/75  Pulse: 95 94 88 93  Resp: 17 17 15 13   Temp:    100 F (37.8 C)  TempSrc:      SpO2: 98% 100% 96% 97%  Weight:      Height:       Body mass index is 38.25 kg/m.  Physical Exam Constitutional:      Comments: Resting comfortably in bed. Watching TV quietly. No distress.   HENT:     Nose: No congestion or rhinorrhea.  Eyes:  General: No scleral icterus.    Pupils: Pupils are equal, round, and reactive to light.  Cardiovascular:     Rate and Rhythm: Regular rhythm. Tachycardia present.     Pulses: Normal pulses.     Heart sounds: No murmur.  Pulmonary:     Effort: Pulmonary effort is normal.     Breath sounds: Normal breath sounds.     Comments: Off oxygen  Abdominal:     General: There is no distension.     Tenderness: There is no abdominal tenderness.  Musculoskeletal:        General: No swelling or tenderness.  Skin:    General: Skin is warm and dry.     Capillary Refill: Capillary refill takes less than 2 seconds.  Neurological:     Mental Status: She is oriented to person, place, and time.     Sensory: No sensory deficit.     Lab Results Lab Results  Component Value Date   WBC 6.6  05/20/2019   HGB 7.3 (L) 05/20/2019   HCT 24.7 (L) 05/20/2019   MCV 87.9 05/20/2019   PLT 309 05/20/2019    Lab Results  Component Value Date   CREATININE 1.63 (H) 05/20/2019   BUN 11 05/20/2019   NA 134 (L) 05/20/2019   K 3.6 05/20/2019   CL 102 05/20/2019   CO2 20 (L) 05/20/2019    Lab Results  Component Value Date   ALT 25 10/28/2018   AST 41 10/28/2018   ALKPHOS 88 10/28/2018   BILITOT 0.7 10/28/2018     Microbiology: Recent Results (from the past 240 hour(s))  Culture, blood (routine x 2)     Status: None   Collection Time: 05/15/19  8:17 PM   Specimen: BLOOD RIGHT ARM  Result Value Ref Range Status   Specimen Description BLOOD RIGHT ARM  Final   Special Requests   Final    BOTTLES DRAWN AEROBIC ONLY Blood Culture results may not be optimal due to an inadequate volume of blood received in culture bottles   Culture   Final    NO GROWTH 5 DAYS Performed at [facility], [place] [address], [place], [place] [address]    Report Status 05/20/2019 FINAL  Final  Culture, blood (routine x 2)     Status: None   Collection Time: 05/15/19  8:20 PM   Specimen: BLOOD RIGHT HAND  Result Value Ref Range Status   Specimen Description BLOOD RIGHT HAND  Final   Special Requests   Final    BOTTLES DRAWN AEROBIC ONLY Blood Culture results may not be optimal due to an inadequate volume of blood received in culture bottles   Culture   Final    NO GROWTH 5 DAYS Performed at [facility], [place] [address], [place], [place] [address]    Report Status 05/20/2019 FINAL  Final  CSF culture     Status: None   Collection Time: 05/15/19 10:19 PM   Specimen: CSF; Cerebrospinal Fluid  Result Value Ref Range Status   Specimen Description CSF  Final   Special Requests TUBE 2  Final   Gram Stain   Final    CYTOSPIN SMEAR WBC PRESENT,BOTH PMN AND MONONUCLEAR NO ORGANISMS SEEN    Culture   Final    NO GROWTH 3 DAYS Performed at [facility], [place] [address], [place],  [place] [address]    Report Status 05/19/2019 FINAL  Final  Culture, Urine  Status: Abnormal   Collection Time: 05/16/19 12:28 AM   Specimen: Urine, Random  Result Value Ref Range Status   Specimen Description URINE, RANDOM  Final   Special Requests   Final    NONE Performed at [facility], [place] [address] [place], [place] [address]    Culture >=100,000 COLONIES/mL ENTEROBACTER AEROGENES (A)  Final   Report Status 05/17/2019 FINAL  Final   Organism ID, Bacteria ENTEROBACTER AEROGENES (A)  Final      Susceptibility   Enterobacter aerogenes - MIC*    CEFAZOLIN >=64 RESISTANT Resistant     CEFTRIAXONE <=1 SENSITIVE Sensitive     CIPROFLOXACIN <=0.25 SENSITIVE Sensitive     GENTAMICIN <=1 SENSITIVE Sensitive     IMIPENEM 2 SENSITIVE Sensitive     NITROFURANTOIN 128 RESISTANT Resistant     TRIMETH/SULFA <=20 SENSITIVE Sensitive     PIP/TAZO <=4 SENSITIVE Sensitive     * >=100,000 COLONIES/mL ENTEROBACTER AEROGENES  CSF culture with Stat gram stain     Status: None (Preliminary result)   Collection Time: 05/18/19  9:07 AM   Specimen: CSF; Cerebrospinal Fluid  Result Value Ref Range Status   Specimen Description CSF  Final   Special Requests NONE  Final   Gram Stain   Final    WBC PRESENT,BOTH PMN AND MONONUCLEAR NO ORGANISMS SEEN CYTOSPIN SMEAR    Culture   Final    NO GROWTH 2 DAYS Performed at [facility], [address], [place], [place] [address]    Report Status PENDING  Incomplete  Culture, blood (routine x 2)     Status: None (Preliminary result)   Collection Time: 05/19/19  2:49 PM   Specimen: BLOOD  Result Value Ref Range Status   Specimen Description BLOOD RIGHT ANTECUBITAL  Final   Special Requests   Final    BOTTLES DRAWN AEROBIC ONLY Blood Culture adequate volume   Culture   Final    NO GROWTH < 24 HOURS Performed at [facility], [place] [address] [place], [place] [address]    Report Status PENDING  Incomplete  Culture, blood (routine x 2)      Status: None (Preliminary result)   Collection Time: 05/19/19  2:49 PM   Specimen: BLOOD RIGHT ARM  Result Value Ref Range Status   Specimen Description BLOOD RIGHT ARM  Final   Special Requests   Final    BOTTLES DRAWN AEROBIC ONLY Blood Culture adequate volume   Culture   Final    NO GROWTH < 24 HOURS Performed at [facility], [place] [address] [place],  [address]    Report Status PENDING  Incomplete     Janene Madeira, MSN, NP-C [facility] Infectious Disease [email or web address] Pager: [phone] Office: [phone] [place]: [phone]

## 2019-05-21 ENCOUNTER — Inpatient Hospital Stay (HOSPITAL_COMMUNITY): Payer: BC Managed Care – PPO

## 2019-05-21 DIAGNOSIS — N341 Nonspecific urethritis: Secondary | ICD-10-CM

## 2019-05-21 DIAGNOSIS — G960 Cerebrospinal fluid leak: Secondary | ICD-10-CM

## 2019-05-21 LAB — GLUCOSE, CAPILLARY
Glucose-Capillary: 119 mg/dL — ABNORMAL HIGH (ref 70–99)
Glucose-Capillary: 122 mg/dL — ABNORMAL HIGH (ref 70–99)
Glucose-Capillary: 94 mg/dL (ref 70–99)
Glucose-Capillary: 106 mg/dL — ABNORMAL HIGH (ref 70–99)

## 2019-05-21 LAB — CBC
HCT: 26.3 % — ABNORMAL LOW (ref 36.0–46.0)
Hemoglobin: 7.7 g/dL — ABNORMAL LOW (ref 12.0–15.0)
MCH: 25.9 pg — ABNORMAL LOW (ref 26.0–34.0)
MCHC: 29.3 g/dL — ABNORMAL LOW (ref 30.0–36.0)
MCV: 88.6 fL (ref 80.0–100.0)
Platelets: 264 10*3/uL (ref 150–400)
RBC: 2.97 MIL/uL — ABNORMAL LOW (ref 3.87–5.11)
RDW: 15.6 % — ABNORMAL HIGH (ref 11.5–15.5)
WBC: 5.7 10*3/uL (ref 4.0–10.5)
nRBC: 0.4 % — ABNORMAL HIGH (ref 0.0–0.2)

## 2019-05-21 LAB — CSF CULTURE W GRAM STAIN: Culture: NO GROWTH

## 2019-05-21 LAB — RENAL FUNCTION PANEL
Albumin: 2.9 g/dL — ABNORMAL LOW (ref 3.5–5.0)
Anion gap: 14 (ref 5–15)
BUN: 14 mg/dL (ref 8–23)
CO2: 17 mmol/L — ABNORMAL LOW (ref 22–32)
Calcium: 8.7 mg/dL — ABNORMAL LOW (ref 8.9–10.3)
Chloride: 102 mmol/L (ref 98–111)
Creatinine, Ser: 1.74 mg/dL — ABNORMAL HIGH (ref 0.44–1.00)
GFR calc Af Amer: 36 mL/min — ABNORMAL LOW (ref >60)
GFR calc non Af Amer: 31 mL/min — ABNORMAL LOW (ref >60)
Glucose, Bld: 127 mg/dL — ABNORMAL HIGH (ref 70–99)
Phosphorus: 3.2 mg/dL (ref 2.5–4.6)
Potassium: 3.9 mmol/L (ref 3.5–5.1)
Sodium: 133 mmol/L — ABNORMAL LOW (ref 135–145)

## 2019-05-21 MED ORDER — LINEZOLID 600 MG PO TABS
600.0000 mg | ORAL_TABLET | Freq: Two times a day (BID) | ORAL | Status: AC
  Administered 2019-05-21 – 2019-05-27 (×13): 600 mg via ORAL
  Filled 2019-05-21 (×13): qty 1

## 2019-05-21 NOTE — Progress Notes (Signed)
Subjective: Patient reports doing OK  Objective: Vital signs in last 24 hours: Temp:  [99.4 F (37.4 C)-102 F (38.9 C)] 102 F (38.9 C) (09/12 0800) Pulse Rate:  [86-128] 97 (09/12 0900) Resp:  [13-18] 17 (09/12 0900) BP: (87-136)/(52-116) 129/72 (09/12 0900) SpO2:  [91 %-100 %] 97 % (09/12 0900)  Intake/Output from previous day: 09/11 0701 - 09/12 0700 In: 1258.8 [I.V.:1008.8; IV Piggyback:250] Out: 2172 [Urine:1825; Drains:347] Intake/Output this shift: No intake/output data recorded.  Physical Exam: Lumbar drain is working well.    Lab Results: Recent Labs    05/20/19 1123 05/21/19 0355  WBC 6.6 5.7  HGB 7.3* 7.7*  HCT 24.7* 26.3*  PLT 309 264   BMET Recent Labs    05/20/19 0814 05/21/19 0355  NA 134* 133*  K 3.6 3.9  CL 102 102  CO2 20* 17*  GLUCOSE 161* 127*  BUN 11 14  CREATININE 1.63* 1.74*  CALCIUM 8.9 8.7*    Studies/Results: Mr [name] Contrast  Result Date: 05/20/2019 CLINICAL DATA:  63 year old female s/p repeat endoscopic trans-sphenoidal resection of pituitary tumor. Subsequent CSF leak, CSF rhinorrhea and lumbar drain placement. Subsequent placement of abdominal fat graft at the surgical site. Subsequent fever and abnormal CSF consistent with meningitis. Recurrent CSF fistula/rhinorrhea. EXAM: MRI HEAD WITHOUT AND WITH CONTRAST TECHNIQUE: Multiplanar, multiecho pulse sequences of the brain and surrounding structures were obtained without and with intravenous contrast. CONTRAST:  24mL GADAVIST GADOBUTROL 1 MMOL/ML IV SOLN COMPARISON:  Brain MRI 10/29/2018. FINDINGS: Brain: Pituitary findings are described below. Layering bilateral occipital horn intraventricular debris with diffusion restriction (series 5, image 74). This is similar to but increased from the appearance on 10/29/2018. There is similar new layering debris with restricted diffusion in the 4th ventricle on image 64. There is subtle restricted diffusion also along the ependyma of the  left frontal horn on image 83. There is no associated ependymal enhancement, and ventricle size and configuration are stable since February. No transependymal edema. No other restricted diffusion. No midline shift, mass effect, or acute intracranial hemorrhage. Cervicomedullary junction within normal limits. With stable gray scattered and white matter nonspecific signal small white matter T2 and FLAIR hyperintense foci. No cortical encephalomalacia. Negative deep gray nuclei, brainstem and cerebellum. No chronic cerebral blood products. There is abnormal enhancement of the left optic radiation posterior to the chiasm (series 29, image 25). See additional details below. No other abnormal parenchymal enhancement identified. However, there does appear to be mild asymmetric leptomeningeal enhancement along the left cerebellopontine angle on series 29, image 17. Abnormal enhancement identified. No significant pachymeningeal thickening. Vascular: Major intracranial vascular flow voids are stable. The major dural venous sinuses are enhancing and appear to be patent. Skull and upper cervical spine: Negative visible cervical spine. Normal background bone marrow signal. Sinuses/Orbits: Optic chiasm described below. The intraorbital soft tissues remain normal. Paranasal sinuses described below. Mastoids remain clear. Visible internal auditory structures appear normal. Other: Dedicated pituitary imaging. Low T1 high T2 signal material within the sphenoid sinuses, posterior ethmoids and posterior nasal cavity with what appears to be mucosal type hyperenhancement. Several globular foci of adipose are present along the anterior sella turcica and in the posterior left sphenoid as seen on series 15, images 5 and 6. Partially empty sella appearance. The infundibulum is not delineated. There is peripheral enhancing soft tissue within the sella turcica (series 27, image 7) ranging from 3-8 millimeters in thickness (series 28, image 8).  No definite cavernous sinus involvement. These images redemonstrate  patchy enhancement of a 6 millimeter segment of the left optic radiation posterior to the level of the chiasm. This is also inseparable from the hypothalamus. There is suprasellar cistern architectural distortion and the chiasm cannot be delineated. IMPRESSION: 1. Small volume proteinaceous/infectious debris layering in the lateral and 4th ventricles. No evidence of associated ependymitis or transependymal edema. No focal encephalitis. There is mild leptomeningeal enhancement suspected at the base of the brain. 2. Postoperative changes to the sella turcica and sphenoid sinuses. Peripheral soft tissue thickening in the sella turcica with suprasellar cistern architectural distortion. No definite cavernous sinus involvement. 3. A portion of the left optic radiation is enhancing. The infundibulum and optic chiasm are poorly delineated. 4. Fat packing along the anterior sella and in the sphenoid sinus with superimposed moderate to severe paranasal sinus mucosal thickening. Electronically Signed   By: Genevie Ann M.D.   On: 05/20/2019 19:16    Assessment/Plan: MRI negative for infection source.  Continues to have fevers.  Cultures negative to date.  Continue with drain.  Anticipate shunt next week.    LOS: 36 days    Peggyann Shoals, MD 05/21/2019, 9:47 AM

## 2019-05-21 NOTE — Progress Notes (Signed)
[facility]  Date of Admission:  04/15/2019      Total days of antibiotics 7  Day 6 Cefepime  Day 7 Vancomycin   Day 4 Levaquin            ASSESSMENT:  [name] is a 63 y.o. female with NSU/bacterial meningitis in the setting of ongoing CSF leak with 3rd lumbar drain in place currently following adipose patch to prior site of pituitary adenoma resection. Her repeat CSF after > 48 hours of antibiotics revealed worsening pleocytosis (850 - 2200 cells, > 90% neuts), glucose < 20, elevated protein.   The pathogen causing her meningitis is unclear and her fever has been persistent for the majority of this past week despite increasingly broad ABX. Repeated blood cultures again are no growth.  Whenever CSF sampling is repeated, please perform this from a fresh/clean site. Obtaining CSF parameters from an already present drain are likely to show false elevations in WBCs and yield uncertainty of any pathogen isolated as to whether they represent true pathogen vs. colonizers within collecting system.   PLAN:  1. Continue Levaquin and Cefepime for now 2. Given relapsing fever, will change vancomycin to zyvox 600 mg PO BID as peripheral eosinophilia is slightly up. MRI from last PM does show fluid in ethmoid and sphenoid sinuses. Given persistent fever despite extremely broad spectrum ABX, this may be due to a persistent parameningeal focus of infection, so would ask ENT to evaluate for diagnostic and therapeutic aspiration of sinuses. 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 3. Follow micro (remains unrevealing thus far) 4. Check CXR given SOB reported when febrile this AM 5. Repeat blood cx x 2 6. Check BMP daily to follow Cr as she has ranged from 1.3-1.85 over  this past month.   Principal Problem:   Acute bacterial meningitis Active Problems:   Diabetes mellitus type II, non insulin dependent (HCC)   AKI (acute kidney injury) ([place])   Pituitary adenoma (HCC)   CSF leak   . amLODipine  10 mg Oral Daily  . atorvastatin  40 mg Oral Daily  . Chlorhexidine Gluconate Cloth  6 each Topical Daily  . docusate sodium  100 mg Oral BID  . feeding supplement (ENSURE ENLIVE)  237 mL Oral BID BM  . Fluorescein Sodium  500 mg Intravenous Once  . gabapentin  300 mg Oral BID  . Gerhardt's butt cream   Topical BID  . heparin injection (subcutaneous)  5,000 Units Subcutaneous Q8H  . insulin aspart  0-20 Units Subcutaneous TID WC  . insulin aspart  0-5 Units Subcutaneous QHS  . iron polysaccharides  150 mg Oral BID  . pantoprazole  40 mg Oral Daily  . senna  1 tablet Oral BID    SUBJECTIVE: Fever recurred to 102 overnight. She admits to some SOB at time of fever. Appetite remains poor. She remains on bedrest with maximum elevation to HOB at 15 degrees per nursing. MRI of head performed last PM.  Temp Readings from Last 3 Encounters:  05/21/19 (!) 102 F (38.9 C) (Oral)  04/06/19 (!) 97.2 F (36.2 C) (Oral)  11/11/18 97.8 F (36.6 C) (Oral)   Lab Results  Component Value Date   WBC 5.7 05/21/2019   Lab Results  Component Value Date  CREATININE 1.74 (H) 05/21/2019    Review of Systems: Review of Systems  Constitutional: Positive for chills and fever.  HENT: Negative for sinus pain and sore throat.   Eyes: Negative for photophobia.  Respiratory: Positive for shortness of breath. Negative for cough.   Cardiovascular: Negative for chest pain and leg swelling.  Gastrointestinal: Negative for abdominal pain and diarrhea.  Genitourinary: Negative for dysuria.  Musculoskeletal: Negative for back pain and myalgias.  Neurological: Negative for dizziness, focal weakness, seizures and headaches.    No Known Allergies  OBJECTIVE: Vitals:    05/21/19 0900 05/21/19 1000 05/21/19 1100 05/21/19 1200  BP: 129/72 132/72 119/64 111/60  Pulse: 97 98 96 90  Resp: 17 19 18 18   Temp:      TempSrc:      SpO2: 97% 96% 94% 93%  Weight:      Height:       Body mass index is 38.25 kg/m.  Physical Exam Gen: pleasant, NAD, A&Ox 3, lying flat Head: NCAT, no temporal wasting evident EENT: PERRL, EOMI, MMM, adequate dentition Neck: supple, no JVD CV: NRRR, no murmurs evident Pulm: CTA bilaterally, no wheeze or retractions Abd: soft, NTND, +BS MSK: lumbar drain intact with collection bag set to -7 Extrems: no LE edema, 2+ pulses Skin: no rashes, adequate skin turgor Neuro: CN II-XII grossly intact, no focal neurologic deficits appreciated, gait was not assessed, A&Ox 3, occasional slowed speech/word finding  Lab Results Lab Results  Component Value Date   WBC 5.7 05/21/2019   HGB 7.7 (L) 05/21/2019   HCT 26.3 (L) 05/21/2019   MCV 88.6 05/21/2019   PLT 264 05/21/2019    Lab Results  Component Value Date   CREATININE 1.74 (H) 05/21/2019   BUN 14 05/21/2019   NA 133 (L) 05/21/2019   K 3.9 05/21/2019   CL 102 05/21/2019   CO2 17 (L) 05/21/2019    Lab Results  Component Value Date   ALT 25 10/28/2018   AST 41 10/28/2018   ALKPHOS 88 10/28/2018   BILITOT 0.7 10/28/2018     Microbiology: Recent Results (from the past 240 hour(s))  Culture, blood (routine x 2)     Status: None   Collection Time: 05/15/19  8:17 PM   Specimen: BLOOD RIGHT ARM  Result Value Ref Range Status   Specimen Description BLOOD RIGHT ARM  Final   Special Requests   Final    BOTTLES DRAWN AEROBIC ONLY Blood Culture results may not be optimal due to an inadequate volume of blood received in culture bottles   Culture   Final    NO GROWTH 5 DAYS Performed at [facility], [place] [address], [place], [place] [address]    Report Status 05/20/2019 FINAL  Final  Culture, blood (routine x 2)     Status: None   Collection Time: 05/15/19  8:20 PM    Specimen: BLOOD RIGHT HAND  Result Value Ref Range Status   Specimen Description BLOOD RIGHT HAND  Final   Special Requests   Final    BOTTLES DRAWN AEROBIC ONLY Blood Culture results may not be optimal due to an inadequate volume of blood received in culture bottles   Culture   Final    NO GROWTH 5 DAYS Performed at [facility], [place] [address] [place], [place] [address]    Report Status 05/20/2019 FINAL  Final  CSF culture     Status: None   Collection Time: 05/15/19 10:19 PM  Specimen: CSF; Cerebrospinal Fluid  Result Value Ref Range Status   Specimen Description CSF  Final   Special Requests TUBE 2  Final   Gram Stain   Final    CYTOSPIN SMEAR WBC PRESENT,BOTH PMN AND MONONUCLEAR NO ORGANISMS SEEN    Culture   Final    NO GROWTH 3 DAYS Performed at [facility], [address], [place], [place] [address]    Report Status 05/19/2019 FINAL  Final  Culture, Urine     Status: Abnormal   Collection Time: 05/16/19 12:28 AM   Specimen: Urine, Random  Result Value Ref Range Status   Specimen Description URINE, RANDOM  Final   Special Requests   Final    NONE Performed at [facility], [place] [address] [place], [place] [address]    Culture >=100,000 COLONIES/mL ENTEROBACTER AEROGENES (A)  Final   Report Status 05/17/2019 FINAL  Final   Organism ID, Bacteria ENTEROBACTER AEROGENES (A)  Final      Susceptibility   Enterobacter aerogenes - MIC*    CEFAZOLIN >=64 RESISTANT Resistant     CEFTRIAXONE <=1 SENSITIVE Sensitive     CIPROFLOXACIN <=0.25 SENSITIVE Sensitive     GENTAMICIN <=1 SENSITIVE Sensitive     IMIPENEM 2 SENSITIVE Sensitive     NITROFURANTOIN 128 RESISTANT Resistant     TRIMETH/SULFA <=20 SENSITIVE Sensitive     PIP/TAZO <=4 SENSITIVE Sensitive     * >=100,000 COLONIES/mL ENTEROBACTER AEROGENES  CSF culture with Stat gram stain     Status: None   Collection Time: 05/18/19  9:07 AM   Specimen: CSF; Cerebrospinal Fluid  Result Value Ref Range  Status   Specimen Description CSF  Final   Special Requests NONE  Final   Gram Stain   Final    WBC PRESENT,BOTH PMN AND MONONUCLEAR NO ORGANISMS SEEN CYTOSPIN SMEAR    Culture   Final    NO GROWTH 3 DAYS Performed at [facility], [place] [address] [place], [place] [address]    Report Status 05/21/2019 FINAL  Final  Culture, blood (routine x 2)     Status: None (Preliminary result)   Collection Time: 05/19/19  2:49 PM   Specimen: BLOOD  Result Value Ref Range Status   Specimen Description BLOOD RIGHT ANTECUBITAL  Final   Special Requests   Final    BOTTLES DRAWN AEROBIC ONLY Blood Culture adequate volume   Culture   Final    NO GROWTH 2 DAYS Performed at [facility], [place] [address] [place], [place] [address]    Report Status PENDING  Incomplete  Culture, blood (routine x 2)     Status: None (Preliminary result)   Collection Time: 05/19/19  2:49 PM   Specimen: BLOOD RIGHT ARM  Result Value Ref Range Status   Specimen Description BLOOD RIGHT ARM  Final   Special Requests   Final    BOTTLES DRAWN AEROBIC ONLY Blood Culture adequate volume   Culture   Final    NO GROWTH 2 DAYS Performed at [facility], [place] [address] [place], [place] [address]    Report Status PENDING  Incomplete     Catalina Antigua, MD [facility] [place]: [phone]

## 2019-05-21 NOTE — Progress Notes (Signed)
Pt having new bouts of confusion, Pt was AOx4, now only oriented to self. Speaking clearly but not making sense and not able to follow conversation. Dr.Stern notified, no new orders, will continue to monitor.

## 2019-05-22 LAB — RENAL FUNCTION PANEL
Albumin: 2.9 g/dL — ABNORMAL LOW (ref 3.5–5.0)
Anion gap: 10 (ref 5–15)
BUN: 13 mg/dL (ref 8–23)
CO2: 18 mmol/L — ABNORMAL LOW (ref 22–32)
Calcium: 8.8 mg/dL — ABNORMAL LOW (ref 8.9–10.3)
Chloride: 107 mmol/L (ref 98–111)
Creatinine, Ser: 1.55 mg/dL — ABNORMAL HIGH (ref 0.44–1.00)
GFR calc Af Amer: 41 mL/min — ABNORMAL LOW (ref >60)
GFR calc non Af Amer: 36 mL/min — ABNORMAL LOW (ref >60)
Glucose, Bld: 132 mg/dL — ABNORMAL HIGH (ref 70–99)
Phosphorus: 2.9 mg/dL (ref 2.5–4.6)
Potassium: 3.5 mmol/L (ref 3.5–5.1)
Sodium: 135 mmol/L (ref 135–145)

## 2019-05-22 LAB — CBC
HCT: 25.4 % — ABNORMAL LOW (ref 36.0–46.0)
Hemoglobin: 7.7 g/dL — ABNORMAL LOW (ref 12.0–15.0)
MCH: 26.4 pg (ref 26.0–34.0)
MCHC: 30.3 g/dL (ref 30.0–36.0)
MCV: 87 fL (ref 80.0–100.0)
Platelets: 262 10*3/uL (ref 150–400)
RBC: 2.92 MIL/uL — ABNORMAL LOW (ref 3.87–5.11)
RDW: 15.9 % — ABNORMAL HIGH (ref 11.5–15.5)
WBC: 6.8 10*3/uL (ref 4.0–10.5)
nRBC: 0 % (ref 0.0–0.2)

## 2019-05-22 LAB — GLUCOSE, CAPILLARY
Glucose-Capillary: 137 mg/dL — ABNORMAL HIGH (ref 70–99)
Glucose-Capillary: 128 mg/dL — ABNORMAL HIGH (ref 70–99)
Glucose-Capillary: 149 mg/dL — ABNORMAL HIGH (ref 70–99)
Glucose-Capillary: 99 mg/dL (ref 70–99)

## 2019-05-22 NOTE — Progress Notes (Signed)
Subjective: Patient reports oriented to name, place  Objective: Vital signs in last 24 hours: Temp:  [99.2 F (37.3 C)-100.5 F (38.1 C)] 99.3 F (37.4 C) (09/13 0800) Pulse Rate:  [80-98] 88 (09/13 0800) Resp:  [13-19] 15 (09/13 0800) BP: (87-135)/(60-83) 128/74 (09/13 0800) SpO2:  [78 %-100 %] 100 % (09/13 0800)  Intake/Output from previous day: 09/12 0701 - 09/13 0700 In: [id number] [I.V.:2248.1; IV Piggyback:450] Out: 2631 [Urine:2310; Drains:321] Intake/Output this shift: Total I/O In: 263.1 [I.V.:163.1; IV Piggyback:100] Out: 18 [Drains:18]  Physical Exam: Patient was confused yesterday, but better this morning.  MAEW and follows commands all four extremities.  Lumbar drain working well.   Lab Results: Recent Labs    05/21/19 0355 05/22/19 0527  WBC 5.7 6.8  HGB 7.7* 7.7*  HCT 26.3* 25.4*  PLT 264 262   BMET Recent Labs    05/21/19 0355 05/22/19 0527  NA 133* 135  K 3.9 3.5  CL 102 107  CO2 17* 18*  GLUCOSE 127* 132*  BUN 14 13  CREATININE 1.74* 1.55*  CALCIUM 8.7* 8.8*    Studies/Results: Mr Jeri Cos Wo Contrast  Result Date: 05/20/2019 CLINICAL DATA:  63 year old female s/p repeat endoscopic trans-sphenoidal resection of pituitary tumor. Subsequent CSF leak, CSF rhinorrhea and lumbar drain placement. Subsequent placement of abdominal fat graft at the surgical site. Subsequent fever and abnormal CSF consistent with meningitis. Recurrent CSF fistula/rhinorrhea. EXAM: MRI HEAD WITHOUT AND WITH CONTRAST TECHNIQUE: Multiplanar, multiecho pulse sequences of the brain and surrounding structures were obtained without and with intravenous contrast. CONTRAST:  11mL GADAVIST GADOBUTROL 1 MMOL/ML IV SOLN COMPARISON:  Brain MRI 10/29/2018. FINDINGS: Brain: Pituitary findings are described below. Layering bilateral occipital horn intraventricular debris with diffusion restriction (series 5, image 74). This is similar to but increased from the appearance on 10/29/2018. There  is similar new layering debris with restricted diffusion in the 4th ventricle on image 64. There is subtle restricted diffusion also along the ependyma of the left frontal horn on image 83. There is no associated ependymal enhancement, and ventricle size and configuration are stable since February. No transependymal edema. No other restricted diffusion. No midline shift, mass effect, or acute intracranial hemorrhage. Cervicomedullary junction within normal limits. With stable gray scattered and white matter nonspecific signal small white matter T2 and FLAIR hyperintense foci. No cortical encephalomalacia. Negative deep gray nuclei, brainstem and cerebellum. No chronic cerebral blood products. There is abnormal enhancement of the left optic radiation posterior to the chiasm (series 29, image 25). See additional details below. No other abnormal parenchymal enhancement identified. However, there does appear to be mild asymmetric leptomeningeal enhancement along the left cerebellopontine angle on series 29, image 17. Abnormal enhancement identified. No significant pachymeningeal thickening. Vascular: Major intracranial vascular flow voids are stable. The major dural venous sinuses are enhancing and appear to be patent. Skull and upper cervical spine: Negative visible cervical spine. Normal background bone marrow signal. Sinuses/Orbits: Optic chiasm described below. The intraorbital soft tissues remain normal. Paranasal sinuses described below. Mastoids remain clear. Visible internal auditory structures appear normal. Other: Dedicated pituitary imaging. Low T1 high T2 signal material within the sphenoid sinuses, posterior ethmoids and posterior nasal cavity with what appears to be mucosal type hyperenhancement. Several globular foci of adipose are present along the anterior sella turcica and in the posterior left sphenoid as seen on series 15, images 5 and 6. Partially empty sella appearance. The infundibulum is not  delineated. There is peripheral enhancing soft tissue within the sella  turcica (series 27, image 7) ranging from 3-8 millimeters in thickness (series 28, image 8). No definite cavernous sinus involvement. These images redemonstrate patchy enhancement of a 6 millimeter segment of the left optic radiation posterior to the level of the chiasm. This is also inseparable from the hypothalamus. There is suprasellar cistern architectural distortion and the chiasm cannot be delineated. IMPRESSION: 1. Small volume proteinaceous/infectious debris layering in the lateral and 4th ventricles. No evidence of associated ependymitis or transependymal edema. No focal encephalitis. There is mild leptomeningeal enhancement suspected at the base of the brain. 2. Postoperative changes to the sella turcica and sphenoid sinuses. Peripheral soft tissue thickening in the sella turcica with suprasellar cistern architectural distortion. No definite cavernous sinus involvement. 3. A portion of the left optic radiation is enhancing. The infundibulum and optic chiasm are poorly delineated. 4. Fat packing along the anterior sella and in the sphenoid sinus with superimposed moderate to severe paranasal sinus mucosal thickening. Electronically Signed   By: Genevie Ann M.D.   On: 05/20/2019 19:16   Dg Chest Port 1 View  Result Date: 05/21/2019 CLINICAL DATA:  Fever.  Shortness of breath. EXAM: PORTABLE CHEST 1 VIEW COMPARISON:  05/15/2019 FINDINGS: The heart size and mediastinal contours are within normal limits. Aortic atherosclerosis. Both lungs are clear. The visualized skeletal structures are unremarkable. IMPRESSION: No active disease. Electronically Signed   By: Marlaine Hind M.D.   On: 05/21/2019 15:09    Assessment/Plan: Patient is stable.  Continue lumbar drain.  Still running low grade temps.    LOS: 37 days    Peggyann Shoals, MD 05/22/2019, 9:51 AM

## 2019-05-23 DIAGNOSIS — R509 Fever, unspecified: Secondary | ICD-10-CM

## 2019-05-23 LAB — CBC
HCT: 26.5 % — ABNORMAL LOW (ref 36.0–46.0)
Hemoglobin: 7.8 g/dL — ABNORMAL LOW (ref 12.0–15.0)
MCH: 26.1 pg (ref 26.0–34.0)
MCHC: 29.4 g/dL — ABNORMAL LOW (ref 30.0–36.0)
MCV: 88.6 fL (ref 80.0–100.0)
Platelets: 264 10*3/uL (ref 150–400)
RBC: 2.99 MIL/uL — ABNORMAL LOW (ref 3.87–5.11)
RDW: 15.9 % — ABNORMAL HIGH (ref 11.5–15.5)
WBC: 6.2 10*3/uL (ref 4.0–10.5)
nRBC: 0 % (ref 0.0–0.2)

## 2019-05-23 LAB — RENAL FUNCTION PANEL
Albumin: 2.9 g/dL — ABNORMAL LOW (ref 3.5–5.0)
Anion gap: 12 (ref 5–15)
BUN: 12 mg/dL (ref 8–23)
CO2: 17 mmol/L — ABNORMAL LOW (ref 22–32)
Calcium: 8.7 mg/dL — ABNORMAL LOW (ref 8.9–10.3)
Chloride: 107 mmol/L (ref 98–111)
Creatinine, Ser: 1.46 mg/dL — ABNORMAL HIGH (ref 0.44–1.00)
GFR calc Af Amer: 45 mL/min — ABNORMAL LOW (ref >60)
GFR calc non Af Amer: 38 mL/min — ABNORMAL LOW (ref >60)
Glucose, Bld: 113 mg/dL — ABNORMAL HIGH (ref 70–99)
Phosphorus: 2.8 mg/dL (ref 2.5–4.6)
Potassium: 3.8 mmol/L (ref 3.5–5.1)
Sodium: 136 mmol/L (ref 135–145)

## 2019-05-23 LAB — GLUCOSE, CAPILLARY
Glucose-Capillary: 112 mg/dL — ABNORMAL HIGH (ref 70–99)
Glucose-Capillary: 135 mg/dL — ABNORMAL HIGH (ref 70–99)
Glucose-Capillary: 171 mg/dL — ABNORMAL HIGH (ref 70–99)
Glucose-Capillary: 128 mg/dL — ABNORMAL HIGH (ref 70–99)

## 2019-05-23 NOTE — Progress Notes (Signed)
Pharmacy Antibiotic Note  [name] is a 63 y.o. female admitted on 04/15/2019 status post second CSF leak revision that included lumbar drain placement andendonasal repair and patch.  ID service is currently following, and pharmacy has been consulted for cefepime dosing empirically for meningitis.   Patient's fever trend is improving; she is currently afebrile. WBC are WNL. Renal function is improving with Scr down at 1.46, CrCl 50 ml/min. Vancomycin was changed to Linezolid on 9/12 per ID for increase in eosinophilia.   Plan: - Continue Cefepime 2 g IV Q12h - Continue Linezolid 600 mg PO Q12h - Continue Levofloxacin 750 mg IV Q48h - Monitor cultures, renal function, and clinical progression  Height: 5\' 6"  (167.6 cm) Weight: 236 lb 15.9 oz (107.5 kg) IBW/kg (Calculated) : 59.3  Temp (24hrs), Avg:99.4 F (37.4 C), Min:98.2 F (36.8 C), Max:100.3 F (37.9 C)  Recent Labs  Lab 05/19/19 0658 05/20/19 0814 05/20/19 1123 05/21/19 0355 05/22/19 0527 05/23/19 0225  WBC 11.8* 7.8 6.6 5.7 6.8 6.2  CREATININE 1.55* 1.63*  --  1.74* 1.55* 1.46*  VANCORANDOM  --   --  17  --   --   --     Estimated Creatinine Clearance: 50.2 mL/min (A) (by C-G formula based on SCr of 1.46 mg/dL (H)).    No Known Allergies  Antimicrobials this admission: Vancomycin 9/7 >> 9/12 Cefepime 9/7 >>  Levofloxacin 9/9 >> Linezolid 9/12 >>  Dose adjustments this admission: N/A  Microbiology results: 9/12 BCx: ngtd 9/10 BCx: ngtd 9/9 CSF: wbc, PMN and mononuclear 9/6 BCx: ng-final 9/6 CSF: ng-final 9/7 UCx: enterobacter aerogenes (R: cefazolin, macrobid) 8/26 BCx: negative 8/26 CSF: no growth 8/21 wound Cx: Enterobacter aer + citrobacter (both cefazolin res) 8/20 40k only Enterobacter- S to Cipro, Gent, Imip, and Bactrim; R-Zosyn, Ancef; I-CTX, Nitrof (no tx needed) 8/16 CSF: negative 8/9 BCx: negative  Richardine Service, PharmD PGY1 Pharmacy Resident Phone: [phone] 05/23/2019  1:10  PM  Please check [email or web address] for unit-specific pharmacy phone numbers.

## 2019-05-23 NOTE — Progress Notes (Signed)
Neurosurgery Service Progress Note  Subjective: No acute events overnight, no drainage overnight  Objective: Vitals:   05/23/19 0300 05/23/19 0400 05/23/19 0500 05/23/19 0600  BP: 134/72 131/77 124/74 124/70  Pulse: 86 88 87 89  Resp: 17 15 16 17   Temp:  99.8 F (37.7 C)    TempSrc:  Oral    SpO2: 100% 100% 100% 100%  Weight:      Height:       Temp (24hrs), Avg:99.3 F (37.4 C), Min:98.2 F (36.8 C), Max:100.3 F (37.9 C)  CBC Latest Ref Rng & Units 05/23/2019 05/22/2019 05/21/2019  WBC 4.0 - 10.5 K/uL 6.2 6.8 5.7  Hemoglobin 12.0 - 15.0 g/dL 7.8(L) 7.7(L) 7.7(L)  Hematocrit 36.0 - 46.0 % 26.5(L) 25.4(L) 26.3(L)  Platelets 150 - 400 K/uL 264 262 264   BMP Latest Ref Rng & Units 05/23/2019 05/22/2019 05/21/2019  Glucose 70 - 99 mg/dL 113(H) 132(H) 127(H)  BUN 8 - 23 mg/dL 12 13 14   Creatinine 0.44 - 1.00 mg/dL 1.46(H) 1.55(H) 1.74(H)  Sodium 135 - 145 mmol/L 136 135 133(L)  Potassium 3.5 - 5.1 mmol/L 3.8 3.5 3.9  Chloride 98 - 111 mmol/L 107 107 102  CO2 22 - 32 mmol/L 17(L) 18(L) 17(L)  Calcium 8.9 - 10.3 mg/dL 8.7(L) 8.8(L) 8.7(L)    Intake/Output Summary (Last 24 hours) at 05/23/2019 0738 Last data filed at 05/23/2019 0000 Gross per 24 hour  Intake 1842.73 ml  Output 1721 ml  Net 121.73 ml    Current Facility-Administered Medications:  .  0.9 %  sodium chloride infusion, , Intravenous, PRN, Newman Pies, MD, Last Rate: 10 mL/hr at 05/23/19 0000 .  0.9 %  sodium chloride infusion, , Intravenous, Continuous, Newman Pies, MD, Last Rate: 75 mL/hr at 05/23/19 0000 .  acetaminophen (TYLENOL) tablet 650 mg, 650 mg, Oral, Q4H PRN, 650 mg at 05/22/19 1718 **OR** acetaminophen (TYLENOL) suppository 650 mg, 650 mg, Rectal, Q4H PRN, Newman Pies, MD, 650 mg at 05/16/19 0746 .  alum & mag hydroxide-simeth (MAALOX/MYLANTA) 200-200-20 MG/5ML suspension 30 mL, 30 mL, Oral, Q6H PRN, Newman Pies, MD, 30 mL at 05/05/19 1523 .  amLODipine (NORVASC) tablet 10 mg, 10 mg,  Oral, Daily, Newman Pies, MD, 10 mg at 05/22/19 0952 .  atorvastatin (LIPITOR) tablet 40 mg, 40 mg, Oral, Daily, Newman Pies, MD, 40 mg at 05/22/19 1718 .  ceFEPIme (MAXIPIME) 2 g in sodium chloride 0.9 % 100 mL IVPB, 2 g, Intravenous, Q12H, Bergman, Meghan D, NP, Last Rate: 200 mL/hr at 05/23/19 0556, 2 g at 05/23/19 0556 .  Chlorhexidine Gluconate Cloth 2 % PADS 6 each, 6 each, Topical, Daily, Newman Pies, MD, 6 each at 05/21/19 1455 .  docusate sodium (COLACE) capsule 100 mg, 100 mg, Oral, BID, Newman Pies, MD, 100 mg at 05/22/19 [id number] .  feeding supplement (ENSURE ENLIVE) (ENSURE ENLIVE) liquid 237 mL, 237 mL, Oral, BID BM, Newman Pies, MD, 237 mL at 05/22/19 0952 .  Fluorescein Sodium 10 % injection 500 mg, 500 mg, Intravenous, Once, Newman Pies, MD .  gabapentin (NEURONTIN) capsule 300 mg, 300 mg, Oral, BID, Newman Pies, MD, 300 mg at 05/22/19 2332 .  Gerhardt's butt cream, , Topical, BID, Newman Pies, MD .  heparin injection 5,000 Units, 5,000 Units, Subcutaneous, Q8H, Judith Part, MD, 5,000 Units at 05/23/19 0557 .  HYDROcodone-acetaminophen (NORCO/VICODIN) 5-325 MG per tablet 1 tablet, 1 tablet, Oral, Q4H PRN, Newman Pies, MD, 1 tablet at 05/19/19 0236 .  HYDROmorphone (DILAUDID) injection 0.5 mg, 0.5  mg, Intravenous, Q3H PRN, Newman Pies, MD, 0.5 mg at 04/26/19 0939 .  insulin aspart (novoLOG) injection 0-20 Units, 0-20 Units, Subcutaneous, TID WC, Newman Pies, MD, 3 Units at 05/22/19 1718 .  insulin aspart (novoLOG) injection 0-5 Units, 0-5 Units, Subcutaneous, QHS, Newman Pies, MD, 2 Units at 05/16/19 2126 .  iron polysaccharides (NIFEREX) capsule 150 mg, 150 mg, Oral, BID, Newman Pies, MD, 150 mg at 05/22/19 2331 .  labetalol (NORMODYNE) injection 10-40 mg, 10-40 mg, Intravenous, Q10 min PRN, Newman Pies, MD, 20 mg at 05/20/19 0310 .  levofloxacin (LEVAQUIN) IVPB 750 mg, 750 mg, Intravenous, Q48H, Judith Part, MD, Stopped at 05/22/19 1342 .  lidocaine (PF) (XYLOCAINE) 1 % injection, , , PRN, Jacqulynn Cadet, MD, 10 mL at 04/19/19 1436 .  linezolid (ZYVOX) tablet 600 mg, 600 mg, Oral, Q12H, Powers, Evern Core, MD, 600 mg at 05/22/19 2332 .  lip balm (CARMEX) ointment, , Topical, PRN, Judith Part, MD .  ondansetron (ZOFRAN) tablet 4 mg, 4 mg, Oral, Q4H PRN, 4 mg at 04/30/19 1043 **OR** ondansetron (ZOFRAN) injection 4 mg, 4 mg, Intravenous, Q4H PRN, Newman Pies, MD, 4 mg at 05/16/19 0910 .  ondansetron (ZOFRAN) tablet 4 mg, 4 mg, Oral, Q6H PRN **OR** ondansetron (ZOFRAN) injection 4 mg, 4 mg, Intravenous, Q6H PRN, Newman Pies, MD, 4 mg at 05/05/19 0941 .  pantoprazole (PROTONIX) EC tablet 40 mg, 40 mg, Oral, Daily, Newman Pies, MD, 40 mg at 05/22/19 0952 .  polyethylene glycol (MIRALAX / GLYCOLAX) packet 17 g, 17 g, Oral, Daily PRN, Newman Pies, MD .  promethazine [facility]) injection 12.5-25 mg, 12.5-25 mg, Intravenous, Q4H PRN, Newman Pies, MD, 25 mg at 05/15/19 2239 .  promethazine (PHENERGAN) tablet 12.5-25 mg, 12.5-25 mg, Oral, Q4H PRN, Newman Pies, MD .  senna [facility]) tablet 8.6 mg, 1 tablet, Oral, BID, Newman Pies, MD, 8.6 mg at 05/22/19 0952 .  sodium chloride (OCEAN) 0.65 % nasal spray 4 spray, 4 spray, Each Nare, PRN, Newman Pies, MD .  tiZANidine (ZANAFLEX) tablet 4 mg, 4 mg, Oral, Q8H PRN, Newman Pies, MD, 4 mg at 04/20/19 1554   Physical Exam: Awake, Ox3, PERRL, EOMI, FS, Strength 5/5 x4, SILTx4, VFF with mild bitemporal hemianopsia No rhinorrhea on exam this morning Abdominal incision packed on R, healing well on L  Assessment & Plan: 63 y.o. 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 Failed bedside lumbar drain placement. 8/11 LD placed under fluoro,  minimal output, 8/14 LD removed; 8/16 rhinorrhea, LD replaced, 8/19 somnolence, CTH w/ large volume pneumocephalus, LD clamped, 8/21 s/p repeat endonasal skull base reconstruction, 8/25 rpt CTH with resolving pneumocephalus, febrile started on vanc/cefepime  -cont local wound care to R abdominal wound -MRI reviewed, shows diffusion restricting material layering in the ventricles that does not look like blood products, likely ventriculitis; now day 7 of ABx -continue LD @ 15cc/hr -likely shunt placement later this week or early next week pending fever curve, CSF Cx, ID recs -cont qd RFPs, CBCs -SCDs/TEDs/SQH  Judith Part  05/23/19 7:38 AM

## 2019-05-23 NOTE — Progress Notes (Signed)
[facility]  Date of Admission:  04/15/2019     Total days of antibiotics 9         ASSESSMENT/PLAN  [name] has had continued low grade temperatures with overall improving fever curve in the past 24 hours in the setting of lumbar drain placement s/p adipose patch for ongoing CSF leak complicated by possibly acute bacterial meningitis. Source of fever remains unclear at present although cannot rule out bacterial meningitis given CSF or lumbar drain. Clinically there is no clear evidence of bacterial meningitis with headache, occipatial tenderness or rigidity. CSF in lumbar drain is colorless with no current drainage on evaluation. Microbiology remains unrevealing with no growth in multiple blood cultures or CSF culture. [name] has no other pain or clear source of infection at present. Agree with previous recommendation of re-collection of CSF for evaluation from outside of drain. May also consider ENT evaluation of sinuses if fevers continue. Will continue broad spectrum coverage for the next 24 hours and monitor fever curve. Neurosurgery with plans for shunt placement pending fever curve and CSF cultures.  1. Continue linezolid, cefepime and levofloxacin. 2. Monitor fever curve and culture results.  3. Lumbar drain per neurosurgery.    Principal Problem:   Fever Active Problems:   Acute bacterial meningitis   Diabetes mellitus type II, non insulin dependent (HCC)   AKI (acute kidney injury) ([place])   Pituitary adenoma (HCC)   CSF leak   . amLODipine  10 mg Oral Daily  . atorvastatin  40 mg Oral Daily  . Chlorhexidine Gluconate Cloth  6 each Topical Daily  . docusate sodium  100 mg Oral BID  . feeding supplement (ENSURE ENLIVE)  237 mL Oral BID BM  . Fluorescein Sodium  500 mg Intravenous Once  . gabapentin  300 mg Oral BID  . Gerhardt's butt cream   Topical BID  . heparin injection (subcutaneous)  5,000 Units Subcutaneous Q8H  . insulin aspart  0-20  Units Subcutaneous TID WC  . insulin aspart  0-5 Units Subcutaneous QHS  . iron polysaccharides  150 mg Oral BID  . linezolid  600 mg Oral Q12H  . pantoprazole  40 mg Oral Daily  . senna  1 tablet Oral BID    SUBJECTIVE:  Max temperature of 100.3 in the last 24 hours with no leukocytosis or acute events overnight. Platelet level of 264. Denies headache, neck pain/stiffness, or changes in vision. Feeling okay today.   No Known Allergies   Review of Systems: Review of Systems  Constitutional: Negative for chills, fever and weight loss.  Respiratory: Negative for cough, shortness of breath and wheezing.   Cardiovascular: Negative for chest pain and leg swelling.  Gastrointestinal: Negative for abdominal pain, constipation, diarrhea, nausea and vomiting.  Skin: Negative for rash.    OBJECTIVE: Vitals:   05/23/19 1000 05/23/19 1100 05/23/19 1200 05/23/19 1234  BP: 117/62 97/68 (!) 104/57   Pulse: 86 82 87   Resp: 17 17 13    Temp:    98.8 F (37.1 C)  TempSrc:    Oral  SpO2: 97% 100% 100%   Weight:      Height:       Body mass index is 38.25 kg/m.  Physical Exam Constitutional:      General: [name] is not in acute distress.    Appearance: [name] is well-developed.     Comments: Lying in bed; pleasant.   Neck:     Musculoskeletal: Normal range of  motion and neck supple. No neck rigidity or muscular tenderness.  Cardiovascular:     Rate and Rhythm: Normal rate and regular rhythm.     Heart sounds: Normal heart sounds.  Pulmonary:     Effort: Pulmonary effort is normal.     Breath sounds: Normal breath sounds.  Musculoskeletal:     Comments: Lumbar drain patent with clear CSF in bag and no present collection in chamber.   Lymphadenopathy:     Cervical: No cervical adenopathy.  Skin:    General: Skin is warm and dry.  Neurological:     Mental Status: [name] is alert and oriented to person, place, and time.  Psychiatric:        Behavior: Behavior normal.        Thought  Content: Thought content normal.        Judgment: Judgment normal.     Lab Results Lab Results  Component Value Date   WBC 6.2 05/23/2019   HGB 7.8 (L) 05/23/2019   HCT 26.5 (L) 05/23/2019   MCV 88.6 05/23/2019   PLT 264 05/23/2019    Lab Results  Component Value Date   CREATININE 1.46 (H) 05/23/2019   BUN 12 05/23/2019   NA 136 05/23/2019   K 3.8 05/23/2019   CL 107 05/23/2019   CO2 17 (L) 05/23/2019    Lab Results  Component Value Date   ALT 25 10/28/2018   AST 41 10/28/2018   ALKPHOS 88 10/28/2018   BILITOT 0.7 10/28/2018     Microbiology: Recent Results (from the past 240 hour(s))  Culture, blood (routine x 2)     Status: None   Collection Time: 05/15/19  8:17 PM   Specimen: BLOOD RIGHT ARM  Result Value Ref Range Status   Specimen Description BLOOD RIGHT ARM  Final   Special Requests   Final    BOTTLES DRAWN AEROBIC ONLY Blood Culture results may not be optimal due to an inadequate volume of blood received in culture bottles   Culture   Final    NO GROWTH 5 DAYS Performed at [facility], [place] [address], [place], [place] [address]    Report Status 05/20/2019 FINAL  Final  Culture, blood (routine x 2)     Status: None   Collection Time: 05/15/19  8:20 PM   Specimen: BLOOD RIGHT HAND  Result Value Ref Range Status   Specimen Description BLOOD RIGHT HAND  Final   Special Requests   Final    BOTTLES DRAWN AEROBIC ONLY Blood Culture results may not be optimal due to an inadequate volume of blood received in culture bottles   Culture   Final    NO GROWTH 5 DAYS Performed at [facility], [place] [address] [place], [place] [address]    Report Status 05/20/2019 FINAL  Final  CSF culture     Status: None   Collection Time: 05/15/19 10:19 PM   Specimen: CSF; Cerebrospinal Fluid  Result Value Ref Range Status   Specimen Description CSF  Final   Special Requests TUBE 2  Final   Gram Stain   Final    CYTOSPIN SMEAR WBC PRESENT,BOTH PMN AND  MONONUCLEAR NO ORGANISMS SEEN    Culture   Final    NO GROWTH 3 DAYS Performed at [facility], [place] [address], [place], [place] [address]    Report Status 05/19/2019 FINAL  Final  Culture, Urine     Status: Abnormal   Collection Time: 05/16/19  12:28 AM   Specimen: Urine, Random  Result Value Ref Range Status   Specimen Description URINE, RANDOM  Final   Special Requests   Final    NONE Performed at [facility], [place] [address], [place], [place] [address]    Culture >=100,000 COLONIES/mL ENTEROBACTER AEROGENES (A)  Final   Report Status 05/17/2019 FINAL  Final   Organism ID, Bacteria ENTEROBACTER AEROGENES (A)  Final      Susceptibility   Enterobacter aerogenes - MIC*    CEFAZOLIN >=64 RESISTANT Resistant     CEFTRIAXONE <=1 SENSITIVE Sensitive     CIPROFLOXACIN <=0.25 SENSITIVE Sensitive     GENTAMICIN <=1 SENSITIVE Sensitive     IMIPENEM 2 SENSITIVE Sensitive     NITROFURANTOIN 128 RESISTANT Resistant     TRIMETH/SULFA <=20 SENSITIVE Sensitive     PIP/TAZO <=4 SENSITIVE Sensitive     * >=100,000 COLONIES/mL ENTEROBACTER AEROGENES  CSF culture with Stat gram stain     Status: None   Collection Time: 05/18/19  9:07 AM   Specimen: CSF; Cerebrospinal Fluid  Result Value Ref Range Status   Specimen Description CSF  Final   Special Requests NONE  Final   Gram Stain   Final    WBC PRESENT,BOTH PMN AND MONONUCLEAR NO ORGANISMS SEEN CYTOSPIN SMEAR    Culture   Final    NO GROWTH 3 DAYS Performed at [facility], [place] [address], [place], [place] [address]    Report Status 05/21/2019 FINAL  Final  Culture, blood (routine x 2)     Status: None (Preliminary result)   Collection Time: 05/19/19  2:49 PM   Specimen: BLOOD  Result Value Ref Range Status   Specimen Description BLOOD RIGHT ANTECUBITAL  Final   Special Requests   Final    BOTTLES DRAWN AEROBIC ONLY Blood Culture adequate volume   Culture   Final    NO GROWTH 4 DAYS Performed at [facility], [place] [address], [place], [place] [address]    Report Status PENDING  Incomplete  Culture, blood (routine x 2)     Status: None (Preliminary result)   Collection Time: 05/19/19  2:49 PM   Specimen: BLOOD RIGHT ARM  Result Value Ref Range Status   Specimen Description BLOOD RIGHT ARM  Final   Special Requests   Final    BOTTLES DRAWN AEROBIC ONLY Blood Culture adequate volume   Culture   Final    NO GROWTH 4 DAYS Performed at [facility], [place] [address], [place], [place] [address]    Report Status PENDING  Incomplete  Culture, blood (routine x 2)     Status: None (Preliminary result)   Collection Time: 05/21/19  3:30 PM   Specimen: BLOOD LEFT HAND  Result Value Ref Range Status   Specimen Description BLOOD LEFT HAND  Final   Special Requests   Final    AEROBIC BOTTLE ONLY Blood Culture results may not be optimal due to an inadequate volume of blood received in culture bottles   Culture   Final    NO GROWTH 2 DAYS Performed at [facility], [place] [address] [place], [place] [address]    Report Status PENDING  Incomplete  Culture, blood (routine x 2)     Status: None (Preliminary result)   Collection Time: 05/21/19  3:35 PM   Specimen: BLOOD LEFT WRIST  Result Value Ref Range Status   Specimen Description BLOOD LEFT WRIST  Final   Special Requests   Final    AEROBIC BOTTLE ONLY Blood Culture results may not be optimal due to an inadequate volume of blood received in culture bottles   Culture   Final    NO GROWTH 2 DAYS Performed at [facility], [place] [address], [place], [place] [address]    Report Status PENDING  Incomplete     Terri Piedra, [place] [facility] [phone] Pager  05/23/2019  2:08 PM

## 2019-05-24 LAB — CBC
HCT: 24.5 % — ABNORMAL LOW (ref 36.0–46.0)
Hemoglobin: 7.6 g/dL — ABNORMAL LOW (ref 12.0–15.0)
MCH: 26.7 pg (ref 26.0–34.0)
MCHC: 31 g/dL (ref 30.0–36.0)
MCV: 86 fL (ref 80.0–100.0)
Platelets: 252 10*3/uL (ref 150–400)
RBC: 2.85 MIL/uL — ABNORMAL LOW (ref 3.87–5.11)
RDW: 16.1 % — ABNORMAL HIGH (ref 11.5–15.5)
WBC: 6.4 10*3/uL (ref 4.0–10.5)
nRBC: 0 % (ref 0.0–0.2)

## 2019-05-24 LAB — RENAL FUNCTION PANEL
Albumin: 3 g/dL — ABNORMAL LOW (ref 3.5–5.0)
Anion gap: 11 (ref 5–15)
BUN: 11 mg/dL (ref 8–23)
CO2: 18 mmol/L — ABNORMAL LOW (ref 22–32)
Calcium: 9.1 mg/dL (ref 8.9–10.3)
Chloride: 107 mmol/L (ref 98–111)
Creatinine, Ser: 1.41 mg/dL — ABNORMAL HIGH (ref 0.44–1.00)
GFR calc Af Amer: 46 mL/min — ABNORMAL LOW (ref >60)
GFR calc non Af Amer: 40 mL/min — ABNORMAL LOW (ref >60)
Glucose, Bld: 141 mg/dL — ABNORMAL HIGH (ref 70–99)
Phosphorus: 2.3 mg/dL — ABNORMAL LOW (ref 2.5–4.6)
Potassium: 3.7 mmol/L (ref 3.5–5.1)
Sodium: 136 mmol/L (ref 135–145)

## 2019-05-24 LAB — GLUCOSE, CAPILLARY
Glucose-Capillary: 126 mg/dL — ABNORMAL HIGH (ref 70–99)
Glucose-Capillary: 162 mg/dL — ABNORMAL HIGH (ref 70–99)
Glucose-Capillary: 177 mg/dL — ABNORMAL HIGH (ref 70–99)
Glucose-Capillary: 112 mg/dL — ABNORMAL HIGH (ref 70–99)

## 2019-05-24 LAB — CULTURE, BLOOD (ROUTINE X 2)
Culture: NO GROWTH
Culture: NO GROWTH
Special Requests: ADEQUATE
Special Requests: ADEQUATE

## 2019-05-24 MED ORDER — ENSURE ENLIVE PO LIQD
237.0000 mL | Freq: Three times a day (TID) | ORAL | Status: DC
  Administered 2019-05-24 – 2019-06-11 (×30): 237 mL via ORAL

## 2019-05-24 NOTE — Progress Notes (Signed)
[facility]  Date of Admission:  04/15/2019     Total days of antibiotics 9         ASSESSMENT/PLAN  Ms. Kaaihue appears to have defervesced over the past 48 hours and on Day 9 of antimicrobial therapy. Lumbar drain with no output overnight and neurosurgery has no clamped with continual evaluation for need of shunt insertion. Clinically she remains without evidence acute meningitis and I am not fully convinced additional antibiotic therapy is warranted. After discussion with Dr. Prince Rome, will continue antibiotics for 3 more days to ensure resolution. CSF drainage remains clear.  No evidence of thrombocytopenia with platelet level of 252.   PLAN:  1. Continue levofloxacin, linezolid, and cefepime through 9/18. 2. Lumbar drain per neurosurgery. 3. Continue to monitor culture results. 4. Monitor for thrombocytopenia while on Linezolid.    Principal Problem:   Fever Active Problems:   Acute bacterial meningitis   Diabetes mellitus type II, non insulin dependent (HCC)   AKI (acute kidney injury) ([place])   Pituitary adenoma (HCC)   CSF leak   . amLODipine  10 mg Oral Daily  . atorvastatin  40 mg Oral Daily  . Chlorhexidine Gluconate Cloth  6 each Topical Daily  . docusate sodium  100 mg Oral BID  . feeding supplement (ENSURE ENLIVE)  237 mL Oral BID BM  . Fluorescein Sodium  500 mg Intravenous Once  . gabapentin  300 mg Oral BID  . Gerhardt's butt cream   Topical BID  . heparin injection (subcutaneous)  5,000 Units Subcutaneous Q8H  . insulin aspart  0-20 Units Subcutaneous TID WC  . insulin aspart  0-5 Units Subcutaneous QHS  . iron polysaccharides  150 mg Oral BID  . linezolid  600 mg Oral Q12H  . pantoprazole  40 mg Oral Daily  . senna  1 tablet Oral BID    SUBJECTIVE:  Afebrile overnight with no acute events overnight. Blood cultures remain without growth. Feeling good. Ready to sit up and ready to go home.   No Known Allergies   Review of  Systems: Review of Systems  Constitutional: Negative for chills, fever and weight loss.  Respiratory: Negative for cough, shortness of breath and wheezing.   Cardiovascular: Negative for chest pain and leg swelling.  Gastrointestinal: Negative for abdominal pain, constipation, diarrhea, nausea and vomiting.  Skin: Negative for rash.      OBJECTIVE: Vitals:   05/24/19 0500 05/24/19 0600 05/24/19 0700 05/24/19 0756  BP: 129/67 122/66 122/65   Pulse: 87 85 88   Resp: 16 16 17    Temp:    99.2 F (37.3 C)  TempSrc:    Oral  SpO2: 99% 100% 100%   Weight:      Height:       Body mass index is 38.25 kg/m.  Physical Exam Constitutional:      General: She is not in acute distress.    Appearance: Normal appearance. She is well-developed.  Neck:     Musculoskeletal: Normal range of motion and neck supple. No neck rigidity or muscular tenderness.  Cardiovascular:     Rate and Rhythm: Normal rate and regular rhythm.     Heart sounds: Normal heart sounds.  Pulmonary:     Effort: Pulmonary effort is normal.     Breath sounds: Normal breath sounds.  Lymphadenopathy:     Cervical: No cervical adenopathy.  Skin:    General: Skin is warm and dry.  Neurological:  Mental Status: She is alert and oriented to person, place, and time.  Psychiatric:        Behavior: Behavior normal.        Thought Content: Thought content normal.        Judgment: Judgment normal.     Lab Results Lab Results  Component Value Date   WBC 6.4 05/24/2019   HGB 7.6 (L) 05/24/2019   HCT 24.5 (L) 05/24/2019   MCV 86.0 05/24/2019   PLT 252 05/24/2019    Lab Results  Component Value Date   CREATININE 1.41 (H) 05/24/2019   BUN 11 05/24/2019   NA 136 05/24/2019   K 3.7 05/24/2019   CL 107 05/24/2019   CO2 18 (L) 05/24/2019    Lab Results  Component Value Date   ALT 25 10/28/2018   AST 41 10/28/2018   ALKPHOS 88 10/28/2018   BILITOT 0.7 10/28/2018     Microbiology: Recent Results (from the  past 240 hour(s))  Culture, blood (routine x 2)     Status: None   Collection Time: 05/15/19  8:17 PM   Specimen: BLOOD RIGHT ARM  Result Value Ref Range Status   Specimen Description BLOOD RIGHT ARM  Final   Special Requests   Final    BOTTLES DRAWN AEROBIC ONLY Blood Culture results may not be optimal due to an inadequate volume of blood received in culture bottles   Culture   Final    NO GROWTH 5 DAYS Performed at [facility], [place] [address], [place], [place] [address]    Report Status 05/20/2019 FINAL  Final  Culture, blood (routine x 2)     Status: None   Collection Time: 05/15/19  8:20 PM   Specimen: BLOOD RIGHT HAND  Result Value Ref Range Status   Specimen Description BLOOD RIGHT HAND  Final   Special Requests   Final    BOTTLES DRAWN AEROBIC ONLY Blood Culture results may not be optimal due to an inadequate volume of blood received in culture bottles   Culture   Final    NO GROWTH 5 DAYS Performed at [facility], [place] [address], [place], [place] [address]    Report Status 05/20/2019 FINAL  Final  CSF culture     Status: None   Collection Time: 05/15/19 10:19 PM   Specimen: CSF; Cerebrospinal Fluid  Result Value Ref Range Status   Specimen Description CSF  Final   Special Requests TUBE 2  Final   Gram Stain   Final    CYTOSPIN SMEAR WBC PRESENT,BOTH PMN AND MONONUCLEAR NO ORGANISMS SEEN    Culture   Final    NO GROWTH 3 DAYS Performed at [facility], [place] [address], [place], [place] [address]    Report Status 05/19/2019 FINAL  Final  Culture, Urine     Status: Abnormal   Collection Time: 05/16/19 12:28 AM   Specimen: Urine, Random  Result Value Ref Range Status   Specimen Description URINE, RANDOM  Final   Special Requests   Final    NONE Performed at [facility], [place] [address], [place], [place] [address]    Culture >=100,000 COLONIES/mL ENTEROBACTER AEROGENES (A)  Final   Report Status 05/17/2019 FINAL  Final   Organism ID,  Bacteria ENTEROBACTER AEROGENES (A)  Final      Susceptibility   Enterobacter aerogenes - MIC*    CEFAZOLIN >=64 RESISTANT Resistant     CEFTRIAXONE <=1 SENSITIVE Sensitive  CIPROFLOXACIN <=0.25 SENSITIVE Sensitive     GENTAMICIN <=1 SENSITIVE Sensitive     IMIPENEM 2 SENSITIVE Sensitive     NITROFURANTOIN 128 RESISTANT Resistant     TRIMETH/SULFA <=20 SENSITIVE Sensitive     PIP/TAZO <=4 SENSITIVE Sensitive     * >=100,000 COLONIES/mL ENTEROBACTER AEROGENES  CSF culture with Stat gram stain     Status: None   Collection Time: 05/18/19  9:07 AM   Specimen: CSF; Cerebrospinal Fluid  Result Value Ref Range Status   Specimen Description CSF  Final   Special Requests NONE  Final   Gram Stain   Final    WBC PRESENT,BOTH PMN AND MONONUCLEAR NO ORGANISMS SEEN CYTOSPIN SMEAR    Culture   Final    NO GROWTH 3 DAYS Performed at [facility], [place] [address] [place], [place] [address]    Report Status 05/21/2019 FINAL  Final  Culture, blood (routine x 2)     Status: None   Collection Time: 05/19/19  2:49 PM   Specimen: BLOOD  Result Value Ref Range Status   Specimen Description BLOOD RIGHT ANTECUBITAL  Final   Special Requests   Final    BOTTLES DRAWN AEROBIC ONLY Blood Culture adequate volume   Culture   Final    NO GROWTH 5 DAYS Performed at [facility], [place] [address] [place], [place] [address]    Report Status 05/24/2019 FINAL  Final  Culture, blood (routine x 2)     Status: None   Collection Time: 05/19/19  2:49 PM   Specimen: BLOOD RIGHT ARM  Result Value Ref Range Status   Specimen Description BLOOD RIGHT ARM  Final   Special Requests   Final    BOTTLES DRAWN AEROBIC ONLY Blood Culture adequate volume   Culture   Final    NO GROWTH 5 DAYS Performed at [facility], [place] [address], [place], [place] [address]    Report Status 05/24/2019 FINAL  Final  Culture, blood (routine x 2)     Status: None (Preliminary result)   Collection Time: 05/21/19   3:30 PM   Specimen: BLOOD LEFT HAND  Result Value Ref Range Status   Specimen Description BLOOD LEFT HAND  Final   Special Requests   Final    AEROBIC BOTTLE ONLY Blood Culture results may not be optimal due to an inadequate volume of blood received in culture bottles   Culture   Final    NO GROWTH 3 DAYS Performed at [facility], [place] [address], [place], [place] [address]    Report Status PENDING  Incomplete  Culture, blood (routine x 2)     Status: None (Preliminary result)   Collection Time: 05/21/19  3:35 PM   Specimen: BLOOD LEFT WRIST  Result Value Ref Range Status   Specimen Description BLOOD LEFT WRIST  Final   Special Requests   Final    AEROBIC BOTTLE ONLY Blood Culture results may not be optimal due to an inadequate volume of blood received in culture bottles   Culture   Final    NO GROWTH 3 DAYS Performed at [facility], [place] [address], [place], [place] [address]    Report Status PENDING  Incomplete     Terri Piedra, [place] [facility] [phone] Pager  05/24/2019  11:18 AM

## 2019-05-24 NOTE — Progress Notes (Signed)
Neurosurgery Service Progress Note  Subjective: No acute events overnight, no drainage overnight, fever curve improving  Objective: Vitals:   05/24/19 0100 05/24/19 0200 05/24/19 0300 05/24/19 0400  BP: (!) 94/56 121/63 95/79 128/71  Pulse: 78 82 80 86  Resp: 16 16 16 14   Temp:    99.5 F (37.5 C)  TempSrc:    Oral  SpO2: 100% 100% 100% 99%  Weight:      Height:       Temp (24hrs), Avg:99.5 F (37.5 C), Min:98.8 F (37.1 C), Max:100.2 F (37.9 C)  CBC Latest Ref Rng & Units 05/24/2019 05/23/2019 05/22/2019  WBC 4.0 - 10.5 K/uL 6.4 6.2 6.8  Hemoglobin 12.0 - 15.0 g/dL 7.6(L) 7.8(L) 7.7(L)  Hematocrit 36.0 - 46.0 % 24.5(L) 26.5(L) 25.4(L)  Platelets 150 - 400 K/uL 252 264 262   BMP Latest Ref Rng & Units 05/24/2019 05/23/2019 05/22/2019  Glucose 70 - 99 mg/dL 141(H) 113(H) 132(H)  BUN 8 - 23 mg/dL 11 12 13   Creatinine 0.44 - 1.00 mg/dL 1.41(H) 1.46(H) 1.55(H)  Sodium 135 - 145 mmol/L 136 136 135  Potassium 3.5 - 5.1 mmol/L 3.7 3.8 3.5  Chloride 98 - 111 mmol/L 107 107 107  CO2 22 - 32 mmol/L 18(L) 17(L) 18(L)  Calcium 8.9 - 10.3 mg/dL 9.1 8.7(L) 8.8(L)    Intake/Output Summary (Last 24 hours) at 05/24/2019 0717 Last data filed at 05/24/2019 0500 Gross per 24 hour  Intake 1665.83 ml  Output 2485 ml  Net -819.17 ml    Current Facility-Administered Medications:  .  0.9 %  sodium chloride infusion, , Intravenous, PRN, Newman Pies, MD, Stopped at 05/23/19 1833 .  0.9 %  sodium chloride infusion, , Intravenous, Continuous, Newman Pies, MD, Last Rate: 75 mL/hr at 05/24/19 0400 .  acetaminophen (TYLENOL) tablet 650 mg, 650 mg, Oral, Q4H PRN, 650 mg at 05/23/19 0818 **OR** acetaminophen (TYLENOL) suppository 650 mg, 650 mg, Rectal, Q4H PRN, Newman Pies, MD, 650 mg at 05/16/19 0746 .  alum & mag hydroxide-simeth (MAALOX/MYLANTA) 200-200-20 MG/5ML suspension 30 mL, 30 mL, Oral, Q6H PRN, Newman Pies, MD, 30 mL at 05/05/19 1523 .  amLODipine (NORVASC) tablet 10 mg,  10 mg, Oral, Daily, Newman Pies, MD, 10 mg at 05/23/19 1004 .  atorvastatin (LIPITOR) tablet 40 mg, 40 mg, Oral, Daily, Newman Pies, MD, 40 mg at 05/23/19 1724 .  ceFEPIme (MAXIPIME) 2 g in sodium chloride 0.9 % 100 mL IVPB, 2 g, Intravenous, Q12H, Bergman, Meghan D, NP, Last Rate: 200 mL/hr at 05/24/19 0534, 2 g at 05/24/19 0534 .  Chlorhexidine Gluconate Cloth 2 % PADS 6 each, 6 each, Topical, Daily, Newman Pies, MD, 6 each at 05/23/19 1030 .  docusate sodium (COLACE) capsule 100 mg, 100 mg, Oral, BID, Newman Pies, MD, 100 mg at 05/23/19 2101 .  feeding supplement (ENSURE ENLIVE) (ENSURE ENLIVE) liquid 237 mL, 237 mL, Oral, BID BM, Newman Pies, MD, 237 mL at 05/23/19 1422 .  Fluorescein Sodium 10 % injection 500 mg, 500 mg, Intravenous, Once, Newman Pies, MD .  gabapentin (NEURONTIN) capsule 300 mg, 300 mg, Oral, BID, Newman Pies, MD, 300 mg at 05/23/19 2101 .  Gerhardt's butt cream, , Topical, BID, Newman Pies, MD .  heparin injection 5,000 Units, 5,000 Units, Subcutaneous, Q8H, Judith Part, MD, 5,000 Units at 05/24/19 0534 .  HYDROcodone-acetaminophen (NORCO/VICODIN) 5-325 MG per tablet 1 tablet, 1 tablet, Oral, Q4H PRN, Newman Pies, MD, 1 tablet at 05/19/19 0236 .  HYDROmorphone (DILAUDID) injection 0.5 mg,  0.5 mg, Intravenous, Q3H PRN, Newman Pies, MD, 0.5 mg at 04/26/19 0939 .  insulin aspart (novoLOG) injection 0-20 Units, 0-20 Units, Subcutaneous, TID WC, Newman Pies, MD, 4 Units at 05/23/19 1558 .  insulin aspart (novoLOG) injection 0-5 Units, 0-5 Units, Subcutaneous, QHS, Newman Pies, MD, 2 Units at 05/16/19 2126 .  iron polysaccharides (NIFEREX) capsule 150 mg, 150 mg, Oral, BID, Newman Pies, MD, 150 mg at 05/23/19 2146 .  labetalol (NORMODYNE) injection 10-40 mg, 10-40 mg, Intravenous, Q10 min PRN, Newman Pies, MD, 20 mg at 05/20/19 0310 .  levofloxacin (LEVAQUIN) IVPB 750 mg, 750 mg, Intravenous, Q48H,  Judith Part, MD, Stopped at 05/22/19 1342 .  lidocaine (PF) (XYLOCAINE) 1 % injection, , , PRN, Jacqulynn Cadet, MD, 10 mL at 04/19/19 1436 .  linezolid (ZYVOX) tablet 600 mg, 600 mg, Oral, Q12H, Powers, Evern Core, MD, 600 mg at 05/23/19 2147 .  lip balm (CARMEX) ointment, , Topical, PRN, Judith Part, MD .  ondansetron (ZOFRAN) tablet 4 mg, 4 mg, Oral, Q4H PRN, 4 mg at 04/30/19 1043 **OR** ondansetron (ZOFRAN) injection 4 mg, 4 mg, Intravenous, Q4H PRN, Newman Pies, MD, 4 mg at 05/16/19 0910 .  ondansetron (ZOFRAN) tablet 4 mg, 4 mg, Oral, Q6H PRN **OR** ondansetron (ZOFRAN) injection 4 mg, 4 mg, Intravenous, Q6H PRN, Newman Pies, MD, 4 mg at 05/05/19 0941 .  pantoprazole (PROTONIX) EC tablet 40 mg, 40 mg, Oral, Daily, Newman Pies, MD, 40 mg at 05/23/19 1004 .  polyethylene glycol (MIRALAX / GLYCOLAX) packet 17 g, 17 g, Oral, Daily PRN, Newman Pies, MD .  promethazine [facility]) injection 12.5-25 mg, 12.5-25 mg, Intravenous, Q4H PRN, Newman Pies, MD, 25 mg at 05/15/19 2239 .  promethazine (PHENERGAN) tablet 12.5-25 mg, 12.5-25 mg, Oral, Q4H PRN, Newman Pies, MD .  senna [facility]) tablet 8.6 mg, 1 tablet, Oral, BID, Newman Pies, MD, 8.6 mg at 05/23/19 2102 .  sodium chloride (OCEAN) 0.65 % nasal spray 4 spray, 4 spray, Each Nare, PRN, Newman Pies, MD .  tiZANidine (ZANAFLEX) tablet 4 mg, 4 mg, Oral, Q8H PRN, Newman Pies, MD, 4 mg at 04/20/19 1554   Physical Exam: Awake, Ox3, PERRL, EOMI, FS, Strength 5/5 x4, SILTx4, VFF with mild bitemporal hemianopsia No rhinorrhea on exam this morning Abdominal incision packed on R, healing well on L  Assessment & Plan: 63 y.o. 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 Failed bedside lumbar drain placement. 8/11 LD placed  under fluoro, minimal output, 8/14 LD removed; 8/16 rhinorrhea, LD replaced, 8/19 somnolence, CTH w/ large volume pneumocephalus, LD clamped, 8/21 s/p repeat endonasal skull base reconstruction, 8/25 rpt CTH with resolving pneumocephalus, febrile started on vanc/cefepime  -cont local wound care to R abdominal wound -MRI reviewed, shows diffusion restricting material layering in the ventricles that does not look like blood products, likely ventriculitis; now day 8 of ABx -continue LD @ 15cc/hr -will clamp the LD if fever continues to be improved to evaluate if she will require shunt placement -cont qd RFPs, CBCs -SCDs/TEDs/SQH  Judith Part  05/24/19 7:17 AM

## 2019-05-24 NOTE — Progress Notes (Addendum)
Nutrition Follow-up  DOCUMENTATION CODES:   Obesity unspecified  INTERVENTION:  -Continue  Ensure Enlive increase to TID, each supplement provides 350 kcal and 20 grams of protein  -MVI with minerals daily  -Magic cup TID with meals, each supplement provides 290 kcal and 9 grams of protein - change to orange creme flavor   Advance diet to Regular to increase PO intake  NUTRITION DIAGNOSIS:   Inadequate oral intake related to decreased appetite as evidenced by meal completion < 50%.  Progressing GOAL:   Patient will meet greater than or equal to 90% of their needs  Progressing   MONITOR:   PO intake, Supplement acceptance  REASON FOR ASSESSMENT:   Rounds    ASSESSMENT:  RD working remotely.  Pt with PMH of DM, HTN, pituitary adenomawith bacterial meningitis earlier this year now admitted for repeat endoscopic endonasal resection of recurrent pituitary adenoma.  Pt with CSF leak s/p multi-layered repair but developed post-op CSF rhinorrhea.  8/29 - lumbar drain removed 9/7 - lumbar drain placement  Pt confused. Lunch at bedside untouched 9/14. Per RN did not want to eat but does drink her ensure well.  Continues to have lumbar drain, per MD may need shunt. Monitoring fevers. Remains on full liquids.   Medications reviewed and include: gabapentin, colace,senokot, SSI novolog  Diet Order:   Diet Order            Diet full liquid Room service appropriate? Yes with Assist; Fluid consistency: Thin  Diet effective now              EDUCATION NEEDS:   Education needs have been addressed  Skin:  Skin Assessment: Reviewed RN Assessment Skin Integrity Issues:: Incisions, Other (Comment) Incisions: closed back, abdomen, and nose Other: non pressure wounds to rt and lt labia  Last BM:  9/14 large  Height:   Ht Readings from Last 1 Encounters:  05/16/19 5\' 6"  (1.676 m)    Weight:   Wt Readings from Last 1 Encounters:  05/16/19 107.5 kg    Ideal Body  Weight:  59 kg  BMI:  Body mass index is 38.25 kg/m.  Estimated Nutritional Needs:   Kcal:  2000-2200  Protein:  100-120 grams  Fluid:  > 2 L/day  Maylon Peppers RD, LDN, CNSC [phone] Pager [phone] After Hours Pager

## 2019-05-25 DIAGNOSIS — G0490 Encephalitis and encephalomyelitis, unspecified: Secondary | ICD-10-CM

## 2019-05-25 LAB — RENAL FUNCTION PANEL
Albumin: 3.2 g/dL — ABNORMAL LOW (ref 3.5–5.0)
Anion gap: 12 (ref 5–15)
BUN: 12 mg/dL (ref 8–23)
CO2: 18 mmol/L — ABNORMAL LOW (ref 22–32)
Calcium: 9.4 mg/dL (ref 8.9–10.3)
Chloride: 107 mmol/L (ref 98–111)
Creatinine, Ser: 1.46 mg/dL — ABNORMAL HIGH (ref 0.44–1.00)
GFR calc Af Amer: 45 mL/min — ABNORMAL LOW (ref >60)
GFR calc non Af Amer: 38 mL/min — ABNORMAL LOW (ref >60)
Glucose, Bld: 147 mg/dL — ABNORMAL HIGH (ref 70–99)
Phosphorus: 2.3 mg/dL — ABNORMAL LOW (ref 2.5–4.6)
Potassium: 3.7 mmol/L (ref 3.5–5.1)
Sodium: 137 mmol/L (ref 135–145)

## 2019-05-25 LAB — GLUCOSE, CAPILLARY
Glucose-Capillary: 131 mg/dL — ABNORMAL HIGH (ref 70–99)
Glucose-Capillary: 133 mg/dL — ABNORMAL HIGH (ref 70–99)
Glucose-Capillary: 128 mg/dL — ABNORMAL HIGH (ref 70–99)
Glucose-Capillary: 111 mg/dL — ABNORMAL HIGH (ref 70–99)

## 2019-05-25 LAB — CBC
HCT: 28.8 % — ABNORMAL LOW (ref 36.0–46.0)
Hemoglobin: 8.5 g/dL — ABNORMAL LOW (ref 12.0–15.0)
MCH: 25.9 pg — ABNORMAL LOW (ref 26.0–34.0)
MCHC: 29.5 g/dL — ABNORMAL LOW (ref 30.0–36.0)
MCV: 87.8 fL (ref 80.0–100.0)
Platelets: 273 10*3/uL (ref 150–400)
RBC: 3.28 MIL/uL — ABNORMAL LOW (ref 3.87–5.11)
RDW: 16.6 % — ABNORMAL HIGH (ref 11.5–15.5)
WBC: 5.9 10*3/uL (ref 4.0–10.5)
nRBC: 0 % (ref 0.0–0.2)

## 2019-05-25 NOTE — Progress Notes (Signed)
[facility] for Infectious Disease  Date of Admission:  04/15/2019     Total days of antibiotics 10         ASSESSMENT/PLAN  Ms. Klehr continues to clinically appear well and has no symptoms of meningitis. CSF remains clear and blood cultures remain without growth. She did have an elevated temperature of 100.5 yesterday afternoon and has been afebrile since. Fevers may be related to ventriculitis.   If she continues to have elevated temperatures, ENT re-evaluation may be beneficial although she remains on a very broad spectrum of antimicrobial therapy at present with levofloxacin, cefepime and linezolid. Could also consider interrogation of the drain as source of infection. Platelet count remains stable with continued linezolid and no evidence of thrombocytopenia. Will continue with plan to end antibiotic therapy on 9/18 with end dates placed.   PLAN:  1. Continue levofloxacin, cefepime, and linezolid through 9/18. 2. If continues to have fever recommend ENT evaluation or further interrogation of lumbar drain. 3. Drain management per neurosurgery. 4. Continue to monitor platelet for thrombocytopenia while on linezolid    Principal Problem:   Fever Active Problems:   Acute bacterial meningitis   Diabetes mellitus type II, non insulin dependent (HCC)   AKI (acute kidney injury) ([place])   Pituitary adenoma (HCC)   CSF leak   . amLODipine  10 mg Oral Daily  . atorvastatin  40 mg Oral Daily  . Chlorhexidine Gluconate Cloth  6 each Topical Daily  . docusate sodium  100 mg Oral BID  . feeding supplement (ENSURE ENLIVE)  237 mL Oral TID BM  . Fluorescein Sodium  500 mg Intravenous Once  . gabapentin  300 mg Oral BID  . Gerhardt's butt cream   Topical BID  . heparin injection (subcutaneous)  5,000 Units Subcutaneous Q8H  . insulin aspart  0-20 Units Subcutaneous TID WC  . insulin aspart  0-5 Units Subcutaneous QHS  . iron polysaccharides  150 mg Oral BID  . linezolid  600 mg  Oral Q12H  . pantoprazole  40 mg Oral Daily  . senna  1 tablet Oral BID    SUBJECTIVE:  Max temperature of 100.5 overnight with no acute events/concerns. Feeling okay today. Sleeping upon entry to the room. Denies neck pain or stiffness.   No Known Allergies   Review of Systems: Review of Systems  Constitutional: Negative for chills, fever and weight loss.  Respiratory: Negative for cough, shortness of breath and wheezing.   Cardiovascular: Negative for chest pain and leg swelling.  Gastrointestinal: Negative for abdominal pain, constipation, diarrhea, nausea and vomiting.  Skin: Negative for itching and rash.    OBJECTIVE: Vitals:   05/25/19 0600 05/25/19 0700 05/25/19 0800 05/25/19 0900  BP: 124/79 (!) 143/79 134/81 140/82  Pulse: 78 84 87 82  Resp: 14 13 14 15   Temp:      TempSrc:      SpO2: 99% 100% 100% 100%  Weight:      Height:       Body mass index is 38.25 kg/m.  Physical Exam Constitutional:      General: She is not in acute distress.    Appearance: She is well-developed.  Neck:     Musculoskeletal: No neck rigidity or muscular tenderness.  Cardiovascular:     Rate and Rhythm: Normal rate and regular rhythm.     Heart sounds: Normal heart sounds.  Pulmonary:     Effort: Pulmonary effort is normal.     Breath sounds:  Normal breath sounds. No wheezing or rhonchi.  Abdominal:     General: Bowel sounds are normal.  Lymphadenopathy:     Cervical: No cervical adenopathy.  Skin:    General: Skin is warm and dry.  Neurological:     Mental Status: She is alert and oriented to person, place, and time.  Psychiatric:        Behavior: Behavior normal.        Thought Content: Thought content normal.        Judgment: Judgment normal.     Lab Results Lab Results  Component Value Date   WBC 5.9 05/25/2019   HGB 8.5 (L) 05/25/2019   HCT 28.8 (L) 05/25/2019   MCV 87.8 05/25/2019   PLT 273 05/25/2019    Lab Results  Component Value Date   CREATININE  1.46 (H) 05/25/2019   BUN 12 05/25/2019   NA 137 05/25/2019   K 3.7 05/25/2019   CL 107 05/25/2019   CO2 18 (L) 05/25/2019    Lab Results  Component Value Date   ALT 25 10/28/2018   AST 41 10/28/2018   ALKPHOS 88 10/28/2018   BILITOT 0.7 10/28/2018     Microbiology: Recent Results (from the past 240 hour(s))  Culture, blood (routine x 2)     Status: None   Collection Time: 05/15/19  8:17 PM   Specimen: BLOOD RIGHT ARM  Result Value Ref Range Status   Specimen Description BLOOD RIGHT ARM  Final   Special Requests   Final    BOTTLES DRAWN AEROBIC ONLY Blood Culture results may not be optimal due to an inadequate volume of blood received in culture bottles   Culture   Final    NO GROWTH 5 DAYS Performed at [facility], [place] [address] [place], [place] [address]    Report Status 05/20/2019 FINAL  Final  Culture, blood (routine x 2)     Status: None   Collection Time: 05/15/19  8:20 PM   Specimen: BLOOD RIGHT HAND  Result Value Ref Range Status   Specimen Description BLOOD RIGHT HAND  Final   Special Requests   Final    BOTTLES DRAWN AEROBIC ONLY Blood Culture results may not be optimal due to an inadequate volume of blood received in culture bottles   Culture   Final    NO GROWTH 5 DAYS Performed at [facility], [place] [address], [place], [place] [address]    Report Status 05/20/2019 FINAL  Final  CSF culture     Status: None   Collection Time: 05/15/19 10:19 PM   Specimen: CSF; Cerebrospinal Fluid  Result Value Ref Range Status   Specimen Description CSF  Final   Special Requests TUBE 2  Final   Gram Stain   Final    CYTOSPIN SMEAR WBC PRESENT,BOTH PMN AND MONONUCLEAR NO ORGANISMS SEEN    Culture   Final    NO GROWTH 3 DAYS Performed at [facility], [place] [address], [place], [place] [address]    Report Status 05/19/2019 FINAL  Final  Culture, Urine     Status: Abnormal   Collection Time: 05/16/19 12:28 AM   Specimen: Urine, Random  Result  Value Ref Range Status   Specimen Description URINE, RANDOM  Final   Special Requests   Final    NONE Performed at [facility], [place] [address] [place], [place] [address]    Culture >=100,000 COLONIES/mL ENTEROBACTER AEROGENES (A)  Final  Report Status 05/17/2019 FINAL  Final   Organism ID, Bacteria ENTEROBACTER AEROGENES (A)  Final      Susceptibility   Enterobacter aerogenes - MIC*    CEFAZOLIN >=64 RESISTANT Resistant     CEFTRIAXONE <=1 SENSITIVE Sensitive     CIPROFLOXACIN <=0.25 SENSITIVE Sensitive     GENTAMICIN <=1 SENSITIVE Sensitive     IMIPENEM 2 SENSITIVE Sensitive     NITROFURANTOIN 128 RESISTANT Resistant     TRIMETH/SULFA <=20 SENSITIVE Sensitive     PIP/TAZO <=4 SENSITIVE Sensitive     * >=100,000 COLONIES/mL ENTEROBACTER AEROGENES  CSF culture with Stat gram stain     Status: None   Collection Time: 05/18/19  9:07 AM   Specimen: CSF; Cerebrospinal Fluid  Result Value Ref Range Status   Specimen Description CSF  Final   Special Requests NONE  Final   Gram Stain   Final    WBC PRESENT,BOTH PMN AND MONONUCLEAR NO ORGANISMS SEEN CYTOSPIN SMEAR    Culture   Final    NO GROWTH 3 DAYS Performed at [facility], [place] [address], [place], [place] [address]    Report Status 05/21/2019 FINAL  Final  Culture, blood (routine x 2)     Status: None   Collection Time: 05/19/19  2:49 PM   Specimen: BLOOD  Result Value Ref Range Status   Specimen Description BLOOD RIGHT ANTECUBITAL  Final   Special Requests   Final    BOTTLES DRAWN AEROBIC ONLY Blood Culture adequate volume   Culture   Final    NO GROWTH 5 DAYS Performed at [facility], [place] [address], [place], [place] [address]    Report Status 05/24/2019 FINAL  Final  Culture, blood (routine x 2)     Status: None   Collection Time: 05/19/19  2:49 PM   Specimen: BLOOD RIGHT ARM  Result Value Ref Range Status   Specimen Description BLOOD RIGHT ARM  Final   Special Requests   Final    BOTTLES  DRAWN AEROBIC ONLY Blood Culture adequate volume   Culture   Final    NO GROWTH 5 DAYS Performed at [facility], [place] [address], [place], [place] [address]    Report Status 05/24/2019 FINAL  Final  Culture, blood (routine x 2)     Status: None (Preliminary result)   Collection Time: 05/21/19  3:30 PM   Specimen: BLOOD LEFT HAND  Result Value Ref Range Status   Specimen Description BLOOD LEFT HAND  Final   Special Requests   Final    AEROBIC BOTTLE ONLY Blood Culture results may not be optimal due to an inadequate volume of blood received in culture bottles   Culture   Final    NO GROWTH 3 DAYS Performed at [facility], [place] [address] [place], [place] [address]    Report Status PENDING  Incomplete  Culture, blood (routine x 2)     Status: None (Preliminary result)   Collection Time: 05/21/19  3:35 PM   Specimen: BLOOD LEFT WRIST  Result Value Ref Range Status   Specimen Description BLOOD LEFT WRIST  Final   Special Requests   Final    AEROBIC BOTTLE ONLY Blood Culture results may not be optimal due to an inadequate volume of blood received in culture bottles   Culture   Final    NO GROWTH 3 DAYS Performed at [facility], [place] [address], [place], [place] [address]  Report Status PENDING  Incomplete     Terri Piedra, NP [facility] [phone] Pager  05/25/2019  9:24 AM

## 2019-05-25 NOTE — Progress Notes (Signed)
Neurosurgery Service Progress Note  Subjective: No acute events overnight, no drainage overnight, fevers now significantly improved x48h  Objective: Vitals:   05/25/19 0500 05/25/19 0600 05/25/19 0700 05/25/19 0800  BP: 125/76 124/79 (!) 143/79 134/81  Pulse: 85 78 84 87  Resp: 16 14 13 14   Temp:      TempSrc:      SpO2: 100% 99% 100% 100%  Weight:      Height:       Temp (24hrs), Avg:99 F (37.2 C), Min:98.1 F (36.7 C), Max:100.5 F (38.1 C)  CBC Latest Ref Rng & Units 05/25/2019 05/24/2019 05/23/2019  WBC 4.0 - 10.5 K/uL 5.9 6.4 6.2  Hemoglobin 12.0 - 15.0 g/dL 8.5(L) 7.6(L) 7.8(L)  Hematocrit 36.0 - 46.0 % 28.8(L) 24.5(L) 26.5(L)  Platelets 150 - 400 K/uL 273 252 264   BMP Latest Ref Rng & Units 05/25/2019 05/24/2019 05/23/2019  Glucose 70 - 99 mg/dL 147(H) 141(H) 113(H)  BUN 8 - 23 mg/dL 12 11 12   Creatinine 0.44 - 1.00 mg/dL 1.46(H) 1.41(H) 1.46(H)  Sodium 135 - 145 mmol/L 137 136 136  Potassium 3.5 - 5.1 mmol/L 3.7 3.7 3.8  Chloride 98 - 111 mmol/L 107 107 107  CO2 22 - 32 mmol/L 18(L) 18(L) 17(L)  Calcium 8.9 - 10.3 mg/dL 9.4 9.1 8.7(L)    Intake/Output Summary (Last 24 hours) at 05/25/2019 0826 Last data filed at 05/25/2019 0800 Gross per 24 hour  Intake 2398.27 ml  Output 1717 ml  Net 681.27 ml    Current Facility-Administered Medications:  .  0.9 %  sodium chloride infusion, , Intravenous, PRN, Newman Pies, MD, Last Rate: 10 mL/hr at 05/25/19 0800 .  0.9 %  sodium chloride infusion, , Intravenous, Continuous, Newman Pies, MD, Last Rate: 75 mL/hr at 05/25/19 0800 .  acetaminophen (TYLENOL) tablet 650 mg, 650 mg, Oral, Q4H PRN, 650 mg at 05/24/19 2205 **OR** acetaminophen (TYLENOL) suppository 650 mg, 650 mg, Rectal, Q4H PRN, Newman Pies, MD, 650 mg at 05/16/19 0746 .  alum & mag hydroxide-simeth (MAALOX/MYLANTA) 200-200-20 MG/5ML suspension 30 mL, 30 mL, Oral, Q6H PRN, Newman Pies, MD, 30 mL at 05/05/19 1523 .  amLODipine (NORVASC) tablet 10  mg, 10 mg, Oral, Daily, Newman Pies, MD, 10 mg at 05/24/19 0955 .  atorvastatin (LIPITOR) tablet 40 mg, 40 mg, Oral, Daily, Newman Pies, MD, 40 mg at 05/24/19 1731 .  ceFEPIme (MAXIPIME) 2 g in sodium chloride 0.9 % 100 mL IVPB, 2 g, Intravenous, Q12H, Bergman, Meghan D, NP, Stopped at 05/25/19 [phone] .  Chlorhexidine Gluconate Cloth 2 % PADS 6 each, 6 each, Topical, Daily, Newman Pies, MD, 6 each at 05/24/19 2200 .  docusate sodium (COLACE) capsule 100 mg, 100 mg, Oral, BID, Newman Pies, MD, 100 mg at 05/24/19 2204 .  feeding supplement (ENSURE ENLIVE) (ENSURE ENLIVE) liquid 237 mL, 237 mL, Oral, TID BM, Judith Part, MD, 237 mL at 05/24/19 2030 .  Fluorescein Sodium 10 % injection 500 mg, 500 mg, Intravenous, Once, Newman Pies, MD .  gabapentin (NEURONTIN) capsule 300 mg, 300 mg, Oral, BID, Newman Pies, MD, 300 mg at 05/24/19 2204 .  Gerhardt's butt cream, , Topical, BID, Newman Pies, MD .  heparin injection 5,000 Units, 5,000 Units, Subcutaneous, Q8H, Judith Part, MD, 5,000 Units at 05/25/19 [phone]  HYDROcodone-acetaminophen (NORCO/VICODIN) 5-325 MG per tablet 1 tablet, 1 tablet, Oral, Q4H PRN, Newman Pies, MD, 1 tablet at 05/19/19 0236 .  HYDROmorphone (DILAUDID) injection 0.5 mg, 0.5 mg, Intravenous, Q3H PRN,  Newman Pies, MD, 0.5 mg at 04/26/19 [id number] .  insulin aspart (novoLOG) injection 0-20 Units, 0-20 Units, Subcutaneous, TID WC, Newman Pies, MD, 4 Units at 05/24/19 1624 .  insulin aspart (novoLOG) injection 0-5 Units, 0-5 Units, Subcutaneous, QHS, Newman Pies, MD, 2 Units at 05/16/19 2126 .  iron polysaccharides (NIFEREX) capsule 150 mg, 150 mg, Oral, BID, Newman Pies, MD, 150 mg at 05/24/19 2205 .  labetalol (NORMODYNE) injection 10-40 mg, 10-40 mg, Intravenous, Q10 min PRN, Newman Pies, MD, 20 mg at 05/20/19 0310 .  levofloxacin (LEVAQUIN) IVPB 750 mg, 750 mg, Intravenous, Q48H, Judith Part, MD, Stopped at  05/24/19 1527 .  lidocaine (PF) (XYLOCAINE) 1 % injection, , , PRN, Jacqulynn Cadet, MD, 10 mL at 04/19/19 1436 .  linezolid (ZYVOX) tablet 600 mg, 600 mg, Oral, Q12H, Powers, Evern Core, MD, 600 mg at 05/24/19 2205 .  lip balm (CARMEX) ointment, , Topical, PRN, Judith Part, MD .  ondansetron (ZOFRAN) tablet 4 mg, 4 mg, Oral, Q4H PRN, 4 mg at 04/30/19 1043 **OR** ondansetron (ZOFRAN) injection 4 mg, 4 mg, Intravenous, Q4H PRN, Newman Pies, MD, 4 mg at 05/16/19 0910 .  ondansetron (ZOFRAN) tablet 4 mg, 4 mg, Oral, Q6H PRN **OR** ondansetron (ZOFRAN) injection 4 mg, 4 mg, Intravenous, Q6H PRN, Newman Pies, MD, 4 mg at 05/05/19 0941 .  pantoprazole (PROTONIX) EC tablet 40 mg, 40 mg, Oral, Daily, Newman Pies, MD, 40 mg at 05/24/19 0956 .  polyethylene glycol (MIRALAX / GLYCOLAX) packet 17 g, 17 g, Oral, Daily PRN, Newman Pies, MD .  promethazine [facility]) injection 12.5-25 mg, 12.5-25 mg, Intravenous, Q4H PRN, Newman Pies, MD, 25 mg at 05/15/19 2239 .  promethazine (PHENERGAN) tablet 12.5-25 mg, 12.5-25 mg, Oral, Q4H PRN, Newman Pies, MD .  senna [facility]) tablet 8.6 mg, 1 tablet, Oral, BID, Newman Pies, MD, 8.6 mg at 05/24/19 2205 .  sodium chloride (OCEAN) 0.65 % nasal spray 4 spray, 4 spray, Each Nare, PRN, Newman Pies, MD .  tiZANidine (ZANAFLEX) tablet 4 mg, 4 mg, Oral, Q8H PRN, Newman Pies, MD, 4 mg at 04/20/19 1554   Physical Exam: Awake, Ox3, PERRL, EOMI, FS, Strength 5/5 x4, SILTx4, VFF with mild bitemporal hemianopsia No rhinorrhea on exam this morning Abdominal incision packed on R, healing well on L  Assessment & Plan: 63 y.o. 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 Failed bedside lumbar drain placement. 8/11 LD placed under fluoro, minimal output, 8/14 LD  removed; 8/16 rhinorrhea, LD replaced, 8/19 somnolence, CTH w/ large volume pneumocephalus, LD clamped, 8/21 s/p repeat endonasal skull base reconstruction, 8/25 rpt CTH with resolving pneumocephalus, febrile started on vanc/cefepime  -cont local wound care to R abdominal wound -MRI reviewed, shows diffusion restricting material layering in the ventricles that does not look like blood products, likely ventriculitis; now day 9 of ABx -will clamp lumbar drain today and monitor for drainage or s/sx of HCP  -cont qd RFPs, CBCs -SCDs/TEDs/SQH  Judith Part  05/25/19 8:26 AM

## 2019-05-26 ENCOUNTER — Inpatient Hospital Stay (HOSPITAL_COMMUNITY): Payer: BC Managed Care – PPO

## 2019-05-26 LAB — RENAL FUNCTION PANEL
Albumin: 3.1 g/dL — ABNORMAL LOW (ref 3.5–5.0)
Anion gap: 12 (ref 5–15)
BUN: 10 mg/dL (ref 8–23)
CO2: 17 mmol/L — ABNORMAL LOW (ref 22–32)
Calcium: 9.4 mg/dL (ref 8.9–10.3)
Chloride: 109 mmol/L (ref 98–111)
Creatinine, Ser: 1.4 mg/dL — ABNORMAL HIGH (ref 0.44–1.00)
GFR calc Af Amer: 47 mL/min — ABNORMAL LOW (ref >60)
GFR calc non Af Amer: 40 mL/min — ABNORMAL LOW (ref >60)
Glucose, Bld: 125 mg/dL — ABNORMAL HIGH (ref 70–99)
Phosphorus: 2.8 mg/dL (ref 2.5–4.6)
Potassium: 4.4 mmol/L (ref 3.5–5.1)
Sodium: 138 mmol/L (ref 135–145)

## 2019-05-26 LAB — CULTURE, BLOOD (ROUTINE X 2)
Culture: NO GROWTH
Culture: NO GROWTH

## 2019-05-26 LAB — GLUCOSE, CAPILLARY
Glucose-Capillary: 114 mg/dL — ABNORMAL HIGH (ref 70–99)
Glucose-Capillary: 133 mg/dL — ABNORMAL HIGH (ref 70–99)
Glucose-Capillary: 118 mg/dL — ABNORMAL HIGH (ref 70–99)
Glucose-Capillary: 123 mg/dL — ABNORMAL HIGH (ref 70–99)

## 2019-05-26 NOTE — Progress Notes (Signed)
  NEUROSURGERY PROGRESS NOTE   Received call from nursing that patient is much more lethargic since clamping of LD drain this am. Lumbar drain unclamped.

## 2019-05-26 NOTE — Progress Notes (Signed)
RN doing rounds on patient noticed that she was more lethargic and when aroused she could not answer orientation questions other than her name. MD paged and STAT head CT ordered. Drain is now open per MD request. [name], Rande Brunt, RN

## 2019-05-26 NOTE — Progress Notes (Signed)
Pharmacy Antibiotic Note  [name] is a 63 y.o. female admitted on 04/15/2019 status post second CSF leak revision that included lumbar drain placement andendonasal repair and patch.  ID service is currently following, and pharmacy has been consulted for cefepime dosing empirically for meningitis.   Patient has remained afebrile with a Tmax of 98.8. WBC are WNL. Renal function has remained stable with Scr 1.40 today, CrCl 52.4 ml/min. Will plan to continue antibiotics through 9/18, per ID.  Plan: - Continue Cefepime 2 g IV Q12h - Continue Linezolid 600 mg PO Q12h - Continue Levofloxacin 750 mg IV Q48h - Stop antibiotics after 9/18 - Monitor cultures, renal function, and clinical progression  Height: 5\' 6"  (167.6 cm) Weight: 236 lb 15.9 oz (107.5 kg) IBW/kg (Calculated) : 59.3  Temp (24hrs), Avg:98.6 F (37 C), Min:98.3 F (36.8 C), Max:98.8 F (37.1 C)  Recent Labs  Lab 05/20/19 1123 05/21/19 0355 05/22/19 0527 05/23/19 0225 05/24/19 0418 05/25/19 0458 05/26/19 0552  WBC 6.6 5.7 6.8 6.2 6.4 5.9  --   CREATININE  --  1.74* 1.55* 1.46* 1.41* 1.46* 1.40*  VANCORANDOM 17  --   --   --   --   --   --     Estimated Creatinine Clearance: 52.4 mL/min (A) (by C-G formula based on SCr of 1.4 mg/dL (H)).    No Known Allergies  Antimicrobials this admission: Vancomycin 9/7 >> 9/12 Cefepime 9/7 >>  Levofloxacin 9/9 >> Linezolid 9/12 >>  Dose adjustments this admission: N/A  Microbiology results: 9/12 BCx: neg 9/10 BCx: neg 9/9 CSF: neg 9/6 BCx: ng-final 9/6 CSF: ng-final 9/7 UCx: enterobacter aerogenes (R: cefazolin, macrobid) 8/26 BCx: negative 8/26 CSF: no growth 8/21 wound Cx: Enterobacter aer + citrobacter (both cefazolin res) 8/20 40k only Enterobacter- S to Cipro, Gent, Imip, and Bactrim; R-Zosyn, Ancef; I-CTX, Nitrof (no tx needed) 8/16 CSF: negative 8/9 BCx: negative  Richardine Service, PharmD PGY1 Pharmacy Resident Phone: [phone] 05/26/2019  12:12  PM  Please check [email or web address] for unit-specific pharmacy phone numbers.

## 2019-05-26 NOTE — Progress Notes (Signed)
Neurosurgery Service Progress Note  Subjective: No acute events overnight, no drainage overnight, Tmax 37.1 over past 24h, LD was unclamped due to lethargy  Objective: Vitals:   05/26/19 0600 05/26/19 0700 05/26/19 0800 05/26/19 0818  BP: 129/78 118/88 126/71   Pulse: 85 83 85   Resp: 14 13 14    Temp:    98.3 F (36.8 C)  TempSrc:    Oral  SpO2: 100% 100% 100%   Weight:      Height:       Temp (24hrs), Avg:98.5 F (36.9 C), Min:98.3 F (36.8 C), Max:98.8 F (37.1 C)  CBC Latest Ref Rng & Units 05/25/2019 05/24/2019 05/23/2019  WBC 4.0 - 10.5 K/uL 5.9 6.4 6.2  Hemoglobin 12.0 - 15.0 g/dL 8.5(L) 7.6(L) 7.8(L)  Hematocrit 36.0 - 46.0 % 28.8(L) 24.5(L) 26.5(L)  Platelets 150 - 400 K/uL 273 252 264   BMP Latest Ref Rng & Units 05/26/2019 05/25/2019 05/24/2019  Glucose 70 - 99 mg/dL 125(H) 147(H) 141(H)  BUN 8 - 23 mg/dL 10 12 11   Creatinine 0.44 - 1.00 mg/dL 1.40(H) 1.46(H) 1.41(H)  Sodium 135 - 145 mmol/L 138 137 136  Potassium 3.5 - 5.1 mmol/L 4.4 3.7 3.7  Chloride 98 - 111 mmol/L 109 107 107  CO2 22 - 32 mmol/L 17(L) 18(L) 18(L)  Calcium 8.9 - 10.3 mg/dL 9.4 9.4 9.1    Intake/Output Summary (Last 24 hours) at 05/26/2019 0826 Last data filed at 05/26/2019 0800 Gross per 24 hour  Intake 2270.69 ml  Output 3198 ml  Net -927.31 ml    Current Facility-Administered Medications:  .  0.9 %  sodium chloride infusion, , Intravenous, PRN, Newman Pies, MD, Stopped at 05/25/19 1657 .  0.9 %  sodium chloride infusion, , Intravenous, Continuous, Newman Pies, MD, Last Rate: 75 mL/hr at 05/26/19 0800 .  acetaminophen (TYLENOL) tablet 650 mg, 650 mg, Oral, Q4H PRN, 650 mg at 05/24/19 2205 **OR** acetaminophen (TYLENOL) suppository 650 mg, 650 mg, Rectal, Q4H PRN, Newman Pies, MD, 650 mg at 05/16/19 0746 .  alum & mag hydroxide-simeth (MAALOX/MYLANTA) 200-200-20 MG/5ML suspension 30 mL, 30 mL, Oral, Q6H PRN, Newman Pies, MD, 30 mL at 05/05/19 1523 .  amLODipine (NORVASC)  tablet 10 mg, 10 mg, Oral, Daily, Newman Pies, MD, 10 mg at 05/25/19 0939 .  atorvastatin (LIPITOR) tablet 40 mg, 40 mg, Oral, Daily, Newman Pies, MD, 40 mg at 05/25/19 1703 .  ceFEPIme (MAXIPIME) 2 g in sodium chloride 0.9 % 100 mL IVPB, 2 g, Intravenous, Q12H, Golden Circle, FNP, Stopped at 05/26/19 0725 .  Chlorhexidine Gluconate Cloth 2 % PADS 6 each, 6 each, Topical, Daily, Newman Pies, MD, 6 each at 05/25/19 [phone]  docusate sodium (COLACE) capsule 100 mg, 100 mg, Oral, BID, Newman Pies, MD, 100 mg at 05/25/19 2123 .  feeding supplement (ENSURE ENLIVE) (ENSURE ENLIVE) liquid 237 mL, 237 mL, Oral, TID BM, Judith Part, MD, 237 mL at 05/25/19 2107 .  Fluorescein Sodium 10 % injection 500 mg, 500 mg, Intravenous, Once, Newman Pies, MD .  gabapentin (NEURONTIN) capsule 300 mg, 300 mg, Oral, BID, Newman Pies, MD, 300 mg at 05/25/19 2123 .  Gerhardt's butt cream, , Topical, BID, Newman Pies, MD .  heparin injection 5,000 Units, 5,000 Units, Subcutaneous, Q8H, Judith Part, MD, 5,000 Units at 05/26/19 0603 .  HYDROcodone-acetaminophen (NORCO/VICODIN) 5-325 MG per tablet 1 tablet, 1 tablet, Oral, Q4H PRN, Newman Pies, MD, 1 tablet at 05/19/19 0236 .  HYDROmorphone (DILAUDID) injection 0.5 mg,  0.5 mg, Intravenous, Q3H PRN, Newman Pies, MD, 0.5 mg at 04/26/19 0939 .  insulin aspart (novoLOG) injection 0-20 Units, 0-20 Units, Subcutaneous, TID WC, Newman Pies, MD, 3 Units at 05/25/19 1703 .  insulin aspart (novoLOG) injection 0-5 Units, 0-5 Units, Subcutaneous, QHS, Newman Pies, MD, 2 Units at 05/16/19 2126 .  iron polysaccharides (NIFEREX) capsule 150 mg, 150 mg, Oral, BID, Newman Pies, MD, 150 mg at 05/25/19 2123 .  labetalol (NORMODYNE) injection 10-40 mg, 10-40 mg, Intravenous, Q10 min PRN, Newman Pies, MD, 20 mg at 05/20/19 0310 .  levofloxacin (LEVAQUIN) IVPB 750 mg, 750 mg, Intravenous, Q48H, Golden Circle, FNP,  Stopped at 05/24/19 1527 .  lidocaine (PF) (XYLOCAINE) 1 % injection, , , PRN, Jacqulynn Cadet, MD, 10 mL at 04/19/19 1436 .  linezolid (ZYVOX) tablet 600 mg, 600 mg, Oral, Q12H, Golden Circle, FNP, 600 mg at 05/25/19 2123 .  lip balm (CARMEX) ointment, , Topical, PRN, Judith Part, MD .  ondansetron (ZOFRAN) tablet 4 mg, 4 mg, Oral, Q4H PRN, 4 mg at 04/30/19 1043 **OR** ondansetron (ZOFRAN) injection 4 mg, 4 mg, Intravenous, Q4H PRN, Newman Pies, MD, 4 mg at 05/16/19 0910 .  ondansetron (ZOFRAN) tablet 4 mg, 4 mg, Oral, Q6H PRN **OR** ondansetron (ZOFRAN) injection 4 mg, 4 mg, Intravenous, Q6H PRN, Newman Pies, MD, 4 mg at 05/05/19 0941 .  pantoprazole (PROTONIX) EC tablet 40 mg, 40 mg, Oral, Daily, Newman Pies, MD, 40 mg at 05/25/19 0939 .  polyethylene glycol (MIRALAX / GLYCOLAX) packet 17 g, 17 g, Oral, Daily PRN, Newman Pies, MD .  promethazine [facility]) injection 12.5-25 mg, 12.5-25 mg, Intravenous, Q4H PRN, Newman Pies, MD, 25 mg at 05/15/19 2239 .  promethazine (PHENERGAN) tablet 12.5-25 mg, 12.5-25 mg, Oral, Q4H PRN, Newman Pies, MD .  senna [facility]) tablet 8.6 mg, 1 tablet, Oral, BID, Newman Pies, MD, 8.6 mg at 05/25/19 2123 .  sodium chloride (OCEAN) 0.65 % nasal spray 4 spray, 4 spray, Each Nare, PRN, Newman Pies, MD .  tiZANidine (ZANAFLEX) tablet 4 mg, 4 mg, Oral, Q8H PRN, Newman Pies, MD, 4 mg at 04/20/19 1554   Physical Exam: Awake, Ox3, PERRL, EOMI, FS, Strength 5/5 x4, SILTx4, VFF with mild bitemporal hemianopsia No rhinorrhea on exam this morning Abdominal incision packed on R, healing well on L  Assessment & Plan: 63 y.o. 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 Failed bedside lumbar drain placement. 8/11 LD placed under fluoro, minimal  output, 8/14 LD removed; 8/16 rhinorrhea, LD replaced, 8/19 somnolence, CTH w/ large volume pneumocephalus, LD clamped, 8/21 s/p repeat endonasal skull base reconstruction, 8/25 rpt CTH with resolving pneumocephalus, febrile started on vanc/cefepime  -cont local wound care to R abdominal wound -likely ventriculitis; now day 9 of ABx, afebrile -will attempt another clamp trial today, if she fails and remains afebrile, will plan on shunt placement Monday or Tuesday -if she has a neurologic change while drain is clamped, get CT head without contrast and keep drain clamped until scan is done -cont qd RFPs, CBCs -SCDs/TEDs/SQH  Joyice Faster   05/26/19 8:26 AM

## 2019-05-26 NOTE — Progress Notes (Signed)
[place] reviewed, confirmed clinical suspicion of hydrocephalus. Restart lumbar drain to drain 15cc/hr. If she continues to be afebrile throughout the weekend, will plan on VP shunt placement Monday or Tuesday depending on the OR schedule.

## 2019-05-27 LAB — GLUCOSE, CAPILLARY
Glucose-Capillary: 122 mg/dL — ABNORMAL HIGH (ref 70–99)
Glucose-Capillary: 107 mg/dL — ABNORMAL HIGH (ref 70–99)
Glucose-Capillary: 118 mg/dL — ABNORMAL HIGH (ref 70–99)
Glucose-Capillary: 110 mg/dL — ABNORMAL HIGH (ref 70–99)

## 2019-05-27 LAB — RENAL FUNCTION PANEL
Albumin: 3.1 g/dL — ABNORMAL LOW (ref 3.5–5.0)
Anion gap: 12 (ref 5–15)
BUN: 9 mg/dL (ref 8–23)
CO2: 15 mmol/L — ABNORMAL LOW (ref 22–32)
Calcium: 9.4 mg/dL (ref 8.9–10.3)
Chloride: 110 mmol/L (ref 98–111)
Creatinine, Ser: 1.34 mg/dL — ABNORMAL HIGH (ref 0.44–1.00)
GFR calc Af Amer: 49 mL/min — ABNORMAL LOW (ref >60)
GFR calc non Af Amer: 43 mL/min — ABNORMAL LOW (ref >60)
Glucose, Bld: 127 mg/dL — ABNORMAL HIGH (ref 70–99)
Phosphorus: 2.7 mg/dL (ref 2.5–4.6)
Potassium: 3.6 mmol/L (ref 3.5–5.1)
Sodium: 137 mmol/L (ref 135–145)

## 2019-05-27 MED ORDER — INFLUENZA VAC SPLIT QUAD 0.5 ML IM SUSY
0.5000 mL | PREFILLED_SYRINGE | INTRAMUSCULAR | Status: AC
  Administered 2019-05-28: 0.5 mL via INTRAMUSCULAR
  Filled 2019-05-27: qty 0.5

## 2019-05-27 NOTE — Progress Notes (Signed)
Neurosurgery Service Progress Note  Subjective: No acute events overnight, Tmax 37.0, no drainage, failed clamp trial again yesterday  Objective: Vitals:   05/27/19 0500 05/27/19 0600 05/27/19 0700 05/27/19 0800  BP: 132/72 117/71 114/78 119/72  Pulse: 86 85 85 87  Resp: 13 14 14 15   Temp:    98.6 F (37 C)  TempSrc:    Oral  SpO2: 100% 100% 100% 99%  Weight:      Height:       Temp (24hrs), Avg:98.4 F (36.9 C), Min:98 F (36.7 C), Max:98.6 F (37 C)  CBC Latest Ref Rng & Units 05/25/2019 05/24/2019 05/23/2019  WBC 4.0 - 10.5 K/uL 5.9 6.4 6.2  Hemoglobin 12.0 - 15.0 g/dL 8.5(L) 7.6(L) 7.8(L)  Hematocrit 36.0 - 46.0 % 28.8(L) 24.5(L) 26.5(L)  Platelets 150 - 400 K/uL 273 252 264   BMP Latest Ref Rng & Units 05/27/2019 05/26/2019 05/25/2019  Glucose 70 - 99 mg/dL 127(H) 125(H) 147(H)  BUN 8 - 23 mg/dL 9 10 12   Creatinine 0.44 - 1.00 mg/dL 1.34(H) 1.40(H) 1.46(H)  Sodium 135 - 145 mmol/L 137 138 137  Potassium 3.5 - 5.1 mmol/L 3.6 4.4 3.7  Chloride 98 - 111 mmol/L 110 109 107  CO2 22 - 32 mmol/L 15(L) 17(L) 18(L)  Calcium 8.9 - 10.3 mg/dL 9.4 9.4 9.4    Intake/Output Summary (Last 24 hours) at 05/27/2019 0828 Last data filed at 05/27/2019 0800 Gross per 24 hour  Intake 2120.79 ml  Output 2249 ml  Net -128.21 ml    Current Facility-Administered Medications:  .  0.9 %  sodium chloride infusion, , Intravenous, PRN, Newman Pies, MD, Stopped at 05/25/19 1657 .  0.9 %  sodium chloride infusion, , Intravenous, Continuous, Newman Pies, MD, Last Rate: 75 mL/hr at 05/27/19 0800 .  acetaminophen (TYLENOL) tablet 650 mg, 650 mg, Oral, Q4H PRN, 650 mg at 05/24/19 2205 **OR** acetaminophen (TYLENOL) suppository 650 mg, 650 mg, Rectal, Q4H PRN, Newman Pies, MD, 650 mg at 05/16/19 0746 .  alum & mag hydroxide-simeth (MAALOX/MYLANTA) 200-200-20 MG/5ML suspension 30 mL, 30 mL, Oral, Q6H PRN, Newman Pies, MD, 30 mL at 05/05/19 1523 .  amLODipine (NORVASC) tablet 10 mg, 10  mg, Oral, Daily, Newman Pies, MD, 10 mg at 05/26/19 0951 .  atorvastatin (LIPITOR) tablet 40 mg, 40 mg, Oral, Daily, Newman Pies, MD, 40 mg at 05/26/19 1720 .  ceFEPIme (MAXIPIME) 2 g in sodium chloride 0.9 % 100 mL IVPB, 2 g, Intravenous, Q12H, Golden Circle, FNP, Stopped at 05/27/19 [phone] .  Chlorhexidine Gluconate Cloth 2 % PADS 6 each, 6 each, Topical, Daily, Newman Pies, MD, 6 each at 05/26/19 1057 .  docusate sodium (COLACE) capsule 100 mg, 100 mg, Oral, BID, Newman Pies, MD, 100 mg at 05/26/19 2109 .  feeding supplement (ENSURE ENLIVE) (ENSURE ENLIVE) liquid 237 mL, 237 mL, Oral, TID BM, Judith Part, MD, 237 mL at 05/26/19 2039 .  Fluorescein Sodium 10 % injection 500 mg, 500 mg, Intravenous, Once, Newman Pies, MD .  gabapentin (NEURONTIN) capsule 300 mg, 300 mg, Oral, BID, Newman Pies, MD, 300 mg at 05/26/19 2109 .  Gerhardt's butt cream, , Topical, BID, Newman Pies, MD .  heparin injection 5,000 Units, 5,000 Units, Subcutaneous, Q8H, Judith Part, MD, 5,000 Units at 05/27/19 [phone]  HYDROcodone-acetaminophen (NORCO/VICODIN) 5-325 MG per tablet 1 tablet, 1 tablet, Oral, Q4H PRN, Newman Pies, MD, 1 tablet at 05/19/19 0236 .  HYDROmorphone (DILAUDID) injection 0.5 mg, 0.5 mg, Intravenous, Q3H PRN,  Newman Pies, MD, 0.5 mg at 04/26/19 [id number] .  insulin aspart (novoLOG) injection 0-20 Units, 0-20 Units, Subcutaneous, TID WC, Newman Pies, MD, 3 Units at 05/26/19 1235 .  insulin aspart (novoLOG) injection 0-5 Units, 0-5 Units, Subcutaneous, QHS, Newman Pies, MD, 2 Units at 05/16/19 2126 .  iron polysaccharides (NIFEREX) capsule 150 mg, 150 mg, Oral, BID, Newman Pies, MD, 150 mg at 05/26/19 2110 .  labetalol (NORMODYNE) injection 10-40 mg, 10-40 mg, Intravenous, Q10 min PRN, Newman Pies, MD, 20 mg at 05/20/19 0310 .  lidocaine (PF) (XYLOCAINE) 1 % injection, , , PRN, Jacqulynn Cadet, MD, 10 mL at 04/19/19 1436 .   linezolid (ZYVOX) tablet 600 mg, 600 mg, Oral, Q12H, Golden Circle, FNP, 600 mg at 05/26/19 2110 .  lip balm (CARMEX) ointment, , Topical, PRN, Judith Part, MD .  ondansetron (ZOFRAN) tablet 4 mg, 4 mg, Oral, Q4H PRN, 4 mg at 04/30/19 1043 **OR** ondansetron (ZOFRAN) injection 4 mg, 4 mg, Intravenous, Q4H PRN, Newman Pies, MD, 4 mg at 05/16/19 0910 .  ondansetron (ZOFRAN) tablet 4 mg, 4 mg, Oral, Q6H PRN **OR** ondansetron (ZOFRAN) injection 4 mg, 4 mg, Intravenous, Q6H PRN, Newman Pies, MD, 4 mg at 05/05/19 0941 .  pantoprazole (PROTONIX) EC tablet 40 mg, 40 mg, Oral, Daily, Newman Pies, MD, 40 mg at 05/26/19 0951 .  polyethylene glycol (MIRALAX / GLYCOLAX) packet 17 g, 17 g, Oral, Daily PRN, Newman Pies, MD .  promethazine [facility]) injection 12.5-25 mg, 12.5-25 mg, Intravenous, Q4H PRN, Newman Pies, MD, 25 mg at 05/15/19 2239 .  promethazine (PHENERGAN) tablet 12.5-25 mg, 12.5-25 mg, Oral, Q4H PRN, Newman Pies, MD .  senna [facility]) tablet 8.6 mg, 1 tablet, Oral, BID, Newman Pies, MD, 8.6 mg at 05/26/19 2109 .  sodium chloride (OCEAN) 0.65 % nasal spray 4 spray, 4 spray, Each Nare, PRN, Newman Pies, MD .  tiZANidine (ZANAFLEX) tablet 4 mg, 4 mg, Oral, Q8H PRN, Newman Pies, MD, 4 mg at 04/20/19 1554   Physical Exam: Awake, Ox3, PERRL, EOMI, FS, Strength 5/5 x4, SILTx4, VFF with mild bitemporal hemianopsia No rhinorrhea on exam this morning Abdominal incision packed on R, healing well on L  Assessment & Plan: 63 y.o. 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 Failed bedside lumbar drain placement. 8/11 LD placed under fluoro, minimal output, 8/14 LD removed; 8/16 rhinorrhea, LD replaced, 8/19 somnolence, CTH w/ large volume pneumocephalus, LD clamped, 8/21 s/p  repeat endonasal skull base reconstruction, 8/25 rpt CTH with resolving pneumocephalus, febrile started on vanc/cefepime  -cont local wound care to R abdominal wound -failed multiple clamp trials w/ CT evidence of hydro, will place shunt Monday or Tuesday of next week if she continues to be afebrile -cont LD @ 15cc/hr over the weekend -cont qd RFPs, CBCs -SCDs/TEDs/SQH  Judith Part  05/27/19 8:28 AM

## 2019-05-27 NOTE — Progress Notes (Signed)
[facility] for Infectious Disease  Date of Admission:  04/15/2019     Total days of antibiotics 12         ASSESSMENT:  [name] has remained afebrile over the past 72 hours and has failed two attempts at lumbar drain clamping likely to require shunt placement by Neurosurgery which is tentatively planned for early next week. Clinically she has no evidence of meningitis or other infection at present and will continue with plan to end antimicrobial therapy today. ID will be available as needed for the remainder of hospitalization.   PLAN:  1. Continue levofloxacin, cefepime and linezolid through today. 2. Drain management and need for VP shunt per Neurosurgery  Principal Problem:   Fever Active Problems:   Acute bacterial meningitis   Diabetes mellitus type II, non insulin dependent (HCC)   AKI (acute kidney injury) ([place])   Pituitary adenoma (HCC)   CSF leak   . amLODipine  10 mg Oral Daily  . atorvastatin  40 mg Oral Daily  . Chlorhexidine Gluconate Cloth  6 each Topical Daily  . docusate sodium  100 mg Oral BID  . feeding supplement (ENSURE ENLIVE)  237 mL Oral TID BM  . Fluorescein Sodium  500 mg Intravenous Once  . gabapentin  300 mg Oral BID  . Gerhardt's butt cream   Topical BID  . heparin injection (subcutaneous)  5,000 Units Subcutaneous Q8H  . insulin aspart  0-20 Units Subcutaneous TID WC  . insulin aspart  0-5 Units Subcutaneous QHS  . iron polysaccharides  150 mg Oral BID  . linezolid  600 mg Oral Q12H  . pantoprazole  40 mg Oral Daily  . senna  1 tablet Oral BID    SUBJECTIVE:  Afebrile overnight with no acute events. Has failed 2 attempts at drain clamps and planning for shunt placement next week. Denies headaches, neck pain or stiffness.  No Known Allergies   Review of Systems: Review of Systems  Constitutional: Negative for chills, fever and weight loss.  Respiratory: Negative for cough, shortness of breath and wheezing.    Cardiovascular: Negative for chest pain and leg swelling.  Gastrointestinal: Negative for abdominal pain, constipation, diarrhea, nausea and vomiting.  Skin: Negative for rash.      OBJECTIVE: Vitals:   05/27/19 0800 05/27/19 0900 05/27/19 1000 05/27/19 1100  BP: 119/72 101/66 114/70 118/74  Pulse: 87 88 86 85  Resp: 15 13 13 13   Temp: 98.6 F (37 C)     TempSrc: Oral     SpO2: 99% 100% 100% 99%  Weight:      Height:       Body mass index is 38.25 kg/m.  Physical Exam Constitutional:      General: She is not in acute distress.    Appearance: She is well-developed.  Neck:     Musculoskeletal: Normal range of motion and neck supple. No neck rigidity or muscular tenderness.  Cardiovascular:     Rate and Rhythm: Normal rate and regular rhythm.     Heart sounds: Normal heart sounds.  Pulmonary:     Effort: Pulmonary effort is normal.     Breath sounds: Normal breath sounds.  Lymphadenopathy:     Cervical: No cervical adenopathy.  Skin:    General: Skin is warm and dry.  Neurological:     Mental Status: She is alert and oriented to person, place, and time.  Psychiatric:        Mood and Affect: Mood normal.  Lab Results Lab Results  Component Value Date   WBC 5.9 05/25/2019   HGB 8.5 (L) 05/25/2019   HCT 28.8 (L) 05/25/2019   MCV 87.8 05/25/2019   PLT 273 05/25/2019    Lab Results  Component Value Date   CREATININE 1.34 (H) 05/27/2019   BUN 9 05/27/2019   NA 137 05/27/2019   K 3.6 05/27/2019   CL 110 05/27/2019   CO2 15 (L) 05/27/2019    Lab Results  Component Value Date   ALT 25 10/28/2018   AST 41 10/28/2018   ALKPHOS 88 10/28/2018   BILITOT 0.7 10/28/2018     Microbiology: Recent Results (from the past 240 hour(s))  CSF culture with Stat gram stain     Status: None   Collection Time: 05/18/19  9:07 AM   Specimen: CSF; Cerebrospinal Fluid  Result Value Ref Range Status   Specimen Description CSF  Final   Special Requests NONE  Final    Gram Stain   Final    WBC PRESENT,BOTH PMN AND MONONUCLEAR NO ORGANISMS SEEN CYTOSPIN SMEAR    Culture   Final    NO GROWTH 3 DAYS Performed at [facility], [address], [place], [place] [address]    Report Status 05/21/2019 FINAL  Final  Culture, blood (routine x 2)     Status: None   Collection Time: 05/19/19  2:49 PM   Specimen: BLOOD  Result Value Ref Range Status   Specimen Description BLOOD RIGHT ANTECUBITAL  Final   Special Requests   Final    BOTTLES DRAWN AEROBIC ONLY Blood Culture adequate volume   Culture   Final    NO GROWTH 5 DAYS Performed at [facility], [place] [address], [place], [place] [address]    Report Status 05/24/2019 FINAL  Final  Culture, blood (routine x 2)     Status: None   Collection Time: 05/19/19  2:49 PM   Specimen: BLOOD RIGHT ARM  Result Value Ref Range Status   Specimen Description BLOOD RIGHT ARM  Final   Special Requests   Final    BOTTLES DRAWN AEROBIC ONLY Blood Culture adequate volume   Culture   Final    NO GROWTH 5 DAYS Performed at [facility], [place] [address], [place], [place] [address]    Report Status 05/24/2019 FINAL  Final  Culture, blood (routine x 2)     Status: None   Collection Time: 05/21/19  3:30 PM   Specimen: BLOOD LEFT HAND  Result Value Ref Range Status   Specimen Description BLOOD LEFT HAND  Final   Special Requests   Final    AEROBIC BOTTLE ONLY Blood Culture results may not be optimal due to an inadequate volume of blood received in culture bottles   Culture   Final    NO GROWTH 5 DAYS Performed at [facility], [place] [address] [place], [place] [address]    Report Status 05/26/2019 FINAL  Final  Culture, blood (routine x 2)     Status: None   Collection Time: 05/21/19  3:35 PM   Specimen: BLOOD LEFT WRIST  Result Value Ref Range Status   Specimen Description BLOOD LEFT WRIST  Final   Special Requests   Final    AEROBIC BOTTLE ONLY Blood Culture results may not be optimal due to  an inadequate volume of blood received in culture bottles   Culture   Final    NO GROWTH 5 DAYS  Performed at [facility], [place] [address], [place], [place] [address]    Report Status 05/26/2019 FINAL  Final     Terri Piedra, NP [facility] [phone] Pager  05/27/2019  11:24 AM

## 2019-05-28 LAB — RENAL FUNCTION PANEL
Albumin: 3 g/dL — ABNORMAL LOW (ref 3.5–5.0)
Anion gap: 9 (ref 5–15)
BUN: 8 mg/dL (ref 8–23)
CO2: 18 mmol/L — ABNORMAL LOW (ref 22–32)
Calcium: 9.5 mg/dL (ref 8.9–10.3)
Chloride: 109 mmol/L (ref 98–111)
Creatinine, Ser: 1.4 mg/dL — ABNORMAL HIGH (ref 0.44–1.00)
GFR calc Af Amer: 47 mL/min — ABNORMAL LOW (ref >60)
GFR calc non Af Amer: 40 mL/min — ABNORMAL LOW (ref >60)
Glucose, Bld: 129 mg/dL — ABNORMAL HIGH (ref 70–99)
Phosphorus: 2.9 mg/dL (ref 2.5–4.6)
Potassium: 3.6 mmol/L (ref 3.5–5.1)
Sodium: 136 mmol/L (ref 135–145)

## 2019-05-28 LAB — GLUCOSE, CAPILLARY
Glucose-Capillary: 120 mg/dL — ABNORMAL HIGH (ref 70–99)
Glucose-Capillary: 124 mg/dL — ABNORMAL HIGH (ref 70–99)
Glucose-Capillary: 171 mg/dL — ABNORMAL HIGH (ref 70–99)
Glucose-Capillary: 125 mg/dL — ABNORMAL HIGH (ref 70–99)

## 2019-05-28 NOTE — Progress Notes (Signed)
Neurosurgery Service Progress Note  Subjective: No acute events overnight, Tmax 37.2, no drainage  Objective: Vitals:   05/28/19 0400 05/28/19 0500 05/28/19 0600 05/28/19 0700  BP: 138/76 135/73 (!) 141/85 128/83  Pulse: 87 88 90 87  Resp: 14 13 14 14   Temp: 98.5 F (36.9 C)     TempSrc: Axillary     SpO2: 100% 99% 99% 100%  Weight:      Height:       Temp (24hrs), Avg:98.7 F (37.1 C), Min:98.5 F (36.9 C), Max:98.9 F (37.2 C)  CBC Latest Ref Rng & Units 05/25/2019 05/24/2019 05/23/2019  WBC 4.0 - 10.5 K/uL 5.9 6.4 6.2  Hemoglobin 12.0 - 15.0 g/dL 8.5(L) 7.6(L) 7.8(L)  Hematocrit 36.0 - 46.0 % 28.8(L) 24.5(L) 26.5(L)  Platelets 150 - 400 K/uL 273 252 264   BMP Latest Ref Rng & Units 05/28/2019 05/27/2019 05/26/2019  Glucose 70 - 99 mg/dL 129(H) 127(H) 125(H)  BUN 8 - 23 mg/dL 8 9 10   Creatinine 0.44 - 1.00 mg/dL 1.40(H) 1.34(H) 1.40(H)  Sodium 135 - 145 mmol/L 136 137 138  Potassium 3.5 - 5.1 mmol/L 3.6 3.6 4.4  Chloride 98 - 111 mmol/L 109 110 109  CO2 22 - 32 mmol/L 18(L) 15(L) 17(L)  Calcium 8.9 - 10.3 mg/dL 9.5 9.4 9.4    Intake/Output Summary (Last 24 hours) at 05/28/2019 0837 Last data filed at 05/28/2019 0700 Gross per 24 hour  Intake 2069.58 ml  Output 533 ml  Net 1536.58 ml    Current Facility-Administered Medications:  .  0.9 %  sodium chloride infusion, , Intravenous, PRN, Newman Pies, MD, Stopped at 05/25/19 1657 .  0.9 %  sodium chloride infusion, , Intravenous, Continuous, Newman Pies, MD, Last Rate: 75 mL/hr at 05/28/19 0700 .  acetaminophen (TYLENOL) tablet 650 mg, 650 mg, Oral, Q4H PRN, 650 mg at 05/24/19 2205 **OR** acetaminophen (TYLENOL) suppository 650 mg, 650 mg, Rectal, Q4H PRN, Newman Pies, MD, 650 mg at 05/16/19 0746 .  alum & mag hydroxide-simeth (MAALOX/MYLANTA) 200-200-20 MG/5ML suspension 30 mL, 30 mL, Oral, Q6H PRN, Newman Pies, MD, 30 mL at 05/05/19 1523 .  amLODipine (NORVASC) tablet 10 mg, 10 mg, Oral, Daily, Newman Pies, MD, 10 mg at 05/27/19 1100 .  atorvastatin (LIPITOR) tablet 40 mg, 40 mg, Oral, Daily, Newman Pies, MD, 40 mg at 05/27/19 1806 .  Chlorhexidine Gluconate Cloth 2 % PADS 6 each, 6 each, Topical, Daily, Newman Pies, MD, 6 each at 05/26/19 1057 .  docusate sodium (COLACE) capsule 100 mg, 100 mg, Oral, BID, Newman Pies, MD, 100 mg at 05/27/19 2249 .  feeding supplement (ENSURE ENLIVE) (ENSURE ENLIVE) liquid 237 mL, 237 mL, Oral, TID BM, Judith Part, MD, 237 mL at 05/26/19 2039 .  Fluorescein Sodium 10 % injection 500 mg, 500 mg, Intravenous, Once, Newman Pies, MD .  gabapentin (NEURONTIN) capsule 300 mg, 300 mg, Oral, BID, Newman Pies, MD, 300 mg at 05/27/19 2249 .  Gerhardt's butt cream, , Topical, BID, Newman Pies, MD .  heparin injection 5,000 Units, 5,000 Units, Subcutaneous, Q8H, Judith Part, MD, 5,000 Units at 05/28/19 [phone] .  HYDROcodone-acetaminophen (NORCO/VICODIN) 5-325 MG per tablet 1 tablet, 1 tablet, Oral, Q4H PRN, Newman Pies, MD, 1 tablet at 05/19/19 0236 .  HYDROmorphone (DILAUDID) injection 0.5 mg, 0.5 mg, Intravenous, Q3H PRN, Newman Pies, MD, 0.5 mg at 04/26/19 0939 .  influenza vac split quadrivalent PF (FLUARIX) injection 0.5 mL, 0.5 mL, Intramuscular, Tomorrow-1000, Laysha Childers, Joyice Faster, MD .  insulin  aspart (novoLOG) injection 0-20 Units, 0-20 Units, Subcutaneous, TID WC, Newman Pies, MD, 3 Units at 05/27/19 0840 .  insulin aspart (novoLOG) injection 0-5 Units, 0-5 Units, Subcutaneous, QHS, Newman Pies, MD, 2 Units at 05/16/19 2126 .  iron polysaccharides (NIFEREX) capsule 150 mg, 150 mg, Oral, BID, Newman Pies, MD, 150 mg at 05/27/19 2249 .  labetalol (NORMODYNE) injection 10-40 mg, 10-40 mg, Intravenous, Q10 min PRN, Newman Pies, MD, 20 mg at 05/20/19 0310 .  lidocaine (PF) (XYLOCAINE) 1 % injection, , , PRN, Jacqulynn Cadet, MD, 10 mL at 04/19/19 1436 .  lip balm (CARMEX) ointment, , Topical,  PRN, Judith Part, MD .  ondansetron (ZOFRAN) tablet 4 mg, 4 mg, Oral, Q4H PRN, 4 mg at 04/30/19 1043 **OR** ondansetron (ZOFRAN) injection 4 mg, 4 mg, Intravenous, Q4H PRN, Newman Pies, MD, 4 mg at 05/16/19 0910 .  ondansetron (ZOFRAN) tablet 4 mg, 4 mg, Oral, Q6H PRN **OR** ondansetron (ZOFRAN) injection 4 mg, 4 mg, Intravenous, Q6H PRN, Newman Pies, MD, 4 mg at 05/05/19 0941 .  pantoprazole (PROTONIX) EC tablet 40 mg, 40 mg, Oral, Daily, Newman Pies, MD, 40 mg at 05/27/19 1100 .  polyethylene glycol (MIRALAX / GLYCOLAX) packet 17 g, 17 g, Oral, Daily PRN, Newman Pies, MD .  promethazine [facility]) injection 12.5-25 mg, 12.5-25 mg, Intravenous, Q4H PRN, Newman Pies, MD, 25 mg at 05/15/19 2239 .  promethazine (PHENERGAN) tablet 12.5-25 mg, 12.5-25 mg, Oral, Q4H PRN, Newman Pies, MD .  senna [facility]) tablet 8.6 mg, 1 tablet, Oral, BID, Newman Pies, MD, 8.6 mg at 05/27/19 2249 .  sodium chloride (OCEAN) 0.65 % nasal spray 4 spray, 4 spray, Each Nare, PRN, Newman Pies, MD .  tiZANidine (ZANAFLEX) tablet 4 mg, 4 mg, Oral, Q8H PRN, Newman Pies, MD, 4 mg at 04/20/19 1554   Physical Exam: Awake, Ox3, PERRL, EOMI, FS, Strength 5/5 x4, SILTx4, VFF with mild bitemporal hemianopsia No rhinorrhea on exam this morning Abdominal incision packed on R, healing well on L  Assessment & Plan: 63 y.o. 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 Failed bedside lumbar drain placement. 8/11 LD placed under fluoro, minimal output, 8/14 LD removed; 8/16 rhinorrhea, LD replaced, 8/19 somnolence, CTH w/ large volume pneumocephalus, LD clamped, 8/21 s/p repeat endonasal skull base reconstruction, 8/25 rpt CTH with resolving pneumocephalus, febrile started on vanc/cefepime  -cont local wound care  to R abdominal wound -failed multiple clamp trials w/ CT evidence of hydro, will place R VP shunt Tuesday -cont LD @ 15cc/hr over the weekend -cont qd RFPs -[place]  05/28/19 8:37 AM

## 2019-05-28 NOTE — Progress Notes (Signed)
Pt lumbar drain had 0 output from 0000 to 0300. Pt neuro exam unchanged. Pt received a bath shortly before 0000,which was last recorded output. RN dropped the drain lower and raised the bed with no increase in output. RN attempted to troubleshoot drain, and upon further assessment discovered tubing was kinked underneath pt. RN had pt roll and corrected tubing orientation. Will continue to monitor. Fortino Sic, RN, BSN 05/28/2019 3:22 AM

## 2019-05-29 LAB — RENAL FUNCTION PANEL
Albumin: 2.8 g/dL — ABNORMAL LOW (ref 3.5–5.0)
Anion gap: 10 (ref 5–15)
BUN: 9 mg/dL (ref 8–23)
CO2: 17 mmol/L — ABNORMAL LOW (ref 22–32)
Calcium: 9.3 mg/dL (ref 8.9–10.3)
Chloride: 110 mmol/L (ref 98–111)
Creatinine, Ser: 1.31 mg/dL — ABNORMAL HIGH (ref 0.44–1.00)
GFR calc Af Amer: 51 mL/min — ABNORMAL LOW (ref >60)
GFR calc non Af Amer: 44 mL/min — ABNORMAL LOW (ref >60)
Glucose, Bld: 140 mg/dL — ABNORMAL HIGH (ref 70–99)
Phosphorus: 3.3 mg/dL (ref 2.5–4.6)
Potassium: 3.4 mmol/L — ABNORMAL LOW (ref 3.5–5.1)
Sodium: 137 mmol/L (ref 135–145)

## 2019-05-29 LAB — GLUCOSE, CAPILLARY
Glucose-Capillary: 126 mg/dL — ABNORMAL HIGH (ref 70–99)
Glucose-Capillary: 134 mg/dL — ABNORMAL HIGH (ref 70–99)
Glucose-Capillary: 165 mg/dL — ABNORMAL HIGH (ref 70–99)
Glucose-Capillary: 150 mg/dL — ABNORMAL HIGH (ref 70–99)

## 2019-05-29 NOTE — Progress Notes (Signed)
Neurosurgery Service Progress Note  Subjective: No acute events overnight, Tmax 37.2 off Abx  Objective: Vitals:   05/29/19 0500 05/29/19 0600 05/29/19 0700 05/29/19 0800  BP: 121/67 94/63 114/71 (!) 96/58  Pulse: 85 86 87 89  Resp: 14 15 14 13   Temp:      TempSrc:      SpO2: 99% 97% 98% 94%  Weight:      Height:       Temp (24hrs), Avg:98.6 F (37 C), Min:98.4 F (36.9 C), Max:98.9 F (37.2 C)  CBC Latest Ref Rng & Units 05/25/2019 05/24/2019 05/23/2019  WBC 4.0 - 10.5 K/uL 5.9 6.4 6.2  Hemoglobin 12.0 - 15.0 g/dL 8.5(L) 7.6(L) 7.8(L)  Hematocrit 36.0 - 46.0 % 28.8(L) 24.5(L) 26.5(L)  Platelets 150 - 400 K/uL 273 252 264   BMP Latest Ref Rng & Units 05/29/2019 05/28/2019 05/27/2019  Glucose 70 - 99 mg/dL 140(H) 129(H) 127(H)  BUN 8 - 23 mg/dL 9 8 9   Creatinine 0.44 - 1.00 mg/dL 1.31(H) 1.40(H) 1.34(H)  Sodium 135 - 145 mmol/L 137 136 137  Potassium 3.5 - 5.1 mmol/L 3.4(L) 3.6 3.6  Chloride 98 - 111 mmol/L 110 109 110  CO2 22 - 32 mmol/L 17(L) 18(L) 15(L)  Calcium 8.9 - 10.3 mg/dL 9.3 9.5 9.4    Intake/Output Summary (Last 24 hours) at 05/29/2019 0853 Last data filed at 05/29/2019 0838 Gross per 24 hour  Intake 2277.78 ml  Output 1146 ml  Net 1131.78 ml    Current Facility-Administered Medications:  .  0.9 %  sodium chloride infusion, , Intravenous, PRN, Newman Pies, MD, Stopped at 05/25/19 1657 .  0.9 %  sodium chloride infusion, , Intravenous, Continuous, Newman Pies, MD, Last Rate: 75 mL/hr at 05/29/19 [phone]  acetaminophen (TYLENOL) tablet 650 mg, 650 mg, Oral, Q4H PRN, 650 mg at 05/24/19 2205 **OR** acetaminophen (TYLENOL) suppository 650 mg, 650 mg, Rectal, Q4H PRN, Newman Pies, MD, 650 mg at 05/16/19 0746 .  alum & mag hydroxide-simeth (MAALOX/MYLANTA) 200-200-20 MG/5ML suspension 30 mL, 30 mL, Oral, Q6H PRN, Newman Pies, MD, 30 mL at 05/05/19 1523 .  amLODipine (NORVASC) tablet 10 mg, 10 mg, Oral, Daily, Newman Pies, MD, 10 mg at 05/28/19  1055 .  atorvastatin (LIPITOR) tablet 40 mg, 40 mg, Oral, Daily, Newman Pies, MD, 40 mg at 05/28/19 1723 .  Chlorhexidine Gluconate Cloth 2 % PADS 6 each, 6 each, Topical, Daily, Newman Pies, MD, 6 each at 05/28/19 1300 .  docusate sodium (COLACE) capsule 100 mg, 100 mg, Oral, BID, Newman Pies, MD, 100 mg at 05/28/19 1055 .  feeding supplement (ENSURE ENLIVE) (ENSURE ENLIVE) liquid 237 mL, 237 mL, Oral, TID BM, Cinthia Rodden A, MD, 237 mL at 05/28/19 1055 .  Fluorescein Sodium 10 % injection 500 mg, 500 mg, Intravenous, Once, Newman Pies, MD .  gabapentin (NEURONTIN) capsule 300 mg, 300 mg, Oral, BID, Newman Pies, MD, 300 mg at 05/28/19 2240 .  Gerhardt's butt cream, , Topical, BID, Newman Pies, MD, 1 application at [id number] .  heparin injection 5,000 Units, 5,000 Units, Subcutaneous, Q8H, Judith Part, MD, 5,000 Units at 05/29/19 [phone]  HYDROcodone-acetaminophen (NORCO/VICODIN) 5-325 MG per tablet 1 tablet, 1 tablet, Oral, Q4H PRN, Newman Pies, MD, 1 tablet at 05/19/19 0236 .  HYDROmorphone (DILAUDID) injection 0.5 mg, 0.5 mg, Intravenous, Q3H PRN, Newman Pies, MD, 0.5 mg at 04/26/19 0939 .  insulin aspart (novoLOG) injection 0-20 Units, 0-20 Units, Subcutaneous, TID WC, Newman Pies, MD, 3 Units at 05/29/19  [id number] .  insulin aspart (novoLOG) injection 0-5 Units, 0-5 Units, Subcutaneous, QHS, Newman Pies, MD, 2 Units at 05/16/19 2126 .  iron polysaccharides (NIFEREX) capsule 150 mg, 150 mg, Oral, BID, Newman Pies, MD, 150 mg at 05/28/19 2240 .  labetalol (NORMODYNE) injection 10-40 mg, 10-40 mg, Intravenous, Q10 min PRN, Newman Pies, MD, 20 mg at 05/20/19 0310 .  lidocaine (PF) (XYLOCAINE) 1 % injection, , , PRN, Jacqulynn Cadet, MD, 10 mL at 04/19/19 1436 .  lip balm (CARMEX) ointment, , Topical, PRN, Judith Part, MD .  ondansetron (ZOFRAN) tablet 4 mg, 4 mg, Oral, Q4H PRN, 4 mg at 04/30/19 1043 **OR** ondansetron  (ZOFRAN) injection 4 mg, 4 mg, Intravenous, Q4H PRN, Newman Pies, MD, 4 mg at 05/16/19 0910 .  ondansetron (ZOFRAN) tablet 4 mg, 4 mg, Oral, Q6H PRN **OR** ondansetron (ZOFRAN) injection 4 mg, 4 mg, Intravenous, Q6H PRN, Newman Pies, MD, 4 mg at 05/05/19 0941 .  pantoprazole (PROTONIX) EC tablet 40 mg, 40 mg, Oral, Daily, Newman Pies, MD, 40 mg at 05/28/19 1055 .  polyethylene glycol (MIRALAX / GLYCOLAX) packet 17 g, 17 g, Oral, Daily PRN, Newman Pies, MD .  promethazine [facility]) injection 12.5-25 mg, 12.5-25 mg, Intravenous, Q4H PRN, Newman Pies, MD, 25 mg at 05/15/19 2239 .  promethazine (PHENERGAN) tablet 12.5-25 mg, 12.5-25 mg, Oral, Q4H PRN, Newman Pies, MD .  senna [facility]) tablet 8.6 mg, 1 tablet, Oral, BID, Newman Pies, MD, 8.6 mg at 05/28/19 1055 .  sodium chloride (OCEAN) 0.65 % nasal spray 4 spray, 4 spray, Each Nare, PRN, Newman Pies, MD .  tiZANidine (ZANAFLEX) tablet 4 mg, 4 mg, Oral, Q8H PRN, Newman Pies, MD, 4 mg at 04/20/19 1554   Physical Exam: Awake, Ox3, PERRL, EOMI, FS, Strength 5/5 x4, SILTx4, VFF with mild bitemporal hemianopsia No rhinorrhea on exam this morning Abdominal incision packed on R, healing well on L  Assessment & Plan: 63 y.o. 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 Failed bedside lumbar drain placement. 8/11 LD placed under fluoro, minimal output, 8/14 LD removed; 8/16 rhinorrhea, LD replaced, 8/19 somnolence, CTH w/ large volume pneumocephalus, LD clamped, 8/21 s/p repeat endonasal skull base reconstruction, 8/25 rpt CTH with resolving pneumocephalus, febrile started on vanc/cefepime, 9/19 finished course of ABx  -cont local wound care to R abdominal wound -failed multiple clamp trials w/ CT evidence of hydro, will place R VP shunt  Tuesday -cont LD @ 15cc/hr over the weekend -renal function stabilized, can space out RFPs to qod -[place]  05/29/19 8:53 AM

## 2019-05-30 LAB — RENAL FUNCTION PANEL
Albumin: 2.8 g/dL — ABNORMAL LOW (ref 3.5–5.0)
Anion gap: 9 (ref 5–15)
BUN: 7 mg/dL — ABNORMAL LOW (ref 8–23)
CO2: 18 mmol/L — ABNORMAL LOW (ref 22–32)
Calcium: 9.3 mg/dL (ref 8.9–10.3)
Chloride: 112 mmol/L — ABNORMAL HIGH (ref 98–111)
Creatinine, Ser: 1.26 mg/dL — ABNORMAL HIGH (ref 0.44–1.00)
GFR calc Af Amer: 53 mL/min — ABNORMAL LOW (ref >60)
GFR calc non Af Amer: 46 mL/min — ABNORMAL LOW (ref >60)
Glucose, Bld: 176 mg/dL — ABNORMAL HIGH (ref 70–99)
Phosphorus: 3.3 mg/dL (ref 2.5–4.6)
Potassium: 3.3 mmol/L — ABNORMAL LOW (ref 3.5–5.1)
Sodium: 139 mmol/L (ref 135–145)

## 2019-05-30 LAB — GLUCOSE, CAPILLARY
Glucose-Capillary: 134 mg/dL — ABNORMAL HIGH (ref 70–99)
Glucose-Capillary: 166 mg/dL — ABNORMAL HIGH (ref 70–99)
Glucose-Capillary: 111 mg/dL — ABNORMAL HIGH (ref 70–99)
Glucose-Capillary: 138 mg/dL — ABNORMAL HIGH (ref 70–99)

## 2019-05-30 MED ORDER — POTASSIUM CHLORIDE CRYS ER 20 MEQ PO TBCR
40.0000 meq | EXTENDED_RELEASE_TABLET | Freq: Two times a day (BID) | ORAL | Status: AC
  Administered 2019-05-30 (×2): 40 meq via ORAL
  Filled 2019-05-30 (×2): qty 2

## 2019-05-30 NOTE — Progress Notes (Signed)
Neurosurgery Service Progress Note  Subjective: No acute events overnight, Tmax 37.4 off Abx  Objective: Vitals:   05/30/19 0400 05/30/19 0430 05/30/19 0500 05/30/19 0600  BP: (!) 142/84  121/75 (!) 142/85  Pulse: 88  85 88  Resp: 12  14 13   Temp:  99.2 F (37.3 C)    TempSrc:  Axillary    SpO2: 100%  98% 98%  Weight:      Height:       Temp (24hrs), Avg:99 F (37.2 C), Min:98.8 F (37.1 C), Max:99.3 F (37.4 C)  CBC Latest Ref Rng & Units 05/25/2019 05/24/2019 05/23/2019  WBC 4.0 - 10.5 K/uL 5.9 6.4 6.2  Hemoglobin 12.0 - 15.0 g/dL 8.5(L) 7.6(L) 7.8(L)  Hematocrit 36.0 - 46.0 % 28.8(L) 24.5(L) 26.5(L)  Platelets 150 - 400 K/uL 273 252 264   BMP Latest Ref Rng & Units 05/29/2019 05/28/2019 05/27/2019  Glucose 70 - 99 mg/dL 140(H) 129(H) 127(H)  BUN 8 - 23 mg/dL 9 8 9   Creatinine 0.44 - 1.00 mg/dL 1.31(H) 1.40(H) 1.34(H)  Sodium 135 - 145 mmol/L 137 136 137  Potassium 3.5 - 5.1 mmol/L 3.4(L) 3.6 3.6  Chloride 98 - 111 mmol/L 110 109 110  CO2 22 - 32 mmol/L 17(L) 18(L) 15(L)  Calcium 8.9 - 10.3 mg/dL 9.3 9.5 9.4    Intake/Output Summary (Last 24 hours) at 05/30/2019 0730 Last data filed at 05/30/2019 0600 Gross per 24 hour  Intake 2557.1 ml  Output 3120 ml  Net -562.9 ml    Current Facility-Administered Medications:  .  0.9 %  sodium chloride infusion, , Intravenous, PRN, Newman Pies, MD, Stopped at 05/25/19 1657 .  0.9 %  sodium chloride infusion, , Intravenous, Continuous, Newman Pies, MD, Last Rate: 75 mL/hr at 05/30/19 0600 .  acetaminophen (TYLENOL) tablet 650 mg, 650 mg, Oral, Q4H PRN, 650 mg at 05/24/19 2205 **OR** acetaminophen (TYLENOL) suppository 650 mg, 650 mg, Rectal, Q4H PRN, Newman Pies, MD, 650 mg at 05/16/19 0746 .  alum & mag hydroxide-simeth (MAALOX/MYLANTA) 200-200-20 MG/5ML suspension 30 mL, 30 mL, Oral, Q6H PRN, Newman Pies, MD, 30 mL at 05/05/19 1523 .  amLODipine (NORVASC) tablet 10 mg, 10 mg, Oral, Daily, Newman Pies, MD, 10  mg at 05/29/19 1059 .  atorvastatin (LIPITOR) tablet 40 mg, 40 mg, Oral, Daily, Newman Pies, MD, 40 mg at 05/29/19 1740 .  Chlorhexidine Gluconate Cloth 2 % PADS 6 each, 6 each, Topical, Daily, Newman Pies, MD, 6 each at 05/29/19 1500 .  docusate sodium (COLACE) capsule 100 mg, 100 mg, Oral, BID, Newman Pies, MD, 100 mg at 05/29/19 1059 .  feeding supplement (ENSURE ENLIVE) (ENSURE ENLIVE) liquid 237 mL, 237 mL, Oral, TID BM, Berenise Hunton A, MD, 237 mL at 05/29/19 1438 .  Fluorescein Sodium 10 % injection 500 mg, 500 mg, Intravenous, Once, Newman Pies, MD .  gabapentin (NEURONTIN) capsule 300 mg, 300 mg, Oral, BID, Newman Pies, MD, 300 mg at 05/29/19 2232 .  Gerhardt's butt cream, , Topical, BID, Newman Pies, MD, 1 application at [id number] [phone] .  heparin injection 5,000 Units, 5,000 Units, Subcutaneous, Q8H, Judith Part, MD, 5,000 Units at 05/30/19 0640 .  HYDROcodone-acetaminophen (NORCO/VICODIN) 5-325 MG per tablet 1 tablet, 1 tablet, Oral, Q4H PRN, Newman Pies, MD, 1 tablet at 05/19/19 0236 .  HYDROmorphone (DILAUDID) injection 0.5 mg, 0.5 mg, Intravenous, Q3H PRN, Newman Pies, MD, 0.5 mg at 04/26/19 0939 .  insulin aspart (novoLOG) injection 0-20 Units, 0-20 Units, Subcutaneous, TID WC, Newman Pies, MD,  3 Units at 05/29/19 1740 .  insulin aspart (novoLOG) injection 0-5 Units, 0-5 Units, Subcutaneous, QHS, Newman Pies, MD, 2 Units at 05/16/19 2126 .  iron polysaccharides (NIFEREX) capsule 150 mg, 150 mg, Oral, BID, Newman Pies, MD, 150 mg at 05/29/19 2232 .  labetalol (NORMODYNE) injection 10-40 mg, 10-40 mg, Intravenous, Q10 min PRN, Newman Pies, MD, 20 mg at 05/20/19 0310 .  lidocaine (PF) (XYLOCAINE) 1 % injection, , , PRN, Jacqulynn Cadet, MD, 10 mL at 04/19/19 1436 .  lip balm (CARMEX) ointment, , Topical, PRN, Judith Part, MD .  ondansetron (ZOFRAN) tablet 4 mg, 4 mg, Oral, Q4H PRN, 4 mg at 04/30/19 1043  **OR** ondansetron (ZOFRAN) injection 4 mg, 4 mg, Intravenous, Q4H PRN, Newman Pies, MD, 4 mg at 05/16/19 0910 .  ondansetron (ZOFRAN) tablet 4 mg, 4 mg, Oral, Q6H PRN **OR** ondansetron (ZOFRAN) injection 4 mg, 4 mg, Intravenous, Q6H PRN, Newman Pies, MD, 4 mg at 05/05/19 0941 .  pantoprazole (PROTONIX) EC tablet 40 mg, 40 mg, Oral, Daily, Newman Pies, MD, 40 mg at 05/29/19 1059 .  polyethylene glycol (MIRALAX / GLYCOLAX) packet 17 g, 17 g, Oral, Daily PRN, Newman Pies, MD .  promethazine [facility]) injection 12.5-25 mg, 12.5-25 mg, Intravenous, Q4H PRN, Newman Pies, MD, 25 mg at 05/15/19 2239 .  promethazine (PHENERGAN) tablet 12.5-25 mg, 12.5-25 mg, Oral, Q4H PRN, Newman Pies, MD .  senna [facility]) tablet 8.6 mg, 1 tablet, Oral, BID, Newman Pies, MD, 8.6 mg at 05/29/19 1059 .  sodium chloride (OCEAN) 0.65 % nasal spray 4 spray, 4 spray, Each Nare, PRN, Newman Pies, MD .  tiZANidine (ZANAFLEX) tablet 4 mg, 4 mg, Oral, Q8H PRN, Newman Pies, MD, 4 mg at 04/20/19 1554   Physical Exam: Awake, Ox3, PERRL, EOMI, FS, Strength 5/5 x4, SILTx4, VFF with mild bitemporal hemianopsia No rhinorrhea on exam this morning Abdominal incision packed on R, healing well on L  Assessment & Plan: 63 y.o. 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 Failed bedside lumbar drain placement. 8/11 LD placed under fluoro, minimal output, 8/14 LD removed; 8/16 rhinorrhea, LD replaced, 8/19 somnolence, CTH w/ large volume pneumocephalus, LD clamped, 8/21 s/p repeat endonasal skull base reconstruction, 8/25 rpt CTH with resolving pneumocephalus, febrile started on vanc/cefepime, 9/19 finished course of ABx  -OR tomorrow for VP shunt placement -cont local wound care to R abdominal wound -failed multiple clamp  trials w/ CT evidence of hydro -cont LD @ 15cc/hr over the weekend -renal function stabilized, can space out RFPs to qod -SCDs/TEDs/SQH  Marcello Moores A Aviela Blundell  05/30/19 7:30 AM

## 2019-05-31 ENCOUNTER — Inpatient Hospital Stay (HOSPITAL_COMMUNITY): Payer: BC Managed Care – PPO | Admitting: Certified Registered"

## 2019-05-31 ENCOUNTER — Encounter (HOSPITAL_COMMUNITY): Payer: Self-pay | Admitting: Orthopedic Surgery

## 2019-05-31 ENCOUNTER — Inpatient Hospital Stay (HOSPITAL_COMMUNITY): Payer: BC Managed Care – PPO

## 2019-05-31 ENCOUNTER — Encounter (HOSPITAL_COMMUNITY)
Admission: RE | Disposition: A | Discharge status: Rehabilitation Facility | Payer: Self-pay | Source: Home / Self Care | Attending: Neurological Surgery

## 2019-05-31 HISTORY — PX: VENTRICULOPERITONEAL SHUNT: SHX204

## 2019-05-31 LAB — GLUCOSE, RANDOM: Glucose, Bld: 473 mg/dL — ABNORMAL HIGH (ref 70–99)

## 2019-05-31 LAB — GLUCOSE, CAPILLARY
Glucose-Capillary: 149 mg/dL — ABNORMAL HIGH (ref 70–99)
Glucose-Capillary: 122 mg/dL — ABNORMAL HIGH (ref 70–99)
Glucose-Capillary: 165 mg/dL — ABNORMAL HIGH (ref 70–99)
Glucose-Capillary: 457 mg/dL — ABNORMAL HIGH (ref 70–99)
Glucose-Capillary: 483 mg/dL — ABNORMAL HIGH (ref 70–99)

## 2019-05-31 SURGERY — SHUNT INSERTION VENTRICULAR-PERITONEAL
Anesthesia: General | Laterality: Right

## 2019-05-31 MED ORDER — ESMOLOL HCL 100 MG/10ML IV SOLN
INTRAVENOUS | Status: DC | PRN
  Administered 2019-05-31 (×2): 20 mg via INTRAVENOUS

## 2019-05-31 MED ORDER — ROCURONIUM BROMIDE 10 MG/ML (PF) SYRINGE
PREFILLED_SYRINGE | INTRAVENOUS | Status: DC | PRN
  Administered 2019-05-31: 50 mg via INTRAVENOUS

## 2019-05-31 MED ORDER — FENTANYL CITRATE (PF) 250 MCG/5ML IJ SOLN
INTRAMUSCULAR | Status: DC | PRN
  Administered 2019-05-31 (×4): 50 ug via INTRAVENOUS

## 2019-05-31 MED ORDER — SODIUM CHLORIDE 0.9 % IV SOLN
INTRAVENOUS | Status: DC | PRN
  Administered 2019-05-31: 500 mL

## 2019-05-31 MED ORDER — DEXAMETHASONE SODIUM PHOSPHATE 10 MG/ML IJ SOLN
INTRAMUSCULAR | Status: DC | PRN
  Administered 2019-05-31: 5 mg via INTRAVENOUS

## 2019-05-31 MED ORDER — PROPOFOL 10 MG/ML IV BOLUS
INTRAVENOUS | Status: DC | PRN
  Administered 2019-05-31: 120 mg via INTRAVENOUS

## 2019-05-31 MED ORDER — BACITRACIN ZINC 500 UNIT/GM EX OINT
TOPICAL_OINTMENT | CUTANEOUS | Status: AC
  Filled 2019-05-31: qty 28.35

## 2019-05-31 MED ORDER — ACETAMINOPHEN 10 MG/ML IV SOLN: 1000.0000 mg | Freq: Once | INTRAVENOUS | Status: DC | PRN

## 2019-05-31 MED ORDER — INSULIN ASPART 100 UNIT/ML ~~LOC~~ SOLN
10.0000 [IU] | Freq: Once | SUBCUTANEOUS | Status: AC
  Administered 2019-05-31: 22:00:00 10 [IU] via SUBCUTANEOUS

## 2019-05-31 MED ORDER — 0.9 % SODIUM CHLORIDE (POUR BTL) OPTIME
TOPICAL | Status: DC | PRN
  Administered 2019-05-31: 14:00:00 1000 mL

## 2019-05-31 MED ORDER — SODIUM CHLORIDE 0.9 % IV SOLN
INTRAVENOUS | Status: DC | PRN
  Administered 2019-05-31: 15 ug/min via INTRAVENOUS

## 2019-05-31 MED ORDER — FENTANYL CITRATE (PF) 100 MCG/2ML IJ SOLN: 25.0000 ug | INTRAMUSCULAR | Status: DC | PRN

## 2019-05-31 MED ORDER — LIDOCAINE-EPINEPHRINE 1 %-1:100000 IJ SOLN
INTRAMUSCULAR | Status: DC | PRN
  Administered 2019-05-31: 8 mL
  Administered 2019-05-31: 7 mL

## 2019-05-31 MED ORDER — ONDANSETRON HCL 4 MG/2ML IJ SOLN
INTRAMUSCULAR | Status: DC | PRN
  Administered 2019-05-31: 4 mg via INTRAVENOUS

## 2019-05-31 MED ORDER — LIDOCAINE-EPINEPHRINE 1 %-1:100000 IJ SOLN
INTRAMUSCULAR | Status: AC
  Filled 2019-05-31: qty 1

## 2019-05-31 MED ORDER — PROPOFOL 10 MG/ML IV BOLUS
INTRAVENOUS | Status: AC
  Filled 2019-05-31: qty 20

## 2019-05-31 MED ORDER — SUGAMMADEX SODIUM 200 MG/2ML IV SOLN
INTRAVENOUS | Status: DC | PRN
  Administered 2019-05-31: 200 mg via INTRAVENOUS

## 2019-05-31 MED ORDER — OXYCODONE HCL 5 MG PO TABS: 5.0000 mg | ORAL_TABLET | Freq: Once | ORAL | Status: DC | PRN

## 2019-05-31 MED ORDER — THROMBIN 5000 UNITS EX SOLR
CUTANEOUS | Status: AC
  Filled 2019-05-31: qty 5000

## 2019-05-31 MED ORDER — ACETAMINOPHEN 500 MG PO TABS: 1000.0000 mg | ORAL_TABLET | Freq: Once | ORAL | Status: DC | PRN

## 2019-05-31 MED ORDER — CEFAZOLIN SODIUM-DEXTROSE 2-3 GM-%(50ML) IV SOLR
INTRAVENOUS | Status: DC | PRN
  Administered 2019-05-31: 2 g via INTRAVENOUS

## 2019-05-31 MED ORDER — FENTANYL CITRATE (PF) 250 MCG/5ML IJ SOLN
INTRAMUSCULAR | Status: AC
  Filled 2019-05-31: qty 5

## 2019-05-31 MED ORDER — PHENYLEPHRINE HCL (PRESSORS) 10 MG/ML IV SOLN
INTRAVENOUS | Status: DC | PRN
  Administered 2019-05-31: 80 ug via INTRAVENOUS

## 2019-05-31 MED ORDER — THROMBIN 5000 UNITS EX SOLR
OROMUCOSAL | Status: DC | PRN
  Administered 2019-05-31: 5 mL via TOPICAL

## 2019-05-31 MED ORDER — BOOST / RESOURCE BREEZE PO LIQD CUSTOM
1.0000 | Freq: Three times a day (TID) | ORAL | Status: DC
  Administered 2019-05-31 – 2019-06-11 (×25): 1 via ORAL

## 2019-05-31 MED ORDER — SODIUM CHLORIDE 0.9 % IV SOLN
INTRAVENOUS | Status: DC | PRN
  Administered 2019-05-31: 14:00:00 via INTRAVENOUS

## 2019-05-31 MED ORDER — EPHEDRINE 5 MG/ML INJ
INTRAVENOUS | Status: AC
  Filled 2019-05-31: qty 20

## 2019-05-31 MED ORDER — ACETAMINOPHEN 160 MG/5ML PO SOLN: 1000.0000 mg | Freq: Once | ORAL | Status: DC | PRN

## 2019-05-31 MED ORDER — BACITRACIN ZINC 500 UNIT/GM EX OINT
TOPICAL_OINTMENT | CUTANEOUS | Status: DC | PRN
  Administered 2019-05-31: 1 via TOPICAL

## 2019-05-31 MED ORDER — LIDOCAINE 2% (20 MG/ML) 5 ML SYRINGE
INTRAMUSCULAR | Status: DC | PRN
  Administered 2019-05-31: 100 mg via INTRAVENOUS

## 2019-05-31 MED ORDER — MIDAZOLAM HCL 2 MG/2ML IJ SOLN
INTRAMUSCULAR | Status: AC
  Filled 2019-05-31: qty 2

## 2019-05-31 MED ORDER — OXYCODONE HCL 5 MG/5ML PO SOLN: 5.0000 mg | Freq: Once | ORAL | Status: DC | PRN

## 2019-05-31 MED ORDER — MIDAZOLAM HCL 2 MG/2ML IJ SOLN
INTRAMUSCULAR | Status: DC | PRN
  Administered 2019-05-31: 1 mg via INTRAVENOUS

## 2019-05-31 SURGICAL SUPPLY — 61 items
BLADE CLIPPER SURG (BLADE) ×3 IMPLANT
BLADE SURG 11 STRL SS (BLADE) ×3 IMPLANT
BOOT SUTURE AID YELLOW STND (SUTURE) IMPLANT
BUR ACORN 9.0 PRECISION (BURR) ×2 IMPLANT
BUR ACORN 9.0MM PRECISION (BURR) ×1
CANISTER SUCT 3000ML PPV (MISCELLANEOUS) ×3 IMPLANT
CLIP RANEY DISP (INSTRUMENTS) IMPLANT
CLOSURE WOUND 1/2 X4 (GAUZE/BANDAGES/DRESSINGS)
COVER WAND RF STERILE (DRAPES) IMPLANT
DECANTER SPIKE VIAL GLASS SM (MISCELLANEOUS) IMPLANT
DERMABOND ADVANCED (GAUZE/BANDAGES/DRESSINGS) ×2
DERMABOND ADVANCED .7 DNX12 (GAUZE/BANDAGES/DRESSINGS) ×1 IMPLANT
DRAPE HALF SHEET 40X57 (DRAPES) ×3 IMPLANT
DRAPE INCISE IOBAN 66X45 STRL (DRAPES) ×3 IMPLANT
DRAPE ORTHO SPLIT 77X108 STRL (DRAPES) ×4
DRAPE POUCH INSTRU U-SHP 10X18 (DRAPES) ×3 IMPLANT
DRAPE SURG ORHT 6 SPLT 77X108 (DRAPES) ×2 IMPLANT
DURAPREP 26ML APPLICATOR (WOUND CARE) ×6 IMPLANT
ELECT REM PT RETURN 9FT ADLT (ELECTROSURGICAL) ×3
ELECTRODE REM PT RTRN 9FT ADLT (ELECTROSURGICAL) ×1 IMPLANT
GAUZE 4X4 16PLY RFD (DISPOSABLE) IMPLANT
GLOVE BIO SURGEON STRL SZ7.5 (GLOVE) ×6 IMPLANT
GLOVE EXAM NITRILE XL STR (GLOVE) IMPLANT
GLOVE INDICATOR 7.5 STRL GRN (GLOVE) ×3 IMPLANT
GOWN STRL REUS W/ TWL LRG LVL3 (GOWN DISPOSABLE) ×2 IMPLANT
GOWN STRL REUS W/ TWL XL LVL3 (GOWN DISPOSABLE) IMPLANT
GOWN STRL REUS W/TWL 2XL LVL3 (GOWN DISPOSABLE) IMPLANT
GOWN STRL REUS W/TWL LRG LVL3 (GOWN DISPOSABLE) ×4
GOWN STRL REUS W/TWL XL LVL3 (GOWN DISPOSABLE)
HEMOSTAT POWDER KIT SURGIFOAM (HEMOSTASIS) ×3 IMPLANT
HEMOSTAT SURGICEL 2X14 (HEMOSTASIS) IMPLANT
KIT BASIN OR (CUSTOM PROCEDURE TRAY) ×3 IMPLANT
KIT TURNOVER KIT B (KITS) ×3 IMPLANT
MARKER SKIN DUAL TIP RULER LAB (MISCELLANEOUS) IMPLANT
NEEDLE HYPO 22GX1.5 SAFETY (NEEDLE) ×3 IMPLANT
NS IRRIG 1000ML POUR BTL (IV SOLUTION) ×3 IMPLANT
PACK LAMINECTOMY NEURO (CUSTOM PROCEDURE TRAY) ×3 IMPLANT
PAD ARMBOARD 7.5X6 YLW CONV (MISCELLANEOUS) ×9 IMPLANT
PASSER CATH 65CM DISP (NEUROSURGERY SUPPLIES) IMPLANT
SHEATH PERITONEAL INTRO 61 (MISCELLANEOUS) ×3 IMPLANT
SPONGE INTESTINAL PEANUT (DISPOSABLE) ×3 IMPLANT
SPONGE LAP 4X18 RFD (DISPOSABLE) IMPLANT
STAPLER SKIN PROX WIDE 3.9 (STAPLE) IMPLANT
STAPLER VISISTAT 35W (STAPLE) ×3 IMPLANT
STRIP CLOSURE SKIN 1/2X4 (GAUZE/BANDAGES/DRESSINGS) IMPLANT
SUT ETHILON 3 0 FSL (SUTURE) IMPLANT
SUT MON AB 3-0 SH 27 (SUTURE) ×4
SUT MON AB 3-0 SH27 (SUTURE) ×2 IMPLANT
SUT NURALON 4 0 TR CR/8 (SUTURE) ×3 IMPLANT
SUT SILK 0 TIES 10X30 (SUTURE) ×3 IMPLANT
SUT SILK 3 0 SH 30 (SUTURE) IMPLANT
SUT VIC AB 2-0 CP2 18 (SUTURE) ×9 IMPLANT
SUT VIC AB 3-0 SH 8-18 (SUTURE) IMPLANT
SYR CONTROL 10ML LL (SYRINGE) ×3 IMPLANT
TOWEL GREEN STERILE (TOWEL DISPOSABLE) ×6 IMPLANT
TOWEL GREEN STERILE FF (TOWEL DISPOSABLE) ×3 IMPLANT
TUBE CONNECTING 12'X1/4 (SUCTIONS)
TUBE CONNECTING 12X1/4 (SUCTIONS) IMPLANT
UNDERPAD 30X30 (UNDERPADS AND DIAPERS) ×3 IMPLANT
VALVE PROGRAM CERTAS SM ANTI (Valve) ×3 IMPLANT
WATER STERILE IRR 1000ML POUR (IV SOLUTION) ×3 IMPLANT

## 2019-05-31 NOTE — Progress Notes (Signed)
Nutrition Follow-up  DOCUMENTATION CODES:   Obesity unspecified  INTERVENTION:   If PO intake does not improve recommend considering short term feeding tube for support.   -Continue  Ensure Enlive increase to TID, each supplement provides 350 kcal and 20 grams of protein  -MVI with minerals daily  -Magic cup TID with meals, each supplement provides 290 kcal and 9 grams of protein - change to orange creme flavor   Add Boost Breeze po TID, each supplement provides 250 kcal and 9 grams of protein   NUTRITION DIAGNOSIS:   Inadequate oral intake related to decreased appetite as evidenced by meal completion < 50%.  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  Pt with CSF leak s/p multi-layered repair but developed post-op CSF rhinorrhea.  Pt in OR for shunt today. Pt discussed during ICU rounds and with RN.  Per RN pt continues to state she does not have much of an appetite. Wants to drink ginger ale and juice but not eat much. Concerned intake has not improved despite nutrition interventions.   Meal Completion: 0-50%  8/29 - lumbar drain removed 9/7 - lumbar drain placement   Medications reviewed and include: gabapentin, colace,senokot, SSI novolog  Diet Order:   Diet Order            Diet NPO time specified  Diet effective midnight              EDUCATION NEEDS:   Education needs have been addressed  Skin:  Skin Assessment: Reviewed RN Assessment Skin Integrity Issues:: Incisions, Other (Comment) Incisions: closed back, abdomen, and nose Other: non pressure wounds to rt and lt labia  Last BM:  9/14 large  Height:   Ht Readings  from Last 1 Encounters:  05/16/19 5\' 6"  (1.676 m)    Weight:   Wt Readings from Last 1 Encounters:  05/16/19 107.5 kg    Ideal Body Weight:  59 kg  BMI:  Body mass index is 38.25 kg/m.  Estimated Nutritional Needs:   Kcal:  2000-2200  Protein:  100-120 grams  Fluid:  > 2 L/day  Maylon Peppers RD, LDN, CNSC [phone] Pager [phone] After Hours Pager

## 2019-05-31 NOTE — Anesthesia Procedure Notes (Signed)
Procedure Name: Intubation Date/Time: 05/31/2019 1:46 PM Performed by: Clearnce Sorrel, CRNA Pre-anesthesia Checklist: Patient identified, Emergency Drugs available, Suction available, Patient being monitored and Timeout performed Patient Re-evaluated:Patient Re-evaluated prior to induction Oxygen Delivery Method: Circle system utilized Preoxygenation: Pre-oxygenation with 100% oxygen Induction Type: IV induction Ventilation: Mask ventilation without difficulty and Oral airway inserted - appropriate to patient size Laryngoscope Size: Mac and 3 Grade View: Grade I Tube type: Oral Tube size: 7.0 mm Number of attempts: 1 Airway Equipment and Method: Stylet Placement Confirmation: ETT inserted through vocal cords under direct vision,  positive ETCO2 and breath sounds checked- equal and bilateral Secured at: 23 cm Tube secured with: Tape Dental Injury: Teeth and Oropharynx as per pre-operative assessment

## 2019-05-31 NOTE — Op Note (Signed)
PATIENT: [name]  DAY OF SURGERY: 05/31/19   PRE-OPERATIVE DIAGNOSIS:  Communicating hydrocephalus   POST-OPERATIVE DIAGNOSIS:  Communicating hydrocephalus   PROCEDURE:  Right ventriculoperitoneal shunt placement   SURGEON:  Surgeon(s) and Role:    Judith Part, MD - Primary   ANESTHESIA: ETGA   BRIEF HISTORY: This is a 62 year old woman who had meningitis in the setting of prior pituitary tumor resection, likely due to a CSF leak. She was taken to the OR for repeat resection. Post-operatively, she had a complicated course with continued CSF leak that required return to the OR and continued CSF diversion. She developed fevers and was presumed to have ventriculitis, which was treated and she is now afebrile off antibiotics for the past 5 days. She was unable to be weaned from her lumbar drain and developed symptoms of hydrocephalus whenever she didn't have CSF diversion, so I recommended placement of a right VP shunt. This was discussed with the patient and her sister as well as risks, benefits, and alternatives and wished to proceed with shunt placement.   OPERATIVE DETAIL: The patient was taken to the operating room and placed on the OR table in the supine position. A formal time out was performed with two patient identifiers and confirmed the operative site. Anesthesia was induced by the anesthesia team. The head was turned contralaterally and a bump was placed under the ipsilateral shoulder to assist with tunneling. The operative sites were marked, hair was clipped with surgical clippers, and the area was then prepped and draped in a sterile fashion along the entire length of the shunt system. Given her prior incisions for fat grafts, a subxiphoid incision was used superior to the umbilicus.  A curvilinear incision was placed on the right scalp over Kocher's point. A burr hole was placed, dura coagulated, and a ventricular catheter was inserted using standard landmarks with  good flow of CSF. Using a tunneler, the distal catheter was then passed subcutaneously from the scalp incision to the previously marked abdominal incision. The abdominal incision was opened and a small laparotomy was created. Intra-peritoneal location was confirmed by irrigation as well as passing a Penfield #4 across midline. The shunt system was flushed then connected to the proximal catheter. After spontaneous flow was seen through the distal catheter, it was then placed intraperitoneal without resistance. With the shunt system in place and functioning, all incisions were copiously irrigated and then closed in layers. All instrument and sponge counts were correct. The patient was then returned to anesthesia for emergence. No apparent complications at the completion of the procedure.  IMPLANTS: Certas shunt valve, set to performance level 5   EBL:  20mL   DRAINS: none   SPECIMENS: none   Judith Part, MD 05/31/19 1:35 PM

## 2019-05-31 NOTE — Transfer of Care (Signed)
Immediate Anesthesia Transfer of Care Note  Patient: [name]  Procedure(s) Performed: SHUNT INSERTION VENTRICULAR-PERITONEAL; REMOVAL OF LUMBAR DRAIN (Right )  Patient Location: PACU  Anesthesia Type:General  Level of Consciousness: awake, alert  and oriented  Airway & Oxygen Therapy: Patient Spontanous Breathing and Patient connected to nasal cannula oxygen  Post-op Assessment: Report given to RN and Post -op Vital signs reviewed and stable  Post vital signs: Reviewed and stable  Last Vitals:  Vitals Value Taken Time  BP    Temp    Pulse    Resp    SpO2      Last Pain:  Vitals:   05/31/19 1200  TempSrc:   PainSc: 0-No pain      Patients Stated Pain Goal: 0 ([id number])  Complications: No apparent anesthesia complications

## 2019-05-31 NOTE — Brief Op Note (Signed)
05/31/2019  3:59 PM  PATIENT:  [name]  63 y.o. female  PRE-OPERATIVE DIAGNOSIS:  Hydrocephalus  POST-OPERATIVE DIAGNOSIS:  Hydrocephalus  PROCEDURE:  Procedure(s) with comments: SHUNT INSERTION VENTRICULAR-PERITONEAL; REMOVAL OF LUMBAR DRAIN (Right) - SHUNT INSERTION VENTRICULAR-PERITONEAL; REMOVAL OF LUMBAR DRAIN  SURGEON:  Surgeon(s) and Role:    * Nels Munn, Joyice Faster, MD - Primary  PHYSICIAN ASSISTANT:   ASSISTANTS: none   ANESTHESIA:   general  EBL:  25 mL   BLOOD ADMINISTERED:none  DRAINS: none   LOCAL MEDICATIONS USED:  LIDOCAINE   SPECIMEN:  No Specimen  DISPOSITION OF SPECIMEN:  N/A  COUNTS:  YES  TOURNIQUET:  * No tourniquets in log *  DICTATION: .Note written in EPIC  PLAN OF CARE: Admit to inpatient   PATIENT DISPOSITION:  PACU - hemodynamically stable.   Delay start of Pharmacological VTE agent (>24hrs) due to surgical blood loss or risk of bleeding: yes

## 2019-05-31 NOTE — Anesthesia Preprocedure Evaluation (Signed)
Anesthesia Evaluation  Patient identified by MRN, date of birth, ID band Patient awake    Reviewed: Allergy & Precautions, NPO status , Patient's Chart, lab work & pertinent test results  History of Anesthesia Complications Negative for: history of anesthetic complications  Airway Mallampati: II  TM Distance: >3 FB Neck ROM: Full    Dental  (+) Dental Advisory Given   Pulmonary neg shortness of breath, neg COPD, neg recent URI, former smoker,    breath sounds clear to auscultation       Cardiovascular hypertension, Pt. on medications (-) angina(-) Past MI and (-) CHF  Rhythm:Regular     Neuro/Psych  Headaches, Hydrocephalus  Neuromuscular disease negative psych ROS   GI/Hepatic negative GI ROS, Neg liver ROS,   Endo/Other  diabetes, Type 2, Oral Hypoglycemic Agents  Renal/GU Renal disease     Musculoskeletal   Abdominal   Peds  Hematology   Anesthesia Other Findings   Reproductive/Obstetrics                             Anesthesia Physical Anesthesia Plan  ASA: III  Anesthesia Plan: General   Post-op Pain Management:    Induction: Intravenous  PONV Risk Score and Plan: 3 and Ondansetron and Dexamethasone  Airway Management Planned: Oral ETT  Additional Equipment: None  Intra-op Plan:   Post-operative Plan: Extubation in OR  Informed Consent: I have reviewed the patients History and Physical, chart, labs and discussed the procedure including the risks, benefits and alternatives for the proposed anesthesia with the patient or authorized representative who has indicated his/her understanding and acceptance.     Dental advisory given  Plan Discussed with: CRNA and Surgeon  Anesthesia Plan Comments:         Anesthesia Quick Evaluation

## 2019-05-31 NOTE — Progress Notes (Signed)
Patient's CBG in 450s. CBG taken a second time and in 480s. STAT lab verification order placed. PA Costella paged and made aware. Verbal order for a dose of 10 units of insulin given. Will continue to monitor patient and blood sugar.

## 2019-05-31 NOTE — Progress Notes (Signed)
Neurosurgery Service Progress Note  Subjective: No acute events overnight, Tmax 37.2  Objective: Vitals:   05/31/19 0400 05/31/19 0500 05/31/19 0600 05/31/19 0700  BP: 124/75 120/71 129/73 125/72  Pulse: 89 89 92 88  Resp: 17 14 14 15   Temp: 98.8 F (37.1 C)     TempSrc: Oral     SpO2: 99% 99% 99% 98%  Weight:      Height:       Temp (24hrs), Avg:98.8 F (37.1 C), Min:98.7 F (37.1 C), Max:98.9 F (37.2 C)  CBC Latest Ref Rng & Units 05/25/2019 05/24/2019 05/23/2019  WBC 4.0 - 10.5 K/uL 5.9 6.4 6.2  Hemoglobin 12.0 - 15.0 g/dL 8.5(L) 7.6(L) 7.8(L)  Hematocrit 36.0 - 46.0 % 28.8(L) 24.5(L) 26.5(L)  Platelets 150 - 400 K/uL 273 252 264   BMP Latest Ref Rng & Units 05/30/2019 05/29/2019 05/28/2019  Glucose 70 - 99 mg/dL 176(H) 140(H) 129(H)  BUN 8 - 23 mg/dL 7(L) 9 8  Creatinine 0.44 - 1.00 mg/dL 1.26(H) 1.31(H) 1.40(H)  Sodium 135 - 145 mmol/L 139 137 136  Potassium 3.5 - 5.1 mmol/L 3.3(L) 3.4(L) 3.6  Chloride 98 - 111 mmol/L 112(H) 110 109  CO2 22 - 32 mmol/L 18(L) 17(L) 18(L)  Calcium 8.9 - 10.3 mg/dL 9.3 9.3 9.5    Intake/Output Summary (Last 24 hours) at 05/31/2019 0716 Last data filed at 05/31/2019 0700 Gross per 24 hour  Intake 2008.28 ml  Output 3563 ml  Net -1554.72 ml    Current Facility-Administered Medications:  .  0.9 %  sodium chloride infusion, , Intravenous, PRN, Newman Pies, MD, Stopped at 05/25/19 1657 .  0.9 %  sodium chloride infusion, , Intravenous, Continuous, Newman Pies, MD, Last Rate: 75 mL/hr at 05/31/19 0700 .  acetaminophen (TYLENOL) tablet 650 mg, 650 mg, Oral, Q4H PRN, 650 mg at 05/24/19 2205 **OR** acetaminophen (TYLENOL) suppository 650 mg, 650 mg, Rectal, Q4H PRN, Newman Pies, MD, 650 mg at 05/16/19 0746 .  alum & mag hydroxide-simeth (MAALOX/MYLANTA) 200-200-20 MG/5ML suspension 30 mL, 30 mL, Oral, Q6H PRN, Newman Pies, MD, 30 mL at 05/05/19 1523 .  amLODipine (NORVASC) tablet 10 mg, 10 mg, Oral, Daily, Newman Pies,  MD, 10 mg at 05/30/19 1014 .  atorvastatin (LIPITOR) tablet 40 mg, 40 mg, Oral, Daily, Newman Pies, MD, 40 mg at 05/30/19 1743 .  Chlorhexidine Gluconate Cloth 2 % PADS 6 each, 6 each, Topical, Daily, Newman Pies, MD, 6 each at 05/30/19 2100 .  docusate sodium (COLACE) capsule 100 mg, 100 mg, Oral, BID, Newman Pies, MD, 100 mg at 05/30/19 1013 .  feeding supplement (ENSURE ENLIVE) (ENSURE ENLIVE) liquid 237 mL, 237 mL, Oral, TID BM, Jazline Cumbee A, MD, 237 mL at 05/30/19 1013 .  Fluorescein Sodium 10 % injection 500 mg, 500 mg, Intravenous, Once, Newman Pies, MD .  gabapentin (NEURONTIN) capsule 300 mg, 300 mg, Oral, BID, Newman Pies, MD, 300 mg at 05/30/19 2114 .  Gerhardt's butt cream, , Topical, BID, Newman Pies, MD .  HYDROcodone-acetaminophen (NORCO/VICODIN) 5-325 MG per tablet 1 tablet, 1 tablet, Oral, Q4H PRN, Newman Pies, MD, 1 tablet at 05/19/19 0236 .  HYDROmorphone (DILAUDID) injection 0.5 mg, 0.5 mg, Intravenous, Q3H PRN, Newman Pies, MD, 0.5 mg at 04/26/19 0939 .  insulin aspart (novoLOG) injection 0-20 Units, 0-20 Units, Subcutaneous, TID WC, Newman Pies, MD, 4 Units at 05/30/19 1244 .  insulin aspart (novoLOG) injection 0-5 Units, 0-5 Units, Subcutaneous, QHS, Newman Pies, MD, 2 Units at 05/16/19 2126 .  iron  polysaccharides (NIFEREX) capsule 150 mg, 150 mg, Oral, BID, Newman Pies, MD, 150 mg at 05/30/19 2114 .  labetalol (NORMODYNE) injection 10-40 mg, 10-40 mg, Intravenous, Q10 min PRN, Newman Pies, MD, 20 mg at 05/20/19 0310 .  lidocaine (PF) (XYLOCAINE) 1 % injection, , , PRN, Jacqulynn Cadet, MD, 10 mL at 04/19/19 1436 .  lip balm (CARMEX) ointment, , Topical, PRN, Judith Part, MD .  ondansetron (ZOFRAN) tablet 4 mg, 4 mg, Oral, Q4H PRN, 4 mg at 04/30/19 1043 **OR** ondansetron (ZOFRAN) injection 4 mg, 4 mg, Intravenous, Q4H PRN, Newman Pies, MD, 4 mg at 05/16/19 0910 .  ondansetron (ZOFRAN) tablet 4  mg, 4 mg, Oral, Q6H PRN **OR** ondansetron (ZOFRAN) injection 4 mg, 4 mg, Intravenous, Q6H PRN, Newman Pies, MD, 4 mg at 05/05/19 0941 .  pantoprazole (PROTONIX) EC tablet 40 mg, 40 mg, Oral, Daily, Newman Pies, MD, 40 mg at 05/30/19 1013 .  polyethylene glycol (MIRALAX / GLYCOLAX) packet 17 g, 17 g, Oral, Daily PRN, Newman Pies, MD .  promethazine [facility]) injection 12.5-25 mg, 12.5-25 mg, Intravenous, Q4H PRN, Newman Pies, MD, 25 mg at 05/15/19 2239 .  promethazine (PHENERGAN) tablet 12.5-25 mg, 12.5-25 mg, Oral, Q4H PRN, Newman Pies, MD .  senna [facility]) tablet 8.6 mg, 1 tablet, Oral, BID, Newman Pies, MD, 8.6 mg at 05/30/19 1014 .  sodium chloride (OCEAN) 0.65 % nasal spray 4 spray, 4 spray, Each Nare, PRN, Newman Pies, MD .  tiZANidine (ZANAFLEX) tablet 4 mg, 4 mg, Oral, Q8H PRN, Newman Pies, MD, 4 mg at 04/20/19 1554   Physical Exam: Awake, Ox3, PERRL, EOMI, FS, Strength 5/5 x4, SILTx4, VFF with mild bitemporal hemianopsia No rhinorrhea on exam this morning Abdominal incision packed on R, healing well on L  Assessment & Plan: 63 y.o. 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 Failed bedside lumbar drain placement. 8/11 LD placed under fluoro, minimal output, 8/14 LD removed; 8/16 rhinorrhea, LD replaced, 8/19 somnolence, CTH w/ large volume pneumocephalus, LD clamped, 8/21 s/p repeat endonasal skull base reconstruction, 8/25 rpt CTH with resolving pneumocephalus, febrile started on vanc/cefepime, 9/19 finished course of ABx  -OR today for VP shunt placement -clamp LD when on call for OR -SCDs/TEDs/([facility])  Judith Part  05/31/19 7:16 AM

## 2019-06-01 ENCOUNTER — Encounter (HOSPITAL_COMMUNITY): Payer: Self-pay | Admitting: Neurological Surgery

## 2019-06-01 ENCOUNTER — Inpatient Hospital Stay (HOSPITAL_COMMUNITY): Payer: BC Managed Care – PPO

## 2019-06-01 LAB — RENAL FUNCTION PANEL
Albumin: 3.2 g/dL — ABNORMAL LOW (ref 3.5–5.0)
Anion gap: 9 (ref 5–15)
BUN: 9 mg/dL (ref 8–23)
CO2: 18 mmol/L — ABNORMAL LOW (ref 22–32)
Calcium: 10 mg/dL (ref 8.9–10.3)
Chloride: 114 mmol/L — ABNORMAL HIGH (ref 98–111)
Creatinine, Ser: 1.38 mg/dL — ABNORMAL HIGH (ref 0.44–1.00)
GFR calc Af Amer: 48 mL/min — ABNORMAL LOW (ref >60)
GFR calc non Af Amer: 41 mL/min — ABNORMAL LOW (ref >60)
Glucose, Bld: 234 mg/dL — ABNORMAL HIGH (ref 70–99)
Phosphorus: 2.3 mg/dL — ABNORMAL LOW (ref 2.5–4.6)
Potassium: 4.7 mmol/L (ref 3.5–5.1)
Sodium: 141 mmol/L (ref 135–145)

## 2019-06-01 LAB — GLUCOSE, CAPILLARY
Glucose-Capillary: 176 mg/dL — ABNORMAL HIGH (ref 70–99)
Glucose-Capillary: 180 mg/dL — ABNORMAL HIGH (ref 70–99)
Glucose-Capillary: 223 mg/dL — ABNORMAL HIGH (ref 70–99)
Glucose-Capillary: 228 mg/dL — ABNORMAL HIGH (ref 70–99)
Glucose-Capillary: 302 mg/dL — ABNORMAL HIGH (ref 70–99)
Glucose-Capillary: 383 mg/dL — ABNORMAL HIGH (ref 70–99)

## 2019-06-01 MED ORDER — INSULIN ASPART 100 UNIT/ML ~~LOC~~ SOLN
0.0000 [IU] | SUBCUTANEOUS | Status: DC
  Administered 2019-06-01: 15 [IU] via SUBCUTANEOUS
  Administered 2019-06-01: 11 [IU] via SUBCUTANEOUS

## 2019-06-01 NOTE — Anesthesia Postprocedure Evaluation (Signed)
Anesthesia Post Note  Patient: [name]  Procedure(s) Performed: SHUNT INSERTION VENTRICULAR-PERITONEAL; REMOVAL OF LUMBAR DRAIN (Right )     Patient location during evaluation: PACU Anesthesia Type: General Level of consciousness: awake and alert Pain management: pain level controlled Vital Signs Assessment: post-procedure vital signs reviewed and stable Respiratory status: spontaneous breathing, nonlabored ventilation, respiratory function stable and patient connected to nasal cannula oxygen Cardiovascular status: blood pressure returned to baseline and stable Postop Assessment: no apparent nausea or vomiting Anesthetic complications: no    Last Vitals:  Vitals:   06/01/19 1700 06/01/19 1800  BP: 116/72 113/77  Pulse: 81 79  Resp: 11 13  Temp: 37.6 C   SpO2: 100% 100%    Last Pain:  Vitals:   06/01/19 1700  TempSrc: Axillary  PainSc:                  Marie Chow

## 2019-06-01 NOTE — Progress Notes (Addendum)
Patient was unable to void after several attempts. 639 mL found upon bladder scanning patient. Straight cath performed, yielding 700 mL of clear yellow urine.

## 2019-06-01 NOTE — Progress Notes (Signed)
Spoke to neurosurgery PA Costella. Patient's CBG is 383. New orders received for sub q insulin and CBG checks every 4 hours. Will continue to monitor.

## 2019-06-01 NOTE — Progress Notes (Signed)
Neurosurgery Service Progress Note  Subjective: No acute events overnight, no new complaints  Objective: Vitals:   06/01/19 0400 06/01/19 0500 06/01/19 0600 06/01/19 0700  BP: 131/84 118/75 106/73 101/68  Pulse: 80 81 81 76  Resp: (!) 5 12 14 12   Temp: (!) 97.5 F (36.4 C)     TempSrc: Axillary     SpO2: 99% 99% 98% 99%  Weight:      Height:       Temp (24hrs), Avg:97.7 F (36.5 C), Min:97 F (36.1 C), Max:98.7 F (37.1 C)  CBC Latest Ref Rng & Units 05/25/2019 05/24/2019 05/23/2019  WBC 4.0 - 10.5 K/uL 5.9 6.4 6.2  Hemoglobin 12.0 - 15.0 g/dL 8.5(L) 7.6(L) 7.8(L)  Hematocrit 36.0 - 46.0 % 28.8(L) 24.5(L) 26.5(L)  Platelets 150 - 400 K/uL 273 252 264   BMP Latest Ref Rng & Units 05/31/2019 05/30/2019 05/29/2019  Glucose 70 - 99 mg/dL 473(H) 176(H) 140(H)  BUN 8 - 23 mg/dL - 7(L) 9  Creatinine 0.44 - 1.00 mg/dL - 1.26(H) 1.31(H)  Sodium 135 - 145 mmol/L - 139 137  Potassium 3.5 - 5.1 mmol/L - 3.3(L) 3.4(L)  Chloride 98 - 111 mmol/L - 112(H) 110  CO2 22 - 32 mmol/L - 18(L) 17(L)  Calcium 8.9 - 10.3 mg/dL - 9.3 9.3    Intake/Output Summary (Last 24 hours) at 06/01/2019 0847 Last data filed at 06/01/2019 0700 Gross per 24 hour  Intake 2229.76 ml  Output 2548 ml  Net -318.24 ml    Current Facility-Administered Medications:  .  0.9 %  sodium chloride infusion, , Intravenous, PRN, Judith Part, MD, Stopped at 05/25/19 1657 .  0.9 %  sodium chloride infusion, , Intravenous, Continuous, Qasim Diveley, Joyice Faster, MD, Last Rate: 75 mL/hr at 06/01/19 0756 .  acetaminophen (TYLENOL) tablet 650 mg, 650 mg, Oral, Q4H PRN, 650 mg at 05/24/19 2205 **OR** acetaminophen (TYLENOL) suppository 650 mg, 650 mg, Rectal, Q4H PRN, Judith Part, MD, 650 mg at 05/16/19 0746 .  alum & mag hydroxide-simeth (MAALOX/MYLANTA) 200-200-20 MG/5ML suspension 30 mL, 30 mL, Oral, Q6H PRN, Judith Part, MD, 30 mL at 05/05/19 1523 .  amLODipine (NORVASC) tablet 10 mg, 10 mg, Oral, Daily,  Judith Part, MD, Stopped at 05/31/19 1050 .  atorvastatin (LIPITOR) tablet 40 mg, 40 mg, Oral, Daily, Judith Part, MD, 40 mg at 05/31/19 1748 .  Chlorhexidine Gluconate Cloth 2 % PADS 6 each, 6 each, Topical, Daily, Judith Part, MD, 6 each at 05/31/19 1050 .  docusate sodium (COLACE) capsule 100 mg, 100 mg, Oral, BID, Breeonna Mone, Joyice Faster, MD, Stopped at 05/31/19 1050 .  feeding supplement (BOOST / RESOURCE BREEZE) liquid 1 Container, 1 Container, Oral, TID BM, Judith Part, MD, 1 Container at 05/31/19 2038 .  feeding supplement (ENSURE ENLIVE) (ENSURE ENLIVE) liquid 237 mL, 237 mL, Oral, TID BM, Judith Part, MD, 237 mL at 05/31/19 2039 .  gabapentin (NEURONTIN) capsule 300 mg, 300 mg, Oral, BID, Judith Part, MD, 300 mg at 05/31/19 2221 .  Gerhardt's butt cream, , Topical, BID, Judith Part, MD, 1 application at [id number] .  HYDROcodone-acetaminophen (NORCO/VICODIN) 5-325 MG per tablet 1 tablet, 1 tablet, Oral, Q4H PRN, Judith Part, MD, 1 tablet at 05/31/19 1754 .  HYDROmorphone (DILAUDID) injection 0.5 mg, 0.5 mg, Intravenous, Q3H PRN, Judith Part, MD, 0.5 mg at 04/26/19 0939 .  insulin aspart (novoLOG) injection 0-15 Units, 0-15 Units, Subcutaneous, Q4H, Costella, [employer], PA-C, 11  Units at 06/01/19 0407 .  insulin aspart (novoLOG) injection 0-20 Units, 0-20 Units, Subcutaneous, TID WC, Judith Part, MD, 3 Units at 05/31/19 1748 .  insulin aspart (novoLOG) injection 0-5 Units, 0-5 Units, Subcutaneous, QHS, Judith Part, MD, 2 Units at 05/16/19 2126 .  iron polysaccharides (NIFEREX) capsule 150 mg, 150 mg, Oral, BID, Judith Part, MD, 150 mg at 05/31/19 2221 .  labetalol (NORMODYNE) injection 10-40 mg, 10-40 mg, Intravenous, Q10 min PRN, Judith Part, MD, 20 mg at 05/20/19 0310 .  lidocaine (PF) (XYLOCAINE) 1 % injection, , , PRN, Jacqulynn Cadet, MD, 10 mL at 04/19/19 1436 .  lip balm (CARMEX)  ointment, , Topical, PRN, Judith Part, MD .  ondansetron (ZOFRAN) tablet 4 mg, 4 mg, Oral, Q4H PRN, 4 mg at 04/30/19 1043 **OR** ondansetron (ZOFRAN) injection 4 mg, 4 mg, Intravenous, Q4H PRN, Judith Part, MD, 4 mg at 05/16/19 0910 .  pantoprazole (PROTONIX) EC tablet 40 mg, 40 mg, Oral, Daily, Judith Part, MD, Stopped at 05/31/19 1052 .  polyethylene glycol (MIRALAX / GLYCOLAX) packet 17 g, 17 g, Oral, Daily PRN, Judith Part, MD .  promethazine (PHENERGAN) injection 12.5-25 mg, 12.5-25 mg, Intravenous, Q4H PRN, Judith Part, MD, 25 mg at 05/15/19 2239 .  promethazine (PHENERGAN) tablet 12.5-25 mg, 12.5-25 mg, Oral, Q4H PRN, Judith Part, MD .  senna (SENOKOT) tablet 8.6 mg, 1 tablet, Oral, BID, Payslie Mccaig, Joyice Faster, MD, Stopped at 05/31/19 1052 .  sodium chloride (OCEAN) 0.65 % nasal spray 4 spray, 4 spray, Each Nare, PRN, Judith Part, MD .  tiZANidine (ZANAFLEX) tablet 4 mg, 4 mg, Oral, Q8H PRN, Judith Part, MD, 4 mg at 04/20/19 1554   Physical Exam: Awake, Ox3, PERRL, EOMI, FS, Strength 5/5 x4, SILTx4, VFF with mild bitemporal hemianopsia Abdominal incisions packed on R, healing well on L and subxiphoid   Assessment & Plan: 63 y.o. 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 Failed bedside lumbar drain placement. 8/11 LD placed under fluoro, minimal output, 8/14 LD removed; 8/16 rhinorrhea, LD replaced, 8/19 somnolence, CTH w/ large volume pneumocephalus, LD clamped, 8/21 s/p repeat endonasal skull base reconstruction, 8/25 rpt CTH with resolving pneumocephalus, febrile started on vanc/cefepime, 9/19 finished course of ABx, 9/22 s/p R VPS placement & LD removal  -CTH shows catheter in good position, KUB shows catheter extending into the pelvis without kink or  obstruction -transfer to stepdown, d/c foley -continue CBG checks q4h, RISS -SCDs/TEDs, restart SQH POD2  Judith Part  06/01/19 8:47 AM

## 2019-06-01 NOTE — Progress Notes (Signed)
Inpatient Diabetes Program Recommendations  AACE/ADA: New Consensus Statement on Inpatient Glycemic Control (2015)  Target Ranges:  Prepandial:   less than 140 mg/dL      Peak postprandial:   less than 180 mg/dL (1-2 hours)      Critically ill patients:  140 - 180 mg/dL   Lab Results  Component Value Date   GLUCAP 223 (H) 06/01/2019    Review of Glycemic Control  Blood sugars today - 383, 302, 223. May benefit from addition of basal insulin with high FBS.  Inpatient Diabetes Program Recommendations:     Add Lantus 10 units Q24H.  Continue to follow.  Thank you. Lorenda Peck, RD, LDN, CDE Inpatient Diabetes Coordinator [phone]

## 2019-06-02 LAB — GLUCOSE, CAPILLARY
Glucose-Capillary: 173 mg/dL — ABNORMAL HIGH (ref 70–99)
Glucose-Capillary: 180 mg/dL — ABNORMAL HIGH (ref 70–99)
Glucose-Capillary: 194 mg/dL — ABNORMAL HIGH (ref 70–99)
Glucose-Capillary: 226 mg/dL — ABNORMAL HIGH (ref 70–99)
Glucose-Capillary: 255 mg/dL — ABNORMAL HIGH (ref 70–99)
Glucose-Capillary: 292 mg/dL — ABNORMAL HIGH (ref 70–99)

## 2019-06-02 MED ORDER — HEPARIN SODIUM (PORCINE) 5000 UNIT/ML IJ SOLN
5000.0000 [IU] | Freq: Three times a day (TID) | INTRAMUSCULAR | Status: DC
  Administered 2019-06-02 – 2019-06-11 (×27): 5000 [IU] via SUBCUTANEOUS
  Filled 2019-06-02 (×27): qty 1

## 2019-06-02 MED ORDER — TAMSULOSIN HCL 0.4 MG PO CAPS
0.4000 mg | ORAL_CAPSULE | Freq: Every day | ORAL | Status: DC
  Administered 2019-06-02 – 2019-06-08 (×7): 0.4 mg via ORAL
  Filled 2019-06-02 (×7): qty 1

## 2019-06-02 NOTE — Progress Notes (Signed)
Rehab Admissions Coordinator Note:  Patient was screened by Michel Santee for appropriateness for an Inpatient Acute Rehab Consult.  At this time, we are recommending Inpatient Rehab consult.  Please place Rehab Consult order if pt would like to be considered.   Michel Santee 06/02/2019, 11:17 AM  I can be reached at [phone].

## 2019-06-02 NOTE — Progress Notes (Signed)
Pt not voided in 6hrs. Bladder scan showed 396. In and out cath pt for the 2nd time since foley discontinued.

## 2019-06-02 NOTE — Evaluation (Signed)
Physical Therapy Evaluation Patient Details Name: [name] MRN: [id number] DOB: [birth year] Today's Date: 06/02/2019   History of Present Illness  Pt is a 63 y.o. F with significant PMH of DM Type 2, L eye cataract, glaucoma, breast cancer who presented for endoscopic endonasasl redo transphenoidal resection of a recurrent pituitary adenoma with intraoperative CSF leak operatively.  Pt is now s/p VP shunt placement and removal of lumbar drain.    Clinical Impression  Pt admitted with above diagnosis. On eval, pt required mod assist bed mobility and mod assist transfers with RW. She presents with deficits in strength, balance, cognition and activity tolerance following extended hospitalization.  Pt currently with functional limitations due to the deficits listed below (see PT Problem List). Pt will benefit from skilled PT to increase their independence and safety with mobility to allow discharge to the venue listed below.       Follow Up Recommendations CIR;Supervision/Assistance - 24 hour    Equipment Recommendations  Rolling walker with 5" wheels    Recommendations for Other Services       Precautions / Restrictions Precautions Precautions: Fall Restrictions Weight Bearing Restrictions: No      Mobility  Bed Mobility Overal bed mobility: Needs Assistance Bed Mobility: Supine to Sit;Sit to Supine     Supine to sit: Mod assist Sit to supine: Mod assist   General bed mobility comments: +rail, increased time and effort  Transfers Overall transfer level: Needs assistance Equipment used: Rolling walker (2 wheeled) Transfers: Sit to/from Stand Sit to Stand: Mod assist Stand pivot transfers: Mod assist       General transfer comment: assist to power up and stabilize balance  Ambulation/Gait             General Gait Details: uanble due to weakness  Stairs            Wheelchair Mobility    Modified Rankin (Stroke Patients Only)       Balance  Overall balance assessment: Needs assistance Sitting-balance support: Bilateral upper extremity supported;Feet supported Sitting balance-Leahy Scale: Fair     Standing balance support: Bilateral upper extremity supported;During functional activity Standing balance-Leahy Scale: Poor Standing balance comment: reliant on external support                             Pertinent Vitals/Pain Pain Assessment: Faces Faces Pain Scale: No hurt Pain Location: R side of face Pain Descriptors / Indicators: Sore Pain Intervention(s): Limited activity within patient's tolerance;Monitored during session;Repositioned    Home Living Family/patient expects to be discharged to:: Private residence Living Arrangements: Alone Available Help at Discharge: Family;Available PRN/intermittently Type of Home: House Home Access: Level entry     Home Layout: One level Home Equipment: None Additional Comments: 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  Totally independent.      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     Hand Dominance   Dominant Hand: Right    Extremity/Trunk Assessment   Upper Extremity Assessment Upper Extremity Assessment: Defer to OT evaluation    Lower Extremity Assessment Lower Extremity Assessment: Generalized weakness    Cervical / Trunk Assessment Cervical / Trunk Assessment: Normal  Communication   Communication: No difficulties  Cognition Arousal/Alertness: Lethargic Behavior During Therapy: Flat affect;WFL for tasks assessed/performed Overall Cognitive Status: Impaired/Different from baseline Area of Impairment: Orientation;Memory;Attention;Following commands;Safety/judgement;Awareness;Problem solving  Orientation Level: Disoriented to;Place;Time;Situation Current Attention Level: Focused Memory: Decreased short-term memory Following Commands: Follows one step commands  with increased time;Follows multi-step commands inconsistently Safety/Judgement: Decreased awareness of safety;Decreased awareness of deficits Awareness: Intellectual Problem Solving: Slow processing;Difficulty sequencing;Requires verbal cues;Requires tactile cues General Comments: continuous cues to stay on task. Easily distracted.      General Comments General comments (skin integrity, edema, etc.): Cognition significantly affecting independence with adls. Pt has been in bed for quite some time and all things considered, mobilzed well.    Exercises     Assessment/Plan    PT Assessment Patient needs continued PT services  PT Problem List Decreased strength;Decreased activity tolerance;Decreased balance;Decreased mobility;Decreased cognition;Decreased safety awareness       PT Treatment Interventions Gait training;DME instruction;Stair training;Functional mobility training;Therapeutic activities;Therapeutic exercise;Balance training;Patient/family education    PT Goals (Current goals can be found in the Care Plan section)  Acute Rehab PT Goals Patient Stated Goal: not stated PT Goal Formulation: With patient Time For Goal Achievement: 06/16/19 Potential to Achieve Goals: Good    Frequency Min 3X/week   Barriers to discharge        Co-evaluation               AM-PAC PT "6 Clicks" Mobility  Outcome Measure Help needed turning from your back to your side while in a flat bed without using bedrails?: A Little Help needed moving from lying on your back to sitting on the side of a flat bed without using bedrails?: A Lot Help needed moving to and from a bed to a chair (including a wheelchair)?: A Lot Help needed standing up from a chair using your arms (e.g., wheelchair or bedside chair)?: A Lot Help needed to walk in hospital room?: A Lot Help needed climbing 3-5 steps with a railing? : Total 6 Click Score: 12    End of Session Equipment Utilized During Treatment: Gait  belt Activity Tolerance: Patient limited by lethargy Patient left: in chair;with call bell/phone within reach;with chair alarm set Nurse Communication: Mobility status PT Visit Diagnosis: Muscle weakness (generalized) (M62.81);Difficulty in walking, not elsewhere classified (R26.2)    Time: [id number] PT Time Calculation (min) (ACUTE ONLY): 12 min   Charges:   PT Evaluation $PT Eval Moderate Complexity: 1 Mod          Lorrin Goodell, PT  Office # [phone] Pager [phone]   Lorriane Shire 06/02/2019, 12:22 PM

## 2019-06-02 NOTE — Evaluation (Signed)
Occupational Therapy Evaluation Patient Details Name: [name] MRN: [id number] DOB: 1956/03/09 Today's Date: 06/02/2019    History of Present Illness Pt is a 63 y.o. F with significant PMH of DM Type 2, L eye cataract, glaucoma, breast cancer who presents for endoscopic endonasasl redo transphenoidal resection of a recurrent pituitary adenoma with intraoperative CSF leak operatively.  Pt readmitted for VP shunt placement and removal of lumbar drain.     Clinical Impression   Pt admitted with the above diagnosis and has the deficits outlined below. Pt would benefit from cont OT to increase ability to complete basic adls and adls on her own as well as increase her safety with functional mobility.  Pt has significant cognitive deficits and physical weakness from her extended time in bed but overall mobilized well for the first time up in some time.  Feel, if she has someone that she can live with after hospitalization, inpatient rehab would be beneficial.  Will continue to follow to achieve goals stated below.     Follow Up Recommendations  CIR;Supervision/Assistance - 24 hour    Equipment Recommendations  Other (comment)(tbd)    Recommendations for Other Services Rehab consult     Precautions / Restrictions Precautions Precautions: Fall Restrictions Weight Bearing Restrictions: No      Mobility Bed Mobility Overal bed mobility: Needs Assistance Bed Mobility: Supine to Sit     Supine to sit: Mod assist     General bed mobility comments: Increased time to achieve upright, difficulty sequencing. Min assist for facilitation  Transfers Overall transfer level: Needs assistance Equipment used: Rolling walker (2 wheeled);1 person hand held assist Transfers: Sit to/from [facility] Sit to Stand: Mod assist Stand pivot transfers: Mod assist       General transfer comment: Difficulty moving R LE only during transfers    Balance Overall balance  assessment: Needs assistance Sitting-balance support: Bilateral upper extremity supported;Feet supported Sitting balance-Leahy Scale: Fair     Standing balance support: Bilateral upper extremity supported;During functional activity Standing balance-Leahy Scale: Poor Standing balance comment: must have outside support                           ADL either performed or assessed with clinical judgement   ADL Overall ADL's : Needs assistance/impaired Eating/Feeding: Minimal assistance;Cueing for sequencing;Sitting Eating/Feeding Details (indicate cue type and reason): Pt required cues to cut food before eating and to open ice cream before attempting to eat it.   Grooming: Minimal assistance;Sitting;Cueing for sequencing Grooming Details (indicate cue type and reason): cues required to put toothpaste on brush before brushing. Upper Body Bathing: Moderate assistance;Sitting;Cueing for sequencing   Lower Body Bathing: Maximal assistance;Sit to/from stand;Cueing for compensatory techniques;Cueing for sequencing Lower Body Bathing Details (indicate cue type and reason): Pt fatigues quickly. Requires cues to continue the task and stay focused.   Upper Body Dressing : Moderate assistance;Sitting   Lower Body Dressing: Maximal assistance;Sit to/from stand;Cueing for compensatory techniques;Cueing for sequencing   Toilet Transfer: Moderate assistance;Stand-pivot;BSC;RW   Toileting- Clothing Manipulation and Hygiene: Total assistance;Sit to/from stand Toileting - Clothing Manipulation Details (indicate cue type and reason): Therapist assists pt with standing while second person manages clothing.     Functional mobility during ADLs: Moderate assistance;Rolling walker General ADL Comments: Overall min to mod assist due to general weakness from being bed bound so long and from decreased cognition.     Vision Baseline Vision/History: Wears glasses Wears Glasses: Reading only  Patient  Visual Report: No change from baseline;Other (comment)(may require further testing.  Difficult b/c of poor cognitio) Vision Assessment?: No apparent visual deficits     Perception Perception Perception Tested?: No   Praxis Praxis Praxis tested?: Not tested    Pertinent Vitals/Pain Pain Assessment: Faces Faces Pain Scale: Hurts a little bit Pain Location: R side of face Pain Descriptors / Indicators: Sore Pain Intervention(s): Limited activity within patient's tolerance;Monitored during session;Repositioned     Hand Dominance Right   Extremity/Trunk Assessment Upper Extremity Assessment Upper Extremity Assessment: Generalized weakness   Lower Extremity Assessment Lower Extremity Assessment: Defer to PT evaluation   Cervical / Trunk Assessment Cervical / Trunk Assessment: Normal   Communication Communication Communication: No difficulties   Cognition Arousal/Alertness: Lethargic Behavior During Therapy: WFL for tasks assessed/performed Overall Cognitive Status: Impaired/Different from baseline Area of Impairment: Orientation;Memory;Attention;Following commands;Safety/judgement;Awareness;Problem solving                 Orientation Level: Disoriented to;Place;Time;Situation Current Attention Level: Focused Memory: Decreased short-term memory Following Commands: Follows one step commands consistently;Follows multi-step commands inconsistently Safety/Judgement: Decreased awareness of safety;Decreased awareness of deficits Awareness: Intellectual Problem Solving: Slow processing;Difficulty sequencing;Requires verbal cues;Requires tactile cues General Comments: Pt with poor cognition.  Does recall some long term memory items but could not recall that breakfast was delivered two minutes ago.  Has safety issues.   General Comments  Cognition significantly affecting independence with adls. Pt has been in bed for quite some time and all things considered, mobilzed well.     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  Totally independent.        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        OT Problem List: Decreased strength;Impaired balance (sitting and/or standing);Decreased cognition;Decreased safety awareness;Decreased knowledge of use of DME or AE;Decreased knowledge of precautions;Pain      OT Treatment/Interventions: Self-care/ADL training;DME and/or AE instruction;Therapeutic activities    OT Goals(Current goals can be found in the care plan section) Acute Rehab OT Goals Patient Stated Goal: "do better." OT Goal Formulation: With patient Time For Goal Achievement: 06/16/19 Potential to Achieve Goals: Good ADL Goals Pt Will Perform Eating: with set-up;sitting Pt Will Perform Grooming: with min guard assist;standing Pt Will Perform Upper Body Bathing: with set-up;sitting Pt Will Perform Upper Body Dressing: with set-up;sitting Pt Will Perform Lower Body Dressing: with min assist;sit to/from stand Pt Will Transfer to Toilet: with min guard assist;ambulating;grab bars Pt Will Perform Toileting - Clothing Manipulation and hygiene: with min assist;sit to/from stand  OT Frequency: Min 2X/week   Barriers to D/C: Decreased caregiver support  lives alone.  If had outside support, would rec CIR       Co-evaluation              AM-PAC OT "6 Clicks" Daily Activity     Outcome Measure Help from another person eating meals?: A Little Help from another person taking  care of personal grooming?: A Little Help from another person  toileting, which includes using toliet, bedpan, or urinal?: A Lot Help from another person bathing (including washing, rinsing, drying)?: A Lot Help from another person to put on and taking off regular upper body clothing?: A Little Help from another person to put on and taking off regular lower body clothing?: A Lot 6 Click Score: 15   End of Session Equipment Utilized During Treatment: Rolling walker;Gait belt Nurse Communication: Mobility status  Activity Tolerance: Patient tolerated treatment well Patient left: in chair;with call bell/phone within reach;with chair alarm set  OT Visit Diagnosis: Unsteadiness on feet (R26.81);Other abnormalities of gait and mobility (R26.89);Other symptoms and signs involving the nervous system (R29.898);Other symptoms and signs involving cognitive function                Time: [id number] OT Time Calculation (min): 32 min Charges:  OT General Charges $OT Visit: 1 Visit OT Evaluation $OT Eval Moderate Complexity: 1 Mod OT Treatments $Self Care/Home Management : 8-22 mins   Glenford Peers 06/02/2019, 9:53 AM

## 2019-06-02 NOTE — Progress Notes (Signed)
Neurosurgery Service Progress Note  Subjective: No acute events overnight, no new complaints  Objective: Vitals:   06/01/19 2357 06/02/19 0000 06/02/19 0334 06/02/19 0335  BP: 93/79 111/65  115/72  Pulse: 82 84  80  Resp:  12  13  Temp: 98.5 F (36.9 C)  97.8 F (36.6 C)   TempSrc: Oral  Oral   SpO2: 100% 100%  99%  Weight:      Height:       Temp (24hrs), Avg:98.3 F (36.8 C), Min:97.6 F (36.4 C), Max:99.6 F (37.6 C)  CBC Latest Ref Rng & Units 05/25/2019 05/24/2019 05/23/2019  WBC 4.0 - 10.5 K/uL 5.9 6.4 6.2  Hemoglobin 12.0 - 15.0 g/dL 8.5(L) 7.6(L) 7.8(L)  Hematocrit 36.0 - 46.0 % 28.8(L) 24.5(L) 26.5(L)  Platelets 150 - 400 K/uL 273 252 264   BMP Latest Ref Rng & Units 06/01/2019 05/31/2019 05/30/2019  Glucose 70 - 99 mg/dL 234(H) 473(H) 176(H)  BUN 8 - 23 mg/dL 9 - 7(L)  Creatinine 0.44 - 1.00 mg/dL 1.38(H) - 1.26(H)  Sodium 135 - 145 mmol/L 141 - 139  Potassium 3.5 - 5.1 mmol/L 4.7 - 3.3(L)  Chloride 98 - 111 mmol/L 114(H) - 112(H)  CO2 22 - 32 mmol/L 18(L) - 18(L)  Calcium 8.9 - 10.3 mg/dL 10.0 - 9.3    Intake/Output Summary (Last 24 hours) at 06/02/2019 0810 Last data filed at 06/02/2019 0509 Gross per 24 hour  Intake 73.75 ml  Output 1100 ml  Net -1026.25 ml    Current Facility-Administered Medications:  .  0.9 %  sodium chloride infusion, , Intravenous, PRN, Judith Part, MD, Stopped at 05/25/19 1657 .  0.9 %  sodium chloride infusion, , Intravenous, Continuous, , Joyice Faster, MD, Stopped at 06/01/19 [phone] .  acetaminophen (TYLENOL) tablet 650 mg, 650 mg, Oral, Q4H PRN, 650 mg at 05/24/19 2205 **OR** acetaminophen (TYLENOL) suppository 650 mg, 650 mg, Rectal, Q4H PRN, Judith Part, MD, 650 mg at 05/16/19 0746 .  alum & mag hydroxide-simeth (MAALOX/MYLANTA) 200-200-20 MG/5ML suspension 30 mL, 30 mL, Oral, Q6H PRN, Judith Part, MD, 30 mL at 05/05/19 1523 .  amLODipine (NORVASC) tablet 10 mg, 10 mg, Oral, Daily, ,  A,  MD, 10 mg at 06/01/19 1027 .  atorvastatin (LIPITOR) tablet 40 mg, 40 mg, Oral, Daily, Judith Part, MD, 40 mg at 06/01/19 1816 .  Chlorhexidine Gluconate Cloth 2 % PADS 6 each, 6 each, Topical, Daily, Judith Part, MD, 6 each at 05/31/19 1050 .  docusate sodium (COLACE) capsule 100 mg, 100 mg, Oral, BID, Judith Part, MD, 100 mg at 06/01/19 2132 .  feeding supplement (BOOST / RESOURCE BREEZE) liquid 1 Container, 1 Container, Oral, TID BM, Judith Part, MD, Stopped at 06/01/19 1302 .  feeding supplement (ENSURE ENLIVE) (ENSURE ENLIVE) liquid 237 mL, 237 mL, Oral, TID BM, Judith Part, MD, Stopped at 06/01/19 1302 .  gabapentin (NEURONTIN) capsule 300 mg, 300 mg, Oral, BID, Judith Part, MD, 300 mg at 06/01/19 2133 .  Gerhardt's butt cream, , Topical, BID, Judith Part, MD, 1 application at [id number] .  HYDROcodone-acetaminophen (NORCO/VICODIN) 5-325 MG per tablet 1 tablet, 1 tablet, Oral, Q4H PRN, Judith Part, MD, 1 tablet at 06/01/19 2041 .  HYDROmorphone (DILAUDID) injection 0.5 mg, 0.5 mg, Intravenous, Q3H PRN, Judith Part, MD, 0.5 mg at 04/26/19 0939 .  insulin aspart (novoLOG) injection 0-20 Units, 0-20 Units, Subcutaneous, TID WC, , Joyice Faster, MD, 4 Units  at 06/01/19 1815 .  insulin aspart (novoLOG) injection 0-5 Units, 0-5 Units, Subcutaneous, QHS, Judith Part, MD, 2 Units at 05/16/19 2126 .  iron polysaccharides (NIFEREX) capsule 150 mg, 150 mg, Oral, BID, Judith Part, MD, 150 mg at 06/01/19 2325 .  labetalol (NORMODYNE) injection 10-40 mg, 10-40 mg, Intravenous, Q10 min PRN, Judith Part, MD, 20 mg at 05/20/19 0310 .  lidocaine (PF) (XYLOCAINE) 1 % injection, , , PRN, Jacqulynn Cadet, MD, 10 mL at 04/19/19 1436 .  lip balm (CARMEX) ointment, , Topical, PRN, Judith Part, MD .  ondansetron (ZOFRAN) tablet 4 mg, 4 mg, Oral, Q4H PRN, 4 mg at 04/30/19 1043 **OR** ondansetron (ZOFRAN)  injection 4 mg, 4 mg, Intravenous, Q4H PRN, Judith Part, MD, 4 mg at 05/16/19 0910 .  pantoprazole (PROTONIX) EC tablet 40 mg, 40 mg, Oral, Daily, , Joyice Faster, MD, 40 mg at 06/01/19 1027 .  polyethylene glycol (MIRALAX / GLYCOLAX) packet 17 g, 17 g, Oral, Daily PRN, Judith Part, MD .  promethazine (PHENERGAN) injection 12.5-25 mg, 12.5-25 mg, Intravenous, Q4H PRN, Judith Part, MD, 25 mg at 05/15/19 2239 .  promethazine (PHENERGAN) tablet 12.5-25 mg, 12.5-25 mg, Oral, Q4H PRN, Judith Part, MD .  senna (SENOKOT) tablet 8.6 mg, 1 tablet, Oral, BID, Judith Part, MD, 8.6 mg at 06/01/19 2132 .  sodium chloride (OCEAN) 0.65 % nasal spray 4 spray, 4 spray, Each Nare, PRN, Judith Part, MD .  tiZANidine (ZANAFLEX) tablet 4 mg, 4 mg, Oral, Q8H PRN, Judith Part, MD, 4 mg at 04/20/19 1554   Physical Exam: Awake, Ox2, PERRL, EOMI, FS, Strength 5/5 x4, SILTx4, VFF with mild bitemporal hemianopsia Scalp/relay site incisions healing well, abdominal incisions packed on R, healing well on L and subxiphoid   Assessment & Plan: 63 y.o. 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 Failed bedside lumbar drain placement. 8/11 LD placed under fluoro, minimal output, 8/14 LD removed; 8/16 rhinorrhea, LD replaced, 8/19 somnolence, CTH w/ large volume pneumocephalus, LD clamped, 8/21 s/p repeat endonasal skull base reconstruction, 8/25 rpt CTH with resolving pneumocephalus, febrile started on vanc/cefepime, 9/19 finished course of ABx, 9/22 s/p R VPS placement & LD removal  -foley d/c'd, required I&O x2, will start tamsulosin -continue CBG checks q4h, RISS, BG better controlled today -SCDs/TEDs, restart SQH  Judith Part  06/02/19 8:10 AM

## 2019-06-03 ENCOUNTER — Inpatient Hospital Stay (HOSPITAL_COMMUNITY): Payer: BC Managed Care – PPO

## 2019-06-03 DIAGNOSIS — D62 Acute posthemorrhagic anemia: Secondary | ICD-10-CM

## 2019-06-03 DIAGNOSIS — N183 Chronic kidney disease, stage 3 (moderate): Secondary | ICD-10-CM

## 2019-06-03 DIAGNOSIS — E1169 Type 2 diabetes mellitus with other specified complication: Secondary | ICD-10-CM

## 2019-06-03 DIAGNOSIS — E669 Obesity, unspecified: Secondary | ICD-10-CM

## 2019-06-03 DIAGNOSIS — Z982 Presence of cerebrospinal fluid drainage device: Secondary | ICD-10-CM

## 2019-06-03 DIAGNOSIS — E119 Type 2 diabetes mellitus without complications: Secondary | ICD-10-CM

## 2019-06-03 DIAGNOSIS — G910 Communicating hydrocephalus: Secondary | ICD-10-CM

## 2019-06-03 LAB — RENAL FUNCTION PANEL
Albumin: 3.3 g/dL — ABNORMAL LOW (ref 3.5–5.0)
Anion gap: 11 (ref 5–15)
BUN: 11 mg/dL (ref 8–23)
CO2: 21 mmol/L — ABNORMAL LOW (ref 22–32)
Calcium: 9.5 mg/dL (ref 8.9–10.3)
Chloride: 111 mmol/L (ref 98–111)
Creatinine, Ser: 1.17 mg/dL — ABNORMAL HIGH (ref 0.44–1.00)
GFR calc Af Amer: 58 mL/min — ABNORMAL LOW (ref >60)
GFR calc non Af Amer: 50 mL/min — ABNORMAL LOW (ref >60)
Glucose, Bld: 270 mg/dL — ABNORMAL HIGH (ref 70–99)
Phosphorus: 3.5 mg/dL (ref 2.5–4.6)
Potassium: 4.1 mmol/L (ref 3.5–5.1)
Sodium: 143 mmol/L (ref 135–145)

## 2019-06-03 LAB — GLUCOSE, CAPILLARY
Glucose-Capillary: 247 mg/dL — ABNORMAL HIGH (ref 70–99)
Glucose-Capillary: 274 mg/dL — ABNORMAL HIGH (ref 70–99)
Glucose-Capillary: 267 mg/dL — ABNORMAL HIGH (ref 70–99)
Glucose-Capillary: 236 mg/dL — ABNORMAL HIGH (ref 70–99)

## 2019-06-03 NOTE — Progress Notes (Signed)
Neurosurgery Service Progress Note  Subjective: No acute events overnight, no new complaints  Objective: Vitals:   06/03/19 0018 06/03/19 0518 06/03/19 0523 06/03/19 0752  BP: 134/80 (!) 143/101 (!) 150/88 128/77  Pulse: 94 (!) 105  94  Resp:  13  16  Temp: 97.8 F (36.6 C) 97.7 F (36.5 C)  98 F (36.7 C)  TempSrc: Axillary Oral  Oral  SpO2: 99%   100%  Weight:      Height:       Temp (24hrs), Avg:98.2 F (36.8 C), Min:97.7 F (36.5 C), Max:98.8 F (37.1 C)  CBC Latest Ref Rng & Units 05/25/2019 05/24/2019 05/23/2019  WBC 4.0 - 10.5 K/uL 5.9 6.4 6.2  Hemoglobin 12.0 - 15.0 g/dL 8.5(L) 7.6(L) 7.8(L)  Hematocrit 36.0 - 46.0 % 28.8(L) 24.5(L) 26.5(L)  Platelets 150 - 400 K/uL 273 252 264   BMP Latest Ref Rng & Units 06/01/2019 05/31/2019 05/30/2019  Glucose 70 - 99 mg/dL 234(H) 473(H) 176(H)  BUN 8 - 23 mg/dL 9 - 7(L)  Creatinine 0.44 - 1.00 mg/dL 1.38(H) - 1.26(H)  Sodium 135 - 145 mmol/L 141 - 139  Potassium 3.5 - 5.1 mmol/L 4.7 - 3.3(L)  Chloride 98 - 111 mmol/L 114(H) - 112(H)  CO2 22 - 32 mmol/L 18(L) - 18(L)  Calcium 8.9 - 10.3 mg/dL 10.0 - 9.3    Intake/Output Summary (Last 24 hours) at 06/03/2019 0825 Last data filed at 06/03/2019 0757 Gross per 24 hour  Intake -  Output 550 ml  Net -550 ml    Current Facility-Administered Medications:  .  0.9 %  sodium chloride infusion, , Intravenous, PRN, Judith Part, MD, Stopped at 05/25/19 1657 .  0.9 %  sodium chloride infusion, , Intravenous, Continuous, Sunjai Levandoski, Joyice Faster, MD, Stopped at 06/01/19 [phone]  acetaminophen (TYLENOL) tablet 650 mg, 650 mg, Oral, Q4H PRN, 650 mg at 05/24/19 2205 **OR** acetaminophen (TYLENOL) suppository 650 mg, 650 mg, Rectal, Q4H PRN, Judith Part, MD, 650 mg at 05/16/19 0746 .  alum & mag hydroxide-simeth (MAALOX/MYLANTA) 200-200-20 MG/5ML suspension 30 mL, 30 mL, Oral, Q6H PRN, Judith Part, MD, 30 mL at 05/05/19 1523 .  amLODipine (NORVASC) tablet 10 mg, 10 mg, Oral,  Daily, Judith Part, MD, 10 mg at 06/02/19 0953 .  atorvastatin (LIPITOR) tablet 40 mg, 40 mg, Oral, Daily, Judith Part, MD, 40 mg at 06/02/19 1721 .  Chlorhexidine Gluconate Cloth 2 % PADS 6 each, 6 each, Topical, Daily, Judith Part, MD, 6 each at 05/31/19 1050 .  docusate sodium (COLACE) capsule 100 mg, 100 mg, Oral, BID, Judith Part, MD, 100 mg at 06/02/19 2140 .  feeding supplement (BOOST / RESOURCE BREEZE) liquid 1 Container, 1 Container, Oral, TID BM, Judith Part, MD, 1 Container at 06/02/19 1333 .  feeding supplement (ENSURE ENLIVE) (ENSURE ENLIVE) liquid 237 mL, 237 mL, Oral, TID BM, [name] Pallone A, MD, 237 mL at 06/02/19 1332 .  gabapentin (NEURONTIN) capsule 300 mg, 300 mg, Oral, BID, Judith Part, MD, 300 mg at 06/02/19 2141 .  Gerhardt's butt cream, , Topical, BID, Navjot Loera A, MD .  heparin injection 5,000 Units, 5,000 Units, Subcutaneous, Q8H, Judith Part, MD, 5,000 Units at 06/03/19 0521 .  HYDROcodone-acetaminophen (NORCO/VICODIN) 5-325 MG per tablet 1 tablet, 1 tablet, Oral, Q4H PRN, Judith Part, MD, 1 tablet at 06/01/19 2041 .  HYDROmorphone (DILAUDID) injection 0.5 mg, 0.5 mg, Intravenous, Q3H PRN, Judith Part, MD, 0.5 mg at  04/26/19 0939 .  insulin aspart (novoLOG) injection 0-20 Units, 0-20 Units, Subcutaneous, TID WC, Malina Geers, Joyice Faster, MD, 7 Units at 06/02/19 1720 .  insulin aspart (novoLOG) injection 0-5 Units, 0-5 Units, Subcutaneous, QHS, Judith Part, MD, 3 Units at 06/02/19 2217 .  iron polysaccharides (NIFEREX) capsule 150 mg, 150 mg, Oral, BID, Judith Part, MD, 150 mg at 06/02/19 2140 .  labetalol (NORMODYNE) injection 10-40 mg, 10-40 mg, Intravenous, Q10 min PRN, Judith Part, MD, 20 mg at 05/20/19 0310 .  lidocaine (PF) (XYLOCAINE) 1 % injection, , , PRN, Jacqulynn Cadet, MD, 10 mL at 04/19/19 1436 .  lip balm (CARMEX) ointment, , Topical, PRN, Judith Part, MD .  ondansetron (ZOFRAN) tablet 4 mg, 4 mg, Oral, Q4H PRN, 4 mg at 04/30/19 1043 **OR** ondansetron (ZOFRAN) injection 4 mg, 4 mg, Intravenous, Q4H PRN, Judith Part, MD, 4 mg at 05/16/19 0910 .  pantoprazole (PROTONIX) EC tablet 40 mg, 40 mg, Oral, Daily, Judith Part, MD, 40 mg at 06/02/19 0953 .  polyethylene glycol (MIRALAX / GLYCOLAX) packet 17 g, 17 g, Oral, Daily PRN, Judith Part, MD .  promethazine (PHENERGAN) injection 12.5-25 mg, 12.5-25 mg, Intravenous, Q4H PRN, Judith Part, MD, 25 mg at 05/15/19 2239 .  promethazine (PHENERGAN) tablet 12.5-25 mg, 12.5-25 mg, Oral, Q4H PRN, Judith Part, MD .  senna (SENOKOT) tablet 8.6 mg, 1 tablet, Oral, BID, Judith Part, MD, 8.6 mg at 06/02/19 2140 .  sodium chloride (OCEAN) 0.65 % nasal spray 4 spray, 4 spray, Each Nare, PRN, Judith Part, MD .  tamsulosin (FLOMAX) capsule 0.4 mg, 0.4 mg, Oral, Daily, Justun Anaya, Joyice Faster, MD, 0.4 mg at 06/02/19 0953 .  tiZANidine (ZANAFLEX) tablet 4 mg, 4 mg, Oral, Q8H PRN, Judith Part, MD, 4 mg at 04/20/19 1554   Physical Exam: Awake, Ox2, PERRL, EOMI, FS, Strength 5/5 x4, SILTx4, VFF with mild bitemporal hemianopsia Scalp/relay site incisions healing well, abdominal incisions packed on R, healing well on L and subxiphoid   Assessment & Plan: 63 y.o. 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 Failed bedside lumbar drain placement. 8/11 LD placed under fluoro, minimal output, 8/14 LD removed; 8/16 rhinorrhea, LD replaced, 8/19 somnolence, CTH w/ large volume pneumocephalus, LD clamped, 8/21 s/p repeat endonasal skull base reconstruction, 8/25 rpt CTH with resolving pneumocephalus, febrile started on vanc/cefepime, 9/19 finished course of ABx, 9/22 s/p R VPS placement & LD  removal  -foley d/c'd, on tamsulosin -continue CBG checks q4h, RISS, BG better controlled today -rpt CTH today to evaluate ventricular size to see if shunt needs adjustment prior to discharge -SCDs/TEDs, SQH -PM&R c/s for CIR transfer  Judith Part  06/03/19 8:25 AM

## 2019-06-03 NOTE — Consult Note (Signed)
Physical Medicine and Rehabilitation Consult   Reason for Consult: Meningitis  Referring Physician: Dr. Zada Finders   HPI: [name] is a 63 y.o. 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  History taken from chart review due to cognition.  She was admitted on 04/15/2019 for repeat endoscopic transphenoidal resection of tumor by Drs. Ostergard and [employer]. Post op course complicated by fevers, confusion, cognitive deficits, visual disturbance within the right temporal field, nasal drainage, mobility.  She was taken to OR for lumbar drain placement on 04/24/2019.  Follow-up head CT on 04/27/2019 showed large pneumocephalus with mass-effect on the right frontal lobe and diffuse cerebral sulcal effacement as well as posterior displacement of brainstem.  This was thought to be secondary to train and therefore drain was clamped. She was taken back to OR on 04/29/2019 for repair of CSF leak with abdominal wound revision. On 05/04/2019 she developed recurrent fever with tachycardia. Was monitored on empiric antibiotics, Lumbar drain clamped as drainage resolved but on 8/31, she had recurrent drainage therefore placed on bedrest. On 09/06, she developed hypoxia with fever and tachycardia with decline in mental status. CSF culture showed 830 WBC with CSF protein -488.  ID consulted for input and she was started on Vanc/Cefepime for bacterial meningitis.  She continued to have fevers and antibiotic coverage broadened. MRI brain revealing small volume infectious debris layering in lateral and 4th ventricle and moderate to severe paranasal sinus with superimposed moderate to severe paranasal sinus mucosal thickening. CSF cultures and blood cultures negative and ID question ENT evaluation of sinuses as well  as shunt revision. Dr. Zada Finders felt that changes due to ventriculitis. Patient developed lethargy with attempts at LD clamping on 05/25/2019 therefore drain unclamped. She continued to have decline in MS with CT head showing concerns of hydrocephalus. Antibiotics discontinued on 9/18 as she was afebrile X 72 hours.  She was taken to the OR again on 05/31/2023 removal of lumbar drain and placement of VPS.  She tolerated extubation without difficulty and therapy evaluations completed yesterday revealing significant cognitive deficits, apraxia with ADL tasks, difficulty initiating RLE with mobility. Follow up CT head today to see if shunt needs to be adjusted. CIR recommended due to functional decline.     Review of Systems  Unable to perform ROS: Mental acuity     Past Medical History:  Diagnosis Date  . Anemia   . Breast cancer ([place]) 12/29/13   right 6:00 o'clock, lower outer  . Cataract of left eye   . Diabetes mellitus    type 2  . Family history of malignant neoplasm of breast   . Glaucoma    MD just watching, no eye drops  . High cholesterol   . History of blood transfusion 2015   with chemo treatments  . History of radiation therapy 08/12/14- 10/11/14   right breast /50.4 Gy/28 fx, right breast boost/ 10 Gy/ 5 fx  . HTN (hypertension)   . Personal history of chemotherapy   . Personal history of radiation therapy   . Pituitary adenoma ([place]) 10/30/2018  . Wears dentures    full top-partial bottom  . Wears dentures    full  . Wears glasses   . Wears glasses     Past Surgical History:  Procedure Laterality Date  .  ABDOMINAL HYSTERECTOMY  1993   endometriosis  . BREAST BIOPSY Left 12/29/2013  . BREAST LUMPECTOMY Right 02/08/2014  . BREAST LUMPECTOMY WITH AXILLARY LYMPH NODE BIOPSY Right 02/08/14   invasive ductal  . CRANIOTOMY N/A 04/15/2019   Procedure: Transphenoid resection of pituitary tumor with fat graft;  Surgeon: Judith Part, MD;  Location: [facility];  Service:  Neurosurgery;  Laterality: N/A;  . IR IMAGE GUIDED FLUID DRAIN BY CATHETER  04/19/2019  . MULTIPLE TOOTH EXTRACTIONS    . PLACEMENT OF LUMBAR DRAIN N/A 04/24/2019   Procedure: PLACEMENT OF LUMBAR DRAIN;  Surgeon: Eustace Moore, MD;  Location: [facility];  Service: Neurosurgery;  Laterality: N/A;  . PLACEMENT OF LUMBAR DRAIN N/A 05/16/2019   Procedure: PLACEMENT OF LUMBAR DRAIN;  Surgeon: Newman Pies, MD;  Location: [facility];  Service: Neurosurgery;  Laterality: N/A;  PLACEMENT OF LUMBAR DRAIN  . PORT-A-CATH REMOVAL    . PORTACATH PLACEMENT Left 02/08/2014   Procedure: INSERTION PORT-A-CATH;  Surgeon: Rolm Bookbinder, MD;  Location: [facility];  Service: General;  Laterality: Left;  . REPAIR OF CEREBROSPINAL FLUID LEAK N/A 04/29/2019   Procedure: Endoscopic Endonasal Cerebrospinal Fluid Leak repair, Abdominal wound revision and fat graft harvest, Intrathecal Fluorocein Injection;  Surgeon: Judith Part, MD;  Location: [facility];  Service: Neurosurgery;  Laterality: N/A;  . TONSILLECTOMY    . TRANSNASAL APPROACH N/A 04/15/2019   Procedure: TRANSNASAL APPROACH;  Surgeon: Jerrell Belfast, MD;  Location: [facility];  Service: ENT;  Laterality: N/A;  . TRANSPHENOIDAL APPROACH EXPOSURE N/A 04/15/2019   Procedure: ENDOSCOPIC TRANS NASAL APPROACH WITH FUSIONN;  Surgeon: Jerrell Belfast, MD;  Location: [place] [facility];  Service: ENT;  Laterality: N/A;  . TRANSPHENOIDAL APPROACH EXPOSURE N/A 04/29/2019   Procedure: Endoscopic repair of spinal fluid leak ;  Surgeon: Jerrell Belfast, MD;  Location: [facility];  Service: ENT;  Laterality: N/A;  . TRANSPHENOIDAL PITUITARY RESECTION  2006  . VENTRICULOPERITONEAL SHUNT Right 05/31/2019   Procedure: SHUNT INSERTION VENTRICULAR-PERITONEAL; REMOVAL OF LUMBAR DRAIN;  Surgeon: Judith Part, MD;  Location: [facility];  Service: Neurosurgery;  Laterality: Right;  SHUNT INSERTION VENTRICULAR-PERITONEAL; REMOVAL OF LUMBAR DRAIN  . WISDOM TOOTH EXTRACTION      Family History   Problem Relation Age of Onset  . Hypertension Mother   . Diabetes type II Mother   . Breast cancer Mother        dx ~79; deceased 2  . Hypertension Father   . Breast cancer Maternal Aunt        deceased 55  . Cancer Maternal Uncle        unk. type; deceased 31s  . Cancer Maternal Uncle        unk. type; deceased late 24s  . Uterine cancer Cousin 49       daughter of an unaffected mat aunt who is 69    Social History:  Single. Worked as a Wellsite geologist. She reports that she quit smoking about 25 years ago. Her smoking use included cigarettes. She has never used smokeless tobacco. She reports 'a little bit' of alcohol use. She reports that she does not use drugs.    Allergies: No Known Allergies    Medications Prior to Admission  Medication Sig Dispense Refill  . amLODipine (NORVASC) 10 MG tablet Take 10 mg by mouth daily.     Marland Kitchen atorvastatin (LIPITOR) 40 MG tablet Take 40 mg by mouth daily.    . diclofenac (VOLTAREN) 75 MG EC tablet Take 75  mg by mouth 2 (two) times daily.    Marland Kitchen FERREX 150 150 MG capsule Take 150 mg by mouth 2 (two) times daily.    Marland Kitchen gabapentin (NEURONTIN) 300 MG capsule TAKE 1 CAPSULE BY MOUTH AT BEDTIME (Patient taking differently: Take 300 mg by mouth 3 (three) times daily. ) 30 capsule 4  . glucose monitoring kit (FREESTYLE) monitoring kit One Touch Ultra machine and strips, patient tests her blood sugars bid    . glyBURIDE (DIABETA) 5 MG tablet Take 5 mg by mouth 2 (two) times daily with a meal.     . lisinopril-hydrochlorothiazide (ZESTORETIC) 20-12.5 MG tablet Take 2 tablets by mouth daily.    . metFORMIN (GLUCOPHAGE-XR) 500 MG 24 hr tablet Take 1,000 mg by mouth daily with breakfast.     . ONE TOUCH ULTRA TEST test strip     . tiZANidine (ZANAFLEX) 4 MG tablet Take 4 mg by mouth every 8 (eight) hours as needed for muscle spasms.    . Vitamin D, Ergocalciferol, (DRISDOL) 1.25 MG (50000 UT) CAPS capsule Take 50,000 Units by mouth every Sunday.        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  Totally independent.    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 Functional Status:  Mobility: Bed Mobility Overal bed mobility: Needs Assistance Bed Mobility: Supine to Sit, Sit to Supine Supine to sit: Mod assist Sit to supine: Mod assist General bed mobility comments: +rail, increased time and effort Transfers Overall transfer level: Needs assistance Equipment used: Rolling walker (2 wheeled) Transfers: Sit to/from Stand Sit to Stand: Mod assist Stand pivot transfers: Mod assist General transfer comment: assist to power up and stabilize balance Ambulation/Gait General Gait Details: uanble due to weakness    ADL: ADL Overall ADL's : Needs assistance/impaired Eating/Feeding: Minimal assistance, Cueing for sequencing, Sitting Eating/Feeding Details (indicate cue type and reason): Pt required cues to cut food before eating and to open ice cream before attempting to eat it.   Grooming: Minimal assistance, Sitting, Cueing for sequencing Grooming Details (indicate cue type and reason): cues required to put toothpaste on brush before brushing. Upper Body Bathing: Moderate assistance, Sitting, Cueing for sequencing Lower Body Bathing: Maximal assistance, Sit to/from stand, Cueing for compensatory techniques, Cueing for sequencing Lower Body Bathing Details (indicate cue type and reason): Pt fatigues quickly. Requires cues to continue the task and stay focused.   Upper Body Dressing : Moderate assistance, Sitting Lower Body  Dressing: Maximal assistance, Sit to/from stand, Cueing for compensatory techniques, Cueing for sequencing Toilet Transfer: Moderate assistance, Stand-pivot, BSC, RW Toileting- Clothing Manipulation and Hygiene: Total assistance, Sit to/from stand Toileting - Clothing Manipulation Details (indicate cue type and reason): Therapist assists pt with standing while second person manages clothing. Functional mobility during ADLs: Moderate assistance, Rolling walker General ADL Comments: Overall min to mod assist due to general weakness from being bed bound so long and from decreased cognition.  Cognition: Cognition Overall Cognitive Status: Impaired/Different from baseline Orientation Level: Oriented to person, Oriented to situation, Disoriented to place, Disoriented to time Cognition Arousal/Alertness: Lethargic Behavior During Therapy: Flat affect, WFL for tasks assessed/performed Overall Cognitive Status: Impaired/Different from baseline Area of Impairment: Orientation,  Memory, Attention, Following commands, Safety/judgement, Awareness, Problem solving Orientation Level: Disoriented to, Place, Time, Situation Current Attention Level: Focused Memory: Decreased short-term memory Following Commands: Follows one step commands with increased time, Follows multi-step commands inconsistently Safety/Judgement: Decreased awareness of safety, Decreased awareness of deficits Awareness: Intellectual Problem Solving: Slow processing, Difficulty sequencing, Requires verbal cues, Requires tactile cues General Comments: continuous cues to stay on task. Easily distracted.   Blood pressure 128/77, pulse 94, temperature 98 F (36.7 C), temperature source Oral, resp. rate 16, height '5\' 6"'$  (1.676 m), weight 107.5 kg, SpO2 100 %. Physical Exam  Nursing note and vitals reviewed. Constitutional: She appears well-developed.  Morbidly obese  HENT:  Head: Normocephalic and atraumatic.  Eyes: Right eye exhibits  no discharge. Left eye exhibits no discharge.  EOM impaired?  Neck: No tracheal deviation present. No thyromegaly present.  Respiratory: Effort normal.  GI: Soft. She exhibits distension.  Musculoskeletal:     Comments: No edema or tenderness in extremities  Neurological:  Alert and oriented to name only  Motor: Grossly 4-/5 throughout  Skin: Skin is warm and dry.  Psychiatric:  Unable to assess due to cognition    Results for orders placed or performed during the hospital encounter of 04/15/19 (from the past 24 hour(s))  Glucose, capillary     Status: Abnormal   Collection Time: 06/02/19 12:48 PM  Result Value Ref Range   Glucose-Capillary 255 (H) 70 - 99 mg/dL  Glucose, capillary     Status: Abnormal   Collection Time: 06/02/19  4:47 PM  Result Value Ref Range   Glucose-Capillary 226 (H) 70 - 99 mg/dL  Glucose, capillary     Status: Abnormal   Collection Time: 06/02/19  9:34 PM  Result Value Ref Range   Glucose-Capillary 292 (H) 70 - 99 mg/dL   No results found.  Assessment/Plan: Diagnosis: Ventriculitis with hydrocephalus Labsindependently reviewed.  Records reviewed and summated above.  1. Does the need for close, 24 hr/day medical supervision in concert with the patient's rehab needs make it unreasonable for this patient to be served in a less intensive setting? Yes  2. Co-Morbidities requiring supervision/potential complications: T2 DM (Monitor in accordance with exercise and adjust meds as necessary), pituitary tumor resection 12/06, breast cancer s/p XRT/chemo 2015, bacterial meningitis, CKD (avoid nephrotoxic meds, follow-up labs), ABLA (repeat labs, consider transfusion if necessary to ensure appropriate perfusion for increased activity tolerance) 3. Due to bladder management, bowel management, safety, skin/wound care, disease management, medication administration and patient education, does the patient require 24 hr/day rehab nursing? Yes 4. Does the patient require  coordinated care of a physician, rehab nurse, PT (1-2 hrs/day, 5 days/week), OT (1-2 hrs/day, 5 days/week) and SLP (1-2 hrs/day, 5 days/week) to address physical and functional deficits in the context of the above medical diagnosis(es)? Yes Addressing deficits in the following areas: balance, endurance, locomotion, strength, transferring, bowel/bladder control, bathing, dressing, feeding, grooming, toileting, cognition, speech, language and psychosocial support 5. Can the patient actively participate in an intensive therapy program of at least 3 hrs of therapy per day at least 5 days per week? Potentially 6. The potential for patient to make measurable gains while on inpatient rehab is excellent 7. Anticipated functional outcomes upon discharge from inpatient rehab are supervision  with PT, supervision with OT, supervision with SLP. 8. Estimated rehab length of stay to reach the above functional goals is: 15-19 days. 9. Anticipated D/C setting: Home 10. Anticipated post D/C treatments: HH therapy and Home excercise program 11.  Overall Rehab/Functional Prognosis: good  RECOMMENDATIONS: This patient's condition is appropriate for continued rehabilitative care in the following setting: CIR when medically stable and able to tolerate 3 hours of therapy per day if caregiver support available upon discharge. Patient has agreed to participate in recommended program. Potentially Note that insurance prior authorization may be required for reimbursement for recommended care.  Comment: Rehab Admissions Coordinator to follow up.   I have personally performed a face to face diagnostic evaluation, including, but not limited to relevant history and physical exam findings, of this patient and developed relevant assessment and plan.  Additionally, I have reviewed and concur with the physician assistant's documentation above.   Delice Lesch, MD, ABPMR Bary Leriche, PA-C 06/03/2019

## 2019-06-03 NOTE — Progress Notes (Addendum)
CTH obtained, vents still enlarged, will dial down shunt to performance level 2.0. Catheter had some abdominal loops on post-op scan, will get a AP/cross-table abdominal film to re-evaluate. Will repeat the [place] in the AM to see if ventricular size has changed.

## 2019-06-03 NOTE — Progress Notes (Signed)
Physical Therapy Treatment Patient Details Name: [name] MRN: [id number] DOB: 09-01-1956 Today's Date: 06/03/2019    History of Present Illness Pt is a 63 y.o. F with significant PMH of DM Type 2, L eye cataract, glaucoma, breast cancer who presented for endoscopic endonasasl redo transphenoidal resection of a recurrent pituitary adenoma with intraoperative CSF leak operatively.  Pt is now s/p VP shunt placement and removal of lumbar drain.    PT Comments    Patient progressing to OOB to [facility] and to recliner this session.  Remains rather imbalanced without awareness of posterior lean.  Still rather significantly cognitively impaired.  Will benefit from CIR level rehab prior to d/c home.    Follow Up Recommendations  CIR;Supervision/Assistance - 24 hour     Equipment Recommendations  Rolling walker with 5" wheels    Recommendations for Other Services       Precautions / Restrictions Precautions Precautions: Fall    Mobility  Bed Mobility Overal bed mobility: Needs Assistance Bed Mobility: Supine to Sit     Supine to sit: Mod assist     General bed mobility comments: used rail with cues, lots of increased time even to initiate  Transfers Overall transfer level: Needs assistance Equipment used: Rolling walker (2 wheeled) Transfers: Sit to/from [facility]   Stand pivot transfers: Mod assist       General transfer comment: lifting assist and heavy posterior lean initially with assist to gain balance; assist to step around to [facility] and guidance to slowly sit on seat, then assist to step to recliner with mod support for balance with 1 bad LOB mod/max A to recover  Ambulation/Gait                 Stairs             Wheelchair Mobility    Modified Rankin (Stroke Patients Only)       Balance Overall balance assessment: Needs assistance Sitting-balance support: Feet supported Sitting balance-Leahy Scale: Fair Sitting balance -  Comments: able to sit EOB with S   Standing balance support: Bilateral upper extremity supported;During functional activity Standing balance-Leahy Scale: Poor Standing balance comment: has to hold walker and mod support for balance while RN assisting with hygiene after toileting on BSC                            Cognition Arousal/Alertness: Awake/alert Behavior During Therapy: Flat affect Overall Cognitive Status: Impaired/Different from baseline Area of Impairment: Orientation;Attention;Memory;Awareness;Following commands;Problem solving                 Orientation Level: Disoriented to;Place;Time;Situation Current Attention Level: Focused Memory: Decreased short-term memory Following Commands: Follows one step commands with increased time Safety/Judgement: Decreased awareness of safety;Decreased awareness of deficits Awareness: Intellectual Problem Solving: Slow processing;Difficulty sequencing;Requires verbal cues;Requires tactile cues        Exercises      General Comments General comments (skin integrity, edema, etc.): wearing brief initially soiled with urine, RN placed purewick up in chair      Pertinent Vitals/Pain Pain Assessment: No/denies pain    Home Living                      Prior Function            PT Goals (current goals can now be found in the care plan section) Progress towards PT goals: Progressing toward goals  Frequency    Min 3X/week      PT Plan Current plan remains appropriate    Co-evaluation              AM-PAC PT "6 Clicks" Mobility   Outcome Measure  Help needed turning from your back to your side while in a flat bed without using bedrails?: A Little Help needed moving from lying on your back to sitting on the side of a flat bed without using bedrails?: A Lot Help needed moving to and from a bed to a chair (including a wheelchair)?: A Lot Help needed standing up from a chair using your arms  (e.g., wheelchair or bedside chair)?: A Lot Help needed to walk in hospital room?: A Lot Help needed climbing 3-5 steps with a railing? : Total 6 Click Score: 12    End of Session Equipment Utilized During Treatment: Gait belt Activity Tolerance: Patient tolerated treatment well Patient left: in chair;with call bell/phone within reach;with chair alarm set   PT Visit Diagnosis: Muscle weakness (generalized) (M62.81);Difficulty in walking, not elsewhere classified (R26.2)     Time: [id number] PT Time Calculation (min) (ACUTE ONLY): 25 min  Charges:  $Therapeutic Activity: 23-37 mins                     Magda Kiel, [place] [facility] [phone] 06/03/2019    Reginia Naas 06/03/2019, 6:04 PM

## 2019-06-03 NOTE — Progress Notes (Signed)
Inpatient Rehabilitation-Admissions Coordinator    Met with patient at the bedside to discuss team's recommendation for inpatient rehabilitation. Shared booklets, expectations while in CIR, expected length of stay, and anticipated functional level at DC (please see formal consult note by PM&R MD, Dr. Delice Lesch, for details). Due to lethargy and confusion, anticipate pt will not recall information shared during brief visit and I will need to meet with her again once more oriented. With permission, I did call her sister Fonnie Jarvis to see if pt has caregiver support available at DC that would support an IP Rehab admission. Will follow up once caregiver returns my call.   Jhonnie Garner, OTR/L  Rehab Admissions Coordinator  [phone] 06/03/2019 12:26 PM

## 2019-06-04 ENCOUNTER — Inpatient Hospital Stay (HOSPITAL_COMMUNITY): Payer: BC Managed Care – PPO

## 2019-06-04 LAB — GLUCOSE, CAPILLARY
Glucose-Capillary: 216 mg/dL — ABNORMAL HIGH (ref 70–99)
Glucose-Capillary: 293 mg/dL — ABNORMAL HIGH (ref 70–99)
Glucose-Capillary: 294 mg/dL — ABNORMAL HIGH (ref 70–99)
Glucose-Capillary: 125 mg/dL — ABNORMAL HIGH (ref 70–99)

## 2019-06-04 NOTE — Progress Notes (Signed)
Subjective: Patient reports doing ok this morning. No complaints overnight   Objective: Vital signs in last 24 hours: Temp:  [97.6 F (36.4 C)-98.2 F (36.8 C)] 98.1 F (36.7 C) (09/26 0736) Pulse Rate:  [78-96] 94 (09/26 0736) Resp:  [11-16] 15 (09/26 0736) BP: (118-128)/(66-79) 118/79 (09/26 0736) SpO2:  [98 %-100 %] 100 % (09/26 0736)  Intake/Output from previous day: 09/25 0701 - 09/26 0700 In: 480 [P.O.:480] Out: 950 [Urine:950] Intake/Output this shift: No intake/output data recorded.  Neurologic: Grossly normal  Lab Results: Lab Results  Component Value Date   WBC 5.9 05/25/2019   HGB 8.5 (L) 05/25/2019   HCT 28.8 (L) 05/25/2019   MCV 87.8 05/25/2019   PLT 273 05/25/2019   Lab Results  Component Value Date   INR 1.09 10/29/2018   BMET Lab Results  Component Value Date   NA 143 06/03/2019   K 4.1 06/03/2019   CL 111 06/03/2019   CO2 21 (L) 06/03/2019   GLUCOSE 270 (H) 06/03/2019   BUN 11 06/03/2019   CREATININE 1.17 (H) 06/03/2019   CALCIUM 9.5 06/03/2019    Studies/Results: Ct Head Wo Contrast  Result Date: 06/04/2019 CLINICAL DATA:  Subacute neuro deficit.  VP shunt. EXAM: CT HEAD WITHOUT CONTRAST TECHNIQUE: Contiguous axial images were obtained from the base of the skull through the vertex without intravenous contrast. COMPARISON:  CT head 06/03/2019 FINDINGS: Brain: Right frontal ventricular catheter unchanged in position. Catheter extends through the right frontal horn into the right hypothalamus lateral to the third ventricle. Mild amount of extra-axial gas in the right frontal region unchanged. Hypodensity surrounding the catheter tract in the right frontal lobe unchanged. No acute hemorrhage or mass-effect. Mild ventricular enlargement appears unchanged from yesterday. Vascular: Negative for hyperdense vessel. Skull: No acute abnormality. Burr hole right frontal bone for catheter placement. Sinuses/Orbits: Mild mucosal edema paranasal sinuses. Recent  trans-sphenoidal CSF leak repair. There is a defect in the anterior wall of the sella due to prior surgery. The sella is enlarged. Other: None IMPRESSION: Right frontal ventricular catheter unchanged in position in the region of the right hypothalamus. Mild ventricular enlargement stable. No acute hemorrhage. Electronically Signed   By: Franchot Gallo M.D.   On: 06/04/2019 08:51   Ct Head Wo Contrast  Result Date: 06/03/2019 CLINICAL DATA:  Encephalitis.  Ventriculostomy shunt placement. EXAM: CT HEAD WITHOUT CONTRAST TECHNIQUE: Contiguous axial images were obtained from the base of the skull through the vertex without intravenous contrast. COMPARISON:  CT head without contrast 06/01/2019 and 05/26/2019. FINDINGS: Brain: Ventricles remain dilated without significant interval change. Ventriculostomy catheter is stable in position. There is slight increased hypoattenuation about the catheter tract. Fourth ventricle is mildly dilated as well. White matter changes are otherwise stable. No acute cortical infarct is present. There is no hemorrhage. No significant extra-axial fluid collection is present. Pneumocephalus is slightly decreased. Vascular: Atherosclerotic calcifications are present within the cavernous internal carotid arteries. There is no hyperdense vessel. Skull: Right frontal ventriculostomy burr hole. Calvarium is otherwise intact. Sinuses/Orbits: The paranasal sinuses and mastoid air cells are clear. Transsphenoidal resection pituitary tumor with reconstruction is again noted. No active sinus disease is present. IMPRESSION: 1. Stable appearance of ventricular enlargement. 2. Ventriculostomy catheter stable in position. 3. Increased hypoattenuation surrounding the ventriculostomy catheter suggesting mild edema. 4. No other significant interval change. Electronically Signed   By: San Morelle M.D.   On: 06/03/2019 14:05   Dg Abd 2 Views  Result Date: 06/03/2019 CLINICAL DATA:  Hydrocephalus,  VP shunt. EXAM: ABDOMEN - 2 VIEW COMPARISON:  05/31/2019 FINDINGS: Bowel gas pattern is unremarkable. Stool is seen throughout the colon mixed with gas to the level of the rectum. Intraperitoneal shunt courses to the left of the spine and shows gentle coiling in the right lower quadrant without displacement of adjacent bowel loops. Catheter position has changed since previous study 05/31/2019 but shows no overt complicating features and appears uninterrupted. Tip in the right lower quadrant projecting over the right iliac bone on AP projection. Four total images are submitted in AP and lateral projection. IMPRESSION: Interval migration of the ventriculoperitoneal shunt in the right lower quadrant. No signs of complication or discontinuity. Electronically Signed   By: Zetta Bills M.D.   On: 06/03/2019 14:17    Assessment/Plan: CT head stable with unchanged ventricular size after shunt being dialed down to 2.0.    LOS: 50 days    [name] 06/04/2019, 9:04 AM

## 2019-06-04 NOTE — Progress Notes (Signed)
Pt's sister, Margaretha Sheffield, called unit seeking update. She was informed that there was no change since previous update; pt is still disoriented and currently resting.

## 2019-06-04 NOTE — Progress Notes (Signed)
2132: Spoke w/ patient's daughter, [name], regarding care. Update provided.

## 2019-06-05 LAB — GLUCOSE, CAPILLARY
Glucose-Capillary: 162 mg/dL — ABNORMAL HIGH (ref 70–99)
Glucose-Capillary: 145 mg/dL — ABNORMAL HIGH (ref 70–99)
Glucose-Capillary: 225 mg/dL — ABNORMAL HIGH (ref 70–99)
Glucose-Capillary: 154 mg/dL — ABNORMAL HIGH (ref 70–99)

## 2019-06-05 NOTE — Progress Notes (Signed)
Patient ID: [name], female   DOB: 1956-07-21, 63 y.o.   MRN: [id number] Awake and alert and pleasant.  Incisions clean and dry.  Conversant.  No change.

## 2019-06-06 LAB — RENAL FUNCTION PANEL
Albumin: 3.2 g/dL — ABNORMAL LOW (ref 3.5–5.0)
Anion gap: 13 (ref 5–15)
BUN: 15 mg/dL (ref 8–23)
CO2: 22 mmol/L (ref 22–32)
Calcium: 9.4 mg/dL (ref 8.9–10.3)
Chloride: 99 mmol/L (ref 98–111)
Creatinine, Ser: 1.15 mg/dL — ABNORMAL HIGH (ref 0.44–1.00)
GFR calc Af Amer: 59 mL/min — ABNORMAL LOW (ref >60)
GFR calc non Af Amer: 51 mL/min — ABNORMAL LOW (ref >60)
Glucose, Bld: 182 mg/dL — ABNORMAL HIGH (ref 70–99)
Phosphorus: 3.8 mg/dL (ref 2.5–4.6)
Potassium: 4.1 mmol/L (ref 3.5–5.1)
Sodium: 134 mmol/L — ABNORMAL LOW (ref 135–145)

## 2019-06-06 LAB — GLUCOSE, CAPILLARY
Glucose-Capillary: 210 mg/dL — ABNORMAL HIGH (ref 70–99)
Glucose-Capillary: 224 mg/dL — ABNORMAL HIGH (ref 70–99)
Glucose-Capillary: 206 mg/dL — ABNORMAL HIGH (ref 70–99)
Glucose-Capillary: 194 mg/dL — ABNORMAL HIGH (ref 70–99)

## 2019-06-06 NOTE — TOC Progression Note (Addendum)
Transition of Care (TOC) - Progression Note  Marvetta Gibbons RN,BSN Transitions of Care Unit 4NP coverage - RN Case Manager [phone]   Patient Details  Name: [name] MRN: [id number] Date of Birth: 15-Jul-1956  Transition of Care [facility]) CM/SW Contact  Dahlia Client, Romeo Rabon, RN Phone Number: 06/06/2019, 1:22 PM  Clinical Narrative:    Pt tx from 4N ICU to 4NP- on 9/23- CIR has been consulted for possible admission- per admission coordinator note they will f/u with family to see if d/c support is adequate post CIR stay. Rehab admissions coordinator to f/u in am to determine if IP rehab stay is most appropriate coarse. CM will f/u.    Expected Discharge Plan: IP Rehab Facility Barriers to Discharge: Continued Medical Work up  Expected Discharge Plan and Services Expected Discharge Plan: [place]     Post Acute Care Choice: IP Rehab Living arrangements for the past 2 months: Single Family Home Expected Discharge Date: 04/22/19   TBD could be later this week                                   Social Determinants of Health (SDOH) Interventions    Readmission Risk Interventions No flowsheet data found.

## 2019-06-06 NOTE — Progress Notes (Signed)
Occupational Therapy Treatment Patient Details Name: [name] MRN: [id number] DOB: 01/12/1956 Today's Date: 06/06/2019    History of present illness Pt is a 63 y.o. F with significant PMH of DM Type 2, L eye cataract, glaucoma, breast cancer who presented for endoscopic endonasasl redo transphenoidal resection of a recurrent pituitary adenoma with intraoperative CSF leak operatively.  Pt is now s/p VP shunt placement and removal of lumbar drain.   OT comments  Pt continues to progress towards goals. Pt currently presents with decreased strength, balance, attention, short term memory, and problem solving skills. Pt performed functional mobility and toilet transfer with mod A and RW.  Pt performed oral care seated at sink with min guard A and mod VCs for safety, sequencing, and problem solving. Continue to recommend dc to CIR. Will continue to follow acutely as admitted.    Follow Up Recommendations  CIR;Supervision/Assistance - 24 hour    Equipment Recommendations  Other (comment)(Defer to next venue)    Recommendations for Other Services Rehab consult    Precautions / Restrictions Precautions Precautions: Fall       Mobility Bed Mobility Overal bed mobility: Needs Assistance Bed Mobility: Rolling;Sidelying to Sit Rolling: Mod assist Sidelying to sit: Mod assist       General bed mobility comments: required mod A and VCs to bring legs to EOB and to push up into sitting  Transfers Overall transfer level: Needs assistance Equipment used: Rolling walker (2 wheeled)   Sit to Stand: Mod assist         General transfer comment: required mod A to power up, posterior lean initially    Balance Overall balance assessment: Needs assistance Sitting-balance support: Feet supported Sitting balance-Leahy Scale: Fair     Standing balance support: Bilateral upper extremity supported;During functional activity Standing balance-Leahy Scale: Poor Standing balance comment:  required BUE support for balance during task, when no UE supported demonstrated posterior lean                           ADL either performed or assessed with clinical judgement   ADL Overall ADL's : Needs assistance/impaired     Grooming: Min guard;Cueing for sequencing;Sitting;Wash/dry face;Oral care Grooming Details (indicate cue type and reason): pt required mod verbal cues throughout task, cues to terminate task, and to turn off water             Lower Body Dressing: Maximal assistance;Sit to/from stand;Bed level Lower Body Dressing Details (indicate cue type and reason): RN provided max A to help pt don briefs in bed above knees. Pt moved EOB and performed sit<> stand with mod A to power up and maintain balance. Pt pulled up briefs over hips, required A to pull all the way up Toilet Transfer: Ambulation;BSC;RW;Moderate assistance;Min guard Toilet Transfer Details (indicate cue type and reason): pt required mod A to power up to stand, min guard during functional mobility         Functional mobility during ADLs: Rolling walker;Min guard General ADL Comments: required mod A - max A due to weakness and VCs for sequencing and problem solving during grooming     Vision       Perception     Praxis      Cognition Arousal/Alertness: Awake/alert Behavior During Therapy: Flat affect Overall Cognitive Status: Impaired/Different from baseline Area of Impairment: Attention;Memory;Following commands;Safety/judgement;Awareness;Problem solving  Current Attention Level: Sustained Memory: Decreased short-term memory Following Commands: Follows one step commands inconsistently;Follows one step commands with increased time Safety/Judgement: Decreased awareness of safety;Decreased awareness of deficits Awareness: Intellectual Problem Solving: Slow processing;Difficulty sequencing;Requires verbal cues;Requires tactile cues;Decreased  initiation General Comments: Pt is easily distracted, and demonstrated difficulty problem solving during oral care task.        Exercises     Shoulder Instructions       General Comments      Pertinent Vitals/ Pain       Pain Assessment: No/denies pain Pain Intervention(s): Monitored during session  Home Living                                          Prior Functioning/Environment              Frequency  Min 2X/week        Progress Toward Goals  OT Goals(current goals can now be found in the care plan section)  Progress towards OT goals: Progressing toward goals  Acute Rehab OT Goals Patient Stated Goal: not stated OT Goal Formulation: With patient Time For Goal Achievement: 06/16/19 Potential to Achieve Goals: Good ADL Goals Pt Will Perform Eating: with set-up;sitting Pt Will Perform Grooming: with min guard assist;standing Pt Will Perform Upper Body Bathing: with set-up;sitting Pt Will Perform Upper Body Dressing: with set-up;sitting Pt Will Perform Lower Body Dressing: with min assist;sit to/from stand Pt Will Transfer to Toilet: with min guard assist;ambulating;grab bars Pt Will Perform Toileting - Clothing Manipulation and hygiene: with min assist;sit to/from stand  Plan Discharge plan remains appropriate    Co-evaluation                 AM-PAC OT "6 Clicks" Daily Activity     Outcome Measure   Help from another person eating meals?: A Little Help from another person taking care of personal grooming?: A Little Help from another person toileting, which includes using toliet, bedpan, or urinal?: A Lot Help from another person bathing (including washing, rinsing, drying)?: A Lot Help from another person to put on and taking off regular upper body clothing?: A Little Help from another person to put on and taking off regular lower body clothing?: A Lot 6 Click Score: 15    End of Session Equipment Utilized During Treatment:  Rolling walker;Gait belt  OT Visit Diagnosis: Unsteadiness on feet (R26.81);Other abnormalities of gait and mobility (R26.89);Other symptoms and signs involving the nervous system (R29.898);Other symptoms and signs involving cognitive function   Activity Tolerance Patient tolerated treatment well   Patient Left in chair;with call bell/phone within reach;with chair alarm set   Nurse Communication Mobility status        Time: [id number] OT Time Calculation (min): 29 min  Charges: OT General Charges $OT Visit: 1 Visit OT Treatments $Self Care/Home Management : 23-37 mins  Gus Rankin, OT Student  Gus Rankin 06/06/2019, 10:27 AM

## 2019-06-06 NOTE — Progress Notes (Addendum)
Neurosurgery Service Progress Note  Subjective: No acute events overnight, no new complaints  Objective: Vitals:   06/05/19 2356 06/06/19 0433 06/06/19 0751 06/06/19 0839  BP: 130/76 139/87 115/78 116/78  Pulse: 91 85 85   Resp: 12 12 18    Temp: 100 F (37.8 C) 98.1 F (36.7 C) 98.9 F (37.2 C)   TempSrc: Oral Oral Oral   SpO2: 99% 99% 99%   Weight:      Height:       Temp (24hrs), Avg:99.3 F (37.4 C), Min:98.1 F (36.7 C), Max:100 F (37.8 C)  CBC Latest Ref Rng & Units 05/25/2019 05/24/2019 05/23/2019  WBC 4.0 - 10.5 K/uL 5.9 6.4 6.2  Hemoglobin 12.0 - 15.0 g/dL 8.5(L) 7.6(L) 7.8(L)  Hematocrit 36.0 - 46.0 % 28.8(L) 24.5(L) 26.5(L)  Platelets 150 - 400 K/uL 273 252 264   BMP Latest Ref Rng & Units 06/03/2019 06/01/2019 05/31/2019  Glucose 70 - 99 mg/dL 270(H) 234(H) 473(H)  BUN 8 - 23 mg/dL 11 9 -  Creatinine 0.44 - 1.00 mg/dL 1.17(H) 1.38(H) -  Sodium 135 - 145 mmol/L 143 141 -  Potassium 3.5 - 5.1 mmol/L 4.1 4.7 -  Chloride 98 - 111 mmol/L 111 114(H) -  CO2 22 - 32 mmol/L 21(L) 18(L) -  Calcium 8.9 - 10.3 mg/dL 9.5 10.0 -    Intake/Output Summary (Last 24 hours) at 06/06/2019 1013 Last data filed at 06/06/2019 [id number] Gross per 24 hour  Intake 240 ml  Output 1175 ml  Net -935 ml    Current Facility-Administered Medications:  .  0.9 %  sodium chloride infusion, , Intravenous, PRN, Judith Part, MD, Stopped at 05/25/19 1657 .  0.9 %  sodium chloride infusion, , Intravenous, Continuous, Tinzlee Craker, Joyice Faster, MD, Stopped at 06/01/19 [phone] .  acetaminophen (TYLENOL) tablet 650 mg, 650 mg, Oral, Q4H PRN, 650 mg at 05/24/19 2205 **OR** acetaminophen (TYLENOL) suppository 650 mg, 650 mg, Rectal, Q4H PRN, Judith Part, MD, 650 mg at 05/16/19 0746 .  alum & mag hydroxide-simeth (MAALOX/MYLANTA) 200-200-20 MG/5ML suspension 30 mL, 30 mL, Oral, Q6H PRN, Judith Part, MD, 30 mL at 05/05/19 1523 .  amLODipine (NORVASC) tablet 10 mg, 10 mg, Oral, Daily, Judith Part, MD, 10 mg at 06/06/19 0839 .  atorvastatin (LIPITOR) tablet 40 mg, 40 mg, Oral, Daily, Annalei Friesz, Joyice Faster, MD, 40 mg at 06/05/19 1728 .  Chlorhexidine Gluconate Cloth 2 % PADS 6 each, 6 each, Topical, Daily, Judith Part, MD, 6 each at 06/05/19 1700 .  docusate sodium (COLACE) capsule 100 mg, 100 mg, Oral, BID, Judith Part, MD, 100 mg at 06/06/19 0839 .  feeding supplement (BOOST / RESOURCE BREEZE) liquid 1 Container, 1 Container, Oral, TID BM, Judith Part, MD, 1 Container at 06/05/19 2108 .  feeding supplement (ENSURE ENLIVE) (ENSURE ENLIVE) liquid 237 mL, 237 mL, Oral, TID BM, Judith Part, MD, 237 mL at 06/05/19 2108 .  gabapentin (NEURONTIN) capsule 300 mg, 300 mg, Oral, BID, Judith Part, MD, 300 mg at 06/06/19 0840 .  Gerhardt's butt cream, , Topical, BID, Delan Ksiazek A, MD .  heparin injection 5,000 Units, 5,000 Units, Subcutaneous, Q8H, Judith Part, MD, 5,000 Units at 06/06/19 0526 .  HYDROcodone-acetaminophen (NORCO/VICODIN) 5-325 MG per tablet 1 tablet, 1 tablet, Oral, Q4H PRN, Judith Part, MD, 1 tablet at 06/01/19 2041 .  HYDROmorphone (DILAUDID) injection 0.5 mg, 0.5 mg, Intravenous, Q3H PRN, Judith Part, MD, 0.5 mg at 04/26/19 0939 .  insulin aspart (novoLOG) injection 0-20 Units, 0-20 Units, Subcutaneous, TID WC, Judith Part, MD, 7 Units at 06/06/19 [phone] .  insulin aspart (novoLOG) injection 0-5 Units, 0-5 Units, Subcutaneous, QHS, Judith Part, MD, 2 Units at 06/03/19 2147 .  iron polysaccharides (NIFEREX) capsule 150 mg, 150 mg, Oral, BID, Judith Part, MD, 150 mg at 06/06/19 0849 .  labetalol (NORMODYNE) injection 10-40 mg, 10-40 mg, Intravenous, Q10 min PRN, Judith Part, MD, 20 mg at 05/20/19 0310 .  lidocaine (PF) (XYLOCAINE) 1 % injection, , , PRN, Jacqulynn Cadet, MD, 10 mL at 04/19/19 1436 .  lip balm (CARMEX) ointment, , Topical, PRN, Judith Part, MD .   ondansetron (ZOFRAN) tablet 4 mg, 4 mg, Oral, Q4H PRN, 4 mg at 04/30/19 1043 **OR** ondansetron (ZOFRAN) injection 4 mg, 4 mg, Intravenous, Q4H PRN, Judith Part, MD, 4 mg at 05/16/19 0910 .  pantoprazole (PROTONIX) EC tablet 40 mg, 40 mg, Oral, Daily, Judith Part, MD, 40 mg at 06/06/19 0840 .  polyethylene glycol (MIRALAX / GLYCOLAX) packet 17 g, 17 g, Oral, Daily PRN, Judith Part, MD, 17 g at 06/05/19 0915 .  promethazine (PHENERGAN) injection 12.5-25 mg, 12.5-25 mg, Intravenous, Q4H PRN, Judith Part, MD, 25 mg at 05/15/19 2239 .  promethazine (PHENERGAN) tablet 12.5-25 mg, 12.5-25 mg, Oral, Q4H PRN, Judith Part, MD .  senna (SENOKOT) tablet 8.6 mg, 1 tablet, Oral, BID, Judith Part, MD, 8.6 mg at 06/06/19 0849 .  sodium chloride (OCEAN) 0.65 % nasal spray 4 spray, 4 spray, Each Nare, PRN, Judith Part, MD .  tamsulosin (FLOMAX) capsule 0.4 mg, 0.4 mg, Oral, Daily, Issaac Shipper, Joyice Faster, MD, 0.4 mg at 06/06/19 0839 .  tiZANidine (ZANAFLEX) tablet 4 mg, 4 mg, Oral, Q8H PRN, Judith Part, MD, 4 mg at 04/20/19 1554   Physical Exam: Awake, Ox2, PERRL, EOMI, FS, Strength 5/5 x4, SILTx4, VFF with mild bitemporal hemianopsia Scalp/relay site incisions healing well, abdominal incisions packed on R, healing well on L and subxiphoid  Assessment & Plan: 63 y.o. 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 Failed bedside lumbar drain placement. 8/11 LD placed under fluoro, minimal output, 8/14 LD removed; 8/16 rhinorrhea, LD replaced, 8/19 somnolence, CTH w/ large volume pneumocephalus, LD clamped, 8/21 s/p repeat endonasal skull base reconstruction, 8/25 rpt CTH with resolving pneumocephalus, febrile started on vanc/cefepime, 9/19 finished course of ABx, 9/22 s/p R VPS placement  & LD removal  -on tamsulosin -mental status continues to improve, will monitor overnight tonight and if stable or improved then okay for discharge tomorrow if CIR is ready -talked to family, who agree she is returning to baseline -SCDs/TEDs, Jackey Loge  06/06/19 10:13 AM

## 2019-06-06 NOTE — Progress Notes (Signed)
Physical Therapy Treatment Patient Details Name: [name] MRN: [id number] DOB: 05/15/1956 Today's Date: 06/06/2019    History of Present Illness Pt is a 63 y.o. F with significant PMH of DM Type 2, L eye cataract, glaucoma, breast cancer who presented for endoscopic endonasasl redo transphenoidal resection of a recurrent pituitary adenoma with intraoperative CSF leak operatively.  Pt is now s/p VP shunt placement and removal of lumbar drain.    PT Comments    Pt more alert and conversant. She required mod assist bed mobility and mod assist sit to stand with RW. Marching in place in stance with RW and mod assist. Pt performed BLE exercises in sitting and supine. Pt in recliner with feet elevated at end of session.    Follow Up Recommendations  CIR;Supervision/Assistance - 24 hour     Equipment Recommendations  Rolling walker with 5" wheels    Recommendations for Other Services       Precautions / Restrictions Precautions Precautions: Fall    Mobility  Bed Mobility Overal bed mobility: Needs Assistance Bed Mobility: Supine to Sit     Supine to sit: Mod assist        Transfers Overall transfer level: Needs assistance Equipment used: Rolling walker (2 wheeled) Transfers: Sit to/from Stand Sit to Stand: Mod assist         General transfer comment: cues for hand placement, increased time, assist to power up and stabilize balance  Ambulation/Gait             General Gait Details: pre-gait activities: marching in place with RW x 15 reps, mod assist to maintain balance, cues for sequencing   Stairs             Wheelchair Mobility    Modified Rankin (Stroke Patients Only)       Balance Overall balance assessment: Needs assistance Sitting-balance support: Feet supported;No upper extremity supported Sitting balance-Leahy Scale: Fair     Standing balance support: Bilateral upper extremity supported;During functional activity Standing  balance-Leahy Scale: Poor Standing balance comment: reliant on RW and therapist support                            Cognition Arousal/Alertness: Awake/alert Behavior During Therapy: Flat affect Overall Cognitive Status: Impaired/Different from baseline Area of Impairment: Attention;Memory;Following commands;Safety/judgement;Awareness;Problem solving                 Orientation Level: Disoriented to;Place;Time;Situation Current Attention Level: Sustained Memory: Decreased short-term memory Following Commands: Follows one step commands inconsistently;Follows one step commands with increased time Safety/Judgement: Decreased awareness of safety;Decreased awareness of deficits Awareness: Intellectual Problem Solving: Slow processing;Difficulty sequencing;Requires verbal cues;Requires tactile cues;Decreased initiation General Comments: Easily distracted. Drifts off after a few minutes attending to a task.      Exercises General Exercises - Lower Extremity Ankle Circles/Pumps: AROM;Both;20 reps;Seated Long Arc Quad: AROM;Right;Left;10 reps;Seated Hip ABduction/ADduction: AROM;Right;Left;10 reps;Supine Hip Flexion/Marching: AROM;Right;Left;10 reps;15 reps;Seated;Standing    General Comments        Pertinent Vitals/Pain Pain Assessment: No/denies pain    Home Living                      Prior Function            PT Goals (current goals can now be found in the care plan section) Acute Rehab PT Goals Patient Stated Goal: not stated PT Goal Formulation: With patient Time For Goal Achievement: 06/16/19 Potential to  Achieve Goals: Good Progress towards PT goals: Progressing toward goals    Frequency    Min 3X/week      PT Plan Current plan remains appropriate    Co-evaluation              AM-PAC PT "6 Clicks" Mobility   Outcome Measure  Help needed turning from your back to your side while in a flat bed without using bedrails?: A  Little Help needed moving from lying on your back to sitting on the side of a flat bed without using bedrails?: A Lot Help needed moving to and from a bed to a chair (including a wheelchair)?: A Lot Help needed standing up from a chair using your arms (e.g., wheelchair or bedside chair)?: A Lot Help needed to walk in hospital room?: A Lot Help needed climbing 3-5 steps with a railing? : Total 6 Click Score: 12    End of Session Equipment Utilized During Treatment: Gait belt Activity Tolerance: Patient tolerated treatment well Patient left: in chair;with call bell/phone within reach;with chair alarm set Nurse Communication: Mobility status PT Visit Diagnosis: Muscle weakness (generalized) (M62.81);Difficulty in walking, not elsewhere classified (R26.2)     Time: [id number] PT Time Calculation (min) (ACUTE ONLY): 26 min  Charges:  $Therapeutic Exercise: 8-22 mins $Therapeutic Activity: 8-22 mins                     Lorrin Goodell, PT  Office # [phone] Pager [phone]    Lorriane Shire 06/06/2019, 1:45 PM

## 2019-06-06 NOTE — Progress Notes (Signed)
Inpatient Rehabilitation-Admissions Coordinator   Spoke with pt's sister Margaretha Sheffield this morning to determine if pt has the caregiver assistance at [place] to support an IP Rehab stay. Per PM&R consult note by Dr. Posey Pronto, this patient will need supervision at DC. The patient currently lives alone. Per Margaretha Sheffield, she will discuss plans for support tonight with the rest of her family. Margaretha Sheffield will follow up with me first thing tomorrow morning and AC will determine if IP Rehab is most appropriate venue.  Please call if questions.   Jhonnie Garner, OTR/L  Rehab Admissions Coordinator  [phone] 06/06/2019 10:52 AM

## 2019-06-07 LAB — GLUCOSE, CAPILLARY
Glucose-Capillary: 175 mg/dL — ABNORMAL HIGH (ref 70–99)
Glucose-Capillary: 169 mg/dL — ABNORMAL HIGH (ref 70–99)
Glucose-Capillary: 190 mg/dL — ABNORMAL HIGH (ref 70–99)
Glucose-Capillary: 170 mg/dL — ABNORMAL HIGH (ref 70–99)

## 2019-06-07 NOTE — Progress Notes (Signed)
Inpatient Rehabilitation-Admissions Coordinator   Confirmed with family this AM that pt will have necessary supervision at DC to support an IP rehab stay. AC will begin insurance authorization process for possible admit. Will follow up once there is a determination.   Please call if questions.   Jhonnie Garner, OTR/L  Rehab Admissions Coordinator  [phone] 06/07/2019 10:51 AM

## 2019-06-07 NOTE — PMR Pre-admission (Signed)
PMR Admission Coordinator Pre-Admission Assessment  Patient: [name] is an 63 y.o., female MRN: [id number] DOB: 04/28/1956 Height: '5\' 6"'$  (167.6 cm) Weight: 107.5 kg              Insurance Information HMO:     PPO: yes     PCP:      IPA:      80/20:      OTHER:  PRIMARY: [employer] [place]      Policy#: [id number]      Subscriber: Patient CM Name: Santiago Glad      Phone#: [phone]     Fax#: [phone] Pre-Cert#: [id number]      Employer:  Josem Kaufmann provided by Santiago Glad for admit to [facility] 10/3. Pt is approved for 7 days.Follow up CM is Lornette (p): [phone] (f): [phone]. **CM is requesting evaluations be faxed by Monday (10/5) for updates.  Benefits:  Phone #: [phone]     Name: (have spoken to Prudencio Burly) ref [id number] Eff. Date: 09/08/2018 - still active     Deduct: $3,000 ($3,000 met)      Out of Pocket Max: $5,000 [phone] met)      Life Max: NA *CIR: $500/admission co-pay, then 100% after deductible; limited to 60 days       SNF: $500/admission co-pay, then 100% after deductible; limited to 60 days  Outpatient: limited to 30 visits per cal yr combined PT/OT/ST      Co-Pay: $30/visit co-pay Home Health: limited to 40 visits per cal year      Co-Pay: $30/visit per co-pay DME: coverage depends on item, but generally is 50% coverage, 50% co-insurance   Providers:   *multiple conversations with RN Case Managers/customer service reps from [employer] company regarding insurance benefits. Santiago Glad [facility]) (p): [phone] who referenced initial benefits check conversation through customer service ref #: [id number], Gershon Mussel [facility] supervisor) (p) [phone], and Prudencio Burly. (customer service): reference #: [id number] that indicate that the pt has separate inpatient hospital benefits from inpatient rehab benefits and that no inpatient rehab benefits have been used to date. Pt's sister Margaretha Sheffield) aware of multiple conversations and is aware of initial discrepancy in benefits information.  Margaretha Sheffield has been in contact with Gershon Mussel [facility]) and have confirmed the patient has inpatient rehab benefits available. Margaretha Sheffield is aware that if the patient does not have inpatient rehab benefits at this time, that the patient will be responsible for her rehab stay without insurance coverage.   SECONDARY:  None     Policy#:       Subscriber:  CM Name:       Phone#:      Fax#:  Pre-Cert#:       Employer:  Benefits:  Phone #:      Name:  Eff. Date:      Deduct:       Out of Pocket Max:       Life Max:  CIR:       SNF:  Outpatient:      Co-Pay:  Home Health:       Co-Pay:  DME:      Co-Pay:   Medicaid Application Date:       Case Manager:  Disability Application Date:       Case Worker:   The "Data Collection Information Summary" for patients in Inpatient Rehabilitation Facilities with attached "[place] Records" was provided and verbally reviewed with: N/A  Emergency Contact Information Contact Information    Name Relation Home Work Mobile  Charvi, Gammage Daughter   [phone]   Currie,Ben Brother   [phone]   haynie,elanie Sister   [phone]     Current Medical History  Patient Admitting Diagnosis: Ventriculitis with hydrocephalus  History of Present Illness: [name] is a 63 y.o. 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  History taken from chart review due to cognition.  She was admitted on 04/15/2019 for repeat endoscopic transphenoidal resection of tumor by Drs. Ostergard and [employer]. Post op course complicated by fevers, confusion, cognitive deficits, visual disturbance within the right temporal field, nasal drainage, mobility.  She was taken to OR for lumbar drain placement on 04/24/2019.  Follow-up head CT on 04/27/2019 showed large  pneumocephalus with mass-effect on the right frontal lobe and diffuse cerebral sulcal effacement as well as posterior displacement of brainstem.  This was thought to be secondary to train and therefore drain was clamped. She was taken back to OR on 04/29/2019 for repair of CSF leak with abdominal wound revision. On 05/04/2019 she developed recurrent fever with tachycardia. Was monitored on empiric antibiotics, Lumbar drain clamped as drainage resolved but on 8/31, she had recurrent drainage therefore placed on bedrest. On 09/06, she developed hypoxia with fever and tachycardia with decline in mental status. CSF culture showed 830 WBC with CSF protein -488.  ID consulted for input and she was started on Vanc/Cefepime for bacterial meningitis.  She continued to have fevers and antibiotic coverage broadened. MRI brain revealing small volume infectious debris layering in lateral and 4th ventricle and moderate to severe paranasal sinus with superimposed moderate to severe paranasal sinus mucosal thickening. CSF cultures and blood cultures negative and ID question ENT evaluation of sinuses as well as shunt revision. Dr. Zada Finders felt that changes due to ventriculitis. Patient developed lethargy with attempts at LD clamping on 05/25/2019 therefore drain unclamped. She continued to have decline in MS with CT head showing concerns of hydrocephalus. Antibiotics discontinued on 9/18 as she was afebrile X 72 hours.  She was taken to the OR again on 05/31/2023 removal of lumbar drain and placement of VPS.  She tolerated extubation without difficulty and therapy evaluations completed yesterday revealing significant cognitive deficits, apraxia with ADL tasks, difficulty initiating RLE with mobility. CIR recommended due to functional decline.  Pt's mental status continues to improve. Pt is to be admitted to CIR on 06/11/2019.      Glasgow Coma Scale Score: 15  Past Medical History  Past Medical History:  Diagnosis Date  .  Anemia   . Breast cancer ([place]) 12/29/13   right 6:00 o'clock, lower outer  . Cataract of left eye   . Diabetes mellitus    type 2  . Family history of malignant neoplasm of breast   . Glaucoma    MD just watching, no eye drops  . High cholesterol   . History of blood transfusion 2015   with chemo treatments  . History of radiation therapy 08/12/14- 10/11/14   right breast /50.4 Gy/28 fx, right breast boost/ 10 Gy/ 5 fx  . HTN (hypertension)   . Personal history of chemotherapy   . Personal history of radiation therapy   . Pituitary adenoma ([place]) 10/30/2018  . Wears dentures    full top-partial bottom  . Wears dentures    full  . Wears glasses   .  Wears glasses     Family History  family history includes Breast cancer in her maternal aunt and mother; Cancer in her maternal uncle and maternal uncle; Diabetes type II in her mother; Hypertension in her father and mother; Uterine cancer (age of onset: 18) in her cousin.  Prior Rehab/Hospitalizations:  Has the patient had prior rehab or hospitalizations prior to admission? Yes  Has the patient had major surgery during 100 days prior to admission? Yes  Current Medications   Current Facility-Administered Medications:  .  0.9 %  sodium chloride infusion, , Intravenous, PRN, Judith Part, MD, Stopped at 05/25/19 1657 .  0.9 %  sodium chloride infusion, , Intravenous, Continuous, Ostergard, Joyice Faster, MD, Stopped at 06/01/19 [phone] .  acetaminophen (TYLENOL) tablet 650 mg, 650 mg, Oral, Q4H PRN, 650 mg at 05/24/19 2205 **OR** acetaminophen (TYLENOL) suppository 650 mg, 650 mg, Rectal, Q4H PRN, Judith Part, MD, 650 mg at 05/16/19 0746 .  alum & mag hydroxide-simeth (MAALOX/MYLANTA) 200-200-20 MG/5ML suspension 30 mL, 30 mL, Oral, Q6H PRN, Judith Part, MD, 30 mL at 05/05/19 1523 .  amLODipine (NORVASC) tablet 10 mg, 10 mg, Oral, Daily, Judith Part, MD, 10 mg at 06/10/19 0914 .  atorvastatin (LIPITOR) tablet 40 mg,  40 mg, Oral, Daily, Judith Part, MD, 40 mg at 06/10/19 1731 .  docusate sodium (COLACE) capsule 100 mg, 100 mg, Oral, BID, Judith Part, MD, 100 mg at 06/10/19 2131 .  feeding supplement (BOOST / RESOURCE BREEZE) liquid 1 Container, 1 Container, Oral, TID BM, Judith Part, MD, 1 Container at 06/10/19 1442 .  feeding supplement (ENSURE ENLIVE) (ENSURE ENLIVE) liquid 237 mL, 237 mL, Oral, TID BM, Judith Part, MD, 237 mL at 06/10/19 2023 .  gabapentin (NEURONTIN) capsule 300 mg, 300 mg, Oral, BID, Judith Part, MD, 300 mg at 06/10/19 2131 .  Gerhardt's butt cream, , Topical, BID, Ostergard, Thomas A, MD .  heparin injection 5,000 Units, 5,000 Units, Subcutaneous, Q8H, Judith Part, MD, 5,000 Units at 06/10/19 2132 .  HYDROcodone-acetaminophen (NORCO/VICODIN) 5-325 MG per tablet 1 tablet, 1 tablet, Oral, Q4H PRN, Judith Part, MD, 1 tablet at 06/01/19 2041 .  HYDROmorphone (DILAUDID) injection 0.5 mg, 0.5 mg, Intravenous, Q3H PRN, Judith Part, MD, 0.5 mg at 04/26/19 0939 .  insulin aspart (novoLOG) injection 0-20 Units, 0-20 Units, Subcutaneous, TID WC, Ostergard, Joyice Faster, MD, 7 Units at 06/10/19 1730 .  insulin aspart (novoLOG) injection 0-5 Units, 0-5 Units, Subcutaneous, QHS, Judith Part, MD, 2 Units at 06/08/19 2205 .  iron polysaccharides (NIFEREX) capsule 150 mg, 150 mg, Oral, BID, Ostergard, Thomas A, MD, 150 mg at 06/10/19 2135 .  labetalol (NORMODYNE) injection 10-40 mg, 10-40 mg, Intravenous, Q10 min PRN, Judith Part, MD, 20 mg at 05/20/19 0310 .  lidocaine (PF) (XYLOCAINE) 1 % injection, , , PRN, Jacqulynn Cadet, MD, 10 mL at 04/19/19 1436 .  lip balm (CARMEX) ointment, , Topical, PRN, Judith Part, MD .  ondansetron (ZOFRAN) tablet 4 mg, 4 mg, Oral, Q4H PRN, 4 mg at 04/30/19 1043 **OR** ondansetron (ZOFRAN) injection 4 mg, 4 mg, Intravenous, Q4H PRN, Judith Part, MD, 4 mg at 05/16/19 0910 .   pantoprazole (PROTONIX) EC tablet 40 mg, 40 mg, Oral, Daily, Judith Part, MD, 40 mg at 06/10/19 0914 .  polyethylene glycol (MIRALAX / GLYCOLAX) packet 17 g, 17 g, Oral, Daily PRN, Judith Part, MD, 17 g at 06/05/19 0915 .  promethazine (PHENERGAN) injection 12.5-25 mg, 12.5-25 mg, Intravenous, Q4H PRN, Judith Part, MD, 25 mg at 05/15/19 2239 .  promethazine (PHENERGAN) tablet 12.5-25 mg, 12.5-25 mg, Oral, Q4H PRN, Judith Part, MD .  senna (SENOKOT) tablet 8.6 mg, 1 tablet, Oral, BID, Judith Part, MD, 8.6 mg at 06/10/19 2131 .  sodium chloride (OCEAN) 0.65 % nasal spray 4 spray, 4 spray, Each Nare, PRN, Ostergard, Thomas A, MD .  tiZANidine (ZANAFLEX) tablet 4 mg, 4 mg, Oral, Q8H PRN, Judith Part, MD, 4 mg at 04/20/19 1554  Patients Current Diet:  Diet Order            Diet Carb Modified Fluid consistency: Thin; Room service appropriate? 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  Independent  Indoor Mobility: Did the patient need assistance with walking from room to room (with or without device)? Independent  Stairs: Did the patient need assistance with internal or external stairs (with or without device)? Independent  Functional Cognition: Did the patient need help planning regular tasks such as shopping or remembering to take medications? Independent  Home Assistive Devices / Equipment Home Assistive Devices/Equipment: CBG Meter, Eyeglasses, Hand-held  shower hose, Dentures (specify type) Home Equipment: None  Prior Device Use: Indicate devices/aids used by the patient prior to current illness, exacerbation or injury? None of the above  Current Functional Level Cognition  Overall Cognitive Status: Impaired/Different from baseline Current Attention Level: Sustained Orientation Level: Oriented to person, Oriented to place, Oriented to situation, Disoriented to time Following Commands: Follows one step commands with increased time Safety/Judgement: Decreased awareness of safety, Decreased awareness of deficits General Comments: When asked what day of the week it is, pt responds with "1, no 11." Decreased short term memory recall about day's events, decreased attention span. States her daughter is outside the room (which she was not).    Extremity Assessment (includes Sensation/Coordination)  Upper Extremity Assessment: Generalized weakness  Lower Extremity Assessment: Defer to PT evaluation    ADLs  Overall ADL's : Needs assistance/impaired Eating/Feeding: Minimal assistance, Cueing for sequencing, Sitting Eating/Feeding Details (indicate cue type and reason): Pt required cues to cut food before eating and to open ice cream before attempting to eat it.   Grooming: Min guard, Cueing for sequencing, Wash/dry face, Oral care, Standing, Cueing for safety Grooming Details (indicate cue type and reason): Pt min guard for standing grooming at sink with RW; pt requires MIN cues for safety/ sequencing of task as pt asking if her toothpaste with vaseline Upper Body Bathing: Moderate assistance, Sitting, Cueing for sequencing Lower Body Bathing: Maximal assistance, Sit to/from stand, Cueing for compensatory techniques, Cueing for sequencing Lower Body Bathing Details (indicate cue type and reason): Pt fatigues quickly. Requires cues to continue the task and stay focused.   Upper Body Dressing : Moderate assistance, Sitting Lower Body Dressing:  Maximal assistance, Sit to/from stand, Bed level Lower Body Dressing Details (indicate cue type and reason): RN provided max  A to help pt don briefs in bed above knees. Pt moved EOB and performed sit<> stand with mod A to power up and maintain balance. Pt pulled up briefs over hips, required A to pull all the way up Toilet Transfer: Ambulation, RW, Min guard, Cueing for safety, Cueing for sequencing Toilet Transfer Details (indicate cue type and reason): simulated from recliner> EOB; Min guard for safety; cues for hand placement on RW; cues for sequencing of task as pt trying to walk away w/o RW Toileting- Clothing Manipulation and Hygiene: Total assistance, Sit to/from stand Toileting - Clothing Manipulation Details (indicate cue type and reason): Therapist assists pt with standing while second person manages clothing. Functional mobility during ADLs: Rolling walker, Min guard General ADL Comments: MIN A- MIN guard for ADLs; cues for safety during functional mobility; cues for sequencing of task for standing ADLs    Mobility  Overal bed mobility: Needs Assistance Bed Mobility: Sit to Supine Rolling: Mod assist Sidelying to sit: Mod assist Supine to sit: Min guard Sit to supine: Min assist General bed mobility comments: Increased time    Transfers  Overall transfer level: Needs assistance Equipment used: Rolling walker (2 wheeled) Transfers: Sit to/from Stand Sit to Stand: Min assist, Mod assist Stand pivot transfers: Mod assist General transfer comment: MinA to rise from edge of bed, modA from toilet    Ambulation / Gait / Stairs / Wheelchair Mobility  Ambulation/Gait Ambulation/Gait assistance: Counsellor (Feet): 15 Feet Assistive device: Rolling walker (2 wheeled) Gait Pattern/deviations: Step-through pattern, Decreased stride length General Gait Details: Slow speed, bilateral knee instability, increased lateral sway and trunk flexion Gait velocity: decreased Gait  velocity interpretation: <1.8 ft/sec, indicate of risk for recurrent falls    Posture / Balance Dynamic Sitting Balance Sitting balance - Comments: able to sit EOB with S Balance Overall balance assessment: Needs assistance Sitting-balance support: Feet supported, No upper extremity supported Sitting balance-Leahy Scale: Fair Sitting balance - Comments: able to sit EOB with S Standing balance support: During functional activity Standing balance-Leahy Scale: Poor Standing balance comment: posterior LOB with washing at sink    Special needs/care consideration BiPAP/CPAP: no CPM: no Continuous Drip IV: 0.9% sodium chloride infusion  Dialysis: no        Days: no Life Vest: no Oxygen: on RA Special Bed: no Trach Size: no Wound Vac (area): no      Location: no Skin: area on right side of abdomen dehisced (surgical incision), back incision ; MASD to right and left upper thigh, closed head incision Bowel mgmt: last BM: 06/09/2019, incontinent  Bladder mgmt: incontinent, external urinary catheter Diabetic mgmt: yes Behavioral consideration : no Chemo/radiation : not currently (has had history prior but not within a least a year)     Previous Home Environment (from acute therapy documentation) Living Arrangements: Alone Available Help at Discharge: Family, Available PRN/intermittently Type of Home: House Home Layout: One level Home Access: Level entry Bathroom Shower/Tub: Chiropodist: Standard Home Care Services: No Additional Comments: 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  Totally independent.    Discharge Living Setting Plans for Discharge Living Setting: Other (Comment)(will have family 24/7 for first few weeks at DC (Dad's home)) Type of Home at Discharge: House Discharge Home Layout: One level Discharge Home Access: Stairs to enter Entrance Stairs-Rails: Can reach both Entrance Stairs-Number of Steps: 3 Discharge Bathroom Shower/Tub: Walk-in  shower Discharge Bathroom Toilet: Standard Discharge Bathroom Accessibility: Yes How Accessible: Accessible via wheelchair, Accessible via  walker Does the patient have any problems obtaining your medications?: No  Social/Family/Support Systems Patient Roles: Other (Comment)(has many siblings, daughter, 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: sister Margaretha Sheffield: [phone]; Daughter [name]: [phone] *Sister has been main contact regarding rehab info and DC support Anticipated Caregiver: sister, brother, daughter, father (all plan to take shifts with pt staying at her father's house as it is more accessible) Anticipated Caregiver's Contact Information: see above Ability/Limitations of Caregiver: supervision Caregiver Availability: 24/7 Discharge Plan Discussed with Primary Caregiver: Yes(pt and sister Margaretha Sheffield)) Is Caregiver In Agreement with Plan?: Yes Does Caregiver/Family have Issues with Lodging/Transportation while Pt is in Rehab?: No   Goals/Additional Needs Patient/Family Goal for Rehab: PT/OT/SLP: Supervision Expected length of stay: 10-14 days Cultural Considerations: NA  Dietary Needs: carb modified, thin liquids; calorie level: Medium 1600-2000 Equipment Needs: TBD Pt/Family Agrees to Admission and willing to participate: Yes Program Orientation Provided & Reviewed with Pt/Caregiver Including Roles  & Responsibilities: Yes(pt and sister Margaretha Sheffield)  Barriers to Discharge: Home environment access/layout, Insurance for SNF coverage  Barriers to Discharge Comments: stairs to enter   Decrease burden of Care through IP rehab admission: NA   Possible need for SNF placement upon discharge:Not anticipated. Family aware pt must return home after CIR stay and they have established a plan for 24/7 support at home. Pt will DC to her father's house as it is more accessible with a bathroom that can support a wc if need be.    Patient Condition: This patient's medical and functional status  has changed since the consult dated 06/03/2019 in which the Rehabilitation Physician determined and documented that the patient was potentially appropriate for intensive rehabilitative care in an inpatient rehabilitation facility. Pt is now more alert and participatory with therapies as she is now beginning to work on gait and participating in conversations with [facility]. Family support has been confirmed at [place] with family arranging for 24/7 support between family members. Issues have been addressed and update has been discussed with Dr. Dagoberto Ligas and patient now appropriate for inpatient rehabilitation. Will admit to inpatient rehab 06/11/2019  Preadmission Screen Completed By:  Raechel Ache, OT, 06/10/2019 9:43 PM ______________________________________________________________________   Discussed status with Dr. Dagoberto Ligas on 06/10/2019 at 5:00PM and received approval for admission today.  Admission Coordinator:  Raechel Ache, time 9:43PM/Date 06/10/2019

## 2019-06-07 NOTE — Progress Notes (Signed)
Neurosurgery Service Progress Note  Subjective: No acute events overnight, no new complaints  Objective: Vitals:   06/06/19 1620 06/06/19 1938 06/06/19 2308 06/07/19 0328  BP: 120/72 99/61 117/73 129/75  Pulse: 88 86 82 81  Resp: 20     Temp: 98.1 F (36.7 C) 99.2 F (37.3 C) 99.2 F (37.3 C) 98.1 F (36.7 C)  TempSrc: Oral Oral Oral Oral  SpO2: 100% 99% 99% 100%  Weight:      Height:       Temp (24hrs), Avg:98.7 F (37.1 C), Min:98.1 F (36.7 C), Max:99.2 F (37.3 C)  CBC Latest Ref Rng & Units 05/25/2019 05/24/2019 05/23/2019  WBC 4.0 - 10.5 K/uL 5.9 6.4 6.2  Hemoglobin 12.0 - 15.0 g/dL 8.5(L) 7.6(L) 7.8(L)  Hematocrit 36.0 - 46.0 % 28.8(L) 24.5(L) 26.5(L)  Platelets 150 - 400 K/uL 273 252 264   BMP Latest Ref Rng & Units 06/06/2019 06/03/2019 06/01/2019  Glucose 70 - 99 mg/dL 182(H) 270(H) 234(H)  BUN 8 - 23 mg/dL 15 11 9   Creatinine 0.44 - 1.00 mg/dL 1.15(H) 1.17(H) 1.38(H)  Sodium 135 - 145 mmol/L 134(L) 143 141  Potassium 3.5 - 5.1 mmol/L 4.1 4.1 4.7  Chloride 98 - 111 mmol/L 99 111 114(H)  CO2 22 - 32 mmol/L 22 21(L) 18(L)  Calcium 8.9 - 10.3 mg/dL 9.4 9.5 10.0    Intake/Output Summary (Last 24 hours) at 06/07/2019 [id number] Last data filed at 06/06/2019 1702 Gross per 24 hour  Intake -  Output 700 ml  Net -700 ml    Current Facility-Administered Medications:  .  0.9 %  sodium chloride infusion, , Intravenous, PRN, Judith Part, MD, Stopped at 05/25/19 1657 .  0.9 %  sodium chloride infusion, , Intravenous, Continuous, Caroleena Paolini, Joyice Faster, MD, Stopped at 06/01/19 [phone] .  acetaminophen (TYLENOL) tablet 650 mg, 650 mg, Oral, Q4H PRN, 650 mg at 05/24/19 2205 **OR** acetaminophen (TYLENOL) suppository 650 mg, 650 mg, Rectal, Q4H PRN, Judith Part, MD, 650 mg at 05/16/19 0746 .  alum & mag hydroxide-simeth (MAALOX/MYLANTA) 200-200-20 MG/5ML suspension 30 mL, 30 mL, Oral, Q6H PRN, Judith Part, MD, 30 mL at 05/05/19 1523 .  amLODipine (NORVASC) tablet  10 mg, 10 mg, Oral, Daily, Judith Part, MD, 10 mg at 06/06/19 0839 .  atorvastatin (LIPITOR) tablet 40 mg, 40 mg, Oral, Daily, Judith Part, MD, 40 mg at 06/06/19 1751 .  Chlorhexidine Gluconate Cloth 2 % PADS 6 each, 6 each, Topical, Daily, Judith Part, MD, 6 each at 06/06/19 1702 .  docusate sodium (COLACE) capsule 100 mg, 100 mg, Oral, BID, Judith Part, MD, 100 mg at 06/06/19 2052 .  feeding supplement (BOOST / RESOURCE BREEZE) liquid 1 Container, 1 Container, Oral, TID BM, Judith Part, MD, 1 Container at 06/06/19 2015 .  feeding supplement (ENSURE ENLIVE) (ENSURE ENLIVE) liquid 237 mL, 237 mL, Oral, TID BM, Judith Part, MD, 237 mL at 06/06/19 2015 .  gabapentin (NEURONTIN) capsule 300 mg, 300 mg, Oral, BID, Judith Part, MD, 300 mg at 06/06/19 2053 .  Gerhardt's butt cream, , Topical, BID, Jodelle Fausto A, MD .  heparin injection 5,000 Units, 5,000 Units, Subcutaneous, Q8H, Judith Part, MD, 5,000 Units at 06/07/19 0509 .  HYDROcodone-acetaminophen (NORCO/VICODIN) 5-325 MG per tablet 1 tablet, 1 tablet, Oral, Q4H PRN, Judith Part, MD, 1 tablet at 06/01/19 2041 .  HYDROmorphone (DILAUDID) injection 0.5 mg, 0.5 mg, Intravenous, Q3H PRN, Judith Part, MD, 0.5 mg at  04/26/19 0939 .  insulin aspart (novoLOG) injection 0-20 Units, 0-20 Units, Subcutaneous, TID WC, Yuvia Plant, Joyice Faster, MD, 7 Units at 06/06/19 1751 .  insulin aspart (novoLOG) injection 0-5 Units, 0-5 Units, Subcutaneous, QHS, Judith Part, MD, 2 Units at 06/03/19 2147 .  iron polysaccharides (NIFEREX) capsule 150 mg, 150 mg, Oral, BID, Judith Part, MD, 150 mg at 06/06/19 2053 .  labetalol (NORMODYNE) injection 10-40 mg, 10-40 mg, Intravenous, Q10 min PRN, Judith Part, MD, 20 mg at 05/20/19 0310 .  lidocaine (PF) (XYLOCAINE) 1 % injection, , , PRN, Jacqulynn Cadet, MD, 10 mL at 04/19/19 1436 .  lip balm (CARMEX) ointment, , Topical,  PRN, Judith Part, MD .  ondansetron (ZOFRAN) tablet 4 mg, 4 mg, Oral, Q4H PRN, 4 mg at 04/30/19 1043 **OR** ondansetron (ZOFRAN) injection 4 mg, 4 mg, Intravenous, Q4H PRN, Judith Part, MD, 4 mg at 05/16/19 0910 .  pantoprazole (PROTONIX) EC tablet 40 mg, 40 mg, Oral, Daily, Judith Part, MD, 40 mg at 06/06/19 0840 .  polyethylene glycol (MIRALAX / GLYCOLAX) packet 17 g, 17 g, Oral, Daily PRN, Judith Part, MD, 17 g at 06/05/19 0915 .  promethazine (PHENERGAN) injection 12.5-25 mg, 12.5-25 mg, Intravenous, Q4H PRN, Judith Part, MD, 25 mg at 05/15/19 2239 .  promethazine (PHENERGAN) tablet 12.5-25 mg, 12.5-25 mg, Oral, Q4H PRN, Judith Part, MD .  senna (SENOKOT) tablet 8.6 mg, 1 tablet, Oral, BID, Judith Part, MD, 8.6 mg at 06/06/19 2052 .  sodium chloride (OCEAN) 0.65 % nasal spray 4 spray, 4 spray, Each Nare, PRN, Judith Part, MD .  tamsulosin (FLOMAX) capsule 0.4 mg, 0.4 mg, Oral, Daily, Lesette Frary, Joyice Faster, MD, 0.4 mg at 06/06/19 0839 .  tiZANidine (ZANAFLEX) tablet 4 mg, 4 mg, Oral, Q8H PRN, Judith Part, MD, 4 mg at 04/20/19 1554   Physical Exam: Awake, Ox2, PERRL, EOMI, FS, Strength 5/5 x4, SILTx4, VFF with mild bitemporal hemianopsia Scalp/relay site incisions healing well, abdominal incisions packed on R, healing well on L and subxiphoid  Assessment & Plan: 63 y.o. 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 Failed bedside lumbar drain placement. 8/11 LD placed under fluoro, minimal output, 8/14 LD removed; 8/16 rhinorrhea, LD replaced, 8/19 somnolence, CTH w/ large volume pneumocephalus, LD clamped, 8/21 s/p repeat endonasal skull base reconstruction, 8/25 rpt CTH with resolving pneumocephalus, febrile started on vanc/cefepime, 9/19 finished course  of ABx, 9/22 s/p R VPS placement & LD removal  -on tamsulosin for urinary retention -mental status continues to improve, okay from a neurosurgical perspective for discharge to CIR if accepted -SCDs/TEDs, SQH  Judith Part  06/07/19 8:22 AM

## 2019-06-07 NOTE — Progress Notes (Signed)
Nutrition Follow-up  DOCUMENTATION CODES:   Obesity unspecified  INTERVENTION:  Continue Ensure Enlive po TID, each supplement provides 350 kcal and 20 grams of protein.  Continue Boost Breeze po TID, each supplement provides 250 kcal and 9 grams of protein  Encourage adequate PO intake.   NUTRITION DIAGNOSIS:   Inadequate oral intake related to decreased appetite as evidenced by meal completion < 50%; improving  GOAL:   Patient will meet greater than or equal to 90% of their needs; met  MONITOR:   PO intake, Supplement acceptance  REASON FOR ASSESSMENT:   Rounds    ASSESSMENT:   Pt with PMH of DM, HTN, pituitary adenomawith bacterial meningitis earlier this year now admitted for repeat endoscopic endonasal resection of recurrent pituitary adenoma.  Pt with CSF leak s/p multi-layered repair but developed post-op CSF rhinorrhea.   8/29 - lumbar drain removed 9/7 - lumbar drain placement 9/22 - Right ventriculoperitoneal shunt placement, removal of lumbar drain  Meal completion has been varied from 25-70%. Pt currently has Ensure and Boost Breeze ordered and has been consuming most of them. RD to continue with current orders to aid in increased caloric and protein needs. Labs and medications reviewed.   Diet Order:   Diet Order            Diet Carb Modified Fluid consistency: Thin; Room service appropriate? Yes  Diet effective now              EDUCATION NEEDS:   Education needs have been addressed  Skin:  Skin Assessment: Reviewed RN Assessment Skin Integrity Issues:: Incisions, Other (Comment) Incisions: closed back, abdomen, and nose Other: non pressure wounds to rt and lt labia  Last BM:  9/27  Height:   Ht Readings from Last 1 Encounters:  05/16/19 '5\' 6"'$  (1.676 m)    Weight:   Wt Readings from Last 1 Encounters:  05/16/19 107.5 kg    Ideal Body Weight:  59 kg  BMI:  Body mass index is 38.25 kg/m.  Estimated Nutritional Needs:   Kcal:   2000-2200  Protein:  100-120 grams  Fluid:  > 2 L/day    Corrin Parker, MS, RD, LDN Pager # [phone] After hours/ weekend pager # [phone]

## 2019-06-08 LAB — RENAL FUNCTION PANEL
Albumin: 3.1 g/dL — ABNORMAL LOW (ref 3.5–5.0)
Anion gap: 13 (ref 5–15)
BUN: 10 mg/dL (ref 8–23)
CO2: 21 mmol/L — ABNORMAL LOW (ref 22–32)
Calcium: 9.2 mg/dL (ref 8.9–10.3)
Chloride: 99 mmol/L (ref 98–111)
Creatinine, Ser: 1.12 mg/dL — ABNORMAL HIGH (ref 0.44–1.00)
GFR calc Af Amer: >60 mL/min (ref >60)
GFR calc non Af Amer: 53 mL/min — ABNORMAL LOW (ref >60)
Glucose, Bld: 232 mg/dL — ABNORMAL HIGH (ref 70–99)
Phosphorus: 3.6 mg/dL (ref 2.5–4.6)
Potassium: 4.1 mmol/L (ref 3.5–5.1)
Sodium: 133 mmol/L — ABNORMAL LOW (ref 135–145)

## 2019-06-08 LAB — GLUCOSE, CAPILLARY
Glucose-Capillary: 165 mg/dL — ABNORMAL HIGH (ref 70–99)
Glucose-Capillary: 310 mg/dL — ABNORMAL HIGH (ref 70–99)
Glucose-Capillary: 209 mg/dL — ABNORMAL HIGH (ref 70–99)
Glucose-Capillary: 202 mg/dL — ABNORMAL HIGH (ref 70–99)

## 2019-06-08 NOTE — Progress Notes (Signed)
Neurosurgery Service Progress Note  Subjective: No acute events overnight, no new complaints  Objective: Vitals:   06/07/19 1617 06/07/19 1954 06/07/19 2319 06/08/19 0406  BP: 106/72 (!) 95/54 109/71 122/71  Pulse: 84 84 82 84  Resp: 18     Temp: 98.5 F (36.9 C) 98.9 F (37.2 C) 98.4 F (36.9 C) 98.3 F (36.8 C)  TempSrc: Oral Oral Oral Oral  SpO2: 100% 96% 98% 100%  Weight:      Height:       Temp (24hrs), Avg:98.5 F (36.9 C), Min:98.3 F (36.8 C), Max:98.9 F (37.2 C)  CBC Latest Ref Rng & Units 05/25/2019 05/24/2019 05/23/2019  WBC 4.0 - 10.5 K/uL 5.9 6.4 6.2  Hemoglobin 12.0 - 15.0 g/dL 8.5(L) 7.6(L) 7.8(L)  Hematocrit 36.0 - 46.0 % 28.8(L) 24.5(L) 26.5(L)  Platelets 150 - 400 K/uL 273 252 264   BMP Latest Ref Rng & Units 06/06/2019 06/03/2019 06/01/2019  Glucose 70 - 99 mg/dL 182(H) 270(H) 234(H)  BUN 8 - 23 mg/dL 15 11 9   Creatinine 0.44 - 1.00 mg/dL 1.15(H) 1.17(H) 1.38(H)  Sodium 135 - 145 mmol/L 134(L) 143 141  Potassium 3.5 - 5.1 mmol/L 4.1 4.1 4.7  Chloride 98 - 111 mmol/L 99 111 114(H)  CO2 22 - 32 mmol/L 22 21(L) 18(L)  Calcium 8.9 - 10.3 mg/dL 9.4 9.5 10.0    Intake/Output Summary (Last 24 hours) at 06/08/2019 0802 Last data filed at 06/08/2019 0500 Gross per 24 hour  Intake -  Output 1200 ml  Net -1200 ml    Current Facility-Administered Medications:  .  0.9 %  sodium chloride infusion, , Intravenous, PRN, Judith Part, MD, Stopped at 05/25/19 1657 .  0.9 %  sodium chloride infusion, , Intravenous, Continuous, Asna Muldrow, Joyice Faster, MD, Stopped at 06/01/19 [phone] .  acetaminophen (TYLENOL) tablet 650 mg, 650 mg, Oral, Q4H PRN, 650 mg at 05/24/19 2205 **OR** acetaminophen (TYLENOL) suppository 650 mg, 650 mg, Rectal, Q4H PRN, Judith Part, MD, 650 mg at 05/16/19 0746 .  alum & mag hydroxide-simeth (MAALOX/MYLANTA) 200-200-20 MG/5ML suspension 30 mL, 30 mL, Oral, Q6H PRN, Judith Part, MD, 30 mL at 05/05/19 1523 .  amLODipine (NORVASC)  tablet 10 mg, 10 mg, Oral, Daily, Judith Part, MD, 10 mg at 06/07/19 0957 .  atorvastatin (LIPITOR) tablet 40 mg, 40 mg, Oral, Daily, Judith Part, MD, 40 mg at 06/07/19 1805 .  Chlorhexidine Gluconate Cloth 2 % PADS 6 each, 6 each, Topical, Daily, Judith Part, MD, 6 each at 06/07/19 [phone] .  docusate sodium (COLACE) capsule 100 mg, 100 mg, Oral, BID, Judith Part, MD, 100 mg at 06/07/19 2222 .  feeding supplement (BOOST / RESOURCE BREEZE) liquid 1 Container, 1 Container, Oral, TID BM, Judith Part, MD, 1 Container at 06/07/19 1958 .  feeding supplement (ENSURE ENLIVE) (ENSURE ENLIVE) liquid 237 mL, 237 mL, Oral, TID BM, Shetara Launer A, MD, 237 mL at 06/07/19 1356 .  gabapentin (NEURONTIN) capsule 300 mg, 300 mg, Oral, BID, Judith Part, MD, 300 mg at 06/07/19 2222 .  Gerhardt's butt cream, , Topical, BID, Marcheta Horsey A, MD .  heparin injection 5,000 Units, 5,000 Units, Subcutaneous, Q8H, Judith Part, MD, 5,000 Units at 06/08/19 0517 .  HYDROcodone-acetaminophen (NORCO/VICODIN) 5-325 MG per tablet 1 tablet, 1 tablet, Oral, Q4H PRN, Judith Part, MD, 1 tablet at 06/01/19 2041 .  HYDROmorphone (DILAUDID) injection 0.5 mg, 0.5 mg, Intravenous, Q3H PRN, Judith Part, MD, 0.5 mg  at 04/26/19 0939 .  insulin aspart (novoLOG) injection 0-20 Units, 0-20 Units, Subcutaneous, TID WC, Judith Part, MD, 4 Units at 06/07/19 1806 .  insulin aspart (novoLOG) injection 0-5 Units, 0-5 Units, Subcutaneous, QHS, Judith Part, MD, 2 Units at 06/03/19 2147 .  iron polysaccharides (NIFEREX) capsule 150 mg, 150 mg, Oral, BID, Izaak Sahr A, MD, 150 mg at 06/07/19 2222 .  labetalol (NORMODYNE) injection 10-40 mg, 10-40 mg, Intravenous, Q10 min PRN, Judith Part, MD, 20 mg at 05/20/19 0310 .  lidocaine (PF) (XYLOCAINE) 1 % injection, , , PRN, Jacqulynn Cadet, MD, 10 mL at 04/19/19 1436 .  lip balm (CARMEX) ointment, ,  Topical, PRN, Judith Part, MD .  ondansetron (ZOFRAN) tablet 4 mg, 4 mg, Oral, Q4H PRN, 4 mg at 04/30/19 1043 **OR** ondansetron (ZOFRAN) injection 4 mg, 4 mg, Intravenous, Q4H PRN, Judith Part, MD, 4 mg at 05/16/19 0910 .  pantoprazole (PROTONIX) EC tablet 40 mg, 40 mg, Oral, Daily, Judith Part, MD, 40 mg at 06/07/19 0957 .  polyethylene glycol (MIRALAX / GLYCOLAX) packet 17 g, 17 g, Oral, Daily PRN, Judith Part, MD, 17 g at 06/05/19 0915 .  promethazine (PHENERGAN) injection 12.5-25 mg, 12.5-25 mg, Intravenous, Q4H PRN, Judith Part, MD, 25 mg at 05/15/19 2239 .  promethazine (PHENERGAN) tablet 12.5-25 mg, 12.5-25 mg, Oral, Q4H PRN, Judith Part, MD .  senna (SENOKOT) tablet 8.6 mg, 1 tablet, Oral, BID, Judith Part, MD, 8.6 mg at 06/07/19 2222 .  sodium chloride (OCEAN) 0.65 % nasal spray 4 spray, 4 spray, Each Nare, PRN, Judith Part, MD .  tamsulosin (FLOMAX) capsule 0.4 mg, 0.4 mg, Oral, Daily, Tyquisha Sharps A, MD, 0.4 mg at 06/07/19 0957 .  tiZANidine (ZANAFLEX) tablet 4 mg, 4 mg, Oral, Q8H PRN, Judith Part, MD, 4 mg at 04/20/19 1554   Physical Exam: Awake, Ox3, PERRL, EOMI, FS, Strength 5/5 x4, SILTx4, VFF with mild bitemporal hemianopsia Scalp/relay site incisions healing well, abdominal incisions packed on R, healing well on L and subxiphoid  Assessment & Plan: 63 y.o. 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 Failed bedside lumbar drain placement. 8/11 LD placed under fluoro, minimal output, 8/14 LD removed; 8/16 rhinorrhea, LD replaced, 8/19 somnolence, CTH w/ large volume pneumocephalus, LD clamped, 8/21 s/p repeat endonasal skull base reconstruction, 8/25 rpt CTH with resolving pneumocephalus, febrile started on vanc/cefepime, 9/19  finished course of ABx, 9/22 s/p R VPS placement & LD removal  -on tamsulosin for urinary retention -mental status continues to improve, okay from a neurosurgical perspective for discharge to CIR, insurance pending -SCDs/TEDs, Jackey Loge  06/08/19 8:02 AM

## 2019-06-08 NOTE — Discharge Summary (Signed)
Discharge Summary  Date of Admission: 04/15/2019  Date of Discharge: 06/08/19  Attending Physician: Emelda Brothers, MD  Hospital Course: Patient was admitted after resection of a recurrent pituitary tumor with CSF leak repair. Post-operatively, she began to have rhinorrhea due to CSF leakage, so a lumbar drain was placed. She developed pneumocephalus, so the drain was stopped and she was taken back to the OR for repair of her skull base leak with the use of intrathecal fluorescein. She again had a period without CSF leakage and the drain was removed, but she began to leak again, so it had to be replaced. She had unexplained fevers and CSF cultures did not grow out bacteria, but she had some proteinaceous material in her ventricles on MRI and fevers of unknown origin, so she was treated for a 10d course of antibiotics for presumed ventriculitis. She failed lumbar drain clamp trials and developed symptoms of hydrocephalus, so a VP shunt was placed in the OR and her lumbar drain was discontinued. Post-operatively, her ventricles decreased but remained enlarged from baseline, so her Certas VPS valve was dialed down to performance level 2.0 with good improvement in her mental status. She had some urinary retention, so tamsulosin was started and her foley was able to be discontinued. The patient was discharged to SNF on 06/10/2019. She will follow up in clinic with me in 2 weeks.  Neurologic exam at discharge: AOx3, PERRL, Strength 5/5x4, SILTx4  Discharge diagnosis: Pituitary adenoma  Judith Part, MD 06/08/19 3:18 PM

## 2019-06-08 NOTE — Progress Notes (Signed)
Physical Therapy Treatment Patient Details Name: [name] MRN: [id number] DOB: 1956-06-09 Today's Date: 06/08/2019    History of Present Illness Pt is a 63 y.o. F with significant PMH of DM Type 2, L eye cataract, glaucoma, breast cancer who presented for endoscopic endonasasl redo transphenoidal resection of a recurrent pituitary adenoma with intraoperative CSF leak operatively.  Pt is now s/p VP shunt placement and removal of lumbar drain.    PT Comments    Pt progressing well towards physical therapy goals. Ambulating 25 feet with min assist, still requiring mod assist for transfers to stand. Continues with balance impairments, decreased gait speed, and decreased safety awareness, indicating pt is at increased risk for falls. CIR remains appropriate to address deficits and maximize functional independence.     Follow Up Recommendations  CIR;Supervision/Assistance - 24 hour     Equipment Recommendations  Rolling walker with 5" wheels    Recommendations for Other Services       Precautions / Restrictions Precautions Precautions: Fall Restrictions Weight Bearing Restrictions: No    Mobility  Bed Mobility Overal bed mobility: Needs Assistance Bed Mobility: Supine to Sit     Supine to sit: Min assist     General bed mobility comments: MinA for trunk elevation and scooting left hip out to edge of bed  Transfers Overall transfer level: Needs assistance Equipment used: Rolling walker (2 wheeled) Transfers: Sit to/from Stand Sit to Stand: Mod assist         General transfer comment: Cues for hand placement, modA to power up to stand from edge of bed  Ambulation/Gait Ambulation/Gait assistance: Min assist Gait Distance (Feet): 25 Feet Assistive device: Rolling walker (2 wheeled) Gait Pattern/deviations: Step-through pattern;Decreased stride length Gait velocity: decreased Gait velocity interpretation: <1.8 ft/sec, indicate of risk for recurrent  falls General Gait Details: Noted slow speed, bilateral knee instability, decreased knee flexion on swing through phase, decreased bilateral foot clearance   Stairs             Wheelchair Mobility    Modified Rankin (Stroke Patients Only)       Balance Overall balance assessment: Needs assistance Sitting-balance support: Feet supported;No upper extremity supported Sitting balance-Leahy Scale: Fair     Standing balance support: Bilateral upper extremity supported;During functional activity Standing balance-Leahy Scale: Poor Standing balance comment: reliant on RW and therapist support                            Cognition Arousal/Alertness: Awake/alert Behavior During Therapy: Flat affect Overall Cognitive Status: Impaired/Different from baseline Area of Impairment: Attention;Memory;Following commands;Safety/judgement;Awareness;Problem solving                 Orientation Level: Disoriented to;Time;Situation Current Attention Level: Sustained Memory: Decreased short-term memory Following Commands: Follows one step commands with increased time Safety/Judgement: Decreased awareness of safety;Decreased awareness of deficits Awareness: Intellectual Problem Solving: Slow processing;Difficulty sequencing;Requires verbal cues;Requires tactile cues;Decreased initiation General Comments: Easily distracted, delayed processing with answering questions. States month is October, oriented to year, place with increased time.      Exercises      General Comments        Pertinent Vitals/Pain Pain Assessment: No/denies pain    Home Living                      Prior Function            PT Goals (current goals can now  be found in the care plan section) Acute Rehab PT Goals Patient Stated Goal: not stated Potential to Achieve Goals: Good Progress towards PT goals: Progressing toward goals    Frequency    Min 3X/week      PT Plan Current  plan remains appropriate    Co-evaluation              AM-PAC PT "6 Clicks" Mobility   Outcome Measure  Help needed turning from your back to your side while in a flat bed without using bedrails?: A Little Help needed moving from lying on your back to sitting on the side of a flat bed without using bedrails?: A Little Help needed moving to and from a bed to a chair (including a wheelchair)?: A Little Help needed standing up from a chair using your arms (e.g., wheelchair or bedside chair)?: A Lot Help needed to walk in hospital room?: A Little Help needed climbing 3-5 steps with a railing? : Total 6 Click Score: 15    End of Session Equipment Utilized During Treatment: Gait belt Activity Tolerance: Patient tolerated treatment well Patient left: in chair;with call bell/phone within reach;with chair alarm set Nurse Communication: Mobility status PT Visit Diagnosis: Muscle weakness (generalized) (M62.81);Difficulty in walking, not elsewhere classified (R26.2)     Time: [id number] PT Time Calculation (min) (ACUTE ONLY): 15 min  Charges:  $Therapeutic Activity: 8-22 mins                     Ellamae Sia, PT, DPT Acute Rehabilitation Services Pager [phone] Office [phone]    Willy Eddy 06/08/2019, 10:16 AM

## 2019-06-08 NOTE — Progress Notes (Signed)
Inpatient Rehabilitation-Admissions Coordinator   Pt's insurance has approved IP Rehab. However, based on her current insurance policy, the patient has a 60 day limit for hospital inpatient/IP Rehab stay combined. Thus, after 60 days of inpatient services (hospital and/or CIR) the patient would not be covered. At this time, from chart review it appears the patient is on day 59 vs 60 for this year. Due to out of pocket cost, IP Rehab is not an option per family.I have contacted CM to have a discussion with her family about other options for rehab which may be covered by insurance.   As family has stated they are unable to pay for CIR, I will have to sign off at this time.   Please call if questions.   Jhonnie Garner, OTR/L  Rehab Admissions Coordinator  [phone] 06/08/2019 12:22 PM

## 2019-06-09 LAB — GLUCOSE, CAPILLARY
Glucose-Capillary: 180 mg/dL — ABNORMAL HIGH (ref 70–99)
Glucose-Capillary: 208 mg/dL — ABNORMAL HIGH (ref 70–99)
Glucose-Capillary: 257 mg/dL — ABNORMAL HIGH (ref 70–99)
Glucose-Capillary: 165 mg/dL — ABNORMAL HIGH (ref 70–99)

## 2019-06-09 NOTE — TOC Progression Note (Signed)
Transition of Care [facility]) - Progression Note    Patient Details  Name: SALMAH BIRKETT MRN: [id number] Date of Birth: [birth year]  [facility] CM/SW [place], [place] Phone Number: 06/09/2019, 11:53 AM  Clinical Narrative:     CSW called patient's daughter,Kyndra- voice mail was full, unable to leave voice message-   Called patient's sister, Fonnie Jarvis- left voice message to return call.  Thurmond Butts, MSW, [facility] Clinical Social Worker [phone]    Expected Discharge Plan: IP Rehab Facility Barriers to Discharge: Continued Medical Work up  Expected Discharge Plan and Services Expected Discharge Plan: [place]     Post Acute Care Choice: IP Rehab Living arrangements for the past 2 months: Single Family Home Expected Discharge Date: 04/22/19                                     Social Determinants of Health (SDOH) Interventions    Readmission Risk Interventions No flowsheet data found.

## 2019-06-09 NOTE — Progress Notes (Signed)
Occupational Therapy Treatment Patient Details Name: NIKOLETA STAVROPOULOS MRN: [id number] DOB: 08-Jun-1956 Today's Date: 06/09/2019    History of present illness Pt is a 63 y.o. F with significant PMH of DM Type 2, L eye cataract, glaucoma, breast cancer who presented for endoscopic endonasasl redo transphenoidal resection of a recurrent pituitary adenoma with intraoperative CSF leak operatively.  Pt is now s/p VP shunt placement and removal of lumbar drain.   OT comments   Session focus on functional mobility and standing ADLs at sink. Pt min guard functional mobility from recliner>sink with RW. Pt requires cues for hand placement on RW and safety overall. Pt complete standing grooming at sink ~ 4 minutes with min guard- supervision for safety. Cues for sequencing of task/safety during stand ADLs as pt asking if  toothpaste is Vaseline. Pt ambulated back to EOB with close min guard;pt reports feeling dizzy at EOB. BP checked- 122/81. Assisted pt to supine with MIN A.  Will continue to follow acutely for OT needs.    Follow Up Recommendations  CIR;Supervision/Assistance - 24 hour    Equipment Recommendations  Other (comment)(tbd to next venue)    Recommendations for Other Services      Precautions / Restrictions Precautions Precautions: Fall Restrictions Weight Bearing Restrictions: No       Mobility Bed Mobility Overal bed mobility: Needs Assistance Bed Mobility: Sit to Supine       Sit to supine: Min assist   General bed mobility comments: MIN A to initiate sit>supine as pt sitting EOB reporting dizziness; BP checked 122/81  Transfers Overall transfer level: Needs assistance Equipment used: Rolling walker (2 wheeled) Transfers: Sit to/from Stand Sit to Stand: Min guard         General transfer comment: Cues for hand placement, MIN guard for safety with inital stand    Balance Overall balance assessment: Needs assistance Sitting-balance support: Feet supported;No upper  extremity supported Sitting balance-Leahy Scale: Fair Sitting balance - Comments: able to sit EOB with S   Standing balance support: During functional activity Standing balance-Leahy Scale: Fair Standing balance comment: able to stand at sink ~ 4 minutes during functional activity with no LOB with intermittent single extremity support on sink                           ADL either performed or assessed with clinical judgement   ADL       Grooming: Min guard;Cueing for sequencing;Wash/dry face;Oral care;Standing;Cueing for safety Grooming Details (indicate cue type and reason): Pt min guard for standing grooming at sink with RW; pt requires MIN cues for safety/ sequencing of task as pt asking if her toothpaste with vaseline                 Toilet Transfer: Ambulation;RW;Min guard;Cueing for safety;Cueing for sequencing Toilet Transfer Details (indicate cue type and reason): simulated from recliner> EOB; Min guard for safety; cues for hand placement on RW; cues for sequencing of task as pt trying to walk away w/o RW         Functional mobility during ADLs: Rolling walker;Min guard General ADL Comments: MIN A- MIN guard for ADLs; cues for safety during functional mobility; cues for sequencing of task for standing ADLs     Vision Baseline Vision/History: Wears glasses Wears Glasses: Reading only Patient Visual Report: No change from baseline;Other (comment) Vision Assessment?: No apparent visual deficits   Perception     Praxis  Cognition Arousal/Alertness: Awake/alert Behavior During Therapy: Flat affect Overall Cognitive Status: Impaired/Different from baseline Area of Impairment: Attention;Memory;Following commands;Safety/judgement;Awareness;Problem solving                 Orientation Level: Disoriented to;Time;Situation;Place(able to recall [facility] hospital at end of session/ when asked DOB replied, "[place], [place]") Current Attention Level:  Sustained Memory: Decreased short-term memory Following Commands: Follows one step commands with increased time Safety/Judgement: Decreased awareness of safety;Decreased awareness of deficits Awareness: Intellectual Problem Solving: Slow processing;Difficulty sequencing;Requires verbal cues;Requires tactile cues;Decreased initiation General Comments: Easily distracted, delayed processing with answering questions. States month is November, oriented to year, place with increased time. Unable to read time off of clock        Exercises     Shoulder Instructions       General Comments reports dizziness when standing and at EOB at end of session; BP checked EOB 122/81- RN aware    Pertinent Vitals/ Pain       Pain Assessment: No/denies pain  Home Living                                          Prior Functioning/Environment              Frequency  Min 2X/week        Progress Toward Goals  OT Goals(current goals can now be found in the care plan section)  Progress towards OT goals: Progressing toward goals  Acute Rehab OT Goals Patient Stated Goal: not stated OT Goal Formulation: With patient Time For Goal Achievement: 06/16/19 Potential to Achieve Goals: Good  Plan Discharge plan needs to be updated(pt not candidate for CIR d/t insurance authorization)    Co-evaluation                 AM-PAC OT "6 Clicks" Daily Activity     Outcome Measure   Help from another person eating meals?: A Little Help from another person taking care of personal grooming?: A Little Help from another person toileting, which includes using toliet, bedpan, or urinal?: A Little Help from another person bathing (including washing, rinsing, drying)?: A Little Help from another person to put on and taking off regular upper body clothing?: A Little Help from another person to put on and taking off regular lower body clothing?: A Little 6 Click Score: 18    End of  Session Equipment Utilized During Treatment: Rolling walker;Gait belt  OT Visit Diagnosis: Unsteadiness on feet (R26.81);Other abnormalities of gait and mobility (R26.89);Other symptoms and signs involving the nervous system (R29.898);Other symptoms and signs involving cognitive function   Activity Tolerance Patient tolerated treatment well   Patient Left in bed;with call bell/phone within reach;with bed alarm set   Nurse Communication Mobility status;Other (comment)(cognition deficits)        Time: [id number] OT Time Calculation (min): 22 min  Charges: OT General Charges $OT Visit: 1 Visit OT Treatments $Self Care/Home Management : 8-22 mins  [place], [place] Acute Rehabilitation Services [phone] [place] 06/09/2019, 3:10 PM

## 2019-06-09 NOTE — Progress Notes (Signed)
Neurosurgery Service Progress Note  Subjective: No acute events overnight, no new complaints  Objective: Vitals:   06/08/19 1620 06/08/19 1955 06/09/19 0002 06/09/19 0352  BP: 97/67 108/63 114/73 98/78  Pulse: 85 85 84 91  Resp: 16     Temp: 98.9 F (37.2 C) 98.6 F (37 C) 98.1 F (36.7 C) 98.6 F (37 C)  TempSrc: Oral Oral Oral Oral  SpO2: 100% 92% 100% 100%  Weight:      Height:       Temp (24hrs), Avg:98.5 F (36.9 C), Min:98.1 F (36.7 C), Max:98.9 F (37.2 C)  CBC Latest Ref Rng & Units 05/25/2019 05/24/2019 05/23/2019  WBC 4.0 - 10.5 K/uL 5.9 6.4 6.2  Hemoglobin 12.0 - 15.0 g/dL 8.5(L) 7.6(L) 7.8(L)  Hematocrit 36.0 - 46.0 % 28.8(L) 24.5(L) 26.5(L)  Platelets 150 - 400 K/uL 273 252 264   BMP Latest Ref Rng & Units 06/08/2019 06/06/2019 06/03/2019  Glucose 70 - 99 mg/dL 232(H) 182(H) 270(H)  BUN 8 - 23 mg/dL 10 15 11   Creatinine 0.44 - 1.00 mg/dL 1.12(H) 1.15(H) 1.17(H)  Sodium 135 - 145 mmol/L 133(L) 134(L) 143  Potassium 3.5 - 5.1 mmol/L 4.1 4.1 4.1  Chloride 98 - 111 mmol/L 99 99 111  CO2 22 - 32 mmol/L 21(L) 22 21(L)  Calcium 8.9 - 10.3 mg/dL 9.2 9.4 9.5    Intake/Output Summary (Last 24 hours) at 06/09/2019 0815 Last data filed at 06/09/2019 0500 Gross per 24 hour  Intake -  Output 400 ml  Net -400 ml    Current Facility-Administered Medications:  .  0.9 %  sodium chloride infusion, , Intravenous, PRN, Judith Part, MD, Stopped at 05/25/19 1657 .  0.9 %  sodium chloride infusion, , Intravenous, Continuous, Marabeth Melland, Joyice Faster, MD, Stopped at 06/01/19 [phone] .  acetaminophen (TYLENOL) tablet 650 mg, 650 mg, Oral, Q4H PRN, 650 mg at 05/24/19 2205 **OR** acetaminophen (TYLENOL) suppository 650 mg, 650 mg, Rectal, Q4H PRN, Judith Part, MD, 650 mg at 05/16/19 0746 .  alum & mag hydroxide-simeth (MAALOX/MYLANTA) 200-200-20 MG/5ML suspension 30 mL, 30 mL, Oral, Q6H PRN, Judith Part, MD, 30 mL at 05/05/19 1523 .  amLODipine (NORVASC) tablet 10  mg, 10 mg, Oral, Daily, Shalane Florendo, Joyice Faster, MD, 10 mg at 06/08/19 0940 .  atorvastatin (LIPITOR) tablet 40 mg, 40 mg, Oral, Daily, Judith Part, MD, 40 mg at 06/08/19 1829 .  docusate sodium (COLACE) capsule 100 mg, 100 mg, Oral, BID, Judith Part, MD, 100 mg at 06/08/19 2203 .  feeding supplement (BOOST / RESOURCE BREEZE) liquid 1 Container, 1 Container, Oral, TID BM, Judith Part, MD, 1 Container at 06/08/19 2026 .  feeding supplement (ENSURE ENLIVE) (ENSURE ENLIVE) liquid 237 mL, 237 mL, Oral, TID BM, Roger Fasnacht A, MD, 237 mL at 06/08/19 1400 .  gabapentin (NEURONTIN) capsule 300 mg, 300 mg, Oral, BID, Judith Part, MD, 300 mg at 06/08/19 2203 .  Gerhardt's butt cream, , Topical, BID, Lorane Cousar A, MD .  heparin injection 5,000 Units, 5,000 Units, Subcutaneous, Q8H, Blake Goya, Joyice Faster, MD, 5,000 Units at 06/09/19 0505 .  HYDROcodone-acetaminophen (NORCO/VICODIN) 5-325 MG per tablet 1 tablet, 1 tablet, Oral, Q4H PRN, Judith Part, MD, 1 tablet at 06/01/19 2041 .  HYDROmorphone (DILAUDID) injection 0.5 mg, 0.5 mg, Intravenous, Q3H PRN, Judith Part, MD, 0.5 mg at 04/26/19 0939 .  insulin aspart (novoLOG) injection 0-20 Units, 0-20 Units, Subcutaneous, TID WC, Kavontae Pritchard, Joyice Faster, MD, 7 Units at 06/08/19  1730 .  insulin aspart (novoLOG) injection 0-5 Units, 0-5 Units, Subcutaneous, QHS, Judith Part, MD, 2 Units at 06/08/19 2205 .  iron polysaccharides (NIFEREX) capsule 150 mg, 150 mg, Oral, BID, Judith Part, MD, 150 mg at 06/08/19 2204 .  labetalol (NORMODYNE) injection 10-40 mg, 10-40 mg, Intravenous, Q10 min PRN, Judith Part, MD, 20 mg at 05/20/19 0310 .  lidocaine (PF) (XYLOCAINE) 1 % injection, , , PRN, Jacqulynn Cadet, MD, 10 mL at 04/19/19 1436 .  lip balm (CARMEX) ointment, , Topical, PRN, Judith Part, MD .  ondansetron (ZOFRAN) tablet 4 mg, 4 mg, Oral, Q4H PRN, 4 mg at 04/30/19 1043 **OR** ondansetron  (ZOFRAN) injection 4 mg, 4 mg, Intravenous, Q4H PRN, Judith Part, MD, 4 mg at 05/16/19 0910 .  pantoprazole (PROTONIX) EC tablet 40 mg, 40 mg, Oral, Daily, Judith Part, MD, 40 mg at 06/08/19 0940 .  polyethylene glycol (MIRALAX / GLYCOLAX) packet 17 g, 17 g, Oral, Daily PRN, Judith Part, MD, 17 g at 06/05/19 0915 .  promethazine (PHENERGAN) injection 12.5-25 mg, 12.5-25 mg, Intravenous, Q4H PRN, Judith Part, MD, 25 mg at 05/15/19 2239 .  promethazine (PHENERGAN) tablet 12.5-25 mg, 12.5-25 mg, Oral, Q4H PRN, Judith Part, MD .  senna (SENOKOT) tablet 8.6 mg, 1 tablet, Oral, BID, Judith Part, MD, 8.6 mg at 06/08/19 2204 .  sodium chloride (OCEAN) 0.65 % nasal spray 4 spray, 4 spray, Each Nare, PRN, Judith Part, MD .  tamsulosin (FLOMAX) capsule 0.4 mg, 0.4 mg, Oral, Daily, Caliph Borowiak A, MD, 0.4 mg at 06/08/19 0940 .  tiZANidine (ZANAFLEX) tablet 4 mg, 4 mg, Oral, Q8H PRN, Judith Part, MD, 4 mg at 04/20/19 1554   Physical Exam: Awake, Ox3, PERRL, EOMI, FS, Strength 5/5 x4, SILTx4, VFF with mild bitemporal hemianopsia Scalp/relay site incisions healing well, abdominal incisions packed on R, healing well on L and subxiphoid  Assessment & Plan: 63 y.o. 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 Failed bedside lumbar drain placement. 8/11 LD placed under fluoro, minimal output, 8/14 LD removed; 8/16 rhinorrhea, LD replaced, 8/19 somnolence, CTH w/ large volume pneumocephalus, LD clamped, 8/21 s/p repeat endonasal skull base reconstruction, 8/25 rpt CTH with resolving pneumocephalus, febrile started on vanc/cefepime, 9/19 finished course of ABx, 9/22 s/p R VPS placement & LD removal  -on tamsulosin for urinary retention, can d/c now -insurance won't pay for CIR,  will unfortunately likely have to arrange SNF w/ PT services, will c/s CSW -SCDs/TEDs, SQH  Judith Part  06/09/19 8:15 AM

## 2019-06-09 NOTE — TOC Progression Note (Signed)
Transition of Care [facility]) - Progression Note    Patient Details  Name: [name] MRN: [id number] Date of Birth: 1955/11/16  Transition of Care [facility]) CM/SW [place], [place] Phone Number: 06/09/2019, 3:22 PM  Clinical Narrative:     CSW spoke with the patient's sister,Elaine. CSW introduced self and explained role.Patient sister inquired how I got involved.  CSW informed once a patient is unable to go to inpatient rehab, CSW is consulted for possible SNF placement. Pt's sister states the family has made it very clear they did not want SNF. She inquired what were the other choice, CSW explained SNF or home. Pt's sister states the family needs more time to decide and expressed concerns they feel  rushed to make decision. Pt's sister has NOT agreed to SNF  but was willing to explore options. CSW provided family with Medicare.gov website to review SNF's  star ratings.  Patient sister not agreeable to SNF at this time. She states she is continuing to "fight" with insurance.    CSW will continue to follow and assist with discharge planning.  Thurmond Butts, MSW, [facility] Clinical Social Worker [phone]      Expected Discharge Plan: IP Rehab Facility Barriers to Discharge: Continued Medical Work up  Expected Discharge Plan and Services Expected Discharge Plan: [place]     Post Acute Care Choice: IP Rehab Living arrangements for the past 2 months: Single Family Home Expected Discharge Date: 04/22/19                                     Social Determinants of Health (SDOH) Interventions    Readmission Risk Interventions No flowsheet data found.

## 2019-06-09 NOTE — Progress Notes (Signed)
Physical Therapy Treatment Patient Details Name: [name] MRN: [id number] DOB: Nov 24, 1955 Today's Date: 06/09/2019    History of Present Illness Pt is a 62 y.o. F with significant PMH of DM Type 2, L eye cataract, glaucoma, breast cancer who presented for endoscopic endonasasl redo transphenoidal resection of a recurrent pituitary adenoma with intraoperative CSF leak operatively.  Pt is now s/p VP shunt placement and removal of lumbar drain.    PT Comments    Pt progressing slowly towards physical therapy goals. Had urinary incontinence upon standing, assisted to bathroom for peri care. Ambulating 15 feet with walker and min guard assist, requires up to mod assist for transfer to standing. Continues with decreased cognition including decreased orientation, awareness, attention, memory. Also displays balance impairments and decreased endurance. Continue to recommend comprehensive inpatient rehab (CIR) for post-acute therapy needs.    Follow Up Recommendations  CIR;Supervision/Assistance - 24 hour     Equipment Recommendations  Rolling walker with 5" wheels    Recommendations for Other Services       Precautions / Restrictions Precautions Precautions: Fall Restrictions Weight Bearing Restrictions: No    Mobility  Bed Mobility Overal bed mobility: Needs Assistance Bed Mobility: Sit to Supine     Supine to sit: Min guard Sit to supine: Min assist   General bed mobility comments: Increased time  Transfers Overall transfer level: Needs assistance Equipment used: Rolling walker (2 wheeled) Transfers: Sit to/from Stand Sit to Stand: Min assist;Mod assist         General transfer comment: MinA to rise from edge of bed, modA from toilet  Ambulation/Gait Ambulation/Gait assistance: Min guard Gait Distance (Feet): 15 Feet Assistive device: Rolling walker (2 wheeled) Gait Pattern/deviations: Step-through pattern;Decreased stride length Gait velocity: decreased    General Gait Details: Slow speed, bilateral knee instability, increased lateral sway and trunk flexion   Stairs             Wheelchair Mobility    Modified Rankin (Stroke Patients Only)       Balance Overall balance assessment: Needs assistance Sitting-balance support: Feet supported;No upper extremity supported Sitting balance-Leahy Scale: Fair Sitting balance - Comments: able to sit EOB with S   Standing balance support: During functional activity Standing balance-Leahy Scale: Poor Standing balance comment: posterior LOB with washing at sink                            Cognition Arousal/Alertness: Awake/alert Behavior During Therapy: Flat affect Overall Cognitive Status: Impaired/Different from baseline Area of Impairment: Attention;Memory;Following commands;Safety/judgement;Awareness;Problem solving                 Orientation Level: Disoriented to;Time;Situation;Place Current Attention Level: Sustained Memory: Decreased short-term memory Following Commands: Follows one step commands with increased time Safety/Judgement: Decreased awareness of safety;Decreased awareness of deficits Awareness: Intellectual Problem Solving: Slow processing;Difficulty sequencing;Requires verbal cues;Requires tactile cues;Decreased initiation General Comments: When asked what day of the week it is, pt responds with "1, no 11." Decreased short term memory recall about day's events, decreased attention span. States her daughter is outside the room (which she was not).      Exercises      General Comments General comments (skin integrity, edema, etc.): reports dizziness when standing and at EOB at end of session; BP checked EOB 122/81- RN aware      Pertinent Vitals/Pain Pain Assessment: Faces Faces Pain Scale: Hurts a little bit Pain Location: left knee Pain Descriptors /  Indicators: Sore Pain Intervention(s): Monitored during session    Home Living                       Prior Function            PT Goals (current goals can now be found in the care plan section) Acute Rehab PT Goals Patient Stated Goal: not stated Potential to Achieve Goals: Good Progress towards PT goals: Progressing toward goals    Frequency    Min 3X/week      PT Plan Current plan remains appropriate    Co-evaluation              AM-PAC PT "6 Clicks" Mobility   Outcome Measure  Help needed turning from your back to your side while in a flat bed without using bedrails?: None Help needed moving from lying on your back to sitting on the side of a flat bed without using bedrails?: A Little Help needed moving to and from a bed to a chair (including a wheelchair)?: A Little Help needed standing up from a chair using your arms (e.g., wheelchair or bedside chair)?: A Lot Help needed to walk in hospital room?: A Little Help needed climbing 3-5 steps with a railing? : A Lot 6 Click Score: 17    End of Session   Activity Tolerance: Patient tolerated treatment well Patient left: in chair;with call bell/phone within reach;with chair alarm set   PT Visit Diagnosis: Muscle weakness (generalized) (M62.81);Difficulty in walking, not elsewhere classified (R26.2)     Time: [id number] PT Time Calculation (min) (ACUTE ONLY): 24 min  Charges:  $Therapeutic Activity: 23-37 mins                     Ellamae Sia, [place], DPT Acute Rehabilitation Services Pager [phone] Office [phone]    Willy Eddy 06/09/2019, 5:10 PM

## 2019-06-10 LAB — RENAL FUNCTION PANEL
Albumin: 3.2 g/dL — ABNORMAL LOW (ref 3.5–5.0)
Anion gap: 11 (ref 5–15)
BUN: 11 mg/dL (ref 8–23)
CO2: 22 mmol/L (ref 22–32)
Calcium: 9.3 mg/dL (ref 8.9–10.3)
Chloride: 102 mmol/L (ref 98–111)
Creatinine, Ser: 1.08 mg/dL — ABNORMAL HIGH (ref 0.44–1.00)
GFR calc Af Amer: >60 mL/min (ref >60)
GFR calc non Af Amer: 55 mL/min — ABNORMAL LOW (ref >60)
Glucose, Bld: 200 mg/dL — ABNORMAL HIGH (ref 70–99)
Phosphorus: 4 mg/dL (ref 2.5–4.6)
Potassium: 3.8 mmol/L (ref 3.5–5.1)
Sodium: 135 mmol/L (ref 135–145)

## 2019-06-10 LAB — GLUCOSE, CAPILLARY
Glucose-Capillary: 167 mg/dL — ABNORMAL HIGH (ref 70–99)
Glucose-Capillary: 288 mg/dL — ABNORMAL HIGH (ref 70–99)
Glucose-Capillary: 246 mg/dL — ABNORMAL HIGH (ref 70–99)
Glucose-Capillary: 180 mg/dL — ABNORMAL HIGH (ref 70–99)

## 2019-06-10 NOTE — H&P (Signed)
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 She was admitted on 04/15/19 for repeat endoscopic transphenoidal resection of tumor by Drs. Ostegard and [employer]. Post op course complicate by fevers with confusion, cognitive deficits and visual changes. She also had nasal drainage with activity concerning for leak therefore taken to OR for placement of lumbar drain on 04/24/19.  Follow up CT head on 08/19 showed large pneumocephalus with mass-effect on right frontal lobe and diffuse cerebral sulcal effacement as well as posterior displacement of brainstem.  This was thought to be secondary to drain therefore drain was clamped and she was taken back to the OR on 8/21 for repair of CSF leak with abdominal wound revision.  On 8/26, she developed recurrent fever with tachycardia and was started on empiric antibiotics.  She had recurrent nasal drainage with clear attempts at clamping lumbar drain therefore was placed on bedrest.  On 09/06, she developed hypoxia with fever and tachycardia and declining mental status.  CSF smear showed 830 WBCs with protein elevated at 448.  ID consulted for input and she was started on Vanco and cefepime for bacterial meningitis.  She continued to have fevers and antibiotics caused brought in.  CSF cultures and blood cultures were negative and INT recommended ENT evaluation as well as shunt revision.  Dr. Venetia Constable felt that changes were due to ventriculitis and recommended monitoring.  She developed lethargy with attempts at lumbar drain clamping..  She continued to have decline in mental status with CT of head showing concerns of hydrocephalus.  Antibiotics was discontinued  on zero 9/18 as she had been afebrile x24 hours.  She was taken to the OR on 922 for removal of lumbar drain and placement of VP shunt.  She tolerated extubation without difficulty and mentation has been stable.  No recurrent fevers noted.  Follow-up CT head showed continued enlargement of ventricles therefore shunt was dialed down to 2.0 on 09/25.  Therapy resumed and patient is making progress with therapy.  She continues have balance deficits with shuffling, requires cues for sequencing with task as well as some dizziness with activity.  CIR recommended due to functional decline.  Pt reports she's ready to come to rehab- Notes she's been in the hospital "for a couple of weeks for surgery".  Admits to blurry vision B/L but L much worse than R- denies double vision.   Review of Systems  Constitutional: Negative for chills and fever.  HENT: Negative for hearing loss and tinnitus.   Eyes: Positive for blurred vision (decreased vision in right and loss of vision in left now).  Respiratory: Negative for cough and shortness of breath.   Cardiovascular: Negative for chest pain and palpitations.  Gastrointestinal: Negative for constipation, heartburn and nausea.  Genitourinary: Negative for dysuria and urgency.  Musculoskeletal: Negative for myalgias and neck pain.  Skin: Negative for rash.  Neurological: Negative for dizziness, focal weakness and headaches.  Psychiatric/Behavioral: Positive for memory loss. Negative for depression. The patient is not nervous/anxious.     Past Medical History:  Diagnosis Date  . Anemia   . Breast cancer ([place]) 12/29/13   right 6:00 o'clock, lower outer  . Cataract of left eye   . Diabetes  mellitus    type 2  . Family history of malignant neoplasm of breast   . Glaucoma    MD just watching, no eye drops  . High cholesterol   . History of blood transfusion 2015   with chemo treatments  . History of radiation therapy 08/12/14- 10/11/14   right breast /50.4 Gy/28  fx, right breast boost/ 10 Gy/ 5 fx  . HTN (hypertension)   . Personal history of chemotherapy   . Personal history of radiation therapy   . Pituitary adenoma ([place]) 10/30/2018  . Wears dentures    full top-partial bottom  . Wears dentures    full  . Wears glasses   . Wears glasses     Past Surgical History:  Procedure Laterality Date  . ABDOMINAL HYSTERECTOMY  1993   endometriosis  . BREAST BIOPSY Left 12/29/2013  . BREAST LUMPECTOMY Right 02/08/2014  . BREAST LUMPECTOMY WITH AXILLARY LYMPH NODE BIOPSY Right 02/08/14   invasive ductal  . CRANIOTOMY N/A 04/15/2019   Procedure: Transphenoid resection of pituitary tumor with fat graft;  Surgeon: Judith Part, MD;  Location: [facility];  Service: Neurosurgery;  Laterality: N/A;  . IR IMAGE GUIDED FLUID DRAIN BY CATHETER  04/19/2019  . MULTIPLE TOOTH EXTRACTIONS    . PLACEMENT OF LUMBAR DRAIN N/A 04/24/2019   Procedure: PLACEMENT OF LUMBAR DRAIN;  Surgeon: Eustace Moore, MD;  Location: [facility];  Service: Neurosurgery;  Laterality: N/A;  . PLACEMENT OF LUMBAR DRAIN N/A 05/16/2019   Procedure: PLACEMENT OF LUMBAR DRAIN;  Surgeon: Newman Pies, MD;  Location: [facility];  Service: Neurosurgery;  Laterality: N/A;  PLACEMENT OF LUMBAR DRAIN  . PORT-A-CATH REMOVAL    . PORTACATH PLACEMENT Left 02/08/2014   Procedure: INSERTION PORT-A-CATH;  Surgeon: Rolm Bookbinder, MD;  Location: [facility];  Service: General;  Laterality: Left;  . REPAIR OF CEREBROSPINAL FLUID LEAK N/A 04/29/2019   Procedure: Endoscopic Endonasal Cerebrospinal Fluid Leak repair, Abdominal wound revision and fat graft harvest, Intrathecal Fluorocein Injection;  Surgeon: Judith Part, MD;  Location: [facility];  Service: Neurosurgery;  Laterality: N/A;  . TONSILLECTOMY    . TRANSNASAL APPROACH N/A 04/15/2019   Procedure: TRANSNASAL APPROACH;  Surgeon: Jerrell Belfast, MD;  Location: [facility];  Service: ENT;  Laterality: N/A;  . TRANSPHENOIDAL APPROACH EXPOSURE N/A  04/15/2019   Procedure: ENDOSCOPIC TRANS NASAL APPROACH WITH FUSIONN;  Surgeon: Jerrell Belfast, MD;  Location: [facility];  Service: ENT;  Laterality: N/A;  . TRANSPHENOIDAL APPROACH EXPOSURE N/A 04/29/2019   Procedure: Endoscopic repair of spinal fluid leak ;  Surgeon: Jerrell Belfast, MD;  Location: [facility];  Service: ENT;  Laterality: N/A;  . TRANSPHENOIDAL PITUITARY RESECTION  2006  . VENTRICULOPERITONEAL SHUNT Right 05/31/2019   Procedure: SHUNT INSERTION VENTRICULAR-PERITONEAL; REMOVAL OF LUMBAR DRAIN;  Surgeon: Judith Part, MD;  Location: [facility];  Service: Neurosurgery;  Laterality: Right;  SHUNT INSERTION VENTRICULAR-PERITONEAL; REMOVAL OF LUMBAR DRAIN  . WISDOM TOOTH EXTRACTION      Family History  Problem Relation Age of Onset  . Hypertension Mother   . Diabetes type II Mother   . Breast cancer Mother        dx ~2; deceased 6  . Hypertension Father   . Breast cancer Maternal Aunt        deceased 64  . Cancer Maternal Uncle        unk. type; deceased 41s  . Cancer Maternal Uncle        unk. type;  deceased late 25s  . Uterine cancer Cousin 81       daughter of an unaffected mat aunt who is 27    Social History:  Lives alone. Widowed?  Has a daughter in [place]? She eports that she quit smoking about 25 years ago. Her smoking use included cigarettes. She has never used smokeless tobacco. She drinks a glass of wine couple of times a month.  She reports that she does not use drugs.  She said her daughter cannot help her when she goes home, but has multiple neighbors, her dad who's retired and some "brothers and sisters" who could help out. Lives alone.    Allergies: No Known Allergies    Medications Prior to Admission  Medication Sig Dispense Refill  . amLODipine (NORVASC) 10 MG tablet Take 10 mg by mouth daily.     Marland Kitchen atorvastatin (LIPITOR) 40 MG tablet Take 40 mg by mouth daily.    . diclofenac (VOLTAREN) 75 MG EC tablet Take 75 mg by mouth 2 (two) times daily.    Marland Kitchen  FERREX 150 150 MG capsule Take 150 mg by mouth 2 (two) times daily.    Marland Kitchen gabapentin (NEURONTIN) 300 MG capsule TAKE 1 CAPSULE BY MOUTH AT BEDTIME (Patient taking differently: Take 300 mg by mouth 3 (three) times daily. ) 30 capsule 4  . glucose monitoring kit (FREESTYLE) monitoring kit One Touch Ultra machine and strips, patient tests her blood sugars bid    . glyBURIDE (DIABETA) 5 MG tablet Take 5 mg by mouth 2 (two) times daily with a meal.     . lisinopril-hydrochlorothiazide (ZESTORETIC) 20-12.5 MG tablet Take 2 tablets by mouth daily.    . metFORMIN (GLUCOPHAGE-XR) 500 MG 24 hr tablet Take 1,000 mg by mouth daily with breakfast.     . ONE TOUCH ULTRA TEST test strip     . tiZANidine (ZANAFLEX) 4 MG tablet Take 4 mg by mouth every 8 (eight) hours as needed for muscle spasms.    . Vitamin D, Ergocalciferol, (DRISDOL) 1.25 MG (50000 UT) CAPS capsule Take 50,000 Units by mouth every Sunday.       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  Totally independent.     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  Functional Status:  Mobility: Bed Mobility Overal bed mobility: Needs Assistance Bed Mobility: Sit to Supine Rolling: Mod assist Sidelying to sit: Mod assist Supine to sit: Min guard Sit to supine: Min assist General bed mobility comments: Increased time Transfers Overall transfer level: Needs assistance Equipment used: Rolling walker (2 wheeled) Transfers: Sit  to/from Stand Sit to Stand: Min assist, Mod assist Stand pivot transfers: Mod assist General transfer comment: MinA to rise from edge of bed, modA from toilet Ambulation/Gait Ambulation/Gait assistance: Min guard Gait Distance (Feet): 15 Feet Assistive device: Rolling walker (2 wheeled) Gait Pattern/deviations: Step-through pattern, Decreased stride length General Gait Details: Slow speed, bilateral knee instability, increased lateral sway and trunk flexion Gait velocity: decreased Gait velocity interpretation: <1.8 ft/sec, indicate of risk for recurrent falls    ADL: ADL Overall ADL's :  Needs assistance/impaired Eating/Feeding: Minimal assistance, Cueing for sequencing, Sitting Eating/Feeding Details (indicate cue type and reason): Pt required cues to cut food before eating and to open ice cream before attempting to eat it.   Grooming: Min guard, Cueing for sequencing, Wash/dry face, Oral care, Standing, Cueing for safety Grooming Details (indicate cue type and reason): Pt min guard for standing grooming at sink with RW; pt requires MIN cues for safety/ sequencing of task as pt asking if her toothpaste with vaseline Upper Body Bathing: Moderate assistance, Sitting, Cueing for sequencing Lower Body Bathing: Maximal assistance, Sit to/from stand, Cueing for compensatory techniques, Cueing for sequencing Lower Body Bathing Details (indicate cue type and reason): Pt fatigues quickly. Requires cues to continue the task and stay focused.   Upper Body Dressing : Moderate assistance, Sitting Lower Body Dressing: Maximal assistance, Sit to/from stand, Bed level Lower Body Dressing Details (indicate cue type and reason): RN provided max A to help pt don briefs in bed above knees. Pt moved EOB and performed sit<> stand with mod A to power up and maintain balance. Pt pulled up briefs over hips, required A to pull all the way up Toilet Transfer: Ambulation, RW, Min guard, Cueing for safety, Cueing for  sequencing Toilet Transfer Details (indicate cue type and reason): simulated from recliner> EOB; Min guard for safety; cues for hand placement on RW; cues for sequencing of task as pt trying to walk away w/o RW Toileting- Clothing Manipulation and Hygiene: Total assistance, Sit to/from stand Toileting - Clothing Manipulation Details (indicate cue type and reason): Therapist assists pt with standing while second person manages clothing. Functional mobility during ADLs: Rolling walker, Min guard General ADL Comments: MIN A- MIN guard for ADLs; cues for safety during functional mobility; cues for sequencing of task for standing ADLs  Cognition: Cognition Overall Cognitive Status: Impaired/Different from baseline Orientation Level: Oriented to person, Oriented to place, Oriented to situation, Disoriented to time Cognition Arousal/Alertness: Awake/alert Behavior During Therapy: Flat affect Overall Cognitive Status: Impaired/Different from baseline Area of Impairment: Attention, Memory, Following commands, Safety/judgement, Awareness, Problem solving Orientation Level: Disoriented to, Time, Situation, Place Current Attention Level: Sustained Memory: Decreased short-term memory Following Commands: Follows one step commands with increased time Safety/Judgement: Decreased awareness of safety, Decreased awareness of deficits Awareness: Intellectual Problem Solving: Slow processing, Difficulty sequencing, Requires verbal cues, Requires tactile cues, Decreased initiation General Comments: When asked what day of the week it is, pt responds with "1, no 11." Decreased short term memory recall about day's events, decreased attention span. States her daughter is outside the room (which she was not).   Blood pressure 120/76, pulse 88, temperature 98.3 F (36.8 C), temperature source Oral, resp. rate 16, height 5' 6" (1.676 m), weight 107.5 kg, SpO2 100 %. Physical Exam  Nursing note and vitals  reviewed. Constitutional: She appears well-developed and well-nourished. No distress.  Hair shaved on R side for craniotomy site- incision healing-no drainage or erythema  HENT:  Head: Normocephalic and atraumatic.  Nose: Nose normal.  Mouth/Throat: Oropharynx is clear and moist. No oropharyngeal exudate.  Right crani incision C/D/I and healing well L facial droop notable with smile R tongue deviation also noted Upper midline gum erythema- a cut?  Eyes: No scleral icterus.  EOMs intact- has no nystagmus seen; Impaired/blurry vision- can barely see my outline at end of bed with L eye- with R eye, I'm blurry, but much more distinct, per pt.   Neck: Normal range of motion. Neck supple. No JVD present.  No thyromegaly present.  Cardiovascular: Normal rate and regular rhythm. Exam reveals no gallop and no friction rub.  No murmur heard. Respiratory: Effort normal and breath sounds normal. No respiratory distress. She has no wheezes. She has no rales.  GI: Soft. Bowel sounds are normal. She exhibits no distension. There is no abdominal tenderness.  RLQ wound with healthy beefy red tissue with minimal drainage. - packed Also has epigastric horizontal incision- glued? And appears to be healing well  Genitourinary:    Genitourinary Comments: Has a foley?- external?   Musculoskeletal: Normal range of motion.     Comments: Strength UEs- 4+ to 5-/5 in UEs- slightly weaker on LUE LEs- 4+ to 5-/5- slightly weaker on LLE Tested deltoid/biceps/triceps/WE/grip and finger abd Tested HF/KE/KF/DF/PF  Neurological: She is alert.  Oriented to self and place. Situation "for surgery on the belly" Able to answer some biographic questions but unable to recall month or date of admission, place of employment or her job. Had word finding deficits in addition to poor awareness of deficits. Able to follow simple motor commands without difficulty.  Intact sensation to light touch in all 4 extremities   Skin: Skin is  warm and dry. She is not diaphoretic.  allevyn on lumbar midline area- could not look underneath due to pt positioning.   Psychiatric: She has a normal mood and affect. Her behavior is normal.  Ox3 but very vague- didn't truly appear to understand why here and how much assistance she would need at home- cognition appears slightly impaired.     Results for orders placed or performed during the hospital encounter of 04/15/19 (from the past 48 hour(s))  Glucose, capillary     Status: Abnormal   Collection Time: 06/09/19  8:39 AM  Result Value Ref Range   Glucose-Capillary 180 (H) 70 - 99 mg/dL  Glucose, capillary     Status: Abnormal   Collection Time: 06/09/19 11:27 AM  Result Value Ref Range   Glucose-Capillary 208 (H) 70 - 99 mg/dL  Glucose, capillary     Status: Abnormal   Collection Time: 06/09/19  4:27 PM  Result Value Ref Range   Glucose-Capillary 257 (H) 70 - 99 mg/dL  Glucose, capillary     Status: Abnormal   Collection Time: 06/09/19  9:18 PM  Result Value Ref Range   Glucose-Capillary 165 (H) 70 - 99 mg/dL   Comment 1 Notify RN    Comment 2 Document in Chart   Glucose, capillary     Status: Abnormal   Collection Time: 06/10/19  8:24 AM  Result Value Ref Range   Glucose-Capillary 167 (H) 70 - 99 mg/dL  Renal function panel     Status: Abnormal   Collection Time: 06/10/19  8:58 AM  Result Value Ref Range   Sodium 135 135 - 145 mmol/L   Potassium 3.8 3.5 - 5.1 mmol/L   Chloride 102 98 - 111 mmol/L   CO2 22 22 - 32 mmol/L   Glucose, Bld 200 (H) 70 - 99 mg/dL   BUN 11 8 - 23 mg/dL   Creatinine, Ser 1.08 (H) 0.44 - 1.00 mg/dL   Calcium 9.3 8.9 - 10.3 mg/dL   Phosphorus 4.0 2.5 - 4.6 mg/dL   Albumin 3.2 (L) 3.5 - 5.0 g/dL   GFR calc non Af Amer 55 (L) >60 mL/min   GFR calc Af Amer >60 >60 mL/min   Anion gap 11 5 - 15    Comment: Performed at [facility], [address] [place], [place] [address]  Glucose, capillary     Status: Abnormal   Collection Time:  06/10/19 11:39 AM  Result Value Ref Range   Glucose-Capillary 288 (H) 70 - 99 mg/dL  Glucose, capillary     Status: Abnormal   Collection Time: 06/10/19  3:47 PM  Result Value Ref Range   Glucose-Capillary 246 (H) 70 - 99 mg/dL  Glucose, capillary     Status: Abnormal   Collection Time: 06/10/19  9:39 PM  Result Value Ref Range   Glucose-Capillary 180 (H) 70 - 99 mg/dL   Comment 1 Notify RN    Comment 2 Document in Chart    No results found.     Medical Problem List and Plan: 1.  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 Ordering PT/OT and SLP for functional deficits. 2.  Antithrombotics: -DVT/anticoagulation:  Pharmaceutical: Lovenox  -antiplatelet therapy: N/A 3. Pain Management: Tylenol as needed- doesn't complain of pain. 4. Mood: LCSW to follow for evaluation and support  -antipsychotic agents:/A 5. Neuropsych: This patient is not fully capable of making decisions on  own behalf. Need to determine who makes decisions for pt. 6. Skin/Wound Care: Continue Aquacel with dry dressing for R LQ wound.  Change daily.  Continue to offer nutritional supplements with meals as p.o. intake is poor 7. Fluids/Electrolytes/Nutrition: Will discontinue IV fluids.  Monitor intake/output.  Check electrolytes/CMP on Monday. 8.  HTN: Monitor blood pressures 3 times daily.  Continue amlodipine daily 9.  Anemia: H/H 8.5 on 9/16-->continue iron supplement twice daily 10.  T2DM: Blood sugars currently being managed with sliding scale insulin.  Was on glyburide PTA. Will continue to monitor blood sugars achs. Check hemoglobin A1c with next set of labs. 11.  Hydrocephalus s/p VP shunt and previous ventriculitis.:  Monitor for fevers or recurrent drainage with increase in activity. 12. Impaired vision- will  try to determine how much vision loss and if acute vs more chronic. 13. Impaired cognition- will order SLP for cognition, esp for word finding deficits and overall cognition.      Bary Leriche, PA-C 06/11/2019

## 2019-06-10 NOTE — Progress Notes (Signed)
Inpatient Rehabilitation-Admissions Coordinator   I have been notified by case managers at pt's [facility] that the patient does in fact of rehab benefits that are separate from inpatient hospital days. Pt has been approved for CIR and we will admit her to rehab.   I have approval from Dr. Zada Finders for admission Oct 3rd (saturday). Dr. Dagoberto Ligas will see patient Sauturday morning to confirm patient readiness. Pt's RN can call [facility] [phone]) by noon to get report.  Jhonnie Garner, OTR/L  Rehab Admissions Coordinator  [phone] 06/10/2019 4:14 PM

## 2019-06-10 NOTE — Progress Notes (Signed)
Neurosurgery Service Progress Note  Subjective: No acute events overnight, no new complaints  Objective: Vitals:   06/09/19 1517 06/09/19 1936 06/09/19 2345 06/10/19 0440  BP: 122/70 106/68 106/71 120/68  Pulse: 92 86 93 88  Resp: 16 16 16 16   Temp: 99 F (37.2 C) 98.8 F (37.1 C) 98 F (36.7 C) 98.4 F (36.9 C)  TempSrc: Oral Oral Oral Oral  SpO2: 100% 99% 100% 100%  Weight:      Height:       Temp (24hrs), Avg:98.6 F (37 C), Min:98 F (36.7 C), Max:99 F (37.2 C)  CBC Latest Ref Rng & Units 05/25/2019 05/24/2019 05/23/2019  WBC 4.0 - 10.5 K/uL 5.9 6.4 6.2  Hemoglobin 12.0 - 15.0 g/dL 8.5(L) 7.6(L) 7.8(L)  Hematocrit 36.0 - 46.0 % 28.8(L) 24.5(L) 26.5(L)  Platelets 150 - 400 K/uL 273 252 264   BMP Latest Ref Rng & Units 06/08/2019 06/06/2019 06/03/2019  Glucose 70 - 99 mg/dL 232(H) 182(H) 270(H)  BUN 8 - 23 mg/dL 10 15 11   Creatinine 0.44 - 1.00 mg/dL 1.12(H) 1.15(H) 1.17(H)  Sodium 135 - 145 mmol/L 133(L) 134(L) 143  Potassium 3.5 - 5.1 mmol/L 4.1 4.1 4.1  Chloride 98 - 111 mmol/L 99 99 111  CO2 22 - 32 mmol/L 21(L) 22 21(L)  Calcium 8.9 - 10.3 mg/dL 9.2 9.4 9.5    Intake/Output Summary (Last 24 hours) at 06/10/2019 [id number] Last data filed at 06/09/2019 0945 Gross per 24 hour  Intake 240 ml  Output -  Net 240 ml    Current Facility-Administered Medications:  .  0.9 %  sodium chloride infusion, , Intravenous, PRN, Judith Part, MD, Stopped at 05/25/19 1657 .  0.9 %  sodium chloride infusion, , Intravenous, Continuous, , Joyice Faster, MD, Stopped at 06/01/19 [phone] .  acetaminophen (TYLENOL) tablet 650 mg, 650 mg, Oral, Q4H PRN, 650 mg at 05/24/19 2205 **OR** acetaminophen (TYLENOL) suppository 650 mg, 650 mg, Rectal, Q4H PRN, Judith Part, MD, 650 mg at 05/16/19 0746 .  alum & mag hydroxide-simeth (MAALOX/MYLANTA) 200-200-20 MG/5ML suspension 30 mL, 30 mL, Oral, Q6H PRN, Judith Part, MD, 30 mL at 05/05/19 1523 .  amLODipine (NORVASC) tablet 10  mg, 10 mg, Oral, Daily, Judith Part, MD, 10 mg at 06/09/19 0928 .  atorvastatin (LIPITOR) tablet 40 mg, 40 mg, Oral, Daily, Judith Part, MD, 40 mg at 06/09/19 1716 .  docusate sodium (COLACE) capsule 100 mg, 100 mg, Oral, BID, Judith Part, MD, 100 mg at 06/09/19 2206 .  feeding supplement (BOOST / RESOURCE BREEZE) liquid 1 Container, 1 Container, Oral, TID BM, Judith Part, MD, 1 Container at 06/09/19 2211 .  feeding supplement (ENSURE ENLIVE) (ENSURE ENLIVE) liquid 237 mL, 237 mL, Oral, TID BM, ,  A, MD, 237 mL at 06/09/19 2210 .  gabapentin (NEURONTIN) capsule 300 mg, 300 mg, Oral, BID, Judith Part, MD, 300 mg at 06/09/19 2206 .  Gerhardt's butt cream, , Topical, BID, ,  A, MD .  heparin injection 5,000 Units, 5,000 Units, Subcutaneous, Q8H, , Joyice Faster, MD, 5,000 Units at 06/10/19 0510 .  HYDROcodone-acetaminophen (NORCO/VICODIN) 5-325 MG per tablet 1 tablet, 1 tablet, Oral, Q4H PRN, Judith Part, MD, 1 tablet at 06/01/19 2041 .  HYDROmorphone (DILAUDID) injection 0.5 mg, 0.5 mg, Intravenous, Q3H PRN, Judith Part, MD, 0.5 mg at 04/26/19 0939 .  insulin aspart (novoLOG) injection 0-20 Units, 0-20 Units, Subcutaneous, TID WC, Judith Part, MD, 11 Units at 06/09/19  1725 .  insulin aspart (novoLOG) injection 0-5 Units, 0-5 Units, Subcutaneous, QHS, Judith Part, MD, 2 Units at 06/08/19 2205 .  iron polysaccharides (NIFEREX) capsule 150 mg, 150 mg, Oral, BID, Judith Part, MD, 150 mg at 06/09/19 2206 .  labetalol (NORMODYNE) injection 10-40 mg, 10-40 mg, Intravenous, Q10 min PRN, Judith Part, MD, 20 mg at 05/20/19 0310 .  lidocaine (PF) (XYLOCAINE) 1 % injection, , , PRN, Jacqulynn Cadet, MD, 10 mL at 04/19/19 1436 .  lip balm (CARMEX) ointment, , Topical, PRN, Judith Part, MD .  ondansetron (ZOFRAN) tablet 4 mg, 4 mg, Oral, Q4H PRN, 4 mg at 04/30/19 1043 **OR** ondansetron  (ZOFRAN) injection 4 mg, 4 mg, Intravenous, Q4H PRN, Judith Part, MD, 4 mg at 05/16/19 0910 .  pantoprazole (PROTONIX) EC tablet 40 mg, 40 mg, Oral, Daily, Judith Part, MD, 40 mg at 06/09/19 0928 .  polyethylene glycol (MIRALAX / GLYCOLAX) packet 17 g, 17 g, Oral, Daily PRN, Judith Part, MD, 17 g at 06/05/19 0915 .  promethazine (PHENERGAN) injection 12.5-25 mg, 12.5-25 mg, Intravenous, Q4H PRN, Judith Part, MD, 25 mg at 05/15/19 2239 .  promethazine (PHENERGAN) tablet 12.5-25 mg, 12.5-25 mg, Oral, Q4H PRN, Judith Part, MD .  senna (SENOKOT) tablet 8.6 mg, 1 tablet, Oral, BID, Judith Part, MD, 8.6 mg at 06/09/19 2207 .  sodium chloride (OCEAN) 0.65 % nasal spray 4 spray, 4 spray, Each Nare, PRN, Judith Part, MD .  tiZANidine (ZANAFLEX) tablet 4 mg, 4 mg, Oral, Q8H PRN, Judith Part, MD, 4 mg at 04/20/19 1554   Physical Exam: Awake, Ox3, PERRL, EOMI, FS, Strength 5/5 x4, SILTx4, VFF with mild bitemporal hemianopsia Scalp/relay site incisions healing well, abdominal incisions packed on R, healing well on L and subxiphoid  Assessment & Plan: 63 y.o. 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 Failed bedside lumbar drain placement. 8/11 LD placed under fluoro, minimal output, 8/14 LD removed; 8/16 rhinorrhea, LD replaced, 8/19 somnolence, CTH w/ large volume pneumocephalus, LD clamped, 8/21 s/p repeat endonasal skull base reconstruction, 8/25 rpt CTH with resolving pneumocephalus, febrile started on vanc/cefepime, 9/19 finished course of ABx, 9/22 s/p R VPS placement & LD removal  -insurance won't pay for CIR, CSW working w/ family to secure SNF placement -SCDs/TEDs, Jackey Loge  06/10/19 8:22 AM

## 2019-06-11 ENCOUNTER — Inpatient Hospital Stay (HOSPITAL_COMMUNITY)
Admit: 2019-06-11 | Discharge: 2019-06-21 | DRG: 945 | Disposition: A | Discharge status: Home Health | Payer: BC Managed Care – PPO | Source: Intra-hospital | Attending: Physical Medicine & Rehabilitation | Admitting: Physical Medicine & Rehabilitation

## 2019-06-11 ENCOUNTER — Encounter (HOSPITAL_COMMUNITY): Payer: Self-pay

## 2019-06-11 ENCOUNTER — Other Ambulatory Visit: Payer: Self-pay

## 2019-06-11 DIAGNOSIS — G0490 Encephalitis and encephalomyelitis, unspecified: Secondary | ICD-10-CM | POA: Diagnosis present

## 2019-06-11 DIAGNOSIS — Z9221 Personal history of antineoplastic chemotherapy: Secondary | ICD-10-CM | POA: Diagnosis not present

## 2019-06-11 DIAGNOSIS — G6289 Other specified polyneuropathies: Secondary | ICD-10-CM

## 2019-06-11 DIAGNOSIS — Z7984 Long term (current) use of oral hypoglycemic drugs: Secondary | ICD-10-CM | POA: Diagnosis not present

## 2019-06-11 DIAGNOSIS — D62 Acute posthemorrhagic anemia: Secondary | ICD-10-CM | POA: Diagnosis present

## 2019-06-11 DIAGNOSIS — I10 Essential (primary) hypertension: Secondary | ICD-10-CM | POA: Diagnosis not present

## 2019-06-11 DIAGNOSIS — Z923 Personal history of irradiation: Secondary | ICD-10-CM | POA: Diagnosis not present

## 2019-06-11 DIAGNOSIS — Z87891 Personal history of nicotine dependence: Secondary | ICD-10-CM

## 2019-06-11 DIAGNOSIS — R7401 Elevation of levels of liver transaminase levels: Secondary | ICD-10-CM

## 2019-06-11 DIAGNOSIS — Z8049 Family history of malignant neoplasm of other genital organs: Secondary | ICD-10-CM | POA: Diagnosis not present

## 2019-06-11 DIAGNOSIS — E1122 Type 2 diabetes mellitus with diabetic chronic kidney disease: Secondary | ICD-10-CM | POA: Diagnosis present

## 2019-06-11 DIAGNOSIS — Z791 Long term (current) use of non-steroidal anti-inflammatories (NSAID): Secondary | ICD-10-CM | POA: Diagnosis not present

## 2019-06-11 DIAGNOSIS — Z6838 Body mass index (BMI) 38.0-38.9, adult: Secondary | ICD-10-CM | POA: Diagnosis not present

## 2019-06-11 DIAGNOSIS — Z90710 Acquired absence of both cervix and uterus: Secondary | ICD-10-CM | POA: Diagnosis not present

## 2019-06-11 DIAGNOSIS — E1165 Type 2 diabetes mellitus with hyperglycemia: Secondary | ICD-10-CM

## 2019-06-11 DIAGNOSIS — R5381 Other malaise: Principal | ICD-10-CM | POA: Diagnosis present

## 2019-06-11 DIAGNOSIS — Z853 Personal history of malignant neoplasm of breast: Secondary | ICD-10-CM | POA: Diagnosis not present

## 2019-06-11 DIAGNOSIS — R5082 Postprocedural fever: Secondary | ICD-10-CM

## 2019-06-11 DIAGNOSIS — C50511 Malignant neoplasm of lower-outer quadrant of right female breast: Secondary | ICD-10-CM

## 2019-06-11 DIAGNOSIS — D638 Anemia in other chronic diseases classified elsewhere: Secondary | ICD-10-CM

## 2019-06-11 DIAGNOSIS — H547 Unspecified visual loss: Secondary | ICD-10-CM | POA: Diagnosis present

## 2019-06-11 DIAGNOSIS — Z79899 Other long term (current) drug therapy: Secondary | ICD-10-CM

## 2019-06-11 DIAGNOSIS — I129 Hypertensive chronic kidney disease with stage 1 through stage 4 chronic kidney disease, or unspecified chronic kidney disease: Secondary | ICD-10-CM | POA: Diagnosis present

## 2019-06-11 DIAGNOSIS — E7800 Pure hypercholesterolemia, unspecified: Secondary | ICD-10-CM | POA: Diagnosis present

## 2019-06-11 DIAGNOSIS — Z803 Family history of malignant neoplasm of breast: Secondary | ICD-10-CM

## 2019-06-11 DIAGNOSIS — N182 Chronic kidney disease, stage 2 (mild): Secondary | ICD-10-CM

## 2019-06-11 DIAGNOSIS — Z982 Presence of cerebrospinal fluid drainage device: Secondary | ICD-10-CM

## 2019-06-11 DIAGNOSIS — G9600 Cerebrospinal fluid leak, unspecified: Secondary | ICD-10-CM

## 2019-06-11 DIAGNOSIS — N179 Acute kidney failure, unspecified: Secondary | ICD-10-CM

## 2019-06-11 LAB — GLUCOSE, CAPILLARY
Glucose-Capillary: 178 mg/dL — ABNORMAL HIGH (ref 70–99)
Glucose-Capillary: 285 mg/dL — ABNORMAL HIGH (ref 70–99)
Glucose-Capillary: 247 mg/dL — ABNORMAL HIGH (ref 70–99)
Glucose-Capillary: 277 mg/dL — ABNORMAL HIGH (ref 70–99)

## 2019-06-11 MED ORDER — SENNOSIDES-DOCUSATE SODIUM 8.6-50 MG PO TABS
2.0000 | ORAL_TABLET | Freq: Two times a day (BID) | ORAL | Status: DC
  Administered 2019-06-11 – 2019-06-20 (×17): 2 via ORAL
  Filled 2019-06-11 (×20): qty 2

## 2019-06-11 MED ORDER — POLYETHYLENE GLYCOL 3350 17 G PO PACK: 17.0000 g | PACK | Freq: Every day | ORAL | Status: DC | PRN

## 2019-06-11 MED ORDER — HYDROCODONE-ACETAMINOPHEN 5-325 MG PO TABS
1.0000 | ORAL_TABLET | ORAL | Status: DC | PRN
  Administered 2019-06-17: 1 via ORAL
  Filled 2019-06-11: qty 1

## 2019-06-11 MED ORDER — PANTOPRAZOLE SODIUM 40 MG PO TBEC
40.0000 mg | DELAYED_RELEASE_TABLET | Freq: Every day | ORAL | Status: DC
  Administered 2019-06-12 – 2019-06-21 (×10): 40 mg via ORAL
  Filled 2019-06-11 (×10): qty 1

## 2019-06-11 MED ORDER — ATORVASTATIN CALCIUM 40 MG PO TABS
40.0000 mg | ORAL_TABLET | Freq: Every day | ORAL | Status: DC
  Administered 2019-06-11 – 2019-06-20 (×10): 40 mg via ORAL
  Filled 2019-06-11 (×10): qty 1

## 2019-06-11 MED ORDER — INSULIN ASPART 100 UNIT/ML ~~LOC~~ SOLN
0.0000 [IU] | Freq: Three times a day (TID) | SUBCUTANEOUS | Status: DC
  Administered 2019-06-11: 7 [IU] via SUBCUTANEOUS
  Administered 2019-06-12: 4 [IU] via SUBCUTANEOUS
  Administered 2019-06-12: 7 [IU] via SUBCUTANEOUS
  Administered 2019-06-12 – 2019-06-14 (×7): 4 [IU] via SUBCUTANEOUS
  Administered 2019-06-15: 3 [IU] via SUBCUTANEOUS
  Administered 2019-06-15 (×2): 4 [IU] via SUBCUTANEOUS
  Administered 2019-06-16 (×2): 3 [IU] via SUBCUTANEOUS
  Administered 2019-06-17: 4 [IU] via SUBCUTANEOUS
  Administered 2019-06-18: 3 [IU] via SUBCUTANEOUS
  Administered 2019-06-18 – 2019-06-19 (×2): 7 [IU] via SUBCUTANEOUS
  Administered 2019-06-19 – 2019-06-20 (×3): 3 [IU] via SUBCUTANEOUS
  Administered 2019-06-20: 4 [IU] via SUBCUTANEOUS
  Administered 2019-06-21: 3 [IU] via SUBCUTANEOUS
  Administered 2019-06-21: 4 [IU] via SUBCUTANEOUS

## 2019-06-11 MED ORDER — ONDANSETRON HCL 4 MG/2ML IJ SOLN: 4.0000 mg | INTRAMUSCULAR | Status: DC | PRN

## 2019-06-11 MED ORDER — TIZANIDINE HCL 2 MG PO TABS: 2.0000 mg | ORAL_TABLET | Freq: Three times a day (TID) | ORAL | Status: DC | PRN

## 2019-06-11 MED ORDER — ONDANSETRON HCL 4 MG PO TABS: 4.0000 mg | ORAL_TABLET | ORAL | Status: DC | PRN

## 2019-06-11 MED ORDER — BOOST / RESOURCE BREEZE PO LIQD CUSTOM
1.0000 | Freq: Three times a day (TID) | ORAL | Status: DC
  Administered 2019-06-11 – 2019-06-21 (×17): 1 via ORAL

## 2019-06-11 MED ORDER — GABAPENTIN 300 MG PO CAPS
300.0000 mg | ORAL_CAPSULE | Freq: Two times a day (BID) | ORAL | Status: DC
  Administered 2019-06-11 – 2019-06-21 (×20): 300 mg via ORAL
  Filled 2019-06-11 (×20): qty 1

## 2019-06-11 MED ORDER — PROCHLORPERAZINE 25 MG RE SUPP: 12.5000 mg | Freq: Four times a day (QID) | RECTAL | Status: DC | PRN

## 2019-06-11 MED ORDER — BISACODYL 10 MG RE SUPP: 10.0000 mg | Freq: Every day | RECTAL | Status: DC | PRN

## 2019-06-11 MED ORDER — TRAZODONE HCL 50 MG PO TABS
25.0000 mg | ORAL_TABLET | Freq: Every evening | ORAL | Status: DC | PRN
  Administered 2019-06-14 – 2019-06-15 (×2): 50 mg via ORAL
  Filled 2019-06-11 (×2): qty 1

## 2019-06-11 MED ORDER — PROCHLORPERAZINE MALEATE 5 MG PO TABS: 5.0000 mg | ORAL_TABLET | Freq: Four times a day (QID) | ORAL | Status: DC | PRN

## 2019-06-11 MED ORDER — POLYSACCHARIDE IRON COMPLEX 150 MG PO CAPS
150.0000 mg | ORAL_CAPSULE | Freq: Two times a day (BID) | ORAL | Status: DC
  Administered 2019-06-11 – 2019-06-21 (×20): 150 mg via ORAL
  Filled 2019-06-11 (×20): qty 1

## 2019-06-11 MED ORDER — ACETAMINOPHEN 325 MG PO TABS
325.0000 mg | ORAL_TABLET | ORAL | Status: DC | PRN
  Administered 2019-06-14 – 2019-06-15 (×2): 650 mg via ORAL
  Filled 2019-06-11 (×2): qty 2

## 2019-06-11 MED ORDER — SALINE SPRAY 0.65 % NA SOLN
4.0000 | NASAL | Status: DC | PRN
  Filled 2019-06-11: qty 44

## 2019-06-11 MED ORDER — ALUM & MAG HYDROXIDE-SIMETH 200-200-20 MG/5ML PO SUSP
30.0000 mL | ORAL | Status: DC | PRN
  Administered 2019-06-19: 30 mL via ORAL
  Filled 2019-06-11 (×2): qty 30

## 2019-06-11 MED ORDER — GERHARDT'S BUTT CREAM
TOPICAL_CREAM | Freq: Two times a day (BID) | CUTANEOUS | Status: DC
  Administered 2019-06-11 – 2019-06-21 (×18): via TOPICAL
  Filled 2019-06-11: qty 1

## 2019-06-11 MED ORDER — DIPHENHYDRAMINE HCL 12.5 MG/5ML PO ELIX: 12.5000 mg | ORAL_SOLUTION | Freq: Four times a day (QID) | ORAL | Status: DC | PRN

## 2019-06-11 MED ORDER — AMLODIPINE BESYLATE 10 MG PO TABS
10.0000 mg | ORAL_TABLET | Freq: Every day | ORAL | Status: DC
  Administered 2019-06-12 – 2019-06-21 (×10): 10 mg via ORAL
  Filled 2019-06-11 (×10): qty 1

## 2019-06-11 MED ORDER — LIP MEDEX EX OINT
TOPICAL_OINTMENT | CUTANEOUS | Status: DC | PRN
  Filled 2019-06-11: qty 7

## 2019-06-11 MED ORDER — SODIUM CHLORIDE 0.9 % IV SOLN
INTRAVENOUS | Status: DC
  Administered 2019-06-11 – 2019-06-12 (×3): via INTRAVENOUS

## 2019-06-11 MED ORDER — INSULIN ASPART 100 UNIT/ML ~~LOC~~ SOLN
0.0000 [IU] | Freq: Every day | SUBCUTANEOUS | Status: DC
  Administered 2019-06-11: 3 [IU] via SUBCUTANEOUS
  Administered 2019-06-20: 2 [IU] via SUBCUTANEOUS

## 2019-06-11 MED ORDER — GUAIFENESIN-DM 100-10 MG/5ML PO SYRP: 5.0000 mL | ORAL_SOLUTION | Freq: Four times a day (QID) | ORAL | Status: DC | PRN

## 2019-06-11 MED ORDER — ENSURE ENLIVE PO LIQD
237.0000 mL | Freq: Three times a day (TID) | ORAL | Status: DC
  Administered 2019-06-12 – 2019-06-18 (×8): 237 mL via ORAL

## 2019-06-11 MED ORDER — FLEET ENEMA 7-19 GM/118ML RE ENEM: 1.0000 | ENEMA | Freq: Once | RECTAL | Status: DC | PRN

## 2019-06-11 MED ORDER — PROCHLORPERAZINE EDISYLATE 10 MG/2ML IJ SOLN: 5.0000 mg | Freq: Four times a day (QID) | INTRAMUSCULAR | Status: DC | PRN

## 2019-06-11 MED ORDER — HEPARIN SODIUM (PORCINE) 5000 UNIT/ML IJ SOLN
5000.0000 [IU] | Freq: Three times a day (TID) | INTRAMUSCULAR | Status: DC
  Administered 2019-06-11 – 2019-06-21 (×30): 5000 [IU] via SUBCUTANEOUS
  Filled 2019-06-11 (×31): qty 1

## 2019-06-11 MED ORDER — ENOXAPARIN SODIUM 40 MG/0.4ML ~~LOC~~ SOLN: 40.0000 mg | SUBCUTANEOUS | Status: DC

## 2019-06-11 NOTE — Progress Notes (Signed)
Patient arrived to unit, no s/s of distress. Patient A&Ox4, belongings at bedside, no complications noted at this time. Safety precautions in place.  Audie Clear, LPN

## 2019-06-11 NOTE — Progress Notes (Signed)
Jamse Arn, MD  Physician  Physical Medicine and Rehabilitation  Consult Note  Signed  Date of Service:  06/03/2019  8:37 AM      Related encounter: Admission (Discharged) from 04/15/2019 in [place] Collapse All            Physical Medicine and Rehabilitation Consult     Reason for Consult: Meningitis  Referring Physician: Dr. Zada Finders     HPI: [name] is a 63 y.o. 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  History taken from chart review due to cognition.  She was admitted on 04/15/2019 for repeat endoscopic transphenoidal resection of tumor by Drs. Ostergard and [employer]. Post op course complicated by fevers, confusion, cognitive deficits, visual disturbance within the right temporal field, nasal drainage, mobility.  She was taken to OR for lumbar drain placement on 04/24/2019.  Follow-up head CT on 04/27/2019 showed large pneumocephalus with mass-effect on the right frontal lobe and diffuse cerebral sulcal effacement as well as posterior displacement of brainstem.  This was thought to be secondary to train and therefore drain was clamped. She was taken back to OR on 04/29/2019 for repair of CSF leak with abdominal wound revision. On 05/04/2019 she developed recurrent fever with tachycardia. Was monitored on empiric antibiotics, Lumbar drain clamped as drainage resolved but on 8/31, she had recurrent drainage therefore placed on bedrest. On 09/06, she developed hypoxia with fever and tachycardia with decline in mental status. CSF culture showed 830 WBC with CSF protein -488.  ID consulted for input and she was started on Vanc/Cefepime for bacterial meningitis.   She continued to have fevers and antibiotic coverage  broadened. MRI brain revealing small volume infectious debris layering in lateral and 4th ventricle and moderate to severe paranasal sinus with superimposed moderate to severe paranasal sinus mucosal thickening. CSF cultures and blood cultures negative and ID question ENT evaluation of sinuses as well as shunt revision. Dr. Zada Finders felt that changes due to ventriculitis. Patient developed lethargy with attempts at LD clamping on 05/25/2019 therefore drain unclamped. She continued to have decline in MS with CT head showing concerns of hydrocephalus. Antibiotics discontinued on 9/18 as she was afebrile X 72 hours.  She was taken to the OR again on 05/31/2023 removal of lumbar drain and placement of VPS.  She tolerated extubation without difficulty and therapy evaluations completed yesterday revealing significant cognitive deficits, apraxia with ADL tasks, difficulty initiating RLE with mobility. Follow up CT head today to see if shunt needs to be adjusted. CIR recommended due to functional decline.       Review of Systems  Unable to perform ROS: Mental acuity          Past Medical History:  Diagnosis Date  . Anemia    . Breast cancer ([place]) 12/29/13    right 6:00 o'clock, lower outer  . Cataract of left eye    . Diabetes mellitus      type 2  . Family history of malignant neoplasm of breast    . Glaucoma      MD just watching, no eye drops  . High cholesterol    . History of blood transfusion 2015    with chemo treatments  .  History of radiation therapy 08/12/14- 10/11/14    right breast /50.4 Gy/28 fx, right breast boost/ 10 Gy/ 5 fx  . HTN (hypertension)    . Personal history of chemotherapy    . Personal history of radiation therapy    . Pituitary adenoma ([place]) 10/30/2018  . Wears dentures      full top-partial bottom  . Wears dentures      full  . Wears glasses    . Wears glasses            Past Surgical History:  Procedure Laterality Date  . ABDOMINAL HYSTERECTOMY   1993     endometriosis  . BREAST BIOPSY Left 12/29/2013  . BREAST LUMPECTOMY Right 02/08/2014  . BREAST LUMPECTOMY WITH AXILLARY LYMPH NODE BIOPSY Right 02/08/14    invasive ductal  . CRANIOTOMY N/A 04/15/2019    Procedure: Transphenoid resection of pituitary tumor with fat graft;  Surgeon: Judith Part, MD;  Location: [facility];  Service: Neurosurgery;  Laterality: N/A;  . IR IMAGE GUIDED FLUID DRAIN BY CATHETER   04/19/2019  . MULTIPLE TOOTH EXTRACTIONS      . PLACEMENT OF LUMBAR DRAIN N/A 04/24/2019    Procedure: PLACEMENT OF LUMBAR DRAIN;  Surgeon: Eustace Moore, MD;  Location: [facility];  Service: Neurosurgery;  Laterality: N/A;  . PLACEMENT OF LUMBAR DRAIN N/A 05/16/2019    Procedure: PLACEMENT OF LUMBAR DRAIN;  Surgeon: Newman Pies, MD;  Location: [facility];  Service: Neurosurgery;  Laterality: N/A;  PLACEMENT OF LUMBAR DRAIN  . PORT-A-CATH REMOVAL      . PORTACATH PLACEMENT Left 02/08/2014    Procedure: INSERTION PORT-A-CATH;  Surgeon: Rolm Bookbinder, MD;  Location: [facility];  Service: General;  Laterality: Left;  . REPAIR OF CEREBROSPINAL FLUID LEAK N/A 04/29/2019    Procedure: Endoscopic Endonasal Cerebrospinal Fluid Leak repair, Abdominal wound revision and fat graft harvest, Intrathecal Fluorocein Injection;  Surgeon: Judith Part, MD;  Location: [facility];  Service: Neurosurgery;  Laterality: N/A;  . TONSILLECTOMY      . TRANSNASAL APPROACH N/A 04/15/2019    Procedure: TRANSNASAL APPROACH;  Surgeon: Jerrell Belfast, MD;  Location: [facility];  Service: ENT;  Laterality: N/A;  . TRANSPHENOIDAL APPROACH EXPOSURE N/A 04/15/2019    Procedure: ENDOSCOPIC TRANS NASAL APPROACH WITH FUSIONN;  Surgeon: Jerrell Belfast, MD;  Location: [facility];  Service: ENT;  Laterality: N/A;  . TRANSPHENOIDAL APPROACH EXPOSURE N/A 04/29/2019    Procedure: Endoscopic repair of spinal fluid leak ;  Surgeon: Jerrell Belfast, MD;  Location: [facility];  Service: ENT;  Laterality: N/A;  . TRANSPHENOIDAL PITUITARY  RESECTION   2006  . VENTRICULOPERITONEAL SHUNT Right 05/31/2019    Procedure: SHUNT INSERTION VENTRICULAR-PERITONEAL; REMOVAL OF LUMBAR DRAIN;  Surgeon: Judith Part, MD;  Location: [facility];  Service: Neurosurgery;  Laterality: Right;  SHUNT INSERTION VENTRICULAR-PERITONEAL; REMOVAL OF LUMBAR DRAIN  . WISDOM TOOTH EXTRACTION              Family History  Problem Relation Age of Onset  . Hypertension Mother    . Diabetes type II Mother    . Breast cancer Mother          dx ~8; deceased 46  . Hypertension Father    . Breast cancer Maternal Aunt          deceased 95  . Cancer Maternal Uncle          unk. type; deceased 89s  . Cancer Maternal Uncle  unk. type; deceased late 72s  . Uterine cancer Cousin 46        daughter of an unaffected mat aunt who is 44     Social History:  Single. Worked as a Wellsite geologist. She reports that she quit smoking about 25 years ago. Her smoking use included cigarettes. She has never used smokeless tobacco. She reports 'a little bit' of alcohol use. She reports that she does not use drugs.      Allergies: No Known Allergies            Medications Prior to Admission  Medication Sig Dispense Refill  . amLODipine (NORVASC) 10 MG tablet Take 10 mg by mouth daily.       Marland Kitchen atorvastatin (LIPITOR) 40 MG tablet Take 40 mg by mouth daily.      . diclofenac (VOLTAREN) 75 MG EC tablet Take 75 mg by mouth 2 (two) times daily.      Marland Kitchen FERREX 150 150 MG capsule Take 150 mg by mouth 2 (two) times daily.      Marland Kitchen gabapentin (NEURONTIN) 300 MG capsule TAKE 1 CAPSULE BY MOUTH AT BEDTIME (Patient taking differently: Take 300 mg by mouth 3 (three) times daily. ) 30 capsule 4  . glucose monitoring kit (FREESTYLE) monitoring kit One Touch Ultra machine and strips, patient tests her blood sugars bid      . glyBURIDE (DIABETA) 5 MG tablet Take 5 mg by mouth 2 (two) times daily with a meal.       . lisinopril-hydrochlorothiazide (ZESTORETIC) 20-12.5 MG  tablet Take 2 tablets by mouth daily.      . metFORMIN (GLUCOPHAGE-XR) 500 MG 24 hr tablet Take 1,000 mg by mouth daily with breakfast.       . ONE TOUCH ULTRA TEST test strip        . tiZANidine (ZANAFLEX) 4 MG tablet Take 4 mg by mouth every 8 (eight) hours as needed for muscle spasms.      . Vitamin D, Ergocalciferol, (DRISDOL) 1.25 MG (50000 UT) CAPS capsule Take 50,000 Units by mouth every Sunday.          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  Totally independent.    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 Functional Status:  Mobility: Bed Mobility Overal bed mobility: Needs Assistance Bed Mobility: Supine to Sit, Sit to Supine Supine to sit: Mod assist Sit to supine: Mod assist General bed mobility comments: +rail, increased time and effort Transfers Overall transfer level: Needs assistance Equipment used: Rolling walker (2 wheeled) Transfers: Sit to/from Stand Sit to Stand: Mod assist Stand pivot transfers: Mod assist General transfer comment: assist to power up and stabilize balance Ambulation/Gait General Gait Details: uanble due to weakness     ADL: ADL Overall ADL's : Needs assistance/impaired Eating/Feeding: Minimal assistance, Cueing for sequencing, Sitting Eating/Feeding Details (indicate cue type and reason): Pt required cues to cut food before eating and to open ice cream before attempting to eat it.   Grooming: Minimal assistance, Sitting, Cueing for sequencing Grooming Details (indicate cue type and reason): cues required to put toothpaste on  brush before brushing. Upper Body Bathing: Moderate assistance, Sitting, Cueing for sequencing  Lower Body Bathing: Maximal assistance, Sit to/from stand, Cueing for compensatory techniques, Cueing for sequencing Lower Body Bathing Details (indicate cue type and reason): Pt fatigues quickly. Requires cues to continue the task and stay focused.   Upper Body Dressing : Moderate assistance, Sitting Lower Body Dressing: Maximal assistance, Sit to/from stand, Cueing for compensatory techniques, Cueing for sequencing Toilet Transfer: Moderate assistance, Stand-pivot, BSC, RW Toileting- Clothing Manipulation and Hygiene: Total assistance, Sit to/from stand Toileting - Clothing Manipulation Details (indicate cue type and reason): Therapist assists pt with standing while second person manages clothing. Functional mobility during ADLs: Moderate assistance, Rolling walker General ADL Comments: Overall min to mod assist due to general weakness from being bed bound so long and from decreased cognition.   Cognition: Cognition Overall Cognitive Status: Impaired/Different from baseline Orientation Level: Oriented to person, Oriented to situation, Disoriented to place, Disoriented to time Cognition Arousal/Alertness: Lethargic Behavior During Therapy: Flat affect, WFL for tasks assessed/performed Overall Cognitive Status: Impaired/Different from baseline Area of Impairment: Orientation, Memory, Attention, Following commands, Safety/judgement, Awareness, Problem solving Orientation Level: Disoriented to, Place, Time, Situation Current Attention Level: Focused Memory: Decreased short-term memory Following Commands: Follows one step commands with increased time, Follows multi-step commands inconsistently Safety/Judgement: Decreased awareness of safety, Decreased awareness of deficits Awareness: Intellectual Problem Solving: Slow processing, Difficulty sequencing, Requires verbal cues, Requires tactile  cues General Comments: continuous cues to stay on task. Easily distracted.     Blood pressure 128/77, pulse 94, temperature 98 F (36.7 C), temperature source Oral, resp. rate 16, height '5\' 6"'$  (1.676 m), weight 107.5 kg, SpO2 100 %. Physical Exam  Nursing note and vitals reviewed. Constitutional: She appears well-developed.  Morbidly obese  HENT:  Head: Normocephalic and atraumatic.  Eyes: Right eye exhibits no discharge. Left eye exhibits no discharge.  EOM impaired?  Neck: No tracheal deviation present. No thyromegaly present.  Respiratory: Effort normal.  GI: Soft. She exhibits distension.  Musculoskeletal:     Comments: No edema or tenderness in extremities  Neurological:  Alert and oriented to name only  Motor: Grossly 4-/5 throughout  Skin: Skin is warm and dry.  Psychiatric:  Unable to assess due to cognition      Lab Results Last 24 Hours       Results for orders placed or performed during the hospital encounter of 04/15/19 (from the past 24 hour(s))  Glucose, capillary     Status: Abnormal    Collection Time: 06/02/19 12:48 PM  Result Value Ref Range    Glucose-Capillary 255 (H) 70 - 99 mg/dL  Glucose, capillary     Status: Abnormal    Collection Time: 06/02/19  4:47 PM  Result Value Ref Range    Glucose-Capillary 226 (H) 70 - 99 mg/dL  Glucose, capillary     Status: Abnormal    Collection Time: 06/02/19  9:34 PM  Result Value Ref Range    Glucose-Capillary 292 (H) 70 - 99 mg/dL     Imaging Results (Last 48 hours)  No results found.     Assessment/Plan: Diagnosis: Ventriculitis with hydrocephalus Labsindependently reviewed.  Records reviewed and summated above.   1. Does the need for close, 24 hr/day medical supervision in concert with the patient's rehab needs make it unreasonable for this patient to be served in a less intensive setting? Yes  2. Co-Morbidities requiring supervision/potential complications: T2 DM (Monitor in accordance with exercise and  adjust meds as necessary), pituitary tumor resection 12/06, breast cancer s/p XRT/chemo 2015, bacterial meningitis, CKD (avoid  nephrotoxic meds, follow-up labs), ABLA (repeat labs, consider transfusion if necessary to ensure appropriate perfusion for increased activity tolerance) 3. Due to bladder management, bowel management, safety, skin/wound care, disease management, medication administration and patient education, does the patient require 24 hr/day rehab nursing? Yes 4. Does the patient require coordinated care of a physician, rehab nurse, PT (1-2 hrs/day, 5 days/week), OT (1-2 hrs/day, 5 days/week) and SLP (1-2 hrs/day, 5 days/week) to address physical and functional deficits in the context of the above medical diagnosis(es)? Yes Addressing deficits in the following areas: balance, endurance, locomotion, strength, transferring, bowel/bladder control, bathing, dressing, feeding, grooming, toileting, cognition, speech, language and psychosocial support 5. Can the patient actively participate in an intensive therapy program of at least 3 hrs of therapy per day at least 5 days per week? Potentially 6. The potential for patient to make measurable gains while on inpatient rehab is excellent 7. Anticipated functional outcomes upon discharge from inpatient rehab are supervision  with PT, supervision with OT, supervision with SLP. 8. Estimated rehab length of stay to reach the above functional goals is: 15-19 days. 9. Anticipated D/C setting: Home 10. Anticipated post D/C treatments: HH therapy and Home excercise program 11. Overall Rehab/Functional Prognosis: good   RECOMMENDATIONS: This patient's condition is appropriate for continued rehabilitative care in the following setting: CIR when medically stable and able to tolerate 3 hours of therapy per day if caregiver support available upon discharge. Patient has agreed to participate in recommended program. Potentially Note that insurance prior  authorization may be required for reimbursement for recommended care.   Comment: Rehab Admissions Coordinator to follow up.     I have personally performed a face to face diagnostic evaluation, including, but not limited to relevant history and physical exam findings, of this patient and developed relevant assessment and plan.  Additionally, I have reviewed and concur with the physician assistant's documentation above.    Delice Lesch, MD, ABPMR Bary Leriche, PA-C 06/03/2019        Revision History                     Routing History

## 2019-06-11 NOTE — Progress Notes (Signed)
Courtney Heys, MD  Physician  Physical Medicine and Rehabilitation  PMR Pre-admission  Signed  Date of Service:  06/07/2019 10:59 AM      Related encounter: Admission (Discharged) from 04/15/2019 in [place]       PMR Admission Coordinator Pre-Admission Assessment   Patient: [name] is an 63 y.o., female MRN: [id number] DOB: [birth year] Height: _0  (167.6 cm) Weight: 107.5 kg                                                                                                                                                  Insurance Information HMO:     PPO: yes     PCP:      IPA:      80/20:      OTHER:  PRIMARY: [employer] [place]      Policy#: [id number]      Subscriber: Patient CM Name: Santiago Glad      Phone#: [phone]     Fax#: [phone] Pre-Cert#: [id number]      Employer:  Josem Kaufmann provided by Santiago Glad for admit to [facility] 10/3. Pt is approved for 7 days.Follow up CM is Lornette (p): [phone] (f): [phone]. **CM is requesting evaluations be faxed by Monday (10/5) for updates.  Benefits:  Phone #: [phone]     Name: (have spoken to Prudencio Burly) ref [id number] Eff. Date: 09/08/2018 - still active     Deduct: $3,000 ($3,000 met)      Out of Pocket Max: $5,000 [phone] met)      Life Max: NA *CIR: $500/admission co-pay, then 100% after deductible; limited to 60 days       SNF: $500/admission co-pay, then 100% after deductible; limited to 60 days  Outpatient: limited to 30 visits per cal yr combined PT/OT/ST      Co-Pay: $30/visit co-pay Home Health: limited to 40 visits per cal year      Co-Pay: $30/visit per co-pay DME: coverage depends on item, but generally is 50% coverage, 50% co-insurance   Providers:    *multiple conversations with RN Case Managers/customer service reps from [employer] company regarding insurance benefits. Santiago Glad [facility]) (p): [phone] who referenced initial benefits check conversation through  customer service ref #: [id number], Gershon Mussel [facility] supervisor) (p) [phone], and Prudencio Burly. (customer service): reference #: [id number] that indicate that the pt has separate inpatient hospital benefits from inpatient rehab benefits and that no inpatient rehab benefits have been used to date. Pt's sister Margaretha Sheffield) aware of multiple conversations and is aware of initial discrepancy in benefits information. Margaretha Sheffield has been in contact with Gershon Mussel [facility]) and have confirmed the patient has inpatient rehab benefits available. Margaretha Sheffield is aware that if the patient does not have inpatient rehab benefits at this time, that the patient will be responsible for  her rehab stay without insurance coverage.   SECONDARY:  None     Policy#:       Subscriber:  CM Name:       Phone#:      Fax#:  Pre-Cert#:       Employer:  Benefits:  Phone #:      Name:  Eff. Date:      Deduct:       Out of Pocket Max:       Life Max:  CIR:       SNF:  Outpatient:      Co-Pay:  Home Health:       Co-Pay:  DME:      Co-Pay:    Medicaid Application Date:       Case Manager:  Disability Application Date:       Case Worker:    The "Data Collection Information Summary" for patients in Inpatient Rehabilitation Facilities with attached "Privacy Act [place] Records" was provided and verbally reviewed with: N/A   Emergency Contact Information         Contact Information     Name Relation Home Work Colver Daughter     [phone]    Currie,Ben Brother     [phone]    haynie,elanie Sister     [phone]      Current Medical History  Patient Admitting Diagnosis: Ventriculitis with hydrocephalus   History of Present Illness: [name] is a 63 y.o. 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  History  taken from chart review due to cognition.  She was admitted on 04/15/2019 for repeat endoscopic transphenoidal resection of tumor by Drs. Ostergard and [employer]. Post op course complicated by fevers, confusion, cognitive deficits, visual disturbance within the right temporal field, nasal drainage, mobility.  She was taken to OR for lumbar drain placement on 04/24/2019.  Follow-up head CT on 04/27/2019 showed large pneumocephalus with mass-effect on the right frontal lobe and diffuse cerebral sulcal effacement as well as posterior displacement of brainstem.  This was thought to be secondary to train and therefore drain was clamped. She was taken back to OR on 04/29/2019 for repair of CSF leak with abdominal wound revision. On 05/04/2019 she developed recurrent fever with tachycardia. Was monitored on empiric antibiotics, Lumbar drain clamped as drainage resolved but on 8/31, she had recurrent drainage therefore placed on bedrest. On 09/06, she developed hypoxia with fever and tachycardia with decline in mental status. CSF culture showed 830 WBC with CSF protein -488.  ID consulted for input and she was started on Vanc/Cefepime for bacterial meningitis.   She continued to have fevers and antibiotic coverage broadened. MRI brain revealing small volume infectious debris layering in lateral and 4th ventricle and moderate to severe paranasal sinus with superimposed moderate to severe paranasal sinus mucosal thickening. CSF cultures and blood cultures negative and ID question ENT evaluation of sinuses as well as shunt revision. Dr. Zada Finders felt that changes due to ventriculitis. Patient developed lethargy with attempts at LD clamping on 05/25/2019 therefore drain unclamped. She continued to have decline in MS with CT head showing concerns of hydrocephalus. Antibiotics discontinued on 9/18 as she was afebrile X 72 hours.  She was taken to the OR again on 05/31/2023 removal of lumbar drain and placement of  VPS.  She tolerated  extubation without difficulty and therapy evaluations completed yesterday revealing significant cognitive deficits, apraxia with ADL tasks, difficulty initiating RLE with mobility. CIR recommended due to functional decline.  Pt's mental status continues to improve. Pt is to be admitted to CIR on 06/11/2019.       Glasgow Coma Scale Score: 15   Past Medical History      Past Medical History:  Diagnosis Date  . Anemia    . Breast cancer ([place]) 12/29/13    right 6:00 o'clock, lower outer  . Cataract of left eye    . Diabetes mellitus      type 2  . Family history of malignant neoplasm of breast    . Glaucoma      MD just watching, no eye drops  . High cholesterol    . History of blood transfusion 2015    with chemo treatments  . History of radiation therapy 08/12/14- 10/11/14    right breast /50.4 Gy/28 fx, right breast boost/ 10 Gy/ 5 fx  . HTN (hypertension)    . Personal history of chemotherapy    . Personal history of radiation therapy    . Pituitary adenoma ([place]) 10/30/2018  . Wears dentures      full top-partial bottom  . Wears dentures      full  . Wears glasses    . Wears glasses       Family History  family history includes Breast cancer in her maternal aunt and mother; Cancer in her maternal uncle and maternal uncle; Diabetes type II in her mother; Hypertension in her father and mother; Uterine cancer (age of onset: 67) in her cousin.   Prior Rehab/Hospitalizations:  Has the patient had prior rehab or hospitalizations prior to admission? Yes   Has the patient had major surgery during 100 days prior to admission? Yes   Current Medications   Current Facility-Administered Medications:  .  0.9 %  sodium chloride infusion, , Intravenous, PRN, Judith Part, MD, Stopped at 05/25/19 1657 .  0.9 %  sodium chloride infusion, , Intravenous, Continuous, Ostergard, Joyice Faster, MD, Stopped at 06/01/19 [phone] .  acetaminophen (TYLENOL) tablet 650 mg, 650 mg, Oral, Q4H PRN, 650 mg  at 05/24/19 2205 **OR** acetaminophen (TYLENOL) suppository 650 mg, 650 mg, Rectal, Q4H PRN, Judith Part, MD, 650 mg at 05/16/19 0746 .  alum & mag hydroxide-simeth (MAALOX/MYLANTA) 200-200-20 MG/5ML suspension 30 mL, 30 mL, Oral, Q6H PRN, Judith Part, MD, 30 mL at 05/05/19 1523 .  amLODipine (NORVASC) tablet 10 mg, 10 mg, Oral, Daily, Judith Part, MD, 10 mg at 06/10/19 0914 .  atorvastatin (LIPITOR) tablet 40 mg, 40 mg, Oral, Daily, Judith Part, MD, 40 mg at 06/10/19 1731 .  docusate sodium (COLACE) capsule 100 mg, 100 mg, Oral, BID, Judith Part, MD, 100 mg at 06/10/19 2131 .  feeding supplement (BOOST / RESOURCE BREEZE) liquid 1 Container, 1 Container, Oral, TID BM, Judith Part, MD, 1 Container at 06/10/19 1442 .  feeding supplement (ENSURE ENLIVE) (ENSURE ENLIVE) liquid 237 mL, 237 mL, Oral, TID BM, Judith Part, MD, 237 mL at 06/10/19 2023 .  gabapentin (NEURONTIN) capsule 300 mg, 300 mg, Oral, BID, Judith Part, MD, 300 mg at 06/10/19 2131 .  Gerhardt's butt cream, , Topical, BID, Ostergard, Thomas A, MD .  heparin injection 5,000 Units, 5,000 Units, Subcutaneous, Q8H, Judith Part, MD, 5,000 Units at 06/10/19 2132 .  HYDROcodone-acetaminophen (NORCO/VICODIN)  5-325 MG per tablet 1 tablet, 1 tablet, Oral, Q4H PRN, Judith Part, MD, 1 tablet at 06/01/19 2041 .  HYDROmorphone (DILAUDID) injection 0.5 mg, 0.5 mg, Intravenous, Q3H PRN, Judith Part, MD, 0.5 mg at 04/26/19 0939 .  insulin aspart (novoLOG) injection 0-20 Units, 0-20 Units, Subcutaneous, TID WC, Ostergard, Joyice Faster, MD, 7 Units at 06/10/19 1730 .  insulin aspart (novoLOG) injection 0-5 Units, 0-5 Units, Subcutaneous, QHS, Judith Part, MD, 2 Units at 06/08/19 2205 .  iron polysaccharides (NIFEREX) capsule 150 mg, 150 mg, Oral, BID, Ostergard, Thomas A, MD, 150 mg at 06/10/19 2135 .  labetalol (NORMODYNE) injection 10-40 mg, 10-40 mg, Intravenous,  Q10 min PRN, Judith Part, MD, 20 mg at 05/20/19 0310 .  lidocaine (PF) (XYLOCAINE) 1 % injection, , , PRN, Jacqulynn Cadet, MD, 10 mL at 04/19/19 1436 .  lip balm (CARMEX) ointment, , Topical, PRN, Judith Part, MD .  ondansetron (ZOFRAN) tablet 4 mg, 4 mg, Oral, Q4H PRN, 4 mg at 04/30/19 1043 **OR** ondansetron (ZOFRAN) injection 4 mg, 4 mg, Intravenous, Q4H PRN, Judith Part, MD, 4 mg at 05/16/19 0910 .  pantoprazole (PROTONIX) EC tablet 40 mg, 40 mg, Oral, Daily, Judith Part, MD, 40 mg at 06/10/19 0914 .  polyethylene glycol (MIRALAX / GLYCOLAX) packet 17 g, 17 g, Oral, Daily PRN, Judith Part, MD, 17 g at 06/05/19 0915 .  promethazine (PHENERGAN) injection 12.5-25 mg, 12.5-25 mg, Intravenous, Q4H PRN, Judith Part, MD, 25 mg at 05/15/19 2239 .  promethazine (PHENERGAN) tablet 12.5-25 mg, 12.5-25 mg, Oral, Q4H PRN, Judith Part, MD .  senna (SENOKOT) tablet 8.6 mg, 1 tablet, Oral, BID, Judith Part, MD, 8.6 mg at 06/10/19 2131 .  sodium chloride (OCEAN) 0.65 % nasal spray 4 spray, 4 spray, Each Nare, PRN, Ostergard, Thomas A, MD .  tiZANidine (ZANAFLEX) tablet 4 mg, 4 mg, Oral, Q8H PRN, Judith Part, MD, 4 mg at 04/20/19 1554   Patients Current Diet:     Diet Order                      Diet Carb Modified Fluid consistency: Thin; Room service appropriate? 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  Independent   Indoor  Mobility: Did the patient need assistance with walking from room to room (with or without device)? Independent   Stairs: Did the patient need assistance with internal or external stairs (with or without device)? Independent   Functional Cognition: Did the patient need help planning regular tasks such as shopping or remembering to take medications? Independent   Home Assistive Devices / Equipment Home Assistive Devices/Equipment: CBG Meter, Eyeglasses, Hand-held shower hose, Dentures (specify type) Home Equipment: None   Prior Device Use: Indicate devices/aids used by the patient prior to current illness, exacerbation or injury? None of the above   Current Functional Level Cognition  Overall Cognitive Status: Impaired/Different from baseline Current Attention Level: Sustained Orientation Level: Oriented to person, Oriented to place, Oriented to situation, Disoriented to time Following Commands: Follows one step commands with increased time Safety/Judgement: Decreased awareness of safety, Decreased awareness of deficits General Comments: When asked what day of the week it is, pt responds with "1, no 11." Decreased short term memory recall about day's events, decreased attention span. States her daughter is outside the room (which she was not).    Extremity Assessment (includes Sensation/Coordination)   Upper Extremity Assessment: Generalized weakness  Lower Extremity Assessment: Defer to PT evaluation     ADLs   Overall ADL's : Needs assistance/impaired Eating/Feeding: Minimal assistance, Cueing for sequencing, Sitting Eating/Feeding Details (indicate cue type and reason): Pt required cues to cut food before eating and to open ice cream before attempting to eat it.   Grooming: Min guard, Cueing for sequencing, Wash/dry face, Oral care, Standing, Cueing for safety Grooming Details (indicate cue type and reason): Pt min guard for standing grooming at sink with RW; pt requires MIN cues for  safety/ sequencing of task as pt asking if her toothpaste with vaseline Upper Body Bathing: Moderate assistance, Sitting, Cueing for sequencing Lower Body Bathing: Maximal assistance, Sit to/from stand, Cueing for compensatory techniques, Cueing for sequencing Lower Body Bathing Details (indicate cue type and reason): Pt fatigues quickly. Requires cues to continue the task and stay focused.   Upper Body Dressing : Moderate assistance, Sitting Lower Body Dressing: Maximal assistance, Sit to/from stand, Bed level Lower Body Dressing Details (indicate cue type and reason): RN provided max A to help pt don briefs in bed above knees. Pt moved EOB and performed sit<> stand with mod A to power up and maintain balance. Pt pulled up briefs over hips, required A to pull all the way up Toilet Transfer: Ambulation, RW, Min guard, Cueing for safety, Cueing for sequencing Toilet Transfer Details (indicate cue type and reason): simulated from recliner> EOB; Min guard for safety; cues for hand placement on RW; cues for sequencing of task as pt trying to walk away w/o RW Toileting- Clothing Manipulation and Hygiene: Total assistance, Sit to/from stand Toileting - Clothing Manipulation Details (indicate cue type and reason): Therapist assists pt with standing while second person manages clothing. Functional mobility during ADLs: Rolling walker, Min guard General ADL Comments: MIN A- MIN guard for ADLs; cues for safety during functional mobility; cues for sequencing of task for standing ADLs     Mobility   Overal bed mobility: Needs Assistance Bed Mobility: Sit to Supine Rolling: Mod assist Sidelying to sit: Mod assist Supine to sit: Min guard Sit to supine: Min assist General bed mobility comments: Increased time     Transfers   Overall transfer level: Needs assistance Equipment used: Rolling walker (2 wheeled) Transfers: Sit to/from Stand Sit to Stand: Min assist, Mod assist Stand pivot transfers: Mod  assist General transfer comment: MinA to rise from edge of bed, modA from toilet     Ambulation / Gait / Stairs / Wheelchair Mobility   Ambulation/Gait Ambulation/Gait assistance: Counsellor (Feet): 15 Feet Assistive device: Rolling walker (2 wheeled) Gait Pattern/deviations: Step-through pattern, Decreased stride length General Gait Details: Slow speed, bilateral knee instability, increased lateral sway and trunk flexion Gait velocity: decreased Gait velocity interpretation: <1.8 ft/sec, indicate of risk for recurrent falls     Posture / Balance Dynamic Sitting Balance Sitting balance - Comments: able to sit EOB with S Balance Overall balance assessment:  Needs assistance Sitting-balance support: Feet supported, No upper extremity supported Sitting balance-Leahy Scale: Fair Sitting balance - Comments: able to sit EOB with S Standing balance support: During functional activity Standing balance-Leahy Scale: Poor Standing balance comment: posterior LOB with washing at sink     Special needs/care consideration BiPAP/CPAP: no CPM: no Continuous Drip IV: 0.9% sodium chloride infusion  Dialysis: no        Days: no Life Vest: no Oxygen: on RA Special Bed: no Trach Size: no Wound Vac (area): no      Location: no Skin: area on right side of abdomen dehisced (surgical incision), back incision ; MASD to right and left upper thigh, closed head incision Bowel mgmt: last BM: 06/09/2019, incontinent  Bladder mgmt: incontinent, external urinary catheter Diabetic mgmt: yes Behavioral consideration : no Chemo/radiation : not currently (has had history prior but not within a least a year)        Previous Home Environment (from acute therapy documentation) Living Arrangements: Alone Available Help at Discharge: Family, Available PRN/intermittently Type of Home: House Home Layout: One level Home Access: Level entry Bathroom Shower/Tub: Chiropodist:  Standard Home Care Services: No Additional Comments: 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  Totally independent.     Discharge Living Setting Plans for Discharge Living Setting: Other (Comment)(will have family 24/7 for first few weeks at DC (Dad's home)) Type of Home at Discharge: House Discharge Home Layout: One level Discharge Home Access: Stairs to enter Entrance Stairs-Rails: Can reach both Entrance Stairs-Number of Steps: 3 Discharge Bathroom Shower/Tub: Walk-in shower Discharge Bathroom Toilet: Standard Discharge Bathroom Accessibility: Yes How Accessible: Accessible via wheelchair, Accessible via walker Does the patient have any problems obtaining your medications?: No   Social/Family/Support Systems Patient Roles: Other (Comment)(has many siblings, daughter, 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: sister Margaretha Sheffield: [phone]; Daughter [name]: [phone] *Sister has been main contact regarding rehab info and [place] support Anticipated Caregiver: sister, brother, daughter, father (all plan to take shifts with pt staying at her father's house as it is more accessible) Anticipated Caregiver's Contact Information: see above Ability/Limitations of Caregiver: supervision Caregiver Availability: 24/7 Discharge Plan Discussed with Primary Caregiver: Yes(pt and sister Margaretha Sheffield)) Is Caregiver In Agreement with Plan?: Yes Does Caregiver/Family have Issues with Lodging/Transportation while Pt is in Rehab?: No     Goals/Additional Needs Patient/Family Goal for Rehab: PT/OT/SLP: Supervision Expected length of stay: 10-14 days Cultural Considerations: NA  Dietary Needs: carb modified, thin liquids; calorie level: Medium 1600-2000 Equipment Needs: TBD Pt/Family Agrees to Admission and willing to participate: Yes Program Orientation Provided & Reviewed with Pt/Caregiver Including Roles  & Responsibilities: Yes(pt and sister Margaretha Sheffield)  Barriers to Discharge: Home environment access/layout,  Insurance for SNF coverage  Barriers to Discharge Comments: stairs to enter     Decrease burden of Care through IP rehab admission: NA     Possible need for SNF placement upon discharge:Not anticipated. Family aware pt must return home after CIR stay and they have established a plan for 24/7 support at home. Pt will DC to her father's house as it is more accessible with a bathroom that can support a wc if need be.      Patient Condition: This patient's medical and functional status has changed since the consult dated 06/03/2019 in which the Rehabilitation Physician determined and documented that the patient was potentially appropriate for intensive rehabilitative care in an inpatient rehabilitation facility. Pt is now more alert and participatory with therapies as she is now  beginning to work on gait and participating in conversations with [facility]. Family support has been confirmed at [place] with family arranging for 24/7 support between family members. Issues have been addressed and update has been discussed with Dr. Dagoberto Ligas and patient now appropriate for inpatient rehabilitation. Will admit to inpatient rehab 06/11/2019   Preadmission Screen Completed By:  Raechel Ache, OT, 06/10/2019 9:43 PM ______________________________________________________________________   Discussed status with Dr. Dagoberto Ligas on 06/10/2019 at 5:00PM and received approval for admission today.   Admission Coordinator:  Raechel Ache, time 9:43PM/Date 06/10/2019         Revision History Date/Time User Provider Type Action  06/11/2019 10:59 AM Courtney Heys, MD Physician Sign  06/11/2019  8:58 AM Raechel Ache, OT Rehab Admission Coordinator Share  06/11/2019  8:49 AM Raechel Ache, OT Rehab Admission Coordinator Share  06/11/2019  8:46 AM Raechel Ache, OT Rehab Admission Coordinator Share  06/10/2019 10:25 PM Raechel Ache, OT Rehab Admission Coordinator Share  06/10/2019  9:56 PM Raechel Ache, [place] Rehab Admission Coordinator Share   06/10/2019  9:48 PM Raechel Ache, [place] Rehab Admission Coordinator Share   View Details Report

## 2019-06-12 ENCOUNTER — Inpatient Hospital Stay (HOSPITAL_COMMUNITY): Payer: BC Managed Care – PPO | Admitting: Occupational Therapy

## 2019-06-12 ENCOUNTER — Inpatient Hospital Stay (HOSPITAL_COMMUNITY): Payer: BC Managed Care – PPO

## 2019-06-12 LAB — GLUCOSE, CAPILLARY
Glucose-Capillary: 188 mg/dL — ABNORMAL HIGH (ref 70–99)
Glucose-Capillary: 209 mg/dL — ABNORMAL HIGH (ref 70–99)
Glucose-Capillary: 179 mg/dL — ABNORMAL HIGH (ref 70–99)
Glucose-Capillary: 149 mg/dL — ABNORMAL HIGH (ref 70–99)

## 2019-06-12 MED ORDER — LIP MEDEX EX OINT: TOPICAL_OINTMENT | CUTANEOUS | Status: DC | PRN

## 2019-06-12 NOTE — Evaluation (Signed)
Physical Therapy Assessment and Plan  Patient Details  Name: [name] MRN: [id number] Date of Birth: 04/28/1956  PT Diagnosis: Difficulty walking, Impaired cognition and Muscle weakness Rehab Potential: Good ELOS: 7-9 days   Today's Date: 06/12/2019 PT Individual Time: 1102-1200 PT Individual Time Calculation (min): 58 min    Problem List:  Patient Active Problem List   Diagnosis Date Noted  . 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 S/P VP shunt   . Diabetes mellitus type 2 in obese ([place])   . Acute blood loss anemia   . Elevated BUN 05/12/2019  . Elevated serum creatinine 05/12/2019  . Pituitary tumor 05/12/2019  . Vitamin D deficiency 05/12/2019  . DJD (degenerative joint disease) 05/12/2019  . 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 Febrile illness   . AKI (acute kidney injury) ([place]) 10/30/2018  . Pituitary adenoma ([place]) 10/30/2018  . Acute bacterial meningitis 10/30/2018  . Sepsis ([place]) 10/30/2018  . Headache 10/29/2018  . Acute encephalopathy 10/29/2018  . Rhabdomyolysis 10/29/2018  . History of pituitary adenoma 10/29/2018  . Glaucoma 10/29/2018  . Atherosclerosis 10/29/2018  . Acute purulent meningitis 10/29/2018  . CKD (chronic kidney disease), stage III 10/29/2018  . Meningitis 10/29/2018  . Fever 10/29/2018  . Malignant neoplasm of lower-outer quadrant of right breast of female, estrogen receptor negative ([place]) 06/09/2016  . Morbid obesity with BMI of 40.0-44.9, adult ([place]) 05/25/2015  . Type II diabetes mellitus, uncontrolled ([place]) 05/25/2015  . Normocytic anemia 04/27/2014  . Hyperlipidemia, unspecified 10/22/2011  . Essential hypertension with goal blood pressure less than 130/85 10/22/2011  . DM (diabetes mellitus) ([place]) 10/22/2011  . HTN (hypertension)   . Diabetes mellitus type II, non insulin dependent ([place])   . Dyslipidemia     Past Medical History:  Past Medical History:   Diagnosis Date  . Anemia   . Breast cancer ([place]) 12/29/13   right 6:00 o'clock, lower outer  . Cataract of left eye   . Diabetes mellitus    type 2  . Family history of malignant neoplasm of breast   . Glaucoma    MD just watching, no eye drops  . High cholesterol   . History of blood transfusion 2015   with chemo treatments  . History of radiation therapy 08/12/14- 10/11/14   right breast /50.4 Gy/28 fx, right breast boost/ 10 Gy/ 5 fx  . HTN (hypertension)   . Personal history of chemotherapy   . Personal history of radiation therapy   . Pituitary adenoma ([place]) 10/30/2018  . Wears dentures    full top-partial bottom  . Wears dentures    full  . Wears glasses   . Wears glasses    Past Surgical History:  Past Surgical History:  Procedure Laterality Date  . ABDOMINAL HYSTERECTOMY  1993   endometriosis  . BREAST BIOPSY Left 12/29/2013  . BREAST LUMPECTOMY Right 02/08/2014  . BREAST LUMPECTOMY WITH AXILLARY LYMPH NODE BIOPSY Right 02/08/14   invasive ductal  . CRANIOTOMY N/A 04/15/2019   Procedure: Transphenoid resection of pituitary tumor with fat graft;  Surgeon: Judith Part, MD;  Location: [facility];  Service: Neurosurgery;  Laterality: N/A;  . IR IMAGE GUIDED FLUID DRAIN BY CATHETER  04/19/2019  . MULTIPLE TOOTH EXTRACTIONS    . PLACEMENT OF LUMBAR DRAIN N/A 04/24/2019   Procedure: PLACEMENT OF LUMBAR DRAIN;  Surgeon: Eustace Moore, MD;  Location: [facility];  Service: Neurosurgery;  Laterality: N/A;  .  PLACEMENT OF LUMBAR DRAIN N/A 05/16/2019   Procedure: PLACEMENT OF LUMBAR DRAIN;  Surgeon: Newman Pies, MD;  Location: [facility];  Service: Neurosurgery;  Laterality: N/A;  PLACEMENT OF LUMBAR DRAIN  . PORT-A-CATH REMOVAL    . PORTACATH PLACEMENT Left 02/08/2014   Procedure: INSERTION PORT-A-CATH;  Surgeon: Rolm Bookbinder, MD;  Location: [facility];  Service: General;  Laterality: Left;  . REPAIR OF CEREBROSPINAL FLUID LEAK N/A 04/29/2019   Procedure: Endoscopic  Endonasal Cerebrospinal Fluid Leak repair, Abdominal wound revision and fat graft harvest, Intrathecal Fluorocein Injection;  Surgeon: Judith Part, MD;  Location: [facility];  Service: Neurosurgery;  Laterality: N/A;  . TONSILLECTOMY    . TRANSNASAL APPROACH N/A 04/15/2019   Procedure: TRANSNASAL APPROACH;  Surgeon: Jerrell Belfast, MD;  Location: [facility];  Service: ENT;  Laterality: N/A;  . TRANSPHENOIDAL APPROACH EXPOSURE N/A 04/15/2019   Procedure: ENDOSCOPIC TRANS NASAL APPROACH WITH FUSIONN;  Surgeon: Jerrell Belfast, MD;  Location: [facility];  Service: ENT;  Laterality: N/A;  . TRANSPHENOIDAL APPROACH EXPOSURE N/A 04/29/2019   Procedure: Endoscopic repair of spinal fluid leak ;  Surgeon: Jerrell Belfast, MD;  Location: [facility];  Service: ENT;  Laterality: N/A;  . TRANSPHENOIDAL PITUITARY RESECTION  2006  . VENTRICULOPERITONEAL SHUNT Right 05/31/2019   Procedure: SHUNT INSERTION VENTRICULAR-PERITONEAL; REMOVAL OF LUMBAR DRAIN;  Surgeon: Judith Part, MD;  Location: [facility];  Service: Neurosurgery;  Laterality: Right;  SHUNT INSERTION VENTRICULAR-PERITONEAL; REMOVAL OF LUMBAR DRAIN  . WISDOM TOOTH EXTRACTION      Assessment & Plan Clinical Impression: Patient is a 63 y.o. 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 She was admitted on 04/15/19 for repeat endoscopic transphenoidal resection of tumor by Drs. Ostegard and [employer]. Post op course complicate by fevers with confusion, cognitive deficits and visual changes. She also had nasal drainage with activity concerning for leak therefore taken to OR for placement of lumbar drain on 04/24/19.  Follow up CT head on 08/19 showed large pneumocephalus with mass-effect on right frontal lobe and diffuse cerebral sulcal effacement as well as posterior displacement  of brainstem.  This was thought to be secondary to drain therefore drain was clamped and she was taken back to the OR on 8/21 for repair of CSF leak with abdominal wound revision.  On 8/26, she developed recurrent fever with tachycardia and was started on empiric antibiotics.  She had recurrent nasal drainage with clear attempts at clamping lumbar drain therefore was placed on bedrest.  On 09/06, she developed hypoxia with fever and tachycardia and declining mental status.  CSF smear showed 830 WBCs with protein elevated at 448.  ID consulted for input and she was started on Vanco and cefepime for bacterial meningitis.  She continued to have fevers and antibiotics caused brought in.  CSF cultures and blood cultures were negative and INT recommended ENT evaluation as well as shunt revision.  Dr. Venetia Constable felt that changes were due to ventriculitis and recommended monitoring.  She developed lethargy with attempts at lumbar drain clamping..  She continued to have decline in mental status with CT of head showing concerns of hydrocephalus.  Antibiotics was discontinued on zero 9/18 as she had been afebrile x24 hours.  She was taken to the OR on 922 for removal of lumbar drain and placement of VP shunt.  She tolerated  extubation without difficulty and mentation has been stable.  No recurrent fevers noted.  Follow-up CT head showed continued enlargement of ventricles therefore shunt was dialed down to 2.0 on 09/25.  Therapy resumed and patient is making progress with therapy.  She continues have balance deficits with shuffling, requires cues for sequencing with task as well as some dizziness with activity.  CIR recommended due to functional decline.  Patient transferred to CIR on 06/11/2019 .   Patient currently requires min with mobility secondary to muscle weakness, decreased coordination, decreased memory and decreased standing balance and decreased balance strategies.  Prior to hospitalization, patient was  independent  with mobility and lived with Alone in a House home.  Home access is 5 STEStairs to enter.  Patient will benefit from skilled PT intervention to maximize safe functional mobility, minimize fall risk and decrease caregiver burden for planned discharge home with 24 hour supervision.  Anticipate patient will benefit from follow up [place] at discharge.     Skilled Therapeutic Intervention Evaluation completed (see details above and below) with education on PT POC and goals and individual treatment initiated with focus on functional mobility, balance, transfers and gait. Pt supine in bed upon PT arrival, agreeable to therapy tx and denies pain. Pt transferred to sitting EOB with supervision. Pt performed sit<>stand with min assist, stand pivot to w/c with min assist and transported to the gym. Pt amulated x 80 ft within the gym with RW and min assist, decreased step length bilaterally and slow gait speed, pt limited secondary to fatigue and decreased endurance. Pt ascended/descended 4 steps this session with B rails and min assist, weakness noted in L LE as pt has harder time leading with L LE, cues for techniques and safety. Pt ambulated back to w/c with RW and min assist x 60 ft. Pt transported to ortho gym in w/c. Pt performed stand pivot car transfer with min assist, cues for techniques. Therapist provided pt with cushion for w/c to limit skin breakdown. Pt transported back to room and left seated in w/c with needs in reach and chair alarm set.   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 Leisure: Hobbies-yes (Comment) Comments: enjoys singing at [employer] Overall Cognitive Status: Within Functional Limits for tasks assessed Arousal/Alertness: Awake/alert Orientation Level: Oriented to person;Oriented to place(states it is november, unable to elaborate on why she is in the hospital) Attention: Sustained Focused Attention: Appears intact Sustained Attention: Appears intact Memory: Impaired Memory Impairment: Decreased recall of new information Immediate Memory Recall: Sock;Blue;Bed Memory Recall Sock: With Cue Memory Recall Blue: With Cue Memory Recall Bed: With Cue Awareness: Appears intact Problem Solving: Appears intact Safety/Judgment: Appears intact Sensation Sensation Light Touch: Appears Intact Proprioception: Appears Intact Additional Comments: sensation WFL B LEs Coordination Gross Motor Movements are Fluid and Coordinated: No Fine Motor Movements are Fluid and Coordinated: No Coordination and Movement Description: coordination impaired secondary to generalized weakness, slow movements Motor  Motor Motor - Skilled Clinical Observations: generalized weakness  Mobility Bed Mobility Bed  Mobility: Supine to Sit;Sit to Supine Supine to Sit: Supervision/Verbal cueing Sit to Supine: Supervision/Verbal cueing Transfers Transfers: Sit to Stand;Stand to Sit;Stand Pivot Transfers Sit to Stand: Minimal Assistance - Patient > 75% Stand to Sit: Minimal Assistance - Patient > 75% Stand Pivot Transfers: Minimal Assistance - Patient > 75% Stand Pivot Transfer Details: Verbal cues for technique;Verbal cues for safe use of DME/AE;Verbal cues for precautions/safety Transfer (Assistive device): None Locomotion  Gait Ambulation: Yes Gait Assistance:  Minimal Assistance - Patient > 75% Gait Distance (Feet): 80 Feet Assistive device: Rolling walker Gait Assistance Details: Verbal cues for technique;Verbal cues for safe use of DME/AE;Verbal cues for precautions/safety Gait Gait: Yes Gait Pattern: Impaired Gait Pattern: Decreased step length - right;Decreased step length - left Gait velocity: decreased Stairs / Additional Locomotion Stairs: Yes Stairs Assistance: Minimal Assistance - Patient > 75% Stair Management Technique: Two rails Number of Stairs: 4 Height of Stairs: 6 Wheelchair Mobility Wheelchair Mobility: No  Trunk/Postural Assessment  Cervical Assessment Cervical Assessment: Within Functional Limits Thoracic Assessment Thoracic Assessment: Within Functional Limits Lumbar Assessment Lumbar Assessment: Within Functional Limits Postural Control Postural Control: Within Functional Limits  Balance Balance Balance Assessed: Yes Static Sitting Balance Static Sitting - Level of Assistance: 5: Stand by assistance Dynamic Sitting Balance Dynamic Sitting - Level of Assistance: 5: Stand by assistance Static Standing Balance Static Standing - Level of Assistance: 5: Stand by assistance;4: Min assist Dynamic Standing Balance Dynamic Standing - Level of Assistance: 4: Min assist Extremity Assessment  RLE Assessment Passive Range of Motion (PROM) Comments: WFL General Strength Comments: generalized weakness, grossly 4/5 throughout LLE Assessment Passive Range of Motion (PROM) Comments: WFL General Strength Comments: generalized weakness, grossly 3-/5 to 4/5 throughout. L LE weakness seen functionally when ascending steps    Refer to Care Plan for Long Term Goals  Recommendations for other services: None   Discharge Criteria: Patient will be discharged from PT if patient refuses treatment 3 consecutive times without medical reason, if treatment goals not met, if there is a change in medical status, if patient makes no  progress towards goals or if patient is discharged from hospital.  The above assessment, treatment plan, treatment alternatives and goals were discussed and mutually agreed upon: by patient   Netta Corrigan, PT, DPT, CSRS 06/12/19  11:29 AM

## 2019-06-12 NOTE — Progress Notes (Signed)
[facility] PHYSICAL MEDICINE & REHABILITATION PROGRESS NOTE   Subjective/Complaints: Pt reports has worked with PT and OT already today- has another appointment at 11am today   Also, can't find lip balm- wants some- lips are dry.   ROS-  Pt denies any SOB, CP, N/V/D  Objective:   No results found. No results for input(s): WBC, HGB, HCT, PLT in the last 72 hours. Recent Labs    06/10/19 0858  NA 135  K 3.8  CL 102  CO2 22  GLUCOSE 200*  BUN 11  CREATININE 1.08*  CALCIUM 9.3    Intake/Output Summary (Last 24 hours) at 06/12/2019 1537 Last data filed at 06/12/2019 1300 Gross per 24 hour  Intake 1561.74 ml  Output -  Net 1561.74 ml     Physical Exam: Vital Signs Blood pressure 127/72, pulse 90, temperature 98.3 F (36.8 C), temperature source Oral, resp. rate 18, height 5\' 6"  (1.676 m), weight 99.5 kg, SpO2 100 %.  Nursing note and vitals reviewed.  Constitutional: she's awake, alert, vague, but answering questions; amost sing-song in her speech, NAD Hair shaved on R side for craniotomy site- incision healing-no drainage or erythema  HENT:  Head: Normocephalic and atraumatic.  Nose: Nose normal.  Mouth/Throat:lips dry/cracked Right crani incision C/D/I and healing well L facial droop notable with smile R tongue deviation also noted  EOMs intact- has no nystagmus seen; Impaired/blurry vision-   Neck: Normal range of motion. Neck supple.  Cardiovascular: Normal rate and regular rhythm. Respiratory: Effort normal and breath sounds normal. GI: Soft. Bowel sounds are normal. She exhibits no distension. There is no abdominal tenderness.  RLQ wound with healthy beefy red tissue with minimal drainage. - packed Also has epigastric horizontal incision- glued? And appears to be healing well  Musculoskeletal: Normal range of motion.  Comments: Strength UEs- 4+ to 5-/5 in UEs- slightly weaker on LUE LEs- 4+ to 5-/5- slightly weaker on LLE Tested  deltoid/biceps/triceps/WE/grip and finger abd Tested HF/KE/KF/DF/PF  Neurological: She is alert, Ox3, but no spontaneous responses Intact sensation to light touch in all 4 extremities Skin: Skin is warm and dry. She is not diaphoretic.  allevyn on lumbar midline area- could not look underneath due to pt positioning.  Psychiatric: She has a normal mood and affect. Her behavior is normal.  Slightly vague/confused/sing-song    Assessment/Plan: 1. 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  Physiatrist is providing close team supervision and 24 hour management of active medical problems listed below.  Physiatrist and rehab team continue to assess barriers to discharge/monitor patient progress toward functional and medical goals  Care Tool:  Bathing    Body parts bathed by patient: Right arm, Left arm, Chest, Abdomen, Right upper leg, Left upper leg, Right lower leg, Left lower leg, Face     Body parts n/a: Front perineal area, Buttocks   Bathing assist Assist Level: Minimal Assistance - Patient > 75%     Upper Body Dressing/Undressing Upper body dressing   What is the patient wearing?: Bra, Pull over shirt    Upper body assist Assist Level: Supervision/Verbal cueing    Lower Body Dressing/Undressing Lower body dressing      What is the patient wearing?: Incontinence brief, Pants     Lower body assist Assist for lower body dressing: Moderate Assistance - Patient 50 - 74%  Toileting Toileting    Toileting assist Assist for toileting: Maximal Assistance - Patient 25 - 49%     Transfers Chair/bed transfer  Transfers assist     Chair/bed transfer assist level: Minimal Assistance - Patient > 75%     Locomotion Ambulation   Ambulation assist      Assist  level: Minimal Assistance - Patient > 75% Assistive device: Walker-rolling Max distance: 80 ft   Walk 10 feet activity   Assist     Assist level: Minimal Assistance - Patient > 75% Assistive device: Walker-rolling   Walk 50 feet activity   Assist    Assist level: Minimal Assistance - Patient > 75% Assistive device: Walker-rolling    Walk 150 feet activity   Assist Walk 150 feet activity did not occur: Safety/medical concerns         Walk 10 feet on uneven surface  activity   Assist Walk 10 feet on uneven surfaces activity did not occur: Safety/medical concerns         Wheelchair     Assist Will patient use wheelchair at discharge?: No             Wheelchair 50 feet with 2 turns activity    Assist            Wheelchair 150 feet activity     Assist          Blood pressure 127/72, pulse 90, temperature 98.3 F (36.8 C), temperature source Oral, resp. rate 18, height 5\' 6"  (1.676 m), weight 99.5 kg, SpO2 100 %.  Medical Problem List and Plan:  1. 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 Ordering PT/OT and SLP for functional deficits.  2. Antithrombotics:  -DVT/anticoagulation: Pharmaceutical: Lovenox  -antiplatelet therapy: N/A  3. Pain Management: Tylenol as needed- doesn't complain of pain.  4. Mood: LCSW to follow for evaluation and support  -antipsychotic agents:/A  5. Neuropsych: This patient is not fully capable of making decisions on own behalf. Need to determine who makes decisions for pt.  6. Skin/Wound Care: Continue Aquacel with dry dressing for R LQ wound. Change daily. Continue to offer nutritional supplements with meals as p.o. intake is poor  7. Fluids/Electrolytes/Nutrition: Will discontinue IV fluids. Monitor  intake/output. Check electrolytes/CMP on Monday.  8. HTN: Monitor blood pressures 3 times daily. Continue amlodipine daily  9. Anemia: H/H 8.5 on 9/16-->continue iron supplement twice daily  CBG (last 3)  Recent Labs    06/11/19 2119 06/12/19 0623 06/12/19 1218  GLUCAP 277* 188* 209*    10. T2DM: Blood sugars currently being managed with sliding scale insulin. Was on glyburide PTA. Will continue to monitor blood sugars achs. Check hemoglobin A1c with next set of labs.  -labs Monday- will con't SSI 11. Hydrocephalus s/p VP shunt and previous ventriculitis.: Monitor for fevers or recurrent drainage with increase in activity.  12. Impaired vision- will try to determine how much vision loss and if acute vs more chronic.  13. Impaired cognition- will order SLP for cognition, esp for word finding deficits and overall cognition.    LOS: 1 days A FACE TO FACE EVALUATION WAS PERFORMED  Jaece Ducharme 06/12/2019, 3:37 PM

## 2019-06-12 NOTE — Evaluation (Signed)
Occupational Therapy Assessment and Plan  Patient Details  Name: [name] MRN: [id number] Date of Birth: [birth year]  OT Diagnosis: cognitive deficits, disturbance of vision and muscle weakness (generalized) Rehab Potential: Rehab Potential (ACUTE ONLY): Excellent ELOS: 10-12 days   Today's Date: 06/12/2019 OT Individual Time: 2500-3704 and 1330-1430 OT Individual Time Calculation (min): 60 min  And 60 min  Problem List:  Patient Active Problem List   Diagnosis Date Noted  . 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 S/P VP shunt   . Diabetes mellitus type 2 in obese ([place])   . Acute blood loss anemia   . Elevated BUN 05/12/2019  . Elevated serum creatinine 05/12/2019  . Pituitary tumor 05/12/2019  . Vitamin D deficiency 05/12/2019  . DJD (degenerative joint disease) 05/12/2019  . 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 Febrile illness   . AKI (acute kidney injury) ([place]) 10/30/2018  . Pituitary adenoma ([place]) 10/30/2018  . Acute bacterial meningitis 10/30/2018  . Sepsis ([place]) 10/30/2018  . Headache 10/29/2018  . Acute encephalopathy 10/29/2018  . Rhabdomyolysis 10/29/2018  . History of pituitary adenoma 10/29/2018  . Glaucoma 10/29/2018  . Atherosclerosis 10/29/2018  . Acute purulent meningitis 10/29/2018  . CKD (chronic kidney disease), stage III 10/29/2018  . Meningitis 10/29/2018  . Fever 10/29/2018  . Malignant neoplasm of lower-outer quadrant of right breast of female, estrogen receptor negative ([place]) 06/09/2016  . Morbid obesity with BMI of 40.0-44.9, adult ([place]) 05/25/2015  . Type II diabetes mellitus, uncontrolled ([place]) 05/25/2015  . Normocytic anemia 04/27/2014  . Hyperlipidemia, unspecified 10/22/2011  . Essential hypertension with goal blood pressure less than 130/85 10/22/2011  . DM (diabetes mellitus) ([place]) 10/22/2011  . HTN (hypertension)   . Diabetes mellitus type II, non insulin dependent  ([place])   . Dyslipidemia     Past Medical History:  Past Medical History:  Diagnosis Date  . Anemia   . Breast cancer ([place]) 12/29/13   right 6:00 o'clock, lower outer  . Cataract of left eye   . Diabetes mellitus    type 2  . Family history of malignant neoplasm of breast   . Glaucoma    MD just watching, no eye drops  . High cholesterol   . History of blood transfusion 2015   with chemo treatments  . History of radiation therapy 08/12/14- 10/11/14   right breast /50.4 Gy/28 fx, right breast boost/ 10 Gy/ 5 fx  . HTN (hypertension)   . Personal history of chemotherapy   . Personal history of radiation therapy   . Pituitary adenoma ([place]) 10/30/2018  . Wears dentures    full top-partial bottom  . Wears dentures    full  . Wears glasses   . Wears glasses    Past Surgical History:  Past Surgical History:  Procedure Laterality Date  . ABDOMINAL HYSTERECTOMY  1993   endometriosis  . BREAST BIOPSY Left 12/29/2013  . BREAST LUMPECTOMY Right 02/08/2014  . BREAST LUMPECTOMY WITH AXILLARY LYMPH NODE BIOPSY Right 02/08/14   invasive ductal  . CRANIOTOMY N/A 04/15/2019   Procedure: Transphenoid resection of pituitary tumor with fat graft;  Surgeon: Penny Part, MD;  Location: [facility];  Service: Neurosurgery;  Laterality: N/A;  . IR IMAGE GUIDED FLUID DRAIN BY CATHETER  04/19/2019  . MULTIPLE TOOTH EXTRACTIONS    . PLACEMENT OF LUMBAR DRAIN N/A 04/24/2019   Procedure: PLACEMENT OF LUMBAR DRAIN;  Surgeon: Penny Moore, MD;  Location: [facility];  Service: Neurosurgery;  Laterality: N/A;  . PLACEMENT OF LUMBAR DRAIN N/A 05/16/2019   Procedure: PLACEMENT OF LUMBAR DRAIN;  Surgeon: Penny Pies, MD;  Location: [facility];  Service: Neurosurgery;  Laterality: N/A;  PLACEMENT OF LUMBAR DRAIN  . PORT-A-CATH REMOVAL    . PORTACATH PLACEMENT Left 02/08/2014   Procedure: INSERTION PORT-A-CATH;  Surgeon: Penny Bookbinder, MD;  Location: [facility];  Service: General;  Laterality: Left;  .  REPAIR OF CEREBROSPINAL FLUID LEAK N/A 04/29/2019   Procedure: Endoscopic Endonasal Cerebrospinal Fluid Leak repair, Abdominal wound revision and fat graft harvest, Intrathecal Fluorocein Injection;  Surgeon: Penny Part, MD;  Location: [facility];  Service: Neurosurgery;  Laterality: N/A;  . TONSILLECTOMY    . TRANSNASAL APPROACH N/A 04/15/2019   Procedure: TRANSNASAL APPROACH;  Surgeon: Penny Belfast, MD;  Location: [facility];  Service: ENT;  Laterality: N/A;  . TRANSPHENOIDAL APPROACH EXPOSURE N/A 04/15/2019   Procedure: ENDOSCOPIC TRANS NASAL APPROACH WITH FUSIONN;  Surgeon: Penny Belfast, MD;  Location: [facility];  Service: ENT;  Laterality: N/A;  . TRANSPHENOIDAL APPROACH EXPOSURE N/A 04/29/2019   Procedure: Endoscopic repair of spinal fluid leak ;  Surgeon: Penny Belfast, MD;  Location: [facility];  Service: ENT;  Laterality: N/A;  . TRANSPHENOIDAL PITUITARY RESECTION  2006  . VENTRICULOPERITONEAL SHUNT Right 05/31/2019   Procedure: SHUNT INSERTION VENTRICULAR-PERITONEAL; REMOVAL OF LUMBAR DRAIN;  Surgeon: Penny Part, MD;  Location: [facility];  Service: Neurosurgery;  Laterality: Right;  SHUNT INSERTION VENTRICULAR-PERITONEAL; REMOVAL OF LUMBAR DRAIN  . 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 History taken from chart review due to cognition. She was admitted on8/7/2020for repeat endoscopic transphenoidal resection of tumor by Drs. [name] and [name]. Post op course complicated by fevers, confusion, cognitive deficits, visual disturbance within the right temporal field, nasal drainage, mobility.She was taken to OR for lumbar drain placement on8/16/2020. Follow-up head CT on 04/27/2019  showed large pneumocephalus with mass-effect on the right frontal lobe and diffuse cerebral sulcal effacement as well as posterior displacement of brainstem. This was thought to be secondary to train and therefore drain was clamped. She was taken back to OR on8/21/2020for repair of CSF leak with abdominal wound revision. On 8/26/2020she developed recurrent fever with tachycardia. Was monitored on empiric antibiotics, Lumbar drain clamped as drainage resolved but on 8/31, she had recurrent drainage therefore placed on bedrest. On 09/06, she developed hypoxia with fever and tachycardia with decline in mental status. CSF culture showed 830 WBC with CSF protein -488. ID consulted for input and she was started on Vanc/Cefepime for bacterial meningitis.  She continued to have fevers and antibiotics caused brought in.  CSF cultures and blood cultures were negative and INT recommended ENT evaluation as well as shunt revision.  Dr. Venetia Constable felt that changes were due to ventriculitis and recommended monitoring.  She developed lethargy with attempts at lumbar drain clamping..  She continued to have decline in mental status with CT of head showing concerns of hydrocephalus.  Antibiotics was discontinued on zero 9/18 as she had been afebrile x24 hours.  She was taken to the OR on 922 for removal of lumbar drain and placement of VP shunt.  She tolerated extubation without difficulty  and mentation has been stable.  No recurrent fevers noted.  Follow-up CT head showed continued enlargement of ventricles therefore shunt was dialed down to 2.0 on 09/25.  Therapy resumed and patient is making progress with therapy.  She continues have balance deficits with shuffling, requires cues for sequencing with task as well as some dizziness with activity.  CIR recommended due to functional decline.  Patient currently requires mod with basic self-care skills secondary to muscle weakness, decreased cardiorespiratoy endurance,  decreased memory and decreased standing balance.  Prior to hospitalization, patient could complete BADLs with independent .  Patient will benefit from skilled intervention to increase level of independence with iADL prior to discharge home with family.  Anticipate patient will require 24 hour supervision and follow up home health.  OT - End of Session Endurance Deficit: Yes OT Assessment Rehab Potential (ACUTE ONLY): Excellent OT Barriers to Discharge: Medical stability OT Patient demonstrates impairments in the following area(s): Balance;Endurance;Vision;Motor;Cognition;Skin Integrity OT Basic ADL's Functional Problem(s): Grooming;Bathing;Dressing;Toileting OT Advanced ADL's Functional Problem(s): Simple Meal Preparation OT Transfers Functional Problem(s): Toilet;Tub/Shower OT Additional Impairment(s): None OT Plan OT Intensity: Minimum of 1-2 x/day, 45 to 90 minutes OT Frequency: 5 out of 7 days OT Duration/Estimated Length of Stay: 10-12 days OT Treatment/Interventions: Balance/vestibular training;Discharge planning;Pain management;Self Care/advanced ADL retraining;Therapeutic Activities;UE/LE Coordination activities;Visual/perceptual remediation/compensation;Therapeutic Exercise;Skin care/wound managment;Patient/family education;Functional mobility training;Disease mangement/prevention;Cognitive remediation/compensation;Community reintegration;DME/adaptive equipment instruction;Neuromuscular re-education;Psychosocial support;UE/LE Strength taining/ROM;Wheelchair propulsion/positioning OT Self Feeding Anticipated Outcome(s): No goal OT Basic Self-Care Anticipated Outcome(s): Supervision OT Toileting Anticipated Outcome(s): Supervision OT Bathroom Transfers Anticipated Outcome(s): Supervision OT Recommendation Recommendations for Other Services: Neuropsych consult Patient destination: Home Follow Up Recommendations: Home health OT Equipment Recommended: To be determined Skilled  Therapeutic Intervention Skilled OT session completed with focus on initial evaluation, education on OT role/POC, and establishment of patient-centered goals.   Pt greeted in bed with NT present, finishing up with perihygiene post bladder incontinence in brief. Pt then engaged in bathing/dressing (EOB sit<stand using RW) and toileting (using BSC, continent bowel void) during session. Functional transfers completed with or without RW and Mod A via stand pivot. Pt required increased time to advance each LE and assistance with weight shifting. Mod A for sit<stands and for dynamic standing balance during LB self care. She was able to assist with clothing mgt during toileting but needed Max A for hygiene. Steady assist for dynamic sitting when pt washed feet and donned gripper socks via propping her legs up on the bed. Pt actively problem solving during LB tasks. After using the BSC, pt transferred to EOB. She was left in care of RN at session exit.   2nd Session 1:1 tx (60 min) Pt greeted in the w/c with no c/o pain. Finishing up her ginger ale. OT brought her a cup of ice and pt was able to pour her soda and open the straw wrapping. Discussed DME needs for home, with pt reporting that her father Fritz Pickerel, who is 48 years old and uses a cane sometimes) already has a sturdy shower seat and 3:1. Pts sister also lives with him. When pt finished her drink, she completed face washing and oral care while standing at the sink, Min A sit<stand and steady assist for balance. She needed to sit to wash her hands due to fatigue. Pt then completed B UE therapeutic exercise using an orange tband, 10 reps of 4 exercises. She needed max cuing to recall the exercises after performing them. On 2nd attempt at recall, she was able to remember 3/4  but needed mod cuing for proper technique. Pt remained in the w/c with safety belt fastened and all needs within reach.   OT Evaluation Precautions/Restrictions  Precautions Precautions:  Fall Restrictions Weight Bearing Restrictions: No General Chart Reviewed: Yes Pain: No c/o pain during session    Home Living/Prior [place] expects to be discharged to:: Private residence Living Arrangements: Children, Non-relatives/Friends Available Help at Discharge: Family, Available 24 hours/day Type of Home: House Home Access: Stairs to enter [employer] of Steps: 3 STE Entrance Stairs-Rails: Can reach both Home Layout: One level Bathroom Shower/Tub: Multimedia programmer: Standard Bathroom Accessibility: Yes  Lives With: Alone(Lived alone PTA but will d/c to father's home. Information provided reflect's father's home) IADL History Homemaking Responsibilities: Yes(Pt completed all IADLs independently PTA. 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 Leisure and Hobbies: Singing in the church choir Prior Function Level of Independence: Independent with gait, Independent with basic ADLs, Independent with homemaking with ambulation  Able to Take Stairs?: Yes Driving: Yes  ADL ADL Eating: Not assessed Grooming: Setup Where Assessed-Grooming: Edge of bed Upper Body Bathing: Supervision/safety Where Assessed-Upper Body Bathing: Edge of bed Lower Body Bathing: Minimal assistance Where Assessed-Lower Body Bathing: Edge of bed Upper Body Dressing: Supervision/safety Where Assessed-Upper Body Dressing: Other (Comment)(sitting on BSC) Lower Body Dressing: Moderate assistance Where Assessed-Lower Body Dressing: Edge of bed Toileting: Maximal assistance Where Assessed-Toileting: Bedside Commode Toilet Transfer: Moderate assistance Toilet Transfer  Method: Stand pivot Science writer: Radiographer, therapeutic: Not assessed Vision Baseline Vision/History: Wears glasses Wears Glasses: Distance only(driving) Patient Visual Report: Blurring of vision;Peripheral vision impairment(Pt reports field cut in Lt eye) Perception  Perception: Within Functional Limits Praxis Praxis: Intact Cognition Overall Cognitive Status: Within Functional Limits for tasks assessed Arousal/Alertness: Awake/alert Orientation Level: Person;Place;Situation Person: Oriented Place: Oriented Situation: Oriented Year: 2020 Month: September Day of Week: Incorrect Memory: Impaired Memory Impairment: Decreased recall of new information Immediate Memory Recall: Sock;Blue;Bed Memory Recall Sock: With Cue Memory Recall Blue: With Cue Memory Recall Bed: With Cue Attention: Sustained Focused Attention: Appears intact Sustained Attention: Appears intact Awareness: Appears intact Problem Solving: Appears intact Safety/Judgment: Appears intact Sensation Sensation Light Touch: Appears Intact Proprioception: Appears Intact Coordination Gross Motor Movements are Fluid and Coordinated: No Fine Motor Movements are Fluid and Coordinated: No Coordination and Movement Description: coordination impaired secondary to generalized weakness, slow movements Finger Nose Finger Test: WNL B UEs Motor  Motor Motor: Other (comment) Motor - Skilled Clinical Observations: generalized weakness Mobility  Mod A stand pivot BSC transfers Trunk/Postural Assessment  Cervical Assessment Cervical Assessment: Within Functional Limits Thoracic Assessment Thoracic Assessment: Within Functional Limits Lumbar Assessment Lumbar Assessment: Within Functional Limits Postural Control Postural Control: Within Functional Limits  Balance Balance Balance Assessed: Yes Dynamic Sitting Balance Dynamic Sitting - Level of Assistance: 4: Min assist(donning gripper socks  EOB) Dynamic Standing - Level of Assistance: 3: Mod assist(Clothing mgt during toileting) Dynamic Standing - Balance Activities: Lateral lean/weight shifting;Forward lean/weight shifting Extremity/Trunk Assessment RUE Assessment RUE Assessment: Within Functional Limits Active Range of Motion (AROM) Comments: Limited shoulder range <90 degrees flexion and abduction (due to hx of arthritis). All other ranges [facility] Strength Comments: 3+/5 grossly LUE Assessment Active Range of Motion (AROM) Comments: Limited shoulder range <90 degrees flexion and abduction (due to hx  of arthritis). All other ranges [facility] General Strength Comments: 3+/5 grossly  Refer to Care Plan for Long Term Goals  Recommendations for other services: Neuropsych and Therapeutic Recreation  Pet therapy and Outing/community reintegration   Discharge Criteria: Patient will be discharged from OT if patient refuses treatment 3 consecutive times without medical reason, if treatment goals not met, if there is a change in medical status, if patient makes no progress towards goals or if patient is discharged from hospital.  The above assessment, treatment plan, treatment alternatives and goals were discussed and mutually agreed upon: by patient  Skeet Simmer 06/12/2019, 11:41 AM

## 2019-06-12 NOTE — H&P (Signed)
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 She was admitted on 04/15/19 for repeat endoscopic transphenoidal resection of tumor by Drs. Ostegard and [employer]. Post op course complicate by fevers with confusion, cognitive deficits and visual changes. She also had nasal drainage with activity concerning for leak therefore taken to OR for placement of lumbar drain on 04/24/19. Follow up CT head on 08/19 showed large pneumocephalus with mass-effect on right frontal lobe and diffuse cerebral sulcal effacement as well as posterior displacement of brainstem. This was thought to be secondary to drain therefore drain was clamped and she was taken back to the OR on 8/21 for repair of CSF leak with abdominal wound revision. On 8/26, she developed recurrent fever with tachycardia and was started on empiric antibiotics. She had recurrent nasal drainage with clear attempts at clamping lumbar drain therefore was placed on bedrest. On 09/06, she developed hypoxia with fever and tachycardia and declining mental status. CSF smear showed 830 WBCs with protein elevated at 448. ID consulted for input and she was started on Vanco and cefepime for bacterial meningitis.  She continued to have fevers and antibiotics caused brought in. CSF cultures and blood cultures were negative and INT recommended ENT evaluation as well as shunt revision. Dr. Venetia Constable felt that changes were due to ventriculitis and recommended monitoring. She developed lethargy with attempts at lumbar drain clamping.. She continued to have decline in mental status with CT of head showing concerns of hydrocephalus. Antibiotics was discontinued on zero 9/18 as she had  been afebrile x24 hours. She was taken to the OR on 922 for removal of lumbar drain and placement of VP shunt. She tolerated extubation without difficulty and mentation has been stable. No recurrent fevers noted. Follow-up CT head showed continued enlargement of ventricles therefore shunt was dialed down to 2.0 on 09/25. Therapy resumed and patient is making progress with therapy. She continues have balance deficits with shuffling, requires cues for sequencing with task as well as some dizziness with activity. CIR recommended due to functional decline.  Pt reports she's ready to come to rehab- Notes she's been in the hospital "for a couple of weeks for surgery". Admits to blurry vision B/L but L much worse than R- denies double vision.  Review of Systems  Constitutional: Negative for chills and fever.  HENT: Negative for hearing loss and tinnitus.  Eyes: Positive for blurred vision (decreased vision in right and loss of vision in left now).  Respiratory: Negative for cough and shortness of breath.  Cardiovascular: Negative for chest pain and palpitations.  Gastrointestinal: Negative for constipation, heartburn and nausea.  Genitourinary: Negative for dysuria and urgency.  Musculoskeletal: Negative for myalgias and neck pain.  Skin: Negative for rash.  Neurological: Negative for dizziness, focal weakness and headaches.  Psychiatric/Behavioral: Positive for memory loss. Negative for depression. The patient is not nervous/anxious.       Past Medical History:  Diagnosis Date  . Anemia   . Breast cancer ([place]) 12/29/13   right 6:00 o'clock, lower outer  . Cataract of left eye   . Diabetes mellitus    type 2  . Family history of malignant neoplasm of breast   . Glaucoma    MD just watching, no  eye drops  . High cholesterol   . History of blood transfusion 2015   with chemo treatments  . History of radiation therapy 08/12/14- 10/11/14   right breast /50.4 Gy/28 fx, right breast boost/ 10 Gy/ 5 fx   . HTN (hypertension)   . Personal history of chemotherapy   . Personal history of radiation therapy   . Pituitary adenoma ([facility]) 10/30/2018  . Wears dentures    full top-partial bottom  . Wears dentures    full  . Wears glasses   . Wears glasses         Past Surgical History:  Procedure Laterality Date  . ABDOMINAL HYSTERECTOMY  1993   endometriosis  . BREAST BIOPSY Left 12/29/2013  . BREAST LUMPECTOMY Right 02/08/2014  . BREAST LUMPECTOMY WITH AXILLARY LYMPH NODE BIOPSY Right 02/08/14   invasive ductal  . CRANIOTOMY N/A 04/15/2019   Procedure: Transphenoid resection of pituitary tumor with fat graft; Surgeon: Judith Part, MD; Location: [facility]; Service: Neurosurgery; Laterality: N/A;  . IR IMAGE GUIDED FLUID DRAIN BY CATHETER  04/19/2019  . MULTIPLE TOOTH EXTRACTIONS    . PLACEMENT OF LUMBAR DRAIN N/A 04/24/2019   Procedure: PLACEMENT OF LUMBAR DRAIN; Surgeon: Eustace Moore, MD; Location: [facility]; Service: Neurosurgery; Laterality: N/A;  . PLACEMENT OF LUMBAR DRAIN N/A 05/16/2019   Procedure: PLACEMENT OF LUMBAR DRAIN; Surgeon: Newman Pies, MD; Location: [facility]; Service: Neurosurgery; Laterality: N/A; PLACEMENT OF LUMBAR DRAIN  . PORT-A-CATH REMOVAL    . PORTACATH PLACEMENT Left 02/08/2014   Procedure: INSERTION PORT-A-CATH; Surgeon: Rolm Bookbinder, MD; Location: [facility]; Service: General; Laterality: Left;  . REPAIR OF CEREBROSPINAL FLUID LEAK N/A 04/29/2019   Procedure: Endoscopic Endonasal Cerebrospinal Fluid Leak repair, Abdominal wound revision and fat graft harvest, Intrathecal Fluorocein Injection; Surgeon: Judith Part, MD; Location: [facility]; Service: Neurosurgery; Laterality: N/A;  . TONSILLECTOMY    . TRANSNASAL APPROACH N/A 04/15/2019   Procedure: TRANSNASAL APPROACH; Surgeon: Jerrell Belfast, MD; Location: [facility]; Service: ENT; Laterality: N/A;  . TRANSPHENOIDAL APPROACH EXPOSURE N/A 04/15/2019   Procedure: ENDOSCOPIC TRANS NASAL APPROACH WITH  FUSIONN; Surgeon: Jerrell Belfast, MD; Location: [facility]; Service: ENT; Laterality: N/A;  . TRANSPHENOIDAL APPROACH EXPOSURE N/A 04/29/2019   Procedure: Endoscopic repair of spinal fluid leak ; Surgeon: Jerrell Belfast, MD; Location: [facility]; Service: ENT; Laterality: N/A;  . TRANSPHENOIDAL PITUITARY RESECTION  2006  . VENTRICULOPERITONEAL SHUNT Right 05/31/2019   Procedure: SHUNT INSERTION VENTRICULAR-PERITONEAL; REMOVAL OF LUMBAR DRAIN; Surgeon: Judith Part, MD; Location: [facility]; Service: Neurosurgery; Laterality: Right; SHUNT INSERTION VENTRICULAR-PERITONEAL; REMOVAL OF LUMBAR DRAIN  . WISDOM TOOTH EXTRACTION          Family History  Problem Relation Age of Onset  . Hypertension Mother   . Diabetes type II Mother   . Breast cancer Mother    dx ~110; deceased 62  . Hypertension Father   . Breast cancer Maternal Aunt    deceased 81  . Cancer Maternal Uncle    unk. type; deceased 25s  . Cancer Maternal Uncle    unk. type; deceased late 37s  . Uterine cancer Cousin 72   daughter of an unaffected mat aunt who is 7  Social History: Lives alone. Widowed? Has a daughter in [place]? She eports that she quit smoking about 25 years ago. Her smoking use included cigarettes. She has never used smokeless tobacco. She drinks a glass of wine couple of times a month. She reports that she does not use drugs.  She said her daughter cannot help her when she goes home, but has multiple neighbors, her dad who's retired and some "brothers and sisters" who could help out. Lives alone.  Allergies: No Known Allergies        Medications Prior to Admission  Medication Sig Dispense Refill  . amLODipine (NORVASC) 10 MG tablet Take 10 mg by mouth daily.     Marland Kitchen atorvastatin (LIPITOR) 40 MG tablet Take 40 mg by mouth daily.    . diclofenac (VOLTAREN) 75 MG EC tablet Take 75 mg by mouth 2 (two) times daily.    Marland Kitchen FERREX 150 150 MG capsule Take 150 mg by mouth 2 (two) times daily.    Marland Kitchen gabapentin (NEURONTIN)  300 MG capsule TAKE 1 CAPSULE BY MOUTH AT BEDTIME (Patient taking differently: Take 300 mg by mouth 3 (three) times daily. ) 30 capsule 4  . glucose monitoring kit (FREESTYLE) monitoring kit One Touch Ultra machine and strips, patient tests her blood sugars bid    . glyBURIDE (DIABETA) 5 MG tablet Take 5 mg by mouth 2 (two) times daily with a meal.     . lisinopril-hydrochlorothiazide (ZESTORETIC) 20-12.5 MG tablet Take 2 tablets by mouth daily.    . metFORMIN (GLUCOPHAGE-XR) 500 MG 24 hr tablet Take 1,000 mg by mouth daily with breakfast.     . ONE TOUCH ULTRA TEST test strip     . tiZANidine (ZANAFLEX) 4 MG tablet Take 4 mg by mouth every 8 (eight) hours as needed for muscle spasms.    . Vitamin D, Ergocalciferol, (DRISDOL) 1.25 MG (50000 UT) CAPS capsule Take 50,000 Units by mouth every Sunday.     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 Totally independent.  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  Functional Status:  Mobility:  Bed Mobility  Overal bed mobility: Needs Assistance  Bed Mobility: Sit to Supine  Rolling: Mod assist  Sidelying to sit: Mod assist  Supine to sit: Min guard  Sit to supine: Min assist  General bed mobility comments: Increased time  Transfers  Overall transfer level: Needs assistance  Equipment used: Rolling walker (2 wheeled)  Transfers: Sit to/from Stand  Sit to Stand: Min assist, Mod assist  Stand pivot transfers: Mod  assist  General transfer comment: MinA to rise from edge of bed, modA from toilet  Ambulation/Gait  Ambulation/Gait assistance: Min guard  Gait Distance (Feet): 15 Feet  Assistive device: Rolling walker (2 wheeled)  Gait Pattern/deviations: Step-through pattern, Decreased stride length  General Gait Details: Slow speed, bilateral knee instability, increased lateral sway and trunk flexion  Gait velocity: decreased  Gait velocity interpretation: <1.8 ft/sec, indicate of risk for recurrent falls  ADL:  ADL  Overall ADL's : Needs assistance/impaired  Eating/Feeding: Minimal assistance, Cueing for sequencing, Sitting  Eating/Feeding Details (indicate cue type and reason): Pt required cues to cut food before eating and to open ice cream before attempting to eat it.  Grooming: Min guard, Cueing for sequencing, Wash/dry face, Oral care, Standing,  Cueing for safety  Grooming Details (indicate cue type and reason): Pt min guard for standing grooming at sink with RW; pt requires MIN cues for safety/ sequencing of task as pt asking if her toothpaste with vaseline  Upper Body Bathing: Moderate assistance, Sitting, Cueing for sequencing  Lower Body Bathing: Maximal assistance, Sit to/from stand, Cueing for compensatory techniques, Cueing for sequencing  Lower Body Bathing Details (indicate cue type and reason): Pt fatigues quickly. Requires cues to continue the task and stay focused.  Upper Body Dressing : Moderate assistance, Sitting  Lower Body Dressing: Maximal assistance, Sit to/from stand, Bed level  Lower Body Dressing Details (indicate cue type and reason): RN provided max A to help pt don briefs in bed above knees. Pt moved EOB and performed sit<> stand with mod A to power up and maintain balance. Pt pulled up briefs over hips, required A to pull all the way up  Toilet Transfer: Ambulation, RW, Min guard, Cueing for safety, Cueing for sequencing  Toilet Transfer Details (indicate cue type and  reason): simulated from recliner> EOB; Min guard for safety; cues for hand placement on RW; cues for sequencing of task as pt trying to walk away w/o RW  Toileting- Clothing Manipulation and Hygiene: Total assistance, Sit to/from stand  Toileting - Clothing Manipulation Details (indicate cue type and reason): Therapist assists pt with standing while second person manages clothing.  Functional mobility during ADLs: Rolling walker, Min guard  General ADL Comments: MIN A- MIN guard for ADLs; cues for safety during functional mobility; cues for sequencing of task for standing ADLs  Cognition:  Cognition  Overall Cognitive Status: Impaired/Different from baseline  Orientation Level: Oriented to person, Oriented to place, Oriented to situation, Disoriented to time  Cognition  Arousal/Alertness: Awake/alert  Behavior During Therapy: Flat affect  Overall Cognitive Status: Impaired/Different from baseline  Area of Impairment: Attention, Memory, Following commands, Safety/judgement, Awareness, Problem solving  Orientation Level: Disoriented to, Time, Situation, Place  Current Attention Level: Sustained  Memory: Decreased short-term memory  Following Commands: Follows one step commands with increased time  Safety/Judgement: Decreased awareness of safety, Decreased awareness of deficits  Awareness: Intellectual  Problem Solving: Slow processing, Difficulty sequencing, Requires verbal cues, Requires tactile cues, Decreased initiation  General Comments: When asked what day of the week it is, pt responds with "1, no 11." Decreased short term memory recall about day's events, decreased attention span. States her daughter is outside the room (which she was not).  Blood pressure 120/76, pulse 88, temperature 98.3 F (36.8 C), temperature source Oral, resp. rate 16, height _0  (1.676 m), weight 107.5 kg, SpO2 100 %.  Physical Exam  Nursing note and vitals reviewed.  Constitutional: She appears  well-developed and well-nourished. No distress.  Hair shaved on R side for craniotomy site- incision healing-no drainage or erythema  HENT:  Head: Normocephalic and atraumatic.  Nose: Nose normal.  Mouth/Throat: Oropharynx is clear and moist. No oropharyngeal exudate.  Right crani incision C/D/I and healing well L facial droop notable with smile R tongue deviation also noted Upper midline gum erythema- a cut?  Eyes: No scleral icterus.  EOMs intact- has no nystagmus seen; Impaired/blurry vision- can barely see my outline at end of bed with L eye- with R eye, I'm blurry, but much more distinct, per pt.  Neck: Normal range of motion. Neck supple. No JVD present. No thyromegaly present.  Cardiovascular: Normal rate and regular rhythm. Exam reveals no gallop and no friction rub.  No  murmur heard.  Respiratory: Effort normal and breath sounds normal. No respiratory distress. She has no wheezes. She has no rales.  GI: Soft. Bowel sounds are normal. She exhibits no distension. There is no abdominal tenderness.  RLQ wound with healthy beefy red tissue with minimal drainage. - packed Also has epigastric horizontal incision- glued? And appears to be healing well  Genitourinary: Genitourinary Comments: Has a foley?- external?  Musculoskeletal: Normal range of motion.  Comments: Strength UEs- 4+ to 5-/5 in UEs- slightly weaker on LUE LEs- 4+ to 5-/5- slightly weaker on LLE Tested deltoid/biceps/triceps/WE/grip and finger abd Tested HF/KE/KF/DF/PF  Neurological: She is alert.  Oriented to self and place. Situation "for surgery on the belly" Able to answer some biographic questions but unable to recall month or date of admission, place of employment or her job. Had word finding deficits in addition to poor awareness of deficits. Able to follow simple motor commands without difficulty.  Intact sensation to light touch in all 4 extremities Skin: Skin is warm and dry. She is not diaphoretic.  allevyn  on lumbar midline area- could not look underneath due to pt positioning.  Psychiatric: She has a normal mood and affect. Her behavior is normal.  Ox3 but very vague- didn't truly appear to understand why here and how much assistance she would need at home- cognition appears slightly impaired.   Lab Results Last 48 Hours        Results for orders placed or performed during the hospital encounter of 04/15/19 (from the past 48 hour(s))  Glucose, capillary Status: Abnormal   Collection Time: 06/09/19 8:39 AM  Result Value Ref Range   Glucose-Capillary 180 (H) 70 - 99 mg/dL  Glucose, capillary Status: Abnormal   Collection Time: 06/09/19 11:27 AM  Result Value Ref Range   Glucose-Capillary 208 (H) 70 - 99 mg/dL  Glucose, capillary Status: Abnormal   Collection Time: 06/09/19 4:27 PM  Result Value Ref Range   Glucose-Capillary 257 (H) 70 - 99 mg/dL  Glucose, capillary Status: Abnormal   Collection Time: 06/09/19 9:18 PM  Result Value Ref Range   Glucose-Capillary 165 (H) 70 - 99 mg/dL   Comment 1 Notify RN    Comment 2 Document in Chart   Glucose, capillary Status: Abnormal   Collection Time: 06/10/19 8:24 AM  Result Value Ref Range   Glucose-Capillary 167 (H) 70 - 99 mg/dL  Renal function panel Status: Abnormal   Collection Time: 06/10/19 8:58 AM  Result Value Ref Range   Sodium 135 135 - 145 mmol/L   Potassium 3.8 3.5 - 5.1 mmol/L   Chloride 102 98 - 111 mmol/L   CO2 22 22 - 32 mmol/L   Glucose, Bld 200 (H) 70 - 99 mg/dL   BUN 11 8 - 23 mg/dL   Creatinine, Ser 1.08 (H) 0.44 - 1.00 mg/dL   Calcium 9.3 8.9 - 10.3 mg/dL   Phosphorus 4.0 2.5 - 4.6 mg/dL   Albumin 3.2 (L) 3.5 - 5.0 g/dL   GFR calc non Af Amer 55 (L) >60 mL/min   GFR calc Af Amer >60 >60 mL/min   Anion gap 11 5 - 15    Comment: Performed at [facility], [address] [place], [place] [address]  Glucose, capillary Status: Abnormal   Collection Time: 06/10/19 11:39 AM  Result Value Ref Range    Glucose-Capillary 288 (H) 70 - 99 mg/dL  Glucose, capillary Status: Abnormal   Collection Time: 06/10/19 3:47 PM  Result Value  Ref Range   Glucose-Capillary 246 (H) 70 - 99 mg/dL  Glucose, capillary Status: Abnormal   Collection Time: 06/10/19 9:39 PM  Result Value Ref Range   Glucose-Capillary 180 (H) 70 - 99 mg/dL   Comment 1 Notify RN    Comment 2 Document in Chart    Imaging Results (Last 48 hours)    Medical Problem List and Plan:  1. 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 Ordering PT/OT and SLP for functional deficits.  2. Antithrombotics:  -DVT/anticoagulation: Pharmaceutical: Lovenox  -antiplatelet therapy: N/A  3. Pain Management: Tylenol as needed- doesn't complain of pain.  4. Mood: LCSW to follow for evaluation and support  -antipsychotic agents:/A  5. Neuropsych: This patient is not fully capable of making decisions on own behalf. Need to determine who makes decisions for pt.  6. Skin/Wound Care: Continue Aquacel with dry dressing for R LQ wound. Change daily. Continue to offer nutritional supplements with meals as p.o. intake is poor  7. Fluids/Electrolytes/Nutrition: Will discontinue IV fluids. Monitor intake/output. Check electrolytes/CMP on Monday.  8. HTN: Monitor blood pressures 3 times daily. Continue amlodipine daily  9. Anemia: H/H 8.5 on 9/16-->continue iron supplement twice daily  10. T2DM: Blood sugars currently being managed with sliding scale insulin. Was on glyburide PTA. Will continue to monitor blood sugars achs. Check hemoglobin A1c with next set of labs.  11. Hydrocephalus s/p VP shunt and previous ventriculitis.: Monitor for fevers or recurrent drainage with increase in activity.  12. Impaired vision- will try to determine how much vision loss and if acute  vs more chronic.  13. Impaired cognition- will order SLP for cognition, esp for word finding deficits and overall cognition.  Bary Leriche, PA-C  06/11/2019

## 2019-06-13 ENCOUNTER — Inpatient Hospital Stay (HOSPITAL_COMMUNITY): Payer: BC Managed Care – PPO

## 2019-06-13 ENCOUNTER — Inpatient Hospital Stay (HOSPITAL_COMMUNITY): Payer: BC Managed Care – PPO | Admitting: Physical Therapy

## 2019-06-13 ENCOUNTER — Inpatient Hospital Stay (HOSPITAL_COMMUNITY): Payer: BC Managed Care – PPO | Admitting: Occupational Therapy

## 2019-06-13 DIAGNOSIS — N182 Chronic kidney disease, stage 2 (mild): Secondary | ICD-10-CM

## 2019-06-13 DIAGNOSIS — E1165 Type 2 diabetes mellitus with hyperglycemia: Secondary | ICD-10-CM

## 2019-06-13 DIAGNOSIS — R7401 Elevation of levels of liver transaminase levels: Secondary | ICD-10-CM

## 2019-06-13 DIAGNOSIS — G0490 Encephalitis and encephalomyelitis, unspecified: Secondary | ICD-10-CM

## 2019-06-13 DIAGNOSIS — D649 Anemia, unspecified: Secondary | ICD-10-CM

## 2019-06-13 DIAGNOSIS — I10 Essential (primary) hypertension: Secondary | ICD-10-CM

## 2019-06-13 LAB — CBC WITH DIFFERENTIAL/PLATELET
Abs Immature Granulocytes: 0.03 10*3/uL (ref 0.00–0.07)
Basophils Absolute: 0 10*3/uL (ref 0.0–0.1)
Basophils Relative: 0 %
Eosinophils Absolute: 0.2 10*3/uL (ref 0.0–0.5)
Eosinophils Relative: 3 %
HCT: 25.8 % — ABNORMAL LOW (ref 36.0–46.0)
Hemoglobin: 7.9 g/dL — ABNORMAL LOW (ref 12.0–15.0)
Immature Granulocytes: 1 %
Lymphocytes Relative: 25 %
Lymphs Abs: 1.7 10*3/uL (ref 0.7–4.0)
MCH: 27.3 pg (ref 26.0–34.0)
MCHC: 30.6 g/dL (ref 30.0–36.0)
MCV: 89.3 fL (ref 80.0–100.0)
Monocytes Absolute: 0.3 10*3/uL (ref 0.1–1.0)
Monocytes Relative: 4 %
Neutro Abs: 4.4 10*3/uL (ref 1.7–7.7)
Neutrophils Relative %: 67 %
Platelets: 228 10*3/uL (ref 150–400)
RBC: 2.89 MIL/uL — ABNORMAL LOW (ref 3.87–5.11)
RDW: 18.6 % — ABNORMAL HIGH (ref 11.5–15.5)
WBC: 6.6 10*3/uL (ref 4.0–10.5)
nRBC: 0.3 % — ABNORMAL HIGH (ref 0.0–0.2)

## 2019-06-13 LAB — COMPREHENSIVE METABOLIC PANEL
ALT: 25 U/L (ref 0–44)
AST: 42 U/L — ABNORMAL HIGH (ref 15–41)
Albumin: 3 g/dL — ABNORMAL LOW (ref 3.5–5.0)
Alkaline Phosphatase: 91 U/L (ref 38–126)
Anion gap: 12 (ref 5–15)
BUN: 6 mg/dL — ABNORMAL LOW (ref 8–23)
CO2: 20 mmol/L — ABNORMAL LOW (ref 22–32)
Calcium: 8.9 mg/dL (ref 8.9–10.3)
Chloride: 105 mmol/L (ref 98–111)
Creatinine, Ser: 1.06 mg/dL — ABNORMAL HIGH (ref 0.44–1.00)
GFR calc Af Amer: >60 mL/min (ref >60)
GFR calc non Af Amer: 57 mL/min — ABNORMAL LOW (ref >60)
Glucose, Bld: 165 mg/dL — ABNORMAL HIGH (ref 70–99)
Potassium: 3.7 mmol/L (ref 3.5–5.1)
Sodium: 137 mmol/L (ref 135–145)
Total Bilirubin: 0.4 mg/dL (ref 0.3–1.2)
Total Protein: 6.8 g/dL (ref 6.5–8.1)

## 2019-06-13 LAB — GLUCOSE, CAPILLARY
Glucose-Capillary: 156 mg/dL — ABNORMAL HIGH (ref 70–99)
Glucose-Capillary: 163 mg/dL — ABNORMAL HIGH (ref 70–99)
Glucose-Capillary: 184 mg/dL — ABNORMAL HIGH (ref 70–99)
Glucose-Capillary: 166 mg/dL — ABNORMAL HIGH (ref 70–99)

## 2019-06-13 LAB — HEMOGLOBIN A1C
Hgb A1c MFr Bld: 7.4 % — ABNORMAL HIGH (ref 4.8–5.6)
Mean Plasma Glucose: 165.68 mg/dL

## 2019-06-13 MED ORDER — SALINE SPRAY 0.65 % NA SOLN: 4.0000 | NASAL | 0 refills | Status: AC | PRN

## 2019-06-13 MED ORDER — ACETAMINOPHEN 325 MG PO TABS: 325.0000 mg | ORAL_TABLET | ORAL | Status: AC | PRN

## 2019-06-13 NOTE — Progress Notes (Signed)
[place] PHYSICAL MEDICINE & REHABILITATION PROGRESS NOTE   Subjective/Complaints: Patient seen laying in bed this AM.  She states she slept well overnight.  She notes she had a good first day of therapies yesterday.   ROS: Denies CP, SOB, N/V/D  Objective:   No results found. Recent Labs    06/13/19 1005  WBC 6.6  HGB 7.9*  HCT 25.8*  PLT 228   Recent Labs    06/13/19 1005  NA 137  K 3.7  CL 105  CO2 20*  GLUCOSE 165*  BUN 6*  CREATININE 1.06*  CALCIUM 8.9    Intake/Output Summary (Last 24 hours) at 06/13/2019 1524 Last data filed at 06/13/2019 1300 Gross per 24 hour  Intake 2643.91 ml  Output -  Net 2643.91 ml     Physical Exam: Vital Signs Blood pressure 114/70, pulse 95, temperature 98.5 F (36.9 C), temperature source Oral, resp. rate 18, height 5\' 6"  (1.676 m), weight 99.5 kg, SpO2 100 %. Constitutional: No distress . Vital signs reviewed. HENT: Right craniotomy site Eyes: EOMI. No discharge. Cardiovascular: No JVD. Respiratory: Normal effort.  No stridor. GI: Non-distended. Skin: RLQ wound with dressing Psych: Normal mood.  Normal behavior. Musc: No edema in extremities.  No tenderness in extremities. Neuro: Alert Motor: 4+-5/5 throughout  Assessment/Plan: 1. 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  Physiatrist is providing close team supervision and 24 hour management of active medical problems listed below.  Physiatrist and rehab team continue to assess barriers to discharge/monitor patient progress toward functional and medical goals  Care Tool:  Bathing    Body parts bathed by patient: Right arm, Left arm, Chest, Abdomen, Right upper leg, Left upper leg, Right lower leg, Left lower leg, Face     Body parts n/a: Front perineal area,  Buttocks   Bathing assist Assist Level: Minimal Assistance - Patient > 75%     Upper Body Dressing/Undressing Upper body dressing   What is the patient wearing?: Bra, Pull over shirt    Upper body assist Assist Level: Supervision/Verbal cueing    Lower Body Dressing/Undressing Lower body dressing      What is the patient wearing?: Incontinence brief, Pants     Lower body assist Assist for lower body dressing: Moderate Assistance - Patient 50 - 74%     Toileting Toileting    Toileting assist Assist for toileting: Moderate Assistance - Patient 50 - 74%     Transfers Chair/bed transfer  Transfers assist     Chair/bed transfer assist level: Contact Guard/Touching assist     Locomotion Ambulation   Ambulation assist      Assist level: Minimal Assistance - Patient > 75% Assistive device: Walker-rolling Max distance: 125 ft   Walk 10 feet activity   Assist     Assist level: Minimal Assistance - Patient > 75% Assistive device: Walker-rolling   Walk 50 feet activity   Assist    Assist level: Minimal Assistance - Patient > 75% Assistive device: Walker-rolling    Walk 150 feet activity   Assist Walk 150 feet activity did not occur: Safety/medical concerns         Walk 10 feet on uneven surface  activity   Assist Walk 10 feet on uneven surfaces activity did not occur: Safety/medical concerns         Wheelchair  Assist Will patient use wheelchair at discharge?: No             Wheelchair 50 feet with 2 turns activity    Assist            Wheelchair 150 feet activity     Assist          Blood pressure 114/70, pulse 95, temperature 98.5 F (36.9 C), temperature source Oral, resp. rate 18, height 5\' 6"  (1.676 m), weight 99.5 kg, SpO2 100 %.  Medical Problem List and Plan:  1. 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 Ordering PT/OT and SLP for functional deficits.   Cont CIR 2. Antithrombotics:   -DVT/anticoagulation: Pharmaceutical: Lovenox   -antiplatelet therapy: N/A  3. Pain Management: Tylenol as needed- doesn't complain of pain.  4. Mood: LCSW to follow for evaluation and support   -antipsychotic agents:N/A  5. Neuropsych: This patient is ?capable of making decisions on own behalf. Need to determine who makes decisions for pt.  6. Skin/Wound Care: Continue Aquacel with dry dressing for R LQ wound. Change daily. Continue to offer nutritional supplements with meals as p.o. intake is poor  7. Fluids/Electrolytes/Nutrition: Discontinued IV fluids. Monitor intake/output.  8. HTN: Monitor blood pressures. Continue amlodipine daily   Controlled on 10/5 9. Anemia of chronic disease: Continue iron supplement twice daily  CBG (last 3)  Recent Labs    06/12/19 2111 06/13/19 0626 06/13/19 1134  GLUCAP 149* 156* 163*   Hb 7.9 on 10/5, cont to monitor 10. T2DM with hyperglycemia: Was on glyburide PTA. Will continue to monitor blood sugars achs.   Hemoglobin A1c pending  Con't SSI  Elevated on 10/5 11. Hydrocephalus s/p VP shunt and previous ventriculitis.: Monitor for fevers or recurrent drainage with increase in activity.  12. Impaired vision- will try to determine how much vision loss and if acute vs more chronic.  13. Impaired cognition- SLP for cognition, esp for word finding deficits and overall cognition.   14. Transaminitis  AST mildly elevated on 10/5, cont to monitor 15. CKD stage II  Cr. 1.06 on 10/5  Cont to monitor  LOS: 2 days A FACE TO FACE EVALUATION WAS PERFORMED  Tamiko Leopard Lorie Phenix 06/13/2019, 3:24 PM

## 2019-06-13 NOTE — IPOC Note (Signed)
Individualized overall Plan of Care [facility]) Patient Details Name: [name] MRN: [id number] DOB: [birth year]  Admitting Diagnosis: Cerebral ventriculitis  Hospital Problems: Principal Problem:   Cerebral ventriculitis Active Problems:   CKD (chronic kidney disease), stage II   Transaminitis   Controlled type 2 diabetes mellitus with hyperglycemia, without long-term current use of insulin (HCC)   Benign essential HTN   Anemia     Functional Problem List: Nursing Safety, Skin Integrity  PT Balance, Safety, Behavior, Edema, Endurance, Motor  OT Balance, Endurance, Vision, Motor, Cognition, Skin Integrity  SLP Cognition  TR         Basic ADL's: OT Grooming, Bathing, Dressing, Toileting     Advanced  ADL's: OT Simple Meal Preparation     Transfers: PT Bed Mobility, Bed to Chair, Furniture, Musician, Futures trader, Metallurgist: PT Ambulation, Emergency planning/management officer, Stairs     Additional Impairments: OT None  SLP Communication, Social Cognition expression Problem Solving, Memory, Attention, Awareness  TR      Anticipated Outcomes Item Anticipated Outcome  Self Feeding No goal  Swallowing      Basic self-care  Supervision  Toileting  Supervision   Bathroom Transfers Supervision  Bowel/Bladder  Patient will remain continence of bowel and bladder.  Transfers  supervision  Locomotion  supervision  Communication     Cognition     Pain  Patient will maintain pain level less than 3 on a scale of 1 to 10.  Safety/Judgment  Patient will adhere to safety precautions.   Therapy Plan: PT Intensity: Minimum of 1-2 x/day ,45 to 90 minutes PT Frequency: 5 out of 7 days PT Duration Estimated Length of Stay: 7-9 days OT Intensity: Minimum of 1-2 x/day, 45 to 90 minutes OT Frequency: 5 out of 7 days OT Duration/Estimated Length of Stay: 10-12 days SLP Intensity: Minumum of 1-2 x/day, 30 to 90 minutes SLP Frequency: 3 to 5 out of 7 days SLP  Duration/Estimated Length of Stay: 7-10 days    Team Interventions: Nursing Interventions Patient/Family Education, Medication Management, Discharge Planning, Cognitive Remediation/Compensation, Skin Care/Wound Management  PT interventions Ambulation/gait training, Community reintegration, DME/adaptive equipment instruction, Neuromuscular re-education, Stair training, UE/LE Strength taining/ROM, Wheelchair propulsion/positioning, Training and development officer, Functional electrical stimulation, Pain management, Discharge planning, Skin care/wound management, Therapeutic Activities, UE/LE Coordination activities, Cognitive remediation/compensation, Disease management/prevention, Functional mobility training, Patient/family education, Therapeutic Exercise  OT Interventions Balance/vestibular training, Discharge planning, Pain management, Self Care/advanced ADL retraining, Therapeutic Activities, UE/LE Coordination activities, Visual/perceptual remediation/compensation, Therapeutic Exercise, Skin care/wound managment, Patient/family education, Functional mobility training, Disease mangement/prevention, Cognitive remediation/compensation, Academic librarian, Engineer, drilling, Neuromuscular re-education, Psychosocial support, UE/LE Strength taining/ROM, Wheelchair propulsion/positioning  SLP Interventions Cognitive remediation/compensation, Cueing hierarchy, Functional tasks, Medication managment, Internal/external aids, Speech/Language facilitation  TR Interventions    SW/CM Interventions Discharge Planning, Psychosocial Support, Patient/Family Education   Barriers to Discharge MD  Medical stability and Weight  Nursing      PT Decreased caregiver support unsure if family can provide physical assist at d/c  OT Medical stability    SLP      SW       Team Discharge Planning: Destination: PT-Home ,OT- Home , SLP-Home Projected Follow-up: PT-Home health PT, OT-  Home health OT,  SLP-24 hour supervision/assistance, Outpatient SLP Projected Equipment Needs: PT-To be determined, OT- To be determined, SLP-None recommended by SLP Equipment Details: PT- , OT-  Patient/family involved in discharge planning: PT- Patient,  OT-Patient, SLP-Patient  MD ELOS: 6-9 days. 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 She was admitted on 04/15/19 for repeat endoscopic transphenoidal resection of tumor by Drs. Ostegard and [employer]. Post op course complicate by fevers with confusion, cognitive deficits and visual changes. She also had nasal drainage with activity concerning for leak therefore taken to OR for placement of lumbar drain on 04/24/19. Follow up CT head on 08/19 showed large pneumocephalus with mass-effect on right frontal lobe and diffuse cerebral sulcal effacement as well as posterior displacement of brainstem. This was thought to be secondary to drain therefore drain was clamped and she was taken back to the OR on 8/21 for repair of CSF leak with abdominal wound revision. On 8/26, she developed recurrent fever with tachycardia and was started on empiric antibiotics. She had recurrent nasal drainage with clear attempts at clamping lumbar drain therefore was placed on bedrest. On 09/06, she developed hypoxia with fever and tachycardia and declining mental status. CSF smear showed 830 WBCs with protein elevated at 448. ID consulted for input and she was started on Vanco and cefepime for bacterial meningitis.  She continued to have fevers and antibiotics caused brought in. CSF cultures and blood cultures were negative and INT recommended ENT evaluation as well as shunt revision. Dr. Venetia Constable felt that changes were due to ventriculitis and recommended  monitoring. She developed lethargy with attempts at lumbar drain clamping.. She continued to have decline in mental status with CT of head showing concerns of hydrocephalus. Antibiotics was discontinued on 9/18 as she had been afebrile x24 hours. She was taken to the OR on 922 for removal of lumbar drain and placement of VP shunt. She tolerated extubation without difficulty and mentation has been stable. No recurrent fevers noted. Follow-up CT head showed continued enlargement of ventricles therefore shunt was dialed down to 2.0 on 09/25. She continues have balance deficits with shuffling, requires cues for sequencing with task as well as some dizziness with activity. Will set goals for Supervision with PT/OT/SLP.  Due to the current state of emergency, patients may not be receiving their 3-hours of Medicare-mandated therapy.  See Team Conference Notes for weekly updates to the plan of care

## 2019-06-13 NOTE — Progress Notes (Signed)
Physical Therapy Session Note  Patient Details  Name: [name] MRN: [id number] Date of Birth: May 08, 1956  Today's Date: 06/13/2019 PT Individual Time: [id number] PT Individual Time Calculation (min): 40 min   Short Term Goals: Week 1:  PT Short Term Goal 1 (Week 1): STG=LTG due to ELOS  Skilled Therapeutic Interventions/Progress Updates:  Pt received in bed, reporting need to change clothes 2/2 incontinence with therapist providing gown & pt requiring max cuing & ultimately assistance to take pants off lower legs, as pt pulled them below hips but did not attempt to take them off legs. Pt ambulates room>chairs by elevators with min assist & RW with pt reporting "no strength" in LLE & upon questioning reporting hx of arthritis in LLE & pt with slightly antalgic gait. Pt requires rest break & is transported to gym via w/c dependent assist. Pt negotiates 4 steps with B rails & min assist with cuing for compensatory pattern (pt reports 5-6 STE with B rails at her father's house where she plans to d/c). At end of session pt left in w/c with chair alarm donned & all needs in reach.  Therapy Documentation Precautions:  Precautions Precautions: Fall Restrictions Weight Bearing Restrictions: No   Therapy/Group: Individual Therapy  Waunita Schooner 06/13/2019, 4:11 PM

## 2019-06-13 NOTE — Progress Notes (Signed)
Inpatient Rehabilitation  Patient information reviewed and entered into eRehab system by Vann Okerlund M. Tyah Acord, M.A., CCC/SLP, PPS Coordinator.  Information including medical coding, functional ability and quality indicators will be reviewed and updated through discharge.    

## 2019-06-13 NOTE — Progress Notes (Signed)
Occupational Therapy Session Note  Patient Details  Name: [name] MRN: [id number] Date of Birth: July 27, 1956  Today's Date: 06/13/2019 OT Individual Time: 1500-1529 OT Individual Time Calculation (min): 29 min   Short Term Goals: Week 1:  OT Short Term Goal 1 (Week 1): Pt will complete BSC or toilet transfer with Min A using LRAD OT Short Term Goal 2 (Week 1): Pt will complete LB dressing with Min A OT Short Term Goal 3 (Week 1): Pt will complete 1 grooming task while standing at the sink to improve standing endurance  Skilled Therapeutic Interventions/Progress Updates:    Pt greeted in bed, asleep, easily woken and agreeable to tx. Requesting to use the restroom. Supine<sit completed with supervision and ambulatory transfer to toilet completed with steady assist using RW. She had continent B+B void and required steady assist for balance during toileting tasks. Vcs for handwashing prior to oral care completion when standing at the sink after. Due to standing fatigue, she needed to sit for a bit during oral care. Face washing completed while standing. When pt was finished, she ambulated back to the bed with RW, side-stepping towards HOB with vcs. Supervision for sit<supine where pt was repositioned for comfort. Left her with all needs within reach and bed alarm set. Tx focus placed on functional transfers, ADL retraining, and dynamic balance.   Therapy Documentation Precautions:  Precautions Precautions: Fall Restrictions Weight Bearing Restrictions: No Vital Signs: Therapy Vitals Temp: 98.5 F (36.9 C) Temp Source: Oral Pulse Rate: 95 Resp: 18 BP: 114/70 Patient Position (if appropriate): Sitting Oxygen Therapy SpO2: 100 % O2 Device: Room Air Pain: Min c/o gum soreness during tx. She applied her prescription balm at end of session to address  Pain Assessment Pain Score: 0-No pain ADL: ADL Eating: Not assessed Grooming: Setup Where Assessed-Grooming: Edge of  bed Upper Body Bathing: Supervision/safety Where Assessed-Upper Body Bathing: Edge of bed Lower Body Bathing: Minimal assistance Where Assessed-Lower Body Bathing: Edge of bed Upper Body Dressing: Supervision/safety Where Assessed-Upper Body Dressing: Other (Comment)(sitting on BSC) Lower Body Dressing: Moderate assistance Where Assessed-Lower Body Dressing: Edge of bed Toileting: Maximal assistance Where Assessed-Toileting: Bedside Commode Toilet Transfer: Moderate assistance Toilet Transfer Method: Stand pivot Toilet Transfer Equipment: Engineer, technical sales Transfer: Not assessed     Therapy/Group: Individual Therapy  Riko Lumsden A Abbagayle Zaragoza 06/13/2019, 3:46 PM

## 2019-06-13 NOTE — Progress Notes (Signed)
Social Work Assessment and Plan   Patient Details  Name: [name] MRN: [id number] Date of Birth: 1956-06-14  Today's Date: 06/13/2019  Problem List:  Patient Active Problem List   Diagnosis Date Noted  . Anemia of chronic disease   . CKD (chronic kidney disease), stage II   . Transaminitis   . Controlled type 2 diabetes mellitus with hyperglycemia, without long-term current use of insulin (HCC)   . Benign essential HTN   . Anemia   . 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 Communicating hydrocephalus (HCC)   . S/P VP shunt   . Diabetes mellitus type 2 in obese (HCC)   . Acute blood loss anemia   . Elevated BUN 05/12/2019  . Elevated serum creatinine 05/12/2019  . Pituitary tumor 05/12/2019  . Vitamin D deficiency 05/12/2019  . DJD (degenerative joint disease) 05/12/2019  . 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 Febrile illness   . AKI (acute kidney injury) (HCC) 10/30/2018  . Pituitary adenoma (HCC) 10/30/2018  . Acute bacterial meningitis 10/30/2018  . Sepsis (HCC) 10/30/2018  . Headache 10/29/2018  . Acute encephalopathy 10/29/2018  . Rhabdomyolysis 10/29/2018  . History of pituitary adenoma 10/29/2018  . Glaucoma 10/29/2018  . Atherosclerosis 10/29/2018  . Acute purulent meningitis 10/29/2018  . CKD (chronic kidney disease), stage III 10/29/2018  . Meningitis 10/29/2018  . Fever 10/29/2018  . Malignant neoplasm of lower-outer quadrant of right breast of female, estrogen receptor negative (HCC) 06/09/2016  . Morbid obesity with BMI of 40.0-44.9, adult (HCC) 05/25/2015  . Type II diabetes mellitus, uncontrolled (HCC) 05/25/2015  . Normocytic anemia 04/27/2014  . Hyperlipidemia, unspecified 10/22/2011  . Essential hypertension with goal blood pressure less than 130/85 10/22/2011  . DM (diabetes mellitus) (HCC) 10/22/2011  . HTN (hypertension)   . Diabetes mellitus type II, non insulin dependent (HCC)   . Dyslipidemia    Past Medical  History:  Past Medical History:  Diagnosis Date  . Anemia   . Breast cancer (HCC) 12/29/13   right 6:00 o'clock, lower outer  . Cataract of left eye   . Diabetes mellitus    type 2  . Family history of malignant neoplasm of breast   . Glaucoma    MD just watching, no eye drops  . High cholesterol   . History of blood transfusion 2015   with chemo treatments  . History of radiation therapy 08/12/14- 10/11/14   right breast /50.4 Gy/28 fx, right breast boost/ 10 Gy/ 5 fx  . HTN (hypertension)   . Personal history of chemotherapy   . Personal history of radiation therapy   . Pituitary adenoma (HCC) 10/30/2018  . Wears dentures    full top-partial bottom  . Wears dentures    full  . Wears glasses   . Wears glasses    Past Surgical History:  Past Surgical History:  Procedure Laterality Date  . ABDOMINAL HYSTERECTOMY  1993   endometriosis  . BREAST BIOPSY Left 12/29/2013  . BREAST LUMPECTOMY Right 02/08/2014  . BREAST LUMPECTOMY WITH AXILLARY LYMPH NODE BIOPSY Right 02/08/14   invasive ductal  . CRANIOTOMY N/A 04/15/2019   Procedure: Transphenoid resection of pituitary tumor with fat graft;  Surgeon: Jadene Pierini, MD;  Location: [facility] OR;  Service: Neurosurgery;  Laterality: N/A;  . IR IMAGE GUIDED FLUID DRAIN BY CATHETER  04/19/2019  . MULTIPLE TOOTH EXTRACTIONS    . PLACEMENT OF LUMBAR DRAIN N/A 04/24/2019   Procedure: PLACEMENT OF LUMBAR DRAIN;  Surgeon: Tia Alert, MD;  Location: [facility] OR;  Service: Neurosurgery;  Laterality: N/A;  . PLACEMENT OF LUMBAR DRAIN N/A 05/16/2019   Procedure: PLACEMENT OF LUMBAR DRAIN;  Surgeon: Tressie Stalker, MD;  Location: [facility] OR;  Service: Neurosurgery;  Laterality: N/A;  PLACEMENT OF LUMBAR DRAIN  . PORT-A-CATH REMOVAL    . PORTACATH PLACEMENT Left 02/08/2014   Procedure: INSERTION PORT-A-CATH;  Surgeon: Emelia Loron, MD;  Location: [facility];  Service: General;  Laterality: Left;  . REPAIR OF CEREBROSPINAL FLUID LEAK N/A  04/29/2019   Procedure: Endoscopic Endonasal Cerebrospinal Fluid Leak repair, Abdominal wound revision and fat graft harvest, Intrathecal Fluorocein Injection;  Surgeon: Jadene Pierini, MD;  Location: [facility] OR;  Service: Neurosurgery;  Laterality: N/A;  . TONSILLECTOMY    . TRANSNASAL APPROACH N/A 04/15/2019   Procedure: TRANSNASAL APPROACH;  Surgeon: Osborn Coho, MD;  Location: [facility] OR;  Service: ENT;  Laterality: N/A;  . TRANSPHENOIDAL APPROACH EXPOSURE N/A 04/15/2019   Procedure: ENDOSCOPIC TRANS NASAL APPROACH WITH FUSIONN;  Surgeon: Osborn Coho, MD;  Location: [facility] OR;  Service: ENT;  Laterality: N/A;  . TRANSPHENOIDAL APPROACH EXPOSURE N/A 04/29/2019   Procedure: Endoscopic repair of spinal fluid leak ;  Surgeon: Osborn Coho, MD;  Location: [facility] OR;  Service: ENT;  Laterality: N/A;  . TRANSPHENOIDAL PITUITARY RESECTION  2006  . VENTRICULOPERITONEAL SHUNT Right 05/31/2019   Procedure: SHUNT INSERTION VENTRICULAR-PERITONEAL; REMOVAL OF LUMBAR DRAIN;  Surgeon: Jadene Pierini, MD;  Location: [facility] OR;  Service: Neurosurgery;  Laterality: Right;  SHUNT INSERTION VENTRICULAR-PERITONEAL; REMOVAL OF LUMBAR DRAIN  . WISDOM TOOTH EXTRACTION     Social History:  reports that she quit smoking about 25 years ago. Her smoking use included cigarettes. She has never used smokeless tobacco. She reports previous alcohol use. She reports that she does not use drugs.  Family / Support Systems Marital Status: Widow/Widower How Long?: "years ago" Patient Roles: Parent Children: daughter, Delesia Robert @ [phone] Other Supports: sister, Adolph Pollack @ [phone];  brother, Evlyn Kanner and father, Josefina Do Anticipated Caregiver: sister, brother, daughter, father (all plan to take shifts with pt staying at her father's house as it is more accessible) Ability/Limitations of Caregiver: supervision Caregiver Availability: 24/7 Family Dynamics: Pt describes all family as very involved and  supportive.  Feels very comfortable with the level of assistance they can provide.  Social History Preferred language: English Religion: Baptist Cultural Background: NA Education: college Read: Yes Write: Yes Employment Status: Employed Name of Employer: Librarian, academic with [employer] Return to Work Plans: Pt hopeful she will be able to return to work. Legal History/Current Legal Issues: None Guardian/Conservator: None - per MD, pt is "somewhat capable" of making decisions on her own behalf.   Abuse/Neglect Abuse/Neglect Assessment Can Be Completed: Yes Physical Abuse: Denies Verbal Abuse: Denies Sexual Abuse: Denies Exploitation of patient/patient's resources: Denies Self-Neglect: Denies  Emotional Status Pt's affect, behavior and adjustment status: Pt very pleasant and able to complete the assessment interview without much difficulty.  Did have trouble with general timeframes  i.e. how long ago she was widowed,  time of employment.  She denies any significant emotional distress, however, would benefit from neurospychology consult prior to d/c to further examine cognition in addition to ST. Recent Psychosocial Issues: Chronic health issues/ surgeries and CA treatments, however, was living independently and working. Psychiatric History: None Substance Abuse History: None  Patient / Family Perceptions, Expectations & Goals Pt/Family understanding of illness & functional limitations: Pt  with general understanding that she had shunt placement and of her current functional limitations/ need for CIR. Premorbid pt/family roles/activities: Pt was independent and working f/t as a Librarian, academic. Anticipated changes in roles/activities/participation: Dependent on cognitive gains, father and other family may need to provide caregiver support initially. Pt/family expectations/goals: "I just want to be more clear in my thinking."  [employer]:  None Premorbid Home Care/DME Agencies: None Transportation available at discharge: yes Resource referrals recommended: Neuropsychology  Discharge Planning Living Arrangements: Alone Support Systems: Children, Parent, Other relatives Type of Residence: Private residence Insurance Resources: Media planner (specify)([employer] [place]) Financial Resources: Employment Surveyor, quantity Screen Referred: No Living Expenses: Banker Management: Patient Does the patient have any problems obtaining your medications?: No Home Management: pt Patient/Family Preliminary Plans: Pt plans to d/c to father's home which has better accessibility and he and others can provide any needed supervision/ assist. Social Work Anticipated Follow Up Needs: HH/OP Expected length of stay: 10-12 days  Clinical Impression Pleasant woman here following VPS placement and making good gains with physical and cognitive deficits.  She has very good family support and plans to d/c to father's home where 24/7 supervision can be provided.  Mood appears stable, however, she does admit frustration with memory issues.  Will follow for support and d/c planning needs.  Analia Zuk 06/13/2019, 3:48 PM

## 2019-06-13 NOTE — Evaluation (Signed)
Speech Language Pathology Assessment and Plan  Patient Details  Name: [name] MRN: [id number] Date of Birth: 1956/06/04  SLP Diagnosis: Speech and Language deficits  Rehab Potential: Good ELOS: 7-10 days    Today's Date: 06/13/2019 SLP Individual Time: 0902-1000 SLP Individual Time Calculation (min): 58 min   Problem List:  Patient Active Problem List   Diagnosis Date Noted  . CKD (chronic kidney disease), stage II   . Transaminitis   . Controlled type 2 diabetes mellitus with hyperglycemia, without long-term current use of insulin (HCC)   . Benign essential HTN   . Anemia   . 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 Communicating hydrocephalus (HCC)   . S/P VP shunt   . Diabetes mellitus type 2 in obese (HCC)   . Acute blood loss anemia   . Elevated BUN 05/12/2019  . Elevated serum creatinine 05/12/2019  . Pituitary tumor 05/12/2019  . Vitamin D deficiency 05/12/2019  . DJD (degenerative joint disease) 05/12/2019  . 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 Febrile illness   . AKI (acute kidney injury) (HCC) 10/30/2018  . Pituitary adenoma (HCC) 10/30/2018  . Acute bacterial meningitis 10/30/2018  . Sepsis (HCC) 10/30/2018  . Headache 10/29/2018  . Acute encephalopathy 10/29/2018  . Rhabdomyolysis 10/29/2018  . History of pituitary adenoma 10/29/2018  . Glaucoma 10/29/2018  . Atherosclerosis 10/29/2018  . Acute purulent meningitis 10/29/2018  . CKD (chronic kidney disease), stage III 10/29/2018  . Meningitis 10/29/2018  . Fever 10/29/2018  . Malignant neoplasm of lower-outer quadrant of right breast of female, estrogen receptor negative (HCC) 06/09/2016  . Morbid obesity with BMI of 40.0-44.9, adult (HCC) 05/25/2015  . Type II diabetes mellitus, uncontrolled (HCC) 05/25/2015  . Normocytic anemia 04/27/2014  . Hyperlipidemia, unspecified 10/22/2011  . Essential hypertension with goal blood pressure less than 130/85 10/22/2011  . DM  (diabetes mellitus) (HCC) 10/22/2011  . HTN (hypertension)   . Diabetes mellitus type II, non insulin dependent (HCC)   . Dyslipidemia    Past Medical History:  Past Medical History:  Diagnosis Date  . Anemia   . Breast cancer (HCC) 12/29/13   right 6:00 o'clock, lower outer  . Cataract of left eye   . Diabetes mellitus    type 2  . Family history of malignant neoplasm of breast   . Glaucoma    MD just watching, no eye drops  . High cholesterol   . History of blood transfusion 2015   with chemo treatments  . History of radiation therapy 08/12/14- 10/11/14   right breast /50.4 Gy/28 fx, right breast boost/ 10 Gy/ 5 fx  . HTN (hypertension)   . Personal history of chemotherapy   . Personal history of radiation therapy   . Pituitary adenoma (HCC) 10/30/2018  . Wears dentures    full top-partial bottom  . Wears dentures    full  . Wears glasses   . Wears glasses    Past Surgical History:  Past Surgical History:  Procedure Laterality Date  . ABDOMINAL HYSTERECTOMY  1993   endometriosis  . BREAST BIOPSY Left 12/29/2013  . BREAST LUMPECTOMY Right 02/08/2014  . BREAST LUMPECTOMY WITH AXILLARY LYMPH NODE BIOPSY Right 02/08/14   invasive ductal  . CRANIOTOMY N/A 04/15/2019   Procedure: Transphenoid resection of pituitary tumor with fat graft;  Surgeon: Jadene Pierini, MD;  Location: [facility] OR;  Service: Neurosurgery;  Laterality: N/A;  . IR IMAGE GUIDED FLUID DRAIN BY CATHETER  04/19/2019  .  MULTIPLE TOOTH EXTRACTIONS    . PLACEMENT OF LUMBAR DRAIN N/A 04/24/2019   Procedure: PLACEMENT OF LUMBAR DRAIN;  Surgeon: Tia Alert, MD;  Location[facility] OR;  Service: Neurosurgery;  Laterality: N/A;  . PLACEMENT OF LUMBAR DRAIN N/A 05/16/2019   Procedure: PLACEMENT OF LUMBAR DRAIN;  Surgeon: Tressie Stalker, MD;  Location: [facility] OR;  Service: Neurosurgery;  Laterality: N/A;  PLACEMENT OF LUMBAR DRAIN  . PORT-A-CATH REMOVAL    . PORTACATH PLACEMENT Left 02/08/2014   Procedure: INSERTION PORT-A-CATH;   Surgeon: Emelia Loron, MD;  Location: [facility];  Service: General;  Laterality: Left;  . REPAIR OF CEREBROSPINAL FLUID LEAK N/A 04/29/2019   Procedure: Endoscopic Endonasal Cerebrospinal Fluid Leak repair, Abdominal wound revision and fat graft harvest, Intrathecal Fluorocein Injection;  Surgeon: Jadene Pierini, MD;  Location: [facility] OR;  Service: Neurosurgery;  Laterality: N/A;  . TONSILLECTOMY    . TRANSNASAL APPROACH N/A 04/15/2019   Procedure: TRANSNASAL APPROACH;  Surgeon: Osborn Coho, MD;  Location: [facility] OR;  Service: ENT;  Laterality: N/A;  . TRANSPHENOIDAL APPROACH EXPOSURE N/A 04/15/2019   Procedure: ENDOSCOPIC TRANS NASAL APPROACH WITH FUSIONN;  Surgeon: Osborn Coho, MD;  Location: [facility] OR;  Service: ENT;  Laterality: N/A;  . TRANSPHENOIDAL APPROACH EXPOSURE N/A 04/29/2019   Procedure: Endoscopic repair of spinal fluid leak ;  Surgeon: Osborn Coho, MD;  Location: [facility] OR;  Service: ENT;  Laterality: N/A;  . TRANSPHENOIDAL PITUITARY RESECTION  2006  . VENTRICULOPERITONEAL SHUNT Right 05/31/2019   Procedure: SHUNT INSERTION VENTRICULAR-PERITONEAL; REMOVAL OF LUMBAR DRAIN;  Surgeon: Jadene Pierini, MD;  Location: [facility] OR;  Service: Neurosurgery;  Laterality: Right;  SHUNT INSERTION VENTRICULAR-PERITONEAL; REMOVAL OF LUMBAR DRAIN  . 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 She was admitted on 04/15/19 for repeat endoscopic transphenoidal resection of tumor by Drs. Ostegard and [employer]. Post op course complicate by fevers with confusion, cognitive deficits and visual changes. She also had nasal drainage with activity concerning for leak therefore taken to OR for  placement of lumbar drain on 04/24/19. Follow up CT head on 08/19 showed large pneumocephalus with mass-effect on right frontal lobe and diffuse cerebral sulcal effacement as well as posterior displacement of brainstem. This was thought to be secondary to drain therefore drain was clamped and she was taken back to the OR on 8/21 for repair of CSF leak with abdominal wound revision. On 8/26, she developed recurrent fever with tachycardia and was started on empiric antibiotics. She had recurrent nasal drainage with clear attempts at clamping lumbar drain therefore was placed on bedrest. On 09/06, she developed hypoxia with fever and tachycardia and declining mental status. CSF smear showed 830 WBCs with protein elevated at 448. ID consulted for input and she was started on Vanco and cefepime for bacterial meningitis.  She continued to have fevers and antibiotics caused brought in. CSF cultures and blood cultures were negative and INT recommended ENT evaluation as well as shunt revision. Dr. Johnsie Cancel felt that changes were due to ventriculitis and recommended monitoring. She developed lethargy with attempts at lumbar drain clamping.. She continued to have decline in mental status with CT of head showing concerns of hydrocephalus. Antibiotics was discontinued on zero 9/18  as she had been afebrile x24 hours. She was taken to the OR on 922 for removal of lumbar drain and placement of VP shunt. She tolerated extubation without difficulty and mentation has been stable. No recurrent fevers noted. Follow-up CT head showed continued enlargement of ventricles therefore shunt was dialed down to 2.0 on 09/25. Therapy resumed and patient is making progress with therapy. She continues have balance deficits with shuffling, requires cues for sequencing with task as well as some dizziness with activity. CIR recommended due to functional decline.   Pt presents with cognitive linguistic impairment, deficits include short term  recall, selective attention, executive function/complex problem solving, higher level word finding and anticipatory awareness. Formal cognitive linguistic assessments; Cognistat demonstrated WFL on construction task , CLQT demonstrated WFL on memory and language (however noted functional deficits in conversation), mild deficits in attention and visuospatial skills (noted left side blurriness) and severe deficits in executive function. Pt presents with mild language deficits pertaining to higher level word finding, reflected in generative naming task and in conversation further impacted by recall deficits. Pt expresses no changes in swallow nor speech skills. Pt would benefit from skilled ST services in order to maximize functional independence and reduce burden of care, likely requiring 24 hour supervision and continue ST services.   Skilled Therapeutic Interventions          Skilled ST services focused on cognitive skills. SLP administered  cognitive linguistic assessment, educated pt on results and created plan to address deficits. All questions were answered to satisfaction. Pt was left in room with call bell within reach and bed alarm set. ST recommends to continue skilled ST services.   SLP Assessment  Patient will need skilled Speech Lanaguage Pathology Services during CIR admission    Recommendations  SLP Diet Recommendations: Thin Medication Administration: Whole meds with liquid Supervision: Patient able to self feed Compensations: Minimize environmental distractions;Small sips/bites Postural Changes and/or Swallow Maneuvers: Seated upright 90 degrees Oral Care Recommendations: Oral care BID Patient destination: Home Follow up Recommendations: 24 hour supervision/assistance;Outpatient SLP Equipment Recommended: None recommended by SLP    SLP Frequency 3 to 5 out of 7 days   SLP Duration  SLP Intensity  SLP Treatment/Interventions 7-10 days  Minumum of 1-2 x/day, 30 to 90  minutes  Cognitive remediation/compensation;Cueing hierarchy;Functional tasks;Medication managment;Internal/external aids;Speech/Language facilitation    Pain Pain Assessment Pain Score: 0-No pain  Prior Functioning Cognitive/Linguistic Baseline: Within functional limits Type of Home: House  Lives With: Alone Available Help at Discharge: Family;Available 24 hours/day Vocation: Full time employment  Short Term Goals: Week 1: SLP Short Term Goal 1 (Week 1): Pt will complete complex problem solving tasks with mod A verbal cues for executive function skills (planning, organizing. ..etc.) SLP Short Term Goal 2 (Week 1): Pt will anticipate barriers to discharge and generate appropriate solutions with supervision A verbal cues. SLP Short Term Goal 3 (Week 1): Pt will demonstrate selective attention durng problem solving tasks in 30 minute intervals with supervision A verbal cues for redirection. SLP Short Term Goal 4 (Week 1): Pt will utilizie compensatory aids to recall novel, daily information with supervision A verbal cues. SLP Short Term Goal 5 (Week 1): Pt will complete higher level word finding and generative naming tasks with supervision A semantic cues.  Refer to Care Plan for Long Term Goals  Recommendations for other services: None   Discharge Criteria: Patient will be discharged from SLP if patient refuses treatment 3 consecutive times without medical reason, if treatment goals  not met, if there is a change in medical status, if patient makes no progress towards goals or if patient is discharged from hospital.  The above assessment, treatment plan, treatment alternatives and goals were discussed and mutually agreed upon: by patient  Frieda Arnall  [facility] 06/13/2019, 3:47 PM

## 2019-06-13 NOTE — Progress Notes (Signed)
Physical Therapy Session Note  Patient Details  Name: [name] MRN: [id number] Date of Birth: [birth year]  Today's Date: 06/13/2019 PT Individual Time: 1010-1055 PT Individual Time Calculation (min): 45 min   Short Term Goals: Week 1:  PT Short Term Goal 1 (Week 1): STG=LTG due to ELOS  Skilled Therapeutic Interventions/Progress Updates:   Missed 15 min of skilled PT 2/2 IV team present.   Pt in supine and agreeable to therapy, no c/o pain. Bed mobility w/ supervision and donned pants w/ total assist at EOB, sit>stand w/ min assist and therapist pulled pants over hips. Ambulated to/from therapy gym w/ 1 seated rest break 2/2 fatigue, CGA. Pt c/o mild dizziness w/ gait, BP 125/80, donned thigh-high TEDs and provided w/ intermittent sips of water. Worked on standing balance w/o UE support in multiple bouts, CGA-min assist, while matching playing cards. Stood 60-90 sec at a time before needing seated rest breaks. Ambulated back to room and ended session in w/c, all needs in reach.   Therapy Documentation Precautions:  Precautions Precautions: Fall Restrictions Weight Bearing Restrictions: No  Therapy/Group: Individual Therapy  [name] [name] 06/13/2019, 10:57 AM

## 2019-06-14 ENCOUNTER — Inpatient Hospital Stay (HOSPITAL_COMMUNITY): Payer: BC Managed Care – PPO | Admitting: Physical Therapy

## 2019-06-14 ENCOUNTER — Inpatient Hospital Stay (HOSPITAL_COMMUNITY): Payer: BC Managed Care – PPO

## 2019-06-14 ENCOUNTER — Inpatient Hospital Stay (HOSPITAL_COMMUNITY): Payer: BC Managed Care – PPO | Admitting: Speech Pathology

## 2019-06-14 LAB — GLUCOSE, CAPILLARY
Glucose-Capillary: 160 mg/dL — ABNORMAL HIGH (ref 70–99)
Glucose-Capillary: 188 mg/dL — ABNORMAL HIGH (ref 70–99)
Glucose-Capillary: 167 mg/dL — ABNORMAL HIGH (ref 70–99)
Glucose-Capillary: 132 mg/dL — ABNORMAL HIGH (ref 70–99)

## 2019-06-14 MED ORDER — GLYBURIDE 2.5 MG PO TABS
1.2500 mg | ORAL_TABLET | Freq: Every day | ORAL | Status: DC
  Administered 2019-06-16 – 2019-06-21 (×6): 1.25 mg via ORAL
  Filled 2019-06-14 (×2): qty 0.5
  Filled 2019-06-14 (×2): qty 1
  Filled 2019-06-14 (×2): qty 0.5
  Filled 2019-06-14 (×2): qty 1
  Filled 2019-06-14 (×3): qty 0.5

## 2019-06-14 NOTE — Plan of Care (Signed)
  Problem: Consults Goal: RH BRAIN INJURY PATIENT EDUCATION Description: Description: See Patient Education module for eduction specifics Outcome: Progressing   Problem: RH SKIN INTEGRITY Goal: RH STG SKIN FREE OF INFECTION/BREAKDOWN Outcome: Progressing Goal: RH STG MAINTAIN SKIN INTEGRITY WITH ASSISTANCE Description: STG Maintain Skin Integrity With mod I Assistance. Outcome: Progressing Goal: RH STG ABLE TO PERFORM INCISION/WOUND CARE W/ASSISTANCE Description: STG Able To Perform Incision/Wound Care With min Assistance. Outcome: Progressing   Problem: RH SAFETY Goal: RH STG ADHERE TO SAFETY PRECAUTIONS W/ASSISTANCE/DEVICE Description: STG Adhere to Safety Precautions With min Assistance/Device. Outcome: Progressing   Problem: RH COGNITION-NURSING Goal: RH STG ANTICIPATES NEEDS/CALLS FOR ASSIST W/ASSIST/CUES Description: STG Anticipates Needs/Calls for Assist With mod I Assistance/Cues. Outcome: Progressing   Problem: RH PAIN MANAGEMENT Goal: RH STG PAIN MANAGED AT OR BELOW PT'S PAIN GOAL Description: Less than 3 out of 10 Outcome: Progressing

## 2019-06-14 NOTE — Progress Notes (Signed)
Physical Therapy Session Note  Patient Details  Name: ASYA ENGELKES MRN: [id number] Date of Birth: [birth year]  Today's Date: 06/14/2019 PT Individual Time: 1315-1415 PT Individual Time Calculation (min): 60 min   Short Term Goals: Week 1:  PT Short Term Goal 1 (Week 1): STG=LTG due to ELOS  Skilled Therapeutic Interventions/Progress Updates:  Pt received in w/c consuming lunch & requesting time to finish. Therapist returned a few minutes later & pt agreeable to tx. Pt dons tennis shoes from w/c level with max assist. In hallway, pt ambulates 60 ft x 2 with RW & CGA with slight antalgic, trendelenburg gait LLE. Pt performs side stepping at rail (30 ft to L, 30 ft to R) with CGA and heavy cuing for technique with pt requiring significantly extra time to complete task. Pt stood without UE support & min assist while engaging in reaching outside of BOS to obtain & match cards on velcro board with task focusing on standing balance & weight shifting. Pt performed 5x sit<>stand without BUE support & min assist with task focusing on BLE strengthening. At end of session pt left in w/c with chair alarm donned & all needs in reach.   Pt denies c/o pain during session.  Therapy Documentation Precautions:  Precautions Precautions: Fall Restrictions Weight Bearing Restrictions: No   General: PT Amount of Missed Time (min): 15 Minutes PT Missed Treatment Reason: (lunch)     Therapy/Group: Individual Therapy  Waunita Schooner 06/14/2019, 2:16 PM

## 2019-06-14 NOTE — Progress Notes (Signed)
Speech Language Pathology Daily Session Note  Patient Details  Name: [name] MRN: [id number] Date of Birth: [birth year]  Today's Date: 06/14/2019 SLP Individual Time: 1100-1200 SLP Individual Time Calculation (min): 60 min  Short Term Goals: Week 1: SLP Short Term Goal 1 (Week 1): Pt will complete complex problem solving tasks with mod A verbal cues for executive function skills (planning, organizing. ..etc.) SLP Short Term Goal 2 (Week 1): Pt will anticipate barriers to discharge and generate appropriate solutions with supervision A verbal cues. SLP Short Term Goal 3 (Week 1): Pt will demonstrate selective attention durng problem solving tasks in 30 minute intervals with supervision A verbal cues for redirection. SLP Short Term Goal 4 (Week 1): Pt will utilizie compensatory aids to recall novel, daily information with supervision A verbal cues. SLP Short Term Goal 5 (Week 1): Pt will complete higher level word finding and generative naming tasks with supervision A semantic cues.  Skilled Therapeutic Interventions: Pt was seen for skilled ST targeting cognition. SLP facilitated session with Min A verbal cues for organization and intermittent error awareness and problem solving corrections during a basic money management task (ALFA). Calculating change in basic subtraction problems proved more difficult than adding/finding totals. Pt also read medication labels and filled out a pill chart with 100% accuracy and only Supervision A verbal cues from clinician during basic medication management task (ALFA). Pt verbally recalled general steps of a stand/pivot transfer from wheelchair to bed, however decreased safety awareness noted, as she required Mod A question cues to recognize the need to lock wheelchair brakes prior to attempting to stand. SLP further facilitated session with verbal review of current medications, as listed in medical chart. Pt recognized names of 4/6 medications as familiar  (ones she had taken regularly prior to hospitalization), however she provided incorrect descriptions of those medications' functions, requiring Total A for correction. Due to time constraints, pt did not attempt to organize BID pill box according to her personal medication list, however list was left on pt's dresser and ST will target use of pill box at next available session. Pt was left sitting in wheelchair with call bell within reach and all needs met. Continue per current plan of care.      Pain Pain Assessment Pain Score: 0-No pain  Therapy/Group: Individual Therapy  Arbutus Leas 06/14/2019, 12:05 PM

## 2019-06-14 NOTE — Progress Notes (Signed)
Occupational Therapy Session Note  Patient Details  Name: [name] MRN: [id number] Date of Birth: [birth year]  Today's Date: 06/14/2019 OT Individual Time: 1962-2297 OT Individual Time Calculation (min): 40 min    Short Term Goals: Week 1:  OT Short Term Goal 1 (Week 1): Pt will complete BSC or toilet transfer with Min A using LRAD OT Short Term Goal 2 (Week 1): Pt will complete LB dressing with Min A OT Short Term Goal 3 (Week 1): Pt will complete 1 grooming task while standing at the sink to improve standing endurance     Skilled Therapeutic Interventions/Progress Updates:    Pt received in w/c with c/o agitation under top lip, pt did not rate pain and agreeable to tx. Pt reporting she had performed oral care already this am and had eaten breakfast. Pt completes UB bathing sitting in w/c  at sink with (S) and LB bathing with (S); pt reporting she does not need to wash buttocks and peri area. Pt is (S) for UB dressing and min A for LB dressing to thread feet into pants and don non-skid socks. Pt requiring min VC for sequencing to pull up pants after threading onto legs and turning off water after bathing. Pt oriented to place and month, but was slightly inocrrect for day of the week and date; reporting today was Wednesday the 7th. Pt reports that her goal in IR is to get herself dressed and agreeable to receiving therapy tx for several more days to gain more I d/t pt living alone and needing to drive after d/c. Exited session pt in w/c, safety belt on and all needs met.  Therapy Documentation Precautions:  Precautions Precautions: Fall Restrictions Weight Bearing Restrictions: No   Pain: Pain Assessment Pain Scale: 0-10 Pain Score: 0-No pain      Therapy/Group: Individual Therapy  Erhardt Dada 06/14/2019, 8:50 AM

## 2019-06-14 NOTE — Progress Notes (Signed)
[facility] PHYSICAL MEDICINE & REHABILITATION PROGRESS NOTE   Subjective/Complaints: Patient seen sitting up in her chair working with therapy this morning.  She notes she had a good day of therapies yesterday.  Discussed short length stay with therapies.  She notes her cognition is improving as well.  ROS: Denies CP, SOB, N/V/D  Objective:   No results found. Recent Labs    06/13/19 1005  WBC 6.6  HGB 7.9*  HCT 25.8*  PLT 228   Recent Labs    06/13/19 1005  NA 137  K 3.7  CL 105  CO2 20*  GLUCOSE 165*  BUN 6*  CREATININE 1.06*  CALCIUM 8.9    Intake/Output Summary (Last 24 hours) at 06/14/2019 1324 Last data filed at 06/14/2019 0900 Gross per 24 hour  Intake 600 ml  Output -  Net 600 ml     Physical Exam: Vital Signs Blood pressure 129/75, pulse 85, temperature 98.8 F (37.1 C), temperature source Oral, resp. rate 18, height 5\' 6"  (1.676 m), weight 99.5 kg, SpO2 100 %. Constitutional: No distress . Vital signs reviewed. HENT: Normocephalic.  Atraumatic. Eyes: EOMI. No discharge. Cardiovascular: No JVD. Respiratory: Normal effort.  No stridor. GI: Non-distended. Skin: RLQ wound dressing not examined today. Psych: Normal mood.  Normal behavior. Musc: No edema in extremities.  No tenderness in extremities. Neuro: Alert Motor: 4+-5/5 throughout, unchanged  Assessment/Plan: 1. 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  Physiatrist is providing close team supervision and 24 hour management of active medical problems listed below.  Physiatrist and rehab team continue to assess barriers to discharge/monitor patient progress toward functional and medical goals  Care Tool:  Bathing    Body parts bathed by patient: Left arm, Chest, Right arm, Abdomen, Right upper  leg, Left upper leg, Right lower leg, Left lower leg, Face     Body parts n/a: Buttocks, Front perineal area   Bathing assist Assist Level: Supervision/Verbal cueing     Upper Body Dressing/Undressing Upper body dressing   What is the patient wearing?: Pull over shirt    Upper body assist Assist Level: Supervision/Verbal cueing    Lower Body Dressing/Undressing Lower body dressing      What is the patient wearing?: Pants     Lower body assist Assist for lower body dressing: Minimal Assistance - Patient > 75%     Toileting Toileting    Toileting assist Assist for toileting: Contact Guard/Touching assist     Transfers Chair/bed transfer  Transfers assist     Chair/bed transfer assist level: Contact Guard/Touching assist     Locomotion Ambulation   Ambulation assist      Assist level: Minimal Assistance - Patient > 75% Assistive device: Walker-rolling Max distance: 125 ft   Walk 10 feet activity   Assist     Assist level: Minimal Assistance - Patient > 75% Assistive device: Walker-rolling   Walk 50 feet activity   Assist    Assist level: Minimal Assistance - Patient > 75% Assistive device: Walker-rolling    Walk 150 feet activity   Assist Walk 150 feet activity did not occur: Safety/medical concerns         Walk 10 feet on uneven surface  activity   Assist Walk 10 feet on uneven surfaces activity did not occur: Safety/medical concerns  Wheelchair     Assist Will patient use wheelchair at discharge?: No             Wheelchair 50 feet with 2 turns activity    Assist            Wheelchair 150 feet activity     Assist          Blood pressure 129/75, pulse 85, temperature 98.8 F (37.1 C), temperature source Oral, resp. rate 18, height 5\' 6"  (1.676 m), weight 99.5 kg, SpO2 100 %.  Medical Problem List and Plan:  1. 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 Ordering PT/OT and SLP for functional deficits.   Continue CIR  Team conference today to discuss current and goals and coordination of care, home and environmental barriers, and discharge planning with nursing, case manager, and therapies.  2. Antithrombotics:   -DVT/anticoagulation: Pharmaceutical: Lovenox   -antiplatelet therapy: N/A  3. Pain Management: Tylenol as needed- doesn't complain of pain.  4. Mood: LCSW to follow for evaluation and support   -antipsychotic agents:N/A  5. Neuropsych: This patient is ?capable of making decisions on own behalf. Need to determine who makes decisions for pt.  6. Skin/Wound Care: Continue Aquacel with dry dressing for R LQ wound. Change daily. Continue to offer nutritional supplements with meals as p.o. intake is poor  7. Fluids/Electrolytes/Nutrition: Discontinued IV fluids. Monitor intake/output.  8. HTN: Monitor blood pressures. Continue amlodipine daily   Controlled on 10/6 9. Anemia of chronic disease: Continue iron supplement twice daily  CBG (last 3)  Recent Labs    06/13/19 2116 06/14/19 0648 06/14/19 1133  GLUCAP 166* 160* 188*   Hb 7.9 on 10/5, cont to monitor 10. T2DM with hyperglycemia: Was on glyburide PTA. Will continue to monitor blood sugars achs.   Hemoglobin A1c 7.4  Con't SSI  Glyburide 1.25 started on 10/7 11. Hydrocephalus s/p VP shunt and previous ventriculitis.: Monitor for fevers or recurrent drainage with increase in activity.  12. Impaired vision- will try to determine how much vision loss and if acute vs more chronic.  13. Impaired cognition- SLP for cognition, esp for word finding deficits and overall cognition.   14. Transaminitis  AST mildly elevated on 10/5, cont to monitor 15. CKD stage II  Cr. 1.06 on 10/5  Cont to monitor  LOS: 3 days A  FACE TO FACE EVALUATION WAS PERFORMED   Lorie Phenix 06/14/2019, 1:24 PM

## 2019-06-14 NOTE — Progress Notes (Signed)
Physical Therapy Session Note  Patient Details  Name: [name] MRN: [id number] Date of Birth: Jul 23, 1956  Today's Date: 06/14/2019 PT Individual Time: [id number] PT Individual Time Calculation (min): 30 min   Short Term Goals: Week 1:  PT Short Term Goal 1 (Week 1): STG=LTG due to ELOS  Skilled Therapeutic Interventions/Progress Updates:    Pt seated in w/c upon PT arrival, agreeable to therapy tx and denies pain. Pt transported to gym in w/c for time management. Pt performed stand pivot to mat with RW and CGA, cues for techniques. Pt performed x 10 sit<>stands without UE support working on LE strength. Pt ambulated 2 x 50 ft with RW working on gait and endurance, cues for upright posture and increased step length, CGA. Pt reports having to use the bathroom, transported back to room. Pt ambulated from chair<>toilet this session with RW and CGA, performed clothing management and pericare without assist. Pt maintained standing balance at sink without UE support while washing hands, CGA. Pt left in w/c at end of session with needs in reach and chair alarm set.   Therapy Documentation Precautions:  Precautions Precautions: Fall Restrictions Weight Bearing Restrictions: No    Therapy/Group: Individual Therapy  Netta Corrigan, PT, DPT, CSRS 06/14/2019, 7:53 AM

## 2019-06-15 ENCOUNTER — Inpatient Hospital Stay (HOSPITAL_COMMUNITY): Payer: BC Managed Care – PPO | Admitting: Physical Therapy

## 2019-06-15 ENCOUNTER — Inpatient Hospital Stay (HOSPITAL_COMMUNITY): Payer: BC Managed Care – PPO | Admitting: Speech Pathology

## 2019-06-15 ENCOUNTER — Inpatient Hospital Stay (HOSPITAL_COMMUNITY): Payer: BC Managed Care – PPO

## 2019-06-15 ENCOUNTER — Other Ambulatory Visit: Payer: Self-pay

## 2019-06-15 DIAGNOSIS — D638 Anemia in other chronic diseases classified elsewhere: Secondary | ICD-10-CM

## 2019-06-15 LAB — GLUCOSE, CAPILLARY
Glucose-Capillary: 142 mg/dL — ABNORMAL HIGH (ref 70–99)
Glucose-Capillary: 168 mg/dL — ABNORMAL HIGH (ref 70–99)
Glucose-Capillary: 157 mg/dL — ABNORMAL HIGH (ref 70–99)
Glucose-Capillary: 142 mg/dL — ABNORMAL HIGH (ref 70–99)

## 2019-06-15 NOTE — Progress Notes (Signed)
Occupational Therapy Session Note  Patient Details  Name: [name] MRN: [id number] Date of Birth: [birth year]  Today's Date: 06/15/2019 OT Individual Time: 1045-1200 OT Individual Time Calculation (min): 75 min    Short Term Goals: Week 1:  OT Short Term Goal 1 (Week 1): Pt will complete BSC or toilet transfer with Min A using LRAD OT Short Term Goal 2 (Week 1): Pt will complete LB dressing with Min A OT Short Term Goal 3 (Week 1): Pt will complete 1 grooming task while standing at the sink to improve standing endurance      Skilled Therapeutic Interventions/Progress Updates:    Pt received in bed with no c/o pain and ready for tx. Pt comes EOB with (S) and completes functional ambulation with RW and CGA to shower. Pt completes UB bathing with (S) and LB bathing with CGA and min VC to use grab bars while standing to wash buttocks. Pt completes UB dressing with (S) and LB dressing with min A for threading food into pants. Pt is total A for donning TEDS and (S) for donning shoes. Pt completes oral care while standing at the sink with (S) and RW in front of pt for support. Pt transported to therapy apartment to practice and simulate TTB use in preparation for d/c to home environment. OT demonstrated proper and safe use; pt able to return demonstrate with min VC for hand placement. Exited session with pt in w/c, safety belt on and all needs met.   Therapy Documentation Precautions:  Precautions Precautions: Fall Restrictions Weight Bearing Restrictions: No      Therapy/Group: Individual Therapy  Olivia Ward 06/15/2019, 12:10 PM

## 2019-06-15 NOTE — Patient Care Conference (Signed)
Inpatient RehabilitationTeam Conference and Plan of Care Update Date: 06/14/2019   Time: 10:15 am    Patient Name: [name]      Medical Record Number: [id number]  Date of Birth: [birth year] Sex: Female         Room/Bed: 4W25C/4W25C-01 Payor Info: Payor: BLUE [employer] BLUE [employer] / Plan: [employer] COMM PPO / Product Type: *No Product type* /    Admit Date/Time:  06/11/2019  4:15 PM  Primary Diagnosis:  Cerebral ventriculitis  Patient Active Problem List   Diagnosis Date Noted  . CKD (chronic kidney disease), stage II   . Transaminitis   . Controlled type 2 diabetes mellitus with hyperglycemia, without long-term current use of insulin (HCC)   . Benign essential HTN   . Anemia   . 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 Communicating hydrocephalus (HCC)   . S/P VP shunt   . Diabetes mellitus type 2 in obese (HCC)   . Acute blood loss anemia   . Elevated BUN 05/12/2019  . Elevated serum creatinine 05/12/2019  . Pituitary tumor 05/12/2019  . Vitamin D deficiency 05/12/2019  . DJD (degenerative joint disease) 05/12/2019  . 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 Febrile illness   . AKI (acute kidney injury) (HCC) 10/30/2018  . Pituitary adenoma (HCC) 10/30/2018  . Acute bacterial meningitis 10/30/2018  . Sepsis (HCC) 10/30/2018  . Headache 10/29/2018  . Acute encephalopathy 10/29/2018  . Rhabdomyolysis 10/29/2018  . History of pituitary adenoma 10/29/2018  . Glaucoma 10/29/2018  . Atherosclerosis 10/29/2018  . Acute purulent meningitis 10/29/2018  . CKD (chronic kidney disease), stage III 10/29/2018  . Meningitis 10/29/2018  . Fever 10/29/2018  . Malignant neoplasm of lower-outer quadrant of right breast of female, estrogen receptor negative (HCC) 06/09/2016  . Morbid obesity with BMI of 40.0-44.9, adult (HCC) 05/25/2015  . Type II diabetes mellitus, uncontrolled (HCC) 05/25/2015  . Normocytic anemia 04/27/2014  . Hyperlipidemia, unspecified 10/22/2011   . Essential hypertension with goal blood pressure less than 130/85 10/22/2011  . DM (diabetes mellitus) (HCC) 10/22/2011  . HTN (hypertension)   . Diabetes mellitus type II, non insulin dependent (HCC)   . Dyslipidemia     Expected Discharge Date: Expected Discharge Date: 06/21/19  Team Members Present: Physician leading conference: Dr. Genice Rouge Social Worker Present: Amada Jupiter, LCSW Nurse Present: Greta Doom, RN PT Present: Aleda Grana, PT OT Present: Jake Shark, OT SLP Present: Feliberto Gottron, SLP PPS Coordinator present : Fae Pippin, SLP     Current Status/Progress Goal Weekly Team Focus  Bowel/Bladder   Continent of B&B. LBM 10/5  remain continent  assess and monitor q shift   Swallow/Nutrition/ Hydration             ADL's   Min A LB dressing, (S) UB dressing, CGA transfers, cueing required for sequencing  Supervision overall  cognitive retraining, ADLs, functional standing balance, ADL transfers   Mobility   min assist gait with RW, min assist stairs with B rails, supervision bed mobility with hospital bed features  supervision overall with LRAD  gait, transfers, balance, endurance, stair negotiation, d/c planning   Communication   min A word finding abstract thought  supervision A  word finding strategies in conversation and higher level word fidning tasks   Safety/Cognition/ Behavioral Observations  max A complex problem solving, Min A recall, selective attention and anticipatory awareness  Mod A complex problem solving, Sup A recall, attention and awareness  Complex problem solving (medication/money  management) executive function skills, compensatory aid for recall within tasks, selective attention and anticipatory awareness   Pain   no complaints of pain  remain free of pain  assess and monitor q shift   Skin   sutures on head OTA. Abdominal wounds 1 with skin glue and 1 dehist with guaze and aquacel dressing.  Continue daily dressing changes as  ordered to promote healing and repairing skin integrity  assess and monitor q shift.      *See Care Plan and progress notes for long and short-term goals.     Barriers to Discharge  Current Status/Progress Possible Resolutions Date Resolved   Nursing                  PT  Decreased caregiver support  unsure if family can provide physical assist at d/c              OT Decreased caregiver support  Sister coming to stay?             SLP                SW                Discharge Planning/Teaching Needs:  Home with family sharing in provision of 24/7 support  Teaching needs TBD   Team Discussion:  Multiple medical issues;  VP shunt;  Cont b/b.  Incision good.  Supervision to min with ADLs and mobility with supervision goals.  Mod - max complex prob solving.  Revisions to Treatment Plan:  NA    Medical Summary Current Status: continent except accident x1 this AM- urgency- slowed initiation and processing- mod-max SLP Weekly Focus/Goal: initiation and processing  Barriers to Discharge: Behavior;Medication compliance;Other (comments);IV antibiotics  Barriers to Discharge Comments: many complications Possible Resolutions to Barriers: off ABX   Continued Need for Acute Rehabilitation Level of Care: The patient requires daily medical management by a physician with specialized training in physical medicine and rehabilitation for the following reasons: Direction of a multidisciplinary physical rehabilitation program to maximize functional independence : Yes Medical management of patient stability for increased activity during participation in an intensive rehabilitation regime.: Yes Analysis of laboratory values and/or radiology reports with any subsequent need for medication adjustment and/or medical intervention. : Yes   I attest that I was present, lead the team conference, and concur with the assessment and plan of the team.   Gaylen Pereira 06/15/2019, 12:13 PM

## 2019-06-15 NOTE — Evaluation (Signed)
Recreational Therapy Assessment and Plan  Patient Details  Name: [name] MRN: [id number] Date of Birth: 1956/07/09 Today's Date: 06/15/2019  Rehab Potential: Good ELOS: 10 days   Assessment  Problem List:      Patient Active Problem List   Diagnosis Date Noted  . 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 S/P VP shunt   . Diabetes mellitus type 2 in obese ([place])   . Acute blood loss anemia   . Elevated BUN 05/12/2019  . Elevated serum creatinine 05/12/2019  . Pituitary tumor 05/12/2019  . Vitamin D deficiency 05/12/2019  . DJD (degenerative joint disease) 05/12/2019  . 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 Febrile illness   . AKI (acute kidney injury) ([place]) 10/30/2018  . Pituitary adenoma ([place]) 10/30/2018  . Acute bacterial meningitis 10/30/2018  . Sepsis ([place]) 10/30/2018  . Headache 10/29/2018  . Acute encephalopathy 10/29/2018  . Rhabdomyolysis 10/29/2018  . History of pituitary adenoma 10/29/2018  . Glaucoma 10/29/2018  . Atherosclerosis 10/29/2018  . Acute purulent meningitis 10/29/2018  . CKD (chronic kidney disease), stage III 10/29/2018  . Meningitis 10/29/2018  . Fever 10/29/2018  . Malignant neoplasm of lower-outer quadrant of right breast of female, estrogen receptor negative ([place]) 06/09/2016  . Morbid obesity with BMI of 40.0-44.9, adult ([place]) 05/25/2015  . Type II diabetes mellitus, uncontrolled ([place]) 05/25/2015  . Normocytic anemia 04/27/2014  . Hyperlipidemia, unspecified 10/22/2011  . Essential hypertension with goal blood pressure less than 130/85 10/22/2011  . DM (diabetes mellitus) ([place]) 10/22/2011  . HTN (hypertension)   . Diabetes mellitus type II, non insulin dependent ([place])   . Dyslipidemia     Past Medical History:      Past Medical History:  Diagnosis Date  . Anemia   . Breast cancer ([place]) 12/29/13   right 6:00 o'clock, lower outer  . Cataract of left eye    . Diabetes mellitus    type 2  . Family history of malignant neoplasm of breast   . Glaucoma    MD just watching, no eye drops  . High cholesterol   . History of blood transfusion 2015   with chemo treatments  . History of radiation therapy 08/12/14- 10/11/14   right breast /50.4 Gy/28 fx, right breast boost/ 10 Gy/ 5 fx  . HTN (hypertension)   . Personal history of chemotherapy   . Personal history of radiation therapy   . Pituitary adenoma ([place]) 10/30/2018  . Wears dentures    full top-partial bottom  . Wears dentures    full  . Wears glasses   . Wears glasses    Past Surgical History:       Past Surgical History:  Procedure Laterality Date  . ABDOMINAL HYSTERECTOMY  1993   endometriosis  . BREAST BIOPSY Left 12/29/2013  . BREAST LUMPECTOMY Right 02/08/2014  . BREAST LUMPECTOMY WITH AXILLARY LYMPH NODE BIOPSY Right 02/08/14   invasive ductal  . CRANIOTOMY N/A 04/15/2019   Procedure: Transphenoid resection of pituitary tumor with fat graft;  Surgeon: Judith Part, MD;  Location: [place];  Service: Neurosurgery;  Laterality: N/A;  . IR IMAGE GUIDED FLUID DRAIN BY CATHETER  04/19/2019  . MULTIPLE TOOTH EXTRACTIONS    . PLACEMENT OF LUMBAR DRAIN N/A 04/24/2019   Procedure: PLACEMENT OF LUMBAR DRAIN;  Surgeon: Eustace [place], MD;  Location: [place];  Service: Neurosurgery;  Laterality: N/A;  . PLACEMENT OF LUMBAR DRAIN N/A 05/16/2019  Procedure: PLACEMENT OF LUMBAR DRAIN;  Surgeon: Newman Pies, MD;  Location: [place];  Service: Neurosurgery;  Laterality: N/A;  PLACEMENT OF LUMBAR DRAIN  . PORT-A-CATH REMOVAL    . PORTACATH PLACEMENT Left 02/08/2014   Procedure: INSERTION PORT-A-CATH;  Surgeon: Rolm Bookbinder, MD;  Location: [place];  Service: General;  Laterality: Left;  . REPAIR OF CEREBROSPINAL FLUID LEAK N/A 04/29/2019   Procedure: Endoscopic Endonasal Cerebrospinal Fluid Leak repair, Abdominal wound revision and fat graft  harvest, Intrathecal Fluorocein Injection;  Surgeon: Judith Part, MD;  Location: [place];  Service: Neurosurgery;  Laterality: N/A;  . TONSILLECTOMY    . TRANSNASAL APPROACH N/A 04/15/2019   Procedure: TRANSNASAL APPROACH;  Surgeon: Jerrell [place], MD;  Location: [place];  Service: ENT;  Laterality: N/A;  . TRANSPHENOIDAL APPROACH EXPOSURE N/A 04/15/2019   Procedure: ENDOSCOPIC TRANS NASAL APPROACH WITH FUSIONN;  Surgeon: Jerrell [place], MD;  Location: [place];  Service: ENT;  Laterality: N/A;  . TRANSPHENOIDAL APPROACH EXPOSURE N/A 04/29/2019   Procedure: Endoscopic repair of spinal fluid leak ;  Surgeon: Jerrell [place], MD;  Location: [place];  Service: ENT;  Laterality: N/A;  . TRANSPHENOIDAL PITUITARY RESECTION  2006  . VENTRICULOPERITONEAL SHUNT Right 05/31/2019   Procedure: SHUNT INSERTION VENTRICULAR-PERITONEAL; REMOVAL OF LUMBAR DRAIN;  Surgeon: Judith Part, MD;  Location: [place];  Service: Neurosurgery;  Laterality: Right;  SHUNT INSERTION VENTRICULAR-PERITONEAL; REMOVAL OF LUMBAR DRAIN  . WISDOM TOOTH EXTRACTION      Assessment & Plan Clinical Impression: Patient is a 63 y.o. 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 She was admitted on 04/15/19 for repeat endoscopic transphenoidal resection of tumor by Drs. Ostegard and [employer].Post op course complicate by fevers with confusion, cognitive deficits and visual changes. She also had nasal drainage with activity concerning for leak therefore taken to OR for placement of lumbar drain on 04/24/19. Follow up CT head on 08/19 showedlarge pneumocephalus with mass-effect on right frontal lobe and diffuse cerebral sulcal effacement as well as posterior displacement of brainstem. This was thought to be secondary to drain therefore  drain was clamped and she was taken back to the OR on 8/21 for repair of CSF leak withabdominal wound revision. On 8/26, she developed recurrent fever with tachycardia and was started on empiric antibiotics. She had recurrent nasal drainage with clear attempts at clamping lumbar drain therefore was placed on bedrest. On 09/06, she developed hypoxia with fever and tachycardia and declining mental status. CSF smear showed 830 WBCs with protein elevated at 448. ID consulted for input and she was started on Vanco and cefepime for bacterial meningitis.  She continued to have fevers and antibiotics caused brought in. CSF cultures and blood cultures were negative and INT recommended ENT evaluation as well as shunt revision. Dr. Venetia Constable felt that changes were due to ventriculitis and recommended monitoring. She developed lethargy with attempts at lumbar drain clamping.. She continued to have decline in mental status with CT of head showing concerns of hydrocephalus. Antibiotics was discontinued on zero 9/18 as she had been afebrile x24 hours. She was taken to the OR on 922 for removal of lumbar drain and placement of VP shunt. She tolerated extubation without difficulty and mentation has been stable. No recurrent fevers noted. Follow-up CT head showed continued enlargement of ventricles therefore shunt was dialed down  to 2.0 on 09/25. Therapy resumed and patient is making progress with therapy. She continues have balance deficits with shuffling,requires cues for sequencing with task as well as some dizziness with activity. CIR recommended due to functional decline.  Patient transferred to CIR on 06/11/2019 .   Pt presents with decreased activity tolerance, decreased functional mobility, decreased balance, decreased attention, decreased safety awareness, decreased leisure awareness Limiting pt's independence with leisure/community pursuits.  Leisure History/Participation Premorbid leisure  interest/current participation: Medical laboratory scientific officer - Programmer, multimedia (Comment) Psychosocial / Spiritual Spiritual Interests: Social worker Social interaction - Mood/Behavior: Cooperative Engineer, drilling for Education?: Yes Strengths/Weaknesses Patient Strengths/Abilities: Willingness to participate;Active premorbidly Patient weaknesses: Physical limitations TR Patient demonstrates impairments in the following area(s): Endurance;Motor;Safety  Plan Rec Therapy Plan Is patient appropriate for Therapeutic Recreation?: Yes Rehab Potential: Good Treatment times per week: MIn 1 TR session >20 minutes during LOS Estimated Length of Stay: 10 days TR Treatment/Interventions: Adaptive equipment instruction;Community reintegration;Patient/family education;Therapeutic exercise;1:1 session;Functional mobility training;Balance/vestibular training;Recreation/leisure participation;Cognitive remediation/compensation;Leisure education;Therapeutic activities  Recommendations for other services: None   Discharge Criteria: Patient will be discharged from TR if patient refuses treatment 3 consecutive times without medical reason.  If treatment goals not met, if there is a change in medical status, if patient makes no progress towards goals or if patient is discharged from hospital.  The above assessment, treatment plan, treatment alternatives and goals were discussed and mutually agreed upon: by patient  [place] 06/15/2019, 2:27 PM

## 2019-06-15 NOTE — Progress Notes (Signed)
Physical Therapy Session Note  Patient Details  Name: [name] MRN: [id number] Date of Birth: March 26, 1956  Today's Date: 06/15/2019 PT Individual Time: [id number] PT Individual Time Calculation (min): 69 min   Short Term Goals: Week 1:  PT Short Term Goal 1 (Week 1): STG=LTG due to ELOS  Skilled Therapeutic Interventions/Progress Updates:  Pt received in w/c & agreeable to tx. Pt transfers sit>stand with supervision & ambulates in room/bathroom with RW & CGA. Pt manages clothing & completes toilet transfer with RW & grab bar with CGA for balance & transfer. Pt with continent void on toilet & performs peri hygiene without assistance. Pt performs hand hygiene standing at sink with supervision. In dayroom, pt ambulates 90 ft x 2 with RW & CGA progressing to close supervision with pt reporting "that knee is holding up better today" with pt taking seated rest break between each trial 2/2 fatigue. Pt negotiates obstacle course (weaving between cones, stepping over poles, negotiating uneven surface) with RW & CGA x 2 trials with max cuing for RW & foot approximation & stepping over poles as during first trial pt frequently stepping on poles with heels, with improving clearance with ongoing cuing for 2nd trial. Pt with c/o dizziness when turning on compliant surface & requires cuing x 1 time to ambulate within base of RW. Pt reports L knee pain after 2nd obstacle course trial with rest break provided for pain management.  Pt with ongoing c/o pain in L knee, unrated, & pains meds requested from RN. Pt performed BLE standing therapeutic exercises with UE support on RW and instructional cuing for technique with exercises focusing on BLE strengthening. Exercises included: marches, heel raises (minimal ROM noted during exercise), and mini squats, all 2 sets x 15 reps. At end of session pt left in w/c with chair alarm donned & all needs in reach.    Therapy Documentation Precautions:   Precautions Precautions: Fall Restrictions Weight Bearing Restrictions: No     Therapy/Group: Individual Therapy  Waunita Schooner 06/15/2019, 3:39 PM

## 2019-06-15 NOTE — Progress Notes (Signed)
Speech Language Pathology Daily Session Note  Patient Details  Name: [name] MRN: [id number] Date of Birth: 11-Feb-1956  Today's Date: 06/15/2019 SLP Individual Time: 0830-0930 SLP Individual Time Calculation (min): 60 min  Short Term Goals: Week 1: SLP Short Term Goal 1 (Week 1): Pt will complete complex problem solving tasks with mod A verbal cues for executive function skills (planning, organizing. ..etc.) SLP Short Term Goal 2 (Week 1): Pt will anticipate barriers to discharge and generate appropriate solutions with supervision A verbal cues. SLP Short Term Goal 3 (Week 1): Pt will demonstrate selective attention durng problem solving tasks in 30 minute intervals with supervision A verbal cues for redirection. SLP Short Term Goal 4 (Week 1): Pt will utilizie compensatory aids to recall novel, daily information with supervision A verbal cues. SLP Short Term Goal 5 (Week 1): Pt will complete higher level word finding and generative naming tasks with supervision A semantic cues.  Skilled Therapeutic Interventions: Pt was seen for skilled ST targeting cognitive goals. Pt verbally recalled function of 1 out of 6 current medications with Mod A question cues, however Total A required to recall functions of remaining 5. She also required Max faded to Mod A verbal and visual cues for organization, error awareness as well as problem solving correction of errors when setting up a BID pill box from list of current medications (complex medication management task). Pt also demonstrated difficulty with mental flexibility, evidenced in her difficulty to alternate between setting up daily vs 2X daily dosages in pill box. SLP also introduced compensatory memory strategies and handout provided (WRAP). Prior to end of session, pt demonstrated significant concern for safety awareness, as she stated she was going to get up and put clothes on, etc. after this SLP left the room. SLP instructed pt not to do so,  as she currently requires assistance with all ADLs such as walking, dressing, grooming, etc. SLP assisted pt in transitioning from sitting at edge of bed to laying in bed with Min-Mod A verbal cues for sequencing. Pt was left in bed with alarm set and call bell within reach, SLP re-emphasized need to call for assistance throughout day and alerted RN to pt's comments that caused SLPs concern for decreased safety awareness. Continue per current plan of care.      Pain Pain Assessment Pain Scale: 0-10 Pain Score: 0-No pain  Therapy/Group: Individual Therapy  Arbutus Leas 06/15/2019, 10:16 AM

## 2019-06-15 NOTE — Progress Notes (Signed)
[facility] PHYSICAL MEDICINE & REHABILITATION PROGRESS NOTE   Subjective/Complaints: Patient seen sitting up in the bed this morning which is reducible overnight.  She states she is doing well with therapies.  ROS: Denies CP, SOB, N/V/D  Objective:   No results found. Recent Labs    06/13/19 1005  WBC 6.6  HGB 7.9*  HCT 25.8*  PLT 228   Recent Labs    06/13/19 1005  NA 137  K 3.7  CL 105  CO2 20*  GLUCOSE 165*  BUN 6*  CREATININE 1.06*  CALCIUM 8.9    Intake/Output Summary (Last 24 hours) at 06/15/2019 1639 Last data filed at 06/15/2019 1331 Gross per 24 hour  Intake 1200 ml  Output -  Net 1200 ml     Physical Exam: Vital Signs Blood pressure 108/69, pulse 83, temperature 98.4 F (36.9 C), resp. rate 18, height 5\' 6"  (1.676 m), weight 99.5 kg, SpO2 100 %. Constitutional: No distress . Vital signs reviewed. HENT: Normocephalic.  Atraumatic. Eyes: EOMI. No discharge. Cardiovascular: No JVD. Respiratory: Normal effort.  No stridor. GI: Non-distended. Skin: Right lower quadrant breast not examined today. Psych: Normal mood.  Normal behavior. Musc: No edema in extremities.  No tenderness in extremities. Neuro: Alert Motor: 4+-5/5 throughout, stable  Assessment/Plan: 1. 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  Physiatrist is providing close team supervision and 24 hour management of active medical problems listed below.  Physiatrist and rehab team continue to assess barriers to discharge/monitor patient progress toward functional and medical goals  Care Tool:  Bathing    Body parts bathed by patient: Chest, Right arm, Left arm, Abdomen, Front perineal area, Face, Buttocks, Right upper leg, Left upper leg, Right lower leg, Left lower leg     Body parts n/a:  Buttocks, Front perineal area   Bathing assist Assist Level: Contact Guard/Touching assist     Upper Body Dressing/Undressing Upper body dressing   What is the patient wearing?: Pull over shirt, Bra    Upper body assist Assist Level: Supervision/Verbal cueing    Lower Body Dressing/Undressing Lower body dressing      What is the patient wearing?: Pants, Underwear/pull up     Lower body assist Assist for lower body dressing: Minimal Assistance - Patient > 75%     Toileting Toileting    Toileting assist Assist for toileting: Contact Guard/Touching assist     Transfers Chair/bed transfer  Transfers assist     Chair/bed transfer assist level: Contact Guard/Touching assist     Locomotion Ambulation   Ambulation assist      Assist level: Contact Guard/Touching assist Assistive device: Walker-rolling Max distance: 90 ft   Walk 10 feet activity   Assist     Assist level: Contact Guard/Touching assist Assistive device: Walker-rolling   Walk 50 feet activity   Assist    Assist level: Contact Guard/Touching assist Assistive device: Walker-rolling    Walk 150 feet activity   Assist Walk 150 feet activity did not occur: Safety/medical concerns         Walk 10 feet on uneven surface  activity   Assist Walk 10 feet on uneven surfaces activity did not occur: Safety/medical concerns   Assist level: Contact Guard/Touching assist Assistive device: Chemical engineer     Assist Will patient use wheelchair at discharge?: No  Wheelchair 50 feet with 2 turns activity    Assist            Wheelchair 150 feet activity     Assist          Blood pressure 108/69, pulse 83, temperature 98.4 F (36.9 C), resp. rate 18, height 5\' 6"  (1.676 m), weight 99.5 kg, SpO2 100 %.  Medical Problem List and Plan:  1. Impaired ADLs/mobility/cognition/function secondary to sellar adenoma transphenoidal resection  04/15/19 s/p multiple unforeseen complications including bacterial meningitis, VP shunt placement due to hydrocephalus, CSF leak, and ventriculitis. Ordering PT/OT and SLP for functional deficits.   Continue CIR 2. Antithrombotics:   -DVT/anticoagulation: Pharmaceutical: Lovenox   -antiplatelet therapy: N/A  3. Pain Management: Tylenol as needed- doesn't complain of pain.  4. Mood: LCSW to follow for evaluation and support   -antipsychotic agents:N/A  5. Neuropsych: This patient is ?capable of making decisions on own behalf. Need to determine who makes decisions for pt.  6. Skin/Wound Care: Continue Aquacel with dry dressing for RLQ wound. Change daily. Continue to offer nutritional supplements with meals as p.o. intake is poor  7. Fluids/Electrolytes/Nutrition: Discontinued IV fluids. Monitor intake/output.  8. HTN: Monitor blood pressures. Continue amlodipine daily   Controlled on 10/7 9. Anemia of chronic disease: Continue iron supplement twice daily  CBG (last 3)  Recent Labs    06/14/19 2109 06/15/19 0617 06/15/19 1229  GLUCAP 132* 142* 168*   Hb 7.9 on 10/5, cont to monitor 10. T2DM with hyperglycemia: Was on glyburide PTA. Will continue to monitor blood sugars achs.   Hemoglobin A1c 7.4  Con't SSI  Glyburide 1.25 ordered on 10/6-9 given?  Due to timing, not given on 10/7?  Due to availability-will discuss with nursing  Remains elevated on 10/7 11. Hydrocephalus s/p VP shunt and previous ventriculitis.: Monitor for fevers or recurrent drainage with increase in activity.  12. Impaired vision- will try to determine how much vision loss and if acute vs more chronic.  13. Impaired cognition- SLP for cognition, esp for word finding deficits and overall cognition.   14. Transaminitis  AST mildly elevated on 10/5, cont to monitor 15. CKD stage II  Cr.  1.06 on 10/5  Cont to monitor  LOS: 4 days A FACE TO FACE EVALUATION WAS PERFORMED  [name] Lorie Phenix 06/15/2019, 4:39 PM

## 2019-06-16 ENCOUNTER — Inpatient Hospital Stay (HOSPITAL_COMMUNITY): Payer: BC Managed Care – PPO | Admitting: Rehabilitation

## 2019-06-16 ENCOUNTER — Inpatient Hospital Stay (HOSPITAL_COMMUNITY): Payer: BC Managed Care – PPO

## 2019-06-16 ENCOUNTER — Inpatient Hospital Stay: Payer: BC Managed Care – PPO | Admitting: Oncology

## 2019-06-16 ENCOUNTER — Inpatient Hospital Stay (HOSPITAL_COMMUNITY): Payer: BC Managed Care – PPO | Admitting: *Deleted

## 2019-06-16 ENCOUNTER — Inpatient Hospital Stay: Payer: BC Managed Care – PPO

## 2019-06-16 ENCOUNTER — Inpatient Hospital Stay (HOSPITAL_COMMUNITY): Payer: BC Managed Care – PPO | Admitting: Speech Pathology

## 2019-06-16 LAB — GLUCOSE, CAPILLARY
Glucose-Capillary: 134 mg/dL — ABNORMAL HIGH (ref 70–99)
Glucose-Capillary: 140 mg/dL — ABNORMAL HIGH (ref 70–99)
Glucose-Capillary: 112 mg/dL — ABNORMAL HIGH (ref 70–99)

## 2019-06-16 NOTE — Progress Notes (Signed)
Social Work Patient ID: [name], female   DOB: [birth year], 63 y.o.   MRN: [id number]  Have reviewed team conference with pt who is aware and agreeable with targeted d/c date of 10/13 and supervision goals.  Still need to contact daughter.  Continue to follow.  Rue Valladares, LCSW

## 2019-06-16 NOTE — Progress Notes (Signed)
Physical Therapy Session Note  Patient Details  Name: [name] MRN: [id number] Date of Birth: [birth year]  Today's Date: 06/16/2019 PT Individual Time: [id number] PT Individual Time Calculation (min): 57 min   Short Term Goals: Week 1:  PT Short Term Goal 1 (Week 1): STG=LTG due to ELOS  Skilled Therapeutic Interventions/Progress Updates:   Pt received lying in bed, agreeable to therapy session.  Performed bed mobility at mod I level with use of bed functions.  Once at EOB, PT assisted with donning shoes.  Pt requesting to use restroom, prior to leaving room, therefore ambulated with RW to restroom at [facility] for safety with min cues for increased step length B.  Pt able to perform all aspects of toileting at S level with single UE support at all times on RW.  While pt toileting, discussed D/C plans for home, home set up and any concerns with returning home on Tuesday.  Pt verbalized understanding of education. Pt then ambulated to sink with RW at S level with cues for keeping RW with her and stepping into RW to sink for safety.  Stood x 3-4 mins while washing hands, washing face.  Pt unwilling to ambulate towards gym, therefore PT assisted to therapy gym (day room) at total A for time management.  Once in therapy gym, performed seated nustep x 2 mins at level 4 resistance with BUEs/LEs with cues to maintain rpm in 40's then x 4 more minutes with LEs only at level 3 resistance maintaining rpm in 40's.  Pt reports some L knee pain, but is able to continue with task.  Ambulated x 80' around day room and back to therapy mat at [facility] level with antalgic gait noted with cues for upright posture and safe positioning of RW.  Once at therapy mat, performed sit<>stand from elevated mat x 5 reps without UE support (reaching forward to encourage forward weight shift), lowered mat slightly and performed another 5 reps.  Cued to control descent.  Standing marching attempted, however due to knee pain was unable to  continue, therefore performed mini squats x 5 reps with cues for safe technique.  Pt transferred back to w/c with RW at [facility] level and assisted back to room.  Pt ambulated x 15' to recliner as above and left with chair alarm set and all needs in reach.   Therapy Documentation Precautions:  Precautions Precautions: Fall Restrictions Weight Bearing Restrictions: No Pain: Pain Assessment Pain Score: 0-No pain    Therapy/Group: Individual Therapy  Denice Bors 06/16/2019, 10:02 AM

## 2019-06-16 NOTE — Plan of Care (Signed)
  Problem: Consults Goal: RH BRAIN INJURY PATIENT EDUCATION Description: Description: See Patient Education module for eduction specifics Outcome: Progressing   Problem: RH SKIN INTEGRITY Goal: RH STG SKIN FREE OF INFECTION/BREAKDOWN Outcome: Progressing Goal: RH STG MAINTAIN SKIN INTEGRITY WITH ASSISTANCE Description: STG Maintain Skin Integrity With mod I Assistance. Outcome: Progressing Goal: RH STG ABLE TO PERFORM INCISION/WOUND CARE W/ASSISTANCE Description: STG Able To Perform Incision/Wound Care With min Assistance. Outcome: Progressing   Problem: RH SAFETY Goal: RH STG ADHERE TO SAFETY PRECAUTIONS W/ASSISTANCE/DEVICE Description: STG Adhere to Safety Precautions With min Assistance/Device. Outcome: Progressing   Problem: RH COGNITION-NURSING Goal: RH STG ANTICIPATES NEEDS/CALLS FOR ASSIST W/ASSIST/CUES Description: STG Anticipates Needs/Calls for Assist With mod I Assistance/Cues. Outcome: Progressing   Problem: RH PAIN MANAGEMENT Goal: RH STG PAIN MANAGED AT OR BELOW PT'S PAIN GOAL Description: Less than 3 out of 10 Outcome: Progressing

## 2019-06-16 NOTE — Progress Notes (Signed)
Speech Language Pathology Daily Session Note  Patient Details  Name: KARAN HOLLAWAY MRN: [id number] Date of Birth: 03-Sep-1956  Today's Date: 06/16/2019 SLP Individual Time: 0729-0827 SLP Individual Time Calculation (min): 58 min  Short Term Goals: Week 1: SLP Short Term Goal 1 (Week 1): Pt will complete complex problem solving tasks with mod A verbal cues for executive function skills (planning, organizing. ..etc.) SLP Short Term Goal 2 (Week 1): Pt will anticipate barriers to discharge and generate appropriate solutions with supervision A verbal cues. SLP Short Term Goal 3 (Week 1): Pt will demonstrate selective attention durng problem solving tasks in 30 minute intervals with supervision A verbal cues for redirection. SLP Short Term Goal 4 (Week 1): Pt will utilizie compensatory aids to recall novel, daily information with supervision A verbal cues. SLP Short Term Goal 5 (Week 1): Pt will complete higher level word finding and generative naming tasks with supervision A semantic cues.  Skilled Therapeutic Interventions: Pt was seen for skilled ST targeting cognitive goals. Provided Min A question cues for use of external aid (compensatory memory strategy) pt recalled 4 specific compensatory memory strategies (WRAP). SLP further facilitated session with semi-complex monthly calendar scheduling task, which pt completed with Min faded to Supervision A verbal cues for organization, attention to detail, and error awareness. Pt exhibited improved safety awareness in functional conversation regarding therapy safety precautions and discharge; pt identified 3 concrete tasks she should not complete in the hospital or at home without assistance. She also verbally acknowledged agreement with SLP regarding recommendation for 24/7 supervision both for mobility and complex cognition (ex: medication and finance management, cooking, etc.). Pt was left laying in bed with alarm set and all needs within reach.  Continue per current plan of care.    Pain Pain Assessment Pain Score: 0-No pain  Therapy/Group: Individual Therapy  Arbutus Leas 06/16/2019, 9:49 AM

## 2019-06-16 NOTE — Progress Notes (Signed)
Occupational Therapy Session Note  Patient Details  Name: [name] MRN: [id number] Date of Birth: 1955-12-15  Today's Date: 06/16/2019 OT Individual Time: 1330-1445 OT Individual Time Calculation (min): 75 min    Short Term Goals: Week 1:  OT Short Term Goal 1 (Week 1): Pt will complete BSC or toilet transfer with Min A using LRAD OT Short Term Goal 2 (Week 1): Pt will complete LB dressing with Min A OT Short Term Goal 3 (Week 1): Pt will complete 1 grooming task while standing at the sink to improve standing endurance      Skilled Therapeutic Interventions/Progress Updates:    Pt received in recliner, eating lunch with no c/o pain. Agreeable for tx with OT. Pt completes sit <>stand with RW with CGA and transported in w/c to therapy gym. Pt completes graded pipe tree to address cognition and dynamic sitting and standing balance; 1 trial in sitting and 1 trial in standing with (S).  Required mod VC for problem solving and sustained attention to task. Pt completes shower transfer with RW and (S) and completes UB and LB bathing with (S) and min VC to use grab bars while standing to wash buttocks for safety awareness. Pt completes UB dressing with (S) and LB dressing with min A d/t time constraint and min VC to thread pants into legs and pull completely over heel of foot. Exited session with pt in w/c, chair alarm set and all needs met.    Therapy Documentation Precautions:  Precautions Precautions: Fall Restrictions Weight Bearing Restrictions: No         Therapy/Group: Individual Therapy    06/16/2019, 2:44 PM

## 2019-06-16 NOTE — Care Management (Signed)
[facility] Individual Statement of Services  Patient Name:  [name]  Date:  06/16/2019  Welcome to the [facility].  Our goal is to provide you with an individualized program based on your diagnosis and situation, designed to meet your specific needs.  With this comprehensive rehabilitation program, you will be expected to participate in at least 3 hours of rehabilitation therapies Monday-Friday, with modified therapy programming on the weekends.  Your rehabilitation program will include the following services:  Physical Therapy (PT), Occupational Therapy (OT), Speech Therapy (ST), 24 hour per day rehabilitation nursing, Therapeutic Recreaction (TR), Neuropsychology, Case Management (Social Worker), Rehabilitation Medicine, Nutrition Services and Pharmacy Services  Weekly team conferences will be held on Tuesdays to discuss your progress.  Your Social Worker will talk with you frequently to get your input and to update you on team discussions.  Team conferences with you and your family in attendance may also be held.  Expected length of stay: 10-12 days   Overall anticipated outcome: supervision  Depending on your progress and recovery, your program may change. Your Social Worker will coordinate services and will keep you informed of any changes. Your Social Worker's name and contact numbers are listed  below.  The following services may also be recommended but are not provided by the [facility] will be made to provide these services after discharge if needed.  Arrangements include referral to agencies that provide these services.  Your insurance has been verified to be:  [place] Your primary doctor is:  Management consultant  Pertinent information will be shared with your doctor and your  insurance company.  Social Worker:  [place], [place] or [phone]   Information discussed with and copy given to patient by: Lennart Pall, 06/16/2019, 3:09 PM

## 2019-06-16 NOTE — Progress Notes (Signed)
[facility] PHYSICAL MEDICINE & REHABILITATION PROGRESS NOTE   Subjective/Complaints: Patient seen sitting up in bed this morning.  He states he slept well overnight.  He believes that her cognition has improved to about 90%.  ROS: Denies CP, SOB, N/V/D  Objective:   No results found. No results for input(s): WBC, HGB, HCT, PLT in the last 72 hours. No results for input(s): NA, K, CL, CO2, GLUCOSE, BUN, CREATININE, CALCIUM in the last 72 hours.  Intake/Output Summary (Last 24 hours) at 06/16/2019 1123 Last data filed at 06/15/2019 1849 Gross per 24 hour  Intake 480 ml  Output -  Net 480 ml     Physical Exam: Vital Signs Blood pressure 124/73, pulse 98, temperature 98 F (36.7 C), resp. rate 18, height 5\' 6"  (1.676 m), weight 99.5 kg, SpO2 100 %. Constitutional: No distress . Vital signs reviewed. HENT: Normocephalic.  Atraumatic. Eyes: EOMI. No discharge. Cardiovascular: No JVD. Respiratory: Normal effort.  No stridor. GI: Non-distended. Skin: Abdominal incision healing Abdominal wound with good granulation tissue Psych: Normal mood.  Normal behavior. Musc: No edema in extremities.  No tenderness in extremities. Neuro: Alert Motor: 4+-5/5 throughout, unchanged  Assessment/Plan: 1. 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  Physiatrist is providing close team supervision and 24 hour management of active medical problems listed below.  Physiatrist and rehab team continue to assess barriers to discharge/monitor patient progress toward functional and medical goals  Care Tool:  Bathing    Body parts bathed by patient: Chest, Right arm, Left arm, Abdomen, Front perineal area, Face, Buttocks, Right upper leg, Left upper leg, Right lower leg, Left lower leg     Body parts n/a:  Buttocks, Front perineal area   Bathing assist Assist Level: Contact Guard/Touching assist     Upper Body Dressing/Undressing Upper body dressing   What is the patient wearing?: Pull over shirt, Bra    Upper body assist Assist Level: Supervision/Verbal cueing    Lower Body Dressing/Undressing Lower body dressing      What is the patient wearing?: Pants, Underwear/pull up     Lower body assist Assist for lower body dressing: Minimal Assistance - Patient > 75%     Toileting Toileting    Toileting assist Assist for toileting: Contact Guard/Touching assist     Transfers Chair/bed transfer  Transfers assist     Chair/bed transfer assist level: Contact Guard/Touching assist     Locomotion Ambulation   Ambulation assist      Assist level: Contact Guard/Touching assist Assistive device: Walker-rolling Max distance: 90 ft   Walk 10 feet activity   Assist     Assist level: Contact Guard/Touching assist Assistive device: Walker-rolling   Walk 50 feet activity   Assist    Assist level: Contact Guard/Touching assist Assistive device: Walker-rolling    Walk 150 feet activity   Assist Walk 150 feet activity did not occur: Safety/medical concerns         Walk 10 feet on uneven surface  activity   Assist Walk 10 feet on uneven surfaces activity did not occur: Safety/medical concerns   Assist level: Contact Guard/Touching assist Assistive device: Aeronautical engineer Will patient use wheelchair at discharge?: No             Wheelchair 50 feet with 2 turns activity  Assist            Wheelchair 150 feet activity     Assist          Blood pressure 124/73, pulse 98, temperature 98 F (36.7 C), resp. rate 18, height 5\' 6"  (1.676 m), weight 99.5 kg, SpO2 100 %.  Medical Problem List and Plan:  1. 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 Ordering PT/OT and SLP for functional deficits.   Continue CIR 2. Antithrombotics:   -DVT/anticoagulation: Pharmaceutical: Lovenox   -antiplatelet therapy: N/A  3. Pain Management: Tylenol as needed  4. Mood: LCSW to follow for evaluation and support   -antipsychotic agents:N/A  5. Neuropsych: This patient is capable of making decisions on own behalf. Need to determine who makes decisions for pt.  6. Skin/Wound Care:   Daily dressing changes   Nutrition improving on 10/8 7. Fluids/Electrolytes/Nutrition: Discontinued IV fluids. Monitor intake/output.  8. HTN: Monitor blood pressures. Continue amlodipine daily   Controlled on 10/8 9. Anemia of chronic disease: Continue iron supplement twice daily  CBG (last 3)  Recent Labs    06/15/19 1652 06/15/19 2109 06/16/19 0641  GLUCAP 157* 142* 134*   Hb 7.9 on 10/5, plan to order labs next week 10. T2DM with hyperglycemia: Was on glyburide PTA. Will continue to monitor blood sugars achs.   Hemoglobin A1c 7.4  Con't SSI  Glyburide 1.25 ordered on 10/6-9 given?  Due to timing, not given on 10/7?  Due to availability, first dose given on 10/30  Remains elevated on 10/8 11. Hydrocephalus s/p VP shunt and previous ventriculitis.: Monitor for fevers or recurrent drainage with increase in activity.  12. Impaired vision- will try to determine how much vision loss and if acute vs more chronic.  13. Impaired cognition- SLP for cognition, esp for word finding deficits and overall cognition.   14. Transaminitis  AST mildly elevated on 10/5, plan to order labs for next week 15. CKD stage II  Cr.  1.06 on 10/5  Cont to monitor  LOS: 5 days A FACE TO FACE EVALUATION WAS PERFORMED  Kobey Sides Lorie Phenix 06/16/2019, 11:23 AM

## 2019-06-17 ENCOUNTER — Inpatient Hospital Stay (HOSPITAL_COMMUNITY): Payer: BC Managed Care – PPO | Admitting: Speech Pathology

## 2019-06-17 ENCOUNTER — Inpatient Hospital Stay (HOSPITAL_COMMUNITY): Payer: BC Managed Care – PPO

## 2019-06-17 ENCOUNTER — Ambulatory Visit (HOSPITAL_COMMUNITY): Payer: BC Managed Care – PPO | Admitting: *Deleted

## 2019-06-17 LAB — GLUCOSE, CAPILLARY
Glucose-Capillary: 118 mg/dL — ABNORMAL HIGH (ref 70–99)
Glucose-Capillary: 115 mg/dL — ABNORMAL HIGH (ref 70–99)
Glucose-Capillary: 188 mg/dL — ABNORMAL HIGH (ref 70–99)
Glucose-Capillary: 115 mg/dL — ABNORMAL HIGH (ref 70–99)
Glucose-Capillary: 167 mg/dL — ABNORMAL HIGH (ref 70–99)

## 2019-06-17 NOTE — Progress Notes (Signed)
Speech Language Pathology Daily Session Note  Patient Details  Name: [name] MRN: [id number] Date of Birth: 08/23/1956  Today's Date: 06/17/2019 SLP Individual Time: 0730-0830 SLP Individual Time Calculation (min): 60 min  Short Term Goals: Week 1: SLP Short Term Goal 1 (Week 1): Pt will complete complex problem solving tasks with mod A verbal cues for executive function skills (planning, organizing. ..etc.) SLP Short Term Goal 2 (Week 1): Pt will anticipate barriers to discharge and generate appropriate solutions with supervision A verbal cues. SLP Short Term Goal 3 (Week 1): Pt will demonstrate selective attention durng problem solving tasks in 30 minute intervals with supervision A verbal cues for redirection. SLP Short Term Goal 4 (Week 1): Pt will utilizie compensatory aids to recall novel, daily information with supervision A verbal cues. SLP Short Term Goal 5 (Week 1): Pt will complete higher level word finding and generative naming tasks with supervision A semantic cues.  Skilled Therapeutic Interventions: Pt was seen for skilled ST targeting cognition. In functional conversation, pt was unable to recall compensatory memory strategies without external aid, however once provided Min A verbal cues, she located handout with WRAP strategies. She also recalled activities of last targeted ST session with Supervision A verbal cues for use of external aids. SLP further facilitated session with a novel complex finance management activity. Pt organized complex information with ~90% accuracy with Min faded to Supervision A verbal and visual cues. However, she required Mod-Max A for error awareness and complex problem solving when calculating balances with a calculator. Pt demonstrated insight into level of difficulty presented with this task and identified one strategy to prevent errors in the future (give herself extra time as opposed to trying to rush through calculations). Pt was left in bed  with alarm set and all needs within reach. Continue per current plan of care.        Pain Pain Assessment Pain Score: 0-No pain  Therapy/Group: Individual Therapy  Arbutus Leas 06/17/2019, 9:30 AM

## 2019-06-17 NOTE — Progress Notes (Signed)
Occupational Therapy Session Note  Patient Details  Name: [name] MRN: [id number] Date of Birth: November 04, 1955  Today's Date: 06/17/2019 OT Individual Time: 1798-1025 OT Individual Time Calculation (min): 73 min    Short Term Goals: Week 1:  OT Short Term Goal 1 (Week 1): Pt will complete BSC or toilet transfer with Min A using LRAD OT Short Term Goal 2 (Week 1): Pt will complete LB dressing with Min A OT Short Term Goal 3 (Week 1): Pt will complete 1 grooming task while standing at the sink to improve standing endurance      Skilled Therapeutic Interventions/Progress Updates:  Pt received in bed with no c/o pain an ready for tx with OT. Pt reporting needing to void urine; completes functional mobility with RW to toilet with (S) ane one point of CGA to catch balance over bathroom threshold.  able Pt able to void urine with (S) for toileting. Pt completes UB and LB bathing with (S) at sink level. Pt completes oral care sitting at sink with (S). Pt requires (S) for UB and LB dressing with min VC for threading feet into pants. Pt is total A for donning TEDS and OT educated pt on use of shoe funnel for donning shoes. Pt able to return demonstrate proper use of funnel and agreeable to continuing use. Pt transported to therapy apartment to practice sit <> stand from couch similar to home setting. Pt able to complete functional sit <> stand from couch with CGA and min VC for scooting to edge of couch and hand placement. In therapy gym, pt completes cognition task while standing on uneven surface to increase balance for community surfaces and sustained attention to functional task. Exited session with pt in w/c, chair alarm set and all needs met.      Therapy Documentation Precautions:  Precautions Precautions: Fall Restrictions Weight Bearing Restrictions: No      Therapy/Group: Individual Therapy  Doyne Micke 06/17/2019, 9:22 AM

## 2019-06-17 NOTE — Plan of Care (Signed)
  Problem: Consults Goal: RH BRAIN INJURY PATIENT EDUCATION Description: Description: See Patient Education module for eduction specifics Outcome: Progressing   Problem: RH SKIN INTEGRITY Goal: RH STG SKIN FREE OF INFECTION/BREAKDOWN Outcome: Progressing Goal: RH STG MAINTAIN SKIN INTEGRITY WITH ASSISTANCE Description: STG Maintain Skin Integrity With mod I Assistance. Outcome: Progressing Goal: RH STG ABLE TO PERFORM INCISION/WOUND CARE W/ASSISTANCE Description: STG Able To Perform Incision/Wound Care With min Assistance. Outcome: Progressing   Problem: RH SAFETY Goal: RH STG ADHERE TO SAFETY PRECAUTIONS W/ASSISTANCE/DEVICE Description: STG Adhere to Safety Precautions With min Assistance/Device. Outcome: Progressing   Problem: RH COGNITION-NURSING Goal: RH STG ANTICIPATES NEEDS/CALLS FOR ASSIST W/ASSIST/CUES Description: STG Anticipates Needs/Calls for Assist With mod I Assistance/Cues. Outcome: Progressing   Problem: RH PAIN MANAGEMENT Goal: RH STG PAIN MANAGED AT OR BELOW PT'S PAIN GOAL Description: Less than 3 out of 10 Outcome: Progressing

## 2019-06-17 NOTE — Progress Notes (Signed)
[facility] PHYSICAL MEDICINE & REHABILITATION PROGRESS NOTE   Subjective/Complaints: Patient seen sitting up in bed this morning.  She states she slept well overnight.  She denies complaints.  ROS: Denies CP, SOB, N/V/D  Objective:   No results found. No results for input(s): WBC, HGB, HCT, PLT in the last 72 hours. No results for input(s): NA, K, CL, CO2, GLUCOSE, BUN, CREATININE, CALCIUM in the last 72 hours.  Intake/Output Summary (Last 24 hours) at 06/17/2019 1137 Last data filed at 06/17/2019 0824 Gross per 24 hour  Intake 480 ml  Output -  Net 480 ml     Physical Exam: Vital Signs Blood pressure 118/68, pulse 77, temperature 98.3 F (36.8 C), resp. rate 18, height 5\' 6"  (1.676 m), weight 99.5 kg, SpO2 100 %. Constitutional: No distress . Vital signs reviewed. HENT: Normocephalic.  Atraumatic. Eyes: EOMI. No discharge. Cardiovascular: No JVD. Respiratory: Normal effort.  No stridor. GI: Non-distended. Skin: Abdominal incision healing Abdominal wound with dressing C/D/I Psych: Normal mood.  Normal behavior. Musc: No edema in extremities.  No tenderness in extremities. Neuro: Alert Motor: 4+-5/5 throughout, stable  Assessment/Plan: 1. 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  Physiatrist is providing close team supervision and 24 hour management of active medical problems listed below.  Physiatrist and rehab team continue to assess barriers to discharge/monitor patient progress toward functional and medical goals  Care Tool:  Bathing    Body parts bathed by patient: Right arm, Left arm, Chest, Abdomen, Front perineal area, Buttocks, Left upper leg, Right upper leg, Face     Body parts n/a: Right lower leg, Left lower leg   Bathing assist Assist Level:  Supervision/Verbal cueing     Upper Body Dressing/Undressing Upper body dressing   What is the patient wearing?: Bra, Pull over shirt    Upper body assist Assist Level: Supervision/Verbal cueing    Lower Body Dressing/Undressing Lower body dressing      What is the patient wearing?: Pants     Lower body assist Assist for lower body dressing: Supervision/Verbal cueing     Toileting Toileting    Toileting assist Assist for toileting: Supervision/Verbal cueing     Transfers Chair/bed transfer  Transfers assist     Chair/bed transfer assist level: Contact Guard/Touching assist     Locomotion Ambulation   Ambulation assist      Assist level: Contact Guard/Touching assist Assistive device: Walker-rolling Max distance: 80   Walk 10 feet activity   Assist     Assist level: Contact Guard/Touching assist Assistive device: Walker-rolling   Walk 50 feet activity   Assist    Assist level: Contact Guard/Touching assist Assistive device: Walker-rolling    Walk 150 feet activity   Assist Walk 150 feet activity did not occur: Safety/medical concerns         Walk 10 feet on uneven surface  activity   Assist Walk 10 feet on uneven surfaces activity did not occur: Safety/medical concerns   Assist level: Contact Guard/Touching assist Assistive device: Aeronautical engineer Will patient use wheelchair at discharge?: No             Wheelchair 50 feet with 2 turns activity    Assist            Wheelchair 150 feet activity  Assist          Blood pressure 118/68, pulse 77, temperature 98.3 F (36.8 C), resp. rate 18, height 5\' 6"  (1.676 m), weight 99.5 kg, SpO2 100 %.  Medical Problem List and Plan:  1. 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 Ordering PT/OT and SLP for functional deficits.   Continue CIR  D/c sutures 2. Antithrombotics:   -DVT/anticoagulation: Pharmaceutical: Lovenox   -antiplatelet therapy: N/A  3. Pain Management: Tylenol as needed  4. Mood: LCSW to follow for evaluation and support   -antipsychotic agents:N/A  5. Neuropsych: This patient is capable of making decisions on own behalf. Need to determine who makes decisions for pt.  6. Skin/Wound Care:   Daily dressing changes   Nutrition improved 7. Fluids/Electrolytes/Nutrition: Discontinued IV fluids. Monitor intake/output.  8. HTN: Monitor blood pressures. Continue amlodipine daily   Controlled on 10/9 9. Anemia of chronic disease: Continue iron supplement twice daily  CBG (last 3)  Recent Labs    06/16/19 1726 06/16/19 2111 06/17/19 0613  GLUCAP 112* 118* 115*   Hb 7.9 on 10/5, labs ordered for Monday 10. T2DM with hyperglycemia: Was on glyburide PTA. Will continue to monitor blood sugars achs.   Hemoglobin A1c 7.4  Con't SSI  Glyburide 1.25 ordered on 10/6-9 given?  Due to timing, not given on 10/7?  Due to availability, first dose given on 10/30  Improving on 10/9 11. Hydrocephalus s/p VP shunt and previous ventriculitis.: Monitor for fevers or recurrent drainage with increase in activity.  12. Impaired vision- will try to determine how much vision loss and if acute vs more chronic.  13. Impaired cognition- SLP for cognition, esp for word finding deficits and overall cognition.   14. CKD stage II  Cr.  1.06 on 10/5, labs ordered for Monday  Cont to monitor  LOS: 6 days A FACE TO FACE EVALUATION WAS PERFORMED   Lorie Phenix 06/17/2019, 11:37 AM

## 2019-06-17 NOTE — Progress Notes (Signed)
Physical Therapy Session Note  Patient Details  Name: [name] MRN: [id number] Date of Birth: September 10, 1955  Today's Date: 06/17/2019 PT Individual Time: [id number] PT Individual Time Calculation (min): 57 min   Short Term Goals: Week 1:  PT Short Term Goal 1 (Week 1): STG=LTG due to ELOS  Skilled Therapeutic Interventions/Progress Updates:   Functional transfers with RW throughout session with overall CGA for balance. NMR for blocked practice sit <.> stands without AD x 5 reps, holding a ball and raising up/down for fluid movement pattern and eccentric control training x 5 reps, mini squats without UE support x 10 reps and min assist, compliant surface balance retraining activities while standing without UE support and with RW for 1 UE support while performing functional reaching task across midline (CGA for balance) - seated rest break between trials. Gait training with RW x 50' and x 130' with CGA for functional mobility training and general strengthening/endurance - noted narrow BOS and decreased step length. Dynamic gait through obstacle course including toe taps to cone and weaving around obstacles with CGA x 50' to simulate home environment mobility.   Therapy Documentation Precautions:  Precautions Precautions: Fall Restrictions Weight Bearing Restrictions: No Pain: Reports mild headache - RN notified for pain medication.     Therapy/Group: Individual Therapy  Canary Brim Ivory Broad, PT, DPT, CBIS  06/17/2019, 3:33 PM

## 2019-06-18 ENCOUNTER — Inpatient Hospital Stay (HOSPITAL_COMMUNITY): Payer: BC Managed Care – PPO | Admitting: Physical Therapy

## 2019-06-18 LAB — GLUCOSE, CAPILLARY
Glucose-Capillary: 150 mg/dL — ABNORMAL HIGH (ref 70–99)
Glucose-Capillary: 216 mg/dL — ABNORMAL HIGH (ref 70–99)
Glucose-Capillary: 118 mg/dL — ABNORMAL HIGH (ref 70–99)
Glucose-Capillary: 173 mg/dL — ABNORMAL HIGH (ref 70–99)

## 2019-06-18 NOTE — Progress Notes (Signed)
[place] PHYSICAL MEDICINE & REHABILITATION PROGRESS NOTE   Subjective/Complaints:  No issues overnite, good BM this am   ROS: Denies CP, SOB, N/V/D  Objective:   No results found. No results for input(s): WBC, HGB, HCT, PLT in the last 72 hours. No results for input(s): NA, K, CL, CO2, GLUCOSE, BUN, CREATININE, CALCIUM in the last 72 hours.  Intake/Output Summary (Last 24 hours) at 06/18/2019 0951 Last data filed at 06/18/2019 0730 Gross per 24 hour  Intake 840 ml  Output -  Net 840 ml     Physical Exam: Vital Signs Blood pressure 128/73, pulse 92, temperature 98.4 F (36.9 C), resp. rate 18, height 5\' 6"  (1.676 m), weight 99.5 kg, SpO2 100 %. Constitutional: No distress . Vital signs reviewed. HENT: Normocephalic.  Atraumatic. Eyes: EOMI. No discharge. Cardiovascular: No JVD. Respiratory: Normal effort.  No stridor. GI: Non-distended.  Psych: Normal mood.  Normal behavior. Musc: No edema in extremities.  No tenderness in extremities. Neuro: Alert Motor: 4+-5/5 throughout, stable  Assessment/Plan: 1. 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  Physiatrist is providing close team supervision and 24 hour management of active medical problems listed below.  Physiatrist and rehab team continue to assess barriers to discharge/monitor patient progress toward functional and medical goals  Care Tool:  Bathing    Body parts bathed by patient: Right arm, Left arm, Chest, Abdomen, Front perineal area, Buttocks, Left upper leg, Right upper leg, Face     Body parts n/a: Right lower leg, Left lower leg   Bathing assist Assist Level: Supervision/Verbal cueing     Upper Body Dressing/Undressing Upper body dressing   What is the patient wearing?: Bra, Pull over shirt     Upper body assist Assist Level: Supervision/Verbal cueing    Lower Body Dressing/Undressing Lower body dressing      What is the patient wearing?: Pants     Lower body assist Assist for lower body dressing: Supervision/Verbal cueing     Toileting Toileting    Toileting assist Assist for toileting: Supervision/Verbal cueing     Transfers Chair/bed transfer  Transfers assist     Chair/bed transfer assist level: Contact Guard/Touching assist     Locomotion Ambulation   Ambulation assist      Assist level: Contact Guard/Touching assist Assistive device: Walker-rolling Max distance: 130'   Walk 10 feet activity   Assist     Assist level: Contact Guard/Touching assist Assistive device: Walker-rolling   Walk 50 feet activity   Assist    Assist level: Contact Guard/Touching assist Assistive device: Walker-rolling    Walk 150 feet activity   Assist Walk 150 feet activity did not occur: Safety/medical concerns         Walk 10 feet on uneven surface  activity   Assist Walk 10 feet on uneven surfaces activity did not occur: Safety/medical concerns   Assist level: Contact Guard/Touching assist Assistive device: Walker-rolling   Wheelchair     Assist Will patient use wheelchair at discharge?: No             Wheelchair 50 feet with 2 turns activity    Assist            Wheelchair 150 feet activity     Assist          Blood pressure 128/73, pulse 92, temperature  98.4 F (36.9 C), resp. rate 18, height 5\' 6"  (1.676 m), weight 99.5 kg, SpO2 100 %.  Medical Problem List and Plan:  1. 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 Ordering PT/OT and SLP for functional deficits.   Continue CIR  D/c sutures 2. Antithrombotics:    -DVT/anticoagulation: Pharmaceutical: Lovenox   -antiplatelet therapy: N/A  3. Pain Management: Tylenol as needed  4. Mood: LCSW to follow for evaluation and support   -antipsychotic agents:N/A  5. Neuropsych: This patient is capable of making decisions on own behalf. Need to determine who makes decisions for pt.  6. Skin/Wound Care:   Daily dressing changes   Nutrition improved 7. Fluids/Electrolytes/Nutrition: Discontinued IV fluids. Monitor intake/output.  8. HTN: Monitor blood pressures. Continue amlodipine daily   Controlled on 10/9 9. Anemia of chronic disease: Continue iron supplement twice daily  CBG (last 3) Hb 7.9 on 10/5, labs ordered for Monday Recent Labs    06/17/19 1640 06/17/19 2129 06/18/19 0636  GLUCAP 115* 167* 150*   Fair control 10/10  10. T2DM with hyperglycemia: Was on glyburide PTA. Will continue to monitor blood sugars achs.   Hemoglobin A1c 7.4  Con't SSI  Glyburide 1.25mg  qam  11. Hydrocephalus s/p VP shunt and previous ventriculitis.: Monitor for fevers or recurrent drainage with increase in activity.  12. Impaired vision- will try to determine how much vision loss and if acute vs more chronic.  13. Impaired cognition- SLP for cognition, esp for word finding deficits and overall cognition.   14. CKD stage II  Cr.  1.06 on 10/5, labs ordered for Monday  Cont to monitor  LOS: 7 days A FACE TO [place] E  06/18/2019, 9:51 AM

## 2019-06-18 NOTE — Progress Notes (Signed)
Physical Therapy Session Note  Patient Details  Name: [name] MRN: [id number] Date of Birth: 07/06/1956  Today's Date: 06/18/2019 PT Individual Time: 1303-1358 PT Individual Time Calculation (min): 55 min   Short Term Goals: Week 1:  PT Short Term Goal 1 (Week 1): STG=LTG due to ELOS  Skilled Therapeutic Interventions/Progress Updates:  Pt received in bed & agreeable to tx. Pt transfers to sitting EOB with hospital bed features & supervision. Pt dons bra with set up assist while sitting EOB. Pt ambulates ~7 ft to w/c with RW & close supervision & therapist transports pt to w/c via w/c dependent assist for time management. Pt negotiates 8 steps (6") with B rails & supervision with instructional cuing for compensatory pattern. Pt ambulates gym>apartment>chairs by elevators>dayroom>room with RW & close supervision with pt demonstrating decreased gait speed & slight LLE antalgic gait. Pt with c/o L knee fatigue & unrated pain but declines therapist asking for pain medication, rest breaks use for pain relief. In apartment, pt completed functional sit<>stand transfers from various surfaces to simulate home environment, including low, compliant couch & rocking recliner with cuing for hand placement but close supervision overall. Pt utilized nu-step on level 4 x 10 minutes with all four extremities with task focusing on global strengthening, endurance training, & coordination of reciprocal movements. At end of session pt left in bed with alarm set & all needs in reach.  Therapy Documentation Precautions:  Precautions Precautions: Fall Restrictions Weight Bearing Restrictions: No      Therapy/Group: Individual Therapy  Waunita Schooner 06/18/2019, 1:59 PM

## 2019-06-19 ENCOUNTER — Inpatient Hospital Stay (HOSPITAL_COMMUNITY): Payer: BC Managed Care – PPO | Admitting: Speech Pathology

## 2019-06-19 LAB — GLUCOSE, CAPILLARY
Glucose-Capillary: 131 mg/dL — ABNORMAL HIGH (ref 70–99)
Glucose-Capillary: 224 mg/dL — ABNORMAL HIGH (ref 70–99)
Glucose-Capillary: 78 mg/dL (ref 70–99)
Glucose-Capillary: 157 mg/dL — ABNORMAL HIGH (ref 70–99)

## 2019-06-19 NOTE — Progress Notes (Signed)
Speech Language Pathology Daily Session Note  Patient Details  Name: KAELEN GOVONI MRN: [id number] Date of Birth: 1956-09-07  Today's Date: 06/19/2019 SLP Individual Time: 0910-0950 SLP Individual Time Calculation (min): 40 min  Short Term Goals: Week 1: SLP Short Term Goal 1 (Week 1): Pt will complete complex problem solving tasks with mod A verbal cues for executive function skills (planning, organizing. ..etc.) SLP Short Term Goal 2 (Week 1): Pt will anticipate barriers to discharge and generate appropriate solutions with supervision A verbal cues. SLP Short Term Goal 3 (Week 1): Pt will demonstrate selective attention durng problem solving tasks in 30 minute intervals with supervision A verbal cues for redirection. SLP Short Term Goal 4 (Week 1): Pt will utilizie compensatory aids to recall novel, daily information with supervision A verbal cues. SLP Short Term Goal 5 (Week 1): Pt will complete higher level word finding and generative naming tasks with supervision A semantic cues.  Skilled Therapeutic Interventions: Skilled treatment session focused on cognitive goals. SLP facilitated session by providing extra time and Min-Mod A verbal cues for problem solving and organization during a complex scheduling making task in which the patient had to schedule multiple tasks within specific time constraints efficiently. Patient demonstrated appropriate emergent awareness in regards to difficulty of task, especially in regards to recalling all of the tasks in the way they were presented (in paragraph form) and need to write them out in list form to maximize recall of information. Patient left upright in bed with alarm on and all needs within reach. Continue with current plan of care.    Pain No/Denies Pain   Therapy/Group: Individual Therapy  Read Bonelli 06/19/2019, 10:29 AM

## 2019-06-19 NOTE — Progress Notes (Signed)
[place] PHYSICAL MEDICINE & REHABILITATION PROGRESS NOTE   Subjective/Complaints: Good day yesterday and today    ROS: Denies CP, SOB, N/V/D  Objective:   No results found. No results for input(s): WBC, HGB, HCT, PLT in the last 72 hours. No results for input(s): NA, K, CL, CO2, GLUCOSE, BUN, CREATININE, CALCIUM in the last 72 hours.  Intake/Output Summary (Last 24 hours) at 06/19/2019 1002 Last data filed at 06/19/2019 0730 Gross per 24 hour  Intake 1200 ml  Output -  Net 1200 ml     Physical Exam: Vital Signs Blood pressure 137/83, pulse 89, temperature 98 F (36.7 C), temperature source Oral, resp. rate 19, height 5\' 6"  (1.676 m), weight 99.5 kg, SpO2 100 %. Constitutional: No distress . Vital signs reviewed. HENT: Normocephalic.  Atraumatic. Eyes: EOMI. No discharge. Cardiovascular: No JVD.RRR no murmur Respiratory: Normal effort.  No stridor. GI: Non-distended.  Psych: Normal mood.  Normal behavior. Musc: No edema in extremities.  No tenderness in extremities. Neuro: Alert Motor: 4+-5/5 throughout, stable  Assessment/Plan: 1. 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  Physiatrist is providing close team supervision and 24 hour management of active medical problems listed below.  Physiatrist and rehab team continue to assess barriers to discharge/monitor patient progress toward functional and medical goals  Care Tool:  Bathing    Body parts bathed by patient: Right arm, Left arm, Chest, Abdomen, Front perineal area, Buttocks, Left upper leg, Right upper leg, Face     Body parts n/a: Right lower leg, Left lower leg   Bathing assist Assist Level: Supervision/Verbal cueing     Upper Body Dressing/Undressing Upper body dressing   What is the patient  wearing?: Bra, Pull over shirt    Upper body assist Assist Level: Supervision/Verbal cueing    Lower Body Dressing/Undressing Lower body dressing      What is the patient wearing?: Pants     Lower body assist Assist for lower body dressing: Supervision/Verbal cueing     Toileting Toileting    Toileting assist Assist for toileting: Supervision/Verbal cueing     Transfers Chair/bed transfer  Transfers assist     Chair/bed transfer assist level: Supervision/Verbal cueing     Locomotion Ambulation   Ambulation assist      Assist level: Supervision/Verbal cueing Assistive device: Walker-rolling Max distance: 150 ft   Walk 10 feet activity   Assist     Assist level: Supervision/Verbal cueing Assistive device: Walker-rolling   Walk 50 feet activity   Assist    Assist level: Supervision/Verbal cueing Assistive device: Walker-rolling    Walk 150 feet activity   Assist Walk 150 feet activity did not occur: Safety/medical concerns  Assist level: Supervision/Verbal cueing Assistive device: Walker-rolling    Walk 10 feet on uneven surface  activity   Assist Walk 10 feet on uneven surfaces activity did not occur: Safety/medical concerns   Assist level: Contact Guard/Touching assist Assistive device: Aeronautical engineer Will patient use wheelchair at discharge?: No             Wheelchair 50 feet with 2 turns activity    Assist            Wheelchair 150 feet activity     Assist          Blood  pressure 137/83, pulse 89, temperature 98 F (36.7 C), temperature source Oral, resp. rate 19, height 5\' 6"  (1.676 m), weight 99.5 kg, SpO2 100 %.  Medical Problem List and Plan:  1. 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 Ordering PT/OT and SLP for functional deficits.   Continue CIR  D/c sutures 2. Antithrombotics:   -DVT/anticoagulation: Pharmaceutical: Lovenox   -antiplatelet therapy: N/A  3. Pain Management: Tylenol as needed  4. Mood: LCSW to follow for evaluation and support   -antipsychotic agents:N/A  5. Neuropsych: This patient is capable of making decisions on own behalf. Need to determine who makes decisions for pt.  6. Skin/Wound Care:   Daily dressing changes   Nutrition improved 7. Fluids/Electrolytes/Nutrition: Discontinued IV fluids. Monitor intake/output.  8. HTN: Monitor blood pressures. Continue amlodipine daily    Vitals:   06/18/19 1925 06/19/19 0332  BP: (!) 141/72 137/83  Pulse: 85 89  Resp: 16 19  Temp: 98 F (36.7 C) 98 F (36.7 C)  SpO2: 100% 100%  controlled 10/11 9. Anemia of chronic disease: Continue iron supplement twice daily  CBG (last 3) Hb 7.9 on 10/5, labs ordered for Monday Recent Labs    06/18/19 1639 06/18/19 2105 06/19/19 0615  GLUCAP 118* 173* 131*   Good  control 10/11  10. T2DM with hyperglycemia: Was on glyburide PTA. Will continue to monitor blood sugars achs.   Hemoglobin A1c 7.4  Con't SSI  Glyburide 1.25mg  qam  11. Hydrocephalus s/p VP shunt and previous ventriculitis.: Monitor for fevers or recurrent drainage with increase in activity.  12. Impaired vision- will try to determine how much vision loss and if acute vs more chronic.  13. Impaired cognition- SLP for cognition, esp for word finding deficits and overall cognition.   14. CKD stage II  Cr.  1.06 on 10/5, labs ordered for Monday  Cont to monitor  LOS: 8 days A FACE TO [place] E  06/19/2019, 10:02 AM

## 2019-06-20 ENCOUNTER — Inpatient Hospital Stay (HOSPITAL_COMMUNITY): Payer: BC Managed Care – PPO | Admitting: Physical Therapy

## 2019-06-20 ENCOUNTER — Inpatient Hospital Stay (HOSPITAL_COMMUNITY): Payer: BC Managed Care – PPO

## 2019-06-20 ENCOUNTER — Inpatient Hospital Stay (HOSPITAL_COMMUNITY): Payer: BC Managed Care – PPO | Admitting: Speech Pathology

## 2019-06-20 LAB — COMPREHENSIVE METABOLIC PANEL
ALT: 24 U/L (ref 0–44)
AST: 36 U/L (ref 15–41)
Albumin: 3 g/dL — ABNORMAL LOW (ref 3.5–5.0)
Alkaline Phosphatase: 76 U/L (ref 38–126)
Anion gap: 12 (ref 5–15)
BUN: 6 mg/dL — ABNORMAL LOW (ref 8–23)
CO2: 21 mmol/L — ABNORMAL LOW (ref 22–32)
Calcium: 8.7 mg/dL — ABNORMAL LOW (ref 8.9–10.3)
Chloride: 105 mmol/L (ref 98–111)
Creatinine, Ser: 1.18 mg/dL — ABNORMAL HIGH (ref 0.44–1.00)
GFR calc Af Amer: 58 mL/min — ABNORMAL LOW (ref >60)
GFR calc non Af Amer: 50 mL/min — ABNORMAL LOW (ref >60)
Glucose, Bld: 156 mg/dL — ABNORMAL HIGH (ref 70–99)
Potassium: 3.5 mmol/L (ref 3.5–5.1)
Sodium: 138 mmol/L (ref 135–145)
Total Bilirubin: 0.4 mg/dL (ref 0.3–1.2)
Total Protein: 6.8 g/dL (ref 6.5–8.1)

## 2019-06-20 LAB — GLUCOSE, CAPILLARY
Glucose-Capillary: 144 mg/dL — ABNORMAL HIGH (ref 70–99)
Glucose-Capillary: 146 mg/dL — ABNORMAL HIGH (ref 70–99)
Glucose-Capillary: 158 mg/dL — ABNORMAL HIGH (ref 70–99)
Glucose-Capillary: 223 mg/dL — ABNORMAL HIGH (ref 70–99)

## 2019-06-20 LAB — CBC
HCT: 24.6 % — ABNORMAL LOW (ref 36.0–46.0)
Hemoglobin: 7.2 g/dL — ABNORMAL LOW (ref 12.0–15.0)
MCH: 26.5 pg (ref 26.0–34.0)
MCHC: 29.3 g/dL — ABNORMAL LOW (ref 30.0–36.0)
MCV: 90.4 fL (ref 80.0–100.0)
Platelets: 292 10*3/uL (ref 150–400)
RBC: 2.72 MIL/uL — ABNORMAL LOW (ref 3.87–5.11)
RDW: 18.8 % — ABNORMAL HIGH (ref 11.5–15.5)
WBC: 5.2 10*3/uL (ref 4.0–10.5)
nRBC: 0 % (ref 0.0–0.2)

## 2019-06-20 NOTE — Progress Notes (Signed)
Occupational Therapy Discharge Summary  Patient Details  Name: [name] MRN: [id number] Date of Birth: 23-Oct-1955  Today's Date: 06/20/2019 OT Individual Time: 1300-1415 OT Individual Time Calculation (min): 75 min    Patient has met 9 of 9 long term goals due to improved activity tolerance, improved balance, postural control, ability to compensate for deficits, improved attention, improved awareness and improved coordination.  Pt has made excellent progress in CIR. Patient to discharge at overall Supervision level. Pt will be discharging initially to her fathers home to ensure 24/7 supervision and then with the help of [place] and family will d/c home when appropriate. Patient's care partner is independent to provide the necessary cognitive assistance at discharge.    Reasons goals not met: All treatment goals met  Recommendation:  Patient will benefit from ongoing skilled OT services in home health setting to continue to advance functional skills in the area of BADL.  Equipment: TTB and BSC  Reasons for discharge: treatment goals met and discharge from hospital  Patient/family agrees with progress made and goals achieved: Yes   Skilled OT Intervention: Pt received sitting up in w/c with no c/o pain. Pt completed functional mobility into bathroom using RW with (S). Pt required cueing/edu for doffing clothing seated rather than stand for fall risk reduction. Pt completed shower from seated level with use of BSC, similar to set up at her fathers house where she will d/c to. Pt able to complete all bathing and dressing from (S) level, mod I when seated. Discussed d/c planning and safety. Pt completed simulated tub transfer using a TTB with (S). Pt was returned to her room and left sitting up with all needs met.   OT Discharge Precautions/Restrictions  Precautions Precautions: Fall Restrictions Weight Bearing Restrictions: No   Vital Signs Therapy Vitals Temp: 98.6 F (37  C) Pulse Rate: 82 Resp: 18 BP: 119/76 Patient Position (if appropriate): Sitting Oxygen Therapy SpO2: 100 % O2 Device: Room Air Pain Pain Assessment Pain Scale: 0-10 Pain Score: 0-No pain ADL ADL Eating: Modified independent Where Assessed-Eating: Chair Grooming: Modified independent Where Assessed-Grooming: Sitting at sink Upper Body Bathing: Supervision/safety Where Assessed-Upper Body Bathing: Shower Lower Body Bathing: Supervision/safety Where Assessed-Lower Body Bathing: Shower Upper Body Dressing: Supervision/safety Where Assessed-Upper Body Dressing: Chair Lower Body Dressing: Supervision/safety Where Assessed-Lower Body Dressing: Chair Toileting: Supervision/safety Where Assessed-Toileting: Glass blower/designer: Close supervision Toilet Transfer Method: Counselling psychologist: Energy manager: Close supervison Clinical cytogeneticist Method: Optometrist: Facilities manager: Close supervision Social research officer, government Method: Heritage manager: Grab bars Vision Baseline Vision/History: Wears glasses Wears Glasses: Distance only Patient Visual Report: Blurring of vision Vision Assessment?: No apparent visual deficits Perception  Perception: Within Functional Limits Praxis Praxis: Intact Cognition Overall Cognitive Status: Impaired/Different from baseline Arousal/Alertness: Awake/alert Orientation Level: Oriented X4 Attention: Selective;Sustained Focused Attention: Appears intact Sustained Attention: Appears intact Selective Attention: Appears intact Memory: Impaired Memory Impairment: Decreased recall of new information;Retrieval deficit Decreased Short Term Memory: Verbal complex Awareness: Impaired Awareness Impairment: Anticipatory impairment Problem Solving: Impaired Problem Solving Impairment: Functional complex;Verbal complex Organizing: Impaired Organizing  Impairment: Verbal complex;Functional complex Safety/Judgment: Appears intact Sensation Sensation Light Touch: Appears Intact Proprioception: Appears Intact Additional Comments: 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 Coordination Gross Motor Movements are Fluid and Coordinated: No Fine Motor Movements are Fluid and Coordinated: No Coordination and Movement Description: Generalized weakness Heel Shin Test: BLE equal, but impaired coordination overall Motor  Motor Motor: Abnormal postural  alignment and control Motor - Discharge Observations: generalized weakness Mobility  Bed Mobility Bed Mobility: Rolling Left;Rolling Right;Sit to Supine;Supine to Sit Rolling Right: Supervision/verbal cueing Rolling Left: Supervision/Verbal cueing Supine to Sit: Supervision/Verbal cueing Sit to Supine: Supervision/Verbal cueing Transfers Sit to Stand: Supervision/Verbal cueing Stand to Sit: Supervision/Verbal cueing  Trunk/Postural Assessment  Cervical Assessment Cervical Assessment: Within Functional Limits Thoracic Assessment Thoracic Assessment: Within Functional Limits Lumbar Assessment Lumbar Assessment: Within Functional Limits Postural Control Postural Control: Deficits on evaluation Righting Reactions: delayed Protective Responses: delayed  Balance Balance Balance Assessed: Yes Static Sitting Balance Static Sitting - Balance Support: Feet supported;No upper extremity supported Static Sitting - Level of Assistance: 6: Modified independent (Device/Increase time) Dynamic Sitting Balance Dynamic Sitting - Balance Support: Feet supported Dynamic Sitting - Level of Assistance: 6: Modified independent (Device/Increase time) Static Standing Balance Static Standing - Balance Support: During functional activity;Bilateral upper extremity supported Static Standing - Level of Assistance: 5: Stand by assistance Dynamic Standing Balance Dynamic Standing - Balance Support: During functional  activity;Bilateral upper extremity supported Dynamic Standing - Level of Assistance: 5: Stand by assistance Extremity/Trunk Assessment RUE Assessment RUE Assessment: Exceptions to [facility] General Strength Comments: 4/5 overall. Limited AROM in her shoulders 2/2 arthritis LUE Assessment LUE Assessment: Exceptions to [facility] General Strength Comments: 4/5 overall, limited shoulder ROM 2/2 arthritis   Curtis Sites 06/20/2019, 1:43 PM

## 2019-06-20 NOTE — Progress Notes (Signed)
Speech Language Pathology Discharge Summary  Patient Details  Name: [name] MRN: [id number] Date of Birth: September 23, 1955  Today's Date: 06/20/2019 SLP Individual Time: 0830-0930 SLP Individual Time Calculation (min): 60 min   Skilled Therapeutic Interventions:  Pt was seen for skilled ST targeting cognitive goals. SLP facilitated session with administration of Cognistat to assess pt's progress since admission. Pt scored WFL on all most subtests, with the exception of short term memory (score of 4 indicative of severe impairment) and abstract reasoning (score of 4 indicative of mild impairment). According to previous notes, pt was unable to complete 1/3 visual construction tasks at initial evaluation and today pt completed all construction tasks with no more than Supervision A. Following review of test results, pt verbalized 2 compensatory memory strategies to use at home (routine, writing things out) for recall of day to day information. SLP also suggested pt use recurring alarms on her phone to assist with remembering to take medications. In functional conversation, pt recalled activities of previous ST session with only Supervision A question cues. She also independently stated someone should "check behind her" when she goes back to working from home; SLP emphasized importance of also having someone to assist/check behind her with complex tasks such as finance and medication management. Pt confirmed she will be staying with her father, and between him and other family members will have 24/7 supervision and appropriate assistance with mobility and complex cognitive tasks. Pt was left in bed with alarm set and call bell within reach. Continue per current plan of care.    Patient has met 4 of 4 long term goals.  Patient to discharge at overall Supervision;Min level.  Reasons goals not met: n/a   Clinical Impression/Discharge Summary:   Pt made functional gains and met 4 out of 4 targeted long  term goals this admission. One additional goal for verbal expression/word finding was determined to be inapplicable and not targeted in therapy, as pt's communication was functional at conversation level and no word finding deficits were noted by clinician or pt outside of initial evaluation. Pt currently requires Supervision-Min assist for complex cognitive tasks/executive functioning and will require 24/7 supervision. Pt has demonstrated improved use of compensatory memory strategies, selective attention, complex problem solving, organization and planning, as well as anticipatory safety awareness. However, given short term memory, complex problem solving and executive functioning deficits still present, recommend pt continue to receive skilled outpatient ST services upon discharge. Pt education is complete at this time (no family was present for ST sessions).   Care Partner:  Caregiver Able to Provide Assistance: Yes  Type of Caregiver Assistance: Cognitive  Recommendation:  24 hour supervision/assistance;Outpatient SLP  Rationale for SLP Follow Up: Maximize cognitive function and independence   Equipment: none   Reasons for discharge: Discharged from hospital   Patient/Family Agrees with Progress Made and Goals Achieved: Yes    Arbutus Leas 06/20/2019, 9:38 AM

## 2019-06-20 NOTE — Progress Notes (Signed)
Physical Therapy Discharge Summary  Patient Details  Name: [name] MRN: [id number] Date of Birth: 1955/10/27  Today's Date: 06/20/2019 PT Individual Time: 1107-1202 PT Individual Time Calculation (min): 55 min    Patient has met 8 of 8 long term goals due to improved activity tolerance, improved balance, improved postural control, increased strength, ability to compensate for deficits, improved awareness and improved coordination.  Patient to discharge at an ambulatory level supervision with RW.  No family has been present for caregiver training. Therapist spoke with pt's sister Margaretha Sheffield) on the phone to educate her on pt's CLOF, need for 24 hr supervision upon d/c & other information (see below).  Reasons goals not met: n/a  Recommendation:  Patient will benefit from ongoing skilled PT services in home health setting to continue to advance safe functional mobility, address ongoing impairments in balance, cognition/awareness, endurance, progress gait with LRAD, and minimize fall risk.  Equipment: 20x18 w/c, RW  Reasons for discharge: treatment goals met and discharge from hospital  Patient/family agrees with progress made and goals achieved: Yes  Skilled PT Treatment: Pt received in room & agreeable to tx. Pt performs bed mobility with bed flat, no rails with supervision. Educated pt on DME & HHPT f/u. Manual testing completed as noted below. Pt reports her sister is picking her up tomorrow & she drives an SUV but pt would rather ride home in her sedan. Pt called sister to ask her to bring her sedan to pick her up tomorrow. Therapist spoke with pt's sister Margaretha Sheffield) on speaker phone with pt present to educate her on pt's CLOF (supervision with RW, need to use B rails to negotiate stairs to enter home), DME & f/u HHPT, and home safety modifications (removew/secure throw rugs) with Margaretha Sheffield voicing understanding of all information. Pt requests to change brief 2/2 brief being old. Pt  transfers sit<>stand at EOB with supervision & therapist assists pt with donning new brief for time management; pt's brief wet 2/2 incontinence & pt unaware. Pt transfers to w/c with RW & min assist 2/2 1 LOB & cuing to decrease speed of movement. Transported pt to gym via w/c dependent assist for time management. Pt completes car transfer at sedan simulated height with RW & supervision. Pt negotiates ramp with RW & supervision, and mulch with RW & CGA. Pt negotiates 12 steps (6" + 3") with B rails and supervision, demonstrating compensatory pattern with only 1 questioning cue. Pt ambulates gym>room with RW & supervision with pt reporting unrated L knee pain/fatigue & like it won't support her but pt did not request rest break. At end of session pt left in w/c with chair alarm donned, all needs in reach, NT in room.  PT Discharge Precautions/Restrictions Precautions Precautions: Fall Restrictions Weight Bearing Restrictions: No    Vision/Perception  Pt reports she wears glasses for reading only at baseline. Pt denies changes in baseline vision. Perception appears WFL.   Cognition Overall Cognitive Status: Impaired/Different from baseline Arousal/Alertness: Awake/alert Orientation Level: Oriented X4 Focused Attention: Appears intact Sustained Attention: Appears intact Selective Attention: Appears intact Memory: Impaired Memory Impairment: Decreased recall of new information Awareness: Impaired Awareness Impairment: Anticipatory impairment Problem Solving: Impaired Problem Solving Impairment: Functional complex;Verbal complex Safety/Judgment: Appears intact   Sensation Sensation Light Touch: Appears Intact(appears intact to BLE) Proprioception: Appears Intact(BLE) Additional Comments: 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 Coordination Gross Motor Movements are Fluid and Coordinated: No Heel Shin Test: BLE equal, but impaired coordination overall   Motor  Motor  Motor: Abnormal  postural alignment and control Motor - Discharge Observations: generalized weakness   Mobility Bed Mobility Bed Mobility: Rolling Left;Rolling Right;Sit to Supine;Supine to Sit(bed flat, no rails) Rolling Right: Supervision/verbal cueing Rolling Left: Supervision/Verbal cueing Supine to Sit: Supervision/Verbal cueing Sit to Supine: Supervision/Verbal cueing Transfers Transfers: Sit to Stand;Stand to Sit Sit to Stand: Supervision/Verbal cueing Stand to Sit: Supervision/Verbal cueing   Locomotion  Gait Ambulation: Yes Gait Assistance: Supervision/Verbal cueing Gait Distance (Feet): 150 Feet Assistive device: Rolling walker Gait Gait: Yes Gait Pattern: Impaired Gait Pattern: Decreased step length - right;Decreased step length - left;Decreased stride length Gait velocity: antalgic L, when L knee pain is affecting pt, decreased gait speed Stairs / Additional Locomotion Stairs: Yes Stairs Assistance: Supervision/Verbal cueing Stair Management Technique: Two rails Number of Stairs: 12 Height of Stairs: (6" + 3") Ramp: Supervision/Verbal cueing(ambulatory with RW) Wheelchair Mobility Wheelchair Mobility: No   Trunk/Postural Assessment  Cervical Assessment Cervical Assessment: Within Functional Limits Thoracic Assessment Thoracic Assessment: Within Functional Limits Postural Control Postural Control: Deficits on evaluation Righting Reactions: delayed Protective Responses: delayed   Balance Balance Balance Assessed: Yes Static Standing Balance Static Standing - Level of Assistance: 5: Stand by assistance(no UE supported, standing while changing brief)  Dynamic Standing Balance Dynamic Standing - Balance Support: Bilateral upper extremity supported Dynamic Standing - Level of Assistance: 5: Stand by assistance(BUE on RW during gait)   Extremity Assessment  BUE WFL  BLE grossly 4/5, pt with hx of L knee arthritis that can be limiting at times 2/2 pain (pt reports she  uses a compression sleeve for pain management at home).   Waunita Schooner 06/20/2019, 12:52 PM

## 2019-06-20 NOTE — Progress Notes (Signed)
  Patient ID: AZADEH KALICKI, female   DOB: 02/08/1956, 63 y.o.   MRN: [id number]      Diagnosis codes:  G04.90;  G91.0;  R53.81  Height: 5'6"           Weight:  219 lbs     Patient suffers from debility following hydrocephalus, ventriculitis and VP shunt placement which impairs her ability to perform daily activities like bathing, dressing and mobility in the home.  A walker will not resolve issue with performing activities of daily living.  A wheelchair will allow patient to safely perform daily activities.  Patient is not able to propel themselves in the home using a standard weight wheelchair due to significant weakness.  Patient can self propel in the lightweight wheelchair.  Reesa Chew, PA-C

## 2019-06-20 NOTE — Progress Notes (Signed)
[facility] PHYSICAL MEDICINE & REHABILITATION PROGRESS NOTE   Subjective/Complaints: Feeling better. Excited to be going home  ROS: Patient denies fever, rash, sore throat, blurred vision, nausea, vomiting, diarrhea, cough, shortness of breath or chest pain, joint or back pain, headache, or mood change.   Objective:   No results found. Recent Labs    06/20/19 0637  WBC 5.2  HGB 7.2*  HCT 24.6*  PLT 292   Recent Labs    06/20/19 0637  NA 138  K 3.5  CL 105  CO2 21*  GLUCOSE 156*  BUN 6*  CREATININE 1.18*  CALCIUM 8.7*    Intake/Output Summary (Last 24 hours) at 06/20/2019 1126 Last data filed at 06/20/2019 0900 Gross per 24 hour  Intake 600 ml  Output -  Net 600 ml     Physical Exam: Vital Signs Blood pressure 139/81, pulse 81, temperature 98 F (36.7 C), temperature source Oral, resp. rate 15, height 5\' 6"  (1.676 m), weight 99.5 kg, SpO2 100 %. Constitutional: No distress . Vital signs reviewed. HEENT: EOMI, oral membranes moist Neck: supple Cardiovascular: RRR without murmur. No JVD    Respiratory: CTA Bilaterally without wheezes or rales. Normal effort    GI: BS +, non-tender, non-distended  Psych: Normal mood.  Normal behavior. Musc: No edema in extremities.  No tenderness in extremities. Neuro: Alert Motor: 4+-5/5 throughout, stable Skin: incisions all healing, small area of scab RLQ   Assessment/Plan: 1. 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  Physiatrist is providing close team supervision and 24 hour management of active medical problems listed below.  Physiatrist and rehab team continue to assess barriers to discharge/monitor patient progress toward functional and medical goals  Care Tool:  Bathing    Body parts bathed by patient: Right  arm, Left arm, Chest, Abdomen, Front perineal area, Buttocks, Left upper leg, Right upper leg, Face     Body parts n/a: Right lower leg, Left lower leg   Bathing assist Assist Level: Supervision/Verbal cueing     Upper Body Dressing/Undressing Upper body dressing   What is the patient wearing?: Bra, Pull over shirt    Upper body assist Assist Level: Supervision/Verbal cueing    Lower Body Dressing/Undressing Lower body dressing      What is the patient wearing?: Pants     Lower body assist Assist for lower body dressing: Supervision/Verbal cueing     Toileting Toileting    Toileting assist Assist for toileting: Supervision/Verbal cueing     Transfers Chair/bed transfer  Transfers assist     Chair/bed transfer assist level: Supervision/Verbal cueing     Locomotion Ambulation   Ambulation assist      Assist level: Supervision/Verbal cueing Assistive device: Walker-rolling Max distance: 150 ft   Walk 10 feet activity   Assist     Assist level: Supervision/Verbal cueing Assistive device: Walker-rolling   Walk 50 feet activity   Assist    Assist level: Supervision/Verbal cueing Assistive device: Walker-rolling    Walk 150 feet activity   Assist Walk 150 feet activity did not occur: Safety/medical concerns  Assist level: Supervision/Verbal cueing Assistive device: Walker-rolling    Walk 10 feet on uneven surface  activity   Assist Walk 10 feet on uneven surfaces activity did not occur: Safety/medical concerns   Assist level: Contact Guard/Touching assist Assistive device:  Walker-rolling   Wheelchair     Assist Will patient use wheelchair at discharge?: No             Wheelchair 50 feet with 2 turns activity    Assist            Wheelchair 150 feet activity     Assist          Blood pressure 139/81, pulse 81, temperature 98 F (36.7 C), temperature source Oral, resp. rate 15, height 5\' 6"  (1.676 m),  weight 99.5 kg, SpO2 100 %.  Medical Problem List and Plan:  1. 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 Ordering PT/OT and SLP for functional deficits.   Continue CIR  ELOS 10/13  Patient to see Rehab MD/provider in the office for transitional care encounter in 1-2 weeks.  2. Antithrombotics:   -DVT/anticoagulation: Pharmaceutical: Lovenox   -antiplatelet therapy: N/A  3. Pain Management: Tylenol as needed  4. Mood: LCSW to follow for evaluation and support   -antipsychotic agents:N/A  5. Neuropsych: This patient is capable of making decisions on own behalf. Need to determine who makes decisions for pt.  6. Skin/Wound Care:   Daily dressing changes   Nutrition improved 7. Fluids/Electrolytes/Nutrition: Discontinued IV fluids. Monitor intake/output.  8. HTN: Monitor blood pressures. Continue amlodipine daily    Vitals:   06/19/19 1913 06/20/19 0535  BP: 121/74 139/81  Pulse: 83 81  Resp: 16 15  Temp: 98.6 F (37 C) 98 F (36.7 C)  SpO2: 100% 100%  controlled 10/12 9. Anemia of chronic disease: Continue iron supplement twice daily  CBG (last 3) Hb 7.9 on 10/5, 7.2 10/12  -will re-check in am given that she's going home tomorrow Recent Labs    06/19/19 1705 06/19/19 2102 06/20/19 0629  GLUCAP 78 157* 144*   Reasonable control 10/12 10. T2DM with hyperglycemia: Was on glyburide PTA. Will continue to monitor blood sugars achs.   Hemoglobin A1c 7.4  Con't SSI  Glyburide 1.25mg  qam  11. Hydrocephalus s/p VP shunt and previous ventriculitis.: Monitor for fevers or recurrent drainage with increase in activity.  12. Impaired vision- will try to determine how much vision loss and if acute vs more chronic.  13. Impaired cognition- SLP for cognition, esp for word finding deficits and  overall cognition.  improving 14. CKD stage II  Cr.  1.06 on 10/5, 1.18 10/12---stable  Cont to monitor  LOS: 9 days A FACE TO FACE EVALUATION WAS PERFORMED  Meredith Staggers 06/20/2019, 11:26 AM

## 2019-06-21 LAB — GLUCOSE, CAPILLARY
Glucose-Capillary: 126 mg/dL — ABNORMAL HIGH (ref 70–99)
Glucose-Capillary: 199 mg/dL — ABNORMAL HIGH (ref 70–99)

## 2019-06-21 MED ORDER — AMLODIPINE BESYLATE 10 MG PO TABS: 10.0000 mg | ORAL_TABLET | Freq: Every day | ORAL | 0 refills | Status: AC

## 2019-06-21 MED ORDER — FERREX 150 150 MG PO CAPS: 150.0000 mg | ORAL_CAPSULE | Freq: Two times a day (BID) | ORAL | 0 refills | Status: AC

## 2019-06-21 MED ORDER — GLYBURIDE 1.25 MG PO TABS: 1.2500 mg | ORAL_TABLET | Freq: Every day | ORAL | 0 refills | Status: DC

## 2019-06-21 MED ORDER — GABAPENTIN 300 MG PO CAPS: 300.0000 mg | ORAL_CAPSULE | Freq: Two times a day (BID) | ORAL | 0 refills | Status: AC

## 2019-06-21 MED ORDER — ATORVASTATIN CALCIUM 40 MG PO TABS: 40.0000 mg | ORAL_TABLET | Freq: Every day | ORAL | 0 refills | Status: AC

## 2019-06-21 MED ORDER — SENNOSIDES-DOCUSATE SODIUM 8.6-50 MG PO TABS: 2.0000 | ORAL_TABLET | Freq: Two times a day (BID) | ORAL | 0 refills | Status: AC

## 2019-06-21 MED ORDER — GLYBURIDE 2.5 MG PO TABS: 2.5000 mg | ORAL_TABLET | Freq: Every day | ORAL | 0 refills | Status: AC

## 2019-06-21 MED ORDER — PANTOPRAZOLE SODIUM 40 MG PO TBEC: 40.0000 mg | DELAYED_RELEASE_TABLET | Freq: Every day | ORAL | 0 refills | Status: AC

## 2019-06-21 NOTE — Progress Notes (Signed)
Discharge instructions provided to pt by P.Love PA, pt verbalized an understanding of instructions, medications, and follow up appts.

## 2019-06-21 NOTE — Plan of Care (Signed)
Pt to discharge to home with personal property with equipment. Family to be here around 3pm today

## 2019-06-21 NOTE — Discharge Instructions (Signed)
Inpatient Rehab Discharge Instructions  EARLE SIERZEGA Discharge date and time:  06/21/19 Activities/Precautions/ Functional Status: Activity:  No lifting, driving, or strenuous exercise till cleared by MD Diet: Diabetic. Wound Care: Keep incisions.  Contact Dr. Zada Finders if you develop any problems with your incision/wound--redness, swelling, increase in pain, drainage or if you develop fever or chills.  clean and dry.    Functional status:  ___ No restrictions     ___ Walk up steps independently _X__ 24/7 supervision/assistance   ___ Walk up steps with assistance ___ Intermittent supervision/assistance  ___ Bathe/dress independently ___ Walk with walker     _X__ Bathe/dress with assistance ___ Walk Independently    ___ Shower independently ___ Walk with assistance    ___ Shower with assistance _X_ No alcohol     ___ Return to work/school ________   COMMUNITY REFERRALS UPON DISCHARGE:    Home Health:   PT     OT     ST                      Agency:  [facility]   Phone: [phone]   Medical Equipment/Items Ordered:  Wheelchair, cushion, walker, commode and tub bench                                                      Agency/Supplier:  [employer] @ [phone]    Special Instructions: 1. Monitor blood sugars twice a day--before meals and/or at bedtime. 2. Family to assist with medication/fiances management.   3. NO alcohol, aspirin or aspirin containing products or Ibuprofen/aleve/motrin.     My questions have been answered and I understand these instructions. I will adhere to these goals and the provided educational materials after my discharge from the hospital.  Patient/Caregiver Signature _______________________________ Date __________  Clinician Signature _______________________________________ Date __________  Please bring this form and your medication list with you to all your follow-up doctor's appointments.

## 2019-06-21 NOTE — Discharge Summary (Signed)
Physician Discharge Summary  Patient ID: [name] MRN: [id number] DOB/AGE: [birth year] 63 y.o.  Admit date: 06/11/2019 Discharge date: 06/21/2019  Discharge Diagnoses:  Principal Problem:   Cerebral ventriculitis Active Problems:   CKD (chronic kidney disease), stage II   Controlled type 2 diabetes mellitus with hyperglycemia, without long-term current use of insulin (HCC)   Benign essential HTN   Anemia of chronic disease   Discharged Condition: stable   Significant Diagnostic Studies: N/A   Labs:  Basic Metabolic Panel: BMP Latest Ref Rng & Units 06/20/2019 06/13/2019 06/10/2019  Glucose 70 - 99 mg/dL 156(H) 165(H) 200(H)  BUN 8 - 23 mg/dL 6(L) 6(L) 11  Creatinine 0.44 - 1.00 mg/dL 1.18(H) 1.06(H) 1.08(H)  Sodium 135 - 145 mmol/L 138 137 135  Potassium 3.5 - 5.1 mmol/L 3.5 3.7 3.8  Chloride 98 - 111 mmol/L 105 105 102  CO2 22 - 32 mmol/L 21(L) 20(L) 22  Calcium 8.9 - 10.3 mg/dL 8.7(L) 8.9 9.3    CBC: CBC Latest Ref Rng & Units 06/20/2019 06/13/2019 05/25/2019  WBC 4.0 - 10.5 K/uL 5.2 6.6 5.9  Hemoglobin 12.0 - 15.0 g/dL 7.2(L) 7.9(L) 8.5(L)  Hematocrit 36.0 - 46.0 % 24.6(L) 25.8(L) 28.8(L)  Platelets 150 - 400 K/uL 292 228 273    CBG: Recent Labs  Lab 06/20/19 1202 06/20/19 1642 06/20/19 2153 06/21/19 0611 06/21/19 1137  GLUCAP 146* 158* 223* 126* 199*    Brief HPI:   [name] is a 63 y.o. 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  She was admitted on 04/15/2019 for repeat endoscopic transsphenoidal resection of tumor by Drs. Ostergaard and [employer].  Postop has had issues with confusion, cognitive and visual deficits.  She was found to have CSF leak and was taken to the OR for placement of lumbar drain on 08/16.  Follow-up CT head of zero 8/19 showed large pneumocephalus with mass-effect  right frontal lobe and diffuse cerebral sulcal effacement as well as posterior displacement of brainstem.  This was thought to be secondary to drain therefore drain was clamped and she was taken back to the OR on 08/21 for repair of CSF leak with abdominal wound revision.  She did have issues with recurrent fevers as well as recurrent nasal drainage therefore was started on empiric antibiotics and placed on bedrest.   On 09/06 she developed hypoxia with fevers and tachycardia.  CSF smear showing 830 WBC and elevated protein.  ID was consulted for input and she was started on vancomycin and cefepime for treatment of bacterial meningitis.  She continues to have fevers but blood cultures and CSF cultures were negative.  Fevers were thought to be have been due to antibiotics therefore this was DC'd.  She was afebrile off of antibiotics and was taken back to the OR on 9/22 for removal of lumbar drain and placement of VP shunt.  Shunt was dialed down on zero 9/25 due to enlargement of ventricles.  Her mentation has slowly been improving however she continued to be limited by balance deficits with shuffling gait, dizziness as well as difficulty sequencing with basic tasks.  CIR was recommended due to functional decline   Hospital Course: [name] was admitted to rehab 06/11/2019 for inpatient therapies to consist of PT, ST and OT at least three hours five days a week. Past admission physiatrist, therapy team and rehab RN have worked together  to provide customized collaborative inpatient rehab.  Crani incision has healed well without any signs or symptoms of infection and sutures were removed on 10/10 without difficulty  Blood pressures have been monitored on TID basis and has been stable.  Follow-up CBC showed acute blood loss anemia which is stable and she is to continue on iron supplement at discharge.  Serial check of lites show mild elevation in serum creatinine otherwise within normal limits.  Abnormal  LFTs have resolved.  Diabetes has been monitored with a [facility] CBG checks.  Glyburide was resumed at home dose and titrated up to 2.5 mg at discharge to help with tighter blood sugar control.   She is continent of bowel and bladder. She has made gains during rehab stay and is currently at supervision level for mobility and requires min assist with high-level cognitive tasks. She will continue to receive follow up [place] [place] [facility] after discharge.   Rehab course: During patient's stay in rehab weekly team conferences were held to monitor patient's progress, set goals and discuss barriers to discharge. At admission, patient required mod assist with ADL tasks and min assist with mobility. She exhibited deficits in executive functioning and complex problem-solving, high-level word finding, selective attention and short-term recall.  She  has had improvement in activity tolerance, balance, postural control as well as ability to compensate for deficits. She is able to complete ADL tasks with supervision. She requires supervision for transfers and to ambulate 150' with RW and verbal cues. She requires min assist to supervision with cognitive tasks. Family education completed in person with daughter and on phone with sister regarding all aspects of safety with mobility and safety.     Disposition: Home  Diet: Carb modified medium  Special Instructions: 1.  No driving or strenuous activity till cleared by MD. 2.  Monitor blood sugars AC/at bedtime and follow-up with PCP for further titration of medication 3.  Recommend repeat CBC in 1 week for follow-up on anemia Discharge Instructions    Ambulatory referral to Physical Medicine Rehab   Complete by: As directed    1-2 weeks transitional care appt     Allergies as of 06/21/2019   No Known Allergies     Medication List    STOP taking these medications   metFORMIN 500 MG 24 hr tablet Commonly known as: GLUCOPHAGE-XR   tiZANidine  4 MG tablet Commonly known as: ZANAFLEX     TAKE these medications   acetaminophen 325 MG tablet Commonly known as: TYLENOL Take 1-2 tablets (325-650 mg total) by mouth every 4 (four) hours as needed for mild pain.   amLODipine 10 MG tablet Commonly known as: NORVASC Take 1 tablet (10 mg total) by mouth daily.   atorvastatin 40 MG tablet Commonly known as: LIPITOR Take 1 tablet (40 mg total) by mouth daily.   Ferrex 150 150 MG capsule Generic drug: iron polysaccharides Take 1 capsule (150 mg total) by mouth 2 (two) times daily.   gabapentin 300 MG capsule Commonly known as: NEURONTIN Take 1 capsule (300 mg total) by mouth 2 (two) times daily. What changed: when to take this   glucose monitoring kit monitoring kit One Touch Ultra machine and strips, patient tests her blood sugars bid   glyBURIDE 2.5 MG tablet Commonly known as: DIABETA Take 1 tablet (2.5 mg total) by mouth daily with breakfast.   ONE TOUCH ULTRA TEST test strip Generic drug: glucose blood   pantoprazole 40 MG tablet  Commonly known as: PROTONIX Take 1 tablet (40 mg total) by mouth daily.   senna-docusate 8.6-50 MG tablet Commonly known as: Senokot-S Take 2 tablets by mouth 2 (two) times daily. For constipation   sodium chloride 0.65 % Soln nasal spray Commonly known as: OCEAN Place 4 sprays into both nostrils as needed for congestion.   Vitamin D (Ergocalciferol) 1.25 MG (50000 UT) Caps capsule Commonly known as: DRISDOL Take 50,000 Units by mouth every Sunday.      Follow-up Information    Meredith Staggers, MD Follow up.   Specialty: Physical Medicine and Rehabilitation Why: Office will call you with follow up appointment Contact information: [address] [place] [address] [phone]        Chesley Noon, MD. Call on 06/28/2019.   Specialty: Family Medicine Why: 11am for post hospital follow up with Vicenta Aly NP.  Stay in vehicle, call them when you get  there and they will come to get you.  Contact information: [place] [address] [phone]        Judith Part, MD. Call on 06/22/2019.   Specialty: Neurosurgery Why: for post op check.  Contact information: [place] [address] [phone]           Signed: Bary Leriche 06/22/2019, 9:47 PM

## 2019-06-21 NOTE — Progress Notes (Signed)
[facility] PHYSICAL MEDICINE & REHABILITATION PROGRESS NOTE   Subjective/Complaints: No new issues. Excited to go home  ROS: Patient denies fever, rash, sore throat, blurred vision, nausea, vomiting, diarrhea, cough, shortness of breath or chest pain, joint or back pain, headache, or mood change.    Objective:   No results found. Recent Labs    06/20/19 0637  WBC 5.2  HGB 7.2*  HCT 24.6*  PLT 292   Recent Labs    06/20/19 0637  NA 138  K 3.5  CL 105  CO2 21*  GLUCOSE 156*  BUN 6*  CREATININE 1.18*  CALCIUM 8.7*    Intake/Output Summary (Last 24 hours) at 06/21/2019 1201 Last data filed at 06/21/2019 0900 Gross per 24 hour  Intake 712 ml  Output -  Net 712 ml     Physical Exam: Vital Signs Blood pressure 110/66, pulse 79, temperature 98 F (36.7 C), resp. rate 18, height 5\' 6"  (1.676 m), weight 99.5 kg, SpO2 100 %. Constitutional: No distress . Vital signs reviewed. HEENT: EOMI, oral membranes moist Neck: supple Cardiovascular: RRR without murmur. No JVD    Respiratory: CTA Bilaterally without wheezes or rales. Normal effort    GI: BS +, non-tender, non-distended  Psych: Normal mood.  Normal behavior. Musc: No edema in extremities.  No tenderness in extremities. Neuro: Alert Motor: 4+-5/5 throughout, stable Skin: incisions all healing, small area of scab RLQ  dry   Assessment/Plan: 1. 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  Physiatrist is providing close team supervision and 24 hour management of active medical problems listed below.  Physiatrist and rehab team continue to assess barriers to discharge/monitor patient progress toward functional and medical goals  Care Tool:  Bathing    Body parts bathed by patient: Right arm, Left arm, Chest,  Abdomen, Front perineal area, Buttocks, Left upper leg, Right upper leg, Face, Right lower leg, Left lower leg     Body parts n/a: Right lower leg, Left lower leg   Bathing assist Assist Level: Supervision/Verbal cueing     Upper Body Dressing/Undressing Upper body dressing   What is the patient wearing?: Bra, Pull over shirt    Upper body assist Assist Level: Supervision/Verbal cueing    Lower Body Dressing/Undressing Lower body dressing      What is the patient wearing?: Pants, Underwear/pull up     Lower body assist Assist for lower body dressing: Supervision/Verbal cueing     Toileting Toileting    Toileting assist Assist for toileting: Supervision/Verbal cueing     Transfers Chair/bed transfer  Transfers assist     Chair/bed transfer assist level: Supervision/Verbal cueing     Locomotion Ambulation   Ambulation assist      Assist level: Supervision/Verbal cueing Assistive device: Walker-rolling Max distance: 150 ft   Walk 10 feet activity   Assist     Assist level: Supervision/Verbal cueing Assistive device: Walker-rolling   Walk 50 feet activity   Assist    Assist level: Supervision/Verbal cueing Assistive device: Walker-rolling    Walk 150 feet activity   Assist Walk 150 feet activity did not occur: Safety/medical concerns  Assist level: Supervision/Verbal cueing Assistive device: Walker-rolling    Walk 10 feet on uneven surface  activity   Assist Walk 10 feet on uneven surfaces activity did not occur: Safety/medical concerns  Assist level: Contact Guard/Touching assist Assistive device: Aeronautical engineer Will patient use wheelchair at discharge?: No   Wheelchair activity did not occur: N/A(pt ambulatory)         Wheelchair 50 feet with 2 turns activity    Assist    Wheelchair 50 feet with 2 turns activity did not occur: N/A       Wheelchair 150 feet activity     Assist   Wheelchair 150 feet activity did not occur: N/A       Blood pressure 110/66, pulse 79, temperature 98 F (36.7 C), resp. rate 18, height 5\' 6"  (1.676 m), weight 99.5 kg, SpO2 100 %.  Medical Problem List and Plan:  1. 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 Ordering PT/OT and SLP for functional deficits.   Continue CIR  Dc home today  Patient to see Rehab MD/provider in the office for transitional care encounter in 1-2 weeks.  2. Antithrombotics:   -DVT/anticoagulation: Pharmaceutical: Lovenox   -antiplatelet therapy: N/A  3. Pain Management: Tylenol as needed  4. Mood: LCSW to follow for evaluation and support   -antipsychotic agents:N/A  5. Neuropsych: This patient is capable of making decisions on own behalf. Need to determine who makes decisions for pt.  6. Skin/Wound Care:   Daily dressing changes   Nutrition improved 7. Fluids/Electrolytes/Nutrition: Discontinued IV fluids. Monitor intake/output.  8. HTN: Monitor blood pressures. Continue amlodipine daily    Vitals:   06/20/19 1959 06/21/19 0404  BP: 129/77 110/66  Pulse: 89 79  Resp: 18 18  Temp: 98.5 F (36.9 C) 98 F (36.7 C)  SpO2: 100% 100%  controlled 10/13 9. Anemia of chronic disease: Continue iron supplement twice daily  CBG (last 3) Hb 7.9 on 10/5, 7.2 10/12  -follow up as outpt--next week Recent Labs    06/20/19 2153 06/21/19 0611 06/21/19 1137  GLUCAP 223* 126* 199*    10. T2DM with hyperglycemia: Was on glyburide PTA. Will continue to monitor blood sugars achs.   Hemoglobin A1c 7.4  Con't SSI  Glyburide 1.25mg  qam --will need titration as outpt   11. Hydrocephalus s/p VP shunt and previous ventriculitis.: Monitor for fevers or recurrent drainage with increase in activity.  12. Impaired vision- will  try to determine how much vision loss and if acute vs more chronic.  13. Impaired cognition- SLP for cognition, esp for word finding deficits and overall cognition.  improving 14. CKD stage II  Cr.  1.06 on 10/5, 1.18 10/12---stable  Cont to monitor  LOS: 10 days A FACE TO FACE EVALUATION WAS PERFORMED  Meredith Staggers 06/21/2019, 12:01 PM

## 2019-06-21 NOTE — Progress Notes (Signed)
Social Work Discharge Note   The overall goal for the admission was met for:   Discharge location: Yes - home with father   Length of Stay: Yes - 10 days  Discharge activity level: Yes - supervision  Home/community participation: Yes  Services provided included: MD, RD, PT, OT, SLP, RN, TR, Pharmacy and [place]: Private Insurance: [place]  Follow-up services arranged: Home Health: PT, OT, ST via [facility], DME: 20x18 lightweight w/c, cushion, rolling walker, 3n1 commode and tub bench via [facility] and Patient/Family has no preference for HH/DME agencies  Comments (or additional information):    Contact info:  Walker Shadow @ [phone]  Patient/Family verbalized understanding of follow-up arrangements: Yes  Individual responsible for coordination of the follow-up plan: pt  Confirmed correct DME delivered: Lennart Pall 06/21/2019    Luvena Wentling

## 2019-06-23 ENCOUNTER — Telehealth: Payer: Self-pay | Admitting: Registered Nurse

## 2019-06-23 NOTE — Telephone Encounter (Signed)
Transitional Care call Transitional Questions Answered by Ms Milus Glazier: Sister  Patient name: [name]  DOB: 01-09-1956 1. Are you/is patient experiencing any problems since coming home? No a. Are there any questions regarding any aspect of care? No 2. Are there any questions regarding medications administration/dosing? No a. Are meds being taken as prescribed? Yes b. "Patient should review meds with caller to confirm" Medication List Reviewed, 3. Have there been any falls? No 4. Has Home Health been to the house and/or have they contacted you? Yes, [facility] a. If not, have you tried to contact them? NA b. Can we help you contact them? NA 5. Are bowels and bladder emptying properly? Yes a. Are there any unexpected incontinence issues? No b. If applicable, is patient following bowel/bladder programs? No 6. Any fevers, problems with breathing, unexpected pain? No 7. Are there any skin problems or new areas of breakdown? No 8. Has the patient/family member arranged specialty MD follow up (ie cardiology/neurology/renal/surgical/etc.)?  Ms. Milus Glazier was instructed to call Dr. Melford Aase and Dr. Zada Finders for [name], she verbalizes understanding. a. Can we help arrange? NA 9. Does the patient need any other services or support that we can help arrange? No 10. Are caregivers following through as expected in assisting the patient? Yes 11. Has the patient quit smoking, drinking alcohol, or using drugs as recommended? Ms. Milus Glazier states [name] doesn't smoke, drink alcohol or use illicit drugs.   Appointment date/time 07/04/2019  arrival time 09:20 for 09:40 appointment with Bayard Hugger ANP-C. At [facility]

## 2019-06-27 ENCOUNTER — Telehealth: Payer: Self-pay

## 2019-06-27 NOTE — Telephone Encounter (Signed)
Flor, PT from [facility] called requesting verbal order HHPT 2wk4. Orders approved and given.

## 2019-06-28 ENCOUNTER — Telehealth: Payer: Self-pay

## 2019-06-28 NOTE — Telephone Encounter (Signed)
[name], ST from [facility] called requesting verbal orders for HHST 1wk4. Orders approved and given.

## 2019-07-04 ENCOUNTER — Telehealth: Payer: Self-pay | Admitting: *Deleted

## 2019-07-04 ENCOUNTER — Encounter: Payer: Self-pay | Admitting: Registered Nurse

## 2019-07-04 ENCOUNTER — Other Ambulatory Visit: Payer: Self-pay

## 2019-07-04 ENCOUNTER — Encounter: Payer: BC Managed Care – PPO | Attending: Registered Nurse | Admitting: Registered Nurse

## 2019-07-04 VITALS — BP 119/76 | HR 97 | Temp 97.3°F | Ht 66.0 in | Wt 220.0 lb

## 2019-07-04 DIAGNOSIS — G0490 Encephalitis and encephalomyelitis, unspecified: Secondary | ICD-10-CM | POA: Diagnosis present

## 2019-07-04 DIAGNOSIS — I10 Essential (primary) hypertension: Secondary | ICD-10-CM | POA: Diagnosis present

## 2019-07-04 DIAGNOSIS — E1165 Type 2 diabetes mellitus with hyperglycemia: Secondary | ICD-10-CM | POA: Diagnosis present

## 2019-07-04 DIAGNOSIS — N182 Chronic kidney disease, stage 2 (mild): Secondary | ICD-10-CM | POA: Insufficient documentation

## 2019-07-04 NOTE — Progress Notes (Signed)
Subjective:    Patient ID: [name], female    DOB: September 22, 1955, 63 y.o.   MRN: [id number]  [id number] [name] is a 63 y.o. female who is here for transitional care visit in follow up of her cerebral ventriculitis, essential hypertension, Type 2 DM without long-term current use of insulin and CKD Stage 2.   [name] was admitted on 04/15/2019 for repeat resection of adenoma tumor by Dr. Zada Finders and Dr. Constance Holster.   CT Head WO Contrast: On 04/23/2019 IMPRESSION: 1. 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 2. 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  She was found to have a CSF leak , on 04/24/2019, she was taken to the OR for Lumbar Drain Placement by Dr. Ronnald Ramp.   CT Head WO Contrast: On 04/27/2019 IMPRESSION: Since head CT 04/23/2019, marked interval increase in now large volume pneumocephalus. This includes large volume intraventricular pneumocephalus distending the lateral and third ventricles. Mass effect upon the right frontal lobe, diffuse cerebral sulcal effacement and posterior displacement of the brainstem. Suspect the skull base defect from prior transsphenoidal pituitary lesion resection as the etiology for the pneumocephalus. No midline shift. On 04/29/2019 she underwent: By Dr. Zada Finders Endoscopic Endonasal Cerebrospinal Fluid Leak repair, Abdominal wound revision and fat graft harvest, Intrathecal Fluorocein Injection. On 05/18/2019 she underwent: Placement of Lumbar Drain by Dr. Arnoldo Morale.  On 05/31/2019 she underwent : By Dr. Zada Finders:  SHUNT INSERTION VENTRICULAR-PERITONEAL; [place]  She was admitted to inpatient Rehabilitation on 06/11/2019 and discharged home on  06/21/2019. She is receiving outpatient therapy by [facility]. She denies any pain. She rated her pain 0. Also reports she has a  good appetite.   Pain Inventory Average Pain 0 Pain Right Now 0 My pain is na  In the last 24 hours, has pain interfered with the following? General activity 0 Relation with others 0 Enjoyment of life 0 What TIME of day is your pain at its worst? na Sleep (in general) Good  Pain is worse with: na Pain improves with: na Relief from Meds: na  Mobility walk with assistance use a walker ability to climb steps?  yes do you drive?  no  Function employed # of hrs/week 40  Neuro/Psych trouble walking  Prior Studies Any changes since last visit?  no  Physicians involved in your care Any changes since last visit?  no   Family History  Problem Relation Age of Onset  . Hypertension Mother   . Diabetes type II Mother   . Breast cancer Mother        dx ~7; deceased 83  . Hypertension Father   . Breast cancer Maternal Aunt        deceased 9  . Cancer Maternal Uncle        unk. type; deceased 32s  . Cancer Maternal Uncle        unk. type; deceased late 82s  . Uterine cancer Cousin 17       daughter of an unaffected mat aunt who is 74   Social History   Socioeconomic History  . Marital status: Widowed    Spouse name: Not on file  . Number of children: Not on file  . Years of education: Not on file  . Highest education level: Not on file  Occupational History  . Not on file  Social Needs  . Emergency planning/management officer  strain: Not on file  . Food insecurity    Worry: Not on file    Inability: Not on file  . Transportation needs    Medical: Not on file    Non-medical: Not on file  Tobacco Use  . Smoking status: Former Smoker    Types: Cigarettes    Quit date: 01/31/1994    Years since quitting: 25.4  . Smokeless tobacco: Never Used  Substance and Sexual Activity  . Alcohol use: Not Currently    Comment: occasional wine  . Drug use: No  . Sexual activity: Not on file    Comment: menarche age 60, first live birth age 32, P75, TAH-BSO age 12   Lifestyle  . Physical  activity    Days per week: Not on file    Minutes per session: Not on file  . Stress: Not on file  Relationships  . Social Herbalist on phone: Not on file    Gets together: Not on file    Attends religious service: Not on file    Active member of club or organization: Not on file    Attends meetings of clubs or organizations: Not on file    Relationship status: Not on file  Other Topics Concern  . Not on file  Social History Narrative  . Not on file   Past Surgical History:  Procedure Laterality Date  . ABDOMINAL HYSTERECTOMY  1993   endometriosis  . BREAST BIOPSY Left 12/29/2013  . BREAST LUMPECTOMY Right 02/08/2014  . BREAST LUMPECTOMY WITH AXILLARY LYMPH NODE BIOPSY Right 02/08/14   invasive ductal  . CRANIOTOMY N/A 04/15/2019   Procedure: Transphenoid resection of pituitary tumor with fat graft;  Surgeon: Judith Part, MD;  Location: [facility];  Service: Neurosurgery;  Laterality: N/A;  . IR IMAGE GUIDED FLUID DRAIN BY CATHETER  04/19/2019  . MULTIPLE TOOTH EXTRACTIONS    . PLACEMENT OF LUMBAR DRAIN N/A 04/24/2019   Procedure: PLACEMENT OF LUMBAR DRAIN;  Surgeon: Eustace Moore, MD;  Location: [facility];  Service: Neurosurgery;  Laterality: N/A;  . PLACEMENT OF LUMBAR DRAIN N/A 05/16/2019   Procedure: PLACEMENT OF LUMBAR DRAIN;  Surgeon: Newman Pies, MD;  Location: [facility];  Service: Neurosurgery;  Laterality: N/A;  PLACEMENT OF LUMBAR DRAIN  . PORT-A-CATH REMOVAL    . PORTACATH PLACEMENT Left 02/08/2014   Procedure: INSERTION PORT-A-CATH;  Surgeon: Rolm Bookbinder, MD;  Location: [facility];  Service: General;  Laterality: Left;  . REPAIR OF CEREBROSPINAL FLUID LEAK N/A 04/29/2019   Procedure: Endoscopic Endonasal Cerebrospinal Fluid Leak repair, Abdominal wound revision and fat graft harvest, Intrathecal Fluorocein Injection;  Surgeon: Judith Part, MD;  Location: [facility];  Service: Neurosurgery;  Laterality: N/A;  . TONSILLECTOMY    . TRANSNASAL  APPROACH N/A 04/15/2019   Procedure: TRANSNASAL APPROACH;  Surgeon: [place] Belfast, MD;  Location: [facility];  Service: ENT;  Laterality: N/A;  . TRANSPHENOIDAL APPROACH EXPOSURE N/A 04/15/2019   Procedure: ENDOSCOPIC TRANS NASAL APPROACH WITH FUSIONN;  Surgeon: [place] Belfast, MD;  Location: [facility];  Service: ENT;  Laterality: N/A;  . TRANSPHENOIDAL APPROACH EXPOSURE N/A 04/29/2019   Procedure: Endoscopic repair of spinal fluid leak ;  Surgeon: [place] Belfast, MD;  Location: [facility];  Service: ENT;  Laterality: N/A;  . TRANSPHENOIDAL PITUITARY RESECTION  2006  . VENTRICULOPERITONEAL SHUNT Right 05/31/2019   Procedure: SHUNT INSERTION VENTRICULAR-PERITONEAL; REMOVAL OF LUMBAR DRAIN;  Surgeon: Judith Part, MD;  Location: [facility];  Service: Neurosurgery;  Laterality: Right;  SHUNT INSERTION VENTRICULAR-PERITONEAL; REMOVAL OF LUMBAR DRAIN  . WISDOM TOOTH EXTRACTION     Past Medical History:  Diagnosis Date  . Anemia   . Breast cancer ([place]) 12/29/13   right 6:00 o'clock, lower outer  . Cataract of left eye   . Diabetes mellitus    type 2  . Family history of malignant neoplasm of breast   . Glaucoma    MD just watching, no eye drops  . High cholesterol   . History of blood transfusion 2015   with chemo treatments  . History of radiation therapy 08/12/14- 10/11/14   right breast /50.4 Gy/28 fx, right breast boost/ 10 Gy/ 5 fx  . HTN (hypertension)   . Personal history of chemotherapy   . Personal history of radiation therapy   . Pituitary adenoma ([place]) 10/30/2018  . Wears dentures    full top-partial bottom  . Wears dentures    full  . Wears glasses   . Wears glasses    BP 119/76   Pulse 97   Temp (!) 97.3 F (36.3 Penny)   Ht 5\' 6"  (1.676 m)   Wt 220 lb (99.8 kg)   LMP  (LMP Unknown)   SpO2 97%   BMI 35.51 kg/m   Opioid Risk Score:   Fall Risk Score:  `1  Depression screen PHQ 2/9  Depression screen PHQ 2/9 08/11/2014  Decreased Interest 0  Down, Depressed, Hopeless 0  PHQ -  2 Score 0  Some recent data might be hidden    Review of Systems  Constitutional: Negative.   HENT: Negative.   Eyes: Negative.   Respiratory: Negative.   Cardiovascular: Negative.   Gastrointestinal: Negative.   Endocrine: Negative.   Genitourinary: Negative.   Musculoskeletal: Positive for gait problem.  Skin: Negative.   Allergic/Immunologic: Negative.   Hematological: Negative.   Psychiatric/Behavioral: Negative.   All other systems reviewed and are negative.      Objective:   Physical Exam Vitals signs and nursing note reviewed.  Constitutional:      Appearance: Normal appearance.  Neck:     Musculoskeletal: Normal range of motion and neck supple.  Cardiovascular:     Rate and Rhythm: Normal rate and regular rhythm.     Pulses: Normal pulses.     Heart sounds: Normal heart sounds.  Pulmonary:     Effort: Pulmonary effort is normal.     Breath sounds: Normal breath sounds.  Musculoskeletal:     Comments: Normal Muscle Bulk and Muscle Testing Reveals:  Upper Extremities: Full ROM and Muscle Strength 4/5  Lower Extremities: Full ROM and Muscle Strength 5/5 Arises from Table slowly using walker for support Narrow Based  Gait   Skin:    General: Skin is warm and dry.  Neurological:     Mental Status: She is alert and oriented to person, place, and time.  Psychiatric:        Mood and Affect: Mood normal.        Behavior: Behavior normal.           Assessment & Plan:  1. Cerebral ventriculitis: Dr. Zada Finders Following. Continue Outpatient Therapy with [facility]. Continue to Monitor.  2. Essential hypertension: Continue current medication regimen. PCP Following. Continue to Monitor.  3. Type 2 DM without long-term current use of insulin: Continue current medication regimen. PCP Following.   4. CKD Stage 2: PCP Following. Continue to Monitor.   20 minutes of face  to face patient care time was spent during this visit. All questions were encouraged and  answered.   F/U with Dr Posey Pronto in 4-6 weeks

## 2019-07-04 NOTE — Patient Instructions (Signed)
Please call Dr. Zada Finders to schedule Hospital Follow Up: [place]

## 2019-07-04 NOTE — Telephone Encounter (Signed)
Patients family says she was seen today.  They would like a call back. They believe her wound has healed up on her stomach.  They would like to know if they can stop dressing the wound.

## 2019-07-05 ENCOUNTER — Telehealth: Payer: Self-pay | Admitting: Registered Nurse

## 2019-07-05 NOTE — Telephone Encounter (Signed)
Placed a call to Ms. [name], no answer. Left message to return the call.

## 2019-07-05 NOTE — Telephone Encounter (Signed)
Ms. Greenwalt called and was instructed to follow up with Dr. Zada Finders regarding her dressing, she verbalizes understanding.

## 2019-07-13 DIAGNOSIS — I129 Hypertensive chronic kidney disease with stage 1 through stage 4 chronic kidney disease, or unspecified chronic kidney disease: Secondary | ICD-10-CM

## 2019-07-13 DIAGNOSIS — Z853 Personal history of malignant neoplasm of breast: Secondary | ICD-10-CM

## 2019-07-13 DIAGNOSIS — D62 Acute posthemorrhagic anemia: Secondary | ICD-10-CM

## 2019-07-13 DIAGNOSIS — E669 Obesity, unspecified: Secondary | ICD-10-CM

## 2019-07-13 DIAGNOSIS — Z9181 History of falling: Secondary | ICD-10-CM

## 2019-07-13 DIAGNOSIS — D352 Benign neoplasm of pituitary gland: Secondary | ICD-10-CM | POA: Diagnosis not present

## 2019-07-13 DIAGNOSIS — D631 Anemia in chronic kidney disease: Secondary | ICD-10-CM

## 2019-07-13 DIAGNOSIS — Z7984 Long term (current) use of oral hypoglycemic drugs: Secondary | ICD-10-CM

## 2019-07-13 DIAGNOSIS — Z982 Presence of cerebrospinal fluid drainage device: Secondary | ICD-10-CM

## 2019-07-13 DIAGNOSIS — E785 Hyperlipidemia, unspecified: Secondary | ICD-10-CM

## 2019-07-13 DIAGNOSIS — Z48811 Encounter for surgical aftercare following surgery on the nervous system: Secondary | ICD-10-CM | POA: Diagnosis not present

## 2019-07-13 DIAGNOSIS — Z6835 Body mass index (BMI) 35.0-35.9, adult: Secondary | ICD-10-CM

## 2019-07-13 DIAGNOSIS — N183 Chronic kidney disease, stage 3 unspecified: Secondary | ICD-10-CM

## 2019-07-13 DIAGNOSIS — E1136 Type 2 diabetes mellitus with diabetic cataract: Secondary | ICD-10-CM

## 2019-07-13 DIAGNOSIS — E559 Vitamin D deficiency, unspecified: Secondary | ICD-10-CM

## 2019-07-13 DIAGNOSIS — E1122 Type 2 diabetes mellitus with diabetic chronic kidney disease: Secondary | ICD-10-CM

## 2019-07-13 DIAGNOSIS — M1991 Primary osteoarthritis, unspecified site: Secondary | ICD-10-CM

## 2019-07-13 DIAGNOSIS — D630 Anemia in neoplastic disease: Secondary | ICD-10-CM | POA: Diagnosis not present

## 2019-07-13 DIAGNOSIS — Z87891 Personal history of nicotine dependence: Secondary | ICD-10-CM

## 2019-07-13 DIAGNOSIS — H409 Unspecified glaucoma: Secondary | ICD-10-CM

## 2019-07-13 DIAGNOSIS — I7090 Unspecified atherosclerosis: Secondary | ICD-10-CM

## 2019-07-13 DIAGNOSIS — G910 Communicating hydrocephalus: Secondary | ICD-10-CM | POA: Diagnosis not present

## 2019-07-22 ENCOUNTER — Other Ambulatory Visit: Payer: Self-pay | Admitting: Physical Medicine and Rehabilitation

## 2019-07-24 ENCOUNTER — Other Ambulatory Visit: Payer: Self-pay | Admitting: Physical Medicine and Rehabilitation

## 2019-07-31 ENCOUNTER — Other Ambulatory Visit: Payer: Self-pay | Admitting: Physical Medicine and Rehabilitation

## 2019-07-31 DIAGNOSIS — G6289 Other specified polyneuropathies: Secondary | ICD-10-CM

## 2019-07-31 DIAGNOSIS — C50511 Malignant neoplasm of lower-outer quadrant of right female breast: Secondary | ICD-10-CM

## 2019-08-01 NOTE — Telephone Encounter (Signed)
Patient saw Danella Sensing, ANP on  07/04/2019 without mention of Continue gabapentin.

## 2019-08-03 ENCOUNTER — Telehealth: Payer: Self-pay

## 2019-08-03 NOTE — Telephone Encounter (Signed)
Eric PT [facility] called requesting verbal orders for 2x3.  Called him back and approved verbal orders

## 2019-08-03 NOTE — Telephone Encounter (Signed)
Placed a call to [name] regarding the gabapentin, awaiting a return call.

## 2019-08-09 ENCOUNTER — Encounter: Payer: BC Managed Care – PPO | Attending: Registered Nurse | Admitting: Physical Medicine & Rehabilitation

## 2019-08-09 DIAGNOSIS — E1165 Type 2 diabetes mellitus with hyperglycemia: Secondary | ICD-10-CM | POA: Insufficient documentation

## 2019-08-09 DIAGNOSIS — I10 Essential (primary) hypertension: Secondary | ICD-10-CM | POA: Insufficient documentation

## 2019-08-09 DIAGNOSIS — N182 Chronic kidney disease, stage 2 (mild): Secondary | ICD-10-CM | POA: Insufficient documentation

## 2019-08-09 DIAGNOSIS — G0490 Encephalitis and encephalomyelitis, unspecified: Secondary | ICD-10-CM | POA: Insufficient documentation

## 2019-09-05 ENCOUNTER — Other Ambulatory Visit: Payer: Self-pay

## 2019-09-05 ENCOUNTER — Ambulatory Visit
Admission: RE | Admit: 2019-09-05 | Discharge: 2019-09-05 | Disposition: A | Discharge status: Home / Self Care | Payer: BC Managed Care – PPO | Source: Ambulatory Visit | Attending: Family Medicine | Admitting: Family Medicine

## 2019-09-05 ENCOUNTER — Other Ambulatory Visit: Payer: Self-pay | Admitting: Family Medicine

## 2019-09-05 DIAGNOSIS — Z853 Personal history of malignant neoplasm of breast: Secondary | ICD-10-CM

## 2019-09-05 DIAGNOSIS — R921 Mammographic calcification found on diagnostic imaging of breast: Secondary | ICD-10-CM

## 2019-10-17 ENCOUNTER — Telehealth: Payer: Self-pay

## 2019-10-17 NOTE — Telephone Encounter (Signed)
Patients Sister Margaretha Sheffield (not on [place]) called requesting a continuation of verbal orders for home health PT to be given the patient after the patient moves back into her own residence.  Noted that patient has also NOT been seen in clinic since October 26th 2020 but does have a follow up on february 25th 2021.

## 2019-10-17 NOTE — Telephone Encounter (Signed)
Margaret, Publishing copy from [facility] called stating she is having difficulty with patients sister. Return call.

## 2019-10-17 NOTE — Telephone Encounter (Signed)
I have not seen this patient as outpatient yet.  I can discuss at follow up visit.  Thanks.

## 2019-10-18 NOTE — Telephone Encounter (Signed)
We will follow-up.  Thanks.

## 2019-10-18 NOTE — Telephone Encounter (Signed)
Ms. [place] Brigham, sister of patient called asking when [facility] going to come back out. A family member put her therapies on hold just days before she was due for a discharge on 08/23/2019 due to a Covid exposure. Even though the discharge date had already been discussed with patient and care giver. Case was closed after no re-cert after discharge date. Case would have to be reopened but  [facility] is currently not ot taking commercial policy patients at this time and was explained to sister.  [facility] [employer] offered info to [facility] and [employer]. Margaret at [employer] the info to sister then Joycelyn Schmid started getting very disturbing emails from the sister. Patient has an upcoming f/u appt.

## 2019-11-03 ENCOUNTER — Other Ambulatory Visit: Payer: Self-pay

## 2019-11-03 ENCOUNTER — Encounter: Payer: BC Managed Care – PPO | Attending: Registered Nurse | Admitting: Physical Medicine & Rehabilitation

## 2019-11-03 ENCOUNTER — Encounter: Payer: Self-pay | Admitting: Physical Medicine & Rehabilitation

## 2019-11-03 VITALS — BP 117/77 | HR 90 | Temp 97.2°F | Ht 66.0 in | Wt 225.0 lb

## 2019-11-03 DIAGNOSIS — E1165 Type 2 diabetes mellitus with hyperglycemia: Secondary | ICD-10-CM | POA: Diagnosis present

## 2019-11-03 DIAGNOSIS — H539 Unspecified visual disturbance: Secondary | ICD-10-CM | POA: Diagnosis not present

## 2019-11-03 DIAGNOSIS — N182 Chronic kidney disease, stage 2 (mild): Secondary | ICD-10-CM | POA: Diagnosis present

## 2019-11-03 DIAGNOSIS — I10 Essential (primary) hypertension: Secondary | ICD-10-CM | POA: Insufficient documentation

## 2019-11-03 DIAGNOSIS — G0490 Encephalitis and encephalomyelitis, unspecified: Secondary | ICD-10-CM | POA: Insufficient documentation

## 2019-11-03 DIAGNOSIS — R269 Unspecified abnormalities of gait and mobility: Secondary | ICD-10-CM | POA: Insufficient documentation

## 2019-11-03 NOTE — Progress Notes (Addendum)
Subjective:    Patient ID: [name], female    DOB: 1956/07/03, 64 y.o.   MRN: [id number]  [id number] [name] is a 64 y.o. female history of T2DM, breast cancer, pituitary tumor s/p resection in 08/2005, bacterial meningitis, VP shunt placement due to hydrocephalus, CSF leak, and ventriculitis presents for hospital follow up.  Last clinic visit on 07/04/2019 with NP, notes reviewed.  Sister supplements history. Since that time, BP is controlled. CBGs have been relatively controlled. She notes visual deficits are stable since surgery, limited vision in left eye.  They are following with Neurosurg before making an appointment with Optho. She notes cognition is nearly back to baseline.  Sister notes short term deficits. She has been staying with her father, which is handicap accessible.  Denies falls. She has completed therapies.   Pain Inventory Average Pain 0 Pain Right Now 0 My pain is na  In the last 24 hours, has pain interfered with the following? General activity 0 Relation with others 0 Enjoyment of life 0 What TIME of day is your pain at its worst? na Sleep (in general) Good  Pain is worse with: na Pain improves with: na Relief from Meds: na  Mobility walk with assistance use a walker ability to climb steps?  yes do you drive?  no  Function employed # of hrs/week FMLA  Neuro/Psych weakness tingling trouble walking dizziness confusion  Prior Studies Any changes since last visit?  no  Physicians involved in your care Any changes since last visit?  no   Family History  Problem Relation Age of Onset  . Hypertension Mother   . Diabetes type II Mother   . Breast cancer Mother        dx ~17; deceased 52  . Hypertension Father   . Breast cancer Maternal Aunt        deceased 64  . Cancer Maternal Uncle        unk. type; deceased 72s  . Cancer Maternal Uncle        unk. type; deceased late 80s  . Uterine cancer Cousin 26       daughter  of an unaffected mat aunt who is 72   Social History   Socioeconomic History  . Marital status: Widowed    Spouse Owens: Not on file  . Number of children: Not on file  . Years of education: Not on file  . Highest education level: Not on file  Occupational History  . Not on file  Tobacco Use  . Smoking status: Former Smoker    Types: Cigarettes    Quit date: 01/31/1994    Years since quitting: 25.7  . Smokeless tobacco: Never Used  Substance and Sexual Activity  . Alcohol use: Not Currently    Comment: occasional wine  . Drug use: No  . Sexual activity: Not on file    Comment: menarche age 5, first live birth age 26, P41, TAH-BSO age 58   Other Topics Concern  . Not on file  Social History Narrative  . Not on file   Social Determinants of Health   Financial Resource Strain:   . Difficulty of Paying Living Expenses: Not on file  Food Insecurity:   . Worried About Charity fundraiser in the Last Year: Not on file  . Ran Out of Food in the Last Year: Not on file  Transportation Needs:   . Lack of Transportation (Medical): Not on file  . Lack of Transportation (  Non-Medical): Not on file  Physical Activity:   . Days of Exercise per Week: Not on file  . Minutes of Exercise per Session: Not on file  Stress:   . Feeling of Stress : Not on file  Social Connections:   . Frequency of Communication with Friends and Family: Not on file  . Frequency of Social Gatherings with Friends and Family: Not on file  . Attends Religious Services: Not on file  . Active Member of Clubs or Organizations: Not on file  . Attends Archivist Meetings: Not on file  . Marital Status: Not on file   Past Surgical History:  Procedure Laterality Date  . ABDOMINAL HYSTERECTOMY  1993   endometriosis  . BREAST BIOPSY Left 12/29/2013  . BREAST LUMPECTOMY Right 02/08/2014  . BREAST LUMPECTOMY WITH AXILLARY LYMPH NODE BIOPSY Right 02/08/14   invasive ductal  . CRANIOTOMY N/A 04/15/2019    Procedure: Transphenoid resection of pituitary tumor with fat graft;  Surgeon: Judith Part, MD;  Location: [place];  Service: Neurosurgery;  Laterality: N/A;  . IR IMAGE GUIDED FLUID DRAIN BY CATHETER  04/19/2019  . MULTIPLE TOOTH EXTRACTIONS    . PLACEMENT OF LUMBAR DRAIN N/A 04/24/2019   Procedure: PLACEMENT OF LUMBAR DRAIN;  Surgeon: Eustace Moore, MD;  Location: [place];  Service: Neurosurgery;  Laterality: N/A;  . PLACEMENT OF LUMBAR DRAIN N/A 05/16/2019   Procedure: PLACEMENT OF LUMBAR DRAIN;  Surgeon: Newman Pies, MD;  Location: [place];  Service: Neurosurgery;  Laterality: N/A;  PLACEMENT OF LUMBAR DRAIN  . PORT-A-CATH REMOVAL    . PORTACATH PLACEMENT Left 02/08/2014   Procedure: INSERTION PORT-A-CATH;  Surgeon: Rolm Bookbinder, MD;  Location: [place];  Service: General;  Laterality: Left;  . REPAIR OF CEREBROSPINAL FLUID LEAK N/A 04/29/2019   Procedure: Endoscopic Endonasal Cerebrospinal Fluid Leak repair, Abdominal wound revision and fat graft harvest, Intrathecal Fluorocein Injection;  Surgeon: Judith Part, MD;  Location: [place];  Service: Neurosurgery;  Laterality: N/A;  . TONSILLECTOMY    . TRANSNASAL APPROACH N/A 04/15/2019   Procedure: TRANSNASAL APPROACH;  Surgeon: Jerrell Belfast, MD;  Location: [place];  Service: ENT;  Laterality: N/A;  . TRANSPHENOIDAL APPROACH EXPOSURE N/A 04/15/2019   Procedure: ENDOSCOPIC TRANS NASAL APPROACH WITH FUSIONN;  Surgeon: Jerrell Belfast, MD;  Location: [place];  Service: ENT;  Laterality: N/A;  . TRANSPHENOIDAL APPROACH EXPOSURE N/A 04/29/2019   Procedure: Endoscopic repair of spinal fluid leak ;  Surgeon: Jerrell Belfast, MD;  Location: [place];  Service: ENT;  Laterality: N/A;  . TRANSPHENOIDAL PITUITARY RESECTION  2006  . VENTRICULOPERITONEAL SHUNT Right 05/31/2019   Procedure: SHUNT INSERTION VENTRICULAR-PERITONEAL; REMOVAL OF LUMBAR DRAIN;  Surgeon: Judith Part, MD;  Location: [place];  Service: Neurosurgery;   Laterality: Right;  SHUNT INSERTION VENTRICULAR-PERITONEAL; REMOVAL OF LUMBAR DRAIN  . WISDOM TOOTH EXTRACTION     Past Medical History:  Diagnosis Date  . Anemia   . Breast cancer ([place]) 12/29/13   right 6:00 o'clock, lower outer  . Cataract of left eye   . Diabetes mellitus    type 2  . Family history of malignant neoplasm of breast   . Glaucoma    MD just watching, no eye drops  . High cholesterol   . History of blood transfusion 2015   with chemo treatments  . History of radiation therapy 08/12/14- 10/11/14   right breast /50.4 Gy/28 fx, right breast boost/ 10 Gy/ 5 fx  . HTN (hypertension)   .  Personal history of chemotherapy   . Personal history of radiation therapy   . Pituitary adenoma ([place]) 10/30/2018  . Wears dentures    full top-partial bottom  . Wears dentures    full  . Wears glasses   . Wears glasses    BP 117/77   Pulse 90   Temp (!) 97.2 F (36.2 C)   Ht 5\' 6"  (1.676 m)   Wt 225 lb (102.1 kg)   LMP  (LMP Unknown)   SpO2 97%   BMI 36.32 kg/m   Opioid Risk Score:   Fall Risk Score:  `1  Depression screen PHQ 2/9  Depression screen PHQ 2/9 08/11/2014  Decreased Interest 0  Down, Depressed, Hopeless 0  PHQ - 2 Score 0  Some recent data might be hidden    Review of Systems  Constitutional: Negative.   HENT: Negative.   Eyes: Negative.   Respiratory: Negative.   Cardiovascular: Negative.   Gastrointestinal: Negative.   Endocrine: Negative.   Genitourinary: Negative.   Musculoskeletal: Positive for gait problem.  Skin: Negative.   Allergic/Immunologic: Negative.   Neurological: Positive for dizziness, weakness and numbness.  Hematological: Negative.   Psychiatric/Behavioral: Positive for confusion.       Objective:    Constitutional: No distress.  Psych: Normal mood.  Normal behavior. Musc: No edema in extremities.  No tenderness in extremities. Neuro: Alert and oriented, some word finding difficulty Motor: 5/5 throughout    Assessment  & Plan:  Jenefer Kniceley is a 64 y.o. female history of T2DM, breast cancer, pituitary tumor s/p resection in 08/2005, bacterial meningitis, VP shunt placement due to hydrocephalus, CSF leak, and ventriculitis presents for hospital follow up.  1. 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   Will order PT/OT HH  Cont follow up with Neurosurg  Patient would like to transition to her own home, which is not handicap accessible  Cognition improving - continues to have word finding difficulty  2. HTN:   Controlled at present  Cont meds  3. T2DM with hyperglycemia:   Cont meds  Relatively controlled  4. Impaired vision  Left visual field deficits  Patient to follow up with Neurosurg in March  Consider referral to Optho afterward  5. Gait abnormality  PT ordered  Cont cane for safety

## 2019-11-07 ENCOUNTER — Telehealth: Payer: Self-pay

## 2019-11-07 DIAGNOSIS — H539 Unspecified visual disturbance: Secondary | ICD-10-CM

## 2019-11-07 DIAGNOSIS — R269 Unspecified abnormalities of gait and mobility: Secondary | ICD-10-CM

## 2019-11-07 DIAGNOSIS — G0490 Encephalitis and encephalomyelitis, unspecified: Secondary | ICD-10-CM

## 2019-11-07 NOTE — Telephone Encounter (Signed)
[name] who states she is patient POA called asking if orders has been sent to [facility] for [facility].

## 2019-11-08 NOTE — Telephone Encounter (Signed)
Orders placed.

## 2019-11-08 NOTE — Telephone Encounter (Signed)
Orders was put in for outpatient PT and OT but patient is needing HH therapies. Can you please put an order in for [facility] PT and OT?

## 2019-11-10 ENCOUNTER — Telehealth: Payer: Self-pay

## 2019-11-10 NOTE — Telephone Encounter (Signed)
Maggie from [facility] called needing updated med list, last note with diagnosis faxed to her. Notes has been faxed.

## 2019-11-16 ENCOUNTER — Telehealth: Payer: Self-pay

## 2019-11-16 NOTE — Telephone Encounter (Signed)
Nunzio Cory, PT from [facility] called requesting verbal orders for continuation of HHPT 1wk1,2wk3. Orders approved and given

## 2019-12-01 DIAGNOSIS — R4189 Other symptoms and signs involving cognitive functions and awareness: Secondary | ICD-10-CM | POA: Diagnosis not present

## 2019-12-01 DIAGNOSIS — D649 Anemia, unspecified: Secondary | ICD-10-CM | POA: Diagnosis not present

## 2019-12-01 DIAGNOSIS — E1136 Type 2 diabetes mellitus with diabetic cataract: Secondary | ICD-10-CM

## 2019-12-01 DIAGNOSIS — H547 Unspecified visual loss: Secondary | ICD-10-CM

## 2019-12-01 DIAGNOSIS — R2689 Other abnormalities of gait and mobility: Secondary | ICD-10-CM | POA: Diagnosis not present

## 2019-12-01 DIAGNOSIS — Z87891 Personal history of nicotine dependence: Secondary | ICD-10-CM

## 2019-12-01 DIAGNOSIS — G620 Drug-induced polyneuropathy: Secondary | ICD-10-CM

## 2019-12-01 DIAGNOSIS — D443 Neoplasm of uncertain behavior of pituitary gland: Secondary | ICD-10-CM | POA: Diagnosis not present

## 2019-12-01 DIAGNOSIS — R531 Weakness: Secondary | ICD-10-CM

## 2019-12-01 DIAGNOSIS — E7800 Pure hypercholesterolemia, unspecified: Secondary | ICD-10-CM

## 2019-12-01 DIAGNOSIS — Z853 Personal history of malignant neoplasm of breast: Secondary | ICD-10-CM

## 2019-12-01 DIAGNOSIS — H409 Unspecified glaucoma: Secondary | ICD-10-CM

## 2019-12-02 ENCOUNTER — Other Ambulatory Visit (HOSPITAL_COMMUNITY): Payer: Self-pay | Admitting: Neurological Surgery

## 2019-12-02 ENCOUNTER — Other Ambulatory Visit: Payer: Self-pay | Admitting: Neurological Surgery

## 2019-12-02 DIAGNOSIS — D352 Benign neoplasm of pituitary gland: Secondary | ICD-10-CM

## 2019-12-22 ENCOUNTER — Other Ambulatory Visit: Payer: Self-pay

## 2019-12-22 ENCOUNTER — Encounter: Payer: BC Managed Care – PPO | Attending: Registered Nurse | Admitting: Physical Medicine & Rehabilitation

## 2019-12-22 ENCOUNTER — Encounter: Payer: Self-pay | Admitting: Physical Medicine & Rehabilitation

## 2019-12-22 VITALS — BP 138/82 | HR 82 | Temp 97.7°F | Ht 66.0 in | Wt 233.0 lb

## 2019-12-22 DIAGNOSIS — E1165 Type 2 diabetes mellitus with hyperglycemia: Secondary | ICD-10-CM | POA: Insufficient documentation

## 2019-12-22 DIAGNOSIS — H539 Unspecified visual disturbance: Secondary | ICD-10-CM | POA: Diagnosis present

## 2019-12-22 DIAGNOSIS — I10 Essential (primary) hypertension: Secondary | ICD-10-CM | POA: Insufficient documentation

## 2019-12-22 DIAGNOSIS — R269 Unspecified abnormalities of gait and mobility: Secondary | ICD-10-CM | POA: Insufficient documentation

## 2019-12-22 DIAGNOSIS — G0490 Encephalitis and encephalomyelitis, unspecified: Secondary | ICD-10-CM | POA: Insufficient documentation

## 2019-12-22 DIAGNOSIS — N182 Chronic kidney disease, stage 2 (mild): Secondary | ICD-10-CM | POA: Diagnosis present

## 2019-12-22 NOTE — Progress Notes (Signed)
Subjective:    Patient ID: [name], female    DOB: 17-Feb-1956, 64 y.o.   MRN: [id number]  [id number] [name] is a 64 y.o. 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  Last clinic visit on 11/03/19.  Since that time, she states she finished HH therapies. She continues to follow up with Neurosurg. She notes improvement in cognition. She is still not back at home.  Denies falls. Visual deficits are stable.   Pain Inventory Average Pain 5 Pain Right Now 6 My pain is aching  In the last 24 hours, has pain interfered with the following? General activity 0 Relation with others 0 Enjoyment of life 0 What TIME of day is your pain at its worst? varies Sleep (in general) Good  Pain is worse with: unsure Pain improves with: rest Relief from Meds: na  Mobility walk with assistance use a cane ability to climb steps?  yes do you drive?  no  Function employed # of hrs/week FMLA  Neuro/Psych weakness tingling trouble walking dizziness  Prior Studies Any changes since last visit?  no  Physicians involved in your care Any changes since last visit?  no   Family History  Problem Relation Age of Onset  . Hypertension Mother   . Diabetes type II Mother   . Breast cancer Mother        dx ~41; deceased 54  . Hypertension Father   . Breast cancer Maternal Aunt        deceased 56  . Cancer Maternal Uncle        unk. type; deceased 37s  . Cancer Maternal Uncle        unk. type; deceased late 14s  . Uterine cancer Cousin 74       daughter of an unaffected mat aunt who is 55   Social History   Socioeconomic History  . Marital status: Widowed    Spouse name: Not on file  . Number of children: Not on file  . Years of education: Not on file  . Highest education level: Not on file  Occupational History  .  Not on file  Tobacco Use  . Smoking status: Former Smoker    Types: Cigarettes    Quit date: 01/31/1994    Years since quitting: 25.9  . Smokeless tobacco: Never Used  Substance and Sexual Activity  . Alcohol use: Not Currently    Comment: occasional wine  . Drug use: No  . Sexual activity: Not on file    Comment: menarche age 32, first live birth age 89, P41, TAH-BSO age 52   Other Topics Concern  . Not on file  Social History Narrative  . Not on file   Social Determinants of Health   Financial Resource Strain:   . Difficulty of Paying Living Expenses:   Food Insecurity:   . Worried About Charity fundraiser in the Last Year:   . Arboriculturist in the Last Year:   Transportation Needs:   . Film/video editor (Medical):   Marland Kitchen Lack of Transportation (Non-Medical):   Physical Activity:   . Days of Exercise per Week:   . Minutes of Exercise per Session:   Stress:   . Feeling of Stress :   Social Connections:   . Frequency of Communication with Friends and Family:   . Frequency of  Social Gatherings with Friends and Family:   . Attends Religious Services:   . Active Member of Clubs or Organizations:   . Attends Archivist Meetings:   Marland Kitchen Marital Status:    Past Surgical History:  Procedure Laterality Date  . ABDOMINAL HYSTERECTOMY  1993   endometriosis  . BREAST BIOPSY Left 12/29/2013  . BREAST LUMPECTOMY Right 02/08/2014  . BREAST LUMPECTOMY WITH AXILLARY LYMPH NODE BIOPSY Right 02/08/14   invasive ductal  . CRANIOTOMY N/A 04/15/2019   Procedure: Transphenoid resection of pituitary tumor with fat graft;  Surgeon: Judith Part, MD;  Location: [place];  Service: Neurosurgery;  Laterality: N/A;  . IR IMAGE GUIDED FLUID DRAIN BY CATHETER  04/19/2019  . MULTIPLE TOOTH EXTRACTIONS    . PLACEMENT OF LUMBAR DRAIN N/A 04/24/2019   Procedure: PLACEMENT OF LUMBAR DRAIN;  Surgeon: Eustace Moore, MD;  Location: [place];  Service: Neurosurgery;  Laterality: N/A;  .  PLACEMENT OF LUMBAR DRAIN N/A 05/16/2019   Procedure: PLACEMENT OF LUMBAR DRAIN;  Surgeon: Newman Pies, MD;  Location: [place];  Service: Neurosurgery;  Laterality: N/A;  PLACEMENT OF LUMBAR DRAIN  . PORT-A-CATH REMOVAL    . PORTACATH PLACEMENT Left 02/08/2014   Procedure: INSERTION PORT-A-CATH;  Surgeon: Rolm Bookbinder, MD;  Location: [place];  Service: General;  Laterality: Left;  . REPAIR OF CEREBROSPINAL FLUID LEAK N/A 04/29/2019   Procedure: Endoscopic Endonasal Cerebrospinal Fluid Leak repair, Abdominal wound revision and fat graft harvest, Intrathecal Fluorocein Injection;  Surgeon: Judith Part, MD;  Location: [place];  Service: Neurosurgery;  Laterality: N/A;  . TONSILLECTOMY    . TRANSNASAL APPROACH N/A 04/15/2019   Procedure: TRANSNASAL APPROACH;  Surgeon: Jerrell Belfast, MD;  Location: [place];  Service: ENT;  Laterality: N/A;  . TRANSPHENOIDAL APPROACH EXPOSURE N/A 04/15/2019   Procedure: ENDOSCOPIC TRANS NASAL APPROACH WITH FUSIONN;  Surgeon: Jerrell Belfast, MD;  Location: [place];  Service: ENT;  Laterality: N/A;  . TRANSPHENOIDAL APPROACH EXPOSURE N/A 04/29/2019   Procedure: Endoscopic repair of spinal fluid leak ;  Surgeon: Jerrell Belfast, MD;  Location: [place];  Service: ENT;  Laterality: N/A;  . TRANSPHENOIDAL PITUITARY RESECTION  2006  . VENTRICULOPERITONEAL SHUNT Right 05/31/2019   Procedure: SHUNT INSERTION VENTRICULAR-PERITONEAL; REMOVAL OF LUMBAR DRAIN;  Surgeon: Judith Part, MD;  Location: [place];  Service: Neurosurgery;  Laterality: Right;  SHUNT INSERTION VENTRICULAR-PERITONEAL; REMOVAL OF LUMBAR DRAIN  . WISDOM TOOTH EXTRACTION     Past Medical History:  Diagnosis Date  . Anemia   . Breast cancer ([place]) 12/29/13   right 6:00 o'clock, lower outer  . Cataract of left eye   . Diabetes mellitus    type 2  . Family history of malignant neoplasm of breast   . Glaucoma    MD just watching, no eye drops  . High cholesterol   . History of  blood transfusion 2015   with chemo treatments  . History of radiation therapy 08/12/14- 10/11/14   right breast /50.4 Gy/28 fx, right breast boost/ 10 Gy/ 5 fx  . HTN (hypertension)   . Personal history of chemotherapy   . Personal history of radiation therapy   . Pituitary adenoma ([place]) 10/30/2018  . Wears dentures    full top-partial bottom  . Wears dentures    full  . Wears glasses   . Wears glasses    BP 138/82   Pulse 82   Temp 97.7 F (36.5 C)   Ht 5\' 6"  (  1.676 m)   Wt 233 lb (105.7 kg)   LMP  (LMP Unknown)   SpO2 97%   BMI 37.61 kg/m   Opioid Risk Score:   Fall Risk Score:  `1  Depression screen PHQ 2/9  Depression screen PHQ 2/9 08/11/2014  Decreased Interest 0  Down, Depressed, Hopeless 0  PHQ - 2 Score 0  Some recent data might be hidden    Review of Systems  Constitutional: Negative.   HENT: Negative.   Eyes: Positive for visual disturbance.  Respiratory: Negative.   Cardiovascular: Negative.   Gastrointestinal: Negative.   Endocrine: Negative.   Genitourinary: Negative.   Musculoskeletal: Positive for arthralgias and gait problem.  Skin: Negative.   Allergic/Immunologic: Negative.   Neurological: Positive for weakness and numbness.  Hematological: Negative.   Psychiatric/Behavioral: Positive for confusion.       Objective:    Constitutional: NAD. Psych: Normal mood.  Normal behavior. Musc: No edema in extremities.  No tenderness in extremities. Neuro: Alert and oriented, some word finding difficulty, improving Motor: 5/5 throughout    Assessment & Plan:  [name] is a 64 y.o. 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  1. 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   Completed PT/OT HH, will refer for outpatient therapies  Cont follow up with Neurosurg  Patient would like to transition to her own home, which is not handicap accessible, still working on it  Cognition improving - word finding difficulties also improving   2. Impaired vision  Left visual field deficits persist  Continue follow up with Neurosurg   Will consider referral to Optho afterward  5. Gait abnormality  Outpatient PT ordered  Cont cane for safety

## 2019-12-26 ENCOUNTER — Ambulatory Visit (HOSPITAL_COMMUNITY): Payer: BC Managed Care – PPO

## 2020-01-31 ENCOUNTER — Ambulatory Visit: Payer: BC Managed Care – PPO

## 2020-01-31 ENCOUNTER — Encounter: Payer: Self-pay | Admitting: Occupational Therapy

## 2020-01-31 ENCOUNTER — Other Ambulatory Visit: Payer: Self-pay

## 2020-01-31 ENCOUNTER — Ambulatory Visit: Payer: BC Managed Care – PPO | Attending: Physical Medicine & Rehabilitation | Admitting: Occupational Therapy

## 2020-01-31 DIAGNOSIS — M6281 Muscle weakness (generalized): Secondary | ICD-10-CM | POA: Insufficient documentation

## 2020-01-31 DIAGNOSIS — R41842 Visuospatial deficit: Secondary | ICD-10-CM | POA: Insufficient documentation

## 2020-01-31 DIAGNOSIS — R2689 Other abnormalities of gait and mobility: Secondary | ICD-10-CM

## 2020-01-31 DIAGNOSIS — R41840 Attention and concentration deficit: Secondary | ICD-10-CM

## 2020-01-31 NOTE — Patient Instructions (Signed)
Local Driver Evaluation Programs: ° °Comprehensive Evaluation: includes clinical and in vehicle behind the wheel testing by OCCUPATIONAL THERAPIST. Programs have varying levels of adaptive controls available for trial.  ° °[facility] °[address] °[place], [place]  [address] °[phone] or [phone] °http://www.driver-rehab.com °Evaluator:  Cyndee Crompton, OT/CDRS/CDI/SCDCM/Low Vision Certification ° °[facility] °[address] °[place], [place] [address] °[phone] °https://www.[facility].org/home/services/rehabilitation.aspx °Evaluators:  Shannon Sheek, OT and Jill Tucker, OT ° °[facility] [place] [facility] (ONLY SERVES VETERANS!!) °[facility] °[address] °[place], [place]  [address] °[phone] °http://www.[place].va.gov/services/Physical_Medicine_Rehabilitation_Services.asp °Evaluators:  Eric Andrews, KT; Heidi Harris, KT;  Gary Whitaker, KT (KT=kiniesotherapist) ° ° °Clinical evaluations only:  Includes clinical testing, refers to other programs or local certified driving instructor for behind the wheel testing. ° °[facility] (outpatient Rehab) °[facility] °[address] °[place], [place] [address] °[phone] for scheduling °http://www.[facility].edu/Outpatient-Rehabilitation/Neurorehabilitation-Therapy.htm °Evaluators:  Kelly Lambeth, OT; Kate Phillips, OT ° °Other area clinical evaluators available upon request including [facility], [facility] and [facility]. ° ° °    Resource List °What is a Driver Evaluation: °Your Road Ahead - A Guide to Comprehensive Driving Evaluations °http://www.thehartford.com/resources/mature-market-excellence/publications-on-aging ° °Association for [employer] Services - Disability and Driving Fact Sheets °http://www.aded.net/?page=510 ° °Driving after a Brain  Injury: °[employer] °http://www.biausa.org/tbims-abstracts/if-there-is-an-effective-way-to-determine-if-someone-is-ready-to-drive-after-tbi?A=SearchResult&SearchID=9495675&ObjectID=2758842&ObjectType=35 ° °Driving with Adaptive Equipment: °[facility] Process °http://www.driver-rehab.com/adaptive-equipment ° °[employer] °http://www.nmeda.com/ ° ° ° ° ° ° °  °

## 2020-01-31 NOTE — Therapy (Signed)
[facility] [address] [place], [place], [address] Phone: [phone]   Fax:  [phone]  Occupational Therapy Evaluation  Patient Details  Name: [name] MRN: [id number] Date of Birth: Sep 27, 1955 Referring Provider (OT): Dr. Jamse Arn   Encounter Date: 01/31/2020  OT End of Session - 01/31/20 1518    Visit Number  1   eval only   Authorization Type  [employer]--30 visit limit combined (same day counts as 1 visit)    OT Start Time  1110    OT Stop Time  1150    OT Time Calculation (min)  40 min    Activity Tolerance  Patient tolerated treatment well    Behavior During Therapy  [facility] for tasks assessed/performed       Past Medical History:  Diagnosis Date  . Anemia   . Breast cancer ([place]) 12/29/13   right 6:00 o'clock, lower outer  . Cataract of left eye   . Diabetes mellitus    type 2  . Family history of malignant neoplasm of breast   . Glaucoma    MD just watching, no eye drops  . High cholesterol   . History of blood transfusion 2015   with chemo treatments  . History of radiation therapy 08/12/14- 10/11/14   right breast /50.4 Gy/28 fx, right breast boost/ 10 Gy/ 5 fx  . HTN (hypertension)   . Personal history of chemotherapy   . Personal history of radiation therapy   . Pituitary adenoma ([place]) 10/30/2018  . Wears dentures    full top-partial bottom  . Wears dentures    full  . Wears glasses   . Wears glasses     Past Surgical History:  Procedure Laterality Date  . ABDOMINAL HYSTERECTOMY  1993   endometriosis  . BREAST BIOPSY Left 12/29/2013  . BREAST LUMPECTOMY Right 02/08/2014  . BREAST LUMPECTOMY WITH AXILLARY LYMPH NODE BIOPSY Right 02/08/14   invasive ductal  . CRANIOTOMY N/A 04/15/2019   Procedure: Transphenoid resection of pituitary tumor with fat graft;  Surgeon: Judith Part, MD;  Location: [place];  Service: Neurosurgery;  Laterality: N/A;  . IR IMAGE GUIDED FLUID  DRAIN BY CATHETER  04/19/2019  . MULTIPLE TOOTH EXTRACTIONS    . PLACEMENT OF LUMBAR DRAIN N/A 04/24/2019   Procedure: PLACEMENT OF LUMBAR DRAIN;  Surgeon: Eustace Moore, MD;  Location: [facility];  Service: Neurosurgery;  Laterality: N/A;  . PLACEMENT OF LUMBAR DRAIN N/A 05/16/2019   Procedure: PLACEMENT OF LUMBAR DRAIN;  Surgeon: Newman Pies, MD;  Location: [place];  Service: Neurosurgery;  Laterality: N/A;  PLACEMENT OF LUMBAR DRAIN  . PORT-A-CATH REMOVAL    . PORTACATH PLACEMENT Left 02/08/2014   Procedure: INSERTION PORT-A-CATH;  Surgeon: Rolm Bookbinder, MD;  Location: [place];  Service: General;  Laterality: Left;  . REPAIR OF CEREBROSPINAL FLUID LEAK N/A 04/29/2019   Procedure: Endoscopic Endonasal Cerebrospinal Fluid Leak repair, Abdominal wound revision and fat graft harvest, Intrathecal Fluorocein Injection;  Surgeon: Judith Part, MD;  Location: [facility];  Service: Neurosurgery;  Laterality: N/A;  . TONSILLECTOMY    . TRANSNASAL APPROACH N/A 04/15/2019   Procedure: TRANSNASAL APPROACH;  Surgeon: Jerrell Belfast, MD;  Location: [facility];  Service: ENT;  Laterality: N/A;  . TRANSPHENOIDAL APPROACH EXPOSURE N/A 04/15/2019   Procedure: ENDOSCOPIC TRANS NASAL APPROACH WITH FUSIONN;  Surgeon: Jerrell Belfast, MD;  Location: [place];  Service: ENT;  Laterality: N/A;  . TRANSPHENOIDAL APPROACH EXPOSURE  N/A 04/29/2019   Procedure: Endoscopic repair of spinal fluid leak ;  Surgeon: Jerrell Belfast, MD;  Location: [facility];  Service: ENT;  Laterality: N/A;  . TRANSPHENOIDAL PITUITARY RESECTION  2006  . VENTRICULOPERITONEAL SHUNT Right 05/31/2019   Procedure: SHUNT INSERTION VENTRICULAR-PERITONEAL; REMOVAL OF LUMBAR DRAIN;  Surgeon: Judith Part, MD;  Location: [facility];  Service: Neurosurgery;  Laterality: Right;  SHUNT INSERTION VENTRICULAR-PERITONEAL; REMOVAL OF LUMBAR DRAIN  . WISDOM TOOTH EXTRACTION      There were no vitals filed for this visit.  Subjective Assessment -  01/31/20 1115    Patient Stated Goals  improve walking, not get tired so easy with walking    Currently in Pain?  Yes    Pain Score  4     Pain Location  Knee    Pain Orientation  Right;Left    Pain Descriptors / Indicators  Aching    Pain Type  Chronic pain    Pain Onset  More than a month ago    Pain Frequency  Intermittent    Aggravating Factors   first getting up, left worse than right    Pain Relieving Factors  rest        [facility] OT Assessment - 01/31/20 1115      Assessment   Medical Diagnosis  cerebral ventriculitis    Referring Provider (OT)  Dr. Jamse Arn    Onset Date/Surgical Date  11/03/19   referral date   Hand Dominance  Right    Prior Therapy  home health PT and OT      Precautions   Precautions  None      Balance Screen   Has the patient fallen in the past 6 months  No      Home  Environment   Family/patient expects to be discharged to:  Private residence    Lives With  --   alone prior (stays with sister/dad and dtr supervises)     Prior Function   Level of Independence  Independent    Vocation  Full time employment    Database administrator, currently in process for disability    Leisure  traveling      ADL   ADL comments  Pt reports BADLs mod I      IADL   Teaching laboratory technician for transportation    Prior Level of Function Light Housekeeping  independent    Nurse, adult  --   performs light tasks, family assist for heavier   Prior Level of Function Meal Prep  independent    Meal Prep  Plans, prepares and serves adequate meals independently    Prior Level of Function Sales executive  Relies on family or friends for transportation   has not been cleared for driving    Medication Management  Is responsible for taking medication in correct dosages at correct time    Financial Management  --   sister assisting (does together)     Mobility   Mobility Status  Independent   uses  cane with fatigue   Mobility Status Comments  see PT eval for details      Vision - History   Baseline Vision  Wears glasses all the time   progressive lenses   Additional Comments  recently went to eye doctor and will need updated prescription (has not yet ordered new glasses)      Vision Assessment   Eye Alignment  Impaired (comment)    Visual Fields  Left visual field deficit   mild peripheral, but blurriness L eye L of midline   Comment  Pt reports blurriness L eye (L of midline with R eye occluded).  Pt performed environmental scanning with 9/15 items found on first pass additional 4 on 2nd pass, and min cueing for remaining 2.  Items missed on both sides (due to ?attention)      Cognition   Overall Cognitive Status  Impaired/Different from baseline    Area of Impairment  Memory;Attention    Current Attention Level  Selective;Alternating   decr per pt and with decr visual scanning   Memory  Decreased short-term memory   just delayed   Cognition Comments  pt reports incr processing time, word recall      Sensation   Additional Comments  Does report some tingling in toes that has been there for years after chemo      ROM / Strength   AROM / PROM / Strength  AROM;Strength      AROM   Overall AROM   Within functional limits for tasks performed   BUEs      Strength   Right Shoulder Flexion  4+/5   pt reports due to arthritis/no pain   Right Shoulder ABduction  5/5    Left Shoulder Flexion  5/5    Left Shoulder ABduction  5/5    Right Elbow Flexion  5/5    Right Elbow Extension  5/5    Left Elbow Flexion  5/5    Left Elbow Extension  5/5      Hand Function   Right Hand Grip (lbs)  71.4    Left Hand Grip (lbs)  55.9                      OT Education - 01/31/20 1512    Education Details  Discussed OT eval results.  Discussed concerns about environmental scanning (?due to attention) and OT discussed with PT so that it can be incorporated with ambulation.   Recommended obtaining updated glasses, returning to living alone, prior to return to driving (once cleared medically).  Also discussed driving evaluation as option/discuss with MD and information issued.  Recommended pt practice graduated driving once cleared medically (start in parking lot, familiar residential road, with directions--all with supervision).  Also recommended activities that help with cognitive skills including board/card games, word searches, etc.    Person(s) Educated  Patient    Methods  Explanation    Comprehension  Verbalized understanding                 Plan - 01/31/20 1520    Clinical Impression Statement  Pt is a 64 y.o. female referred to Occupational thearpy for cerebral ventriculitis.  PMH includes: DM, pituitary tumor s/p resection 12/06 and 2020, baterial meningitis, VP shunt placement, hydrocephalus, CSF leak.  Pt lived alone previously but is now staying with father/sister some and dtr stays with her on weekends.  Pt reports that she is doing well with BADLs and IADLs such as cooking/cleaning and medication management, but is not yet cleared to return to driving or living alone.  Sister assists with financial management.  Pt presents today with mild visual deficits and cognitive deficits.  However, pt reports goal is to incr endurance with walking/balance.  Pt does demo decr environmental scanning which may be due to decr attention (L visual field deficit present, but pt missed items  on both sides).  As, cognitive/visual deficits do not appear to be impacting function of ADLs/IADLs other than possibly driving, recommended possible driving evaluation vs. graded driving with supervision once pt cleared by physician.  Pt verbalized understanding/agreement with plan.    OT Occupational Profile and History  Detailed Assessment- Review of Records and additional review of physical, cognitive, psychosocial history related to current functional performance    Occupational  performance deficits (Please refer to evaluation for details):  IADL's;Leisure    Body Structure / Function / Physical Skills  Vision    Cognitive Skills  Attention    Clinical Decision Making  Limited treatment options, no task modification necessary    Comorbidities Affecting Occupational Performance:  May have comorbidities impacting occupational performance    Modification or Assistance to Complete Evaluation   No modification of tasks or assist necessary to complete eval    OT Frequency  1x / week   eval only   OT Treatment/Interventions  Self-care/ADL training;Cognitive remediation/compensation;Visual/perceptual remediation/compensation;Patient/family education    Plan  no further ongoing occupational therapy at this time; pt to discuss driving further with MD (driving evaluation may be beneficial)    Consulted and Agree with Plan of Care  Patient       Patient will benefit from skilled therapeutic intervention in order to improve the following deficits and impairments:   Body Structure / Function / Physical Skills: Vision Cognitive Skills: Attention     Visit Diagnosis: Visuospatial deficit - Plan: Ot plan of care cert/re-cert  Attention and concentration deficit - Plan: Ot plan of care cert/re-cert    Problem List Patient Active Problem List   Diagnosis Date Noted  . Visual disturbance 11/03/2019  . Abnormality of gait 11/03/2019  . Anemia of chronic disease   . CKD (chronic kidney disease), stage II   . Controlled type 2 diabetes mellitus with hyperglycemia, without long-term current use of insulin ([place])   . Benign essential HTN   . 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 S/P VP shunt   . Diabetes mellitus type 2 in obese ([place])   . Acute blood loss anemia   . Elevated BUN 05/12/2019  . Elevated serum creatinine 05/12/2019  . Pituitary tumor 05/12/2019  . Vitamin D deficiency 05/12/2019  . DJD (degenerative joint disease)  05/12/2019  . 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 Febrile illness   . AKI (acute kidney injury) ([place]) 10/30/2018  . Pituitary adenoma ([place]) 10/30/2018  . Acute bacterial meningitis 10/30/2018  . Sepsis ([place]) 10/30/2018  . Headache 10/29/2018  . Acute encephalopathy 10/29/2018  . Rhabdomyolysis 10/29/2018  . History of pituitary adenoma 10/29/2018  . Glaucoma 10/29/2018  . Atherosclerosis 10/29/2018  . Acute purulent meningitis 10/29/2018  . CKD (chronic kidney disease), stage III 10/29/2018  . Meningitis 10/29/2018  . Fever 10/29/2018  . Malignant neoplasm of lower-outer quadrant of right breast of female, estrogen receptor negative ([place]) 06/09/2016  . Morbid obesity with BMI of 40.0-44.9, adult ([place]) 05/25/2015  . Type II diabetes mellitus, uncontrolled ([place]) 05/25/2015  . Normocytic anemia 04/27/2014  . Hyperlipidemia, unspecified 10/22/2011  . Essential hypertension with goal blood pressure less than 130/85 10/22/2011  . DM (diabetes mellitus) ([place]) 10/22/2011  . HTN (hypertension)   . Diabetes mellitus type II, non insulin dependent ([place])   . Dyslipidemia     [facility] 01/31/2020, 3:37 PM  [facility] [address] [place], [place], [address]  Phone: [phone]   Fax:  [phone]  Name: Felisitas Whitner MRN: [id number] Date of Birth: [birth year]   Vianne Bulls, OTR/L [facility] [address] [place],   [address] [phone] phone [phone] 01/31/20 3:37 PM

## 2020-01-31 NOTE — Therapy (Signed)
[facility] [address] [place], [place], [address] Phone: [phone]   Fax:  [phone]  Physical Therapy Evaluation  Patient Details  Name: [name] MRN: [id number] Date of Birth: [birth year] Referring Provider (PT): Delice Lesch   Encounter Date: 01/31/2020  PT End of Session - 01/31/20 1019    Visit Number  1    Number of Visits  17    Date for PT Re-Evaluation  03/31/20    PT Start Time  [id number]    PT Stop Time  1058    PT Time Calculation (min)  43 min    Activity Tolerance  Patient tolerated treatment well    Behavior During Therapy  [facility] for tasks assessed/performed       Past Medical History:  Diagnosis Date  . Anemia   . Breast cancer ([place]) 12/29/13   right 6:00 o'clock, lower outer  . Cataract of left eye   . Diabetes mellitus    type 2  . Family history of malignant neoplasm of breast   . Glaucoma    MD just watching, no eye drops  . High cholesterol   . History of blood transfusion 2015   with chemo treatments  . History of radiation therapy 08/12/14- 10/11/14   right breast /50.4 Gy/28 fx, right breast boost/ 10 Gy/ 5 fx  . HTN (hypertension)   . Personal history of chemotherapy   . Personal history of radiation therapy   . Pituitary adenoma ([place]) 10/30/2018  . Wears dentures    full top-partial bottom  . Wears dentures    full  . Wears glasses   . Wears glasses     Past Surgical History:  Procedure Laterality Date  . ABDOMINAL HYSTERECTOMY  1993   endometriosis  . BREAST BIOPSY Left 12/29/2013  . BREAST LUMPECTOMY Right 02/08/2014  . BREAST LUMPECTOMY WITH AXILLARY LYMPH NODE BIOPSY Right 02/08/14   invasive ductal  . CRANIOTOMY N/A 04/15/2019   Procedure: Transphenoid resection of pituitary tumor with fat graft;  Surgeon: Judith Part, MD;  Location: [place];  Service: Neurosurgery;  Laterality: N/A;  . IR IMAGE GUIDED FLUID DRAIN BY CATHETER  04/19/2019  . MULTIPLE  TOOTH EXTRACTIONS    . PLACEMENT OF LUMBAR DRAIN N/A 04/24/2019   Procedure: PLACEMENT OF LUMBAR DRAIN;  Surgeon: Eustace Moore, MD;  Location: [place];  Service: Neurosurgery;  Laterality: N/A;  . PLACEMENT OF LUMBAR DRAIN N/A 05/16/2019   Procedure: PLACEMENT OF LUMBAR DRAIN;  Surgeon: Newman Pies, MD;  Location: [facility];  Service: Neurosurgery;  Laterality: N/A;  PLACEMENT OF LUMBAR DRAIN  . PORT-A-CATH REMOVAL    . PORTACATH PLACEMENT Left 02/08/2014   Procedure: INSERTION PORT-A-CATH;  Surgeon: Rolm Bookbinder, MD;  Location: [facility];  Service: General;  Laterality: Left;  . REPAIR OF CEREBROSPINAL FLUID LEAK N/A 04/29/2019   Procedure: Endoscopic Endonasal Cerebrospinal Fluid Leak repair, Abdominal wound revision and fat graft harvest, Intrathecal Fluorocein Injection;  Surgeon: Judith Part, MD;  Location: [facility] [place];  Service: Neurosurgery;  Laterality: N/A;  . TONSILLECTOMY    . TRANSNASAL APPROACH N/A 04/15/2019   Procedure: TRANSNASAL APPROACH;  Surgeon: Jerrell Belfast, MD;  Location: [facility] [place] [facility];  Service: ENT;  Laterality: N/A;  . TRANSPHENOIDAL APPROACH EXPOSURE N/A 04/15/2019   Procedure: ENDOSCOPIC TRANS NASAL APPROACH WITH FUSIONN;  Surgeon: Jerrell Belfast, MD;  Location: [facility];  Service: ENT;  Laterality: N/A;  . TRANSPHENOIDAL APPROACH EXPOSURE N/A 04/29/2019  Procedure: Endoscopic repair of spinal fluid leak ;  Surgeon: Jerrell Belfast, MD;  Location: [facility];  Service: ENT;  Laterality: N/A;  . TRANSPHENOIDAL PITUITARY RESECTION  2006  . VENTRICULOPERITONEAL SHUNT Right 05/31/2019   Procedure: SHUNT INSERTION VENTRICULAR-PERITONEAL; REMOVAL OF LUMBAR DRAIN;  Surgeon: Judith Part, MD;  Location: [facility];  Service: Neurosurgery;  Laterality: Right;  SHUNT INSERTION VENTRICULAR-PERITONEAL; REMOVAL OF LUMBAR DRAIN  . WISDOM TOOTH EXTRACTION      There were no vitals filed for this visit.   Subjective Assessment - 01/31/20 1021    Subjective  Pt reports that  10/2018 she had some weakness from meningitis. It was discovered that her tumor had returned. 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 Left visual field deficits. Pt reports that she was out of it for about 2 months after surgery. Pt reports that she is having trouble with walking long distances but has been improving. She just finished with home health therapy. Pt reports she is doing pretty good with compensating for loss of vision. She also has new glasses and recently saw eye doctor. Pt reports that she is trying to not use her SPC as much and left in car. Pt reports she was independent prior to all this. Currently staying with her dad and sister.    Pertinent History  64 y.o. 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    Currently in Pain?  Yes    Pain Score  4     Pain Location  Knee    Pain Orientation  Right;Left    Pain Descriptors / Indicators  Aching    Pain Type  Chronic pain    Pain Onset  More than a month ago    Pain Frequency  Intermittent    Aggravating Factors   first getting up, left worse than right         [facility] PT Assessment - 01/31/20 1029      Assessment   Medical Diagnosis  cerebral ventriculitis    Referring Provider (PT)  Ankit Patel    Hand Dominance  Right    Prior Therapy  home health PT and OT      Precautions   Precautions  None      Balance Screen   Has the patient fallen in the past 6 months  No    Has the patient had a decrease in activity level because of a fear of falling?   No    Is the patient reluctant to leave their home because of a fear of falling?   No      Home Film/video editor residence     Freight forwarder;Children    Available Help at Discharge  Family    Type of [place] Access  Level entry    [place] - single point;Bedside commode;Tub bench    Additional Comments  Pt is going back and forth between her dad's house and her house. At dad's during the week when her daughter is home on weekend she goes back to her house.      Prior  Function   Level of Independence  Independent    Vocation  Full time employment    Database administrator, currently in process for disability    Leisure  traveling      Cognition   Overall Cognitive Status  Impaired/Different from baseline    Area of Impairment  --   word finding, recall     Observation/Other Assessments   Observations  peripheral vision intact, smooth pursuit intact, saccades intact. Blurred vision left eye unable to tell how many fingers PT holding with right eye covered.      Sensation   Light Touch  Appears Intact    Additional Comments  Does report some tingling in toes that has been there for years after chemo      ROM / Strength   AROM / PROM / Strength  Strength      Strength   Strength Assessment Site  Shoulder;Elbow;Hand;Hip;Knee;Ankle    Right/Left Shoulder  Right;Left    Right Shoulder Flexion  4/5    Left Shoulder Flexion  5/5    Right/Left Elbow  Right;Left    Right Elbow Flexion  5/5    Right Elbow Extension  5/5    Left Elbow Flexion  5/5    Left Elbow Extension  5/5    Right/Left hand  Right;Left    Right Hand Gross Grasp  Functional    Left Hand Gross Grasp  Functional    Right/Left Hip  Right;Left    Right Hip Flexion  4/5    Right Hip ABduction  4/5    Left Hip Flexion  4+/5    Left Hip ABduction  4/5    Right/Left Knee  Right;Left    Right Knee Flexion  4+/5    Right Knee Extension  5/5    Left Knee Flexion  4+/5    Left Knee Extension  5/5    Right/Left Ankle  Right;Left    Right Ankle  Dorsiflexion  4/5    Left Ankle Dorsiflexion  4/5      Bed Mobility   Bed Mobility  Rolling Right;Rolling Left;Supine to Sit;Sit to Supine    Rolling Right  Independent    Rolling Left  Independent    Supine to Sit  Independent    Sit to Supine  Independent      Transfers   Five time sit to stand comments   17.2 sec without UE support from chair      Ambulation/Gait   Ambulation/Gait  Yes    Ambulation/Gait Assistance  5: Supervision    Ambulation Distance (Feet)  100 Feet    Assistive device  None    Gait Pattern  Step-through pattern   trendelenberg left   Ambulation Surface  Level;Indoor    Gait velocity  15.69 sec=0.17m/s      Standardized Balance Assessment   Standardized Balance Assessment  Timed Up and Go Test      Timed Up and Go Test   TUG  Normal TUG    Normal TUG (seconds)  13.81                  Objective measurements completed on examination: See above findings.              PT Education - 01/31/20 1058    Education Details  Instructed in sit to stand 5 x 2 to perform daily. Also to gradually increase walking time. Discussed PT plan of care.    Person(s)  Educated  Patient    Methods  Explanation    Comprehension  Verbalized understanding       PT Short Term Goals - 01/31/20 1204      PT SHORT TERM GOAL #1   Title  Pt will be independent with initial HEP for strength, balance and walking program.    Time  4    Period  Weeks    Status  New    Target Date  03/01/20      PT SHORT TERM GOAL #2   Title  Pt will increase gait speed to >0.61m/s for improved gait safety.    Baseline  0.81m/s on 01/31/20    Time  4    Period  Weeks    Status  New    Target Date  03/01/20      PT SHORT TERM GOAL #3   Title  Pt wil decreased 5 x sit to stand from 17.2 sec to <15 sec for improved balance and functional strength.    Baseline  17.2 sec on 01/31/20    Time  4    Period  Weeks    Status  New    Target Date  03/01/20      PT SHORT TERM  GOAL #4   Title  FGA and 6 min walk will be assessed and LTGs written to further assess balance and gait.    Time  4    Period  Weeks    Status  New    Target Date  03/01/20        PT Long Term Goals - 01/31/20 1257      PT LONG TERM GOAL #1   Title  Pt will be independent with progressive HEP for strength, balance and gait to continue gains on own.    Time  8    Period  Weeks    Status  New    Target Date  03/31/20      PT LONG TERM GOAL #2   Title  Pt will ambulate > 800' on varied surfaces independently with scanning environment for improved gait safety in community.    Time  8    Period  Weeks    Status  New    Target Date  03/31/20      PT LONG TERM GOAL #3   Title  FGA goal to TBD.    Time  8    Period  Weeks    Status  New    Target Date  03/31/20      PT LONG TERM GOAL #4   Title  6 min walk goal TBD    Time  8    Period  Weeks    Status  New    Target Date  03/31/20             Plan - 01/31/20 1132    Clinical Impression Statement  Pt is 64 y/o female with referral for cerebral ventriculitis, visual disturbance and abnormality of gait. Pt has left visual field deficits but reports she is doing much better compensating for this. She reports that her stamina is improving but still gets tired when walks longer distances. Pt has slight decreased hip strength. Was noted to have trendelenberg gait on left but also has chronic left knee pain. She is ambulating at slower gait speed of 0.63m/s which is safe for household ambulator but not community ambulator. Pt is fall risk based on 5 x sit to stand of  17.2 sec. TUG score showed lower fall risk with score of 13.81 sec. Pt will benefit from skilled PT to work on balance, strength and functional mobility to maximize independence.    Personal Factors and Comorbidities  Comorbidity 3+    Comorbidities  T2DM, breast cancer, pituitary tumor s/p resection in 08/2005, bacterial meningitis, VP shunt placement due to  hydrocephalus, CSF leak, and ventriculitis    Examination-Activity Limitations  Locomotion Level    Examination-Participation Restrictions  Community Activity;Driving    Stability/Clinical Decision Making  Evolving/Moderate complexity    Clinical Decision Making  Moderate    Rehab Potential  Good    PT Frequency  2x / week    PT Duration  8 weeks    PT Treatment/Interventions  ADLs/Self Care Home Management;Cryotherapy;Moist Heat;Therapeutic activities;Therapeutic exercise;Balance training;Neuromuscular re-education;Manual techniques;Passive range of motion;Patient/family education;Vestibular    PT Next Visit Plan  Assess 6 min walk checking vitals for baseline, FGA. Initiate strengthening and balance HEP. Gait with scanning environment.    Consulted and Agree with Plan of Care  Patient       Patient will benefit from skilled therapeutic intervention in order to improve the following deficits and impairments:  Abnormal gait, Decreased activity tolerance, Decreased balance, Impaired vision/preception, Decreased strength  Visit Diagnosis: Other abnormalities of gait and mobility  Muscle weakness (generalized)     Problem List Patient Active Problem List   Diagnosis Date Noted  . Visual disturbance 11/03/2019  . Abnormality of gait 11/03/2019  . Anemia of chronic disease   . CKD (chronic kidney disease), stage II   . Controlled type 2 diabetes mellitus with hyperglycemia, without long-term current use of insulin ([place])   . Benign essential HTN   . 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 S/P VP shunt   . Diabetes mellitus type 2 in obese ([place])   . Acute blood loss anemia   . Elevated BUN 05/12/2019  . Elevated serum creatinine 05/12/2019  . Pituitary tumor 05/12/2019  . Vitamin D deficiency 05/12/2019  . DJD (degenerative joint disease) 05/12/2019  . 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 Febrile illness   . AKI (acute  kidney injury) ([place]) 10/30/2018  . Pituitary adenoma ([place]) 10/30/2018  . Acute bacterial meningitis 10/30/2018  . Sepsis ([place]) 10/30/2018  . Headache 10/29/2018  . Acute encephalopathy 10/29/2018  . Rhabdomyolysis 10/29/2018  . History of pituitary adenoma 10/29/2018  . Glaucoma 10/29/2018  . Atherosclerosis 10/29/2018  . Acute purulent meningitis 10/29/2018  . CKD (chronic kidney disease), stage III 10/29/2018  . Meningitis 10/29/2018  . Fever 10/29/2018  . Malignant neoplasm of lower-outer quadrant of right breast of female, estrogen receptor negative ([place]) 06/09/2016  . Morbid obesity with BMI of 40.0-44.9, adult ([place]) 05/25/2015  . Type II diabetes mellitus, uncontrolled ([place]) 05/25/2015  . Normocytic anemia 04/27/2014  . Hyperlipidemia, unspecified 10/22/2011  . Essential hypertension with goal blood pressure less than 130/85 10/22/2011  . DM (diabetes mellitus) ([place]) 10/22/2011  . HTN (hypertension)   . Diabetes mellitus type II, non insulin dependent ([place])   . Dyslipidemia     Electa Sniff, PT, DPT, NCS 01/31/2020, 12:59 PM  [facility] [address] [place], [place], [address] Phone: [phone]   Fax:  [phone]  Name: [name] MRN: [id number] Date of Birth: [birth year]

## 2020-02-02 ENCOUNTER — Other Ambulatory Visit: Payer: Self-pay

## 2020-02-02 ENCOUNTER — Ambulatory Visit: Payer: BC Managed Care – PPO

## 2020-02-02 DIAGNOSIS — R41842 Visuospatial deficit: Secondary | ICD-10-CM | POA: Diagnosis not present

## 2020-02-02 DIAGNOSIS — R2689 Other abnormalities of gait and mobility: Secondary | ICD-10-CM

## 2020-02-02 DIAGNOSIS — M6281 Muscle weakness (generalized): Secondary | ICD-10-CM

## 2020-02-02 NOTE — Therapy (Signed)
[facility] [address] [place], [place], [address] Phone: [phone]   Fax:  [phone]  Physical Therapy Treatment  Patient Details  Name: [name] MRN: [id number] Date of Birth: [birth year] Referring Provider (PT): Delice Lesch   Encounter Date: 02/02/2020  PT End of Session - 02/02/20 1021    Visit Number  2    Number of Visits  17    Date for PT Re-Evaluation  03/31/20    PT Start Time  1020    PT Stop Time  1058    PT Time Calculation (min)  38 min    Activity Tolerance  Patient tolerated treatment well    Behavior During Therapy  [facility] for tasks assessed/performed       Past Medical History:  Diagnosis Date  . Anemia   . Breast cancer ([place]) 12/29/13   right 6:00 o'clock, lower outer  . Cataract of left eye   . Diabetes mellitus    type 2  . Family history of malignant neoplasm of breast   . Glaucoma    MD just watching, no eye drops  . High cholesterol   . History of blood transfusion 2015   with chemo treatments  . History of radiation therapy 08/12/14- 10/11/14   right breast /50.4 Gy/28 fx, right breast boost/ 10 Gy/ 5 fx  . HTN (hypertension)   . Personal history of chemotherapy   . Personal history of radiation therapy   . Pituitary adenoma ([place]) 10/30/2018  . Wears dentures    full top-partial bottom  . Wears dentures    full  . Wears glasses   . Wears glasses     Past Surgical History:  Procedure Laterality Date  . ABDOMINAL HYSTERECTOMY  1993   endometriosis  . BREAST BIOPSY Left 12/29/2013  . BREAST LUMPECTOMY Right 02/08/2014  . BREAST LUMPECTOMY WITH AXILLARY LYMPH NODE BIOPSY Right 02/08/14   invasive ductal  . CRANIOTOMY N/A 04/15/2019   Procedure: Transphenoid resection of pituitary tumor with fat graft;  Surgeon: Judith Part, MD;  Location: [facility];  Service: Neurosurgery;  Laterality: N/A;  . IR IMAGE GUIDED FLUID DRAIN BY CATHETER  04/19/2019  . MULTIPLE TOOTH  EXTRACTIONS    . PLACEMENT OF LUMBAR DRAIN N/A 04/24/2019   Procedure: PLACEMENT OF LUMBAR DRAIN;  Surgeon: Eustace Moore, MD;  Location: [facility];  Service: Neurosurgery;  Laterality: N/A;  . PLACEMENT OF LUMBAR DRAIN N/A 05/16/2019   Procedure: PLACEMENT OF LUMBAR DRAIN;  Surgeon: Newman Pies, MD;  Location: [facility];  Service: Neurosurgery;  Laterality: N/A;  PLACEMENT OF LUMBAR DRAIN  . PORT-A-CATH REMOVAL    . PORTACATH PLACEMENT Left 02/08/2014   Procedure: INSERTION PORT-A-CATH;  Surgeon: Rolm Bookbinder, MD;  Location: [facility];  Service: General;  Laterality: Left;  . REPAIR OF CEREBROSPINAL FLUID LEAK N/A 04/29/2019   Procedure: Endoscopic Endonasal Cerebrospinal Fluid Leak repair, Abdominal wound revision and fat graft harvest, Intrathecal Fluorocein Injection;  Surgeon: Judith Part, MD;  Location: [facility];  Service: Neurosurgery;  Laterality: N/A;  . TONSILLECTOMY    . TRANSNASAL APPROACH N/A 04/15/2019   Procedure: TRANSNASAL APPROACH;  Surgeon: Jerrell Belfast, MD;  Location: [facility];  Service: ENT;  Laterality: N/A;  . TRANSPHENOIDAL APPROACH EXPOSURE N/A 04/15/2019   Procedure: ENDOSCOPIC TRANS NASAL APPROACH WITH FUSIONN;  Surgeon: Jerrell Belfast, MD;  Location: [facility];  Service: ENT;  Laterality: N/A;  . TRANSPHENOIDAL APPROACH EXPOSURE N/A 04/29/2019  Procedure: Endoscopic repair of spinal fluid leak ;  Surgeon: Jerrell Belfast, MD;  Location: [facility];  Service: ENT;  Laterality: N/A;  . TRANSPHENOIDAL PITUITARY RESECTION  2006  . VENTRICULOPERITONEAL SHUNT Right 05/31/2019   Procedure: SHUNT INSERTION VENTRICULAR-PERITONEAL; REMOVAL OF LUMBAR DRAIN;  Surgeon: Judith Part, MD;  Location: [facility];  Service: Neurosurgery;  Laterality: Right;  SHUNT INSERTION VENTRICULAR-PERITONEAL; REMOVAL OF LUMBAR DRAIN  . WISDOM TOOTH EXTRACTION      There were no vitals filed for this visit.  Subjective Assessment - 02/02/20 1021    Subjective  Pt reports that she is  doing ok. Still trying to get used to her current state.    Pertinent History  64 y.o. 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    Currently in Pain?  No/denies    Pain Onset  More than a month ago         [facility] PT Assessment - 02/02/20 1022      6 Minute Walk- Baseline   6 Minute Walk- Baseline  yes    BP (mmHg)  138/88    HR (bpm)  78    02 Sat (%RA)  99 %    Modified Borg Scale for Dyspnea  0- Nothing at all      6 Minute walk- Post Test   6 Minute Walk Post Test  yes    BP (mmHg)  148/86    HR (bpm)  104    02 Sat (%RA)  100 %    Modified Borg Scale for Dyspnea  7- Severe shortness of breath or very hard breathing      6 minute walk test results    Aerobic Endurance Distance Walked  805    Endurance additional comments  Pt took 1 brief standing rest break after 5 min.      Functional Gait  Assessment   Gait assessed   Yes    Gait Level Surface  Walks 20 ft, slow speed, abnormal gait pattern, evidence for imbalance or deviates 10-15 in outside of the 12 in walkway width. Requires more than 7 sec to ambulate 20 ft.    Change in Gait Speed  Able to change speed, demonstrates mild gait deviations, deviates 6-10 in outside of the 12 in walkway width, or no gait deviations, unable to achieve a major change in velocity, or uses a change in velocity, or uses an assistive device.    Gait with Horizontal Head Turns  Performs head turns smoothly with slight change in gait velocity (eg, minor disruption to smooth gait path), deviates 6-10 in outside 12 in walkway width, or uses an assistive device.    Gait with Vertical Head Turns  Performs head turns with no change in gait. Deviates no more than 6 in outside 12 in walkway width.    Gait and Pivot Turn  Pivot turns safely within 3 sec and stops quickly with no  loss of balance.    Step Over Obstacle  Is able to step over 2 stacked shoe boxes taped together (9 in total height) without changing gait speed. No evidence of imbalance.    Gait with Narrow Base of Support  Ambulates less than 4 steps heel to toe or cannot perform without assistance.    Gait with Eyes Closed  Walks 20 ft, uses  assistive device, slower speed, mild gait deviations, deviates 6-10 in outside 12 in walkway width. Ambulates 20 ft in less than 9 sec but greater than 7 sec.    Ambulating Backwards  Walks 20 ft, uses assistive device, slower speed, mild gait deviations, deviates 6-10 in outside 12 in walkway width.    Steps  Two feet to a stair, must use rail.    Total Score  19                    [facility] Adult PT Treatment/Exercise - 02/02/20 1022      Ambulation/Gait   Ambulation/Gait  Yes    Ambulation/Gait Assistance  5: Supervision    Ambulation/Gait Assistance Details  Pt had a couple episodes of decreased left foot clearance. Pt has trendelenberg left during stance with increased lateral sway.    Ambulation Distance (Feet)  805 Feet   during 6 min walk   Assistive device  None    Gait Pattern  Step-through pattern;Trendelenburg    Ambulation Surface  Level;Indoor      Exercises   Exercises  Other Exercises    Other Exercises   Standing at counter: heel/toe raises x 10, marching in place with 1 UE support x 10, hip abd x 10, hip extension x 10. Pt was given verbal cues for form with activities. Did not some increased varus at left knee during stance.             PT Education - 02/02/20 1125    Education Details  Pt was given initial strengthening HEP. Advised to start walking program starting with 4 min walk in home 2x/day and gradually increasing by 1 min every couple weeks.    Person(s) Educated  Patient    Methods  Explanation;Demonstration;Handout    Comprehension  Verbalized understanding;Returned demonstration       PT Short Term Goals -  02/02/20 1126      PT SHORT TERM GOAL #1   Title  Pt will be independent with initial HEP for strength, balance and walking program.    Time  4    Period  Weeks    Status  New    Target Date  03/01/20      PT SHORT TERM GOAL #2   Title  Pt will increase gait speed to >0.32m/s for improved gait safety.    Baseline  0.56m/s on 01/31/20    Time  4    Period  Weeks    Status  New    Target Date  03/01/20      PT SHORT TERM GOAL #3   Title  Pt wil decreased 5 x sit to stand from 17.2 sec to <15 sec for improved balance and functional strength.    Baseline  17.2 sec on 01/31/20    Time  4    Period  Weeks    Status  New    Target Date  03/01/20      PT SHORT TERM GOAL #4   Title  FGA and 6 min walk will be assessed and LTGs written to further assess balance and gait.    Time  4    Period  Weeks    Status  Achieved    Target Date  03/01/20        PT Long Term Goals - 02/02/20 1126      PT LONG TERM GOAL #1   Title  Pt will be independent with progressive HEP for strength,  balance and gait to continue gains on own.    Time  8    Period  Weeks    Status  New      PT LONG TERM GOAL #2   Title  Pt will ambulate > 800' on varied surfaces independently with scanning environment for improved gait safety in community.    Time  8    Period  Weeks    Status  New      PT LONG TERM GOAL #3   Title  Pt will increase FGA from 19/30 to 24 or more for improved balance and gait safety.    Time  8    Period  Weeks    Status  New      PT LONG TERM GOAL #4   Title  Pt will increase 6 min walk to >950' without rest break for improved activity tolerance and community mobilty.    Baseline  805' with 1 standing rest break.    Time  8    Period  Weeks    Status  New            Plan - 02/02/20 1128    Clinical Impression Statement  PT assessed FGA and 6 min walk today. FGA score of 19/30 indicated moderate fall risk. 6 min walk of 805' was decreased from norms and patient needed 1  brief standing rest break during. Pt will benefit from skilled PT to continue to progres strength, balance and functional mobilty deficits.    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, CSF leak, and ventriculitis    Examination-Activity Limitations  Locomotion Level    Examination-Participation Restrictions  Community Activity;Driving    Stability/Clinical Decision Making  Evolving/Moderate complexity    Rehab Potential  Good    PT Frequency  2x / week    PT Duration  8 weeks    PT Treatment/Interventions  ADLs/Self Care Home Management;Cryotherapy;Moist Heat;Therapeutic activities;Therapeutic exercise;Balance training;Neuromuscular re-education;Manual techniques;Passive range of motion;Patient/family education;Vestibular    PT Next Visit Plan  How is initial HEP going as well as walking program? Add to HEP with balance as able. Gait with scanning environment. Aerobic exercise.    Consulted and Agree with Plan of Care  Patient       Patient will benefit from skilled therapeutic intervention in order to improve the following deficits and impairments:  Abnormal gait, Decreased activity tolerance, Decreased balance, Impaired vision/preception, Decreased strength  Visit Diagnosis: Other abnormalities of gait and mobility  Muscle weakness (generalized)     Problem List Patient Active Problem List   Diagnosis Date Noted  . Visual disturbance 11/03/2019  . Abnormality of gait 11/03/2019  . Anemia of chronic disease   . CKD (chronic kidney disease), stage II   . Controlled type 2 diabetes mellitus with hyperglycemia, without long-term current use of insulin ([place])   . Benign essential HTN   . 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 S/P VP shunt   . Diabetes mellitus type 2 in obese ([place])   . Acute blood  loss anemia   . Elevated BUN 05/12/2019  . Elevated serum creatinine 05/12/2019  . Pituitary tumor 05/12/2019  . Vitamin D deficiency 05/12/2019  . DJD (degenerative joint disease) 05/12/2019  . 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 Febrile illness   .  AKI (acute kidney injury) ([place]) 10/30/2018  . Pituitary adenoma ([place]) 10/30/2018  . Acute bacterial meningitis 10/30/2018  . Sepsis ([place]) 10/30/2018  . Headache 10/29/2018  . Acute encephalopathy 10/29/2018  . Rhabdomyolysis 10/29/2018  . History of pituitary adenoma 10/29/2018  . Glaucoma 10/29/2018  . Atherosclerosis 10/29/2018  . Acute purulent meningitis 10/29/2018  . CKD (chronic kidney disease), stage III 10/29/2018  . Meningitis 10/29/2018  . Fever 10/29/2018  . Malignant neoplasm of lower-outer quadrant of right breast of female, estrogen receptor negative ([place]) 06/09/2016  . Morbid obesity with BMI of 40.0-44.9, adult ([place]) 05/25/2015  . Type II diabetes mellitus, uncontrolled ([place]) 05/25/2015  . Normocytic anemia 04/27/2014  . Hyperlipidemia, unspecified 10/22/2011  . Essential hypertension with goal blood pressure less than 130/85 10/22/2011  . DM (diabetes mellitus) ([place]) 10/22/2011  . HTN (hypertension)   . Diabetes mellitus type II, non insulin dependent ([place])   . Dyslipidemia     Electa Sniff, PT, DPT, NCS 02/02/2020, 11:31 AM  [facility] [address] [place], [place], [address] Phone: [phone]   Fax:  [phone]  Name: Sanaah Megill MRN: [id number] Date of Birth: June 17, 1956

## 2020-02-02 NOTE — Patient Instructions (Signed)
Access Code: [email or web address] URL: [email or web address] Date: 02/02/2020 Prepared by: Cherly Anderson  Exercises Heel Toe Raises with Counter Support - 2 x daily - 5 x weekly - 2 sets - 10 reps Standing Marching - 2 x daily - 5 x weekly - 2 sets - 10 reps Standing Hip Abduction with Counter Support - 2 x daily - 5 x weekly - 2 sets - 10 reps Standing Hip Extension with Counter Support - 2 x daily - 5 x weekly - 2 sets - 10 reps

## 2020-02-08 ENCOUNTER — Ambulatory Visit: Payer: BC Managed Care – PPO | Attending: Physical Medicine & Rehabilitation

## 2020-02-08 ENCOUNTER — Other Ambulatory Visit: Payer: Self-pay

## 2020-02-08 DIAGNOSIS — M6281 Muscle weakness (generalized): Secondary | ICD-10-CM | POA: Diagnosis present

## 2020-02-08 DIAGNOSIS — R2689 Other abnormalities of gait and mobility: Secondary | ICD-10-CM | POA: Diagnosis not present

## 2020-02-08 NOTE — Therapy (Signed)
[facility] [address] [place], [place], [address] Phone: [phone]   Fax:  [phone]  Physical Therapy Treatment  Patient Details  Name: [name] MRN: [id number] Date of Birth: 1955/12/08 Referring Provider (PT): Delice Lesch   Encounter Date: 02/08/2020  PT End of Session - 02/08/20 1022    Visit Number  3    Number of Visits  17    Date for PT Re-Evaluation  03/31/20    PT Start Time  1018    PT Stop Time  1059    PT Time Calculation (min)  41 min    Equipment Utilized During Treatment  Gait belt    Activity Tolerance  Patient tolerated treatment well    Behavior During Therapy  [facility] for tasks assessed/performed       Past Medical History:  Diagnosis Date  . Anemia   . Breast cancer ([place]) 12/29/13   right 6:00 o'clock, lower outer  . Cataract of left eye   . Diabetes mellitus    type 2  . Family history of malignant neoplasm of breast   . Glaucoma    MD just watching, no eye drops  . High cholesterol   . History of blood transfusion 2015   with chemo treatments  . History of radiation therapy 08/12/14- 10/11/14   right breast /50.4 Gy/28 fx, right breast boost/ 10 Gy/ 5 fx  . HTN (hypertension)   . Personal history of chemotherapy   . Personal history of radiation therapy   . Pituitary adenoma ([place]) 10/30/2018  . Wears dentures    full top-partial bottom  . Wears dentures    full  . Wears glasses   . Wears glasses     Past Surgical History:  Procedure Laterality Date  . ABDOMINAL HYSTERECTOMY  1993   endometriosis  . BREAST BIOPSY Left 12/29/2013  . BREAST LUMPECTOMY Right 02/08/2014  . BREAST LUMPECTOMY WITH AXILLARY LYMPH NODE BIOPSY Right 02/08/14   invasive ductal  . CRANIOTOMY N/A 04/15/2019   Procedure: Transphenoid resection of pituitary tumor with fat graft;  Surgeon: Judith Part, MD;  Location: [facility];  Service: Neurosurgery;  Laterality: N/A;  . IR IMAGE GUIDED  FLUID DRAIN BY CATHETER  04/19/2019  . MULTIPLE TOOTH EXTRACTIONS    . PLACEMENT OF LUMBAR DRAIN N/A 04/24/2019   Procedure: PLACEMENT OF LUMBAR DRAIN;  Surgeon: Eustace Moore, MD;  Location: [facility];  Service: Neurosurgery;  Laterality: N/A;  . PLACEMENT OF LUMBAR DRAIN N/A 05/16/2019   Procedure: PLACEMENT OF LUMBAR DRAIN;  Surgeon: Newman Pies, MD;  Location: [place] [facility];  Service: Neurosurgery;  Laterality: N/A;  PLACEMENT OF LUMBAR DRAIN  . PORT-A-CATH REMOVAL    . PORTACATH PLACEMENT Left 02/08/2014   Procedure: INSERTION PORT-A-CATH;  Surgeon: Rolm Bookbinder, MD;  Location: [facility];  Service: General;  Laterality: Left;  . REPAIR OF CEREBROSPINAL FLUID LEAK N/A 04/29/2019   Procedure: Endoscopic Endonasal Cerebrospinal Fluid Leak repair, Abdominal wound revision and fat graft harvest, Intrathecal Fluorocein Injection;  Surgeon: Judith Part, MD;  Location: [facility];  Service: Neurosurgery;  Laterality: N/A;  . TONSILLECTOMY    . TRANSNASAL APPROACH N/A 04/15/2019   Procedure: TRANSNASAL APPROACH;  Surgeon: Jerrell Belfast, MD;  Location: [facility];  Service: ENT;  Laterality: N/A;  . TRANSPHENOIDAL APPROACH EXPOSURE N/A 04/15/2019   Procedure: ENDOSCOPIC TRANS NASAL APPROACH WITH FUSIONN;  Surgeon: Jerrell Belfast, MD;  Location: [facility];  Service: ENT;  Laterality:  N/A;  . TRANSPHENOIDAL APPROACH EXPOSURE N/A 04/29/2019   Procedure: Endoscopic repair of spinal fluid leak ;  Surgeon: Jerrell Belfast, MD;  Location: [facility];  Service: ENT;  Laterality: N/A;  . TRANSPHENOIDAL PITUITARY RESECTION  2006  . VENTRICULOPERITONEAL SHUNT Right 05/31/2019   Procedure: SHUNT INSERTION VENTRICULAR-PERITONEAL; REMOVAL OF LUMBAR DRAIN;  Surgeon: Judith Part, MD;  Location: [facility];  Service: Neurosurgery;  Laterality: Right;  SHUNT INSERTION VENTRICULAR-PERITONEAL; REMOVAL OF LUMBAR DRAIN  . WISDOM TOOTH EXTRACTION      There were no vitals filed for this visit.  Subjective Assessment -  02/08/20 1021    Subjective  Pt reports that she is doing ok. Has not been able to do her exercises this weekend but has been pretty busy.    Pertinent History  64 y.o. 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    Currently in Pain?  No/denies    Pain Onset  More than a month ago                        [facility] Adult PT Treatment/Exercise - 02/08/20 1023      Ambulation/Gait   Ambulation/Gait  Yes    Ambulation/Gait Assistance  5: Supervision    Ambulation/Gait Assistance Details  Pt had a couple episodes of scuffing left foot. Does have increased lateral sway. Reports hips get tired as she goes on. HR=106 after gait. 3/10 pain left knee.    Ambulation Distance (Feet)  800 Feet    Assistive device  None    Gait Pattern  Step-through pattern;Trendelenburg   increased lateral sway   Ambulation Surface  Level;Unlevel;Outdoor;Paved      Neuro Re-ed    Neuro Re-ed Details   Standing on airex with feet together x 30 sec CGA with increased sway. Standing on airex with feet apart eyes closed x 30 sec, feet apart with head turns left/right and up/down x 10 each. Slight LOB anterior with vertical head turns. Standing on airex marching in place 10 x 2 with light 1 UE support then alternating toe taps on cone x 10 with 1 UE support. Standing in corner: feet together eyes closed x 30 sec then eyes open with head turns left/right and up/down x 10             PT Education - 02/08/20 1105    Education Details  Added corner balance exercises.    Person(s) Educated  Patient    Methods  Explanation       PT Short Term Goals - 02/02/20 1126      PT SHORT TERM GOAL #1   Title  Pt will be independent with initial HEP for strength, balance and walking program.    Time  4    Period  Weeks    Status  New     Target Date  03/01/20      PT SHORT TERM GOAL #2   Title  Pt will increase gait speed to >0.26m/s for improved gait safety.    Baseline  0.66m/s on 01/31/20    Time  4    Period  Weeks    Status  New    Target Date  03/01/20      PT SHORT TERM GOAL #3   Title  Pt wil decreased  5 x sit to stand from 17.2 sec to <15 sec for improved balance and functional strength.    Baseline  17.2 sec on 01/31/20    Time  4    Period  Weeks    Status  New    Target Date  03/01/20      PT SHORT TERM GOAL #4   Title  FGA and 6 min walk will be assessed and LTGs written to further assess balance and gait.    Time  4    Period  Weeks    Status  Achieved    Target Date  03/01/20        PT Long Term Goals - 02/02/20 1126      PT LONG TERM GOAL #1   Title  Pt will be independent with progressive HEP for strength, balance and gait to continue gains on own.    Time  8    Period  Weeks    Status  New      PT LONG TERM GOAL #2   Title  Pt will ambulate > 800' on varied surfaces independently with scanning environment for improved gait safety in community.    Time  8    Period  Weeks    Status  New      PT LONG TERM GOAL #3   Title  Pt will increase FGA from 19/30 to 24 or more for improved balance and gait safety.    Time  8    Period  Weeks    Status  New      PT LONG TERM GOAL #4   Title  Pt will increase 6 min walk to >950' without rest break for improved activity tolerance and community mobilty.    Baseline  805' with 1 standing rest break.    Time  8    Period  Weeks    Status  New            Plan - 02/08/20 1105    Clinical Impression Statement  Pt reported being less tired at end of session today. She was able to progress gait outside but does fatigue in hips as goes on. Pt was challenged with narrow BOS activities especially with eyes closed. Pt continues to benefit from skilled PT to continue to work on strength, balance, gait and improving activity tolerance.    Personal  Factors and Comorbidities  Comorbidity 3+    Comorbidities  T2DM, breast cancer, pituitary tumor s/p resection in 08/2005, bacterial meningitis, VP shunt placement due to hydrocephalus, CSF leak, and ventriculitis    Examination-Activity Limitations  Locomotion Level    Examination-Participation Restrictions  Community Activity;Driving    Stability/Clinical Decision Making  Evolving/Moderate complexity    Rehab Potential  Good    PT Frequency  2x / week    PT Duration  8 weeks    PT Treatment/Interventions  ADLs/Self Care Home Management;Cryotherapy;Moist Heat;Therapeutic activities;Therapeutic exercise;Balance training;Neuromuscular re-education;Manual techniques;Passive range of motion;Patient/family education;Vestibular    PT Next Visit Plan  How is initial HEP going as well as walking program? Gait with scanning environment. Continue with balance progression and hip strengthening. Aerobic exercise. Monitor HR. Has left visual field deficits.    Consulted and Agree with Plan of Care  Patient       Patient will benefit from skilled therapeutic intervention in order to improve the following deficits and impairments:  Abnormal gait, Decreased activity tolerance, Decreased balance, Impaired vision/preception, Decreased strength  Visit Diagnosis: Other  abnormalities of gait and mobility  Muscle weakness (generalized)     Problem List Patient Active Problem List   Diagnosis Date Noted  . Visual disturbance 11/03/2019  . Abnormality of gait 11/03/2019  . Anemia of chronic disease   . CKD (chronic kidney disease), stage II   . Controlled type 2 diabetes mellitus with hyperglycemia, without long-term current use of insulin ([place])   . Benign essential HTN   . 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 S/P VP shunt   . Diabetes mellitus type 2 in obese ([place])   . Acute blood loss anemia   . Elevated BUN 05/12/2019  . Elevated serum creatinine 05/12/2019   . Pituitary tumor 05/12/2019  . Vitamin D deficiency 05/12/2019  . DJD (degenerative joint disease) 05/12/2019  . 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 Febrile illness   . AKI (acute kidney injury) ([place]) 10/30/2018  . Pituitary adenoma ([place]) 10/30/2018  . Acute bacterial meningitis 10/30/2018  . Sepsis ([place]) 10/30/2018  . Headache 10/29/2018  . Acute encephalopathy 10/29/2018  . Rhabdomyolysis 10/29/2018  . History of pituitary adenoma 10/29/2018  . Glaucoma 10/29/2018  . Atherosclerosis 10/29/2018  . Acute purulent meningitis 10/29/2018  . CKD (chronic kidney disease), stage III 10/29/2018  . Meningitis 10/29/2018  . Fever 10/29/2018  . Malignant neoplasm of lower-outer quadrant of right breast of female, estrogen receptor negative ([place]) 06/09/2016  . Morbid obesity with BMI of 40.0-44.9, adult ([place]) 05/25/2015  . Type II diabetes mellitus, uncontrolled ([place]) 05/25/2015  . Normocytic anemia 04/27/2014  . Hyperlipidemia, unspecified 10/22/2011  . Essential hypertension with goal blood pressure less than 130/85 10/22/2011  . DM (diabetes mellitus) ([place]) 10/22/2011  . HTN (hypertension)   . Diabetes mellitus type II, non insulin dependent ([place])   . Dyslipidemia     Electa Sniff, PT, DPT, NCS 02/08/2020, 7:26 PM  [facility] [address] [place], [place], [address] Phone: [phone]   Fax:  [phone]  Name: [name] MRN: [id number] Date of Birth: 04-Mar-1956

## 2020-02-08 NOTE — Patient Instructions (Signed)
Access Code: [email or web address] URL: [email or web address] Date: 02/02/2020 Prepared by: Cherly Anderson  Exercises Heel Toe Raises with Counter Support - 2 x daily - 5 x weekly - 2 sets - 10 reps Standing Marching - 2 x daily - 5 x weekly - 2 sets - 10 reps Standing Hip Abduction with Counter Support - 2 x daily - 5 x weekly - 2 sets - 10 reps Standing Hip Extension with Counter Support - 2 x daily - 5 x weekly - 2 sets - 10 reps standing in corner eyes closed - 2 x daily - 7 x weekly - 1 sets - 3 reps - 30 sec hold Romberg Stance with Head Nods - 1 x daily - 7 x weekly - 3 sets - 10 reps

## 2020-02-09 ENCOUNTER — Encounter: Payer: Self-pay | Admitting: Physical Therapy

## 2020-02-09 ENCOUNTER — Ambulatory Visit: Payer: BC Managed Care – PPO | Admitting: Physical Therapy

## 2020-02-09 DIAGNOSIS — R2689 Other abnormalities of gait and mobility: Secondary | ICD-10-CM

## 2020-02-09 DIAGNOSIS — M6281 Muscle weakness (generalized): Secondary | ICD-10-CM

## 2020-02-10 NOTE — Therapy (Signed)
[facility] [address] [place], [place], [address] Phone: [phone]   Fax:  [phone]  Physical Therapy Treatment  Patient Details  Name: [name] MRN: [id number] Date of Birth: [birth year] Referring Provider (PT): Delice Lesch   Encounter Date: 02/09/2020  PT End of Session - 02/09/20 1105    Visit Number  4    Number of Visits  17    Date for PT Re-Evaluation  03/31/20    PT Start Time  1102    PT Stop Time  1145    PT Time Calculation (min)  43 min    Equipment Utilized During Treatment  Gait belt    Activity Tolerance  Patient tolerated treatment well    Behavior During Therapy  [facility] for tasks assessed/performed       Past Medical History:  Diagnosis Date  . Anemia   . Breast cancer ([place]) 12/29/13   right 6:00 o'clock, lower outer  . Cataract of left eye   . Diabetes mellitus    type 2  . Family history of malignant neoplasm of breast   . Glaucoma    MD just watching, no eye drops  . High cholesterol   . History of blood transfusion 2015   with chemo treatments  . History of radiation therapy 08/12/14- 10/11/14   right breast /50.4 Gy/28 fx, right breast boost/ 10 Gy/ 5 fx  . HTN (hypertension)   . Personal history of chemotherapy   . Personal history of radiation therapy   . Pituitary adenoma ([place]) 10/30/2018  . Wears dentures    full top-partial bottom  . Wears dentures    full  . Wears glasses   . Wears glasses     Past Surgical History:  Procedure Laterality Date  . ABDOMINAL HYSTERECTOMY  1993   endometriosis  . BREAST BIOPSY Left 12/29/2013  . BREAST LUMPECTOMY Right 02/08/2014  . BREAST LUMPECTOMY WITH AXILLARY LYMPH NODE BIOPSY Right 02/08/14   invasive ductal  . CRANIOTOMY N/A 04/15/2019   Procedure: Transphenoid resection of pituitary tumor with fat graft;  Surgeon: Judith Part, MD;  Location: [facility];  Service: Neurosurgery;  Laterality: N/A;  . IR IMAGE GUIDED  FLUID DRAIN BY CATHETER  04/19/2019  . MULTIPLE TOOTH EXTRACTIONS    . PLACEMENT OF LUMBAR DRAIN N/A 04/24/2019   Procedure: PLACEMENT OF LUMBAR DRAIN;  Surgeon: Eustace Moore, MD;  Location: [facility];  Service: Neurosurgery;  Laterality: N/A;  . PLACEMENT OF LUMBAR DRAIN N/A 05/16/2019   Procedure: PLACEMENT OF LUMBAR DRAIN;  Surgeon: Newman Pies, MD;  Location: [facility];  Service: Neurosurgery;  Laterality: N/A;  PLACEMENT OF LUMBAR DRAIN  . PORT-A-CATH REMOVAL    . PORTACATH PLACEMENT Left 02/08/2014   Procedure: INSERTION PORT-A-CATH;  Surgeon: Rolm Bookbinder, MD;  Location: [facility];  Service: General;  Laterality: Left;  . REPAIR OF CEREBROSPINAL FLUID LEAK N/A 04/29/2019   Procedure: Endoscopic Endonasal Cerebrospinal Fluid Leak repair, Abdominal wound revision and fat graft harvest, Intrathecal Fluorocein Injection;  Surgeon: Judith Part, MD;  Location: [facility];  Service: Neurosurgery;  Laterality: N/A;  . TONSILLECTOMY    . TRANSNASAL APPROACH N/A 04/15/2019   Procedure: TRANSNASAL APPROACH;  Surgeon: Jerrell Belfast, MD;  Location: [facility];  Service: ENT;  Laterality: N/A;  . TRANSPHENOIDAL APPROACH EXPOSURE N/A 04/15/2019   Procedure: ENDOSCOPIC TRANS NASAL APPROACH WITH FUSIONN;  Surgeon: Jerrell Belfast, MD;  Location: [facility];  Service: ENT;  Laterality:  N/A;  . TRANSPHENOIDAL APPROACH EXPOSURE N/A 04/29/2019   Procedure: Endoscopic repair of spinal fluid leak ;  Surgeon: Jerrell Belfast, MD;  Location: [facility];  Service: ENT;  Laterality: N/A;  . TRANSPHENOIDAL PITUITARY RESECTION  2006  . VENTRICULOPERITONEAL SHUNT Right 05/31/2019   Procedure: SHUNT INSERTION VENTRICULAR-PERITONEAL; REMOVAL OF LUMBAR DRAIN;  Surgeon: Judith Part, MD;  Location: [facility];  Service: Neurosurgery;  Laterality: Right;  SHUNT INSERTION VENTRICULAR-PERITONEAL; REMOVAL OF LUMBAR DRAIN  . WISDOM TOOTH EXTRACTION      There were no vitals filed for this visit.  Subjective Assessment -  02/09/20 1104    Subjective  No new complaints. No falls or pain to report. Has not been able to do new ex's since yesterday as she was tired after here and another appt when she got home.    Pertinent History  64 y.o. 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    Currently in Pain?  No/denies    Pain Score  0-No pain           [facility] Adult PT Treatment/Exercise - 02/09/20 1108      Transfers   Transfers  Sit to Stand;Stand to Sit    Sit to Stand  6: Modified independent (Device/Increase time);With upper extremity assist;From bed;From chair/3-in-1    Stand to Sit  6: Modified independent (Device/Increase time);With upper extremity assist;To bed;To chair/3-in-1      Ambulation/Gait   Ambulation/Gait  Yes    Ambulation/Gait Assistance  5: Supervision    Ambulation/Gait Assistance Details  with gait outdoors had pt scanning all directions, working on speed changes with foot scuffing noted, no balance loss noted. HR 108 after gait, decreased with rest break.     Ambulation Distance (Feet)  500 Feet   x1, plus around gym with activity   Assistive device  None    Gait Pattern  Step-through pattern;Trendelenburg    Ambulation Surface  Level;Unlevel;Indoor;Outdoor;Paved      Knee/Hip Exercises: Aerobic   Other Aerobic  Scifit UE/LE's level 3.0 for 6 minutes with goal >/= 30 RPM. HR 80 before, 92 afterwards           Balance Exercises - 02/09/20 1133      Balance Exercises: Standing   Standing Eyes Closed  Narrow base of support (BOS);Wide (BOA);Head turns;Foam/compliant surface;Other reps (comment);30 secs;Limitations    Standing Eyes Closed Limitations  standing across red foam beam: narrow BOS for EC no head movements, progressing to wider BOS for EC head movements left<>right, up<>down. min guard to min  assist for balance.     SLS with Vectors  Foam/compliant surface;Intermittent upper extremity assist;Other reps (comment);Limitations    SLS with Vectors Limitations  on 1 inch foam with 2 tall cones in front: alternating fwd foot taps, then cross foot taps for ~10 reps each side with cues on BOS and weight shifting to assist with balance. no UE support with min guard to min assist for balance.     Balance Beam  standing across red foam beam with no UE support, occasional touch to bars: alternating fwd stepping to floor/back onto beam, then alternating bwd stepping to floor/back onto beam for ~10 reps each with cues for increased step length/height and to maintain wider BOS to allow for weight shifting, min guard to min assist for balance.  PT Short Term Goals - 02/02/20 1126      PT SHORT TERM GOAL #1   Title  Pt will be independent with initial HEP for strength, balance and walking program.    Time  4    Period  Weeks    Status  New    Target Date  03/01/20      PT SHORT TERM GOAL #2   Title  Pt will increase gait speed to >0.31m/s for improved gait safety.    Baseline  0.34m/s on 01/31/20    Time  4    Period  Weeks    Status  New    Target Date  03/01/20      PT SHORT TERM GOAL #3   Title  Pt wil decreased 5 x sit to stand from 17.2 sec to <15 sec for improved balance and functional strength.    Baseline  17.2 sec on 01/31/20    Time  4    Period  Weeks    Status  New    Target Date  03/01/20      PT SHORT TERM GOAL #4   Title  FGA and 6 min walk will be assessed and LTGs written to further assess balance and gait.    Time  4    Period  Weeks    Status  Achieved    Target Date  03/01/20        PT Long Term Goals - 02/02/20 1126      PT LONG TERM GOAL #1   Title  Pt will be independent with progressive HEP for strength, balance and gait to continue gains on own.    Time  8    Period  Weeks    Status  New      PT LONG TERM GOAL #2   Title  Pt will  ambulate > 800' on varied surfaces independently with scanning environment for improved gait safety in community.    Time  8    Period  Weeks    Status  New      PT LONG TERM GOAL #3   Title  Pt will increase FGA from 19/30 to 24 or more for improved balance and gait safety.    Time  8    Period  Weeks    Status  New      PT LONG TERM GOAL #4   Title  Pt will increase 6 min walk to >950' without rest break for improved activity tolerance and community mobilty.    Baseline  805' with 1 standing rest break.    Time  8    Period  Weeks    Status  New            Plan - 02/09/20 1105    Clinical Impression Statement  Today's skilled session focused on dynamic gait and balance reaction training with no isssues reported or noted in session. Pt's HR did elevate with activity with max HR of 108 bpm, decreased with rest breaks each time. The pt is progressing toward goals and should benefit from continued PT to progress toward unmet goals.    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, CSF leak, and ventriculitis    Examination-Activity Limitations  Locomotion Level    Examination-Participation Restrictions  Community Activity;Driving    Stability/Clinical Decision Making  Evolving/Moderate  complexity    Rehab Potential  Good    PT Frequency  2x / week    PT Duration  8 weeks    PT Treatment/Interventions  ADLs/Self Care Home Management;Cryotherapy;Moist Heat;Therapeutic activities;Therapeutic exercise;Balance training;Neuromuscular re-education;Manual techniques;Passive range of motion;Patient/family education;Vestibular    PT Next Visit Plan  How is initial HEP going as well as walking program? Gait with scanning environment. Continue with balance progression and hip strengthening. Aerobic exercise. Monitor HR. Has left visual field  deficits.    Consulted and Agree with Plan of Care  Patient       Patient will benefit from skilled therapeutic intervention in order to improve the following deficits and impairments:  Abnormal gait, Decreased activity tolerance, Decreased balance, Impaired vision/preception, Decreased strength  Visit Diagnosis: Other abnormalities of gait and mobility  Muscle weakness (generalized)     Problem List Patient Active Problem List   Diagnosis Date Noted  . Visual disturbance 11/03/2019  . Abnormality of gait 11/03/2019  . Anemia of chronic disease   . CKD (chronic kidney disease), stage II   . Controlled type 2 diabetes mellitus with hyperglycemia, without long-term current use of insulin ([place])   . Benign essential HTN   . 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 S/P VP shunt   . Diabetes mellitus type 2 in obese ([place])   . Acute blood loss anemia   . Elevated BUN 05/12/2019  . Elevated serum creatinine 05/12/2019  . Pituitary tumor 05/12/2019  . Vitamin D deficiency 05/12/2019  . DJD (degenerative joint disease) 05/12/2019  . 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 Febrile illness   . AKI (acute kidney injury) ([place]) 10/30/2018  . Pituitary adenoma ([place]) 10/30/2018  . Acute bacterial meningitis 10/30/2018  . Sepsis ([place]) 10/30/2018  . Headache 10/29/2018  . Acute encephalopathy 10/29/2018  . Rhabdomyolysis 10/29/2018  . History of pituitary adenoma 10/29/2018  . Glaucoma 10/29/2018  . Atherosclerosis 10/29/2018  . Acute purulent meningitis 10/29/2018  . CKD (chronic kidney disease), stage III 10/29/2018  . Meningitis 10/29/2018  . Fever 10/29/2018  . Malignant neoplasm of lower-outer quadrant of right breast of female, estrogen receptor negative ([place]) 06/09/2016  . Morbid obesity with BMI of 40.0-44.9, adult ([place]) 05/25/2015  . Type II diabetes mellitus, uncontrolled ([place]) 05/25/2015  . Normocytic anemia  04/27/2014  . Hyperlipidemia, unspecified 10/22/2011  . Essential hypertension with goal blood pressure less than 130/85 10/22/2011  . DM (diabetes mellitus) ([place]) 10/22/2011  . HTN (hypertension)   . Diabetes mellitus type II, non insulin dependent ([place])   . Dyslipidemia     Willow Ora, PTA, [facility] [address], [place],  [address] [phone] 02/10/20, 9:57 AM   Name: Tearra Ouk MRN: [id number] Date of Birth: [birth year]

## 2020-02-13 ENCOUNTER — Ambulatory Visit: Payer: BC Managed Care – PPO

## 2020-02-13 ENCOUNTER — Other Ambulatory Visit: Payer: Self-pay

## 2020-02-13 ENCOUNTER — Telehealth: Payer: Self-pay | Admitting: Family Medicine

## 2020-02-13 DIAGNOSIS — M6281 Muscle weakness (generalized): Secondary | ICD-10-CM

## 2020-02-13 DIAGNOSIS — R2689 Other abnormalities of gait and mobility: Secondary | ICD-10-CM | POA: Diagnosis not present

## 2020-02-13 NOTE — Therapy (Signed)
[facility] [address] [place], [place], [address] Phone: [phone]   Fax:  [phone]  Physical Therapy Treatment  Patient Details  Name: [name] MRN: [id number] Date of Birth: [birth year] Referring Provider (PT): Delice Lesch   Encounter Date: 02/13/2020  PT End of Session - 02/13/20 1039    Visit Number  5    Number of Visits  17    Date for PT Re-Evaluation  03/31/20    PT Start Time  1038    PT Stop Time  1120    PT Time Calculation (min)  42 min    Equipment Utilized During Treatment  Gait belt    Activity Tolerance  Patient tolerated treatment well    Behavior During Therapy  [facility] for tasks assessed/performed       Past Medical History:  Diagnosis Date  . Anemia   . Breast cancer ([place]) 12/29/13   right 6:00 o'clock, lower outer  . Cataract of left eye   . Diabetes mellitus    type 2  . Family history of malignant neoplasm of breast   . Glaucoma    MD just watching, no eye drops  . High cholesterol   . History of blood transfusion 2015   with chemo treatments  . History of radiation therapy 08/12/14- 10/11/14   right breast /50.4 Gy/28 fx, right breast boost/ 10 Gy/ 5 fx  . HTN (hypertension)   . Personal history of chemotherapy   . Personal history of radiation therapy   . Pituitary adenoma ([facility]) 10/30/2018  . Wears dentures    full top-partial bottom  . Wears dentures    full  . Wears glasses   . Wears glasses     Past Surgical History:  Procedure Laterality Date  . ABDOMINAL HYSTERECTOMY  1993   endometriosis  . BREAST BIOPSY Left 12/29/2013  . BREAST LUMPECTOMY Right 02/08/2014  . BREAST LUMPECTOMY WITH AXILLARY LYMPH NODE BIOPSY Right 02/08/14   invasive ductal  . CRANIOTOMY N/A 04/15/2019   Procedure: Transphenoid resection of pituitary tumor with fat graft;  Surgeon: Judith Part, MD;  Location: [facility];  Service: Neurosurgery;  Laterality: N/A;  . IR IMAGE GUIDED  FLUID DRAIN BY CATHETER  04/19/2019  . MULTIPLE TOOTH EXTRACTIONS    . PLACEMENT OF LUMBAR DRAIN N/A 04/24/2019   Procedure: PLACEMENT OF LUMBAR DRAIN;  Surgeon: Eustace Moore, MD;  Location: [facility];  Service: Neurosurgery;  Laterality: N/A;  . PLACEMENT OF LUMBAR DRAIN N/A 05/16/2019   Procedure: PLACEMENT OF LUMBAR DRAIN;  Surgeon: Newman Pies, MD;  Location: [facility];  Service: Neurosurgery;  Laterality: N/A;  PLACEMENT OF LUMBAR DRAIN  . PORT-A-CATH REMOVAL    . PORTACATH PLACEMENT Left 02/08/2014   Procedure: INSERTION PORT-A-CATH;  Surgeon: Rolm Bookbinder, MD;  Location: [facility];  Service: General;  Laterality: Left;  . REPAIR OF CEREBROSPINAL FLUID LEAK N/A 04/29/2019   Procedure: Endoscopic Endonasal Cerebrospinal Fluid Leak repair, Abdominal wound revision and fat graft harvest, Intrathecal Fluorocein Injection;  Surgeon: Judith Part, MD;  Location: [facility];  Service: Neurosurgery;  Laterality: N/A;  . TONSILLECTOMY    . TRANSNASAL APPROACH N/A 04/15/2019   Procedure: TRANSNASAL APPROACH;  Surgeon: Jerrell Belfast, MD;  Location: [facility];  Service: ENT;  Laterality: N/A;  . TRANSPHENOIDAL APPROACH EXPOSURE N/A 04/15/2019   Procedure: ENDOSCOPIC TRANS NASAL APPROACH WITH FUSIONN;  Surgeon: Jerrell Belfast, MD;  Location: [facility];  Service: ENT;  Laterality:  N/A;  . TRANSPHENOIDAL APPROACH EXPOSURE N/A 04/29/2019   Procedure: Endoscopic repair of spinal fluid leak ;  Surgeon: Jerrell Belfast, MD;  Location: [facility];  Service: ENT;  Laterality: N/A;  . TRANSPHENOIDAL PITUITARY RESECTION  2006  . VENTRICULOPERITONEAL SHUNT Right 05/31/2019   Procedure: SHUNT INSERTION VENTRICULAR-PERITONEAL; REMOVAL OF LUMBAR DRAIN;  Surgeon: Judith Part, MD;  Location: [facility];  Service: Neurosurgery;  Laterality: Right;  SHUNT INSERTION VENTRICULAR-PERITONEAL; REMOVAL OF LUMBAR DRAIN  . WISDOM TOOTH EXTRACTION      There were no vitals filed for this visit.  Subjective Assessment -  02/13/20 1039    Subjective  Pt reports that she is doing pretty good. Left knee bothering her a bit when she is up on it. Thinks it is due to rain. Forgot cane at home today.    Pertinent History  64 y.o. 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    Currently in Pain?  Yes    Pain Location  Knee    Pain Orientation  Left    Pain Descriptors / Indicators  Aching    Pain Type  Chronic pain                        [facility] Adult PT Treatment/Exercise - 02/13/20 1040      Transfers   Transfers  Sit to Stand;Stand to Sit    Sit to Stand  6: Modified independent (Device/Increase time)    Stand to Sit  6: Modified independent (Device/Increase time)      Ambulation/Gait   Ambulation/Gait  Yes    Ambulation/Gait Assistance  5: Supervision    Ambulation/Gait Assistance Details  Pt ambulated for 5 min 30 sec around in clinc.  HR=88 after. PT had patient turn head left/right during a period of gait today. Did not bump anything on left.     Ambulation Distance (Feet)  600 Feet    Assistive device  None    Gait Pattern  Step-through pattern;Decreased stance time - left;Trendelenburg    Ambulation Surface  Level;Indoor      Neuro Re-ed    Neuro Re-ed Details   At counter: Standing on rockerboard positioned ant/post x 30 sec eyes open without UE support then eyes closed 30 sec x 2, then weight shifting an/post x 10. Pt had 1 LOB posterior requiring min assist to regain. Alternating toe taps from rockerboard x 10 with occasional right UE support especially with LLE stance CGA. Standing on rockerboard positioned lateral maintaining level x 30 sec eyes open then 30 sec x 2 eyes closed CGA then weight shifting side to side x 10 without UE support. Standing on foam beam without UE support 30 sec x 2 with CGA. 1 LOB  posterior when initially starting.       Knee/Hip Exercises: Aerobic   Other Aerobic  Sci Fit with arms and legs level 3 x 10 min, HR=102 at start as just finished balance exercises. Pt denied any fatigue. Pt keeping RPM around 48 throughout. HR post= 96 after             PT Education - 02/13/20 1124    Education Details  Pt to continue with current HEP    Person(s) Educated  Patient    Methods  Explanation    Comprehension  Verbalized understanding       PT Short Term Goals - 02/02/20 1126      PT SHORT TERM GOAL #1   Title  Pt will be independent with initial HEP for strength, balance and walking program.    Time  4    Period  Weeks    Status  New    Target Date  03/01/20      PT SHORT TERM GOAL #2   Title  Pt will increase gait speed to >0.63m/s for improved gait safety.    Baseline  0.49m/s on 01/31/20    Time  4    Period  Weeks    Status  New    Target Date  03/01/20      PT SHORT TERM GOAL #3   Title  Pt wil decreased 5 x sit to stand from 17.2 sec to <15 sec for improved balance and functional strength.    Baseline  17.2 sec on 01/31/20    Time  4    Period  Weeks    Status  New    Target Date  03/01/20      PT SHORT TERM GOAL #4   Title  FGA and 6 min walk will be assessed and LTGs written to further assess balance and gait.    Time  4    Period  Weeks    Status  Achieved    Target Date  03/01/20        PT Long Term Goals - 02/02/20 1126      PT LONG TERM GOAL #1   Title  Pt will be independent with progressive HEP for strength, balance and gait to continue gains on own.    Time  8    Period  Weeks    Status  New      PT LONG TERM GOAL #2   Title  Pt will ambulate > 800' on varied surfaces independently with scanning environment for improved gait safety in community.    Time  8    Period  Weeks    Status  New      PT LONG TERM GOAL #3   Title  Pt will increase FGA from 19/30 to 24 or more for improved balance and gait safety.    Time  8     Period  Weeks    Status  New      PT LONG TERM GOAL #4   Title  Pt will increase 6 min walk to >950' without rest break for improved activity tolerance and community mobilty.    Baseline  805' with 1 standing rest break.    Time  8    Period  Weeks    Status  New            Plan - 02/13/20 1124    Clinical Impression Statement  Pt continues to show progress with gait and improving activity tolerance. Good HR response. Biggest HR increase with standing balance activities today. Pt had occasional LOB posterior on compliant surfaces but overall did well.    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, CSF leak, and ventriculitis    Examination-Activity Limitations  Locomotion Level    Examination-Participation Restrictions  Community Activity;Driving    Stability/Clinical Decision Making  Evolving/Moderate complexity    Rehab Potential  Good    PT  Frequency  2x / week    PT Duration  8 weeks    PT Treatment/Interventions  ADLs/Self Care Home Management;Cryotherapy;Moist Heat;Therapeutic activities;Therapeutic exercise;Balance training;Neuromuscular re-education;Manual techniques;Passive range of motion;Patient/family education;Vestibular    PT Next Visit Plan  Gait with scanning environment. Continue with balance progression and hip strengthening. Aerobic exercise. Monitor HR. Has left visual field deficits.    Consulted and Agree with Plan of Care  Patient       Patient will benefit from skilled therapeutic intervention in order to improve the following deficits and impairments:  Abnormal gait, Decreased activity tolerance, Decreased balance, Impaired vision/preception, Decreased strength  Visit Diagnosis: Other abnormalities of gait and mobility  Muscle weakness (generalized)     Problem List Patient Active Problem List    Diagnosis Date Noted  . Visual disturbance 11/03/2019  . Abnormality of gait 11/03/2019  . Anemia of chronic disease   . CKD (chronic kidney disease), stage II   . Controlled type 2 diabetes mellitus with hyperglycemia, without long-term current use of insulin ([place])   . Benign essential HTN   . 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 S/P VP shunt   . Diabetes mellitus type 2 in obese ([place])   . Acute blood loss anemia   . Elevated BUN 05/12/2019  . Elevated serum creatinine 05/12/2019  . Pituitary tumor 05/12/2019  . Vitamin D deficiency 05/12/2019  . DJD (degenerative joint disease) 05/12/2019  . 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 Febrile illness   . AKI (acute kidney injury) ([place]) 10/30/2018  . Pituitary adenoma ([place]) 10/30/2018  . Acute bacterial meningitis 10/30/2018  . Sepsis ([facility]) 10/30/2018  . Headache 10/29/2018  . Acute encephalopathy 10/29/2018  . Rhabdomyolysis 10/29/2018  . History of pituitary adenoma 10/29/2018  . Glaucoma 10/29/2018  . Atherosclerosis 10/29/2018  . Acute purulent meningitis 10/29/2018  . CKD (chronic kidney disease), stage III 10/29/2018  . Meningitis 10/29/2018  . Fever 10/29/2018  . Malignant neoplasm of lower-outer quadrant of right breast of female, estrogen receptor negative ([place]) 06/09/2016  . Morbid obesity with BMI of 40.0-44.9, adult ([place]) 05/25/2015  . Type II diabetes mellitus, uncontrolled ([place]) 05/25/2015  . Normocytic anemia 04/27/2014  . Hyperlipidemia, unspecified 10/22/2011  . Essential hypertension with goal blood pressure less than 130/85 10/22/2011  . DM (diabetes mellitus) ([place]) 10/22/2011  . HTN (hypertension)   . Diabetes mellitus type II, non insulin dependent ([place])   . Dyslipidemia     Electa Sniff, PT, DPT, NCS 02/13/2020, 11:27 AM  [facility] [address] [place], [place],  [address] Phone: [phone]   Fax:  [phone]  Name: [name] MRN: [id number] Date of Birth: [birth year]

## 2020-02-13 NOTE — Telephone Encounter (Signed)
   Keylani Perlstein DOB: 22-Jan-1956 MRN: [id number]   RIDER WAIVER AND RELEASE OF LIABILITY  For purposes of improving physical access to our facilities, [facility] is pleased to partner with third parties to provide [facility] patients or other authorized individuals the option of convenient, on-demand ground transportation services (the Technical bre[employer]") through use of the technology service that enables users to request on-demand ground transportation from independent third-party providers.  By opting to use and accept these [employer], I, the undersigned, hereby agree on behalf of myself, and on behalf of any minor child using the [employer] for whom I am the parent or legal guardian, as follows:  1. Government social research officer provided to me are provided by independent third-party transportation providers who are not [employer] or employees and who are unaffiliated with [employer]. 2. [facility] is neither a transportation carrier nor a common or public carrier. 3. [facility] has no control over the quality or safety of the transportation that occurs as a result of the [employer]. 4. [facility] cannot guarantee that any third-party transportation provider will complete any arranged transportation service. 5. [facility] makes no representation, warranty, or guarantee regarding the reliability, timeliness, quality, safety, suitability, or availability of any of the Transport Services or that they will be error free. 6. I fully understand that traveling by vehicle involves risks and dangers of serious bodily injury, including permanent disability, paralysis, and death. I agree, on behalf of myself and on behalf of any minor child using the Transport Services for whom I am the parent or legal guardian, that the entire risk arising out of my use of the [employer] remains solely with me, to the maximum extent permitted under applicable law. 7. The  [employer] are provided "as is" and "as available." [facility] disclaims all representations and warranties, express, implied or statutory, not expressly set out in these terms, including the implied warranties of merchantability and fitness for a particular purpose. 8. I hereby waive and release [facility], its agents, employees, officers, directors, representatives, insurers, attorneys, assigns, successors, subsidiaries, and affiliates from any and all past, present, or future claims, demands, liabilities, actions, causes of action, or suits of any kind directly or indirectly arising from acceptance and use of the [employer]. 9. I further waive and release [facility] and its affiliates from all present and future liability and responsibility for any injury or death to persons or damages to property caused by or related to the use of the [employer]. 10. I have read this Waiver and Release of Liability, and I understand the terms used in it and their legal significance. This Waiver is freely and voluntarily given with the understanding that my right (as well as the right of any minor child for whom I am the parent or legal guardian using the [employer]) to legal recourse against [facility] in connection with the [employer] is knowingly surrendered in return for use of these services.   I attest that I read the consent document to Lilly Cove, gave Ms. Ground the opportunity to ask questions and answered the questions asked (if any). I affirm that Lilly Cove then provided consent for she's participation in this program.     Drucie Ip

## 2020-02-15 ENCOUNTER — Other Ambulatory Visit: Payer: Self-pay

## 2020-02-15 ENCOUNTER — Ambulatory Visit: Payer: BC Managed Care – PPO

## 2020-02-15 DIAGNOSIS — M6281 Muscle weakness (generalized): Secondary | ICD-10-CM

## 2020-02-15 DIAGNOSIS — R2689 Other abnormalities of gait and mobility: Secondary | ICD-10-CM | POA: Diagnosis not present

## 2020-02-15 NOTE — Patient Instructions (Signed)
Access Code: [email or web address] URL: [email or web address] Date: 02/15/2020 Prepared by: Cherly Anderson  Exercises Heel Toe Raises with Counter Support - 2 x daily - 5 x weekly - 2 sets - 10 reps Standing Marching - 2 x daily - 5 x weekly - 2 sets - 10 reps Standing Hip Abduction with Counter Support - 2 x daily - 5 x weekly - 2 sets - 10 reps Standing Hip Extension with Counter Support - 2 x daily - 5 x weekly - 2 sets - 10 reps standing in corner eyes closed - 2 x daily - 7 x weekly - 1 sets - 3 reps - 30 sec hold Romberg Stance with Head Nods - 1 x daily - 7 x weekly - 3 sets - 10 reps Supine Bridge - 1 x daily - 5 x weekly - 2 sets - 10 reps Clamshell - 1 x daily - 5 x weekly - 2 sets - 10 reps

## 2020-02-15 NOTE — Therapy (Signed)
[facility] [address] [place], [place], [address] Phone: [phone]   Fax:  [phone]  Physical Therapy Treatment  Patient Details  Name: [name] MRN: [id number] Date of Birth: Nov 13, 1955 Referring Provider (PT): Delice Lesch   Encounter Date: 02/15/2020  PT End of Session - 02/15/20 0931    Visit Number  6    Number of Visits  17    Date for PT Re-Evaluation  03/31/20    PT Start Time  0930    PT Stop Time  1012    PT Time Calculation (min)  42 min    Equipment Utilized During Treatment  Gait belt    Activity Tolerance  Patient tolerated treatment well    Behavior During Therapy  [facility] for tasks assessed/performed       Past Medical History:  Diagnosis Date   Anemia    Breast cancer ([place]) 12/29/13   right 6:00 o'clock, lower outer   Cataract of left eye    Diabetes mellitus    type 2   Family history of malignant neoplasm of breast    Glaucoma    MD just watching, no eye drops   High cholesterol    History of blood transfusion 2015   with chemo treatments   History of radiation therapy 08/12/14- 10/11/14   right breast /50.4 Gy/28 fx, right breast boost/ 10 Gy/ 5 fx   HTN (hypertension)    Personal history of chemotherapy    Personal history of radiation therapy    Pituitary adenoma ([place]) 10/30/2018   Wears dentures    full top-partial bottom   Wears dentures    full   Wears glasses    Wears glasses     Past Surgical History:  Procedure Laterality Date   ABDOMINAL HYSTERECTOMY  1993   endometriosis   BREAST BIOPSY Left 12/29/2013   BREAST LUMPECTOMY Right 02/08/2014   BREAST LUMPECTOMY WITH AXILLARY LYMPH NODE BIOPSY Right 02/08/14   invasive ductal   CRANIOTOMY N/A 04/15/2019   Procedure: Transphenoid resection of pituitary tumor with fat graft;  Surgeon: Judith Part, MD;  Location: [facility];  Service: Neurosurgery;  Laterality: N/A;   IR IMAGE GUIDED  FLUID DRAIN BY CATHETER  04/19/2019   MULTIPLE TOOTH EXTRACTIONS     PLACEMENT OF LUMBAR DRAIN N/A 04/24/2019   Procedure: PLACEMENT OF LUMBAR DRAIN;  Surgeon: Eustace Moore, MD;  Location: [facility];  Service: Neurosurgery;  Laterality: N/A;   PLACEMENT OF LUMBAR DRAIN N/A 05/16/2019   Procedure: PLACEMENT OF LUMBAR DRAIN;  Surgeon: Newman Pies, MD;  Location: [facility];  Service: Neurosurgery;  Laterality: N/A;  PLACEMENT OF LUMBAR DRAIN   PORT-A-CATH REMOVAL     PORTACATH PLACEMENT Left 02/08/2014   Procedure: INSERTION PORT-A-CATH;  Surgeon: Rolm Bookbinder, MD;  Location: [facility];  Service: General;  Laterality: Left;   REPAIR OF CEREBROSPINAL FLUID LEAK N/A 04/29/2019   Procedure: Endoscopic Endonasal Cerebrospinal Fluid Leak repair, Abdominal wound revision and fat graft harvest, Intrathecal Fluorocein Injection;  Surgeon: Judith Part, MD;  Location: [facility];  Service: Neurosurgery;  Laterality: N/A;   TONSILLECTOMY     TRANSNASAL APPROACH N/A 04/15/2019   Procedure: TRANSNASAL APPROACH;  Surgeon: Jerrell Belfast, MD;  Location: [facility];  Service: ENT;  Laterality: N/A;   TRANSPHENOIDAL APPROACH EXPOSURE N/A 04/15/2019   Procedure: ENDOSCOPIC TRANS NASAL APPROACH WITH FUSIONN;  Surgeon: Jerrell Belfast, MD;  Location: [facility];  Service: ENT;  Laterality:  N/A;   TRANSPHENOIDAL APPROACH EXPOSURE N/A 04/29/2019   Procedure: Endoscopic repair of spinal fluid leak ;  Surgeon: Jerrell Belfast, MD;  Location: [facility];  Service: ENT;  Laterality: N/A;   TRANSPHENOIDAL PITUITARY RESECTION  2006   VENTRICULOPERITONEAL SHUNT Right 05/31/2019   Procedure: SHUNT INSERTION VENTRICULAR-PERITONEAL; REMOVAL OF LUMBAR DRAIN;  Surgeon: Judith Part, MD;  Location: [facility];  Service: Neurosurgery;  Laterality: Right;  SHUNT INSERTION VENTRICULAR-PERITONEAL; REMOVAL OF LUMBAR DRAIN   WISDOM TOOTH EXTRACTION      There were no vitals filed for this visit.  Subjective Assessment -  02/15/20 0931    Subjective  Pt reports she is doing pretty good. Rubbed left knee down a bit last night as bothering her some.    Pertinent History  64 y.o. 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    Currently in Pain?  Yes    Pain Score  4     Pain Location  Knee    Pain Orientation  Left    Pain Descriptors / Indicators  Aching    Pain Type  Chronic pain                        [facility] Adult PT Treatment/Exercise - 02/15/20 0932      Ambulation/Gait   Ambulation/Gait  Yes    Ambulation/Gait Assistance  5: Supervision    Ambulation Distance (Feet)  230 Feet    Assistive device  None    Gait Pattern  Step-through pattern;Antalgic;Trendelenburg    Ambulation Surface  Level;Indoor      Neuro Re-ed    Neuro Re-ed Details   In // bars: Standing on airex with feet apart eyes open x 30 sec, eyes closed x 30 sec, head turns up/down and left/right x 10 each. Feet together on airex: eyes open x 30 sec then eyes closed x 30 sec. Mini-squats on airex x 10 with verbal cues for form. On blue mat side stepping 6' x 4 then backwards gait and forward march 6' x 6.  Pt did have increased sway on compliant surface with eyes closed and head turns but able to perform without physical assistance to correct just close SBA.       Exercises   Exercises  Other Exercises    Other Exercises   Hooklying bridge x 10 with verbal cues to tighten tummy to keep back flat, right sidelying for left clamshell 10 x 2 with verbal cues for form.      Knee/Hip Exercises: Aerobic   Other Aerobic  Sci Fit x 10 min level 3.2. Pt reported a little discomfort in left knee and could tell resistance was a little higher.  HR=88 after             PT Education - 02/15/20 1026    Education Details  Added to HEP for hip strengthening     Person(s) Educated  Patient    Methods  Explanation;Demonstration;Handout    Comprehension  Verbalized understanding       PT Short Term Goals - 02/02/20 1126      PT SHORT TERM GOAL #1   Title  Pt will be independent with initial HEP for strength, balance and walking program.    Time  4    Period  Weeks  Status  New    Target Date  03/01/20      PT SHORT TERM GOAL #2   Title  Pt will increase gait speed to >0.17m/s for improved gait safety.    Baseline  0.19m/s on 01/31/20    Time  4    Period  Weeks    Status  New    Target Date  03/01/20      PT SHORT TERM GOAL #3   Title  Pt wil decreased 5 x sit to stand from 17.2 sec to <15 sec for improved balance and functional strength.    Baseline  17.2 sec on 01/31/20    Time  4    Period  Weeks    Status  New    Target Date  03/01/20      PT SHORT TERM GOAL #4   Title  FGA and 6 min walk will be assessed and LTGs written to further assess balance and gait.    Time  4    Period  Weeks    Status  Achieved    Target Date  03/01/20        PT Long Term Goals - 02/02/20 1126      PT LONG TERM GOAL #1   Title  Pt will be independent with progressive HEP for strength, balance and gait to continue gains on own.    Time  8    Period  Weeks    Status  New      PT LONG TERM GOAL #2   Title  Pt will ambulate > 800' on varied surfaces independently with scanning environment for improved gait safety in community.    Time  8    Period  Weeks    Status  New      PT LONG TERM GOAL #3   Title  Pt will increase FGA from 19/30 to 24 or more for improved balance and gait safety.    Time  8    Period  Weeks    Status  New      PT LONG TERM GOAL #4   Title  Pt will increase 6 min walk to >950' without rest break for improved activity tolerance and community mobilty.    Baseline  805' with 1 standing rest break.    Time  8    Period  Weeks    Status  New            Plan - 02/15/20 1026    Clinical Impression Statement   Pt tolerated increased resistance on SciFit well with just a little discomfort in knee. Continued focus on hip strengthening and balance. Pt did not need any physical corrections with balance activities today.    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, CSF leak, and ventriculitis    Examination-Activity Limitations  Locomotion Level    Examination-Participation Restrictions  Community Activity;Driving    Stability/Clinical Decision Making  Evolving/Moderate complexity    Rehab Potential  Good    PT Frequency  2x / week    PT Duration  8 weeks    PT Treatment/Interventions  ADLs/Self Care Home Management;Cryotherapy;Moist Heat;Therapeutic activities;Therapeutic exercise;Balance training;Neuromuscular re-education;Manual techniques;Passive range of motion;Patient/family education;Vestibular    PT Next Visit Plan  Gait with scanning environment. Continue with balance progression and hip strengthening. Aerobic exercise. Monitor HR. Has left visual  field deficits.    Consulted and Agree with Plan of Care  Patient       Patient will benefit from skilled therapeutic intervention in order to improve the following deficits and impairments:  Abnormal gait, Decreased activity tolerance, Decreased balance, Impaired vision/preception, Decreased strength  Visit Diagnosis: Other abnormalities of gait and mobility  Muscle weakness (generalized)     Problem List Patient Active Problem List   Diagnosis Date Noted   Visual disturbance 11/03/2019   Abnormality of gait 11/03/2019   Anemia of chronic disease    CKD (chronic kidney disease), stage II    Controlled type 2 diabetes mellitus with hyperglycemia, without long-term current use of insulin (HCC)    Benign essential HTN    Cerebral ventriculitis 06/11/2019   Communicating hydrocephalus  (HCC)    S/P VP shunt    Diabetes mellitus type 2 in obese (HCC)    Acute blood loss anemia    Elevated BUN 05/12/2019   Elevated serum creatinine 05/12/2019   Pituitary tumor 05/12/2019   Vitamin D deficiency 05/12/2019   DJD (degenerative joint disease) 05/12/2019   CSF leak 04/24/2019   Nasal septal perforation 03/16/2019   Febrile illness    AKI (acute kidney injury) ([place]) 10/30/2018   Pituitary adenoma ([place]) 10/30/2018   Acute bacterial meningitis 10/30/2018   Sepsis ([place]) 10/30/2018   Headache 10/29/2018   Acute encephalopathy 10/29/2018   Rhabdomyolysis 10/29/2018   History of pituitary adenoma 10/29/2018   Glaucoma 10/29/2018   Atherosclerosis 10/29/2018   Acute purulent meningitis 10/29/2018   CKD (chronic kidney disease), stage III 10/29/2018   Meningitis 10/29/2018   Fever 10/29/2018   Malignant neoplasm of lower-outer quadrant of right breast of female, estrogen receptor negative ([place]) 06/09/2016   Morbid obesity with BMI of 40.0-44.9, adult ([place]) 05/25/2015   Type II diabetes mellitus, uncontrolled ([place]) 05/25/2015   Normocytic anemia 04/27/2014   Hyperlipidemia, unspecified 10/22/2011   Essential hypertension with goal blood pressure less than 130/85 10/22/2011   DM (diabetes mellitus) ([place]) 10/22/2011   HTN (hypertension)    Diabetes mellitus type II, non insulin dependent ([place])    Dyslipidemia     Electa Sniff, PT, DPT, NCS 02/15/2020, 10:28 AM  [facility] [address] [place], [place], [address] Phone: [phone]   Fax:  [phone]  Name: Marquasia Schmieder MRN: [id number] Date of Birth: [birth year]

## 2020-02-20 ENCOUNTER — Ambulatory Visit: Payer: BC Managed Care – PPO

## 2020-02-20 ENCOUNTER — Other Ambulatory Visit: Payer: Self-pay

## 2020-02-20 DIAGNOSIS — R2689 Other abnormalities of gait and mobility: Secondary | ICD-10-CM

## 2020-02-20 DIAGNOSIS — M6281 Muscle weakness (generalized): Secondary | ICD-10-CM

## 2020-02-20 NOTE — Therapy (Signed)
[facility] [address] [place], [place], [address] Phone: [phone]   Fax:  [phone]  Physical Therapy Treatment  Patient Details  Name: [name] MRN: [id number] Date of Birth: [birth year] Referring Provider (PT): Delice Lesch   Encounter Date: 02/20/2020   PT End of Session - 02/20/20 1018    Visit Number 7    Number of Visits 17    Date for PT Re-Evaluation 03/31/20    PT Start Time 1016    PT Stop Time 1101    PT Time Calculation (min) 45 min    Equipment Utilized During Treatment Gait belt    Activity Tolerance Patient tolerated treatment well    Behavior During Therapy [facility] for tasks assessed/performed           Past Medical History:  Diagnosis Date  . Anemia   . Breast cancer ([place]) 12/29/13   right 6:00 o'clock, lower outer  . Cataract of left eye   . Diabetes mellitus    type 2  . Family history of malignant neoplasm of breast   . Glaucoma    MD just watching, no eye drops  . High cholesterol   . History of blood transfusion 2015   with chemo treatments  . History of radiation therapy 08/12/14- 10/11/14   right breast /50.4 Gy/28 fx, right breast boost/ 10 Gy/ 5 fx  . HTN (hypertension)   . Personal history of chemotherapy   . Personal history of radiation therapy   . Pituitary adenoma ([facility]) 10/30/2018  . Wears dentures    full top-partial bottom  . Wears dentures    full  . Wears glasses   . Wears glasses     Past Surgical History:  Procedure Laterality Date  . ABDOMINAL HYSTERECTOMY  1993   endometriosis  . BREAST BIOPSY Left 12/29/2013  . BREAST LUMPECTOMY Right 02/08/2014  . BREAST LUMPECTOMY WITH AXILLARY LYMPH NODE BIOPSY Right 02/08/14   invasive ductal  . CRANIOTOMY N/A 04/15/2019   Procedure: Transphenoid resection of pituitary tumor with fat graft;  Surgeon: Judith Part, MD;  Location: [facility];  Service: Neurosurgery;  Laterality: N/A;  . IR IMAGE GUIDED  FLUID DRAIN BY CATHETER  04/19/2019  . MULTIPLE TOOTH EXTRACTIONS    . PLACEMENT OF LUMBAR DRAIN N/A 04/24/2019   Procedure: PLACEMENT OF LUMBAR DRAIN;  Surgeon: Eustace Moore, MD;  Location: [facility];  Service: Neurosurgery;  Laterality: N/A;  . PLACEMENT OF LUMBAR DRAIN N/A 05/16/2019   Procedure: PLACEMENT OF LUMBAR DRAIN;  Surgeon: Newman Pies, MD;  Location: [facility];  Service: Neurosurgery;  Laterality: N/A;  PLACEMENT OF LUMBAR DRAIN  . PORT-A-CATH REMOVAL    . PORTACATH PLACEMENT Left 02/08/2014   Procedure: INSERTION PORT-A-CATH;  Surgeon: Rolm Bookbinder, MD;  Location: [facility];  Service: General;  Laterality: Left;  . REPAIR OF CEREBROSPINAL FLUID LEAK N/A 04/29/2019   Procedure: Endoscopic Endonasal Cerebrospinal Fluid Leak repair, Abdominal wound revision and fat graft harvest, Intrathecal Fluorocein Injection;  Surgeon: Judith Part, MD;  Location: [facility];  Service: Neurosurgery;  Laterality: N/A;  . TONSILLECTOMY    . TRANSNASAL APPROACH N/A 04/15/2019   Procedure: TRANSNASAL APPROACH;  Surgeon: Jerrell Belfast, MD;  Location: [facility];  Service: ENT;  Laterality: N/A;  . TRANSPHENOIDAL APPROACH EXPOSURE N/A 04/15/2019   Procedure: ENDOSCOPIC TRANS NASAL APPROACH WITH FUSIONN;  Surgeon: Jerrell Belfast, MD;  Location: [facility];  Service: ENT;  Laterality: N/A;  . TRANSPHENOIDAL  APPROACH EXPOSURE N/A 04/29/2019   Procedure: Endoscopic repair of spinal fluid leak ;  Surgeon: Jerrell Belfast, MD;  Location: [facility];  Service: ENT;  Laterality: N/A;  . TRANSPHENOIDAL PITUITARY RESECTION  2006  . VENTRICULOPERITONEAL SHUNT Right 05/31/2019   Procedure: SHUNT INSERTION VENTRICULAR-PERITONEAL; REMOVAL OF LUMBAR DRAIN;  Surgeon: Judith Part, MD;  Location: [facility];  Service: Neurosurgery;  Laterality: Right;  SHUNT INSERTION VENTRICULAR-PERITONEAL; REMOVAL OF LUMBAR DRAIN  . WISDOM TOOTH EXTRACTION      There were no vitals filed for this visit.   Subjective Assessment  - 02/20/20 1018    Subjective Pt reports that she is doing pretty good. Was tired on Saturday so didn't do much of anything.    Pertinent History 64 y.o. 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    Currently in Pain? Yes    Pain Score 6     Pain Location Knee    Pain Orientation Left    Pain Descriptors / Indicators Aching    Pain Type Chronic pain                             [facility] Adult PT Treatment/Exercise - 02/20/20 1019      Ambulation/Gait   Ambulation/Gait Yes    Ambulation/Gait Assistance 5: Supervision    Ambulation/Gait Assistance Details Pt has trendelenberg left with slight antalgic gait. Verbal cues to pick up foot more over grass and provided close supervision in grass. HR=114 after gait. Rechecked after 5 min break and HR=98.    Ambulation Distance (Feet) 850 Feet    Assistive device None    Gait Pattern Step-through pattern    Ambulation Surface Level;Unlevel;Outdoor      Neuro Re-ed    Neuro Re-ed Details  In // bars: Standing on airex: feet together x 30 sec eyes open, 30 sec eyes closed, head turns up/down and left/right x 10 CGA for safety. Pt had increased sway with head turns. Staggered stance on airex 30 sec x 2 each position CGA. Pt had increased sway with activity and was a little more diffficult with left leg posterior. Tandem gait 6' x 4 with 1 UE support.       Knee/Hip Exercises: Aerobic   Other Aerobic Sci Fit x 10 min level 3.2. Pt reported she could feel some discomfort in left knee but not too bad. HR= 100 after      Manual Therapy   Manual Therapy Joint mobilization    Manual therapy comments Grade 3 A/P knee mobs 4 bouts of 20 sec. Pt reported knee felt some better after.                  PT Education - 02/20/20 1053    Education Details Pt to  continue with current HEP    Person(s) Educated Patient    Methods Explanation    Comprehension Verbalized understanding            PT Short Term Goals - 02/02/20 1126      PT SHORT TERM GOAL #1   Title Pt will be independent with initial HEP for strength, balance and walking program.    Time 4    Period Weeks    Status New    Target Date 03/01/20  PT SHORT TERM GOAL #2   Title Pt will increase gait speed to >0.19m/s for improved gait safety.    Baseline 0.50m/s on 01/31/20    Time 4    Period Weeks    Status New    Target Date 03/01/20      PT SHORT TERM GOAL #3   Title Pt wil decreased 5 x sit to stand from 17.2 sec to <15 sec for improved balance and functional strength.    Baseline 17.2 sec on 01/31/20    Time 4    Period Weeks    Status New    Target Date 03/01/20      PT SHORT TERM GOAL #4   Title FGA and 6 min walk will be assessed and LTGs written to further assess balance and gait.    Time 4    Period Weeks    Status Achieved    Target Date 03/01/20             PT Long Term Goals - 02/02/20 1126      PT LONG TERM GOAL #1   Title Pt will be independent with progressive HEP for strength, balance and gait to continue gains on own.    Time 8    Period Weeks    Status New      PT LONG TERM GOAL #2   Title Pt will ambulate > 800' on varied surfaces independently with scanning environment for improved gait safety in community.    Time 8    Period Weeks    Status New      PT LONG TERM GOAL #3   Title Pt will increase FGA from 19/30 to 24 or more for improved balance and gait safety.    Time 8    Period Weeks    Status New      PT LONG TERM GOAL #4   Title Pt will increase 6 min walk to >950' without rest break for improved activity tolerance and community mobilty.    Baseline 805' with 1 standing rest break.    Time 8    Period Weeks    Status New                 Plan - 02/20/20 1814    Clinical Impression Statement Pt continues to  show improvements in activity tolerance with increased gait distance today. HR increases with gait more than on Sci-Fit. No increase in knee pain by end of session. Pt continues to benefit from skilled PT to improve strength, balance and activity tolerance.    Personal Factors and Comorbidities Comorbidity 3+    Comorbidities T2DM, breast cancer, pituitary tumor s/p resection in 08/2005, bacterial meningitis, VP shunt placement due to hydrocephalus, CSF leak, and ventriculitis    Examination-Activity Limitations Locomotion Level    Examination-Participation Restrictions Community Activity;Driving    Stability/Clinical Decision Making Evolving/Moderate complexity    Rehab Potential Good    PT Frequency 2x / week    PT Duration 8 weeks    PT Treatment/Interventions ADLs/Self Care Home Management;Cryotherapy;Moist Heat;Therapeutic activities;Therapeutic exercise;Balance training;Neuromuscular re-education;Manual techniques;Passive range of motion;Patient/family education;Vestibular    PT Next Visit Plan Gait with scanning environment. Continue with balance progression and hip strengthening. Aerobic exercise. Monitor HR. Has left visual field deficits.    Consulted and Agree with Plan of Care Patient           Patient will benefit from skilled therapeutic intervention in order to improve the following deficits and  impairments:  Abnormal gait, Decreased activity tolerance, Decreased balance, Impaired vision/preception, Decreased strength  Visit Diagnosis: Other abnormalities of gait and mobility  Muscle weakness (generalized)     Problem List Patient Active Problem List   Diagnosis Date Noted  . Visual disturbance 11/03/2019  . Abnormality of gait 11/03/2019  . Anemia of chronic disease   . CKD (chronic kidney disease), stage II   . Controlled type 2 diabetes mellitus with hyperglycemia, without long-term current use of insulin ([place])   . Benign essential HTN   . Cerebral ventriculitis  06/11/2019  . Communicating hydrocephalus ([place])   . S/P VP shunt   . Diabetes mellitus type 2 in obese ([place])   . Acute blood loss anemia   . Elevated BUN 05/12/2019  . Elevated serum creatinine 05/12/2019  . Pituitary tumor 05/12/2019  . Vitamin D deficiency 05/12/2019  . DJD (degenerative joint disease) 05/12/2019  . 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 Febrile illness   . AKI (acute kidney injury) ([place]) 10/30/2018  . Pituitary adenoma ([place]) 10/30/2018  . Acute bacterial meningitis 10/30/2018  . Sepsis ([place]) 10/30/2018  . Headache 10/29/2018  . Acute encephalopathy 10/29/2018  . Rhabdomyolysis 10/29/2018  . History of pituitary adenoma 10/29/2018  . Glaucoma 10/29/2018  . Atherosclerosis 10/29/2018  . Acute purulent meningitis 10/29/2018  . CKD (chronic kidney disease), stage III 10/29/2018  . Meningitis 10/29/2018  . Fever 10/29/2018  . Malignant neoplasm of lower-outer quadrant of right breast of female, estrogen receptor negative ([place]) 06/09/2016  . Morbid obesity with BMI of 40.0-44.9, adult ([place]) 05/25/2015  . Type II diabetes mellitus, uncontrolled ([place]) 05/25/2015  . Normocytic anemia 04/27/2014  . Hyperlipidemia, unspecified 10/22/2011  . Essential hypertension with goal blood pressure less than 130/85 10/22/2011  . DM (diabetes mellitus) ([facility]) 10/22/2011  . HTN (hypertension)   . Diabetes mellitus type II, non insulin dependent ([place])   . Dyslipidemia     Electa Sniff, PT, DPT, NCS 02/20/2020, 6:20 PM  [facility] [address] [place], [place], [address] Phone: [phone]   Fax:  [phone]  Name: Yarielys Beed MRN: [id number] Date of Birth: 11-21-1955

## 2020-02-22 ENCOUNTER — Other Ambulatory Visit: Payer: Self-pay

## 2020-02-22 ENCOUNTER — Encounter: Payer: Self-pay | Admitting: Physical Therapy

## 2020-02-22 ENCOUNTER — Ambulatory Visit: Payer: BC Managed Care – PPO | Admitting: Physical Therapy

## 2020-02-22 VITALS — HR 83

## 2020-02-22 DIAGNOSIS — R2689 Other abnormalities of gait and mobility: Secondary | ICD-10-CM | POA: Diagnosis not present

## 2020-02-22 DIAGNOSIS — M6281 Muscle weakness (generalized): Secondary | ICD-10-CM

## 2020-02-23 NOTE — Therapy (Signed)
[facility] [address] [place], [place], [address] Phone: [phone]   Fax:  [phone]  Physical Therapy Treatment  Patient Details  Name: [name] MRN: [id number] Date of Birth: 12-23-1955 Referring Provider (PT): Delice Lesch   Encounter Date: 02/22/2020   PT End of Session - 02/22/20 0936    Visit Number 8    Number of Visits 17    Date for PT Re-Evaluation 03/31/20    PT Start Time 0932    PT Stop Time 1015    PT Time Calculation (min) 43 min    Equipment Utilized During Treatment Gait belt    Activity Tolerance Patient tolerated treatment well    Behavior During Therapy [facility] for tasks assessed/performed           Past Medical History:  Diagnosis Date  . Anemia   . Breast cancer ([place]) 12/29/13   right 6:00 o'clock, lower outer  . Cataract of left eye   . Diabetes mellitus    type 2  . Family history of malignant neoplasm of breast   . Glaucoma    MD just watching, no eye drops  . High cholesterol   . History of blood transfusion 2015   with chemo treatments  . History of radiation therapy 08/12/14- 10/11/14   right breast /50.4 Gy/28 fx, right breast boost/ 10 Gy/ 5 fx  . HTN (hypertension)   . Personal history of chemotherapy   . Personal history of radiation therapy   . Pituitary adenoma ([place]) 10/30/2018  . Wears dentures    full top-partial bottom  . Wears dentures    full  . Wears glasses   . Wears glasses     Past Surgical History:  Procedure Laterality Date  . ABDOMINAL HYSTERECTOMY  1993   endometriosis  . BREAST BIOPSY Left 12/29/2013  . BREAST LUMPECTOMY Right 02/08/2014  . BREAST LUMPECTOMY WITH AXILLARY LYMPH NODE BIOPSY Right 02/08/14   invasive ductal  . CRANIOTOMY N/A 04/15/2019   Procedure: Transphenoid resection of pituitary tumor with fat graft;  Surgeon: Judith Part, MD;  Location: [facility];  Service: Neurosurgery;  Laterality: N/A;  . IR IMAGE GUIDED  FLUID DRAIN BY CATHETER  04/19/2019  . MULTIPLE TOOTH EXTRACTIONS    . PLACEMENT OF LUMBAR DRAIN N/A 04/24/2019   Procedure: PLACEMENT OF LUMBAR DRAIN;  Surgeon: Eustace Moore, MD;  Location: [facility];  Service: Neurosurgery;  Laterality: N/A;  . PLACEMENT OF LUMBAR DRAIN N/A 05/16/2019   Procedure: PLACEMENT OF LUMBAR DRAIN;  Surgeon: Newman Pies, MD;  Location: [facility];  Service: Neurosurgery;  Laterality: N/A;  PLACEMENT OF LUMBAR DRAIN  . PORT-A-CATH REMOVAL    . PORTACATH PLACEMENT Left 02/08/2014   Procedure: INSERTION PORT-A-CATH;  Surgeon: Rolm Bookbinder, MD;  Location: [facility];  Service: General;  Laterality: Left;  . REPAIR OF CEREBROSPINAL FLUID LEAK N/A 04/29/2019   Procedure: Endoscopic Endonasal Cerebrospinal Fluid Leak repair, Abdominal wound revision and fat graft harvest, Intrathecal Fluorocein Injection;  Surgeon: Judith Part, MD;  Location: [facility];  Service: Neurosurgery;  Laterality: N/A;  . TONSILLECTOMY    . TRANSNASAL APPROACH N/A 04/15/2019   Procedure: TRANSNASAL APPROACH;  Surgeon: Jerrell Belfast, MD;  Location: [facility];  Service: ENT;  Laterality: N/A;  . TRANSPHENOIDAL APPROACH EXPOSURE N/A 04/15/2019   Procedure: ENDOSCOPIC TRANS NASAL APPROACH WITH FUSIONN;  Surgeon: Jerrell Belfast, MD;  Location: [facility];  Service: ENT;  Laterality: N/A;  . TRANSPHENOIDAL  APPROACH EXPOSURE N/A 04/29/2019   Procedure: Endoscopic repair of spinal fluid leak ;  Surgeon: Jerrell Belfast, MD;  Location: [facility];  Service: ENT;  Laterality: N/A;  . TRANSPHENOIDAL PITUITARY RESECTION  2006  . VENTRICULOPERITONEAL SHUNT Right 05/31/2019   Procedure: SHUNT INSERTION VENTRICULAR-PERITONEAL; REMOVAL OF LUMBAR DRAIN;  Surgeon: Judith Part, MD;  Location: [facility];  Service: Neurosurgery;  Laterality: Right;  SHUNT INSERTION VENTRICULAR-PERITONEAL; REMOVAL OF LUMBAR DRAIN  . WISDOM TOOTH EXTRACTION      Vitals:   02/22/20 0935  Pulse: 83  SpO2: 100%     Subjective  Assessment - 02/22/20 0935    Subjective No new complaints. No falls. Continues with chronic left knee pain.    Pertinent History 64 y.o. 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    Currently in Pain? Yes    Pain Score 6    reports this is her baseline knee pain, can decr with activity at times   Pain Location Knee    Pain Orientation Left    Pain Descriptors / Indicators Aching    Pain Type Chronic pain    Pain Onset More than a month ago    Pain Frequency Intermittent    Aggravating Factors  when first getting up    Pain Relieving Factors movement decreases pain at time, rest, medicated cream               [facility] Adult PT Treatment/Exercise - 02/22/20 0939      Transfers   Transfers Sit to Stand;Stand to Sit    Sit to Stand 6: Modified independent (Device/Increase time)    Stand to Sit 6: Modified independent (Device/Increase time)      Ambulation/Gait   Ambulation/Gait Yes    Ambulation/Gait Assistance 5: Supervision;4: Min guard;4: Min assist    Ambulation/Gait Assistance Details supervision on paved surfaces, mostly min guard assist on complaint surfaces with one episode of fwd balance loss going down grassy hill needing min assist to correct.     Ambulation Distance (Feet) 1000 Feet    Assistive device None    Gait Pattern Step-through pattern;Decreased stride length;Poor foot clearance - left;Poor foot clearance - right    Ambulation Surface Level;Unlevel;Indoor;Outdoor;Paved;Gravel;Grass      Exercises   Exercises Other Exercises    Other Exercises  with UE support red band 4 way hip kicks for 10 reps each way, bil LE's, with cues on form and technique.                Balance Exercises - 02/22/20 0001      Balance Exercises: Standing   Standing Eyes Closed Narrow base of support  (BOS);Foam/compliant surface;3 reps;30 secs;Limitations    Standing Eyes Closed Limitations on airex with no UE support EC no head movements, min guard to min assist for balance    Partial Tandem Stance Eyes closed;Foam/compliant surface;Intermittent upper extremity support;2 reps;30 secs;Limitations    Partial Tandem Stance Limitations on airex with no UE support, occasional touch to bars, 2 reps each foot forward                PT Short Term Goals - 02/02/20 1126      PT SHORT TERM GOAL #1   Title Pt will be independent with initial HEP for strength, balance and walking program.  Time 4    Period Weeks    Status New    Target Date 03/01/20      PT SHORT TERM GOAL #2   Title Pt will increase gait speed to >0.40m/s for improved gait safety.    Baseline 0.76m/s on 01/31/20    Time 4    Period Weeks    Status New    Target Date 03/01/20      PT SHORT TERM GOAL #3   Title Pt wil decreased 5 x sit to stand from 17.2 sec to <15 sec for improved balance and functional strength.    Baseline 17.2 sec on 01/31/20    Time 4    Period Weeks    Status New    Target Date 03/01/20      PT SHORT TERM GOAL #4   Title FGA and 6 min walk will be assessed and LTGs written to further assess balance and gait.    Time 4    Period Weeks    Status Achieved    Target Date 03/01/20             PT Long Term Goals - 02/02/20 1126      PT LONG TERM GOAL #1   Title Pt will be independent with progressive HEP for strength, balance and gait to continue gains on own.    Time 8    Period Weeks    Status New      PT LONG TERM GOAL #2   Title Pt will ambulate > 800' on varied surfaces independently with scanning environment for improved gait safety in community.    Time 8    Period Weeks    Status New      PT LONG TERM GOAL #3   Title Pt will increase FGA from 19/30 to 24 or more for improved balance and gait safety.    Time 8    Period Weeks    Status New      PT LONG TERM GOAL #4    Title Pt will increase 6 min walk to >950' without rest break for improved activity tolerance and community mobilty.    Baseline 805' with 1 standing rest break.    Time 8    Period Weeks    Status New                 Plan - 02/22/20 4034    Clinical Impression Statement Today's skilled session continued to focus on activity tolerance, strengthening and balance reactions. Rest breaks taken through out session. HR max 113 bpm with activity with quick recovery back into the mid 80's. The pt is progressing toward goals and should benefit from continued PT to progress toward unmet goals.    Personal Factors and Comorbidities Comorbidity 3+    Comorbidities T2DM, breast cancer, pituitary tumor s/p resection in 08/2005, bacterial meningitis, VP shunt placement due to hydrocephalus, CSF leak, and ventriculitis    Examination-Activity Limitations Locomotion Level    Examination-Participation Restrictions Community Activity;Driving    Stability/Clinical Decision Making Evolving/Moderate complexity    Rehab Potential Good    PT Frequency 2x / week    PT Duration 8 weeks    PT Treatment/Interventions ADLs/Self Care Home Management;Cryotherapy;Moist Heat;Therapeutic activities;Therapeutic exercise;Balance training;Neuromuscular re-education;Manual techniques;Passive range of motion;Patient/family education;Vestibular    PT Next Visit Plan Gait with scanning environment. Continue with balance progression and hip strengthening. Aerobic exercise. Monitor HR. Has left visual field deficits.    Consulted and Agree with  Plan of Care Patient           Patient will benefit from skilled therapeutic intervention in order to improve the following deficits and impairments:  Abnormal gait, Decreased activity tolerance, Decreased balance, Impaired vision/preception, Decreased strength  Visit Diagnosis: Other abnormalities of gait and mobility  Muscle weakness (generalized)     Problem  List Patient Active Problem List   Diagnosis Date Noted  . Visual disturbance 11/03/2019  . Abnormality of gait 11/03/2019  . Anemia of chronic disease   . CKD (chronic kidney disease), stage II   . Controlled type 2 diabetes mellitus with hyperglycemia, without long-term current use of insulin ([place])   . Benign essential HTN   . 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 S/P VP shunt   . Diabetes mellitus type 2 in obese ([place])   . Acute blood loss anemia   . Elevated BUN 05/12/2019  . Elevated serum creatinine 05/12/2019  . Pituitary tumor 05/12/2019  . Vitamin D deficiency 05/12/2019  . DJD (degenerative joint disease) 05/12/2019  . 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 Febrile illness   . AKI (acute kidney injury) ([place]) 10/30/2018  . Pituitary adenoma ([place]) 10/30/2018  . Acute bacterial meningitis 10/30/2018  . Sepsis ([place]) 10/30/2018  . Headache 10/29/2018  . Acute encephalopathy 10/29/2018  . Rhabdomyolysis 10/29/2018  . History of pituitary adenoma 10/29/2018  . Glaucoma 10/29/2018  . Atherosclerosis 10/29/2018  . Acute purulent meningitis 10/29/2018  . CKD (chronic kidney disease), stage III 10/29/2018  . Meningitis 10/29/2018  . Fever 10/29/2018  . Malignant neoplasm of lower-outer quadrant of right breast of female, estrogen receptor negative ([place]) 06/09/2016  . Morbid obesity with BMI of 40.0-44.9, adult ([place]) 05/25/2015  . Type II diabetes mellitus, uncontrolled ([place]) 05/25/2015  . Normocytic anemia 04/27/2014  . Hyperlipidemia, unspecified 10/22/2011  . Essential hypertension with goal blood pressure less than 130/85 10/22/2011  . DM (diabetes mellitus) ([place]) 10/22/2011  . HTN (hypertension)   . Diabetes mellitus type II, non insulin dependent ([place])   . Dyslipidemia     Willow Ora, PTA, [facility] [address], [place], [place] [address] [phone] 02/23/20, 7:58 AM    Name: Bee Marchiano MRN: [id number] Date of Birth: 18-Nov-1955

## 2020-02-28 ENCOUNTER — Other Ambulatory Visit: Payer: Self-pay

## 2020-02-28 ENCOUNTER — Ambulatory Visit: Payer: BC Managed Care – PPO | Admitting: Physical Therapy

## 2020-02-28 ENCOUNTER — Encounter: Payer: Self-pay | Admitting: Physical Therapy

## 2020-02-28 VITALS — HR 78

## 2020-02-28 DIAGNOSIS — R2689 Other abnormalities of gait and mobility: Secondary | ICD-10-CM | POA: Diagnosis not present

## 2020-02-28 DIAGNOSIS — M6281 Muscle weakness (generalized): Secondary | ICD-10-CM

## 2020-02-29 ENCOUNTER — Encounter (HOSPITAL_COMMUNITY): Payer: Self-pay

## 2020-02-29 ENCOUNTER — Ambulatory Visit (HOSPITAL_COMMUNITY)
Admission: RE | Admit: 2020-02-29 | Discharge: 2020-02-29 | Disposition: A | Discharge status: Home / Self Care | Payer: BC Managed Care – PPO | Source: Ambulatory Visit | Attending: Neurological Surgery | Admitting: Neurological Surgery

## 2020-02-29 DIAGNOSIS — D352 Benign neoplasm of pituitary gland: Secondary | ICD-10-CM

## 2020-02-29 NOTE — Therapy (Signed)
[facility] [address] [place], [place], [address] Phone: [phone]   Fax:  [phone]  Physical Therapy Treatment  Patient Details  Name: [name] MRN: [id number] Date of Birth: March 19, 1956 Referring Provider (PT): Delice Lesch   Encounter Date: 02/28/2020   02/28/20 1109  PT Visits / Re-Eval  Visit Number 9  Number of Visits 17  Date for PT Re-Evaluation 03/31/20  PT Time Calculation  PT Start Time 1103  PT Stop Time 1145  PT Time Calculation (min) 42 min  PT - End of Session  Equipment Utilized During Treatment Gait belt  Activity Tolerance Patient tolerated treatment well  Behavior During Therapy [facility] for tasks assessed/performed     Past Medical History:  Diagnosis Date  . Anemia   . Breast cancer ([place]) 12/29/13   right 6:00 o'clock, lower outer  . Cataract of left eye   . Diabetes mellitus    type 2  . Family history of malignant neoplasm of breast   . Glaucoma    MD just watching, no eye drops  . High cholesterol   . History of blood transfusion 2015   with chemo treatments  . History of radiation therapy 08/12/14- 10/11/14   right breast /50.4 Gy/28 fx, right breast boost/ 10 Gy/ 5 fx  . HTN (hypertension)   . Personal history of chemotherapy   . Personal history of radiation therapy   . Pituitary adenoma ([place]) 10/30/2018  . Wears dentures    full top-partial bottom  . Wears dentures    full  . Wears glasses   . Wears glasses     Past Surgical History:  Procedure Laterality Date  . ABDOMINAL HYSTERECTOMY  1993   endometriosis  . BREAST BIOPSY Left 12/29/2013  . BREAST LUMPECTOMY Right 02/08/2014  . BREAST LUMPECTOMY WITH AXILLARY LYMPH NODE BIOPSY Right 02/08/14   invasive ductal  . CRANIOTOMY N/A 04/15/2019   Procedure: Transphenoid resection of pituitary tumor with fat graft;  Surgeon: Judith Part, MD;  Location: [facility];  Service: Neurosurgery;  Laterality: N/A;  .  IR IMAGE GUIDED FLUID DRAIN BY CATHETER  04/19/2019  . MULTIPLE TOOTH EXTRACTIONS    . PLACEMENT OF LUMBAR DRAIN N/A 04/24/2019   Procedure: PLACEMENT OF LUMBAR DRAIN;  Surgeon: Eustace Moore, MD;  Location: [facility];  Service: Neurosurgery;  Laterality: N/A;  . PLACEMENT OF LUMBAR DRAIN N/A 05/16/2019   Procedure: PLACEMENT OF LUMBAR DRAIN;  Surgeon: Newman Pies, MD;  Location: [facility];  Service: Neurosurgery;  Laterality: N/A;  PLACEMENT OF LUMBAR DRAIN  . PORT-A-CATH REMOVAL    . PORTACATH PLACEMENT Left 02/08/2014   Procedure: INSERTION PORT-A-CATH;  Surgeon: Rolm Bookbinder, MD;  Location: [facility];  Service: General;  Laterality: Left;  . REPAIR OF CEREBROSPINAL FLUID LEAK N/A 04/29/2019   Procedure: Endoscopic Endonasal Cerebrospinal Fluid Leak repair, Abdominal wound revision and fat graft harvest, Intrathecal Fluorocein Injection;  Surgeon: Judith Part, MD;  Location: [facility];  Service: Neurosurgery;  Laterality: N/A;  . TONSILLECTOMY    . TRANSNASAL APPROACH N/A 04/15/2019   Procedure: TRANSNASAL APPROACH;  Surgeon: Jerrell Belfast, MD;  Location: [facility];  Service: ENT;  Laterality: N/A;  . TRANSPHENOIDAL APPROACH EXPOSURE N/A 04/15/2019   Procedure: ENDOSCOPIC TRANS NASAL APPROACH WITH FUSIONN;  Surgeon: Jerrell Belfast, MD;  Location: [facility];  Service: ENT;  Laterality: N/A;  . TRANSPHENOIDAL APPROACH EXPOSURE N/A 04/29/2019   Procedure: Endoscopic repair of spinal fluid leak ;  Surgeon: Jerrell Belfast, MD;  Location: [facility];  Service: ENT;  Laterality: N/A;  . TRANSPHENOIDAL PITUITARY RESECTION  2006  . VENTRICULOPERITONEAL SHUNT Right 05/31/2019   Procedure: SHUNT INSERTION VENTRICULAR-PERITONEAL; REMOVAL OF LUMBAR DRAIN;  Surgeon: Judith Part, MD;  Location: [facility];  Service: Neurosurgery;  Laterality: Right;  SHUNT INSERTION VENTRICULAR-PERITONEAL; REMOVAL OF LUMBAR DRAIN  . WISDOM TOOTH EXTRACTION      Vitals:   02/28/20 1106  Pulse: 78  SpO2: 99%      02/28/20 1106  Symptoms/Limitations  Subjective No falls. No new complaints. Continues with chronic left knee pain.  Pertinent History 64 y.o. female history of T2DM, breast cancer, pituitary tumor s/p resection in 08/2005, bacterial meningitis, VP shunt placement due to hydrocephalus, CSF leak, and ventriculitis  Patient Stated Goals Pt wants to get better.  Pain Assessment  Currently in Pain? Yes  Pain Score 5 (reports 5-6/10 as her baseline)  Pain Location Knee  Pain Orientation Left  Pain Descriptors / Indicators Aching  Pain Type Chronic pain  Pain Onset More than a month ago  Pain Frequency Intermittent  Aggravating Factors  when first getting up  Pain Relieving Factors movement decreases pain at times, rest, medicated cream      02/28/20 1111  Transfers  Transfers Sit to Stand;Stand to Sit  Sit to Stand 6: Modified independent (Device/Increase time)  Five time sit to stand comments  13.06 no UE support using standard height chair  Stand to Sit 6: Modified independent (Device/Increase time)  Ambulation/Gait  Ambulation/Gait Yes  Ambulation/Gait Assistance 5: Supervision  Assistive device None  Gait Pattern Step-through pattern;Decreased stride length;Poor foot clearance - left;Poor foot clearance - right  Ambulation Surface Level;Indoor  Gait velocity 10.90 sec's= 1.09 m/s no AD      02/28/20 1111  High Level Balance  High Level Balance Activities Marching forwards;Marching backwards;Tandem walking (tandem/toe/heel fwd/bwd)  High Level Balance Comments on red mat next to parallel bars with intermittent to single UE support for balance, 3 laps each/each way with cues on form/technique.   Neuro Re-ed   Neuro Re-ed Details  for balance/muscle re-ed: 6 cones along edge of red mat- alternating foot taps to each with side stepping for 2 laps each way with intermittent to single UE support on bar, cues on stance position, weight shifting and for increased hip/knee  flexion at times. min guard to min assist for balance.          PT Short Term Goals - 02/28/20 1109      PT SHORT TERM GOAL #1   Title Pt will be independent with initial HEP for strength, balance and walking program.    Baseline 02/28/20: met with current program (pt reports they are still challenging)    Status Achieved    Target Date 03/01/20      PT SHORT TERM GOAL #2   Title Pt will increase gait speed to >0.84ms for improved gait safety.    Baseline 0.637m on 01/31/20    Time 4    Period Weeks    Status New    Target Date 03/01/20      PT SHORT TERM GOAL #3   Title Pt wil decreased 5 x sit to stand from 17.2 sec to <15 sec for improved balance and functional strength.    Baseline 17.2 sec on 01/31/20    Time 4    Period Weeks    Status New    Target Date 03/01/20  PT SHORT TERM GOAL #4   Title FGA and 6 min walk will be assessed and LTGs written to further assess balance and gait.    Status Achieved    Target Date 03/01/20             PT Long Term Goals - 02/02/20 1126      PT LONG TERM GOAL #1   Title Pt will be independent with progressive HEP for strength, balance and gait to continue gains on own.    Time 8    Period Weeks    Status New      PT LONG TERM GOAL #2   Title Pt will ambulate > 800' on varied surfaces independently with scanning environment for improved gait safety in community.    Time 8    Period Weeks    Status New      PT LONG TERM GOAL #3   Title Pt will increase FGA from 19/30 to 24 or more for improved balance and gait safety.    Time 8    Period Weeks    Status New      PT LONG TERM GOAL #4   Title Pt will increase 6 min walk to >950' without rest break for improved activity tolerance and community mobilty.    Baseline 805' with 1 standing rest break.    Time 8    Period Weeks    Status New             02/28/20 1109  Plan  Clinical Impression Statement Today's skilled session initially focused on progress toward  STGs with all goals met. Remainder of session continued to address balance reactions on compliant surfaces with up to min assist needed. Rest breaks taken throughout session. The pt is progressing toward goals and should benefit from continued PT to progress toward unmet goals.  Personal Factors and Comorbidities Comorbidity 3+  Comorbidities T2DM, breast cancer, pituitary tumor s/p resection in 08/2005, bacterial meningitis, VP shunt placement due to hydrocephalus, CSF leak, and ventriculitis  Examination-Activity Limitations Locomotion Level  Examination-Participation Restrictions Community Activity;Driving  Pt will benefit from skilled therapeutic intervention in order to improve on the following deficits Abnormal gait;Decreased activity tolerance;Decreased balance;Impaired vision/preception;Decreased strength  Stability/Clinical Decision Making Evolving/Moderate complexity  Rehab Potential Good  PT Frequency 2x / week  PT Duration 8 weeks  PT Treatment/Interventions ADLs/Self Care Home Management;Cryotherapy;Moist Heat;Therapeutic activities;Therapeutic exercise;Balance training;Neuromuscular re-education;Manual techniques;Passive range of motion;Patient/family education;Vestibular  PT Next Visit Plan Gait with scanning environment. Continue with balance progression and hip strengthening. Aerobic exercise. Monitor HR. Has left visual field deficits.  Consulted and Agree with Plan of Care Patient          Patient will benefit from skilled therapeutic intervention in order to improve the following deficits and impairments:  Abnormal gait, Decreased activity tolerance, Decreased balance, Impaired vision/preception, Decreased strength  Visit Diagnosis: Other abnormalities of gait and mobility  Muscle weakness (generalized)     Problem List Patient Active Problem List   Diagnosis Date Noted  . Visual disturbance 11/03/2019  . Abnormality of gait 11/03/2019  . Anemia of chronic  disease   . CKD (chronic kidney disease), stage II   . Controlled type 2 diabetes mellitus with hyperglycemia, without long-term current use of insulin ([place])   . Benign essential HTN   . 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 S/P VP shunt   . Diabetes mellitus type 2 in obese ([place])   . Acute  blood loss anemia   . Elevated BUN 05/12/2019  . Elevated serum creatinine 05/12/2019  . Pituitary tumor 05/12/2019  . Vitamin D deficiency 05/12/2019  . DJD (degenerative joint disease) 05/12/2019  . 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 Febrile illness   . AKI (acute kidney injury) ([place]) 10/30/2018  . Pituitary adenoma ([place]) 10/30/2018  . Acute bacterial meningitis 10/30/2018  . Sepsis ([place]) 10/30/2018  . Headache 10/29/2018  . Acute encephalopathy 10/29/2018  . Rhabdomyolysis 10/29/2018  . History of pituitary adenoma 10/29/2018  . Glaucoma 10/29/2018  . Atherosclerosis 10/29/2018  . Acute purulent meningitis 10/29/2018  . CKD (chronic kidney disease), stage III 10/29/2018  . Meningitis 10/29/2018  . Fever 10/29/2018  . Malignant neoplasm of lower-outer quadrant of right breast of female, estrogen receptor negative ([place]) 06/09/2016  . Morbid obesity with BMI of 40.0-44.9, adult ([place]) 05/25/2015  . Type II diabetes mellitus, uncontrolled ([place]) 05/25/2015  . Normocytic anemia 04/27/2014  . Hyperlipidemia, unspecified 10/22/2011  . Essential hypertension with goal blood pressure less than 130/85 10/22/2011  . DM (diabetes mellitus) ([place]) 10/22/2011  . HTN (hypertension)   . Diabetes mellitus type II, non insulin dependent ([place])   . Dyslipidemia     Willow Ora, PTA, [facility] [address], [place], [place] [address] [phone] 02/29/20, 8:26 PM   Name: [name] MRN: [id number] Date of Birth: 12/23/1955

## 2020-02-29 NOTE — Progress Notes (Signed)
Pt has a Certas shunt valve that needs reprogramming post MRI. Pt was unaware. PT is to call MRI once she sets up an appt with Dr Zada Finders then we will work her in that day for her MRI. She will then go to his office to have the valve checked per guidelines of manufacture. PT was instructed to call MRI [phone] After she gets her appt set up with his office. I called and they informed me he was out of town until July 8th. Pt will set up after he returns. Per RN no PA at that office there that could check program settings today.

## 2020-03-01 ENCOUNTER — Ambulatory Visit: Payer: BC Managed Care – PPO | Admitting: Physical Therapy

## 2020-03-01 ENCOUNTER — Other Ambulatory Visit: Payer: Self-pay

## 2020-03-01 ENCOUNTER — Encounter: Payer: Self-pay | Admitting: Physical Therapy

## 2020-03-01 DIAGNOSIS — R2689 Other abnormalities of gait and mobility: Secondary | ICD-10-CM

## 2020-03-01 DIAGNOSIS — M6281 Muscle weakness (generalized): Secondary | ICD-10-CM

## 2020-03-01 NOTE — Therapy (Signed)
[facility] [address] [place], [place], [address] Phone: [phone]   Fax:  [phone]  Physical Therapy Treatment  Patient Details  Name: [name] MRN: [id number] Date of Birth: 1956-06-03 Referring Provider (PT): Delice Lesch   Encounter Date: 03/01/2020   PT End of Session - 03/01/20 1019    Visit Number 10    Number of Visits 17    Date for PT Re-Evaluation 03/31/20    PT Start Time 1018    PT Stop Time 1100    PT Time Calculation (min) 42 min    Equipment Utilized During Treatment Gait belt    Activity Tolerance Patient tolerated treatment well    Behavior During Therapy [facility] for tasks assessed/performed           Past Medical History:  Diagnosis Date  . Anemia   . Breast cancer ([place]) 12/29/13   right 6:00 o'clock, lower outer  . Cataract of left eye   . Diabetes mellitus    type 2  . Family history of malignant neoplasm of breast   . Glaucoma    MD just watching, no eye drops  . High cholesterol   . History of blood transfusion 2015   with chemo treatments  . History of radiation therapy 08/12/14- 10/11/14   right breast /50.4 Gy/28 fx, right breast boost/ 10 Gy/ 5 fx  . HTN (hypertension)   . Personal history of chemotherapy   . Personal history of radiation therapy   . Pituitary adenoma ([place]) 10/30/2018  . Wears dentures    full top-partial bottom  . Wears dentures    full  . Wears glasses   . Wears glasses     Past Surgical History:  Procedure Laterality Date  . ABDOMINAL HYSTERECTOMY  1993   endometriosis  . BREAST BIOPSY Left 12/29/2013  . BREAST LUMPECTOMY Right 02/08/2014  . BREAST LUMPECTOMY WITH AXILLARY LYMPH NODE BIOPSY Right 02/08/14   invasive ductal  . CRANIOTOMY N/A 04/15/2019   Procedure: Transphenoid resection of pituitary tumor with fat graft;  Surgeon: Judith Part, MD;  Location: [facility];  Service: Neurosurgery;  Laterality: N/A;  . IR IMAGE GUIDED  FLUID DRAIN BY CATHETER  04/19/2019  . MULTIPLE TOOTH EXTRACTIONS    . PLACEMENT OF LUMBAR DRAIN N/A 04/24/2019   Procedure: PLACEMENT OF LUMBAR DRAIN;  Surgeon: Eustace Moore, MD;  Location: [facility];  Service: Neurosurgery;  Laterality: N/A;  . PLACEMENT OF LUMBAR DRAIN N/A 05/16/2019   Procedure: PLACEMENT OF LUMBAR DRAIN;  Surgeon: Newman Pies, MD;  Location: [place];  Service: Neurosurgery;  Laterality: N/A;  PLACEMENT OF LUMBAR DRAIN  . PORT-A-CATH REMOVAL    . PORTACATH PLACEMENT Left 02/08/2014   Procedure: INSERTION PORT-A-CATH;  Surgeon: Rolm Bookbinder, MD;  Location: [facility];  Service: General;  Laterality: Left;  . REPAIR OF CEREBROSPINAL FLUID LEAK N/A 04/29/2019   Procedure: Endoscopic Endonasal Cerebrospinal Fluid Leak repair, Abdominal wound revision and fat graft harvest, Intrathecal Fluorocein Injection;  Surgeon: Judith Part, MD;  Location: [facility];  Service: Neurosurgery;  Laterality: N/A;  . TONSILLECTOMY    . TRANSNASAL APPROACH N/A 04/15/2019   Procedure: TRANSNASAL APPROACH;  Surgeon: Jerrell Belfast, MD;  Location: [facility];  Service: ENT;  Laterality: N/A;  . TRANSPHENOIDAL APPROACH EXPOSURE N/A 04/15/2019   Procedure: ENDOSCOPIC TRANS NASAL APPROACH WITH FUSIONN;  Surgeon: Jerrell Belfast, MD;  Location: [place] [facility];  Service: ENT;  Laterality: N/A;  . TRANSPHENOIDAL  APPROACH EXPOSURE N/A 04/29/2019   Procedure: Endoscopic repair of spinal fluid leak ;  Surgeon: Jerrell Belfast, MD;  Location: [facility];  Service: ENT;  Laterality: N/A;  . TRANSPHENOIDAL PITUITARY RESECTION  2006  . VENTRICULOPERITONEAL SHUNT Right 05/31/2019   Procedure: SHUNT INSERTION VENTRICULAR-PERITONEAL; REMOVAL OF LUMBAR DRAIN;  Surgeon: Judith Part, MD;  Location: [place];  Service: Neurosurgery;  Laterality: Right;  SHUNT INSERTION VENTRICULAR-PERITONEAL; REMOVAL OF LUMBAR DRAIN  . WISDOM TOOTH EXTRACTION      There were no vitals filed for this visit.   Subjective Assessment  - 03/01/20 1017    Subjective No new complaints. No falls. Continues with chronic left knee pain.    Pertinent History 64 y.o. 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    Currently in Pain? Yes    Pain Score 5    reports 5-6/10 as her baseline   Pain Location Knee    Pain Orientation Left    Pain Descriptors / Indicators Aching    Pain Type Chronic pain    Pain Onset More than a month ago    Pain Frequency Intermittent    Aggravating Factors  when first getting up    Pain Relieving Factors movement decreases pain at times, rest, medicated cream                [facility] Adult PT Treatment/Exercise - 03/01/20 1020      Transfers   Transfers Sit to Stand;Stand to Sit    Sit to Stand 6: Modified independent (Device/Increase time)    Stand to Sit 6: Modified independent (Device/Increase time)      Ambulation/Gait   Ambulation/Gait Yes    Ambulation/Gait Assistance 5: Supervision    Ambulation/Gait Assistance Details HR 111 bpm after gait outdoors. decreased with seated rest break.     Ambulation Distance (Feet) 1000 Feet    Assistive device None;Straight cane    Gait Pattern Step-through pattern;Decreased stride length;Poor foot clearance - left;Poor foot clearance - right    Ambulation Surface Level;Unlevel;Indoor;Outdoor;Paved      Neuro Re-ed    Neuro Re-ed Details  for balance/muscle re-ed: on blue foam beam- side stepping let<>right, then tandem gait fwd/bwd for 3 laps each with light UE support, min guard assist for balance, cues on posture and ex form/technique; then standing across blue foam beam- alternating fwd stepping to floor/back onto beam, then alternating bwd stepping to floor/back onto beam for ~10 reps each side, cues for increased step length/height, min guard assist with light UE support.        Exercises   Exercises Other Exercises    Other Exercises  standing on inverted BOSU- single leg stance in center for contralateral LE fwd/lateral/bwd kicks for 2 sets of 5 reps each leg. bil UE support on bars with cues on posture/ex form.                 PT Short Term Goals - 02/29/20 2105      PT SHORT TERM GOAL #1   Title Pt will be independent with initial HEP for strength, balance and walking program.    Baseline 02/28/20: met with current program (pt reports they are still challenging)    Status Achieved    Target Date 03/01/20      PT SHORT TERM GOAL #2  Title Pt will increase gait speed to >0.3ms for improved gait safety.    Baseline 02/28/20: 1.09 m/s no AD    Status Achieved    Target Date 03/01/20      PT SHORT TERM GOAL #3   Title Pt wil decreased 5 x sit to stand from 17.2 sec to <15 sec for improved balance and functional strength.    Baseline 02/28/20: 13.06 sec's no UE support from standard height chair    Status Achieved    Target Date 03/01/20      PT SHORT TERM GOAL #4   Title FGA and 6 min walk will be assessed and LTGs written to further assess balance and gait.    Status Achieved    Target Date 03/01/20             PT Long Term Goals - 02/02/20 1126      PT LONG TERM GOAL #1   Title Pt will be independent with progressive HEP for strength, balance and gait to continue gains on own.    Time 8    Period Weeks    Status New      PT LONG TERM GOAL #2   Title Pt will ambulate > 800' on varied surfaces independently with scanning environment for improved gait safety in community.    Time 8    Period Weeks    Status New      PT LONG TERM GOAL #3   Title Pt will increase FGA from 19/30 to 24 or more for improved balance and gait safety.    Time 8    Period Weeks    Status New      PT LONG TERM GOAL #4   Title Pt will increase 6 min walk to >950' without rest break for improved activity tolerance and community mobilty.    Baseline  805' with 1 standing rest break.    Time 8    Period Weeks    Status New                 Plan - 03/01/20 1019    Clinical Impression Statement Today's skilled session continued to focus on activity tolerance and balance reactions with rest breaks taken as needed. The pt is progressing toward goals and should benefit from continued PT to progress toward unmet goals.    Personal Factors and Comorbidities Comorbidity 3+    Comorbidities T2DM, breast cancer, pituitary tumor s/p resection in 08/2005, bacterial meningitis, VP shunt placement due to hydrocephalus, CSF leak, and ventriculitis    Examination-Activity Limitations Locomotion Level    Examination-Participation Restrictions Community Activity;Driving    Stability/Clinical Decision Making Evolving/Moderate complexity    Rehab Potential Good    PT Frequency 2x / week    PT Duration 8 weeks    PT Treatment/Interventions ADLs/Self Care Home Management;Cryotherapy;Moist Heat;Therapeutic activities;Therapeutic exercise;Balance training;Neuromuscular re-education;Manual techniques;Passive range of motion;Patient/family education;Vestibular    PT Next Visit Plan Gait with scanning environment. Continue with balance progression and hip strengthening. Aerobic exercise. Monitor HR. Has left visual field deficits.    Consulted and Agree with Plan of Care Patient           Patient will benefit from skilled therapeutic intervention in order to improve the following deficits and impairments:  Abnormal gait, Decreased activity tolerance, Decreased balance, Impaired vision/preception, Decreased strength  Visit Diagnosis: Other abnormalities of gait and mobility  Muscle weakness (generalized)     Problem List Patient Active Problem List  Diagnosis Date Noted  . Visual disturbance 11/03/2019  . Abnormality of gait 11/03/2019  . Anemia of chronic disease   . CKD (chronic kidney disease), stage II   . Controlled type 2 diabetes  mellitus with hyperglycemia, without long-term current use of insulin ([place])   . Benign essential HTN   . 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 S/P VP shunt   . Diabetes mellitus type 2 in obese ([place])   . Acute blood loss anemia   . Elevated BUN 05/12/2019  . Elevated serum creatinine 05/12/2019  . Pituitary tumor 05/12/2019  . Vitamin D deficiency 05/12/2019  . DJD (degenerative joint disease) 05/12/2019  . 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 Febrile illness   . AKI (acute kidney injury) ([place]) 10/30/2018  . Pituitary adenoma ([place]) 10/30/2018  . Acute bacterial meningitis 10/30/2018  . Sepsis ([place]) 10/30/2018  . Headache 10/29/2018  . Acute encephalopathy 10/29/2018  . Rhabdomyolysis 10/29/2018  . History of pituitary adenoma 10/29/2018  . Glaucoma 10/29/2018  . Atherosclerosis 10/29/2018  . Acute purulent meningitis 10/29/2018  . CKD (chronic kidney disease), stage III 10/29/2018  . Meningitis 10/29/2018  . Fever 10/29/2018  . Malignant neoplasm of lower-outer quadrant of right breast of female, estrogen receptor negative ([place]) 06/09/2016  . Morbid obesity with BMI of 40.0-44.9, adult ([place]) 05/25/2015  . Type II diabetes mellitus, uncontrolled ([place]) 05/25/2015  . Normocytic anemia 04/27/2014  . Hyperlipidemia, unspecified 10/22/2011  . Essential hypertension with goal blood pressure less than 130/85 10/22/2011  . DM (diabetes mellitus) ([place]) 10/22/2011  . HTN (hypertension)   . Diabetes mellitus type II, non insulin dependent ([place])   . Dyslipidemia     Willow Ora, PTA, [facility] [address] [place], [place] [address] [phone] 03/01/20, 9:11 PM   Name: [name] MRN: [id number] Date of Birth: May 24, 1956

## 2020-03-05 ENCOUNTER — Ambulatory Visit: Payer: BC Managed Care – PPO

## 2020-03-05 ENCOUNTER — Other Ambulatory Visit: Payer: Self-pay

## 2020-03-05 VITALS — HR 78

## 2020-03-05 DIAGNOSIS — R2689 Other abnormalities of gait and mobility: Secondary | ICD-10-CM

## 2020-03-05 DIAGNOSIS — M6281 Muscle weakness (generalized): Secondary | ICD-10-CM

## 2020-03-05 NOTE — Therapy (Signed)
[facility] [address] [place], [place], [address] Phone: [phone]   Fax:  [phone]  Physical Therapy Treatment  Patient Details  Name: [name] MRN: [id number] Date of Birth: [birth year] Referring Provider (PT): Delice Lesch   Encounter Date: 03/05/2020   PT End of Session - 03/05/20 1106    Visit Number 11    Number of Visits 17    Date for PT Re-Evaluation 03/31/20    PT Start Time 1105    PT Stop Time 1144    PT Time Calculation (min) 39 min    Equipment Utilized During Treatment Gait belt    Activity Tolerance Patient tolerated treatment well    Behavior During Therapy [facility] for tasks assessed/performed           Past Medical History:  Diagnosis Date  . Anemia   . Breast cancer ([place]) 12/29/13   right 6:00 o'clock, lower outer  . Cataract of left eye   . Diabetes mellitus    type 2  . Family history of malignant neoplasm of breast   . Glaucoma    MD just watching, no eye drops  . High cholesterol   . History of blood transfusion 2015   with chemo treatments  . History of radiation therapy 08/12/14- 10/11/14   right breast /50.4 Gy/28 fx, right breast boost/ 10 Gy/ 5 fx  . HTN (hypertension)   . Personal history of chemotherapy   . Personal history of radiation therapy   . Pituitary adenoma ([place]) 10/30/2018  . Wears dentures    full top-partial bottom  . Wears dentures    full  . Wears glasses   . Wears glasses     Past Surgical History:  Procedure Laterality Date  . ABDOMINAL HYSTERECTOMY  1993   endometriosis  . BREAST BIOPSY Left 12/29/2013  . BREAST LUMPECTOMY Right 02/08/2014  . BREAST LUMPECTOMY WITH AXILLARY LYMPH NODE BIOPSY Right 02/08/14   invasive ductal  . CRANIOTOMY N/A 04/15/2019   Procedure: Transphenoid resection of pituitary tumor with fat graft;  Surgeon: Judith Part, MD;  Location: [facility];  Service: Neurosurgery;  Laterality: N/A;  . IR IMAGE GUIDED  FLUID DRAIN BY CATHETER  04/19/2019  . MULTIPLE TOOTH EXTRACTIONS    . PLACEMENT OF LUMBAR DRAIN N/A 04/24/2019   Procedure: PLACEMENT OF LUMBAR DRAIN;  Surgeon: Eustace Moore, MD;  Location: [facility];  Service: Neurosurgery;  Laterality: N/A;  . PLACEMENT OF LUMBAR DRAIN N/A 05/16/2019   Procedure: PLACEMENT OF LUMBAR DRAIN;  Surgeon: Newman Pies, MD;  Location: [facility];  Service: Neurosurgery;  Laterality: N/A;  PLACEMENT OF LUMBAR DRAIN  . PORT-A-CATH REMOVAL    . PORTACATH PLACEMENT Left 02/08/2014   Procedure: INSERTION PORT-A-CATH;  Surgeon: Rolm Bookbinder, MD;  Location: [place];  Service: General;  Laterality: Left;  . REPAIR OF CEREBROSPINAL FLUID LEAK N/A 04/29/2019   Procedure: Endoscopic Endonasal Cerebrospinal Fluid Leak repair, Abdominal wound revision and fat graft harvest, Intrathecal Fluorocein Injection;  Surgeon: Judith Part, MD;  Location: [facility];  Service: Neurosurgery;  Laterality: N/A;  . TONSILLECTOMY    . TRANSNASAL APPROACH N/A 04/15/2019   Procedure: TRANSNASAL APPROACH;  Surgeon: Jerrell Belfast, MD;  Location: [facility];  Service: ENT;  Laterality: N/A;  . TRANSPHENOIDAL APPROACH EXPOSURE N/A 04/15/2019   Procedure: ENDOSCOPIC TRANS NASAL APPROACH WITH FUSIONN;  Surgeon: Jerrell Belfast, MD;  Location: [facility];  Service: ENT;  Laterality: N/A;  . TRANSPHENOIDAL  APPROACH EXPOSURE N/A 04/29/2019   Procedure: Endoscopic repair of spinal fluid leak ;  Surgeon: Jerrell Belfast, MD;  Location: [place] [facility];  Service: ENT;  Laterality: N/A;  . TRANSPHENOIDAL PITUITARY RESECTION  2006  . VENTRICULOPERITONEAL SHUNT Right 05/31/2019   Procedure: SHUNT INSERTION VENTRICULAR-PERITONEAL; REMOVAL OF LUMBAR DRAIN;  Surgeon: Judith Part, MD;  Location: [facility];  Service: Neurosurgery;  Laterality: Right;  SHUNT INSERTION VENTRICULAR-PERITONEAL; REMOVAL OF LUMBAR DRAIN  . WISDOM TOOTH EXTRACTION      Vitals:   03/05/20 1106  Pulse: 78     Subjective Assessment -  03/05/20 1106    Subjective Pt reports that she has been moving a lot over the weekend.    Pertinent History 64 y.o. 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    Currently in Pain? Yes    Pain Score --   just a little   Pain Location Knee    Pain Orientation Left    Pain Descriptors / Indicators Aching    Pain Type Chronic pain    Pain Onset More than a month ago                             [facility] Adult PT Treatment/Exercise - 03/05/20 1109      Ambulation/Gait   Ambulation/Gait Yes    Ambulation/Gait Assistance 5: Supervision    Ambulation/Gait Assistance Details Pt ambulated on treadmill x 6 min at 1.0-1.67mh. HR=104 after with 6/10 RPE. Pt was given verbal cues to increase step length during gait.    Assistive device --   UE support on treadmill   Gait Pattern Step-through pattern    Ambulation Surface Level;Indoor    Ramp 5: Supervision    Ramp Details (indicate cue type and reason) Pt ambulated up ramp on blue mat    Curb 4: Min assist    Curb Details (indicate cue type and reason) Stepping down off curb min assist as caught her left foot coming down.      Neuro Re-ed    Neuro Re-ed Details  In // bars: Standing on airex feet together eyes open x 30 sec then eyes closed x 30 sec, staggered stance on airex x 30 sec each position then adding in head turn left/right x 10 each.  Steps ups on foam beam x 5 with each foot then stepping off forwards x 5 each foot. Pt had 1 UE support with step-ups and was instructed to get balance each time. Standing on blue mat on ramp facing uphill x 30 sec getting stretch through gastroc then feet together eyes closed x 30 sec, then head turns left/right and up/down x 10, then marching in place x 20 CGA.       Exercises   Exercises Other Exercises    Other  Exercises  Side stepping down hallway and back 40' each direction. Pt reported just a little discomfort in left ankle.                     PT Short Term Goals - 02/29/20 2105      PT SHORT TERM GOAL #1   Title Pt will be independent with initial HEP for strength, balance and walking program.    Baseline 02/28/20: met with current program (pt reports  they are still challenging)    Status Achieved    Target Date 03/01/20      PT SHORT TERM GOAL #2   Title Pt will increase gait speed to >0.28ms for improved gait safety.    Baseline 02/28/20: 1.09 m/s no AD    Status Achieved    Target Date 03/01/20      PT SHORT TERM GOAL #3   Title Pt wil decreased 5 x sit to stand from 17.2 sec to <15 sec for improved balance and functional strength.    Baseline 02/28/20: 13.06 sec's no UE support from standard height chair    Status Achieved    Target Date 03/01/20      PT SHORT TERM GOAL #4   Title FGA and 6 min walk will be assessed and LTGs written to further assess balance and gait.    Status Achieved    Target Date 03/01/20             PT Long Term Goals - 02/02/20 1126      PT LONG TERM GOAL #1   Title Pt will be independent with progressive HEP for strength, balance and gait to continue gains on own.    Time 8    Period Weeks    Status New      PT LONG TERM GOAL #2   Title Pt will ambulate > 800' on varied surfaces independently with scanning environment for improved gait safety in community.    Time 8    Period Weeks    Status New      PT LONG TERM GOAL #3   Title Pt will increase FGA from 19/30 to 24 or more for improved balance and gait safety.    Time 8    Period Weeks    Status New      PT LONG TERM GOAL #4   Title Pt will increase 6 min walk to >950' without rest break for improved activity tolerance and community mobilty.    Baseline 805' with 1 standing rest break.    Time 8    Period Weeks    Status New                 Plan - 03/05/20 1201     Clinical Impression Statement Pt continues to tolerate more activities with less breaks. She is tolerating less support on compliant surfaces.    Personal Factors and Comorbidities Comorbidity 3+    Comorbidities T2DM, breast cancer, pituitary tumor s/p resection in 08/2005, bacterial meningitis, VP shunt placement due to hydrocephalus, CSF leak, and ventriculitis    Examination-Activity Limitations Locomotion Level    Examination-Participation Restrictions Community Activity;Driving    Stability/Clinical Decision Making Evolving/Moderate complexity    Rehab Potential Good    PT Frequency 2x / week    PT Duration 8 weeks    PT Treatment/Interventions ADLs/Self Care Home Management;Cryotherapy;Moist Heat;Therapeutic activities;Therapeutic exercise;Balance training;Neuromuscular re-education;Manual techniques;Passive range of motion;Patient/family education;Vestibular    PT Next Visit Plan Work on  curbs next visit as she has some issues due to visual deficits. Gait with scanning environment. Continue with balance progression and hip strengthening. Aerobic exercise. Monitor HR. Has left visual field deficits.    Consulted and Agree with Plan of Care Patient           Patient will benefit from skilled therapeutic intervention in order to improve the following deficits and impairments:  Abnormal gait, Decreased activity tolerance, Decreased balance, Impaired vision/preception, Decreased strength  Visit Diagnosis: Other abnormalities of gait and mobility  Muscle weakness (generalized)     Problem List Patient Active Problem List   Diagnosis Date Noted  . Visual disturbance 11/03/2019  . Abnormality of gait 11/03/2019  . Anemia of chronic disease   . CKD (chronic kidney disease), stage II   . Controlled type 2 diabetes mellitus with hyperglycemia, without long-term current use of insulin ([place])   . Benign essential HTN   . 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 Communicating  hydrocephalus ([place])   . S/P VP shunt   . Diabetes mellitus type 2 in obese ([place])   . Acute blood loss anemia   . Elevated BUN 05/12/2019  . Elevated serum creatinine 05/12/2019  . Pituitary tumor 05/12/2019  . Vitamin D deficiency 05/12/2019  . DJD (degenerative joint disease) 05/12/2019  . 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 Febrile illness   . AKI (acute kidney injury) ([place]) 10/30/2018  . Pituitary adenoma ([place]) 10/30/2018  . Acute bacterial meningitis 10/30/2018  . Sepsis ([place]) 10/30/2018  . Headache 10/29/2018  . Acute encephalopathy 10/29/2018  . Rhabdomyolysis 10/29/2018  . History of pituitary adenoma 10/29/2018  . Glaucoma 10/29/2018  . Atherosclerosis 10/29/2018  . Acute purulent meningitis 10/29/2018  . CKD (chronic kidney disease), stage III 10/29/2018  . Meningitis 10/29/2018  . Fever 10/29/2018  . Malignant neoplasm of lower-outer quadrant of right breast of female, estrogen receptor negative ([place]) 06/09/2016  . Morbid obesity with BMI of 40.0-44.9, adult ([place]) 05/25/2015  . Type II diabetes mellitus, uncontrolled ([place]) 05/25/2015  . Normocytic anemia 04/27/2014  . Hyperlipidemia, unspecified 10/22/2011  . Essential hypertension with goal blood pressure less than 130/85 10/22/2011  . DM (diabetes mellitus) ([place]) 10/22/2011  . HTN (hypertension)   . Diabetes mellitus type II, non insulin dependent ([place])   . Dyslipidemia     Electa Sniff, PT, DPT, NCS 03/05/2020, 12:05 PM  [facility] [address] [place], [place], [address] Phone: [phone]   Fax:  [phone]  Name: Terrian Ridlon MRN: [id number] Date of Birth: [birth year]

## 2020-03-07 ENCOUNTER — Other Ambulatory Visit: Payer: Self-pay

## 2020-03-07 ENCOUNTER — Encounter: Payer: Self-pay | Admitting: Physical Therapy

## 2020-03-07 ENCOUNTER — Ambulatory Visit: Payer: BC Managed Care – PPO | Admitting: Physical Therapy

## 2020-03-07 DIAGNOSIS — M6281 Muscle weakness (generalized): Secondary | ICD-10-CM

## 2020-03-07 DIAGNOSIS — R2689 Other abnormalities of gait and mobility: Secondary | ICD-10-CM | POA: Diagnosis not present

## 2020-03-07 NOTE — Therapy (Signed)
[facility] [address] [place], [place], [address] Phone: [phone]   Fax:  [phone]  Physical Therapy Treatment  Patient Details  Name: [name] MRN: [id number] Date of Birth: 05-26-1956 Referring Provider (PT): Delice Lesch   Encounter Date: 03/07/2020   PT End of Session - 03/07/20 0808    Visit Number 12    Number of Visits 17    Date for PT Re-Evaluation 03/31/20    PT Start Time 0802    PT Stop Time 0845    PT Time Calculation (min) 43 min    Equipment Utilized During Treatment Gait belt    Activity Tolerance Patient tolerated treatment well;Patient limited by pain    Behavior During Therapy [facility] for tasks assessed/performed           Past Medical History:  Diagnosis Date  . Anemia   . Breast cancer ([place]) 12/29/13   right 6:00 o'clock, lower outer  . Cataract of left eye   . Diabetes mellitus    type 2  . Family history of malignant neoplasm of breast   . Glaucoma    MD just watching, no eye drops  . High cholesterol   . History of blood transfusion 2015   with chemo treatments  . History of radiation therapy 08/12/14- 10/11/14   right breast /50.4 Gy/28 fx, right breast boost/ 10 Gy/ 5 fx  . HTN (hypertension)   . Personal history of chemotherapy   . Personal history of radiation therapy   . Pituitary adenoma ([place]) 10/30/2018  . Wears dentures    full top-partial bottom  . Wears dentures    full  . Wears glasses   . Wears glasses     Past Surgical History:  Procedure Laterality Date  . ABDOMINAL HYSTERECTOMY  1993   endometriosis  . BREAST BIOPSY Left 12/29/2013  . BREAST LUMPECTOMY Right 02/08/2014  . BREAST LUMPECTOMY WITH AXILLARY LYMPH NODE BIOPSY Right 02/08/14   invasive ductal  . CRANIOTOMY N/A 04/15/2019   Procedure: Transphenoid resection of pituitary tumor with fat graft;  Surgeon: Judith Part, MD;  Location: [place];  Service: Neurosurgery;  Laterality: N/A;   . IR IMAGE GUIDED FLUID DRAIN BY CATHETER  04/19/2019  . MULTIPLE TOOTH EXTRACTIONS    . PLACEMENT OF LUMBAR DRAIN N/A 04/24/2019   Procedure: PLACEMENT OF LUMBAR DRAIN;  Surgeon: Eustace Moore, MD;  Location: [place];  Service: Neurosurgery;  Laterality: N/A;  . PLACEMENT OF LUMBAR DRAIN N/A 05/16/2019   Procedure: PLACEMENT OF LUMBAR DRAIN;  Surgeon: Newman Pies, MD;  Location: [place] [facility];  Service: Neurosurgery;  Laterality: N/A;  PLACEMENT OF LUMBAR DRAIN  . PORT-A-CATH REMOVAL    . PORTACATH PLACEMENT Left 02/08/2014   Procedure: INSERTION PORT-A-CATH;  Surgeon: Rolm Bookbinder, MD;  Location: [place];  Service: General;  Laterality: Left;  . REPAIR OF CEREBROSPINAL FLUID LEAK N/A 04/29/2019   Procedure: Endoscopic Endonasal Cerebrospinal Fluid Leak repair, Abdominal wound revision and fat graft harvest, Intrathecal Fluorocein Injection;  Surgeon: Judith Part, MD;  Location: [place];  Service: Neurosurgery;  Laterality: N/A;  . TONSILLECTOMY    . TRANSNASAL APPROACH N/A 04/15/2019   Procedure: TRANSNASAL APPROACH;  Surgeon: Jerrell Belfast, MD;  Location: [place];  Service: ENT;  Laterality: N/A;  . TRANSPHENOIDAL APPROACH EXPOSURE N/A 04/15/2019   Procedure: ENDOSCOPIC TRANS NASAL APPROACH WITH FUSIONN;  Surgeon: Jerrell Belfast, MD;  Location: [place] [facility];  Service: ENT;  Laterality: N/A;  .  TRANSPHENOIDAL APPROACH EXPOSURE N/A 04/29/2019   Procedure: Endoscopic repair of spinal fluid leak ;  Surgeon: Jerrell Belfast, MD;  Location: [place];  Service: ENT;  Laterality: N/A;  . TRANSPHENOIDAL PITUITARY RESECTION  2006  . VENTRICULOPERITONEAL SHUNT Right 05/31/2019   Procedure: SHUNT INSERTION VENTRICULAR-PERITONEAL; REMOVAL OF LUMBAR DRAIN;  Surgeon: Judith Part, MD;  Location: [place];  Service: Neurosurgery;  Laterality: Right;  SHUNT INSERTION VENTRICULAR-PERITONEAL; REMOVAL OF LUMBAR DRAIN  . WISDOM TOOTH EXTRACTION      There were no vitals filed for this visit.    Subjective Assessment - 03/07/20 0805    Subjective No new falls. Pt. complains of some pain in her left knee.    Pertinent History 64 y.o. 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    Currently in Pain? Yes    Pain Score 5     Pain Location Knee    Pain Orientation Left    Pain Descriptors / Indicators Aching    Pain Type Chronic pain    Pain Onset More than a month ago    Pain Frequency Intermittent                [facility] Adult PT Treatment/Exercise - 03/07/20 0806      Ambulation/Gait   Ambulation/Gait Yes    Ambulation/Gait Assistance 4: Min guard    Ambulation Distance (Feet) 1000 Feet    Assistive device None    Gait Pattern Step-through pattern;Poor foot clearance - left;Poor foot clearance - right    Ambulation Surface Outdoor;Paved;Unlevel    Gait Comments Gait was performed outside on a paved sidewalk for the purpose of improving the pt's activity tolerance. HR 80 bpm prior to gait outdoors; 116 bpm after gait. Pt. had to pause halfway in order to rest her knee secondary to increased pain. Pt. was min. guard due to having 2 episodes of LOB caused by staggering and poor foot clearance, but was able to correct herself.                Balance Exercises - 03/07/20 0824      Balance Exercises: Standing   Standing Eyes Closed Narrow base of support (BOS);Foam/compliant surface;30 secs    Standing Eyes Closed Limitations Pt. performed feet together and EC for 30 seconds x 2 on blue airex without UE support.    Tandem Stance Foam/compliant surface;Eyes open;Eyes closed;Intermittent upper extremity support;Limitations    Tandem Stance Time pt. performed tandem stance on blue airex pad for 30 seconds with EO, then again with EC with L foot fwd and then for 30 seconds with EO then EC and R foot fwd;  intermittent use of UE's was used to correct balance.    Stepping Strategy Anterior;Posterior;Limitations;10 reps    Stepping Strategy Limitations Pt. performed stepping bwd off of blue foam beam for 10 reps bil; then performed stepping fwd off of the beam for 10 reps on the R and was limited to 5 reps on the L secondary to increased knee pain. VC's were provided for pt. to ensure that foot clearance of the beam was taking place for safety.    Tandem Gait Foam/compliant surface;Limitations;Forward;Retro;Upper extremity support;3 reps    Tandem Gait Limitations Pt. used RUE for support; VC's needed for pt. to remain in the middle of the beam.  Partial Tandem Stance Eyes closed;Foam/compliant surface;Intermittent upper extremity support;30 secs    Partial Tandem Stance Limitations Pt. performed partial tandem stance on blue airex pad with eyes closed for 30 seconds x 2 with R foot fwd and again with L foot fwd.               PT Short Term Goals - 02/29/20 2105      PT SHORT TERM GOAL #1   Title Pt will be independent with initial HEP for strength, balance and walking program.    Baseline 02/28/20: met with current program (pt reports they are still challenging)    Status Achieved    Target Date 03/01/20      PT SHORT TERM GOAL #2   Title Pt will increase gait speed to >0.46ms for improved gait safety.    Baseline 02/28/20: 1.09 m/s no AD    Status Achieved    Target Date 03/01/20      PT SHORT TERM GOAL #3   Title Pt wil decreased 5 x sit to stand from 17.2 sec to <15 sec for improved balance and functional strength.    Baseline 02/28/20: 13.06 sec's no UE support from standard height chair    Status Achieved    Target Date 03/01/20      PT SHORT TERM GOAL #4   Title FGA and 6 min walk will be assessed and LTGs written to further assess balance and gait.    Status Achieved    Target Date 03/01/20             PT Long Term Goals - 02/02/20 1126      PT LONG TERM GOAL #1    Title Pt will be independent with progressive HEP for strength, balance and gait to continue gains on own.    Time 8    Period Weeks    Status New      PT LONG TERM GOAL #2   Title Pt will ambulate > 800' on varied surfaces independently with scanning environment for improved gait safety in community.    Time 8    Period Weeks    Status New      PT LONG TERM GOAL #3   Title Pt will increase FGA from 19/30 to 24 or more for improved balance and gait safety.    Time 8    Period Weeks    Status New      PT LONG TERM GOAL #4   Title Pt will increase 6 min walk to >950' without rest break for improved activity tolerance and community mobilty.    Baseline 805' with 1 standing rest break.    Time 8    Period Weeks    Status New                 Plan - 03/07/20 05188   Clinical Impression Statement Pt. continued to show improvements in activity tolerance today as shown by her ability to ambulate outdoors with only one minor rest break seconday to increased L knee pain. Pt. was also limited during increased WB balance activities on the LLE due to pain. Patient was able to continue progressing through balance activities today and performed several activities without UE support. Pt. will benefit from further PT in order to continue progressing toward unmet goals.    Personal Factors and Comorbidities Comorbidity 3+    Comorbidities T2DM, breast cancer, pituitary tumor s/p resection in 08/2005, bacterial meningitis, VP shunt placement due  to hydrocephalus, CSF leak, and ventriculitis    Examination-Activity Limitations Locomotion Level    Examination-Participation Restrictions Community Activity;Driving    Stability/Clinical Decision Making Evolving/Moderate complexity    Rehab Potential Good    PT Frequency 2x / week    PT Duration 8 weeks    PT Treatment/Interventions ADLs/Self Care Home Management;Cryotherapy;Moist Heat;Therapeutic activities;Therapeutic exercise;Balance  training;Neuromuscular re-education;Manual techniques;Passive range of motion;Patient/family education;Vestibular    PT Next Visit Plan Continue to progress pt. through balance and aerobis exercise to increase exercise tolerance.    Consulted and Agree with Plan of Care Patient           Patient will benefit from skilled therapeutic intervention in order to improve the following deficits and impairments:  Abnormal gait, Decreased activity tolerance, Decreased balance, Impaired vision/preception, Decreased strength  Visit Diagnosis: Other abnormalities of gait and mobility  Muscle weakness (generalized)     Problem List Patient Active Problem List   Diagnosis Date Noted  . Visual disturbance 11/03/2019  . Abnormality of gait 11/03/2019  . Anemia of chronic disease   . CKD (chronic kidney disease), stage II   . Controlled type 2 diabetes mellitus with hyperglycemia, without long-term current use of insulin ([place])   . Benign essential HTN   . 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 S/P VP shunt   . Diabetes mellitus type 2 in obese ([place])   . Acute blood loss anemia   . Elevated BUN 05/12/2019  . Elevated serum creatinine 05/12/2019  . Pituitary tumor 05/12/2019  . Vitamin D deficiency 05/12/2019  . DJD (degenerative joint disease) 05/12/2019  . 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 Febrile illness   . AKI (acute kidney injury) ([place]) 10/30/2018  . Pituitary adenoma ([place]) 10/30/2018  . Acute bacterial meningitis 10/30/2018  . Sepsis ([place]) 10/30/2018  . Headache 10/29/2018  . Acute encephalopathy 10/29/2018  . Rhabdomyolysis 10/29/2018  . History of pituitary adenoma 10/29/2018  . Glaucoma 10/29/2018  . Atherosclerosis 10/29/2018  . Acute purulent meningitis 10/29/2018  . CKD (chronic kidney disease), stage III 10/29/2018  . Meningitis 10/29/2018  . Fever 10/29/2018  . Malignant neoplasm of lower-outer quadrant  of right breast of female, estrogen receptor negative ([place]) 06/09/2016  . Morbid obesity with BMI of 40.0-44.9, adult ([place]) 05/25/2015  . Type II diabetes mellitus, uncontrolled ([place]) 05/25/2015  . Normocytic anemia 04/27/2014  . Hyperlipidemia, unspecified 10/22/2011  . Essential hypertension with goal blood pressure less than 130/85 10/22/2011  . DM (diabetes mellitus) ([place]) 10/22/2011  . HTN (hypertension)   . Diabetes mellitus type II, non insulin dependent ([place])   . Dyslipidemia     Royann Shivers, SPTA 03/07/2020, 9:13 AM  [facility] [address] [place], [place], [address] Phone: [phone]   Fax:  [phone]  Name: [name] MRN: [id number] Date of Birth: 05/21/1956  This note has been reviewed and edited by supervising CI.  Willow Ora, PTA, [facility] [address], [place], [place] [address] [phone] 03/07/20, 9:48 AM

## 2020-03-08 ENCOUNTER — Ambulatory Visit (HOSPITAL_COMMUNITY)
Admission: RE | Admit: 2020-03-08 | Discharge: 2020-03-08 | Disposition: A | Discharge status: Home / Self Care | Payer: BC Managed Care – PPO | Source: Ambulatory Visit | Attending: Neurological Surgery | Admitting: Neurological Surgery

## 2020-03-08 ENCOUNTER — Ambulatory Visit: Payer: BC Managed Care – PPO

## 2020-03-08 DIAGNOSIS — D352 Benign neoplasm of pituitary gland: Secondary | ICD-10-CM | POA: Diagnosis not present

## 2020-03-08 MED ORDER — GADOBUTROL 1 MMOL/ML IV SOLN
7.0000 mL | Freq: Once | INTRAVENOUS | Status: AC | PRN
  Administered 2020-03-08: 7 mL via INTRAVENOUS

## 2020-03-13 ENCOUNTER — Encounter: Payer: Self-pay | Admitting: Physical Therapy

## 2020-03-13 ENCOUNTER — Ambulatory Visit: Payer: BC Managed Care – PPO | Attending: Physical Medicine & Rehabilitation | Admitting: Physical Therapy

## 2020-03-13 ENCOUNTER — Other Ambulatory Visit: Payer: Self-pay

## 2020-03-13 VITALS — HR 83

## 2020-03-13 DIAGNOSIS — M6281 Muscle weakness (generalized): Secondary | ICD-10-CM | POA: Insufficient documentation

## 2020-03-13 DIAGNOSIS — R2689 Other abnormalities of gait and mobility: Secondary | ICD-10-CM | POA: Diagnosis present

## 2020-03-13 DIAGNOSIS — R41840 Attention and concentration deficit: Secondary | ICD-10-CM | POA: Diagnosis present

## 2020-03-13 NOTE — Therapy (Signed)
[facility] [address] [place], [place], [address] Phone: [phone]   Fax:  [phone]  Physical Therapy Treatment  Patient Details  Name: [name] MRN: [id number] Date of Birth: [birth year] Referring Provider (PT): Delice Lesch   Encounter Date: 03/13/2020   PT End of Session - 03/13/20 1104    Visit Number 13    Number of Visits 17    Date for PT Re-Evaluation 03/31/20    PT Start Time 1100    PT Stop Time 1145    PT Time Calculation (min) 45 min    Equipment Utilized During Treatment Gait belt    Activity Tolerance Patient tolerated treatment well;Patient limited by pain    Behavior During Therapy [facility] for tasks assessed/performed           Past Medical History:  Diagnosis Date  . Anemia   . Breast cancer ([place]) 12/29/13   right 6:00 o'clock, lower outer  . Cataract of left eye   . Diabetes mellitus    type 2  . Family history of malignant neoplasm of breast   . Glaucoma    MD just watching, no eye drops  . High cholesterol   . History of blood transfusion 2015   with chemo treatments  . History of radiation therapy 08/12/14- 10/11/14   right breast /50.4 Gy/28 fx, right breast boost/ 10 Gy/ 5 fx  . HTN (hypertension)   . Personal history of chemotherapy   . Personal history of radiation therapy   . Pituitary adenoma ([place]) 10/30/2018  . Wears dentures    full top-partial bottom  . Wears dentures    full  . Wears glasses   . Wears glasses     Past Surgical History:  Procedure Laterality Date  . ABDOMINAL HYSTERECTOMY  1993   endometriosis  . BREAST BIOPSY Left 12/29/2013  . BREAST LUMPECTOMY Right 02/08/2014  . BREAST LUMPECTOMY WITH AXILLARY LYMPH NODE BIOPSY Right 02/08/14   invasive ductal  . CRANIOTOMY N/A 04/15/2019   Procedure: Transphenoid resection of pituitary tumor with fat graft;  Surgeon: Judith Part, MD;  Location: [facility];  Service: Neurosurgery;  Laterality: N/A;   . IR IMAGE GUIDED FLUID DRAIN BY CATHETER  04/19/2019  . MULTIPLE TOOTH EXTRACTIONS    . PLACEMENT OF LUMBAR DRAIN N/A 04/24/2019   Procedure: PLACEMENT OF LUMBAR DRAIN;  Surgeon: Eustace Moore, MD;  Location: [facility];  Service: Neurosurgery;  Laterality: N/A;  . PLACEMENT OF LUMBAR DRAIN N/A 05/16/2019   Procedure: PLACEMENT OF LUMBAR DRAIN;  Surgeon: Newman Pies, MD;  Location: [facility];  Service: Neurosurgery;  Laterality: N/A;  PLACEMENT OF LUMBAR DRAIN  . PORT-A-CATH REMOVAL    . PORTACATH PLACEMENT Left 02/08/2014   Procedure: INSERTION PORT-A-CATH;  Surgeon: Rolm Bookbinder, MD;  Location: [facility];  Service: General;  Laterality: Left;  . REPAIR OF CEREBROSPINAL FLUID LEAK N/A 04/29/2019   Procedure: Endoscopic Endonasal Cerebrospinal Fluid Leak repair, Abdominal wound revision and fat graft harvest, Intrathecal Fluorocein Injection;  Surgeon: Judith Part, MD;  Location: [facility];  Service: Neurosurgery;  Laterality: N/A;  . TONSILLECTOMY    . TRANSNASAL APPROACH N/A 04/15/2019   Procedure: TRANSNASAL APPROACH;  Surgeon: Jerrell Belfast, MD;  Location: [facility];  Service: ENT;  Laterality: N/A;  . TRANSPHENOIDAL APPROACH EXPOSURE N/A 04/15/2019   Procedure: ENDOSCOPIC TRANS NASAL APPROACH WITH FUSIONN;  Surgeon: Jerrell Belfast, MD;  Location: [facility];  Service: ENT;  Laterality: N/A;  .  TRANSPHENOIDAL APPROACH EXPOSURE N/A 04/29/2019   Procedure: Endoscopic repair of spinal fluid leak ;  Surgeon: Jerrell Belfast, MD;  Location: [facility];  Service: ENT;  Laterality: N/A;  . TRANSPHENOIDAL PITUITARY RESECTION  2006  . VENTRICULOPERITONEAL SHUNT Right 05/31/2019   Procedure: SHUNT INSERTION VENTRICULAR-PERITONEAL; REMOVAL OF LUMBAR DRAIN;  Surgeon: Judith Part, MD;  Location: [facility];  Service: Neurosurgery;  Laterality: Right;  SHUNT INSERTION VENTRICULAR-PERITONEAL; REMOVAL OF LUMBAR DRAIN  . WISDOM TOOTH EXTRACTION      Vitals:   03/13/20 1103  Pulse: 83      Subjective Assessment - 03/13/20 1103    Subjective Pt. has pain in her L knee today after a busy weekend. No new falls.    Pertinent History 64 y.o. 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    Currently in Pain? Yes    Pain Score 7     Pain Location Knee    Pain Orientation Left    Pain Descriptors / Indicators Aching    Pain Type Chronic pain    Pain Onset More than a month ago    Pain Frequency Intermittent    Aggravating Factors  When beginning to walk    Pain Relieving Factors Rest    Multiple Pain Sites No             [facility] Adult PT Treatment/Exercise - 03/13/20 1105      Ambulation/Gait   Ambulation/Gait Yes    Ambulation/Gait Assistance 5: Supervision    Ambulation/Gait Assistance Details Pt. required supervision due to unlevel outdoor surface; decreased stance time on left secondary to pain. HR increased to 107 bpm after ambulation    Ambulation Distance (Feet) 500 Feet    Assistive device None    Gait Pattern Step-through pattern;Decreased arm swing - right;Decreased arm swing - left;Decreased stance time - left    Ambulation Surface Unlevel;Outdoor;Paved               Balance Exercises - 03/13/20 1121      Balance Exercises: Standing   Standing Eyes Closed Foam/compliant surface;4 reps;30 secs;Limitations    Standing Eyes Closed Limitations performed on double 1 in. foam in partial tandem stance; EC and intermittent UE support.    Rockerboard Anterior/posterior;Lateral;Head turns;EO;EC;30 seconds;Intermittent UE support;Limitations    Rockerboard Limitations pt. performed lateral and anterior/posterior rocking for 20 reps ea with EO and no UE support; pt. then performed 2 sets of 30 seconds with static hold and EC with no UE use; then 4 sets of 30 seconds of static hold with EC and left  to right head turns; intermittent UE use     Balance Beam Pt. performed sidestepping and fwd and bwd tandem walking on blue foam beam x3 laps ea at the countertop; pt. used intermittent UE support with sidestepping and L UE support with tandem walking; VC's needed for pt. to place feet in the center of the foam with tandem walking to help maintain balance.     Partial Tandem Stance Eyes open;Foam/compliant surface;4 reps;30 secs;Limitations    Partial Tandem Stance Limitations Pt. performed 2 reps in partial tandem stance on double 1 in. foam with R foot fwd, then with L foot fwd; No UE use; at countertop with head turns left to right  PT Short Term Goals - 02/29/20 2105      PT SHORT TERM GOAL #1   Title Pt will be independent with initial HEP for strength, balance and walking program.    Baseline 02/28/20: met with current program (pt reports they are still challenging)    Status Achieved    Target Date 03/01/20      PT SHORT TERM GOAL #2   Title Pt will increase gait speed to >0.81ms for improved gait safety.    Baseline 02/28/20: 1.09 m/s no AD    Status Achieved    Target Date 03/01/20      PT SHORT TERM GOAL #3   Title Pt wil decreased 5 x sit to stand from 17.2 sec to <15 sec for improved balance and functional strength.    Baseline 02/28/20: 13.06 sec's no UE support from standard height chair    Status Achieved    Target Date 03/01/20      PT SHORT TERM GOAL #4   Title FGA and 6 min walk will be assessed and LTGs written to further assess balance and gait.    Status Achieved    Target Date 03/01/20             PT Long Term Goals - 03/13/20 1208      PT LONG TERM GOAL #1   Title Pt will be independent with progressive HEP for strength, balance and gait to continue gains on own. ( all LTGs due 03/31/20)    Time 8    Period Weeks    Status New      PT LONG TERM GOAL #2   Title Pt will ambulate > 800' on varied surfaces independently with scanning  environment for improved gait safety in community.    Time 8    Period Weeks    Status New      PT LONG TERM GOAL #3   Title Pt will increase FGA from 19/30 to 24 or more for improved balance and gait safety.    Time 8    Period Weeks    Status New      PT LONG TERM GOAL #4   Title Pt will increase 6 min walk to >950' without rest break for improved activity tolerance and community mobilty.    Baseline 805' with 1 standing rest break.    Time 8    Period Weeks    Status New                 Plan - 03/13/20 1208    Clinical Impression Statement Pt. was limited by pain of the L knee today which resulted in a shortened distance for ambulation. Pt. was able to perform all balance exercises, but with rest breaks secondary to pain. [facility] turns and tolerated this well. Pt. will benefit from further PT in order to continue progressing her balance and activity tolerance; will progress as L knee allows.    Personal Factors and Comorbidities Comorbidity 3+    Comorbidities T2DM, breast cancer, pituitary tumor s/p resection in 08/2005, bacterial meningitis, VP shunt placement due to hydrocephalus, CSF leak, and ventriculitis    Examination-Activity Limitations Locomotion Level    Examination-Participation Restrictions Community Activity;Driving    Stability/Clinical Decision Making Evolving/Moderate complexity    Rehab Potential Good    PT Frequency 2x / week    PT Duration 8 weeks    PT Treatment/Interventions ADLs/Self Care Home Management;Cryotherapy;Moist Heat;Therapeutic activities;Therapeutic exercise;Balance  training;Neuromuscular re-education;Manual techniques;Passive range of motion;Patient/family education;Vestibular    PT Next Visit Plan Continue progressing patient through gait distance and balance activities as L knee pain allows.    Consulted and Agree with Plan of Care Patient           Patient will benefit from skilled therapeutic  intervention in order to improve the following deficits and impairments:  Abnormal gait, Decreased activity tolerance, Decreased balance, Impaired vision/preception, Decreased strength  Visit Diagnosis: Other abnormalities of gait and mobility  Muscle weakness (generalized)     Problem List Patient Active Problem List   Diagnosis Date Noted  . Visual disturbance 11/03/2019  . Abnormality of gait 11/03/2019  . Anemia of chronic disease   . CKD (chronic kidney disease), stage II   . Controlled type 2 diabetes mellitus with hyperglycemia, without long-term current use of insulin ([place])   . Benign essential HTN   . 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 S/P VP shunt   . Diabetes mellitus type 2 in obese ([place])   . Acute blood loss anemia   . Elevated BUN 05/12/2019  . Elevated serum creatinine 05/12/2019  . Pituitary tumor 05/12/2019  . Vitamin D deficiency 05/12/2019  . DJD (degenerative joint disease) 05/12/2019  . 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 Febrile illness   . AKI (acute kidney injury) ([place]) 10/30/2018  . Pituitary adenoma ([place]) 10/30/2018  . Acute bacterial meningitis 10/30/2018  . Sepsis ([place]) 10/30/2018  . Headache 10/29/2018  . Acute encephalopathy 10/29/2018  . Rhabdomyolysis 10/29/2018  . History of pituitary adenoma 10/29/2018  . Glaucoma 10/29/2018  . Atherosclerosis 10/29/2018  . Acute purulent meningitis 10/29/2018  . CKD (chronic kidney disease), stage III 10/29/2018  . Meningitis 10/29/2018  . Fever 10/29/2018  . Malignant neoplasm of lower-outer quadrant of right breast of female, estrogen receptor negative ([place]) 06/09/2016  . Morbid obesity with BMI of 40.0-44.9, adult ([place]) 05/25/2015  . Type II diabetes mellitus, uncontrolled ([place]) 05/25/2015  . Normocytic anemia 04/27/2014  . Hyperlipidemia, unspecified 10/22/2011  . Essential hypertension with goal blood pressure less than 130/85  10/22/2011  . DM (diabetes mellitus) ([place]) 10/22/2011  . HTN (hypertension)   . Diabetes mellitus type II, non insulin dependent ([place])   . Dyslipidemia     Royann Shivers, SPTA 03/14/2020, 11:53 AM  [facility] [address] [place], [place], [address] Phone: [phone]   Fax:  [phone]  Name: [name] MRN: [id number] Date of Birth: [birth year]  This note has been reviewed and edited by supervising CI.  Willow Ora, PTA, [facility] [address] [place], [place] [address] [phone] 03/14/20, 12:03 PM

## 2020-03-15 ENCOUNTER — Encounter: Payer: Self-pay | Admitting: Physical Therapy

## 2020-03-15 ENCOUNTER — Ambulatory Visit: Payer: BC Managed Care – PPO | Admitting: Physical Therapy

## 2020-03-15 ENCOUNTER — Other Ambulatory Visit: Payer: Self-pay

## 2020-03-15 VITALS — HR 107

## 2020-03-15 DIAGNOSIS — R2689 Other abnormalities of gait and mobility: Secondary | ICD-10-CM

## 2020-03-15 DIAGNOSIS — M6281 Muscle weakness (generalized): Secondary | ICD-10-CM

## 2020-03-15 NOTE — Therapy (Signed)
[facility] [address] [place], [place], [address] Phone: [phone]   Fax:  [phone]  Physical Therapy Treatment  Patient Details  Name: [name] MRN: [id number] Date of Birth: 25-Jan-1956 Referring Provider (PT): Delice Lesch   Encounter Date: 03/15/2020   PT End of Session - 03/15/20 1116    Visit Number 14    Number of Visits 17    Date for PT Re-Evaluation 03/31/20    PT Start Time 1102    PT Stop Time 1146    PT Time Calculation (min) 44 min    Equipment Utilized During Treatment Gait belt    Activity Tolerance Patient tolerated treatment well;Patient limited by pain    Behavior During Therapy [facility] for tasks assessed/performed           Past Medical History:  Diagnosis Date  . Anemia   . Breast cancer ([place]) 12/29/13   right 6:00 o'clock, lower outer  . Cataract of left eye   . Diabetes mellitus    type 2  . Family history of malignant neoplasm of breast   . Glaucoma    MD just watching, no eye drops  . High cholesterol   . History of blood transfusion 2015   with chemo treatments  . History of radiation therapy 08/12/14- 10/11/14   right breast /50.4 Gy/28 fx, right breast boost/ 10 Gy/ 5 fx  . HTN (hypertension)   . Personal history of chemotherapy   . Personal history of radiation therapy   . Pituitary adenoma ([place]) 10/30/2018  . Wears dentures    full top-partial bottom  . Wears dentures    full  . Wears glasses   . Wears glasses     Past Surgical History:  Procedure Laterality Date  . ABDOMINAL HYSTERECTOMY  1993   endometriosis  . BREAST BIOPSY Left 12/29/2013  . BREAST LUMPECTOMY Right 02/08/2014  . BREAST LUMPECTOMY WITH AXILLARY LYMPH NODE BIOPSY Right 02/08/14   invasive ductal  . CRANIOTOMY N/A 04/15/2019   Procedure: Transphenoid resection of pituitary tumor with fat graft;  Surgeon: Judith Part, MD;  Location: [facility];  Service: Neurosurgery;  Laterality: N/A;   . IR IMAGE GUIDED FLUID DRAIN BY CATHETER  04/19/2019  . MULTIPLE TOOTH EXTRACTIONS    . PLACEMENT OF LUMBAR DRAIN N/A 04/24/2019   Procedure: PLACEMENT OF LUMBAR DRAIN;  Surgeon: Eustace Moore, MD;  Location: [facility];  Service: Neurosurgery;  Laterality: N/A;  . PLACEMENT OF LUMBAR DRAIN N/A 05/16/2019   Procedure: PLACEMENT OF LUMBAR DRAIN;  Surgeon: Newman Pies, MD;  Location: [facility];  Service: Neurosurgery;  Laterality: N/A;  PLACEMENT OF LUMBAR DRAIN  . PORT-A-CATH REMOVAL    . PORTACATH PLACEMENT Left 02/08/2014   Procedure: INSERTION PORT-A-CATH;  Surgeon: Rolm Bookbinder, MD;  Location: [facility];  Service: General;  Laterality: Left;  . REPAIR OF CEREBROSPINAL FLUID LEAK N/A 04/29/2019   Procedure: Endoscopic Endonasal Cerebrospinal Fluid Leak repair, Abdominal wound revision and fat graft harvest, Intrathecal Fluorocein Injection;  Surgeon: Judith Part, MD;  Location: [facility];  Service: Neurosurgery;  Laterality: N/A;  . TONSILLECTOMY    . TRANSNASAL APPROACH N/A 04/15/2019   Procedure: TRANSNASAL APPROACH;  Surgeon: Jerrell Belfast, MD;  Location: [facility];  Service: ENT;  Laterality: N/A;  . TRANSPHENOIDAL APPROACH EXPOSURE N/A 04/15/2019   Procedure: ENDOSCOPIC TRANS NASAL APPROACH WITH FUSIONN;  Surgeon: Jerrell Belfast, MD;  Location: [facility];  Service: ENT;  Laterality: N/A;  .  TRANSPHENOIDAL APPROACH EXPOSURE N/A 04/29/2019   Procedure: Endoscopic repair of spinal fluid leak ;  Surgeon: Jerrell Belfast, MD;  Location: [facility];  Service: ENT;  Laterality: N/A;  . TRANSPHENOIDAL PITUITARY RESECTION  2006  . VENTRICULOPERITONEAL SHUNT Right 05/31/2019   Procedure: SHUNT INSERTION VENTRICULAR-PERITONEAL; REMOVAL OF LUMBAR DRAIN;  Surgeon: Judith Part, MD;  Location: [facility];  Service: Neurosurgery;  Laterality: Right;  SHUNT INSERTION VENTRICULAR-PERITONEAL; REMOVAL OF LUMBAR DRAIN  . WISDOM TOOTH EXTRACTION      Vitals:   03/15/20 1104  Pulse: (!) 107      Subjective Assessment - 03/15/20 1104    Subjective Pt. has complaints of L knee pain today, 8/10. No new falls and no changes since last visit.    Pertinent History 64 y.o. 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    Currently in Pain? Yes    Pain Score 8     Pain Location Knee    Pain Orientation Left    Pain Descriptors / Indicators Nagging;Aching;Dull    Pain Type Chronic pain    Pain Onset More than a month ago    Aggravating Factors  Rain, begining to move    Pain Relieving Factors Rest    Multiple Pain Sites No             [facility] Adult PT Treatment/Exercise - 03/15/20 1106      Ambulation/Gait   Ambulation/Gait Yes    Ambulation/Gait Assistance 5: Supervision    Assistive device None    Gait Pattern Step-through pattern    Ambulation Surface Indoor    Gait Comments Pt. performed gait on treadmill today for aerobic training to increase actvity tolerance; 6 minutes; speed at 1.0 mph; HR up to 107 BPM during aerobic exercise.             Balance Exercises - 03/15/20 1118      Balance Exercises: Standing   Standing Eyes Closed Narrow base of support (BOS);Wide (BOA);Foam/compliant surface;3 reps;30 secs    Standing Eyes Closed Limitations Pt. performed EC with wide BOS on airex placed on the ramp for 3 reps; pt. then progressed to narrow BOS with EC for 30 seconds x3    Rockerboard EC;EO;Limitations;30 seconds;Intermittent UE support    Rockerboard Limitations pt. performed static hold for 30 seconds x3 with EO and no UE support; and then progressed to static hold for 30 seconds x3 with EC and intermitent UE support.    Balance Beam Pt. performed fwd and bwd walking on blue foam beam for 3 laps in the parallel bars; intermittent UE use; facing mirror so pt. was aware of foot placement in the middle  of the beam; pt. also performed sidestepping on foam beam for 3 laps with intermittent UE use.             PT Short Term Goals - 02/29/20 2105      PT SHORT TERM GOAL #1   Title Pt will be independent with initial HEP for strength, balance and walking program.    Baseline 02/28/20: met with current program (pt reports they are still challenging)    Status Achieved    Target Date 03/01/20      PT SHORT TERM GOAL #2   Title Pt will increase gait speed to >0.29ms for improved gait safety.  Baseline 02/28/20: 1.09 m/s no AD    Status Achieved    Target Date 03/01/20      PT SHORT TERM GOAL #3   Title Pt wil decreased 5 x sit to stand from 17.2 sec to <15 sec for improved balance and functional strength.    Baseline 02/28/20: 13.06 sec's no UE support from standard height chair    Status Achieved    Target Date 03/01/20      PT SHORT TERM GOAL #4   Title FGA and 6 min walk will be assessed and LTGs written to further assess balance and gait.    Status Achieved    Target Date 03/01/20             PT Long Term Goals - 03/13/20 1208      PT LONG TERM GOAL #1   Title Pt will be independent with progressive HEP for strength, balance and gait to continue gains on own. ( all LTGs due 03/31/20)    Time 8    Period Weeks    Status New      PT LONG TERM GOAL #2   Title Pt will ambulate > 800' on varied surfaces independently with scanning environment for improved gait safety in community.    Time 8    Period Weeks    Status New      PT LONG TERM GOAL #3   Title Pt will increase FGA from 19/30 to 24 or more for improved balance and gait safety.    Time 8    Period Weeks    Status New      PT LONG TERM GOAL #4   Title Pt will increase 6 min walk to >950' without rest break for improved activity tolerance and community mobilty.    Baseline 805' with 1 standing rest break.    Time 8    Period Weeks    Status New                 Plan - 03/15/20 1149    Clinical  Impression Statement Pt. continues to report increased pain in the left knee but it does not seem to increase throughout therapy sessions. Pt. continued to progress through standing balance activities today and tolerated the treadmill for aerobic training well. Pt. had an overall decrease in HR today compared to last session, with a decrease of 9 bpm during aerobic activity. Pt. will benefit form further PT in order to increase endurance/activity tolerance as well as functional standing balance.    Personal Factors and Comorbidities Comorbidity 3+    Comorbidities T2DM, breast cancer, pituitary tumor s/p resection in 08/2005, bacterial meningitis, VP shunt placement due to hydrocephalus, CSF leak, and ventriculitis    Examination-Activity Limitations Locomotion Level    Examination-Participation Restrictions Community Activity;Driving    Stability/Clinical Decision Making Evolving/Moderate complexity    Rehab Potential Good    PT Frequency 2x / week    PT Duration 8 weeks    PT Treatment/Interventions ADLs/Self Care Home Management;Cryotherapy;Moist Heat;Therapeutic activities;Therapeutic exercise;Balance training;Neuromuscular re-education;Manual techniques;Passive range of motion;Patient/family education;Vestibular    PT Next Visit Plan Continue progressing pt. through balance activities and aerobic training.    Consulted and Agree with Plan of Care Patient           Patient will benefit from skilled therapeutic intervention in order to improve the following deficits and impairments:  Abnormal gait, Decreased activity tolerance, Decreased balance, Impaired vision/preception, Decreased strength  Visit Diagnosis: Muscle weakness (  generalized)  Other abnormalities of gait and mobility     Problem List Patient Active Problem List   Diagnosis Date Noted  . Visual disturbance 11/03/2019  . Abnormality of gait 11/03/2019  . Anemia of chronic disease   . CKD (chronic kidney disease), stage  II   . Controlled type 2 diabetes mellitus with hyperglycemia, without long-term current use of insulin ([place])   . Benign essential HTN   . 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 S/P VP shunt   . Diabetes mellitus type 2 in obese ([place])   . Acute blood loss anemia   . Elevated BUN 05/12/2019  . Elevated serum creatinine 05/12/2019  . Pituitary tumor 05/12/2019  . Vitamin D deficiency 05/12/2019  . DJD (degenerative joint disease) 05/12/2019  . 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 Febrile illness   . AKI (acute kidney injury) ([place]) 10/30/2018  . Pituitary adenoma ([place]) 10/30/2018  . Acute bacterial meningitis 10/30/2018  . Sepsis ([place]) 10/30/2018  . Headache 10/29/2018  . Acute encephalopathy 10/29/2018  . Rhabdomyolysis 10/29/2018  . History of pituitary adenoma 10/29/2018  . Glaucoma 10/29/2018  . Atherosclerosis 10/29/2018  . Acute purulent meningitis 10/29/2018  . CKD (chronic kidney disease), stage III 10/29/2018  . Meningitis 10/29/2018  . Fever 10/29/2018  . Malignant neoplasm of lower-outer quadrant of right breast of female, estrogen receptor negative ([place]) 06/09/2016  . Morbid obesity with BMI of 40.0-44.9, adult ([place]) 05/25/2015  . Type II diabetes mellitus, uncontrolled ([place]) 05/25/2015  . Normocytic anemia 04/27/2014  . Hyperlipidemia, unspecified 10/22/2011  . Essential hypertension with goal blood pressure less than 130/85 10/22/2011  . DM (diabetes mellitus) ([place]) 10/22/2011  . HTN (hypertension)   . Diabetes mellitus type II, non insulin dependent ([place])   . Dyslipidemia     Royann Shivers, SPTA 03/15/2020, 11:55 AM  [facility] [address] [place], [place], [address] Phone: [phone]   Fax:  [phone]  Name: [name] MRN: [id number] Date of Birth: 1956/08/13   This note has been reviewed and edited by supervising  CI.  Willow Ora, PTA, [facility] [address] [place], [place] [address] [phone] 03/15/20, 3:16 PM

## 2020-03-19 ENCOUNTER — Other Ambulatory Visit: Payer: Self-pay

## 2020-03-19 ENCOUNTER — Ambulatory Visit: Payer: BC Managed Care – PPO

## 2020-03-19 DIAGNOSIS — R2689 Other abnormalities of gait and mobility: Secondary | ICD-10-CM

## 2020-03-19 DIAGNOSIS — M6281 Muscle weakness (generalized): Secondary | ICD-10-CM

## 2020-03-19 NOTE — Therapy (Signed)
[facility] [address] [place], [place], [address] Phone: [phone]   Fax:  [phone]  Physical Therapy Treatment  Patient Details  Name: [name] MRN: [id number] Date of Birth: 07/26/1956 Referring Provider (PT): Delice Lesch   Encounter Date: 03/19/2020   PT End of Session - 03/19/20 1105    Visit Number 15    Number of Visits 17    Date for PT Re-Evaluation 03/31/20    PT Start Time 1102    PT Stop Time 1141    PT Time Calculation (min) 39 min    Equipment Utilized During Treatment Gait belt    Activity Tolerance Patient tolerated treatment well;Patient limited by pain    Behavior During Therapy [facility] for tasks assessed/performed           Past Medical History:  Diagnosis Date   Anemia    Breast cancer ([place]) 12/29/13   right 6:00 o'clock, lower outer   Cataract of left eye    Diabetes mellitus    type 2   Family history of malignant neoplasm of breast    Glaucoma    MD just watching, no eye drops   High cholesterol    History of blood transfusion 2015   with chemo treatments   History of radiation therapy 08/12/14- 10/11/14   right breast /50.4 Gy/28 fx, right breast boost/ 10 Gy/ 5 fx   HTN (hypertension)    Personal history of chemotherapy    Personal history of radiation therapy    Pituitary adenoma ([place]) 10/30/2018   Wears dentures    full top-partial bottom   Wears dentures    full   Wears glasses    Wears glasses     Past Surgical History:  Procedure Laterality Date   ABDOMINAL HYSTERECTOMY  1993   endometriosis   BREAST BIOPSY Left 12/29/2013   BREAST LUMPECTOMY Right 02/08/2014   BREAST LUMPECTOMY WITH AXILLARY LYMPH NODE BIOPSY Right 02/08/14   invasive ductal   CRANIOTOMY N/A 04/15/2019   Procedure: Transphenoid resection of pituitary tumor with fat graft;  Surgeon: Judith Part, MD;  Location: [facility];  Service: Neurosurgery;  Laterality: N/A;    IR IMAGE GUIDED FLUID DRAIN BY CATHETER  04/19/2019   MULTIPLE TOOTH EXTRACTIONS     PLACEMENT OF LUMBAR DRAIN N/A 04/24/2019   Procedure: PLACEMENT OF LUMBAR DRAIN;  Surgeon: Eustace Moore, MD;  Location: [name];  Service: Neurosurgery;  Laterality: N/A;   PLACEMENT OF LUMBAR DRAIN N/A 05/16/2019   Procedure: PLACEMENT OF LUMBAR DRAIN;  Surgeon: Newman Pies, MD;  Location: [facility];  Service: Neurosurgery;  Laterality: N/A;  PLACEMENT OF LUMBAR DRAIN   PORT-A-CATH REMOVAL     PORTACATH PLACEMENT Left 02/08/2014   Procedure: INSERTION PORT-A-CATH;  Surgeon: Rolm Bookbinder, MD;  Location: [facility];  Service: General;  Laterality: Left;   REPAIR OF CEREBROSPINAL FLUID LEAK N/A 04/29/2019   Procedure: Endoscopic Endonasal Cerebrospinal Fluid Leak repair, Abdominal wound revision and fat graft harvest, Intrathecal Fluorocein Injection;  Surgeon: Judith Part, MD;  Location: [facility];  Service: Neurosurgery;  Laterality: N/A;   TONSILLECTOMY     TRANSNASAL APPROACH N/A 04/15/2019   Procedure: TRANSNASAL APPROACH;  Surgeon: Jerrell Belfast, MD;  Location: [facility];  Service: ENT;  Laterality: N/A;   TRANSPHENOIDAL APPROACH EXPOSURE N/A 04/15/2019   Procedure: ENDOSCOPIC TRANS NASAL APPROACH WITH FUSIONN;  Surgeon: Jerrell Belfast, MD;  Location: [facility];  Service: ENT;  Laterality: N/A;  TRANSPHENOIDAL APPROACH EXPOSURE N/A 04/29/2019   Procedure: Endoscopic repair of spinal fluid leak ;  Surgeon: Jerrell Belfast, MD;  Location: [facility];  Service: ENT;  Laterality: N/A;   TRANSPHENOIDAL PITUITARY RESECTION  2006   VENTRICULOPERITONEAL SHUNT Right 05/31/2019   Procedure: SHUNT INSERTION VENTRICULAR-PERITONEAL; REMOVAL OF LUMBAR DRAIN;  Surgeon: Judith Part, MD;  Location: [facility];  Service: Neurosurgery;  Laterality: Right;  SHUNT INSERTION VENTRICULAR-PERITONEAL; REMOVAL OF LUMBAR DRAIN   WISDOM TOOTH EXTRACTION      There were no vitals filed for this visit.    Subjective Assessment - 03/19/20 1106    Subjective Pt reports that she is doing ok today. Thinks knee is asleep as not bothering her currently.    Pertinent History 64 y.o. 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    Currently in Pain? Yes    Pain Score 0-No pain    Pain Location Knee    Pain Orientation Left    Pain Onset More than a month ago              [facility] PT Assessment - 03/19/20 1110      6 Minute Walk- Baseline   6 Minute Walk- Baseline yes    HR (bpm) 78    Modified Borg Scale for Dyspnea 0- Nothing at all      6 Minute walk- Post Test   6 Minute Walk Post Test yes    HR (bpm) 102    Modified Borg Scale for Dyspnea 6-      6 minute walk test results    Aerobic Endurance Distance Walked 865    Endurance additional comments Pt denied any increased pain in knee.      Functional Gait  Assessment   Gait assessed  Yes    Gait Level Surface Walks 20 ft in less than 7 sec but greater than 5.5 sec, uses assistive device, slower speed, mild gait deviations, or deviates 6-10 in outside of the 12 in walkway width.    Change in Gait Speed Able to smoothly change walking speed without loss of balance or gait deviation. Deviate no more than 6 in outside of the 12 in walkway width.    Gait with Horizontal Head Turns Performs head turns smoothly with no change in gait. Deviates no more than 6 in outside 12 in walkway width    Gait with Vertical Head Turns Performs head turns with no change in gait. Deviates no more than 6 in outside 12 in walkway width.    Gait and Pivot Turn Pivot turns safely within 3 sec and stops quickly with no loss of balance.    Step Over Obstacle Is able to step over one shoe box (4.5 in total height) without changing gait speed. No evidence of imbalance.    Gait with Narrow Base of  Support Ambulates 4-7 steps.    Gait with Eyes Closed Walks 20 ft, uses assistive device, slower speed, mild gait deviations, deviates 6-10 in outside 12 in walkway width. Ambulates 20 ft in less than 9 sec but greater than 7 sec.    Ambulating Backwards Walks 20 ft, uses assistive device, slower speed, mild gait deviations, deviates 6-10 in outside 12 in walkway width.    Steps Two feet to a stair, must use rail.    Total Score  [facility] Adult PT Treatment/Exercise - 03/19/20 1110      Ambulation/Gait   Ambulation/Gait Yes    Ambulation/Gait Assistance 5: Supervision    Assistive device None    Gait Pattern Step-through pattern;Antalgic    Ambulation Surface Level;Indoor    Stairs Yes    Stairs Assistance 6: Modified independent (Device/Increase time)    Stair Management Technique Alternating pattern;Step to pattern;Two rails   reciprocal up and step-to down.   Number of Stairs 4      Neuro Re-ed    Neuro Re-ed Details  Standing on rockerboard in corner positioned ant/post maintaining level 30 sec x 2 then with looking up/down x 10 CGA as decreased stability.                  PT Education - 03/19/20 1210    Education Details Discussed recert plan and results of testing.    Person(s) Educated Patient    Methods Explanation    Comprehension Verbalized understanding            PT Short Term Goals - 02/29/20 2105      PT SHORT TERM GOAL #1   Title Pt will be independent with initial HEP for strength, balance and walking program.    Baseline 02/28/20: met with current program (pt reports they are still challenging)    Status Achieved    Target Date 03/01/20      PT SHORT TERM GOAL #2   Title Pt will increase gait speed to >0.59ms for improved gait safety.    Baseline 02/28/20: 1.09 m/s no AD    Status Achieved    Target Date 03/01/20      PT SHORT TERM GOAL #3   Title Pt wil decreased 5 x sit to stand from 17.2 sec to <15 sec  for improved balance and functional strength.    Baseline 02/28/20: 13.06 sec's no UE support from standard height chair    Status Achieved    Target Date 03/01/20      PT SHORT TERM GOAL #4   Title FGA and 6 min walk will be assessed and LTGs written to further assess balance and gait.    Status Achieved    Target Date 03/01/20             PT Long Term Goals - 03/19/20 1122      PT LONG TERM GOAL #1   Title Pt will be independent with progressive HEP for strength, balance and gait to continue gains on own. ( all LTGs due 03/31/20)    Time 8    Period Weeks    Status New      PT LONG TERM GOAL #2   Title Pt will ambulate > 800' on varied surfaces independently with scanning environment for improved gait safety in community.    Time 8    Period Weeks    Status New      PT LONG TERM GOAL #3   Title Pt will increase FGA from 19/30 to 24 or more for improved balance and gait safety.    Baseline 22/30 on 03/19/20    Time 8    Period Weeks    Status Partially Met      PT LONG TERM GOAL #4   Title Pt will increase 6 min walk to >950' without rest break  for improved activity tolerance and community mobilty.    Baseline 805' with 1 standing rest break., 865' without rest break    Time 8    Period Weeks    Status Not Met                 Plan - 03/19/20 1133    Clinical Impression Statement PT starting to check LTGs as this is week 8. Pt showed improvement on FGA score up to 22/30 but still fall risk and short of goal. Pt was able to show improving activity tolerance with completing 6 min without  rest break today but was short of distance portion of goal. Pt continues to show progress towards goals.    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 I had her schedule today. Please check gait on varied surfaces goal and HEP goal and [facility] to [facility] for recert as this is week 8. Continue progressing pt. through balance activities and aerobic training.    Consulted and Agree with Plan of Care Patient           Patient will benefit from skilled therapeutic intervention in order to improve the following deficits and impairments:  Abnormal gait, Decreased activity tolerance, Decreased balance, Impaired vision/preception, Decreased strength  Visit Diagnosis: Other abnormalities of gait and mobility  Muscle weakness (generalized)     Problem List Patient Active Problem List   Diagnosis Date Noted   Visual disturbance 11/03/2019   Abnormality of gait 11/03/2019   Anemia of chronic disease    CKD (chronic kidney disease), stage II    Controlled type 2 diabetes mellitus with hyperglycemia, without long-term current use of insulin (HCC)    Benign essential HTN    Cerebral ventriculitis 06/11/2019   Communicating hydrocephalus (HCC)    S/P VP shunt    Diabetes mellitus type 2 in obese (HCC)    Acute blood loss anemia    Elevated BUN 05/12/2019   Elevated serum creatinine 05/12/2019   Pituitary tumor 05/12/2019   Vitamin D deficiency 05/12/2019   DJD (degenerative joint disease) 05/12/2019   CSF leak 04/24/2019   Nasal septal perforation 03/16/2019   Febrile illness    AKI (acute kidney injury) ([place])  10/30/2018   Pituitary adenoma ([place]) 10/30/2018   Acute bacterial meningitis 10/30/2018   Sepsis ([place]) 10/30/2018   Headache 10/29/2018   Acute encephalopathy 10/29/2018   Rhabdomyolysis 10/29/2018   History of pituitary adenoma 10/29/2018   Glaucoma 10/29/2018   Atherosclerosis 10/29/2018   Acute purulent meningitis 10/29/2018   CKD (chronic kidney disease), stage III 10/29/2018   Meningitis 10/29/2018   Fever 10/29/2018   Malignant neoplasm of lower-outer quadrant of right breast of female, estrogen receptor negative ([place]) 06/09/2016   Morbid obesity with BMI of 40.0-44.9, adult ([place]) 05/25/2015   Type II diabetes mellitus, uncontrolled ([place]) 05/25/2015   Normocytic anemia 04/27/2014   Hyperlipidemia, unspecified 10/22/2011  Essential hypertension with goal blood pressure less than 130/85 10/22/2011   DM (diabetes mellitus) ([place]) 10/22/2011   HTN (hypertension)    Diabetes mellitus type II, non insulin dependent ([place])    Dyslipidemia     Electa Sniff, PT, DPT, NCS 03/19/2020, 12:14 PM  [facility] [address] [place], [place], [address] Phone: [phone]   Fax:  [phone]  Name: [name] MRN: [id number] Date of Birth: 1956/08/19

## 2020-03-21 ENCOUNTER — Ambulatory Visit: Payer: BC Managed Care – PPO | Admitting: Physical Therapy

## 2020-03-21 ENCOUNTER — Encounter: Payer: Self-pay | Admitting: Physical Therapy

## 2020-03-21 ENCOUNTER — Other Ambulatory Visit: Payer: Self-pay

## 2020-03-21 DIAGNOSIS — R41840 Attention and concentration deficit: Secondary | ICD-10-CM

## 2020-03-21 DIAGNOSIS — R2689 Other abnormalities of gait and mobility: Secondary | ICD-10-CM | POA: Diagnosis not present

## 2020-03-21 DIAGNOSIS — M6281 Muscle weakness (generalized): Secondary | ICD-10-CM

## 2020-03-21 NOTE — Therapy (Addendum)
[facility] [address] [place], [place], [address] Phone: [phone]   Fax:  [phone]  Physical Therapy Treatment/Recert  Patient Details  Name: [name] MRN: [id number] Date of Birth: April 27, 1956 Referring Provider (PT): Delice Lesch   Encounter Date: 03/21/2020    03/21/20 1021  PT Visits / Re-Eval  Visit Number 16  Number of Visits 20  Date for PT Re-Evaluation 05/09/20  PT Time Calculation  PT Start Time 1020  PT Stop Time 1100  PT Time Calculation (min) 40 min  PT - End of Session  Equipment Utilized During Treatment Gait belt  Activity Tolerance Patient tolerated treatment well;Patient limited by pain  Behavior During Therapy [facility] for tasks assessed/performed    Past Medical History:  Diagnosis Date  . Anemia   . Breast cancer ([place]) 12/29/13   right 6:00 o'clock, lower outer  . Cataract of left eye   . Diabetes mellitus    type 2  . Family history of malignant neoplasm of breast   . Glaucoma    MD just watching, no eye drops  . High cholesterol   . History of blood transfusion 2015   with chemo treatments  . History of radiation therapy 08/12/14- 10/11/14   right breast /50.4 Gy/28 fx, right breast boost/ 10 Gy/ 5 fx  . HTN (hypertension)   . Personal history of chemotherapy   . Personal history of radiation therapy   . Pituitary adenoma ([place]) 10/30/2018  . Wears dentures    full top-partial bottom  . Wears dentures    full  . Wears glasses   . Wears glasses     Past Surgical History:  Procedure Laterality Date  . ABDOMINAL HYSTERECTOMY  1993   endometriosis  . BREAST BIOPSY Left 12/29/2013  . BREAST LUMPECTOMY Right 02/08/2014  . BREAST LUMPECTOMY WITH AXILLARY LYMPH NODE BIOPSY Right 02/08/14   invasive ductal  . CRANIOTOMY N/A 04/15/2019   Procedure: Transphenoid resection of pituitary tumor with fat graft;  Surgeon: Judith Part, MD;  Location: [place];  Service:  Neurosurgery;  Laterality: N/A;  . IR IMAGE GUIDED FLUID DRAIN BY CATHETER  04/19/2019  . MULTIPLE TOOTH EXTRACTIONS    . PLACEMENT OF LUMBAR DRAIN N/A 04/24/2019   Procedure: PLACEMENT OF LUMBAR DRAIN;  Surgeon: Eustace Moore, MD;  Location: [place];  Service: Neurosurgery;  Laterality: N/A;  . PLACEMENT OF LUMBAR DRAIN N/A 05/16/2019   Procedure: PLACEMENT OF LUMBAR DRAIN;  Surgeon: Newman Pies, MD;  Location: [place];  Service: Neurosurgery;  Laterality: N/A;  PLACEMENT OF LUMBAR DRAIN  . PORT-A-CATH REMOVAL    . PORTACATH PLACEMENT Left 02/08/2014   Procedure: INSERTION PORT-A-CATH;  Surgeon: Rolm Bookbinder, MD;  Location: [facility];  Service: General;  Laterality: Left;  . REPAIR OF CEREBROSPINAL FLUID LEAK N/A 04/29/2019   Procedure: Endoscopic Endonasal Cerebrospinal Fluid Leak repair, Abdominal wound revision and fat graft harvest, Intrathecal Fluorocein Injection;  Surgeon: Judith Part, MD;  Location: [place];  Service: Neurosurgery;  Laterality: N/A;  . TONSILLECTOMY    . TRANSNASAL APPROACH N/A 04/15/2019   Procedure: TRANSNASAL APPROACH;  Surgeon: Jerrell Belfast, MD;  Location: [facility];  Service: ENT;  Laterality: N/A;  . TRANSPHENOIDAL APPROACH EXPOSURE N/A 04/15/2019   Procedure: ENDOSCOPIC TRANS NASAL APPROACH WITH FUSIONN;  Surgeon: Jerrell Belfast, MD;  Location: [place];  Service: ENT;  Laterality: N/A;  . TRANSPHENOIDAL APPROACH EXPOSURE N/A 04/29/2019   Procedure: Endoscopic repair of spinal  fluid leak ;  Surgeon: Jerrell Belfast, MD;  Location: [facility];  Service: ENT;  Laterality: N/A;  . TRANSPHENOIDAL PITUITARY RESECTION  2006  . VENTRICULOPERITONEAL SHUNT Right 05/31/2019   Procedure: SHUNT INSERTION VENTRICULAR-PERITONEAL; REMOVAL OF LUMBAR DRAIN;  Surgeon: Judith Part, MD;  Location: [facility];  Service: Neurosurgery;  Laterality: Right;  SHUNT INSERTION VENTRICULAR-PERITONEAL; REMOVAL OF LUMBAR DRAIN  . WISDOM TOOTH EXTRACTION      There were no  vitals filed for this visit.   Subjective Assessment - 03/21/20 1020    Subjective No changes since last visit, no falls. Pt. has no complaints of pain today.    Pertinent History 64 y.o. 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    Currently in Pain? No/denies    Pain Score 0-No pain    Pain Onset More than a month ago    Multiple Pain Sites No            [facility] Adult PT Treatment/Exercise - 03/21/20 1042      Ambulation/Gait   Ambulation/Gait Yes    Ambulation/Gait Assistance 5: Supervision;4: Min guard    Ambulation/Gait Assistance Details Pt. was supervision to min guard with gait due to increased lateral sway that increased with fatigue.   Ambulation Distance (Feet) 1000 Feet    Assistive device None    Gait Pattern Trendelenburg;Decreased dorsiflexion - left    Ambulation Surface Outdoor;Paved;Grass;Gravel;Other (comment);Unlevel    Gait Comments Pt required one rest break secondary to fatigue and to rest the L knee; HR 113 after gait. Pt. then performed gait over grass, mulch and gravel for 50 ft total (10 feet each for mulch and gravel).; pt. had LOB x 1 when transitioning from grass to pavement, pt. required min. Assist to correct.           Balance Exercises - 03/21/20 1044      Balance Exercises: Standing   Standing Eyes Opened Foam/compliant surface;30 secs;4 reps;Limitations    Standing Eyes Opened Limitations Pt. performed partial tandem stance on blue airex pad for 2 reps with L foot fwd and 2 with R foot fwd; no UE use    Standing Eyes Closed Narrow base of support (BOS);3 reps;30 secs;Limitations    Standing Eyes Closed Limitations Pt. performed narrow BOS with EC and no UE support on blue airex pad in the corner with chair in front    Tandem Stance --            PT Short Term Goals  - 03/21/20 1022      PT SHORT TERM GOAL #1   Title Pt will be independent with initial HEP for strength, balance and walking program.    Baseline 02/28/20: met with current program (pt reports they are still challenging)    Target Date 03/01/20      PT SHORT TERM GOAL #2   Title Pt will increase gait speed to >0.45ms for improved gait safety.    Baseline 02/28/20: 1.09 m/s no AD    Status Achieved    Target Date 03/01/20      PT SHORT TERM GOAL #3   Title Pt wil decreased 5 x sit to stand from 17.2 sec to <15 sec for improved balance and functional strength.    Baseline 02/28/20: 13.06 sec's no UE support from standard height chair  Status Achieved    Target Date 03/01/20      PT SHORT TERM GOAL #4   Title FGA and 6 min walk will be assessed and LTGs written to further assess balance and gait.    Status Achieved    Target Date 03/01/20             PT Long Term Goals - 03/21/20 1023      PT LONG TERM GOAL #1   Title Pt will be independent with progressive HEP for strength, balance and gait to continue gains on own. ( all LTGs due 03/31/20)    Baseline 03/21/20: pt. verbalizes independence.    Time 8    Period Weeks    Status Achieved      PT LONG TERM GOAL #2   Title Pt will ambulate > 800' on varied surfaces independently with scanning environment for improved gait safety in community.    Baseline 03/21/20; pt. performed gait outdoors with no AD for 1000 ft. while scanning the environment with supervision to min guard assist   Time 8    Period Weeks    Status Partially Met     PT LONG TERM GOAL #3   Title Pt will increase FGA from 19/30 to 24 or more for improved balance and gait safety.    Baseline 22/30 on 03/19/20    Time 8    Period Weeks    Status Partially Met      PT LONG TERM GOAL #4   Title Pt will increase 6 min walk to >950' without rest break for improved activity tolerance and community mobilty.    Baseline 805' with 1 standing rest break., 865' without  rest break    Time 8    Period Weeks    Status Not Met         Updated goals:  PT Short Term Goals - 03/23/20 1303      PT SHORT TERM GOAL #1   Title STGs=LTGs           PT Long Term Goals - 03/23/20 1304      PT LONG TERM GOAL #1   Title Pt will increase FGA from 19/30 to 24 or more for improved balance and gait safety.    Baseline 22/30 on 03/19/20    Time 4    Period Weeks    Status On-going    Target Date 04/22/20      PT LONG TERM GOAL #2   Title Pt will increase 6 min walk to >950' without rest break for improved activity tolerance and community mobilty.    Baseline 805' with 1 standing rest break., 865' without rest break on 03/19/20    Time 4    Period Weeks    Status On-going    Target Date 04/22/20               03/21/20 1111  Plan  Clinical Impression Statement Todays session placed emphasis on checking the remaining LTG's. Pt. met Goals # 1 & 2 in todays session by verbalizing independence with HEP and ambulating for 1000 ft while scanning the environment. Pt continues to progress with activity tolerance as shown by her decreased baseline HR in todays session and her ability to decrease her HR with breathing and rest. Pt. will benefit from further PT to progress toward unmet goals. PT addendum: Pt showed progress on both 6 min walk and FGA but was short of goal on both. Still moderate fall  risk based on FGA score of 22/30 last visit. PT will continue to work towards these goals.  Personal Factors and Comorbidities Comorbidity 3+  Comorbidities T2DM, breast cancer, pituitary tumor s/p resection in 08/2005, bacterial meningitis, VP shunt placement due to hydrocephalus, CSF leak, and ventriculitis  Examination-Activity Limitations Locomotion Level  Examination-Participation Restrictions Community Activity;Driving  Pt will benefit from skilled therapeutic intervention in order to improve on the following deficits Abnormal gait;Decreased activity tolerance;Decreased  balance;Impaired vision/preception;Decreased strength  Stability/Clinical Decision Making Evolving/Moderate complexity  Rehab Potential Good  PT Frequency 1x / week  PT Duration 4 weeks  PT Treatment/Interventions ADLs/Self Care Home Management;Cryotherapy;Moist Heat;Therapeutic activities;Therapeutic exercise;Balance training;Neuromuscular re-education;Manual techniques;Passive range of motion;Patient/family education;Vestibular  PT Next Visit Plan Continue progressing pt. through balance training and aerobic activity to increase activity tolerance. Continue working on gait on grass, mulch, etc.  Consulted and Agree with Plan of Care Patient     Patient will benefit from skilled therapeutic intervention in order to improve the following deficits and impairments:  Abnormal gait, Decreased activity tolerance, Decreased balance, Impaired vision/preception, Decreased strength  Visit Diagnosis: Other abnormalities of gait and mobility  Muscle weakness (generalized)  Attention and concentration deficit     Problem List Patient Active Problem List   Diagnosis Date Noted  . Visual disturbance 11/03/2019  . Abnormality of gait 11/03/2019  . Anemia of chronic disease   . CKD (chronic kidney disease), stage II   . Controlled type 2 diabetes mellitus with hyperglycemia, without long-term current use of insulin ([place])   . Benign essential HTN   . 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 S/P VP shunt   . Diabetes mellitus type 2 in obese ([place])   . Acute blood loss anemia   . Elevated BUN 05/12/2019  . Elevated serum creatinine 05/12/2019  . Pituitary tumor 05/12/2019  . Vitamin D deficiency 05/12/2019  . DJD (degenerative joint disease) 05/12/2019  . 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 Febrile illness   . AKI (acute kidney injury) ([place]) 10/30/2018  . Pituitary adenoma ([place]) 10/30/2018  . Acute bacterial meningitis 10/30/2018   . Sepsis ([place]) 10/30/2018  . Headache 10/29/2018  . Acute encephalopathy 10/29/2018  . Rhabdomyolysis 10/29/2018  . History of pituitary adenoma 10/29/2018  . Glaucoma 10/29/2018  . Atherosclerosis 10/29/2018  . Acute purulent meningitis 10/29/2018  . CKD (chronic kidney disease), stage III 10/29/2018  . Meningitis 10/29/2018  . Fever 10/29/2018  . Malignant neoplasm of lower-outer quadrant of right breast of female, estrogen receptor negative ([place]) 06/09/2016  . Morbid obesity with BMI of 40.0-44.9, adult ([place]) 05/25/2015  . Type II diabetes mellitus, uncontrolled ([place]) 05/25/2015  . Normocytic anemia 04/27/2014  . Hyperlipidemia, unspecified 10/22/2011  . Essential hypertension with goal blood pressure less than 130/85 10/22/2011  . DM (diabetes mellitus) ([place]) 10/22/2011  . HTN (hypertension)   . Diabetes mellitus type II, non insulin dependent ([place])   . Dyslipidemia     Royann Shivers, SPTA 03/21/2020, 11:17 AM  [facility] [address] [place], [place], [address] Phone: [phone]   Fax:  [phone]  Name: [name] MRN: [id number] Date of Birth: 09-11-1955  This note has been reviewed and edited by supervising CI.  Willow Ora, PTA, [facility] [address] [place], [place] [address] [phone] 03/22/20, 4:09 PM Cherly Anderson, PT, DPT, NCS

## 2020-03-22 ENCOUNTER — Encounter: Payer: BC Managed Care – PPO | Admitting: Physical Medicine & Rehabilitation

## 2020-03-23 NOTE — Addendum Note (Signed)
Addended by: Cherly Anderson A on: 03/23/2020 01:11 PM   Modules accepted: Orders

## 2020-03-27 ENCOUNTER — Encounter
Payer: BC Managed Care – PPO | Attending: Physical Medicine & Rehabilitation | Admitting: Physical Medicine & Rehabilitation

## 2020-03-27 ENCOUNTER — Ambulatory Visit: Payer: BC Managed Care – PPO

## 2020-03-27 ENCOUNTER — Encounter: Payer: Self-pay | Admitting: Physical Medicine & Rehabilitation

## 2020-03-27 ENCOUNTER — Other Ambulatory Visit: Payer: Self-pay

## 2020-03-27 VITALS — BP 120/70 | HR 82 | Temp 98.3°F | Ht 66.0 in | Wt 235.0 lb

## 2020-03-27 DIAGNOSIS — H539 Unspecified visual disturbance: Secondary | ICD-10-CM | POA: Diagnosis present

## 2020-03-27 DIAGNOSIS — R269 Unspecified abnormalities of gait and mobility: Secondary | ICD-10-CM

## 2020-03-27 DIAGNOSIS — G0490 Encephalitis and encephalomyelitis, unspecified: Secondary | ICD-10-CM

## 2020-03-27 NOTE — Progress Notes (Signed)
Subjective:    Patient ID: [name], female    DOB: 02-Feb-1956, 64 y.o.   MRN: [id number]  [name] is a 64 y.o. 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  Last clinic visit on 12/22/19. Since that time, she is still in therapies. She continues to follow up with Neuro. She notes improving in word finding difficulties. Denies falls. She is still at her dad's home. She notes Left visual field cut persists.  She followed up with ?Optho.  Pain Inventory Average Pain 5 Pain Right Now 6 My pain is aching  In the last 24 hours, has pain interfered with the following? General activity 0 Relation with others 0 Enjoyment of life 0 What TIME of day is your pain at its worst? varies Sleep (in general) Good  Pain is worse with: unsure Pain improves with: rest Relief from Meds: na  Mobility walk with assistance use a cane ability to climb steps?  yes do you drive?  no  Function employed # of hrs/week FMLA  Neuro/Psych weakness tingling trouble walking dizziness  Prior Studies Any changes since last visit?  no  Physicians involved in your care Any changes since last visit?  no   Family History  Problem Relation Age of Onset  . Hypertension Mother   . Diabetes type II Mother   . Breast cancer Mother        dx ~47; deceased 30  . Hypertension Father   . Breast cancer Maternal Aunt        deceased 70  . Cancer Maternal Uncle        unk. type; deceased 50s  . Cancer Maternal Uncle        unk. type; deceased late 70s  . Uterine cancer Cousin 50       daughter of an unaffected mat aunt who is 17   Social History   Socioeconomic History  . Marital status: Widowed    Spouse name: Not on file  . Number of children: Not on file  . Years of education: Not on file  . Highest education level:  Not on file  Occupational History  . Not on file  Tobacco Use  . Smoking status: Former Smoker    Types: Cigarettes    Quit date: 01/31/1994    Years since quitting: 26.1  . Smokeless tobacco: Never Used  Vaping Use  . Vaping Use: Never used  Substance and Sexual Activity  . Alcohol use: Not Currently    Comment: occasional wine  . Drug use: No  . Sexual activity: Not on file    Comment: menarche age 47, first live birth age 51, P59, TAH-BSO age 47   Other Topics Concern  . Not on file  Social History Narrative  . Not on file   Social Determinants of Health   Financial Resource Strain:   . Difficulty of Paying Living Expenses:   Food Insecurity:   . Worried About Charity fundraiser in the Last Year:   . Arboriculturist in the Last Year:   Transportation Needs:   . Film/video editor (Medical):   Marland Kitchen Lack of Transportation (Non-Medical):   Physical Activity:   . Days of Exercise per Week:   . Minutes of Exercise per Session:   Stress:   . Feeling of Stress :   Social  Connections:   . Frequency of Communication with Friends and Family:   . Frequency of Social Gatherings with Friends and Family:   . Attends Religious Services:   . Active Member of Clubs or Organizations:   . Attends Archivist Meetings:   Marland Kitchen Marital Status:    Past Surgical History:  Procedure Laterality Date  . ABDOMINAL HYSTERECTOMY  1993   endometriosis  . BREAST BIOPSY Left 12/29/2013  . BREAST LUMPECTOMY Right 02/08/2014  . BREAST LUMPECTOMY WITH AXILLARY LYMPH NODE BIOPSY Right 02/08/14   invasive ductal  . CRANIOTOMY N/A 04/15/2019   Procedure: Transphenoid resection of pituitary tumor with fat graft;  Surgeon: Judith Part, MD;  Location: [facility];  Service: Neurosurgery;  Laterality: N/A;  . IR IMAGE GUIDED FLUID DRAIN BY CATHETER  04/19/2019  . MULTIPLE TOOTH EXTRACTIONS    . PLACEMENT OF LUMBAR DRAIN N/A 04/24/2019   Procedure: PLACEMENT OF LUMBAR DRAIN;  Surgeon: Eustace Moore, MD;  Location: [facility];  Service: Neurosurgery;  Laterality: N/A;  . PLACEMENT OF LUMBAR DRAIN N/A 05/16/2019   Procedure: PLACEMENT OF LUMBAR DRAIN;  Surgeon: Newman Pies, MD;  Location: [facility];  Service: Neurosurgery;  Laterality: N/A;  PLACEMENT OF LUMBAR DRAIN  . PORT-A-CATH REMOVAL    . PORTACATH PLACEMENT Left 02/08/2014   Procedure: INSERTION PORT-A-CATH;  Surgeon: Rolm Bookbinder, MD;  Location: [facility];  Service: General;  Laterality: Left;  . REPAIR OF CEREBROSPINAL FLUID LEAK N/A 04/29/2019   Procedure: Endoscopic Endonasal Cerebrospinal Fluid Leak repair, Abdominal wound revision and fat graft harvest, Intrathecal Fluorocein Injection;  Surgeon: Judith Part, MD;  Location: [facility];  Service: Neurosurgery;  Laterality: N/A;  . TONSILLECTOMY    . TRANSNASAL APPROACH N/A 04/15/2019   Procedure: TRANSNASAL APPROACH;  Surgeon: Jerrell Belfast, MD;  Location: [facility];  Service: ENT;  Laterality: N/A;  . TRANSPHENOIDAL APPROACH EXPOSURE N/A 04/15/2019   Procedure: ENDOSCOPIC TRANS NASAL APPROACH WITH FUSIONN;  Surgeon: Jerrell Belfast, MD;  Location: [facility];  Service: ENT;  Laterality: N/A;  . TRANSPHENOIDAL APPROACH EXPOSURE N/A 04/29/2019   Procedure: Endoscopic repair of spinal fluid leak ;  Surgeon: Jerrell Belfast, MD;  Location: [facility];  Service: ENT;  Laterality: N/A;  . TRANSPHENOIDAL PITUITARY RESECTION  2006  . VENTRICULOPERITONEAL SHUNT Right 05/31/2019   Procedure: SHUNT INSERTION VENTRICULAR-PERITONEAL; REMOVAL OF LUMBAR DRAIN;  Surgeon: Judith Part, MD;  Location: [facility];  Service: Neurosurgery;  Laterality: Right;  SHUNT INSERTION VENTRICULAR-PERITONEAL; REMOVAL OF LUMBAR DRAIN  . WISDOM TOOTH EXTRACTION     Past Medical History:  Diagnosis Date  . Anemia   . Breast cancer ([place]) 12/29/13   right 6:00 o'clock, lower outer  . Cataract of left eye   . Diabetes mellitus    type 2  . Family history of malignant neoplasm of breast   . Glaucoma     MD just watching, no eye drops  . High cholesterol   . History of blood transfusion 2015   with chemo treatments  . History of radiation therapy 08/12/14- 10/11/14   right breast /50.4 Gy/28 fx, right breast boost/ 10 Gy/ 5 fx  . HTN (hypertension)   . Personal history of chemotherapy   . Personal history of radiation therapy   . Pituitary adenoma ([place]) 10/30/2018  . Wears dentures    full top-partial bottom  . Wears dentures    full  . Wears glasses   . Wears glasses    BP 120/70  Pulse 82   Temp 98.3 F (36.8 C)   Ht 5\' 6"  (1.676 m)   Wt 235 lb (106.6 kg)   LMP  (LMP Unknown)   BMI 37.93 kg/m   Opioid Risk Score:   Fall Risk Score:  `1  Depression screen PHQ 2/9  Depression screen PHQ 2/9 08/11/2014  Decreased Interest 0  Down, Depressed, Hopeless 0  PHQ - 2 Score 0  Some recent data might be hidden    Review of Systems  Constitutional: Negative.   HENT: Negative.   Eyes: Negative.   Respiratory: Negative.   Cardiovascular: Negative.   Gastrointestinal: Negative.   Endocrine: Negative.   Genitourinary: Negative.   Musculoskeletal: Positive for arthralgias and gait problem.  Skin: Negative.   Allergic/Immunologic: Negative.   Neurological: Positive for weakness and numbness.  Hematological: Negative.   Psychiatric/Behavioral: Negative.        Objective:    Constitutional: NAD. Psych: Normal mood.  Normal behavior. Musc: No edema in extremities.  No tenderness in extremities. Gait: Slightly slow cadence Neuro: Alert and oriented, some word finding difficulty, stable Motor: 5/5 throughout    Assessment & Plan:  [name] is a 64 y.o. 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  1. 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              Continue outpatient therapies             Continue follow up with Neurosurg             Patient would like to transition to her own home, which is not handicap accessible, accommodations being made at present             Cognition improving - word finding difficulties continue to improve  2. Impaired vision             Left visual field deficits persist             Likely will persist  3. Gait abnormality             Continue outpatient PT             Continue cane for safety

## 2020-03-29 ENCOUNTER — Ambulatory Visit: Payer: BC Managed Care – PPO | Admitting: Physical Therapy

## 2020-04-03 ENCOUNTER — Other Ambulatory Visit: Payer: Self-pay

## 2020-04-03 ENCOUNTER — Encounter: Payer: Self-pay | Admitting: Physical Therapy

## 2020-04-03 ENCOUNTER — Ambulatory Visit: Payer: BC Managed Care – PPO | Admitting: Physical Therapy

## 2020-04-03 DIAGNOSIS — M6281 Muscle weakness (generalized): Secondary | ICD-10-CM

## 2020-04-03 DIAGNOSIS — R2689 Other abnormalities of gait and mobility: Secondary | ICD-10-CM | POA: Diagnosis not present

## 2020-04-03 NOTE — Therapy (Signed)
[facility] [address] [place], [place], [address] Phone: [phone]   Fax:  [phone]  Physical Therapy Treatment  Patient Details  Name: [name] MRN: [id number] Date of Birth: 1956-04-13 Referring Provider (PT): Delice Lesch   Encounter Date: 04/03/2020   PT End of Session - 04/03/20 1021    Visit Number 17    Number of Visits 20    Date for PT Re-Evaluation 05/09/20    PT Start Time 1016    PT Stop Time 1100    PT Time Calculation (min) 44 min    Equipment Utilized During Treatment Gait belt    Activity Tolerance Patient tolerated treatment well;Patient limited by pain    Behavior During Therapy [facility] for tasks assessed/performed           Past Medical History:  Diagnosis Date  . Anemia   . Breast cancer ([place]) 12/29/13   right 6:00 o'clock, lower outer  . Cataract of left eye   . Diabetes mellitus    type 2  . Family history of malignant neoplasm of breast   . Glaucoma    MD just watching, no eye drops  . High cholesterol   . History of blood transfusion 2015   with chemo treatments  . History of radiation therapy 08/12/14- 10/11/14   right breast /50.4 Gy/28 fx, right breast boost/ 10 Gy/ 5 fx  . HTN (hypertension)   . Personal history of chemotherapy   . Personal history of radiation therapy   . Pituitary adenoma ([place]) 10/30/2018  . Wears dentures    full top-partial bottom  . Wears dentures    full  . Wears glasses   . Wears glasses     Past Surgical History:  Procedure Laterality Date  . ABDOMINAL HYSTERECTOMY  1993   endometriosis  . BREAST BIOPSY Left 12/29/2013  . BREAST LUMPECTOMY Right 02/08/2014  . BREAST LUMPECTOMY WITH AXILLARY LYMPH NODE BIOPSY Right 02/08/14   invasive ductal  . CRANIOTOMY N/A 04/15/2019   Procedure: Transphenoid resection of pituitary tumor with fat graft;  Surgeon: Judith Part, MD;  Location: [facility];  Service: Neurosurgery;  Laterality: N/A;   . IR IMAGE GUIDED FLUID DRAIN BY CATHETER  04/19/2019  . MULTIPLE TOOTH EXTRACTIONS    . PLACEMENT OF LUMBAR DRAIN N/A 04/24/2019   Procedure: PLACEMENT OF LUMBAR DRAIN;  Surgeon: Eustace Moore, MD;  Location: [facility];  Service: Neurosurgery;  Laterality: N/A;  . PLACEMENT OF LUMBAR DRAIN N/A 05/16/2019   Procedure: PLACEMENT OF LUMBAR DRAIN;  Surgeon: Newman Pies, MD;  Location: [facility];  Service: Neurosurgery;  Laterality: N/A;  PLACEMENT OF LUMBAR DRAIN  . PORT-A-CATH REMOVAL    . PORTACATH PLACEMENT Left 02/08/2014   Procedure: INSERTION PORT-A-CATH;  Surgeon: Rolm Bookbinder, MD;  Location: [facility];  Service: General;  Laterality: Left;  . REPAIR OF CEREBROSPINAL FLUID LEAK N/A 04/29/2019   Procedure: Endoscopic Endonasal Cerebrospinal Fluid Leak repair, Abdominal wound revision and fat graft harvest, Intrathecal Fluorocein Injection;  Surgeon: Judith Part, MD;  Location: [facility];  Service: Neurosurgery;  Laterality: N/A;  . TONSILLECTOMY    . TRANSNASAL APPROACH N/A 04/15/2019   Procedure: TRANSNASAL APPROACH;  Surgeon: Jerrell Belfast, MD;  Location: [facility];  Service: ENT;  Laterality: N/A;  . TRANSPHENOIDAL APPROACH EXPOSURE N/A 04/15/2019   Procedure: ENDOSCOPIC TRANS NASAL APPROACH WITH FUSIONN;  Surgeon: Jerrell Belfast, MD;  Location: [facility];  Service: ENT;  Laterality: N/A;  .  TRANSPHENOIDAL APPROACH EXPOSURE N/A 04/29/2019   Procedure: Endoscopic repair of spinal fluid leak ;  Surgeon: Jerrell Belfast, MD;  Location: [facility];  Service: ENT;  Laterality: N/A;  . TRANSPHENOIDAL PITUITARY RESECTION  2006  . VENTRICULOPERITONEAL SHUNT Right 05/31/2019   Procedure: SHUNT INSERTION VENTRICULAR-PERITONEAL; REMOVAL OF LUMBAR DRAIN;  Surgeon: Judith Part, MD;  Location: [facility];  Service: Neurosurgery;  Laterality: Right;  SHUNT INSERTION VENTRICULAR-PERITONEAL; REMOVAL OF LUMBAR DRAIN  . WISDOM TOOTH EXTRACTION      There were no vitals filed for this visit.    Subjective Assessment - 04/03/20 1019    Subjective Pt. reports on days like today with bad weather, her knee is a bit more achy. No other changes since last visit, no falls.    Pertinent History 64 y.o. 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    Currently in Pain? Yes    Pain Score 4     Pain Location Knee    Pain Orientation Left    Pain Descriptors / Indicators Aching    Pain Onset More than a month ago    Pain Frequency Intermittent    Aggravating Factors  beginning to move    Pain Relieving Factors rest    Multiple Pain Sites No             [facility] Adult PT Treatment/Exercise - 04/03/20 1022      Ambulation/Gait   Ambulation/Gait Yes    Ambulation/Gait Assistance 5: Supervision    Ambulation/Gait Assistance Details supervision on outdoor surfaces with gait scanning and hills of sidewalk    Ambulation Distance (Feet) 500 Feet    Assistive device None    Gait Pattern Step-through pattern;Poor foot clearance - left;Poor foot clearance - right    Ambulation Surface Unlevel;Outdoor;Paved    Gait Comments pt's HR increased from 91 at baseline to 110 after ambulation outdoors; rest break was taken to allow for HR to decrease to baseline.             Balance Exercises - 04/03/20 1028      Balance Exercises: Standing   Tandem Stance Eyes open;Eyes closed;Foam/compliant surface;3 reps;30 secs;Limitations    Tandem Stance Time pt. performed one rep of tandem stance on blue mat at countertop with no UE use and Eo; pt. then performed 2 reps of partial tandem stance on blue mat at countertop with EC and no UE support    SLS Foam/compliant surface;Intermittent upper extremity support;3 reps;15 secs;Limitations    SLS Limitations pt. performed SLS at the contertop on the blue mat on RLE only secondary to pain in  the L knee. Pt. required intermitent UE use to balance for the entire 15 seconds.    Tandem Gait Forward;Retro;Upper extremity support;Foam/compliant surface;3 reps;Limitations    Tandem Gait Limitations pt. performed 3 laps on the blue foam beam with 1 UE support on countertop; mirror placed in front of pt. so she could visualize foot placement and weight shifting.     Sidestepping 3 reps;Limitations    Sidestepping Limitations pt. performed 3 laps of  sidestepping on the blue mat with no UE support    Step Over Hurdles / Cones Pt. performed 4 laps of stepping over 4 hurdles with 1 UE assist at the countertop with a reciprocal pattern. Pt. had one episode of LOB with this  that required min. assist by SPTA.    Marching Foam/compliant surface;Intermittent upper extremity assist;Retro;Forwards;Limitations    Marching Limitations pt. performed fwd and retro marching at countertop on blue mat.    Other Standing Exercises pt. performed cone tapping            PT Short Term Goals - 03/23/20 1303      PT SHORT TERM GOAL #1   Title STGs=LTGs            PT Long Term Goals - 03/23/20 1304      PT LONG TERM GOAL #1   Title Pt will increase FGA from 19/30 to 24 or more for improved balance and gait safety.    Baseline 22/30 on 03/19/20    Time 4    Period Weeks    Status On-going    Target Date 04/22/20      PT LONG TERM GOAL #2   Title Pt will increase 6 min walk to >950' without rest break for improved activity tolerance and community mobilty.    Baseline 805' with 1 standing rest break., 865' without rest break on 03/19/20    Time 4    Period Weeks    Status On-going    Target Date 04/22/20               Plan - 04/03/20 1105    Clinical Impression Statement Todays therapy session involved high level balance training as well as gait training outdoors to also improve activity tolerance. The pt. continues to take rest breaks throughout the session secondary to increased HR, but her  HR is returning to baseline quickly. She will benefit from further PT to improve activity tolerance and to continue to address balance deficits/gait abnormalities.    Personal Factors and Comorbidities Comorbidity 3+    Comorbidities T2DM, breast cancer, pituitary tumor s/p resection in 08/2005, bacterial meningitis, VP shunt placement due to hydrocephalus, CSF leak, and ventriculitis    Examination-Activity Limitations Locomotion Level    Examination-Participation Restrictions Community Activity;Driving    Stability/Clinical Decision Making Evolving/Moderate complexity    Rehab Potential Good    PT Frequency 1x / week    PT Duration 4 weeks    PT Treatment/Interventions ADLs/Self Care Home Management;Cryotherapy;Moist Heat;Therapeutic activities;Therapeutic exercise;Balance training;Neuromuscular re-education;Manual techniques;Passive range of motion;Patient/family education;Vestibular    PT Next Visit Plan Continue progressing pt. through balance training and aerobic activity to increase activity tolerance. Continue working on gait on grass, mulch, etc.    Consulted and Agree with Plan of Care Patient           Patient will benefit from skilled therapeutic intervention in order to improve the following deficits and impairments:  Abnormal gait, Decreased activity tolerance, Decreased balance, Impaired vision/preception, Decreased strength  Visit Diagnosis: Other abnormalities of gait and mobility  Muscle weakness (generalized)     Problem List Patient Active Problem List   Diagnosis Date Noted  . Visual disturbance 11/03/2019  . Abnormality of gait 11/03/2019  . Anemia of chronic disease   . CKD (chronic kidney disease), stage II   . Controlled type 2 diabetes mellitus with hyperglycemia, without long-term current use of insulin ([place])   . Benign essential HTN   . 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 S/P VP shunt   . Diabetes mellitus type  2 in obese ([place])   . Acute blood loss anemia   . Elevated BUN 05/12/2019  . Elevated serum creatinine 05/12/2019  . Pituitary  tumor 05/12/2019  . Vitamin D deficiency 05/12/2019  . DJD (degenerative joint disease) 05/12/2019  . 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 Febrile illness   . AKI (acute kidney injury) ([place]) 10/30/2018  . Pituitary adenoma ([place]) 10/30/2018  . Acute bacterial meningitis 10/30/2018  . Sepsis ([place]) 10/30/2018  . Headache 10/29/2018  . Acute encephalopathy 10/29/2018  . Rhabdomyolysis 10/29/2018  . History of pituitary adenoma 10/29/2018  . Glaucoma 10/29/2018  . Atherosclerosis 10/29/2018  . Acute purulent meningitis 10/29/2018  . CKD (chronic kidney disease), stage III 10/29/2018  . Meningitis 10/29/2018  . Fever 10/29/2018  . Malignant neoplasm of lower-outer quadrant of right breast of female, estrogen receptor negative ([place]) 06/09/2016  . Morbid obesity with BMI of 40.0-44.9, adult ([place]) 05/25/2015  . Type II diabetes mellitus, uncontrolled ([place]) 05/25/2015  . Normocytic anemia 04/27/2014  . Hyperlipidemia, unspecified 10/22/2011  . Essential hypertension with goal blood pressure less than 130/85 10/22/2011  . DM (diabetes mellitus) ([place]) 10/22/2011  . HTN (hypertension)   . Diabetes mellitus type II, non insulin dependent ([place])   . Dyslipidemia     Royann Shivers, SPTA 04/03/2020, 11:12 AM  [facility] [address] [place], [place], [address] Phone: [phone]   Fax:  [phone]  Name: Shaqueena Mauceri MRN: [id number] Date of Birth: 1956-02-19

## 2020-04-05 ENCOUNTER — Ambulatory Visit: Payer: BC Managed Care – PPO | Admitting: Physical Therapy

## 2020-04-11 ENCOUNTER — Ambulatory Visit: Payer: BC Managed Care – PPO | Attending: Physical Medicine & Rehabilitation | Admitting: Physical Therapy

## 2020-04-11 ENCOUNTER — Other Ambulatory Visit: Payer: Self-pay

## 2020-04-11 ENCOUNTER — Encounter: Payer: Self-pay | Admitting: Physical Therapy

## 2020-04-11 DIAGNOSIS — M6281 Muscle weakness (generalized): Secondary | ICD-10-CM | POA: Diagnosis present

## 2020-04-11 DIAGNOSIS — R2689 Other abnormalities of gait and mobility: Secondary | ICD-10-CM | POA: Diagnosis not present

## 2020-04-11 NOTE — Therapy (Signed)
[name] [address] [place], [place], [address] Phone: [phone]   Fax:  [phone]  Physical Therapy Treatment  Patient Details  Name: [name] MRN: [id number] Date of Birth: 1956-01-01 Referring Provider (PT): Delice Lesch   Encounter Date: 04/11/2020   PT End of Session - 04/11/20 0938    Visit Number 18    Number of Visits 20    Date for PT Re-Evaluation 05/09/20    PT Start Time 0935    PT Stop Time 1013    PT Time Calculation (min) 38 min    Equipment Utilized During Treatment Gait belt    Activity Tolerance Patient tolerated treatment well;Patient limited by pain    Behavior During Therapy [facility] for tasks assessed/performed           Past Medical History:  Diagnosis Date  . Anemia   . Breast cancer ([place]) 12/29/13   right 6:00 o'clock, lower outer  . Cataract of left eye   . Diabetes mellitus    type 2  . Family history of malignant neoplasm of breast   . Glaucoma    MD just watching, no eye drops  . High cholesterol   . History of blood transfusion 2015   with chemo treatments  . History of radiation therapy 08/12/14- 10/11/14   right breast /50.4 Gy/28 fx, right breast boost/ 10 Gy/ 5 fx  . HTN (hypertension)   . Personal history of chemotherapy   . Personal history of radiation therapy   . Pituitary adenoma ([place]) 10/30/2018  . Wears dentures    full top-partial bottom  . Wears dentures    full  . Wears glasses   . Wears glasses     Past Surgical History:  Procedure Laterality Date  . ABDOMINAL HYSTERECTOMY  1993   endometriosis  . BREAST BIOPSY Left 12/29/2013  . BREAST LUMPECTOMY Right 02/08/2014  . BREAST LUMPECTOMY WITH AXILLARY LYMPH NODE BIOPSY Right 02/08/14   invasive ductal  . CRANIOTOMY N/A 04/15/2019   Procedure: Transphenoid resection of pituitary tumor with fat graft;  Surgeon: Judith Part, MD;  Location: [facility];  Service: Neurosurgery;  Laterality: N/A;   . IR IMAGE GUIDED FLUID DRAIN BY CATHETER  04/19/2019  . MULTIPLE TOOTH EXTRACTIONS    . PLACEMENT OF LUMBAR DRAIN N/A 04/24/2019   Procedure: PLACEMENT OF LUMBAR DRAIN;  Surgeon: Eustace Moore, MD;  Location: [facility];  Service: Neurosurgery;  Laterality: N/A;  . PLACEMENT OF LUMBAR DRAIN N/A 05/16/2019   Procedure: PLACEMENT OF LUMBAR DRAIN;  Surgeon: Newman Pies, MD;  Location: [facility];  Service: Neurosurgery;  Laterality: N/A;  PLACEMENT OF LUMBAR DRAIN  . PORT-A-CATH REMOVAL    . PORTACATH PLACEMENT Left 02/08/2014   Procedure: INSERTION PORT-A-CATH;  Surgeon: Rolm Bookbinder, MD;  Location: [facility];  Service: General;  Laterality: Left;  . REPAIR OF CEREBROSPINAL FLUID LEAK N/A 04/29/2019   Procedure: Endoscopic Endonasal Cerebrospinal Fluid Leak repair, Abdominal wound revision and fat graft harvest, Intrathecal Fluorocein Injection;  Surgeon: Judith Part, MD;  Location: [facility];  Service: Neurosurgery;  Laterality: N/A;  . TONSILLECTOMY    . TRANSNASAL APPROACH N/A 04/15/2019   Procedure: TRANSNASAL APPROACH;  Surgeon: Jerrell Belfast, MD;  Location: [facility];  Service: ENT;  Laterality: N/A;  . TRANSPHENOIDAL APPROACH EXPOSURE N/A 04/15/2019   Procedure: ENDOSCOPIC TRANS NASAL APPROACH WITH FUSIONN;  Surgeon: Jerrell Belfast, MD;  Location: [facility];  Service: ENT;  Laterality: N/A;  .  TRANSPHENOIDAL APPROACH EXPOSURE N/A 04/29/2019   Procedure: Endoscopic repair of spinal fluid leak ;  Surgeon: Jerrell Belfast, MD;  Location: [facility];  Service: ENT;  Laterality: N/A;  . TRANSPHENOIDAL PITUITARY RESECTION  2006  . VENTRICULOPERITONEAL SHUNT Right 05/31/2019   Procedure: SHUNT INSERTION VENTRICULAR-PERITONEAL; REMOVAL OF LUMBAR DRAIN;  Surgeon: Judith Part, MD;  Location: [facility];  Service: Neurosurgery;  Laterality: Right;  SHUNT INSERTION VENTRICULAR-PERITONEAL; REMOVAL OF LUMBAR DRAIN  . WISDOM TOOTH EXTRACTION      There were no vitals filed for this visit.    Subjective Assessment - 04/11/20 0938    Subjective No new complaints. No falls or pain to report. Went to [employer] this weekend and did "a lot" of walking, including up/down stairs.    Pertinent History 64 y.o. 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    Currently in Pain? No/denies    Pain Score 0-No pain                  [facility] Adult PT Treatment/Exercise - 04/11/20 0940      Transfers   Transfers Sit to Stand;Stand to Sit    Sit to Stand 6: Modified independent (Device/Increase time)    Stand to Sit 6: Modified independent (Device/Increase time)      Ambulation/Gait   Ambulation/Gait Yes    Ambulation/Gait Assistance 5: Supervision    Ambulation/Gait Assistance Details HR 89 prior to gait. 104 bpm after gait for 500 feet. Recovered with rest break for < 1 minute.     Ambulation Distance (Feet) 500 Feet   x1   Assistive device None    Gait Pattern Step-through pattern;Poor foot clearance - left;Poor foot clearance - right    Ambulation Surface Level;Unlevel;Indoor;Outdoor;Paved      High Level Balance   High Level Balance Activities Side stepping;Marching forwards;Marching backwards;Tandem walking   tandem/toe walking fwd/bwd   High Level Balance Comments on red/blue mats next to counter top: 3 laps each with intermittent touch to counter for balance. HR 109 after, recovered with rest break. cues needed on posture and ex form/technique. min guard to min assist for balance.                Balance Exercises - 04/11/20 0959      Balance Exercises: Standing   Standing Eyes Closed Narrow base of support (BOS);Wide (BOA);Head turns;Foam/compliant surface;Other reps (comment);Time;Limitations    Standing Eyes Closed Time on open dense blue foam in corner with chair in front for safety: feet together  for EC no head movements, progressing to feet hip width apart for EC head movements left<>right, up<>down. cues for posture/weight shifitng to assist with balance. min guard assist.                PT Short Term Goals - 03/23/20 1303      PT SHORT TERM GOAL #1   Title STGs=LTGs             PT Long Term Goals - 03/23/20 1304      PT LONG TERM GOAL #1   Title Pt will increase FGA from 19/30 to 24 or more for improved balance and gait safety.    Baseline 22/30 on 03/19/20    Time 4    Period Weeks    Status On-going    Target Date  04/22/20      PT LONG TERM GOAL #2   Title Pt will increase 6 min walk to >950' without rest break for improved activity tolerance and community mobilty.    Baseline 805' with 1 standing rest break., 865' without rest break on 03/19/20    Time 4    Period Weeks    Status On-going    Target Date 04/22/20                 Plan - 04/11/20 0939    Clinical Impression Statement Today's skilled session focused on activity tolerance and balance reactions with 2-3 rest breaks to allow HR to decrease after activity. Minimal increase today with activity. The pt is progressing toward goals with less overall assist needed for balance on compliant surfaces.    Personal Factors and Comorbidities Comorbidity 3+    Comorbidities T2DM, breast cancer, pituitary tumor s/p resection in 08/2005, bacterial meningitis, VP shunt placement due to hydrocephalus, CSF leak, and ventriculitis    Examination-Activity Limitations Locomotion Level    Examination-Participation Restrictions Community Activity;Driving    Stability/Clinical Decision Making Evolving/Moderate complexity    Rehab Potential Good    PT Frequency 1x / week    PT Duration 4 weeks    PT Treatment/Interventions ADLs/Self Care Home Management;Cryotherapy;Moist Heat;Therapeutic activities;Therapeutic exercise;Balance training;Neuromuscular re-education;Manual techniques;Passive range of  motion;Patient/family education;Vestibular    PT Next Visit Plan assess LTGs due to end of cert for anticipated discharge    Consulted and Agree with Plan of Care Patient           Patient will benefit from skilled therapeutic intervention in order to improve the following deficits and impairments:  Abnormal gait, Decreased activity tolerance, Decreased balance, Impaired vision/preception, Decreased strength  Visit Diagnosis: Other abnormalities of gait and mobility  Muscle weakness (generalized)     Problem List Patient Active Problem List   Diagnosis Date Noted  . Visual disturbance 11/03/2019  . Abnormality of gait 11/03/2019  . Anemia of chronic disease   . CKD (chronic kidney disease), stage II   . Controlled type 2 diabetes mellitus with hyperglycemia, without long-term current use of insulin ([place])   . Benign essential HTN   . 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 S/P VP shunt   . Diabetes mellitus type 2 in obese ([place])   . Acute blood loss anemia   . Elevated BUN 05/12/2019  . Elevated serum creatinine 05/12/2019  . Pituitary tumor 05/12/2019  . Vitamin D deficiency 05/12/2019  . DJD (degenerative joint disease) 05/12/2019  . 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 Febrile illness   . AKI (acute kidney injury) ([place]) 10/30/2018  . Pituitary adenoma ([place]) 10/30/2018  . Acute bacterial meningitis 10/30/2018  . Sepsis ([place]) 10/30/2018  . Headache 10/29/2018  . Acute encephalopathy 10/29/2018  . Rhabdomyolysis 10/29/2018  . History of pituitary adenoma 10/29/2018  . Glaucoma 10/29/2018  . Atherosclerosis 10/29/2018  . Acute purulent meningitis 10/29/2018  . CKD (chronic kidney disease), stage III 10/29/2018  . Meningitis 10/29/2018  . Fever 10/29/2018  . Malignant neoplasm of lower-outer quadrant of right breast of female, estrogen receptor negative ([place]) 06/09/2016  . Morbid obesity with BMI of  40.0-44.9, adult ([place]) 05/25/2015  . Type II diabetes mellitus, uncontrolled ([place]) 05/25/2015  . Normocytic anemia 04/27/2014  . Hyperlipidemia, unspecified 10/22/2011  . Essential hypertension with goal blood pressure less than 130/85 10/22/2011  . DM (diabetes mellitus) ([place]) 10/22/2011  .  HTN (hypertension)   . Diabetes mellitus type II, non insulin dependent ([place])   . Dyslipidemia     Willow Ora, PTA, [facility] [address], [place], [place] [address] [phone] 04/11/20, 5:08 PM   Name: Yusra Ravert MRN: [id number] Date of Birth: 1956-08-24

## 2020-04-16 ENCOUNTER — Ambulatory Visit: Payer: BC Managed Care – PPO

## 2020-04-17 ENCOUNTER — Ambulatory Visit: Payer: BC Managed Care – PPO

## 2020-04-17 ENCOUNTER — Other Ambulatory Visit: Payer: Self-pay

## 2020-04-17 DIAGNOSIS — R2689 Other abnormalities of gait and mobility: Secondary | ICD-10-CM

## 2020-04-17 DIAGNOSIS — M6281 Muscle weakness (generalized): Secondary | ICD-10-CM

## 2020-04-17 NOTE — Therapy (Signed)
[facility] [address] [place] [employer] [place], [place], [address] Phone: [phone]   Fax:  [phone]  Physical Therapy Treatment/ Discharge summary  Patient Details  Name: [name] MRN: [id number] Date of Birth: May 30, 1956 Referring Provider (PT): Delice Lesch  PHYSICAL THERAPY DISCHARGE SUMMARY  Visits from Start of Care: 19  Current functional level related to goals / functional outcomes: Pt is mod I with gait on varied surfaces with use of SPC and independent without AD on level surfaces. She has some chronic left knee pain that bothers her at times which is main reason she continues to use cane. PT discharging as plateaued with further progress towards last 2 goals at this time.    Remaining deficits: Medium fall risk, fatigues with longer gait distances, chronic left knee pain.   Education / Equipment: HEP  Plan: Patient agrees to discharge.  Patient goals were not met. Patient is being discharged due to                                                     ????PT discharging as plateaued with further progress towards last 2 goals at this time. ?        Encounter Date: 04/17/2020   PT End of Session - 04/17/20 1535    Visit Number 19    Number of Visits 20    Date for PT Re-Evaluation 05/09/20    PT Start Time 1532    PT Stop Time 2951   discharge visit so finished early.   PT Time Calculation (min) 26 min    Equipment Utilized During Treatment Gait belt    Activity Tolerance Patient tolerated treatment well;Patient limited by pain    Behavior During Therapy WFL for tasks assessed/performed           Past Medical History:  Diagnosis Date  . Anemia   . Breast cancer ([place]) 12/29/13   right 6:00 o'clock, lower outer  . Cataract of left eye   . Diabetes mellitus    type 2  . Family history of malignant neoplasm of breast   . Glaucoma    MD just watching, no eye drops  . High cholesterol   .  History of blood transfusion 2015   with chemo treatments  . History of radiation therapy 08/12/14- 10/11/14   right breast /50.4 Gy/28 fx, right breast boost/ 10 Gy/ 5 fx  . HTN (hypertension)   . Personal history of chemotherapy   . Personal history of radiation therapy   . Pituitary adenoma ([facility]) 10/30/2018  . Wears dentures    full top-partial bottom  . Wears dentures    full  . Wears glasses   . Wears glasses     Past Surgical History:  Procedure Laterality Date  . ABDOMINAL HYSTERECTOMY  1993   endometriosis  . BREAST BIOPSY Left 12/29/2013  . BREAST LUMPECTOMY Right 02/08/2014  . BREAST LUMPECTOMY WITH AXILLARY LYMPH NODE BIOPSY Right 02/08/14   invasive ductal  . CRANIOTOMY N/A 04/15/2019   Procedure: Transphenoid resection of pituitary tumor with fat graft;  Surgeon: Judith Part, MD;  Location: [facility];  Service: Neurosurgery;  Laterality: N/A;  . IR IMAGE GUIDED FLUID DRAIN BY CATHETER  04/19/2019  . MULTIPLE TOOTH EXTRACTIONS    . PLACEMENT OF LUMBAR DRAIN N/A 04/24/2019  Procedure: PLACEMENT OF LUMBAR DRAIN;  Surgeon: Eustace Moore, MD;  Location: [facility];  Service: Neurosurgery;  Laterality: N/A;  . PLACEMENT OF LUMBAR DRAIN N/A 05/16/2019   Procedure: PLACEMENT OF LUMBAR DRAIN;  Surgeon: Newman Pies, MD;  Location: [facility];  Service: Neurosurgery;  Laterality: N/A;  PLACEMENT OF LUMBAR DRAIN  . PORT-A-CATH REMOVAL    . PORTACATH PLACEMENT Left 02/08/2014   Procedure: INSERTION PORT-A-CATH;  Surgeon: Rolm Bookbinder, MD;  Location: [facility];  Service: General;  Laterality: Left;  . REPAIR OF CEREBROSPINAL FLUID LEAK N/A 04/29/2019   Procedure: Endoscopic Endonasal Cerebrospinal Fluid Leak repair, Abdominal wound revision and fat graft harvest, Intrathecal Fluorocein Injection;  Surgeon: Judith Part, MD;  Location: [facility];  Service: Neurosurgery;  Laterality: N/A;  . TONSILLECTOMY    . TRANSNASAL APPROACH N/A 04/15/2019   Procedure: TRANSNASAL  APPROACH;  Surgeon: Jerrell Belfast, MD;  Location: [facility];  Service: ENT;  Laterality: N/A;  . TRANSPHENOIDAL APPROACH EXPOSURE N/A 04/15/2019   Procedure: ENDOSCOPIC TRANS NASAL APPROACH WITH FUSIONN;  Surgeon: Jerrell Belfast, MD;  Location: [facility];  Service: ENT;  Laterality: N/A;  . TRANSPHENOIDAL APPROACH EXPOSURE N/A 04/29/2019   Procedure: Endoscopic repair of spinal fluid leak ;  Surgeon: Jerrell Belfast, MD;  Location: [facility];  Service: ENT;  Laterality: N/A;  . TRANSPHENOIDAL PITUITARY RESECTION  2006  . VENTRICULOPERITONEAL SHUNT Right 05/31/2019   Procedure: SHUNT INSERTION VENTRICULAR-PERITONEAL; REMOVAL OF LUMBAR DRAIN;  Surgeon: Judith Part, MD;  Location: [facility];  Service: Neurosurgery;  Laterality: Right;  SHUNT INSERTION VENTRICULAR-PERITONEAL; REMOVAL OF LUMBAR DRAIN  . WISDOM TOOTH EXTRACTION      There were no vitals filed for this visit.   Subjective Assessment - 04/17/20 1535    Subjective Pt reports that she is doing well. She did a lot of walking at a wedding this weekend.    Pertinent History 64 y.o. 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    Currently in Pain? No/denies              [facility] PT Assessment - 04/17/20 1537      6 Minute Walk- Baseline   6 Minute Walk- Baseline yes    HR (bpm) 92      6 Minute walk- Post Test   6 Minute Walk Post Test yes    HR (bpm) 116    Modified Borg Scale for Dyspnea 5- Strong or hard breathing      6 minute walk test results    Aerobic Endurance Distance Walked 835    Endurance additional comments Pt took 1 brief standing rest break. Has trendelenberg right but reports left knee is knee that bothers her.       Functional Gait  Assessment   Gait assessed  Yes    Gait Level Surface Walks 20 ft in less than 7 sec but greater than 5.5 sec,  uses assistive device, slower speed, mild gait deviations, or deviates 6-10 in outside of the 12 in walkway width.    Change in Gait Speed Able to change speed, demonstrates mild gait deviations, deviates 6-10 in outside of the 12 in walkway width, or no gait deviations, unable to achieve a major change in velocity, or uses a change in velocity, or uses an assistive device.    Gait  with Horizontal Head Turns Performs head turns smoothly with no change in gait. Deviates no more than 6 in outside 12 in walkway width    Gait with Vertical Head Turns Performs head turns with no change in gait. Deviates no more than 6 in outside 12 in walkway width.    Gait and Pivot Turn Pivot turns safely within 3 sec and stops quickly with no loss of balance.    Step Over Obstacle Is able to step over one shoe box (4.5 in total height) without changing gait speed. No evidence of imbalance.    Gait with Narrow Base of Support Is able to ambulate for 10 steps heel to toe with no staggering.    Gait with Eyes Closed Walks 20 ft, uses assistive device, slower speed, mild gait deviations, deviates 6-10 in outside 12 in walkway width. Ambulates 20 ft in less than 9 sec but greater than 7 sec.    Ambulating Backwards Walks 20 ft, uses assistive device, slower speed, mild gait deviations, deviates 6-10 in outside 12 in walkway width.    Steps Two feet to a stair, must use rail.    Total Score 23                         [facility] Adult PT Treatment/Exercise - 04/17/20 1537      Ambulation/Gait   Ambulation/Gait Yes    Ambulation/Gait Assistance 7: Independent    Ambulation/Gait Assistance Details during 6 min walk.    Ambulation Distance (Feet) 835 Feet    Assistive device None    Gait Pattern Step-through pattern;Trendelenburg    Ambulation Surface Level;Indoor                  PT Education - 04/17/20 1600    Education Details Discharge today as planned    Person(s) Educated Patient    Methods  Explanation    Comprehension Verbalized understanding            PT Short Term Goals - 03/23/20 1303      PT SHORT TERM GOAL #1   Title STGs=LTGs             PT Long Term Goals - 04/17/20 1545      PT LONG TERM GOAL #1   Title Pt will increase FGA from 19/30 to 24 or more for improved balance and gait safety.    Baseline 22/30 on 03/19/20, 23/30 on FGA    Time 4    Period Weeks    Status Partially Met      PT LONG TERM GOAL #2   Title Pt will increase 6 min walk to >950' without rest break for improved activity tolerance and community mobilty.    Baseline 805' with 1 standing rest break., 865' without rest break on 03/19/20, 835' on 04/17/20    Time 4    Period Weeks    Status On-going                 Plan - 04/17/20 1601    Clinical Impression Statement PT reassessed LTGs today at discharge visit. Pt has plateaued with further progress at this time. She is medium fall risk based on FGA score of 23/30 but mostly due to some chronic left knee pain which decreased score some. Pt was not able to increase 6 min walk distance further but is able to complete without AD. Pt using SPC more when out of house at this  time. PT discharging to home program as planned.    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    Consulted and Agree with Plan of Care Patient           Patient will benefit from skilled therapeutic intervention in order to improve the following deficits and impairments:  Abnormal gait, Decreased activity tolerance, Decreased balance, Impaired vision/preception, Decreased strength  Visit Diagnosis: Other abnormalities of gait and mobility  Muscle weakness (generalized)     Problem List Patient Active Problem List   Diagnosis Date Noted  . Visual disturbance 11/03/2019  . Abnormality of gait 11/03/2019  . Anemia of chronic disease   . CKD (chronic kidney disease), stage II   . Controlled type 2 diabetes mellitus with hyperglycemia, without long-term current use of insulin ([facility])   . Benign essential HTN   . 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 S/P VP shunt   . Diabetes mellitus type 2 in obese ([place])   . Acute blood loss anemia   . Elevated BUN 05/12/2019  . Elevated serum creatinine 05/12/2019  . Pituitary tumor 05/12/2019  . Vitamin D deficiency 05/12/2019  . DJD (degenerative joint disease) 05/12/2019  . 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 Febrile illness   . AKI (acute kidney injury) ([place]) 10/30/2018  . Pituitary adenoma ([facility]) 10/30/2018  . Acute bacterial meningitis 10/30/2018  . Sepsis ([place]) 10/30/2018  . Headache 10/29/2018  . Acute encephalopathy 10/29/2018  . Rhabdomyolysis 10/29/2018  . History of pituitary adenoma 10/29/2018  . Glaucoma 10/29/2018  . Atherosclerosis 10/29/2018  . Acute purulent meningitis 10/29/2018  . CKD (chronic kidney disease), stage III 10/29/2018  . Meningitis 10/29/2018  . Fever 10/29/2018  . Malignant neoplasm of lower-outer quadrant of right breast of female, estrogen receptor negative ([place]) 06/09/2016  . Morbid obesity with BMI of 40.0-44.9, adult ([place] [facility]) 05/25/2015  . Type II diabetes mellitus,  uncontrolled ([facility]) 05/25/2015  . Normocytic anemia 04/27/2014  . Hyperlipidemia, unspecified 10/22/2011  . Essential hypertension with goal blood pressure less than 130/85 10/22/2011  . DM (diabetes mellitus) ([facility]) 10/22/2011  . HTN (hypertension)   . Diabetes mellitus type II, non insulin dependent ([place])   . Dyslipidemia     Electa Sniff, PT, DPT, NCS 04/17/2020, 4:04 PM  [facility] [address] [place], [place], [address] Phone: [phone]   Fax:  [phone]  Name: [name] MRN: [id number] Date of Birth: 30-Jan-1956

## 2020-04-18 ENCOUNTER — Ambulatory Visit
Admission: RE | Admit: 2020-04-18 | Discharge: 2020-04-18 | Disposition: A | Discharge status: Home / Self Care | Payer: BC Managed Care – PPO | Source: Ambulatory Visit | Attending: Family Medicine | Admitting: Family Medicine

## 2020-04-18 DIAGNOSIS — R921 Mammographic calcification found on diagnostic imaging of breast: Secondary | ICD-10-CM

## 2020-04-18 DIAGNOSIS — Z853 Personal history of malignant neoplasm of breast: Secondary | ICD-10-CM

## 2020-05-17 ENCOUNTER — Encounter
Payer: BC Managed Care – PPO | Attending: Physical Medicine & Rehabilitation | Admitting: Physical Medicine & Rehabilitation

## 2020-05-17 ENCOUNTER — Encounter: Payer: Self-pay | Admitting: Physical Medicine & Rehabilitation

## 2020-05-17 ENCOUNTER — Other Ambulatory Visit: Payer: Self-pay

## 2020-05-17 VITALS — BP 138/84 | HR 95 | Temp 98.7°F | Ht 66.0 in | Wt 240.8 lb

## 2020-05-17 DIAGNOSIS — G0490 Encephalitis and encephalomyelitis, unspecified: Secondary | ICD-10-CM | POA: Diagnosis present

## 2020-05-17 DIAGNOSIS — H539 Unspecified visual disturbance: Secondary | ICD-10-CM

## 2020-05-17 DIAGNOSIS — R269 Unspecified abnormalities of gait and mobility: Secondary | ICD-10-CM

## 2020-05-17 NOTE — Progress Notes (Signed)
Subjective:    Patient ID: [name], female    DOB: 1956-02-11, 64 y.o.   MRN: [id number]  [id number] [name] is a 64 y.o. 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  Last clinic visit on 03/27/2020.  Since that time, patient states she "took a break" from therapies, but is ready to go back.  She continues to follow up with Neurosurg. She is now living at her own place and has accommodated. She notes improvement in thinking.  Denies falls. Left eye deficits persist.   Pain Inventory Average Pain 8 Pain Right Now 5 My pain is intermittent and aching  In the last 24 hours, has pain interfered with the following? General activity 2 Relation with others 0 Enjoyment of life 4 What TIME of day is your pain at its worst? evening Sleep (in general) Fair  Pain is worse with: inactivity Pain improves with: therapy/exercise Relief from Meds: na    Family History  Problem Relation Age of Onset  . Hypertension Mother   . Diabetes type II Mother   . Breast cancer Mother        dx ~93; deceased 51  . Hypertension Father   . Breast cancer Maternal Aunt        deceased 25  . Cancer Maternal Uncle        unk. type; deceased 59s  . Cancer Maternal Uncle        unk. type; deceased late 21s  . Uterine cancer Cousin 52       daughter of an unaffected mat aunt who is 64   Social History   Socioeconomic History  . Marital status: Widowed    Spouse name: Not on file  . Number of children: Not on file  . Years of education: Not on file  . Highest education level: Not on file  Occupational History  . Not on file  Tobacco Use  . Smoking status: Former Smoker    Types: Cigarettes    Quit date: 01/31/1994    Years since quitting: 26.3  . Smokeless tobacco: Never Used  Vaping Use  . Vaping Use: Never used  Substance  and Sexual Activity  . Alcohol use: Not Currently    Comment: occasional wine  . Drug use: No  . Sexual activity: Not on file    Comment: menarche age 8, first live birth age 56, P48, TAH-BSO age 55   Other Topics Concern  . Not on file  Social History Narrative  . Not on file   Social Determinants of Health   Financial Resource Strain:   . Difficulty of Paying Living Expenses: Not on file  Food Insecurity:   . Worried About Charity fundraiser in the Last Year: Not on file  . Ran Out of Food in the Last Year: Not on file  Transportation Needs:   . Lack of Transportation (Medical): Not on file  . Lack of Transportation (Non-Medical): Not on file  Physical Activity:   . Days of Exercise per Week: Not on file  . Minutes of Exercise per Session: Not on file  Stress:   . Feeling of Stress : Not on file  Social Connections:   . Frequency of Communication with Friends and Family: Not on file  . Frequency of Social Gatherings with Friends and Family: Not on file  . Attends Religious  Services: Not on file  . Active Member of Clubs or Organizations: Not on file  . Attends Archivist Meetings: Not on file  . Marital Status: Not on file   Past Surgical History:  Procedure Laterality Date  . ABDOMINAL HYSTERECTOMY  1993   endometriosis  . BREAST BIOPSY Left 12/29/2013  . BREAST LUMPECTOMY Right 02/08/2014  . BREAST LUMPECTOMY WITH AXILLARY LYMPH NODE BIOPSY Right 02/08/14   invasive ductal  . CRANIOTOMY N/A 04/15/2019   Procedure: Transphenoid resection of pituitary tumor with fat graft;  Surgeon: Judith Part, MD;  Location: [facility];  Service: Neurosurgery;  Laterality: N/A;  . IR IMAGE GUIDED FLUID DRAIN BY CATHETER  04/19/2019  . MULTIPLE TOOTH EXTRACTIONS    . PLACEMENT OF LUMBAR DRAIN N/A 04/24/2019   Procedure: PLACEMENT OF LUMBAR DRAIN;  Surgeon: Eustace Moore, MD;  Location: [facility];  Service: Neurosurgery;  Laterality: N/A;  . PLACEMENT OF LUMBAR DRAIN N/A  05/16/2019   Procedure: PLACEMENT OF LUMBAR DRAIN;  Surgeon: Newman Pies, MD;  Location: [facility];  Service: Neurosurgery;  Laterality: N/A;  PLACEMENT OF LUMBAR DRAIN  . PORT-A-CATH REMOVAL    . PORTACATH PLACEMENT Left 02/08/2014   Procedure: INSERTION PORT-A-CATH;  Surgeon: Rolm Bookbinder, MD;  Location: [facility];  Service: General;  Laterality: Left;  . REPAIR OF CEREBROSPINAL FLUID LEAK N/A 04/29/2019   Procedure: Endoscopic Endonasal Cerebrospinal Fluid Leak repair, Abdominal wound revision and fat graft harvest, Intrathecal Fluorocein Injection;  Surgeon: Judith Part, MD;  Location: [facility];  Service: Neurosurgery;  Laterality: N/A;  . TONSILLECTOMY    . TRANSNASAL APPROACH N/A 04/15/2019   Procedure: TRANSNASAL APPROACH;  Surgeon: Jerrell Belfast, MD;  Location: [facility];  Service: ENT;  Laterality: N/A;  . TRANSPHENOIDAL APPROACH EXPOSURE N/A 04/15/2019   Procedure: ENDOSCOPIC TRANS NASAL APPROACH WITH FUSIONN;  Surgeon: Jerrell Belfast, MD;  Location: [facility];  Service: ENT;  Laterality: N/A;  . TRANSPHENOIDAL APPROACH EXPOSURE N/A 04/29/2019   Procedure: Endoscopic repair of spinal fluid leak ;  Surgeon: Jerrell Belfast, MD;  Location: [facility];  Service: ENT;  Laterality: N/A;  . TRANSPHENOIDAL PITUITARY RESECTION  2006  . VENTRICULOPERITONEAL SHUNT Right 05/31/2019   Procedure: SHUNT INSERTION VENTRICULAR-PERITONEAL; REMOVAL OF LUMBAR DRAIN;  Surgeon: Judith Part, MD;  Location: [facility];  Service: Neurosurgery;  Laterality: Right;  SHUNT INSERTION VENTRICULAR-PERITONEAL; REMOVAL OF LUMBAR DRAIN  . WISDOM TOOTH EXTRACTION     Past Medical History:  Diagnosis Date  . Anemia   . Breast cancer ([facility]) 12/29/13   right 6:00 o'clock, lower outer  . Cataract of left eye   . Diabetes mellitus    type 2  . Family history of malignant neoplasm of breast   . Glaucoma    MD just watching, no eye drops  . High cholesterol   . History of blood transfusion 2015   with  chemo treatments  . History of radiation therapy 08/12/14- 10/11/14   right breast /50.4 Gy/28 fx, right breast boost/ 10 Gy/ 5 fx  . HTN (hypertension)   . Personal history of chemotherapy   . Personal history of radiation therapy   . Pituitary adenoma ([facility]) 10/30/2018  . Wears dentures    full top-partial bottom  . Wears dentures    full  . Wears glasses   . Wears glasses    BP 138/84   Pulse 95   Temp 98.7 F (37.1 C)   Ht 5\' 6"  (1.676 m)  Wt 240 lb 12.8 oz (109.2 kg)   LMP  (LMP Unknown)   SpO2 97%   BMI 38.87 kg/m   Opioid Risk Score:   Fall Risk Score:  `1  Depression screen PHQ 2/9  Depression screen [facility] 2/9 05/17/2020 08/11/2014  Decreased Interest 1 0  Down, Depressed, Hopeless 1 0  PHQ - 2 Score 2 0  Some recent data might be hidden    Review of Systems  Constitutional: Negative.   HENT: Negative.   Eyes: Negative.   Respiratory: Negative.   Cardiovascular: Negative.   Gastrointestinal: Negative.   Endocrine: Negative.   Genitourinary: Negative.   Musculoskeletal: Positive for arthralgias and gait problem.  Skin: Negative.   Allergic/Immunologic: Negative.   Neurological: Positive for weakness and numbness.  Hematological: Negative.   Psychiatric/Behavioral: Negative.   All other systems reviewed and are negative.      Objective:    Constitutional: NAD. Psych: Normal mood.  Normal behavior. Musc: No edema in extremities.  No tenderness in extremities. Gait: Slightly slow cadence Neuro: Alert and oriented, some word finding difficulty, improving Left peripheral field deficits with left eye Motor: 5/5 throughout, unchanged    Assessment & Plan:  Lee Kalt is a 64 y.o. 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  1. 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              Resume outpatient therapies             Continue follow up with Neurosurg             Patient has transitioned to her own home and doing well  Cognition improving - word finding difficulties continue to improve  2. Impaired vision             Left eye deficits persistent  3. Gait abnormality             Resume outpatient PT             Continue cane for safety  Patient would like to return in 3 months

## 2020-05-18 ENCOUNTER — Telehealth: Payer: Self-pay | Admitting: *Deleted

## 2020-05-18 NOTE — Telephone Encounter (Signed)
Mrs Oscar called and reports that someone needs to call the PT place and speak with Kirby Crigler [phone]) before they will reschedule the PT.

## 2020-05-22 NOTE — Telephone Encounter (Signed)
Called and left message for Anderson [place] to call back.

## 2020-05-22 NOTE — Telephone Encounter (Signed)
Spoke to [place] and she call patient to schedule.

## 2020-06-08 ENCOUNTER — Ambulatory Visit: Payer: BC Managed Care – PPO

## 2020-08-16 ENCOUNTER — Other Ambulatory Visit: Payer: Self-pay

## 2020-08-16 ENCOUNTER — Encounter: Payer: Self-pay | Admitting: Physical Medicine & Rehabilitation

## 2020-08-16 ENCOUNTER — Encounter
Payer: BC Managed Care – PPO | Attending: Physical Medicine & Rehabilitation | Admitting: Physical Medicine & Rehabilitation

## 2020-08-16 VITALS — BP 147/79 | HR 91 | Temp 98.4°F | Ht 66.0 in | Wt 242.2 lb

## 2020-08-16 DIAGNOSIS — H539 Unspecified visual disturbance: Secondary | ICD-10-CM

## 2020-08-16 DIAGNOSIS — R269 Unspecified abnormalities of gait and mobility: Secondary | ICD-10-CM | POA: Diagnosis not present

## 2020-08-16 DIAGNOSIS — G0490 Encephalitis and encephalomyelitis, unspecified: Secondary | ICD-10-CM

## 2020-08-16 DIAGNOSIS — I10 Essential (primary) hypertension: Secondary | ICD-10-CM

## 2020-08-16 NOTE — Progress Notes (Signed)
Subjective:    Patient ID: [name], female    DOB: 27-Jun-1956, 64 y.o.   MRN: [id number]  [name] is a 64 y.o. 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  Last clinic visit on 05/17/20.  Since that time, pt states she completed therapies since last visit. She continues to follow up with Neurosurg. She is doing well at her own home.  She feels cognitive deficits are improving. Vision is the same, maybe slightly better. Denies falls, using cane.  She notes pain in left knee, improved with Voltaren gel.   Pain Inventory Average Pain 7 Pain Right Now 7 My pain is intermittent and aching  In the last 24 hours, has pain interfered with the following? General activity 5 Relation with others 3 Enjoyment of life 5 What TIME of day is your pain at its worst? evening Sleep (in general) Fair  Pain is worse with: walking and standing Pain improves with: therapy/exercise and standing Relief from Meds: 7  Family History  Problem Relation Age of Onset  . Hypertension Mother   . Diabetes type II Mother   . Breast cancer Mother        dx ~67; deceased 64  . Hypertension Father   . Breast cancer Maternal Aunt        deceased 49  . Cancer Maternal Uncle        unk. type; deceased 46s  . Cancer Maternal Uncle        unk. type; deceased late 33s  . Uterine cancer Cousin 71       daughter of an unaffected mat aunt who is 63   Social History   Socioeconomic History  . Marital status: Widowed    Spouse name: Not on file  . Number of children: Not on file  . Years of education: Not on file  . Highest education level: Not on file  Occupational History  . Not on file  Tobacco Use  . Smoking status: Former Smoker    Types: Cigarettes    Quit date: 01/31/1994    Years since quitting: 26.5  . Smokeless  tobacco: Never Used  Vaping Use  . Vaping Use: Never used  Substance and Sexual Activity  . Alcohol use: Not Currently    Comment: occasional wine  . Drug use: No  . Sexual activity: Not on file    Comment: menarche age 17, first live birth age 37, P28, TAH-BSO age 44   Other Topics Concern  . Not on file  Social History Narrative  . Not on file   Social Determinants of Health   Financial Resource Strain: Not on file  Food Insecurity: Not on file  Transportation Needs: Not on file  Physical Activity: Not on file  Stress: Not on file  Social Connections: Not on file   Past Surgical History:  Procedure Laterality Date  . ABDOMINAL HYSTERECTOMY  1993   endometriosis  . BREAST BIOPSY Left 12/29/2013  . BREAST LUMPECTOMY Right 02/08/2014  . BREAST LUMPECTOMY WITH AXILLARY LYMPH NODE BIOPSY Right 02/08/14   invasive ductal  . CRANIOTOMY N/A 04/15/2019   Procedure: Transphenoid resection of pituitary tumor with fat graft;  Surgeon: Judith Part, MD;  Location: [place];  Service: Neurosurgery;  Laterality: N/A;  . IR IMAGE GUIDED FLUID DRAIN BY CATHETER  04/19/2019  . MULTIPLE TOOTH EXTRACTIONS    .  PLACEMENT OF LUMBAR DRAIN N/A 04/24/2019   Procedure: PLACEMENT OF LUMBAR DRAIN;  Surgeon: Eustace Moore, MD;  Location: [place];  Service: Neurosurgery;  Laterality: N/A;  . PLACEMENT OF LUMBAR DRAIN N/A 05/16/2019   Procedure: PLACEMENT OF LUMBAR DRAIN;  Surgeon: Newman Pies, MD;  Location: [place];  Service: Neurosurgery;  Laterality: N/A;  PLACEMENT OF LUMBAR DRAIN  . PORT-A-CATH REMOVAL    . PORTACATH PLACEMENT Left 02/08/2014   Procedure: INSERTION PORT-A-CATH;  Surgeon: Rolm Bookbinder, MD;  Location: [place];  Service: General;  Laterality: Left;  . REPAIR OF CEREBROSPINAL FLUID LEAK N/A 04/29/2019   Procedure: Endoscopic Endonasal Cerebrospinal Fluid Leak repair, Abdominal wound revision and fat graft harvest, Intrathecal Fluorocein Injection;  Surgeon:  Judith Part, MD;  Location: [place];  Service: Neurosurgery;  Laterality: N/A;  . TONSILLECTOMY    . TRANSNASAL APPROACH N/A 04/15/2019   Procedure: TRANSNASAL APPROACH;  Surgeon: Jerrell Belfast, MD;  Location: [facility];  Service: ENT;  Laterality: N/A;  . TRANSPHENOIDAL APPROACH EXPOSURE N/A 04/15/2019   Procedure: ENDOSCOPIC TRANS NASAL APPROACH WITH FUSIONN;  Surgeon: Jerrell Belfast, MD;  Location: [place];  Service: ENT;  Laterality: N/A;  . TRANSPHENOIDAL APPROACH EXPOSURE N/A 04/29/2019   Procedure: Endoscopic repair of spinal fluid leak ;  Surgeon: Jerrell Belfast, MD;  Location: [place];  Service: ENT;  Laterality: N/A;  . TRANSPHENOIDAL PITUITARY RESECTION  2006  . VENTRICULOPERITONEAL SHUNT Right 05/31/2019   Procedure: SHUNT INSERTION VENTRICULAR-PERITONEAL; REMOVAL OF LUMBAR DRAIN;  Surgeon: Judith Part, MD;  Location: [place];  Service: Neurosurgery;  Laterality: Right;  SHUNT INSERTION VENTRICULAR-PERITONEAL; REMOVAL OF LUMBAR DRAIN  . WISDOM TOOTH EXTRACTION     Past Medical History:  Diagnosis Date  . Anemia   . Breast cancer ([place]) 12/29/13   right 6:00 o'clock, lower outer  . Cataract of left eye   . Diabetes mellitus    type 2  . Family history of malignant neoplasm of breast   . Glaucoma    MD just watching, no eye drops  . High cholesterol   . History of blood transfusion 2015   with chemo treatments  . History of radiation therapy 08/12/14- 10/11/14   right breast /50.4 Gy/28 fx, right breast boost/ 10 Gy/ 5 fx  . HTN (hypertension)   . Personal history of chemotherapy   . Personal history of radiation therapy   . Pituitary adenoma ([place]) 10/30/2018  . Wears dentures    full top-partial bottom  . Wears dentures    full  . Wears glasses   . Wears glasses    BP (!) 147/79   Pulse 91   Temp 98.4 F (36.9 C)   Ht 5\' 6"  (1.676 m)   Wt 242 lb 3.2 oz (109.9 kg)   LMP  (LMP Unknown)   SpO2 99%   BMI 39.09 kg/m   Opioid Risk Score:   Fall Risk Score:   `1  Depression screen PHQ 2/9  Depression screen [facility] 2/9 08/16/2020 05/17/2020 08/11/2014  Decreased Interest 0 1 0  Down, Depressed, Hopeless 0 1 0  PHQ - 2 Score 0 2 0  Some recent data might be hidden    Review of Systems  Constitutional: Negative.   HENT: Negative.   Eyes: Negative.   Respiratory: Negative.   Cardiovascular: Negative.   Gastrointestinal: Negative.   Endocrine: Negative.   Genitourinary: Negative.   Musculoskeletal: Positive for arthralgias and gait problem.  Skin: Negative.   Allergic/Immunologic:  Negative.   Neurological: Positive for weakness and numbness.  Hematological: Negative.   Psychiatric/Behavioral: Negative.   All other systems reviewed and are negative.      Objective:     Constitutional: No distress . Vital signs reviewed. HENT: Normocephalic.  Atraumatic. Eyes: EOMI. No discharge. Cardiovascular: No JVD.   Respiratory: Normal effort.  No stridor.   GI: Non-distended.   Skin: Warm and dry.  Intact. Psych: Normal mood.  Normal behavior. Musc: Left knee with mild edema, no tenderness. Neuro: Alert and oriented Left peripheral field deficits with left eye, improving Motor:5/5 throughout    Assessment & Plan:  [name] is a 64 y.o. 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  1. 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 Completed therapies, encouraged HEP Continue follow up with Neurosurg Patient has transitioned to her own home and doing well             Cognition continue to improve  2. Impaired vision Left eye deficits,  improving  3. Gait abnormality  Cont HEP Continue cane safety  Patient would like to follow up in 6 months

## 2020-08-30 ENCOUNTER — Ambulatory Visit
Admission: EM | Admit: 2020-08-30 | Discharge: 2020-08-30 | Disposition: A | Discharge status: Home / Self Care | Payer: BC Managed Care – PPO | Attending: Emergency Medicine | Admitting: Emergency Medicine

## 2020-08-30 ENCOUNTER — Other Ambulatory Visit: Payer: Self-pay

## 2020-08-30 DIAGNOSIS — Z1152 Encounter for screening for COVID-19: Secondary | ICD-10-CM

## 2020-08-30 DIAGNOSIS — J069 Acute upper respiratory infection, unspecified: Secondary | ICD-10-CM | POA: Diagnosis not present

## 2020-08-30 DIAGNOSIS — J040 Acute laryngitis: Secondary | ICD-10-CM | POA: Diagnosis not present

## 2020-08-30 MED ORDER — BENZONATATE 100 MG PO CAPS: 100.0000 mg | ORAL_CAPSULE | Freq: Three times a day (TID) | ORAL | 0 refills | Status: AC

## 2020-08-30 NOTE — Discharge Instructions (Addendum)
Please continue Tylenol and/or Ibuprofen as needed for fever, pain.  May try warm salt water gargles, cepacol lozenges, throat spray, warm tea or water with lemon/honey, or OTC cold relief medicine for throat discomfort.   For congestion: take a daily anti-histamine like Zyrtec, Claritin, and a oral decongestant to help with post nasal drip that may be irritating your throat.   It is important to stay hydrated: drink plenty of fluids (primarily water) to keep your throat moisturized and help further relieve irritation/discomfort. 

## 2020-08-30 NOTE — ED Provider Notes (Signed)
[facility]    CSN: [id number] Arrival date & time: 08/30/20  1615      History   Chief Complaint Chief Complaint  Patient presents with  . Sore Throat    Since yesterday  . Nasal Congestion    Since yesterday  . Cough    Mild since yesterday    HPI [name] is a 64 y.o. female  Presenting for 2-day course of mild sore throat, laryngitis, nasal congestion and dry cough.  No other symptoms at this time.  No known sick exposures, though did go to a wedding in [place] the other weekend.  Questing Covid testing.  Denies smoking, alcohol use.  Past Medical History:  Diagnosis Date  . Anemia   . Breast cancer ([place]) 12/29/13   right 6:00 o'clock, lower outer  . Cataract of left eye   . Diabetes mellitus    type 2  . Family history of malignant neoplasm of breast   . Glaucoma    MD just watching, no eye drops  . High cholesterol   . History of blood transfusion 2015   with chemo treatments  . History of radiation therapy 08/12/14- 10/11/14   right breast /50.4 Gy/28 fx, right breast boost/ 10 Gy/ 5 fx  . HTN (hypertension)   . Personal history of chemotherapy   . Personal history of radiation therapy   . Pituitary adenoma ([place]) 10/30/2018  . Wears dentures    full top-partial bottom  . Wears dentures    full  . Wears glasses   . Wears glasses     Patient Active Problem List   Diagnosis Date Noted  . Visual disturbance 11/03/2019  . Abnormality of gait 11/03/2019  . Anemia of chronic disease   . CKD (chronic kidney disease), stage II   . Controlled type 2 diabetes mellitus with hyperglycemia, without long-term current use of insulin ([place])   . Benign essential HTN   . 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 S/P VP shunt   . Diabetes mellitus type 2 in obese ([place])   . Acute blood loss anemia   . Elevated BUN 05/12/2019  . Elevated serum creatinine 05/12/2019  . Pituitary tumor 05/12/2019  . Vitamin D  deficiency 05/12/2019  . DJD (degenerative joint disease) 05/12/2019  . 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 Febrile illness   . AKI (acute kidney injury) ([place]) 10/30/2018  . Pituitary adenoma ([place]) 10/30/2018  . Acute bacterial meningitis 10/30/2018  . Sepsis ([place]) 10/30/2018  . Headache 10/29/2018  . Acute encephalopathy 10/29/2018  . Rhabdomyolysis 10/29/2018  . History of pituitary adenoma 10/29/2018  . Glaucoma 10/29/2018  . Atherosclerosis 10/29/2018  . Acute purulent meningitis 10/29/2018  . CKD (chronic kidney disease), stage III ([place]) 10/29/2018  . Meningitis 10/29/2018  . Fever 10/29/2018  . Malignant neoplasm of lower-outer quadrant of right breast of female, estrogen receptor negative ([place]) 06/09/2016  . Morbid obesity with BMI of 40.0-44.9, adult ([place]) 05/25/2015  . Type II diabetes mellitus, uncontrolled ([place]) 05/25/2015  . Normocytic anemia 04/27/2014  . Hyperlipidemia, unspecified 10/22/2011  . Essential hypertension with goal blood pressure less than 130/85 10/22/2011  . DM (diabetes mellitus) ([place]) 10/22/2011  . HTN (hypertension)   . Diabetes mellitus type II, non insulin dependent ([place])   . Dyslipidemia     Past Surgical History:  Procedure Laterality Date  . ABDOMINAL HYSTERECTOMY  1993   endometriosis  . BREAST BIOPSY  Left 12/29/2013  . BREAST LUMPECTOMY Right 02/08/2014  . BREAST LUMPECTOMY WITH AXILLARY LYMPH NODE BIOPSY Right 02/08/14   invasive ductal  . CRANIOTOMY N/A 04/15/2019   Procedure: Transphenoid resection of pituitary tumor with fat graft;  Surgeon: Judith Part, MD;  Location: [place];  Service: Neurosurgery;  Laterality: N/A;  . IR IMAGE GUIDED FLUID DRAIN BY CATHETER  04/19/2019  . MULTIPLE TOOTH EXTRACTIONS    . PLACEMENT OF LUMBAR DRAIN N/A 04/24/2019   Procedure: PLACEMENT OF LUMBAR DRAIN;  Surgeon: Eustace Moore, MD;  Location: [place];  Service: Neurosurgery;  Laterality: N/A;  . PLACEMENT OF LUMBAR DRAIN  N/A 05/16/2019   Procedure: PLACEMENT OF LUMBAR DRAIN;  Surgeon: Newman Pies, MD;  Location: [place];  Service: Neurosurgery;  Laterality: N/A;  PLACEMENT OF LUMBAR DRAIN  . PORT-A-CATH REMOVAL    . PORTACATH PLACEMENT Left 02/08/2014   Procedure: INSERTION PORT-A-CATH;  Surgeon: Rolm Bookbinder, MD;  Location: [place];  Service: General;  Laterality: Left;  . REPAIR OF CEREBROSPINAL FLUID LEAK N/A 04/29/2019   Procedure: Endoscopic Endonasal Cerebrospinal Fluid Leak repair, Abdominal wound revision and fat graft harvest, Intrathecal Fluorocein Injection;  Surgeon: Judith Part, MD;  Location: [place];  Service: Neurosurgery;  Laterality: N/A;  . TONSILLECTOMY    . TRANSNASAL APPROACH N/A 04/15/2019   Procedure: TRANSNASAL APPROACH;  Surgeon: Jerrell Belfast, MD;  Location: [place];  Service: ENT;  Laterality: N/A;  . TRANSPHENOIDAL APPROACH EXPOSURE N/A 04/15/2019   Procedure: ENDOSCOPIC TRANS NASAL APPROACH WITH FUSIONN;  Surgeon: Jerrell Belfast, MD;  Location: [place];  Service: ENT;  Laterality: N/A;  . TRANSPHENOIDAL APPROACH EXPOSURE N/A 04/29/2019   Procedure: Endoscopic repair of spinal fluid leak ;  Surgeon: Jerrell Belfast, MD;  Location: [place];  Service: ENT;  Laterality: N/A;  . TRANSPHENOIDAL PITUITARY RESECTION  2006  . VENTRICULOPERITONEAL SHUNT Right 05/31/2019   Procedure: SHUNT INSERTION VENTRICULAR-PERITONEAL; REMOVAL OF LUMBAR DRAIN;  Surgeon: Judith Part, MD;  Location: [place];  Service: Neurosurgery;  Laterality: Right;  SHUNT INSERTION VENTRICULAR-PERITONEAL; REMOVAL OF LUMBAR DRAIN  . WISDOM TOOTH EXTRACTION      OB History   No obstetric history on file.      Home Medications    Prior to Admission medications   Medication Sig Start Date End Date Taking? Authorizing Provider  acetaminophen (TYLENOL) 325 MG tablet Take 1-2 tablets (325-650 mg total) by mouth every 4 (four) hours as needed for mild pain. 06/13/19  Yes Love, Ivan Anchors, PA-C   amLODipine (NORVASC) 10 MG tablet Take 1 tablet (10 mg total) by mouth daily. 06/21/19  Yes Love, Ivan Anchors, PA-C  atorvastatin (LIPITOR) 40 MG tablet Take 1 tablet (40 mg total) by mouth daily. 06/21/19  Yes Love, Ivan Anchors, PA-C  FERREX 150 150 MG capsule Take 1 capsule (150 mg total) by mouth 2 (two) times daily. 06/21/19  Yes Love, Ivan Anchors, PA-C  gabapentin (NEURONTIN) 300 MG capsule Take 1 capsule (300 mg total) by mouth 2 (two) times daily. 06/21/19  Yes Love, Ivan Anchors, PA-C  glucose monitoring kit (FREESTYLE) monitoring kit One Touch Ultra machine and strips, patient tests her blood sugars bid 04/28/14  Yes [provider]  glyBURIDE (DIABETA) 2.5 MG tablet Take 1 tablet (2.5 mg total) by mouth daily with breakfast. 06/22/19  Yes Love, Ivan Anchors, PA-C  latanoprost (XALATAN) 0.005 % ophthalmic solution Place 1 drop into both eyes at bedtime. 04/04/20  Yes [provider]  metFORMIN (  GLUCOPHAGE-XR) 500 MG 24 hr tablet Take 1,000 mg by mouth every morning. 04/29/20  Yes [provider]  ONE TOUCH ULTRA TEST test strip  03/16/14  Yes [provider]  pantoprazole (PROTONIX) 40 MG tablet Take 1 tablet (40 mg total) by mouth daily. 06/22/19  Yes Love, Ivan Anchors, PA-C  Prenatal Vit-Fe Fumarate-FA (MULTIVITAMIN-PRENATAL) 27-0.8 MG TABS tablet Take 1 tablet by mouth daily. 04/24/20  Yes [provider]  sodium chloride (OCEAN) 0.65 % SOLN nasal spray Place 4 sprays into both nostrils as needed for congestion. 06/13/19  Yes Love, Ivan Anchors, PA-C  Vitamin D, Ergocalciferol, (DRISDOL) 1.25 MG (50000 UT) CAPS capsule Take 50,000 Units by mouth every Sunday. 09/23/18  Yes [provider]  benzonatate (TESSALON) 100 MG capsule Take 1 capsule (100 mg total) by mouth every 8 (eight) hours. 08/30/20   Hall-Potvin, [place], PA-C  senna-docusate (SENOKOT-S) 8.6-50 MG tablet Take 2 tablets by mouth 2 (two) times daily. For constipation 06/21/19   Love, Ivan Anchors, PA-C     Family History Family History  Problem Relation Age of Onset  . Hypertension Mother   . Diabetes type II Mother   . Breast cancer Mother        dx ~52; deceased 77  . Hypertension Father   . Breast cancer Maternal Aunt        deceased 12  . Cancer Maternal Uncle        unk. type; deceased 52s  . Cancer Maternal Uncle        unk. type; deceased late 56s  . Uterine cancer Cousin 96       daughter of an unaffected mat aunt who is 32    Social History Social History   Tobacco Use  . Smoking status: Former Smoker    Types: Cigarettes    Quit date: 01/31/1994    Years since quitting: 26.5  . Smokeless tobacco: Never Used  Vaping Use  . Vaping Use: Never used  Substance Use Topics  . Alcohol use: Not Currently    Comment: occasional wine  . Drug use: No     Allergies   Patient has no known allergies.   Review of Systems Review of Systems  Constitutional: Negative for fatigue and fever.  HENT: Positive for congestion, sore throat and voice change. Negative for dental problem, ear pain, facial swelling, hearing loss, sinus pain and trouble swallowing.   Eyes: Negative for photophobia, pain and visual disturbance.  Respiratory: Positive for cough. Negative for shortness of breath and wheezing.   Cardiovascular: Negative for chest pain and palpitations.  Gastrointestinal: Negative for diarrhea and vomiting.  Musculoskeletal: Negative for arthralgias and myalgias.  Neurological: Negative for dizziness and headaches.     Physical Exam Triage Vital Signs ED Triage Vitals  Enc Vitals Group     BP 08/30/20 1721 (!) 150/90     Pulse Rate 08/30/20 1721 93     Resp 08/30/20 1721 20     Temp 08/30/20 1721 98 F (36.7 C)     Temp Source 08/30/20 1721 Oral     SpO2 08/30/20 1721 96 %     Weight --      Height --      Head Circumference --      Peak Flow --      Pain Score 08/30/20 1743 0     Pain Loc --      Pain Edu? --      Excl. in [place]? --  No data  found.  Updated Vital Signs BP (!) 150/90 (BP Location: Right Arm)   Pulse 93   Temp 98 F (36.7 C) (Oral)   Resp 20   LMP  (LMP Unknown)   SpO2 96%   Visual Acuity Right Eye Distance:   Left Eye Distance:   Bilateral Distance:    Right Eye Near:   Left Eye Near:    Bilateral Near:     Physical Exam Constitutional:      General: She is not in acute distress.    Appearance: She is not ill-appearing or diaphoretic.  HENT:     Head: Normocephalic and atraumatic.     Right Ear: Tympanic membrane and ear canal normal.     Left Ear: Tympanic membrane and ear canal normal.     Mouth/Throat:     Mouth: Mucous membranes are moist.     Pharynx: Oropharynx is clear. Uvula midline. No oropharyngeal exudate, posterior oropharyngeal erythema or uvula swelling.     Comments: Mild voice hoarseness.  Trachea midline, negative JVD Eyes:     General: No scleral icterus.    Conjunctiva/sclera: Conjunctivae normal.     Pupils: Pupils are equal, round, and reactive to light.  Neck:     Comments: Trachea midline, negative JVD Cardiovascular:     Rate and Rhythm: Normal rate and regular rhythm.     Heart sounds: No murmur heard. No gallop.   Pulmonary:     Effort: Pulmonary effort is normal. No respiratory distress.     Breath sounds: No wheezing, rhonchi or rales.  Musculoskeletal:     Cervical back: Neck supple. No tenderness.  Lymphadenopathy:     Cervical: No cervical adenopathy.  Skin:    Capillary Refill: Capillary refill takes less than 2 seconds.     Coloration: Skin is not jaundiced or pale.     Findings: No rash.  Neurological:     General: No focal deficit present.     Mental Status: She is alert and oriented to person, place, and time.      UC Treatments / Results  Labs (all labs ordered are listed, but only abnormal results are displayed) Labs Reviewed  NOVEL CORONAVIRUS, NAA    EKG   Radiology No results found.  Procedures Procedures (including critical  care time)  Medications Ordered in UC Medications - No data to display  Initial Impression / Assessment and Plan / UC Course  I have reviewed the triage vital signs and the nursing notes.  Pertinent labs & imaging results that were available during my care of the patient were reviewed by me and considered in my medical decision making (see chart for details).     Patient afebrile, nontoxic, with SpO2 96%.  Covid PCR pending.  Patient to quarantine until results are back.  We will treat supportively as outlined below.  Will follow up with ENT should laryngitis persist past 2 weeks.  Return precautions discussed, patient verbalized understanding and is agreeable to plan. Final Clinical Impressions(s) / UC Diagnoses   Final diagnoses:  Encounter for screening for COVID-19  Laryngitis  URI with cough and congestion     Discharge Instructions     Your rapid strep test was negative today.  The culture is pending.  Please look on your MyChart for test results.   We will notify you if the culture positive and outline a treatment plan at that time.   Please continue Tylenol and/or Ibuprofen as needed for fever, pain.  May try warm salt water gargles, cepacol lozenges, throat spray, warm tea or water with lemon/honey, or OTC cold relief medicine for throat discomfort.   For congestion: take a daily anti-histamine like Zyrtec, Claritin, and a oral decongestant to help with post nasal drip that may be irritating your throat.   It is important to stay hydrated: drink plenty of fluids (primarily water) to keep your throat moisturized and help further relieve irritation/discomfort.     ED Prescriptions    Medication Sig Dispense Auth. Provider   benzonatate (TESSALON) 100 MG capsule Take 1 capsule (100 mg total) by mouth every 8 (eight) hours. 21 capsule Hall-Potvin, [place], PA-C     PDMP not reviewed this encounter.   Hall-Potvin, [place], [place] 08/30/20 1808

## 2020-08-30 NOTE — ED Triage Notes (Signed)
Patient states she has had a mild sore throat, nasal congestion, a mild cough since yesterday. Pt is also hoarse when speaking. Pt denies any other symptoms. Pt is aox4 and ambulatory.

## 2020-09-01 LAB — SARS-COV-2, NAA 2 DAY TAT

## 2020-09-01 LAB — NOVEL CORONAVIRUS, NAA: SARS-CoV-2, NAA: NOT DETECTED

## 2020-11-20 ENCOUNTER — Encounter: Payer: BC Managed Care – PPO | Admitting: Physical Medicine & Rehabilitation

## 2021-01-11 ENCOUNTER — Other Ambulatory Visit: Payer: Self-pay | Admitting: Neurological Surgery

## 2021-01-11 ENCOUNTER — Other Ambulatory Visit (HOSPITAL_COMMUNITY): Payer: Self-pay | Admitting: Neurological Surgery

## 2021-01-11 DIAGNOSIS — D352 Benign neoplasm of pituitary gland: Secondary | ICD-10-CM

## 2021-02-11 ENCOUNTER — Ambulatory Visit (HOSPITAL_COMMUNITY)
Admission: RE | Admit: 2021-02-11 | Discharge: 2021-02-11 | Disposition: A | Discharge status: Home / Self Care | Payer: BC Managed Care – PPO | Source: Ambulatory Visit | Attending: Neurological Surgery | Admitting: Neurological Surgery

## 2021-02-11 ENCOUNTER — Other Ambulatory Visit: Payer: Self-pay

## 2021-02-11 DIAGNOSIS — D352 Benign neoplasm of pituitary gland: Secondary | ICD-10-CM | POA: Diagnosis present

## 2021-02-11 MED ORDER — GADOBUTROL 1 MMOL/ML IV SOLN
7.0000 mL | Freq: Once | INTRAVENOUS | Status: AC | PRN
  Administered 2021-02-11: 7 mL via INTRAVENOUS

## 2021-02-25 ENCOUNTER — Ambulatory Visit: Payer: BC Managed Care – PPO | Admitting: Physical Medicine & Rehabilitation

## 2021-03-25 ENCOUNTER — Other Ambulatory Visit: Payer: Self-pay | Admitting: Family Medicine

## 2021-03-25 DIAGNOSIS — Z1231 Encounter for screening mammogram for malignant neoplasm of breast: Secondary | ICD-10-CM

## 2021-05-06 IMAGING — CT CT HEAD WITHOUT CONTRAST
4 series · 15 of 47 positions shown, 17 images · non-contrast
Comparison: 03/16/2019 maxillofacial CT

CLINICAL DATA: Head trauma and possible CSF leak

EXAM:
CT HEAD WITHOUT CONTRAST
TECHNIQUE: Contiguous axial images were obtained from the base of the skull
through the vertex without intravenous contrast.

[Series 3: head without · axial · non-contrast · 0.44mm/px · z∈[-100,+35]mm · 7 of 37 slices shown, 9 images]
[im 5/37  brain]
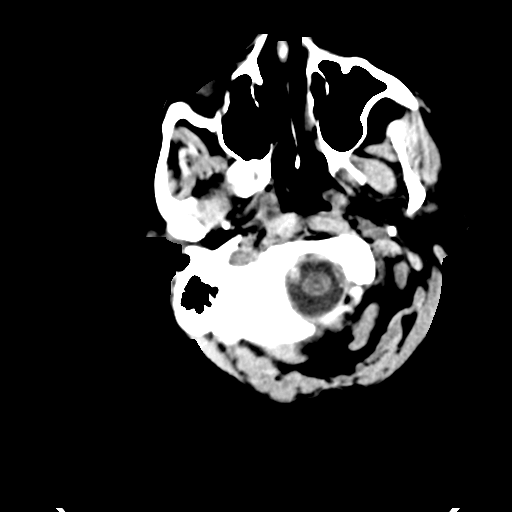
[im 5/37  bone]
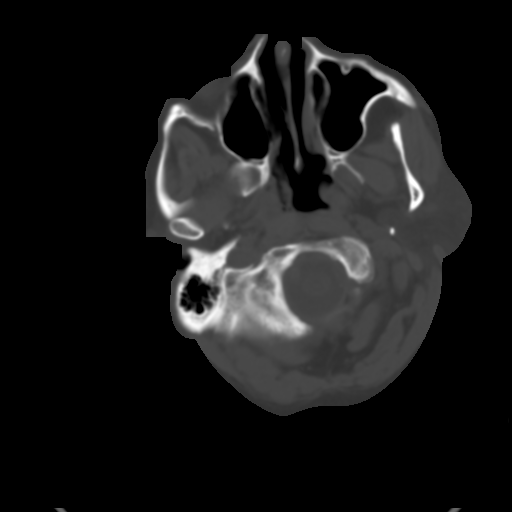
[im 10/37  brain]
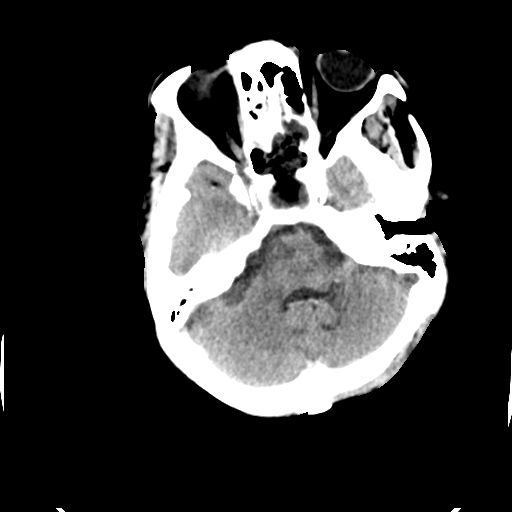
[im 14/37  brain]
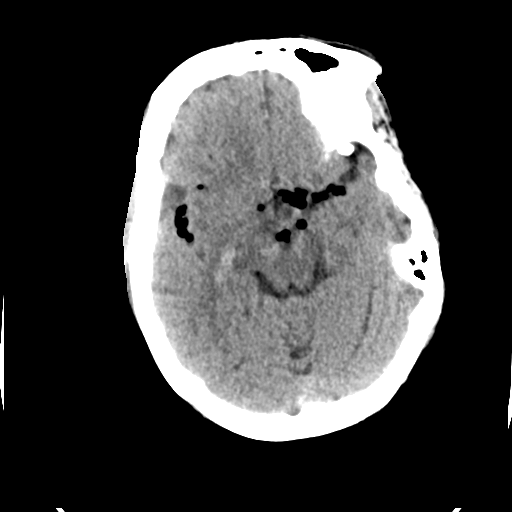
[im 19/37  brain]
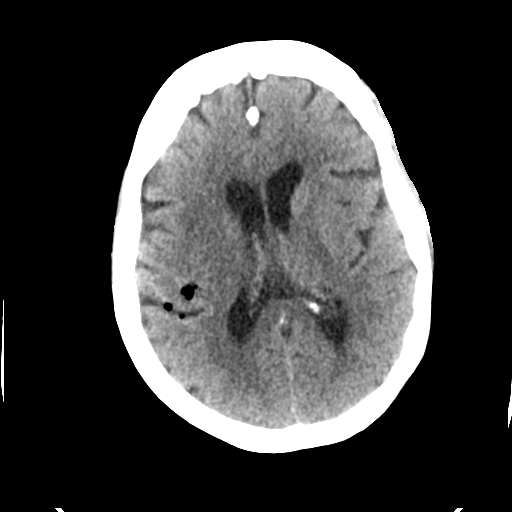
[im 23/37  brain]
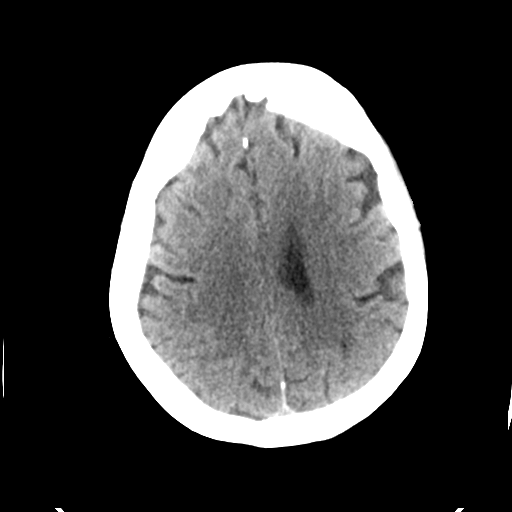
[im 23/37  bone]
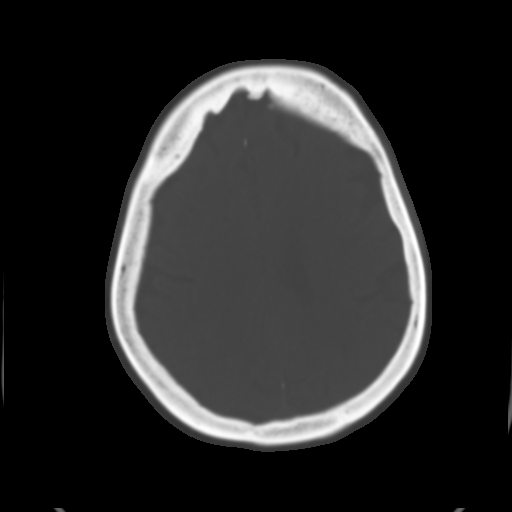
[im 28/37  brain]
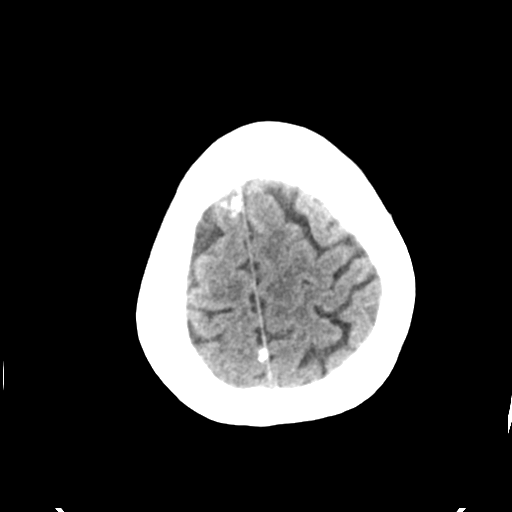
[im 32/37  brain]
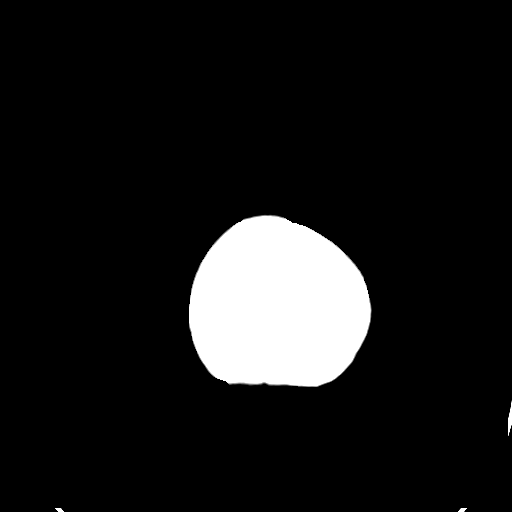

[Series 4: head bone · axial · 0.44mm/px · z∈[-102,-84]mm · 2 of 91 slices shown]
[im 10/91  bone]
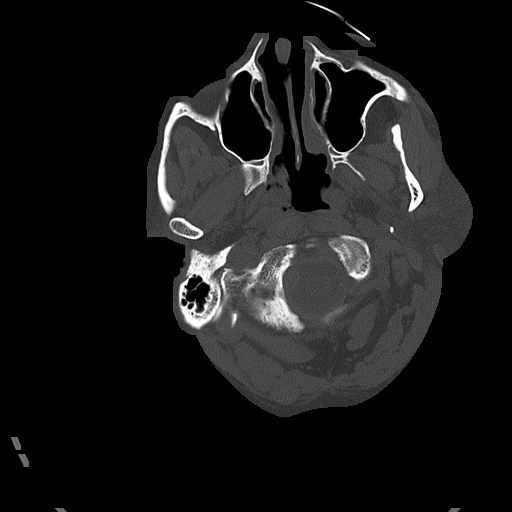
[im 19/91  bone]
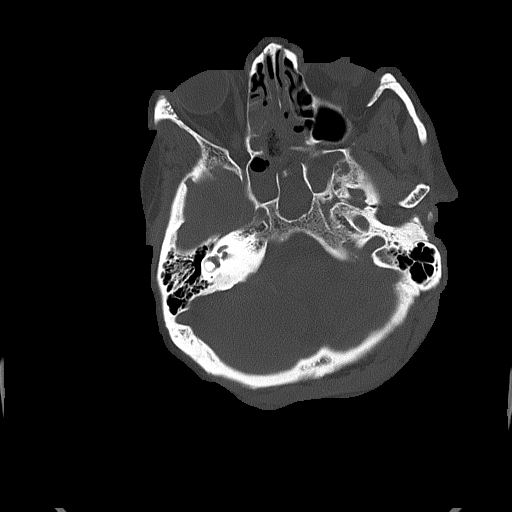

[Series 5: head without cor · coronal · non-contrast · 0.35mm/px · 3 of 72 slices shown]
[im 24/72  brain]
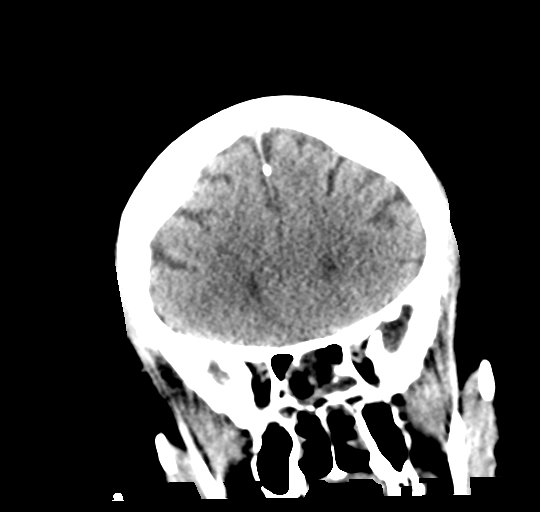
[im 32/72  brain]
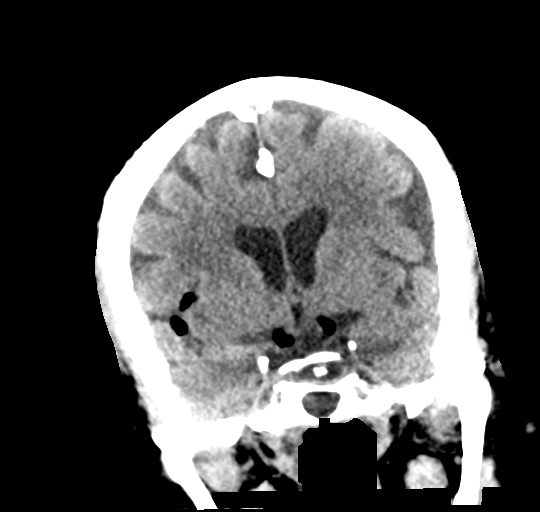
[im 40/72  brain]
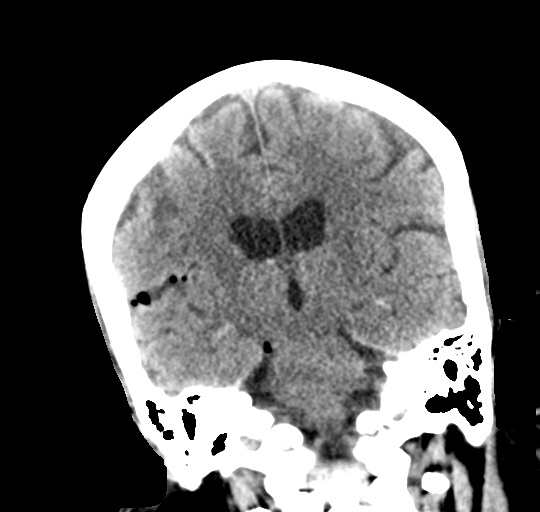

[Series 6: head without sag · sagittal · non-contrast · 0.35mm/px · 3 of 61 slices shown]
[im 21/61  brain]
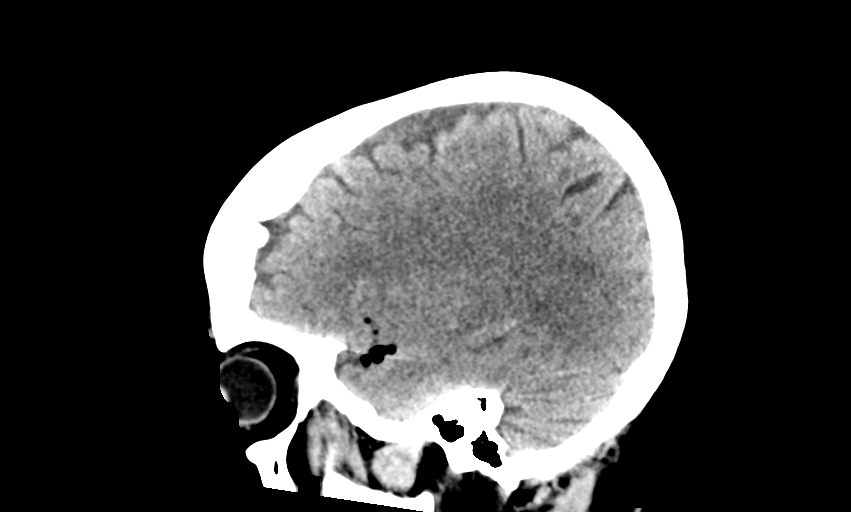
[im 31/61  brain]
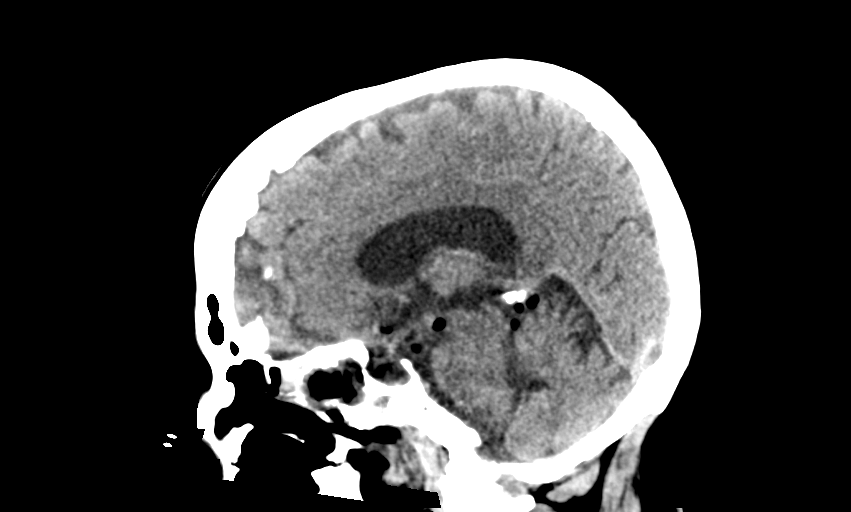
[im 41/61  brain]
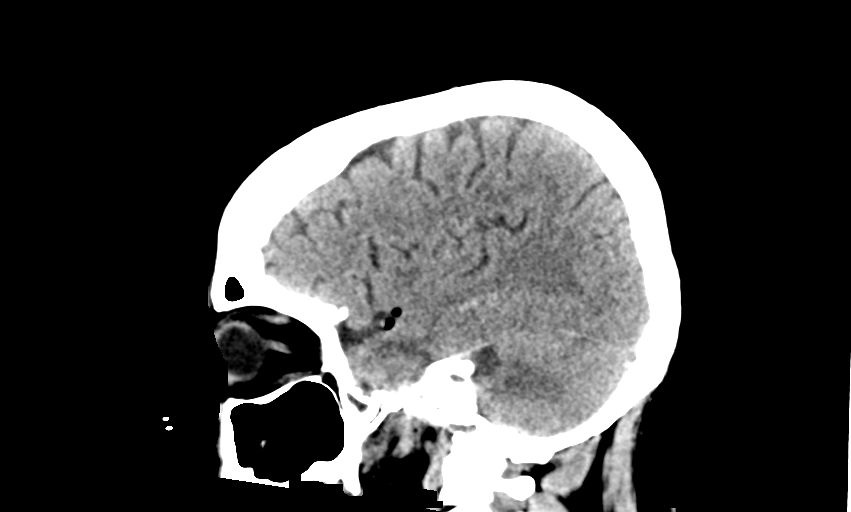

[15 of 47 positions shown; findings below may reference images not displayed]

FINDINGS: Brain: There is scattered pneumocephalus at the base of the
cerebrum. There is no intracranial hemorrhage. No focal parenchymal
abnormality of the brain. No midline shift or mass effect. No
hydrocephalus.

Vascular: Atherosclerotic calcification of the internal carotid
arteries at the skull base. No abnormal hyperdensity of the major
intracranial arteries or dural venous sinuses.

Skull: Status post trans-sphenoidal pituitary lesion resection with
large defect at the anterior aspect of the sella turcica. This is in
direct continuity with the sphenoid and posterior ethmoid sinuses
which are opacified.

Sinuses/Orbits: Opacification of the sphenoid and posterior ethmoid
sinuses. The orbits are normal.
IMPRESSION: 1. Moderate pneumocephalus without mass effect status post
trans-sphenoidal pituitary lesion resection.
2. Large defect in the anterior aspect of the sella turcica with
opacification of the sphenoid and posterior ethmoid sinuses.

## 2021-05-17 ENCOUNTER — Ambulatory Visit: Payer: BC Managed Care – PPO

## 2021-06-18 ENCOUNTER — Other Ambulatory Visit: Payer: Self-pay | Admitting: Nephrology

## 2021-06-18 DIAGNOSIS — N1832 Chronic kidney disease, stage 3b: Secondary | ICD-10-CM

## 2021-06-18 DIAGNOSIS — N179 Acute kidney failure, unspecified: Secondary | ICD-10-CM

## 2021-06-20 ENCOUNTER — Ambulatory Visit: Payer: BC Managed Care – PPO

## 2021-07-08 ENCOUNTER — Other Ambulatory Visit: Payer: Self-pay

## 2021-07-08 ENCOUNTER — Ambulatory Visit
Admission: RE | Admit: 2021-07-08 | Discharge: 2021-07-08 | Disposition: A | Discharge status: Home / Self Care | Payer: BC Managed Care – PPO | Source: Ambulatory Visit | Attending: Nephrology | Admitting: Nephrology

## 2021-07-08 ENCOUNTER — Ambulatory Visit
Admission: RE | Admit: 2021-07-08 | Discharge: 2021-07-08 | Disposition: A | Discharge status: Home / Self Care | Payer: BC Managed Care – PPO | Source: Ambulatory Visit | Attending: Family Medicine | Admitting: Family Medicine

## 2021-07-08 DIAGNOSIS — Z1231 Encounter for screening mammogram for malignant neoplasm of breast: Secondary | ICD-10-CM

## 2021-07-08 DIAGNOSIS — N1832 Chronic kidney disease, stage 3b: Secondary | ICD-10-CM

## 2021-07-08 DIAGNOSIS — N179 Acute kidney failure, unspecified: Secondary | ICD-10-CM

## 2022-01-20 ENCOUNTER — Other Ambulatory Visit: Payer: Self-pay | Admitting: Neurological Surgery

## 2022-01-20 ENCOUNTER — Other Ambulatory Visit (HOSPITAL_COMMUNITY): Payer: Self-pay | Admitting: Neurological Surgery

## 2022-01-20 DIAGNOSIS — D352 Benign neoplasm of pituitary gland: Secondary | ICD-10-CM

## 2022-02-10 ENCOUNTER — Ambulatory Visit (HOSPITAL_COMMUNITY)
Admission: RE | Admit: 2022-02-10 | Discharge: 2022-02-10 | Disposition: A | Discharge status: Home / Self Care | Payer: Medicare HMO | Source: Ambulatory Visit | Attending: Neurological Surgery | Admitting: Neurological Surgery

## 2022-02-10 DIAGNOSIS — D352 Benign neoplasm of pituitary gland: Secondary | ICD-10-CM | POA: Diagnosis present

## 2022-02-10 MED ORDER — GADOBUTROL 1 MMOL/ML IV SOLN
5.0000 mL | Freq: Once | INTRAVENOUS | Status: AC | PRN
  Administered 2022-02-10: 5 mL via INTRAVENOUS

## 2022-08-27 ENCOUNTER — Other Ambulatory Visit: Payer: Self-pay | Admitting: Family Medicine

## 2022-08-27 DIAGNOSIS — Z1231 Encounter for screening mammogram for malignant neoplasm of breast: Secondary | ICD-10-CM

## 2022-10-27 ENCOUNTER — Ambulatory Visit
Admission: RE | Admit: 2022-10-27 | Discharge: 2022-10-27 | Disposition: A | Discharge status: Home / Self Care | Payer: Medicare HMO | Source: Ambulatory Visit | Attending: Family Medicine | Admitting: Family Medicine

## 2022-10-27 DIAGNOSIS — Z1231 Encounter for screening mammogram for malignant neoplasm of breast: Secondary | ICD-10-CM

## 2023-10-05 ENCOUNTER — Other Ambulatory Visit: Payer: Self-pay | Admitting: Family Medicine

## 2023-10-05 DIAGNOSIS — Z1231 Encounter for screening mammogram for malignant neoplasm of breast: Secondary | ICD-10-CM

## 2023-11-02 ENCOUNTER — Ambulatory Visit
Admission: RE | Admit: 2023-11-02 | Discharge: 2023-11-02 | Disposition: A | Discharge status: Home / Self Care | Payer: Medicare HMO | Source: Ambulatory Visit | Attending: Family Medicine | Admitting: Family Medicine

## 2023-11-02 DIAGNOSIS — Z1231 Encounter for screening mammogram for malignant neoplasm of breast: Secondary | ICD-10-CM

## 2024-06-07 ENCOUNTER — Telehealth: Payer: Self-pay | Admitting: Neuroradiology

## 2024-06-07 NOTE — Telephone Encounter (Signed)
 Ultrasound Carotid images requested for the following dates of service: 06/06/24 05/25/23 01/01/23 05/26/22

## 2024-06-09 ENCOUNTER — Telehealth: Admitting: Neuroradiology

## 2024-06-09 ENCOUNTER — Inpatient Hospital Stay
Admission: RE | Admit: 2024-06-09 | Discharge: 2024-06-09 | Disposition: A | Discharge status: Home / Self Care | Payer: Self-pay | Source: Ambulatory Visit | Attending: Neuroradiology | Admitting: Neuroradiology

## 2024-06-09 ENCOUNTER — Other Ambulatory Visit: Payer: Self-pay

## 2024-06-09 DIAGNOSIS — Z049 Encounter for examination and observation for unspecified reason: Secondary | ICD-10-CM

## 2024-06-09 DIAGNOSIS — Z95828 Presence of other vascular implants and grafts: Secondary | ICD-10-CM

## 2024-06-09 DIAGNOSIS — I6523 Occlusion and stenosis of bilateral carotid arteries: Secondary | ICD-10-CM | POA: Diagnosis not present

## 2024-06-09 NOTE — Progress Notes (Signed)
 I had the pleasure of speaking with [name] today on a video visit.  The visit included both audio and video.  She is a patient of mine from [facility].  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  I have been following her since that time with ultrasound, and she has not had additional symptoms.  She saw my former partner Sheree Daring June 06, 2024 for routine office visit.  The ultrasound showed the velocities in the right carotid stent were 147/64.  Velocities in the left carotid were mildly elevated, but had a waveform with decreased pulsatility and retrograde flow in the left ECA.  This suggests a high-grade stenosis of the left common carotid artery proximally.  She does not have any symptoms of stroke, TIA or amaurosis fugax.  I reviewed her medications which include aspirin and clopidogrel.  She has a history of breast cancer which was treated with lumpectomy, lymph node dissection, chemotherapy and radiation.  She has a history of a pituitary adenoma which was removed by transsphenoidal pituitary resection and was complicated by a pituitary leak.  Assessment:  1.  Patent right carotid stent with no restenosis 2.  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  We will repeat an ultrasound and I will see her in the office in 1 year.

## 2024-06-09 NOTE — Telephone Encounter (Signed)
 STAT Request resent at 3:15pm.   05/06/2021: CT Angio Neck 06/01/2024: US  Carotid Bilateral 06/06/2024:US  Carotid Bilateral 05/26/2022: US  Carotid Bilateral  [facility] only sent one of the images requested to be sent on 06/07/2024 and today at 12:30pm that was originally requested by [facility].  06/06/24 05/25/23 01/01/23 05/26/22

## 2024-06-09 NOTE — Telephone Encounter (Signed)
 Call placed to [facility] Records. Explained that we need these STAT. Rep says that the request could take up to 72 business hours but that she would attempt to expedite the request.

## 2024-07-05 ENCOUNTER — Telehealth: Payer: Self-pay

## 2024-07-05 NOTE — Telephone Encounter (Signed)
 Called pt per Dr Lester to schedule appt. No answer, left vm for pt to return call
